# Patient Record
Sex: Male | Born: 1960 | Race: Black or African American | Hispanic: No | Smoking: Current every day smoker
Health system: Southern US, Community
[De-identification: ages and names within clinical notes are randomized; demographics above are authoritative.]

## PROBLEM LIST (undated history)

## (undated) DIAGNOSIS — I499 Cardiac arrhythmia, unspecified: Secondary | ICD-10-CM

## (undated) DIAGNOSIS — I513 Intracardiac thrombosis, not elsewhere classified: Secondary | ICD-10-CM

## (undated) DIAGNOSIS — I219 Acute myocardial infarction, unspecified: Secondary | ICD-10-CM

## (undated) DIAGNOSIS — F1010 Alcohol abuse, uncomplicated: Secondary | ICD-10-CM

## (undated) DIAGNOSIS — J449 Chronic obstructive pulmonary disease, unspecified: Secondary | ICD-10-CM

## (undated) DIAGNOSIS — Z5900 Homelessness unspecified: Secondary | ICD-10-CM

## (undated) DIAGNOSIS — I10 Essential (primary) hypertension: Secondary | ICD-10-CM

## (undated) DIAGNOSIS — J45909 Unspecified asthma, uncomplicated: Secondary | ICD-10-CM

## (undated) DIAGNOSIS — Z91199 Patient's noncompliance with other medical treatment and regimen due to unspecified reason: Secondary | ICD-10-CM

## (undated) DIAGNOSIS — N189 Chronic kidney disease, unspecified: Secondary | ICD-10-CM

## (undated) DIAGNOSIS — N183 Chronic kidney disease, stage 3 unspecified: Secondary | ICD-10-CM

## (undated) DIAGNOSIS — M1990 Unspecified osteoarthritis, unspecified site: Secondary | ICD-10-CM

## (undated) DIAGNOSIS — I4729 Other ventricular tachycardia: Secondary | ICD-10-CM

## (undated) DIAGNOSIS — F1410 Cocaine abuse, uncomplicated: Secondary | ICD-10-CM

## (undated) DIAGNOSIS — I272 Pulmonary hypertension, unspecified: Secondary | ICD-10-CM

## (undated) DIAGNOSIS — M792 Neuralgia and neuritis, unspecified: Secondary | ICD-10-CM

## (undated) DIAGNOSIS — F259 Schizoaffective disorder, unspecified: Secondary | ICD-10-CM

## (undated) DIAGNOSIS — I509 Heart failure, unspecified: Secondary | ICD-10-CM

## (undated) DIAGNOSIS — Z9119 Patient's noncompliance with other medical treatment and regimen: Secondary | ICD-10-CM

## (undated) DIAGNOSIS — I50810 Right heart failure, unspecified: Secondary | ICD-10-CM

## (undated) DIAGNOSIS — K219 Gastro-esophageal reflux disease without esophagitis: Secondary | ICD-10-CM

## (undated) DIAGNOSIS — I3139 Other pericardial effusion (noninflammatory): Secondary | ICD-10-CM

## (undated) DIAGNOSIS — I313 Pericardial effusion (noninflammatory): Secondary | ICD-10-CM

## (undated) DIAGNOSIS — M549 Dorsalgia, unspecified: Secondary | ICD-10-CM

## (undated) DIAGNOSIS — J40 Bronchitis, not specified as acute or chronic: Secondary | ICD-10-CM

## (undated) DIAGNOSIS — I472 Ventricular tachycardia: Secondary | ICD-10-CM

## (undated) DIAGNOSIS — F250 Schizoaffective disorder, bipolar type: Secondary | ICD-10-CM

## (undated) HISTORY — PX: OTHER SURGICAL HISTORY: SHX169

---

## 1999-07-02 ENCOUNTER — Emergency Department (HOSPITAL_COMMUNITY): Admission: EM | Admit: 1999-07-02 | Discharge: 1999-07-02 | Payer: Self-pay | Admitting: Emergency Medicine

## 2001-02-04 ENCOUNTER — Emergency Department (HOSPITAL_COMMUNITY): Admission: EM | Admit: 2001-02-04 | Discharge: 2001-02-04 | Payer: Self-pay

## 2007-09-30 ENCOUNTER — Emergency Department (HOSPITAL_COMMUNITY): Admission: EM | Admit: 2007-09-30 | Discharge: 2007-09-30 | Payer: Self-pay | Admitting: Emergency Medicine

## 2007-10-09 ENCOUNTER — Emergency Department (HOSPITAL_COMMUNITY): Admission: EM | Admit: 2007-10-09 | Discharge: 2007-10-09 | Payer: Self-pay | Admitting: Family Medicine

## 2008-01-16 ENCOUNTER — Emergency Department (HOSPITAL_COMMUNITY): Admission: EM | Admit: 2008-01-16 | Discharge: 2008-01-17 | Payer: Self-pay | Admitting: Emergency Medicine

## 2008-07-31 ENCOUNTER — Emergency Department (HOSPITAL_COMMUNITY): Admission: EM | Admit: 2008-07-31 | Discharge: 2008-07-31 | Payer: Self-pay | Admitting: Emergency Medicine

## 2009-10-01 ENCOUNTER — Emergency Department (HOSPITAL_COMMUNITY): Admission: EM | Admit: 2009-10-01 | Discharge: 2009-10-01 | Payer: Self-pay | Admitting: Family Medicine

## 2009-11-23 ENCOUNTER — Emergency Department (HOSPITAL_COMMUNITY): Admission: EM | Admit: 2009-11-23 | Discharge: 2009-11-23 | Payer: Self-pay | Admitting: Emergency Medicine

## 2010-07-21 LAB — PROTIME-INR: INR: 1 (ref 0.00–1.49)

## 2010-07-21 LAB — POCT I-STAT, CHEM 8
BUN: 14 mg/dL (ref 6–23)
Chloride: 104 mEq/L (ref 96–112)
Glucose, Bld: 90 mg/dL (ref 70–99)
HCT: 53 % — ABNORMAL HIGH (ref 39.0–52.0)
Hemoglobin: 18 g/dL — ABNORMAL HIGH (ref 13.0–17.0)
Potassium: 3.8 mEq/L (ref 3.5–5.1)
TCO2: 27 mmol/L (ref 0–100)

## 2010-07-21 LAB — COMPREHENSIVE METABOLIC PANEL
AST: 37 U/L (ref 0–37)
Calcium: 10 mg/dL (ref 8.4–10.5)
Chloride: 100 mEq/L (ref 96–112)
Creatinine, Ser: 1.3 mg/dL (ref 0.4–1.5)
GFR calc Af Amer: >60 mL/min (ref >60)
GFR calc non Af Amer: 59 mL/min — ABNORMAL LOW (ref >60)
Sodium: 139 mEq/L (ref 135–145)

## 2010-07-21 LAB — DIFFERENTIAL
Basophils Relative: 0 % (ref 0–1)
Eosinophils Absolute: 0.3 10*3/uL (ref 0.0–0.7)
Eosinophils Relative: 5 % (ref 0–5)
Lymphocytes Relative: 36 % (ref 12–46)
Lymphs Abs: 2.3 10*3/uL (ref 0.7–4.0)
Monocytes Relative: 7 % (ref 3–12)

## 2010-07-21 LAB — POCT CARDIAC MARKERS

## 2010-07-21 LAB — D-DIMER, QUANTITATIVE: D-Dimer, Quant: 0.33 ug/mL-FEU (ref 0.00–0.48)

## 2010-07-21 LAB — CBC
Hemoglobin: 16.8 g/dL (ref 13.0–17.0)
MCHC: 34.7 g/dL (ref 30.0–36.0)
MCV: 87.2 fL (ref 78.0–100.0)
RBC: 5.55 MIL/uL (ref 4.22–5.81)
RDW: 13.9 % (ref 11.5–15.5)

## 2011-01-06 LAB — DIFFERENTIAL
Basophils Absolute: 0
Eosinophils Absolute: 0.1
Lymphocytes Relative: 11 — ABNORMAL LOW
Lymphs Abs: 1.8
Monocytes Absolute: 1.6 — ABNORMAL HIGH
Monocytes Relative: 10
Neutrophils Relative %: 79 — ABNORMAL HIGH

## 2011-01-06 LAB — URINE CULTURE
Colony Count: NO GROWTH
Culture: NO GROWTH

## 2011-01-06 LAB — POCT URINALYSIS DIP (DEVICE)
Glucose, UA: NEGATIVE
Ketones, ur: NEGATIVE
Operator id: 270961
pH: 7

## 2011-01-06 LAB — POCT I-STAT, CHEM 8
HCT: 52
Potassium: 4.1
Sodium: 136

## 2011-01-06 LAB — CBC
HCT: 47.9
MCHC: 33.3
RDW: 13.5
WBC: 17 — ABNORMAL HIGH

## 2011-01-10 LAB — POCT I-STAT, CHEM 8
BUN: 8
Glucose, Bld: 82
HCT: 52

## 2012-01-31 ENCOUNTER — Encounter (HOSPITAL_COMMUNITY): Payer: Self-pay | Admitting: Adult Health

## 2012-01-31 ENCOUNTER — Emergency Department (HOSPITAL_COMMUNITY): Payer: Self-pay

## 2012-01-31 ENCOUNTER — Emergency Department (HOSPITAL_COMMUNITY)
Admission: EM | Admit: 2012-01-31 | Discharge: 2012-01-31 | Disposition: A | Discharge status: Home / Self Care | Payer: Self-pay | Attending: Emergency Medicine | Admitting: Emergency Medicine

## 2012-01-31 DIAGNOSIS — R319 Hematuria, unspecified: Secondary | ICD-10-CM | POA: Insufficient documentation

## 2012-01-31 DIAGNOSIS — F17200 Nicotine dependence, unspecified, uncomplicated: Secondary | ICD-10-CM | POA: Insufficient documentation

## 2012-01-31 DIAGNOSIS — R079 Chest pain, unspecified: Secondary | ICD-10-CM | POA: Insufficient documentation

## 2012-01-31 DIAGNOSIS — R109 Unspecified abdominal pain: Secondary | ICD-10-CM | POA: Insufficient documentation

## 2012-01-31 HISTORY — DX: Cardiac arrhythmia, unspecified: I49.9

## 2012-01-31 LAB — HEPATIC FUNCTION PANEL
ALT: 22 U/L (ref 0–53)
Albumin: 3.9 g/dL (ref 3.5–5.2)
Alkaline Phosphatase: 57 U/L (ref 39–117)
Total Protein: 6.9 g/dL (ref 6.0–8.3)

## 2012-01-31 LAB — URINALYSIS, ROUTINE W REFLEX MICROSCOPIC
Bilirubin Urine: NEGATIVE
Nitrite: NEGATIVE
Specific Gravity, Urine: 1.024 (ref 1.005–1.030)
pH: 6.5 (ref 5.0–8.0)

## 2012-01-31 LAB — BASIC METABOLIC PANEL
BUN: 15 mg/dL (ref 6–23)
GFR calc Af Amer: 70 mL/min — ABNORMAL LOW (ref >90)
GFR calc non Af Amer: 60 mL/min — ABNORMAL LOW (ref >90)
Glucose, Bld: 93 mg/dL (ref 70–99)
Sodium: 140 mEq/L (ref 135–145)

## 2012-01-31 LAB — CBC
HCT: 42.6 % (ref 39.0–52.0)
Hemoglobin: 15 g/dL (ref 13.0–17.0)
MCH: 30.8 pg (ref 26.0–34.0)
MCHC: 35.2 g/dL (ref 30.0–36.0)
MCV: 87.5 fL (ref 78.0–100.0)
Platelets: 191 10*3/uL (ref 150–400)
WBC: 5.9 10*3/uL (ref 4.0–10.5)

## 2012-01-31 LAB — LIPASE, BLOOD: Lipase: 31 U/L (ref 11–59)

## 2012-01-31 LAB — URINE MICROSCOPIC-ADD ON

## 2012-01-31 LAB — POCT I-STAT TROPONIN I: Troponin i, poc: 0.04 ng/mL (ref 0.00–0.08)

## 2012-01-31 MED ORDER — ASPIRIN 325 MG PO TABS
325.0000 mg | ORAL_TABLET | ORAL | Status: AC
  Administered 2012-01-31: 325 mg via ORAL
  Filled 2012-01-31: qty 1

## 2012-01-31 MED ORDER — POLYETHYLENE GLYCOL 3350 17 GM/SCOOP PO POWD: 17.0000 g | Freq: Every day | ORAL | Status: DC

## 2012-01-31 MED ORDER — GI COCKTAIL ~~LOC~~
30.0000 mL | Freq: Once | ORAL | Status: AC
  Administered 2012-01-31: 30 mL via ORAL
  Filled 2012-01-31: qty 30

## 2012-01-31 MED ORDER — NITROGLYCERIN 0.4 MG SL SUBL
0.4000 mg | SUBLINGUAL_TABLET | SUBLINGUAL | Status: DC | PRN
  Administered 2012-01-31: 0.4 mg via SUBLINGUAL
  Filled 2012-01-31: qty 75

## 2012-01-31 NOTE — ED Notes (Signed)
Reports chest pain associated with SOB, bilateral hand numbness described as tightness. Ongoing for a "long time" also c/o pain in left side. Chest pain is worse with exertion.

## 2012-01-31 NOTE — ED Provider Notes (Signed)
Medical screening examination/treatment/procedure(s) were conducted as a shared visit with non-physician practitioner(s) and myself.  I personally evaluated the patient during the encounter.  Pt with ongoing symptoms for some time, although abd pain is main reason for visit.  No signs of cardiac ischemia, no specific findings for abd pain, though patient noted to have large stool burden on CT scan.  Will refer to pcm locally, treat constipation.  Pt advised to stop using cocaine.  Olivia Mackie, MD 01/31/12 [phone]

## 2012-01-31 NOTE — ED Provider Notes (Signed)
History     CSN: [id number]  Arrival date & time 01/31/12  0234   First MD Initiated Contact with Patient 01/31/12 0239      Chief Complaint  Patient presents with  . Abdominal Pain  . Chest Pain   HPI  History provided by the patient. Patient is a 51 year old male with history of hypertension who presents with complaints of abdominal pain. Patient reports to me this is a pain reason for coming this evening to the emergency room. He also complains of chest pains that are episodic and chronic for more than one year. This evening patient states in addition to his similar episodic chronic chest pains he has pain across his upper abdomen and left lower abdomen area. Pain is worse in LLQ and can be very sharp.  These pains are more recent and also intermittent for the past 3 weeks. Patient does not associate any particular activity with pain. he has not used any treatments for pain. Pain is described as an ache and burning. Patient denies any associated nausea vomiting diarrhea or constipation. He denies any urinary changes. No dysuria, hematuria, urinary frequency or penile discharge.  Chest pain is described as brief intermittent pressures and sharp pains in the sternal and left chest area. Symptoms last only seconds. Patient states that when he feels these he sometimes gets up and moves around to help them go away. Patient reports that he was formally on diltiazem and other blood pressure medicines but has stopped taking these for the past year. Patient also admits to regular cocaine use and states that he smokes cocaine earlier today. He does not associate chest or abdominal pains with cocaine use and states that he has these even when he is not using drugs. Symptoms are occasionally accompanied with shortness of breath. He denies any heart palpitations, lightheadedness, near syncope, diaphoresis or nausea.    Past Medical History  Diagnosis Date  . Irregular heart beat     History  reviewed. No pertinent past surgical history.  History reviewed. No pertinent family history.  History  Substance Use Topics  . Smoking status: Current Every Day Smoker  . Smokeless tobacco: Not on file  . Alcohol Use: Yes      Review of Systems  Constitutional: Negative for fever, chills and diaphoresis.  Respiratory: Positive for shortness of breath. Negative for cough.   Cardiovascular: Positive for chest pain. Negative for palpitations and leg swelling.  Gastrointestinal: Positive for abdominal pain. Negative for nausea, vomiting, diarrhea, constipation and blood in stool.  Genitourinary: Negative for dysuria, frequency, hematuria, flank pain and discharge.  Musculoskeletal: Negative for back pain.  Skin: Negative for rash.  Neurological: Negative for light-headedness and headaches.    Allergies  Review of patient's allergies indicates no known allergies.  Home Medications  No current outpatient prescriptions on file.  BP 142/84  Pulse 85  Temp 97.8 F (36.6 C) (Oral)  Resp 16  SpO2 95%  Physical Exam  Nursing note and vitals reviewed. Constitutional: He is oriented to person, place, and time. He appears well-developed and well-nourished. No distress.  HENT:  Head: Normocephalic.  Cardiovascular: Normal rate and regular rhythm.   No murmur heard. Pulmonary/Chest: Effort normal and breath sounds normal. No respiratory distress. He has no wheezes. He has no rales. He exhibits no tenderness.  Abdominal: Soft. There is tenderness in the right upper quadrant, epigastric area, left upper quadrant and left lower quadrant. There is no rigidity, no rebound, no guarding, no CVA tenderness  and negative Murphy's sign.       Tenderness is mild slightly worse in LLQ.  No peritoneal signs.  Musculoskeletal: Normal range of motion. He exhibits no edema and no tenderness.  Neurological: He is alert and oriented to person, place, and time.  Skin: Skin is warm and dry. He is not  diaphoretic.  Psychiatric: He has a normal mood and affect. His behavior is normal.    ED Course  Procedures  Results for orders placed during the hospital encounter of 01/31/12  CBC      Component Value Range   WBC 5.9  4.0 - 10.5 K/uL   RBC 4.87  4.22 - 5.81 MIL/uL   Hemoglobin 15.0  13.0 - 17.0 g/dL   HCT 21.3  08.6 - 57.8 %   MCV 87.5  78.0 - 100.0 fL   MCH 30.8  26.0 - 34.0 pg   MCHC 35.2  30.0 - 36.0 g/dL   RDW 46.9  62.9 - 52.8 %   Platelets 191  150 - 400 K/uL  BASIC METABOLIC PANEL      Component Value Range   Sodium 140  135 - 145 mEq/L   Potassium 3.9  3.5 - 5.1 mEq/L   Chloride 100  96 - 112 mEq/L   CO2 28  19 - 32 mEq/L   Glucose, Bld 93  70 - 99 mg/dL   BUN 15  6 - 23 mg/dL   Creatinine, Ser 4.13  0.50 - 1.35 mg/dL   Calcium 9.2  8.4 - 24.4 mg/dL   GFR calc non Af Amer 60 (*) >90 mL/min   GFR calc Af Amer 70 (*) >90 mL/min  POCT I-STAT TROPONIN I      Component Value Range   Troponin i, poc 0.02  0.00 - 0.08 ng/mL   Comment 3           HEPATIC FUNCTION PANEL      Component Value Range   Total Protein 6.9  6.0 - 8.3 g/dL   Albumin 3.9  3.5 - 5.2 g/dL   AST 28  0 - 37 U/L   ALT 22  0 - 53 U/L   Alkaline Phosphatase 57  39 - 117 U/L   Total Bilirubin 0.6  0.3 - 1.2 mg/dL   Bilirubin, Direct 0.2  0.0 - 0.3 mg/dL   Indirect Bilirubin 0.4  0.3 - 0.9 mg/dL  LIPASE, BLOOD      Component Value Range   Lipase 31  11 - 59 U/L  URINALYSIS, ROUTINE W REFLEX MICROSCOPIC      Component Value Range   Color, Urine YELLOW  YELLOW   APPearance CLEAR  CLEAR   Specific Gravity, Urine 1.024  1.005 - 1.030   pH 6.5  5.0 - 8.0   Glucose, UA NEGATIVE  NEGATIVE mg/dL   Hgb urine dipstick MODERATE (*) NEGATIVE   Bilirubin Urine NEGATIVE  NEGATIVE   Ketones, ur NEGATIVE  NEGATIVE mg/dL   Protein, ur NEGATIVE  NEGATIVE mg/dL   Urobilinogen, UA 1.0  0.0 - 1.0 mg/dL   Nitrite NEGATIVE  NEGATIVE   Leukocytes, UA NEGATIVE  NEGATIVE  URINE MICROSCOPIC-ADD ON      Component  Value Range   RBC / HPF 7-10  <3 RBC/hpf    Second troponin negative at 0.04 ng/mL    Dg Chest [facility] 1 View  01/31/2012  *RADIOLOGY REPORT*  Clinical Data: Chest pain  PORTABLE CHEST - 1 VIEW  Comparison: 07/31/2008  Findings:  Degraded by rotation/left extremity positioning.  The tip of the apices are excluded from the exam.  Allowing for this, lungs are clear. No pleural effusion or pneumothorax. The cardiomediastinal contours are within normal limits. The visualized bones and soft tissues are without significant appreciable abnormality.  IMPRESSION: No radiographic evidence of acute cardiopulmonary process.   Original Report Authenticated By: Waneta Martins, M.D.      1. Abdominal pain   2. Hematuria   3. Chest pain       MDM   patient seen and evaluated. Patient resting comfortably in no acute distress. Symptoms have been intermittent and ongoing prolonged period of time. Patient offered medications for comfort and declined.  Pt continues to be resting well.  Labs have been unremarkable.  Pt does have slight hematuria.  On re- questioning pt does reports occasional left flank pains.  No prior hx of kidney stone.      Date: 01/31/2012  Rate: 88  Rhythm: normal sinus rhythm  QRS Axis: normal  Intervals: normal  ST/T Wave abnormalities: nonspecific T wave changes  Conduction Disutrbances:none  Narrative Interpretation:   Old EKG Reviewed: unchanged from 07/31/2008       Angus Seller, PA 01/31/12 [phone]

## 2012-05-08 ENCOUNTER — Encounter (HOSPITAL_COMMUNITY): Payer: Self-pay

## 2012-05-08 ENCOUNTER — Emergency Department (INDEPENDENT_AMBULATORY_CARE_PROVIDER_SITE_OTHER)
Admission: EM | Admit: 2012-05-08 | Discharge: 2012-05-08 | Disposition: A | Discharge status: Home / Self Care | Payer: Self-pay | Source: Home / Self Care | Attending: Family Medicine | Admitting: Family Medicine

## 2012-05-08 DIAGNOSIS — M479 Spondylosis, unspecified: Secondary | ICD-10-CM

## 2012-05-08 MED ORDER — KETOROLAC TROMETHAMINE 30 MG/ML IJ SOLN
INTRAMUSCULAR | Status: AC
  Filled 2012-05-08: qty 1

## 2012-05-08 MED ORDER — KETOROLAC TROMETHAMINE 30 MG/ML IJ SOLN
30.0000 mg | Freq: Once | INTRAMUSCULAR | Status: AC
  Administered 2012-05-08: 30 mg via INTRAMUSCULAR

## 2012-05-08 MED ORDER — MELOXICAM 7.5 MG PO TABS: 7.5000 mg | ORAL_TABLET | Freq: Two times a day (BID) | ORAL | Status: DC

## 2012-05-08 NOTE — ED Provider Notes (Signed)
History     CSN: [id number]  Arrival date & time 05/08/12  1314   First MD Initiated Contact with Patient 05/08/12 1416      Chief Complaint  Patient presents with  . Back Pain    (Consider location/radiation/quality/duration/timing/severity/associated sxs/prior treatment) Patient is a 52 y.o. male presenting with back pain. The history is provided by the patient.  Back Pain  This is a chronic problem. The current episode started more than 2 days ago. The problem has been gradually worsening. The pain is associated with no known injury. The pain is present in the lumbar spine. The pain is moderate. The symptoms are aggravated by bending and certain positions. Pertinent negatives include no dysuria and no pelvic pain.    Past Medical History  Diagnosis Date  . Irregular heart beat     History reviewed. No pertinent past surgical history.  History reviewed. No pertinent family history.  History  Substance Use Topics  . Smoking status: Current Every Day Smoker  . Smokeless tobacco: Not on file  . Alcohol Use: Yes      Review of Systems  Constitutional: Negative.   Gastrointestinal: Negative.   Genitourinary: Negative.  Negative for dysuria and pelvic pain.  Musculoskeletal: Positive for back pain. Negative for myalgias, joint swelling and gait problem.    Allergies  Review of patient's allergies indicates no known allergies.  Home Medications   Current Outpatient Rx  Name  Route  Sig  Dispense  Refill  . ALBUTEROL SULFATE HFA 108 (90 BASE) MCG/ACT IN AERS   Inhalation   Inhale 2 puffs into the lungs every 6 (six) hours as needed.         Marland Kitchen DICLOFENAC SODIUM 75 MG PO TBEC   Oral   Take 75 mg by mouth 2 (two) times daily.         Marland Kitchen DILTIAZEM HCL ER 180 MG PO CP24   Oral   Take 180 mg by mouth daily.         Marland Kitchen HYDROCHLOROTHIAZIDE 25 MG PO TABS   Oral   Take 25 mg by mouth daily.         Marland Kitchen LORATADINE 10 MG PO TBDP   Oral   Take 10 mg by mouth  daily.         . MELOXICAM 7.5 MG PO TABS   Oral   Take 1 tablet (7.5 mg total) by mouth 2 (two) times daily. For back pain   30 tablet   1   . POLYETHYLENE GLYCOL 3350 PO POWD   Oral   Take 17 g by mouth daily.   255 g   0   . RANITIDINE HCL 75 MG PO TABS   Oral   Take 75 mg by mouth 2 (two) times daily.           BP 120/67  Pulse 72  Temp 97.6 F (36.4 C) (Oral)  Resp 16  SpO2 100%  Physical Exam  Nursing note and vitals reviewed. Constitutional: He is oriented to person, place, and time. He appears well-developed and well-nourished.  Abdominal: Soft. Bowel sounds are normal.  Musculoskeletal: He exhibits tenderness.       Lumbar back: He exhibits decreased range of motion, tenderness, bony tenderness, pain and spasm.  Neurological: He is alert and oriented to person, place, and time.  Skin: Skin is warm and dry.    ED Course  Procedures (including critical care time)  Labs Reviewed - No data to  display No results found.   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

## 2012-05-08 NOTE — ED Notes (Signed)
Ongoing problems w back, worse recently

## 2014-11-06 ENCOUNTER — Emergency Department (HOSPITAL_COMMUNITY): Payer: Self-pay

## 2014-11-06 ENCOUNTER — Encounter (HOSPITAL_COMMUNITY): Payer: Self-pay | Admitting: Vascular Surgery

## 2014-11-06 ENCOUNTER — Emergency Department (HOSPITAL_COMMUNITY)
Admission: EM | Admit: 2014-11-06 | Discharge: 2014-11-07 | Disposition: A | Discharge status: Home / Self Care | Payer: Self-pay | Attending: Emergency Medicine | Admitting: Emergency Medicine

## 2014-11-06 DIAGNOSIS — M25569 Pain in unspecified knee: Secondary | ICD-10-CM

## 2014-11-06 DIAGNOSIS — M542 Cervicalgia: Secondary | ICD-10-CM | POA: Insufficient documentation

## 2014-11-06 DIAGNOSIS — M25552 Pain in left hip: Secondary | ICD-10-CM | POA: Insufficient documentation

## 2014-11-06 DIAGNOSIS — M25561 Pain in right knee: Secondary | ICD-10-CM | POA: Insufficient documentation

## 2014-11-06 DIAGNOSIS — M25559 Pain in unspecified hip: Secondary | ICD-10-CM

## 2014-11-06 DIAGNOSIS — R2243 Localized swelling, mass and lump, lower limb, bilateral: Secondary | ICD-10-CM | POA: Insufficient documentation

## 2014-11-06 DIAGNOSIS — M792 Neuralgia and neuritis, unspecified: Secondary | ICD-10-CM | POA: Insufficient documentation

## 2014-11-06 DIAGNOSIS — M25512 Pain in left shoulder: Secondary | ICD-10-CM | POA: Insufficient documentation

## 2014-11-06 DIAGNOSIS — I2510 Atherosclerotic heart disease of native coronary artery without angina pectoris: Secondary | ICD-10-CM | POA: Insufficient documentation

## 2014-11-06 DIAGNOSIS — M545 Low back pain: Secondary | ICD-10-CM | POA: Insufficient documentation

## 2014-11-06 DIAGNOSIS — G8929 Other chronic pain: Secondary | ICD-10-CM | POA: Insufficient documentation

## 2014-11-06 DIAGNOSIS — M25551 Pain in right hip: Secondary | ICD-10-CM | POA: Insufficient documentation

## 2014-11-06 DIAGNOSIS — M1990 Unspecified osteoarthritis, unspecified site: Secondary | ICD-10-CM | POA: Insufficient documentation

## 2014-11-06 DIAGNOSIS — M25562 Pain in left knee: Secondary | ICD-10-CM | POA: Insufficient documentation

## 2014-11-06 DIAGNOSIS — J441 Chronic obstructive pulmonary disease with (acute) exacerbation: Secondary | ICD-10-CM | POA: Insufficient documentation

## 2014-11-06 DIAGNOSIS — I10 Essential (primary) hypertension: Secondary | ICD-10-CM | POA: Insufficient documentation

## 2014-11-06 DIAGNOSIS — Z79899 Other long term (current) drug therapy: Secondary | ICD-10-CM | POA: Insufficient documentation

## 2014-11-06 DIAGNOSIS — Z720 Tobacco use: Secondary | ICD-10-CM | POA: Insufficient documentation

## 2014-11-06 DIAGNOSIS — Z8719 Personal history of other diseases of the digestive system: Secondary | ICD-10-CM | POA: Insufficient documentation

## 2014-11-06 HISTORY — DX: Unspecified osteoarthritis, unspecified site: M19.90

## 2014-11-06 HISTORY — DX: Chronic obstructive pulmonary disease, unspecified: J44.9

## 2014-11-06 HISTORY — DX: Essential (primary) hypertension: I10

## 2014-11-06 HISTORY — DX: Unspecified asthma, uncomplicated: J45.909

## 2014-11-06 HISTORY — DX: Dorsalgia, unspecified: M54.9

## 2014-11-06 HISTORY — DX: Bronchitis, not specified as acute or chronic: J40

## 2014-11-06 HISTORY — DX: Neuralgia and neuritis, unspecified: M79.2

## 2014-11-06 HISTORY — DX: Gastro-esophageal reflux disease without esophagitis: K21.9

## 2014-11-06 MED ORDER — ALBUTEROL SULFATE (2.5 MG/3ML) 0.083% IN NEBU
5.0000 mg | INHALATION_SOLUTION | Freq: Once | RESPIRATORY_TRACT | Status: AC
  Administered 2014-11-06: 5 mg via RESPIRATORY_TRACT

## 2014-11-06 MED ORDER — ALBUTEROL SULFATE (2.5 MG/3ML) 0.083% IN NEBU
INHALATION_SOLUTION | RESPIRATORY_TRACT | Status: AC
  Filled 2014-11-06: qty 3

## 2014-11-06 NOTE — ED Notes (Signed)
Pt reports to the ED for eval of lower back pain, bilateral knee pain, and bilateral toe tingling. Pt also reports left shoulder pain. He reports that when he turns his head to the left a sharp pain will shoot up his left neck and head. He reports he does not have cartilage in his knees. Slight swelling noted bilaterally. Pt has a hx of DDD. Denies any hx of DM. Pt reports the SOB occurs with walking and at rest. He reports he is on 2 inhalers but they are not helping. Pt denies any CP at this time. Pt A&Ox4, resp e/u, and skin warm and dry.

## 2014-11-07 MED ORDER — OXYCODONE-ACETAMINOPHEN 5-325 MG PO TABS
1.0000 | ORAL_TABLET | Freq: Once | ORAL | Status: AC
  Administered 2014-11-07: 1 via ORAL
  Filled 2014-11-07: qty 1

## 2014-11-07 MED ORDER — OXYCODONE-ACETAMINOPHEN 5-325 MG PO TABS: 1.0000 | ORAL_TABLET | ORAL | Status: DC | PRN

## 2014-11-07 NOTE — ED Provider Notes (Signed)
CSN: [id number]     Arrival date & time 11/06/14  2049 History   First MD Initiated Contact with Patient 11/06/14 2342     Chief Complaint  Patient presents with  . Back Pain  . Shortness of Breath     (Consider location/radiation/quality/duration/timing/severity/associated sxs/prior Treatment) Patient is a 54 y.o. male presenting with back pain and shortness of breath. The history is provided by the patient. No language interpreter was used.  Back Pain Location:  Lumbar spine Quality:  Aching Radiates to:  L foot and R foot Pain severity:  Moderate Pain is:  Same all the time Onset quality:  Gradual Associated symptoms: no abdominal pain, no fever, no numbness and no weakness   Associated symptoms comment:  Pain in lower back radiating into both feet. No new injury. He is also having pain and swelling in bilateral knees, "arthritis". He is also having pain in the left shoulder, starting at the left cervical area. No swelling or redness. He denies fever, nausea, vomiting, weakness or numbness. He is being treated by his PCP with neurontin but states this does not help. All pain complaints are chronic and nothing new tonight. He states he just needed something more for pain. Shortness of Breath Associated symptoms: no abdominal pain, no fever and no vomiting     Past Medical History  Diagnosis Date  . Irregular heart beat   . Acid reflux   . Hypertension   . COPD (chronic obstructive pulmonary disease)   . Arthritis   . Neuropathic pain   . Coronary artery disease   . Asthma   . Bronchitis   . Back pain    History reviewed. No pertinent past surgical history. No family history on file. History  Substance Use Topics  . Smoking status: Current Every Day Smoker  . Smokeless tobacco: Not on file  . Alcohol Use: Yes    Review of Systems  Constitutional: Negative for fever and chills.  Respiratory: Positive for shortness of breath.   Cardiovascular: Negative.    Gastrointestinal: Negative.  Negative for nausea, vomiting and abdominal pain.  Genitourinary: Negative.  Negative for enuresis.  Musculoskeletal: Positive for back pain, joint swelling and arthralgias.  Skin: Negative.  Negative for wound.  Neurological: Negative.  Negative for weakness and numbness.      Allergies  Review of patient's allergies indicates no known allergies.  Home Medications   Prior to Admission medications   Medication Sig Start Date End Date Taking? Authorizing Provider  benztropine (COGENTIN) 1 MG tablet Take 1 mg by mouth at bedtime.   Yes Historical Provider, MD  citalopram (CELEXA) 20 MG tablet Take 20 mg by mouth daily.   Yes Historical Provider, MD  colchicine 0.6 MG tablet Take 0.6 mg by mouth daily.   Yes Historical Provider, MD  gabapentin (NEURONTIN) 800 MG tablet Take 800 mg by mouth at bedtime.   Yes Historical Provider, MD  paliperidone (INVEGA) 6 MG 24 hr tablet Take 6 mg by mouth daily.   Yes Historical Provider, MD  traZODone (DESYREL) 100 MG tablet Take 100 mg by mouth 2 (two) times daily.   Yes Historical Provider, MD  meloxicam (MOBIC) 7.5 MG tablet Take 1 tablet (7.5 mg total) by mouth 2 (two) times daily. For back pain Patient not taking: Reported on 11/07/2014 05/08/12   Linna Hoff, MD  polyethylene glycol powder (GLYCOLAX/MIRALAX) powder Take 17 g by mouth daily. Patient not taking: Reported on 11/07/2014 01/31/12   Ivonne Andrew, PA-C  BP 151/71 mmHg  Pulse 72  Temp(Src) 98.5 F (36.9 C) (Oral)  Resp 16  SpO2 97% Physical Exam  Constitutional: He is oriented to person, place, and time. He appears well-developed and well-nourished.  HENT:  Head: Normocephalic.  Neck: Normal range of motion. Neck supple.  Cardiovascular: Normal rate and regular rhythm.   Pulmonary/Chest: Effort normal and breath sounds normal.  Abdominal: Soft. Bowel sounds are normal. There is no tenderness. There is no rebound and no guarding.  Musculoskeletal:  Normal range of motion.  No midline cervical tenderness. There is mild left paracervical into left superior shoulder tenderness without swelling or redness. Bilateral knee swelling without effusion. Joints stable. No redness. Lower back tender across lumbar area without swelling. Ambulatory without difficulty.   Neurological: He is alert and oriented to person, place, and time.  Skin: Skin is warm and dry. No rash noted.  Psychiatric: He has a normal mood and affect.    ED Course  Procedures (including critical care time) Labs Review Labs Reviewed - No data to display  Imaging Review Dg Chest 2 View  11/06/2014   CLINICAL DATA:  A shortness of breath and hypertension  EXAM: CHEST  2 VIEW  COMPARISON:  January 31, 2012  FINDINGS: The heart size and mediastinal contours are within normal limits. There is no focal infiltrate, pulmonary edema, or pleural effusion. The visualized skeletal structures are unremarkable.  IMPRESSION: No active cardiopulmonary disease.   Electronically Signed   By: Sherian Rein M.D.   On: 11/06/2014 21:35     EKG Interpretation None      MDM   Final diagnoses:  None    1. Arthralgias 2. History of arthritis  The patient is well appearing. No evidence to suggest septic joints or new process. He is encouraged to follow up with PCP to discuss pain management.   The patient reports, on re-evaluation, that he has had a cough. CXR without acute finding. No hypoxia. Lungs without abnormal breath sounds. Stable.     Elpidio Anis, PA-C 11/07/14 0036  Dione Booze, MD 11/07/14 [phone]

## 2014-11-07 NOTE — Discharge Instructions (Signed)

## 2014-11-12 ENCOUNTER — Ambulatory Visit: Payer: Self-pay | Admitting: Cardiology

## 2014-11-19 ENCOUNTER — Encounter: Payer: Self-pay | Admitting: Cardiology

## 2014-11-19 ENCOUNTER — Ambulatory Visit: Payer: Self-pay | Attending: Cardiology | Admitting: Cardiology

## 2014-11-19 VITALS — BP 138/83 | HR 83 | Temp 97.9°F | Resp 18 | Ht 71.0 in | Wt 208.4 lb

## 2014-11-19 DIAGNOSIS — R062 Wheezing: Secondary | ICD-10-CM | POA: Insufficient documentation

## 2014-11-19 DIAGNOSIS — I10 Essential (primary) hypertension: Secondary | ICD-10-CM | POA: Insufficient documentation

## 2014-11-19 DIAGNOSIS — R05 Cough: Secondary | ICD-10-CM | POA: Insufficient documentation

## 2014-11-19 DIAGNOSIS — F17200 Nicotine dependence, unspecified, uncomplicated: Secondary | ICD-10-CM | POA: Insufficient documentation

## 2014-11-19 DIAGNOSIS — I25119 Atherosclerotic heart disease of native coronary artery with unspecified angina pectoris: Secondary | ICD-10-CM | POA: Insufficient documentation

## 2014-11-19 DIAGNOSIS — Z720 Tobacco use: Secondary | ICD-10-CM | POA: Insufficient documentation

## 2014-11-19 NOTE — Patient Instructions (Signed)
Thank you for coming in to see Dr. Daleen Squibb today.  Please start taking Aspirin 81mg  daily. Stop smoking. Keep taking nitroglycerin as discussed.

## 2014-11-19 NOTE — Assessment & Plan Note (Signed)
With his multiple risk factors especially heavy smoking and symptoms consistent with angina he most likely has coronary artery disease. I've asked him to take aspirin 81 mg a day, quit smoking altogether, and how to take nitroglycerin if he has recurrent symptoms. He knows that if he takes 3 nitroglycerin with no relief to call 911. Secondary preventative risk factors need to be addressed. I'll arrange him to come back and be put in the internal medicine clinic.

## 2014-11-19 NOTE — Progress Notes (Signed)
Patient referred by ED for SOB. Patient reports pain today located in lower back and knees. Pain rated at a 10, described as "just pain and aching to the bone." Pain is constant.   Patient has not taken any medications today.   Patient smokes .5 packs/cigs daily.   Patient has been having chest pain, SOB, wheezing, and swelling in ankles, top of feet, knees and back. SOB is present at rest and when active. Patient reports wheezing is always present. Patient reports he is on an albuterol inhaler and qvar but does not know the dosage.

## 2014-11-19 NOTE — Progress Notes (Signed)
Allen Mayer is referred by the emergency room for the evaluation of the regular heartbeats. I reviewed his EKG and other records and I do not see any evidence of this. [facility] is [facility] that he had heart problems and irregular heartbeat. He denies any syncope. He does have occasional chest tightness with some tingling in his left arm. He is a very difficult historian.  He's been told to take 81 mg of aspirin per day and quit smoking. He is doing neither. He does have an occasional pinching sensation in his chest but no palpitations.  His EKG in the emergency room showed normal sinus rhythm with nonspecific ST segment changes and borderline LVH.  His exam today shows me no acute distress. He's got a chronic cough with productive clear sputum. He is wheezing audibly. Vital signs are recorded. Neck shows no JVD with no carotid bruits. Heart reveals a soft S1-S2 without obvious murmur or gallop. Lungs reveal inspiratory expiratory rhonchi. They clear somewhat with cough. Abdominal exam is soft with good bowel sounds. Extremities reveal good pulses and no edema.

## 2014-11-19 NOTE — Assessment & Plan Note (Signed)
Strongly advised to quit smoking. I've told him that he is at significant risk of having a myocardial infarction if he continues to smoke.

## 2014-11-19 NOTE — Assessment & Plan Note (Signed)
Patient will continue his lisinopril HCTZ. He needs to stop smoking. We'll schedule him for follow-up in the general medical clinic. We'll obtain a comprehensive metabolic profile and fasting lipids.

## 2015-02-01 ENCOUNTER — Emergency Department (HOSPITAL_COMMUNITY): Payer: Self-pay

## 2015-02-01 ENCOUNTER — Emergency Department (HOSPITAL_COMMUNITY)
Admission: EM | Admit: 2015-02-01 | Discharge: 2015-02-02 | Disposition: A | Discharge status: Home / Self Care | Payer: Self-pay | Attending: Emergency Medicine | Admitting: Emergency Medicine

## 2015-02-01 ENCOUNTER — Encounter (HOSPITAL_COMMUNITY): Payer: Self-pay | Admitting: Emergency Medicine

## 2015-02-01 DIAGNOSIS — Y998 Other external cause status: Secondary | ICD-10-CM | POA: Insufficient documentation

## 2015-02-01 DIAGNOSIS — S8991XA Unspecified injury of right lower leg, initial encounter: Secondary | ICD-10-CM | POA: Insufficient documentation

## 2015-02-01 DIAGNOSIS — Z79899 Other long term (current) drug therapy: Secondary | ICD-10-CM | POA: Insufficient documentation

## 2015-02-01 DIAGNOSIS — Z720 Tobacco use: Secondary | ICD-10-CM | POA: Insufficient documentation

## 2015-02-01 DIAGNOSIS — K219 Gastro-esophageal reflux disease without esophagitis: Secondary | ICD-10-CM | POA: Insufficient documentation

## 2015-02-01 DIAGNOSIS — K5900 Constipation, unspecified: Secondary | ICD-10-CM | POA: Insufficient documentation

## 2015-02-01 DIAGNOSIS — I2510 Atherosclerotic heart disease of native coronary artery without angina pectoris: Secondary | ICD-10-CM | POA: Insufficient documentation

## 2015-02-01 DIAGNOSIS — Y9389 Activity, other specified: Secondary | ICD-10-CM | POA: Insufficient documentation

## 2015-02-01 DIAGNOSIS — J449 Chronic obstructive pulmonary disease, unspecified: Secondary | ICD-10-CM | POA: Insufficient documentation

## 2015-02-01 DIAGNOSIS — I10 Essential (primary) hypertension: Secondary | ICD-10-CM | POA: Insufficient documentation

## 2015-02-01 DIAGNOSIS — R140 Abdominal distension (gaseous): Secondary | ICD-10-CM | POA: Insufficient documentation

## 2015-02-01 DIAGNOSIS — Y9289 Other specified places as the place of occurrence of the external cause: Secondary | ICD-10-CM | POA: Insufficient documentation

## 2015-02-01 DIAGNOSIS — S8992XA Unspecified injury of left lower leg, initial encounter: Secondary | ICD-10-CM | POA: Insufficient documentation

## 2015-02-01 DIAGNOSIS — W1839XA Other fall on same level, initial encounter: Secondary | ICD-10-CM | POA: Insufficient documentation

## 2015-02-01 DIAGNOSIS — M1990 Unspecified osteoarthritis, unspecified site: Secondary | ICD-10-CM | POA: Insufficient documentation

## 2015-02-01 LAB — CBC WITH DIFFERENTIAL/PLATELET
Basophils Absolute: 0 10*3/uL (ref 0.0–0.1)
Basophils Relative: 0 %
EOS ABS: 0.2 10*3/uL (ref 0.0–0.7)
EOS PCT: 4 %
HCT: 44 % (ref 39.0–52.0)
Hemoglobin: 14.7 g/dL (ref 13.0–17.0)
LYMPHS ABS: 2.3 10*3/uL (ref 0.7–4.0)
Lymphocytes Relative: 37 %
MCH: 30.1 pg (ref 26.0–34.0)
MCHC: 33.4 g/dL (ref 30.0–36.0)
MCV: 90 fL (ref 78.0–100.0)
MONO ABS: 0.6 10*3/uL (ref 0.1–1.0)
MONOS PCT: 10 %
Neutro Abs: 3.1 10*3/uL (ref 1.7–7.7)
Neutrophils Relative %: 49 %
PLATELETS: 216 10*3/uL (ref 150–400)
RBC: 4.89 MIL/uL (ref 4.22–5.81)
RDW: 13.8 % (ref 11.5–15.5)
WBC: 6.3 10*3/uL (ref 4.0–10.5)

## 2015-02-01 LAB — COMPREHENSIVE METABOLIC PANEL
ALT: 35 U/L (ref 17–63)
ANION GAP: 7 (ref 5–15)
AST: 34 U/L (ref 15–41)
Albumin: 4.1 g/dL (ref 3.5–5.0)
Alkaline Phosphatase: 58 U/L (ref 38–126)
BUN: 17 mg/dL (ref 6–20)
CHLORIDE: 102 mmol/L (ref 101–111)
CO2: 25 mmol/L (ref 22–32)
Calcium: 9.2 mg/dL (ref 8.9–10.3)
Creatinine, Ser: 1.1 mg/dL (ref 0.61–1.24)
Glucose, Bld: 143 mg/dL — ABNORMAL HIGH (ref 65–99)
POTASSIUM: 4 mmol/L (ref 3.5–5.1)
SODIUM: 134 mmol/L — AB (ref 135–145)
Total Bilirubin: 0.3 mg/dL (ref 0.3–1.2)
Total Protein: 7.3 g/dL (ref 6.5–8.1)

## 2015-02-01 LAB — LIPASE, BLOOD: LIPASE: 27 U/L (ref 11–51)

## 2015-02-01 MED ORDER — TRAMADOL HCL 50 MG PO TABS: 50.0000 mg | ORAL_TABLET | Freq: Four times a day (QID) | ORAL | Status: DC | PRN

## 2015-02-01 MED ORDER — POLYETHYLENE GLYCOL 3350 17 GM/SCOOP PO POWD: 1.0000 | Freq: Once | ORAL | Status: DC

## 2015-02-01 MED ORDER — FENTANYL CITRATE (PF) 100 MCG/2ML IJ SOLN
50.0000 ug | Freq: Once | INTRAMUSCULAR | Status: AC
Start: 2015-02-01 — End: 2015-02-01
  Administered 2015-02-01: 50 ug via INTRAVENOUS
  Filled 2015-02-01: qty 2

## 2015-02-01 MED ORDER — FLEET ENEMA 7-19 GM/118ML RE ENEM
1.0000 | ENEMA | Freq: Once | RECTAL | Status: AC
  Administered 2015-02-01: 1 via RECTAL
  Filled 2015-02-01: qty 1

## 2015-02-01 NOTE — ED Notes (Signed)
Bed: WA20 Expected date:  Expected time:  Means of arrival:  Comments: 29M abd pain nvd

## 2015-02-01 NOTE — ED Notes (Signed)
Pt with abdominal pain and c/o constipation. Had bowel movement at 2015 tonight, pt denies vomiting or fever. Pt states he had a fall today and injured R shoulder, c/o R shoulder pain.

## 2015-02-01 NOTE — ED Provider Notes (Signed)
CSN: [id number]     Arrival date & time 02/01/15  2122 History   First MD Initiated Contact with Patient 02/01/15 2123     Chief Complaint  Patient presents with  . Abdominal Pain  . Constipation     (Consider location/radiation/quality/duration/timing/severity/associated sxs/prior Treatment) The history is provided by the patient and a friend. No language interpreter was used.   Allen Mayer is a 54 year old male with a history of hypertension, acid reflux, COPD, CAD, asthma, and back pain who presents for gradual onset worsening abdominal pain and constipation since yesterday. He states it began after he ate chitlins, collard greens, and chicken. He states he has felt like going to the bathroom all day but has no had a significant bowel movement.  He tried to eat this morning but stated his abdomen burned. About 2 hours ago he stated that he had a tiny bowel movement. His friend who is at bedside states that his belly appears bigger than normal. He admits to drinking 3 beers and having 3 shots of vodka last night. He states he fell last night but denies any loss of consciousness or head injury. He states he hit his right shoulder and is also complaining of bilateral knee pain. He is currently living in a homeless shelter. He denies any fever, chills, chest pain, shortness of breath, nausea, vomiting, diarrhea, dysuria, hematuria.   Past Medical History  Diagnosis Date  . Irregular heart beat   . Acid reflux   . Hypertension   . COPD (chronic obstructive pulmonary disease) (HCC)   . Arthritis   . Neuropathic pain   . Coronary artery disease   . Asthma   . Bronchitis   . Back pain    History reviewed. No pertinent past surgical history. History reviewed. No pertinent family history. Social History  Substance Use Topics  . Smoking status: Current Every Day Smoker -- 0.50 packs/day  . Smokeless tobacco: None  . Alcohol Use: No    Review of Systems  Constitutional: Negative for  fever.  Gastrointestinal: Positive for abdominal pain, constipation and abdominal distention. Negative for nausea, vomiting, diarrhea and blood in stool.  Musculoskeletal: Positive for myalgias and arthralgias.  All other systems reviewed and are negative.     Allergies  Review of patient's allergies indicates no known allergies.  Home Medications   Prior to Admission medications   Medication Sig Start Date End Date Taking? Authorizing Provider  benztropine (COGENTIN) 1 MG tablet Take 1 mg by mouth at bedtime.   Yes Historical Provider, MD  citalopram (CELEXA) 20 MG tablet Take 20 mg by mouth daily.   Yes Historical Provider, MD  esomeprazole (NEXIUM) 40 MG capsule Take 40 mg by mouth daily at 12 noon.   Yes Historical Provider, MD  gabapentin (NEURONTIN) 800 MG tablet Take 800 mg by mouth at bedtime.   Yes Historical Provider, MD  lisinopril-hydrochlorothiazide (PRINZIDE,ZESTORETIC) 20-25 MG per tablet Take 1 tablet by mouth daily.   Yes Historical Provider, MD  nitroGLYCERIN (NITROSTAT) 0.4 MG SL tablet Place 0.4 mg under the tongue every 5 (five) minutes as needed for chest pain.   Yes Historical Provider, MD  paliperidone (INVEGA) 6 MG 24 hr tablet Take 6 mg by mouth daily.   Yes Historical Provider, MD  traZODone (DESYREL) 100 MG tablet Take 100 mg by mouth 2 (two) times daily.   Yes Historical Provider, MD  meloxicam (MOBIC) 7.5 MG tablet Take 1 tablet (7.5 mg total) by mouth 2 (two) times daily.  For back pain Patient not taking: Reported on 11/07/2014 05/08/12   Linna Hoff, MD  oxyCODONE-acetaminophen (PERCOCET/ROXICET) 5-325 MG per tablet Take 1-2 tablets by mouth every 4 (four) hours as needed for severe pain. Patient not taking: Reported on 02/01/2015 11/07/14   Elpidio Anis, PA-C  polyethylene glycol powder (MIRALAX) powder Take 255 g by mouth once. 02/01/15   Kenika Sahm Patel-Mills, PA-C  traMADol (ULTRAM) 50 MG tablet Take 1 tablet (50 mg total) by mouth every 6 (six) hours as  needed. 02/01/15   Issa Kosmicki Patel-Mills, PA-C   BP 118/78 mmHg  Pulse 66  Temp(Src) 97.9 F (36.6 C) (Oral)  Resp 20  SpO2 100% Physical Exam  Constitutional: He is oriented to person, place, and time. He appears well-developed and well-nourished.  HENT:  Head: Normocephalic and atraumatic.  Eyes: Conjunctivae are normal.  Neck: Normal range of motion. Neck supple.  Cardiovascular: Normal rate, regular rhythm and normal heart sounds.   Pulmonary/Chest: Effort normal and breath sounds normal.  Abdominal: Soft. Normal appearance. He exhibits no distension. There is tenderness in the left upper quadrant and left lower quadrant. There is no rebound and no guarding.  Tenderness to palpation of the left upper and lower quadrant. No guarding or rebound. No abdominal distention.  Musculoskeletal: Normal range of motion.  Able to move all extremities without difficulty. 2+ radial pulses bilaterally. Full ROM.  Bilateral knees are without erythema, edema, or ecchymosis.  Neurological: He is alert and oriented to person, place, and time.  Skin: Skin is warm and dry.  Nursing note and vitals reviewed.   ED Course  Procedures (including critical care time) Labs Review Labs Reviewed  COMPREHENSIVE METABOLIC PANEL - Abnormal; Notable for the following:    Sodium 134 (*)    Glucose, Bld 143 (*)    All other components within normal limits  CBC WITH DIFFERENTIAL/PLATELET  LIPASE, BLOOD    Imaging Review Dg Abd 1 View  02/01/2015  CLINICAL DATA:  Acute onset of left-sided and lower abdominal pain. Constipation. Initial encounter. EXAM: ABDOMEN - 1 VIEW COMPARISON:  CT of the abdomen and pelvis from 01/31/2012 FINDINGS: The visualized bowel gas pattern is unremarkable. Scattered air and stool filled loops of colon are seen; no abnormal dilatation of small bowel loops is seen to suggest small bowel obstruction. No free intra-abdominal air is identified, though evaluation for free air is limited on  a single supine view. Mild degenerative change is noted at the lower lumbar spine; the sacroiliac joints are unremarkable in appearance. IMPRESSION: Unremarkable bowel gas pattern; no free intra-abdominal air seen. Moderate to large amount of stool noted in the colon, raising concern for mild constipation. Electronically Signed   By: Roanna Raider M.D.   On: 02/01/2015 22:01   I have personally reviewed and evaluated these images and lab results as part of my medical decision-making.   EKG Interpretation None      MDM   Final diagnoses:  Constipation, unspecified constipation type  Patient presents for abdominal pain and constipation. He also states that he has bilateral knee pain and right shoulder pain after fall that occurred yesterday but without loss of consciousness or head injury. I believe his knee and shoulder pain are chronic as seen from previous notes. I do not believe he needs imaging at this time. He is ambulatory. I reviewed the [place] knee rules. He states in the past he has had x-rays done which showed arthritis.  Labs are unremarkable. Abdominal x-ray shows moderate to  large amount of stool in the colon which most likely indicates constipation. Normal bowel gas pattern. Patient was given an enema. He had 2 small bowel movements. Patient was prescribed MiraLAX. He states that when he was at [facility] 2 months ago for joint pain he was given Percocet and is requesting that today. I discussed that I would not be giving him up for chronic pain. He states that ibuprofen and tylenol no longer work for his pain. He was given 8 tramadol. I explained that we do not treat chronic pain and that he would need to follow up with a pcp. I gave the patient return precautions and he verbally agrees with the plan.      Catha Gosselin, PA-C 02/01/15 2347  Donnetta Hutching, MD 02/02/15 [phone]

## 2015-02-01 NOTE — Discharge Instructions (Signed)
Constipation, Adult Follow-up with her primary care physician. Take MiraLAX daily. Constipation is when a person has fewer than three bowel movements a week, has difficulty having a bowel movement, or has stools that are dry, hard, or larger than normal. As people grow older, constipation is more common. A low-fiber diet, not taking in enough fluids, and taking certain medicines may make constipation worse.  CAUSES   Certain medicines, such as antidepressants, pain medicine, iron supplements, antacids, and water pills.   Certain diseases, such as diabetes, irritable bowel syndrome (IBS), thyroid disease, or depression.   Not drinking enough water.   Not eating enough fiber-rich foods.   Stress or travel.   Lack of physical activity or exercise.   Ignoring the urge to have a bowel movement.   Using laxatives too much.  SIGNS AND SYMPTOMS   Having fewer than three bowel movements a week.   Straining to have a bowel movement.   Having stools that are hard, dry, or larger than normal.   Feeling full or bloated.   Pain in the lower abdomen.   Not feeling relief after having a bowel movement.  DIAGNOSIS  Your health care provider will take a medical history and perform a physical exam. Further testing may be done for severe constipation. Some tests may include:  A barium enema X-ray to examine your rectum, colon, and, sometimes, your small intestine.   A sigmoidoscopy to examine your lower colon.   A colonoscopy to examine your entire colon. TREATMENT  Treatment will depend on the severity of your constipation and what is causing it. Some dietary treatments include drinking more fluids and eating more fiber-rich foods. Lifestyle treatments may include regular exercise. If these diet and lifestyle recommendations do not help, your health care provider may recommend taking over-the-counter laxative medicines to help you have bowel movements. Prescription medicines  may be prescribed if over-the-counter medicines do not work.  HOME CARE INSTRUCTIONS   Eat foods that have a lot of fiber, such as fruits, vegetables, whole grains, and beans.  Limit foods high in fat and processed sugars, such as french fries, hamburgers, cookies, candies, and soda.   A fiber supplement may be added to your diet if you cannot get enough fiber from foods.   Drink enough fluids to keep your urine clear or pale yellow.   Exercise regularly or as directed by your health care provider.   Go to the restroom when you have the urge to go. Do not hold it.   Only take over-the-counter or prescription medicines as directed by your health care provider. Do not take other medicines for constipation without talking to your health care provider first.  SEEK IMMEDIATE MEDICAL CARE IF:   You have bright red blood in your stool.   Your constipation lasts for more than 4 days or gets worse.   You have abdominal or rectal pain.   You have thin, pencil-like stools.   You have unexplained weight loss. MAKE SURE YOU:   Understand these instructions.  Will watch your condition.  Will get help right away if you are not doing well or get worse.   This information is not intended to replace advice given to you by your health care provider. Make sure you discuss any questions you have with your health care provider.   Document Released: 12/25/2003 Document Revised: 04/18/2014 Document Reviewed: 01/07/2013 Elsevier Interactive Patient Education [employer]

## 2015-04-07 DIAGNOSIS — Z139 Encounter for screening, unspecified: Secondary | ICD-10-CM

## 2015-04-16 NOTE — Congregational Nurse Program (Signed)
Congregational Nurse Program Note  Date of Encounter: 04/07/2015  Past Medical History: No past medical history on file.  Encounter Details:     CNP Questionnaire - 04/07/15 1539    Patient Demographics   Is this a new or existing patient? New   Patient is considered a/an Not Applicable   Race American Indian/[place] Native   Patient Assistance   Location of Patient Assistance Not Applicable   Patient's financial/insurance status Orange Card/[employer] Connects   Uninsured Patient Yes   Interventions Counseled to make appt. with provider   Patient referred to apply for the following financial assistance Not Applicable   Food insecurities addressed Provided food supplies   Transportation assistance No   Assistance securing medications No   Educational health offerings Chronic disease   Encounter Details   Primary purpose of visit Chronic Illness/Condition Visit   Patient referred to Clinic   Was a mental health screening completed? (GAINS tool) No   Does patient have dental issues? No   Since previous encounter, have you referred patient for abnormal blood pressure that resulted in a new diagnosis or medication change? No   Since previous encounter, have you referred patient for abnormal blood glucose that resulted in a new diagnosis or medication change? No   For Abstraction Use Only   Does patient have insurance? No       Clinic visit for B/P check.  States is in chronic pain and wants a note for bedrest.  Request was denied.  Requesting pain medications.  States sees Chales Abrahams Placey at the [facility].  Encouraged him to make an appointment with her for pain management follow up.

## 2015-05-01 DIAGNOSIS — Z139 Encounter for screening, unspecified: Secondary | ICD-10-CM

## 2015-05-08 NOTE — Congregational Nurse Program (Signed)
Congregational Nurse Program Note  Date of Encounter: 05/01/2015  Past Medical History: No past medical history on file.  Encounter Details:     CNP Questionnaire - 04/30/15 1306    Patient Demographics   Is this a new or existing patient? New   Patient is considered a/an Not Applicable   Race American Indian/[place] Native   Patient Assistance   Location of Patient Assistance Not Applicable   Patient's financial/insurance status Orange Card/Care Connects   Uninsured Patient Yes   Interventions Counseled to make appt. with provider   Patient referred to apply for the following financial assistance Not Applicable   Food insecurities addressed Provided food supplies   Transportation assistance No   Assistance securing medications No   Educational health offerings Chronic disease;Hypertension   Encounter Details   Primary purpose of visit Chronic Illness/Condition Visit;Education/Health Concerns   Was an Emergency Department visit averted? Not Applicable   Does patient have a medical provider? Yes   Patient referred to Clinic   Was a mental health screening completed? (GAINS tool) No   Does patient have dental issues? No   Since previous encounter, have you referred patient for abnormal blood pressure that resulted in a new diagnosis or medication change? No   Since previous encounter, have you referred patient for abnormal blood glucose that resulted in a new diagnosis or medication change? No   For Abstraction Use Only   Does patient have insurance? No       Clinic visit for B/P check.  Is seeing Lavinia Sharps NP at the [facility].  Instructed client to return to clinic next week to re-check B/P.  Client is a smoker.  Discussed with him the need to at least begin cutting down.  Discussed impact of smoking on his B/P

## 2015-05-26 DIAGNOSIS — Z139 Encounter for screening, unspecified: Secondary | ICD-10-CM

## 2015-06-03 NOTE — Congregational Nurse Program (Signed)
Congregational Nurse Program Note  Date of Encounter: 05/26/2015  Past Medical History: Past Medical History  Diagnosis Date  . Irregular heart beat   . Acid reflux   . Hypertension   . COPD (chronic obstructive pulmonary disease) (HCC)   . Arthritis   . Neuropathic pain   . Coronary artery disease   . Asthma   . Bronchitis   . Back pain     Encounter Details:     CNP Questionnaire - 05/26/15 2020    Patient Demographics   Is this a new or existing patient? Existing   Patient is considered a/an Not Applicable   Race American Indian/[place] Native   Patient Assistance   Location of Patient Assistance Not Applicable   Patient's financial/insurance status [employer] Card/Care Connects   Uninsured Patient Yes   Interventions Counseled to make appt. with provider   Patient referred to apply for the following financial assistance Not Applicable   Food insecurities addressed Provided food supplies   Transportation assistance No   Assistance securing medications No   Educational health offerings Chronic disease;Hypertension   Encounter Details   Primary purpose of visit Chronic Illness/Condition Visit;Education/Health Concerns   Was an Emergency Department visit averted? Not Applicable   Does patient have a medical provider? Yes   Patient referred to Clinic   Was a mental health screening completed? (GAINS tool) No   Does patient have dental issues? No   Does patient have vision issues? No   Since previous encounter, have you referred patient for abnormal blood pressure that resulted in a new diagnosis or medication change? No   Since previous encounter, have you referred patient for abnormal blood glucose that resulted in a new diagnosis or medication change? No   For Abstraction Use Only   Does patient have insurance? No     B/P check

## 2015-06-16 ENCOUNTER — Encounter (HOSPITAL_COMMUNITY): Payer: Self-pay | Admitting: Emergency Medicine

## 2015-06-16 ENCOUNTER — Emergency Department (HOSPITAL_COMMUNITY): Payer: Medicaid Other

## 2015-06-16 ENCOUNTER — Emergency Department (HOSPITAL_COMMUNITY)
Admission: EM | Admit: 2015-06-16 | Discharge: 2015-06-16 | Disposition: A | Discharge status: Home / Self Care | Payer: Medicaid Other | Attending: Emergency Medicine | Admitting: Emergency Medicine

## 2015-06-16 DIAGNOSIS — M792 Neuralgia and neuritis, unspecified: Secondary | ICD-10-CM | POA: Insufficient documentation

## 2015-06-16 DIAGNOSIS — I2510 Atherosclerotic heart disease of native coronary artery without angina pectoris: Secondary | ICD-10-CM | POA: Insufficient documentation

## 2015-06-16 DIAGNOSIS — F17200 Nicotine dependence, unspecified, uncomplicated: Secondary | ICD-10-CM | POA: Insufficient documentation

## 2015-06-16 DIAGNOSIS — R0602 Shortness of breath: Secondary | ICD-10-CM | POA: Diagnosis present

## 2015-06-16 DIAGNOSIS — K219 Gastro-esophageal reflux disease without esophagitis: Secondary | ICD-10-CM | POA: Insufficient documentation

## 2015-06-16 DIAGNOSIS — Z79899 Other long term (current) drug therapy: Secondary | ICD-10-CM | POA: Insufficient documentation

## 2015-06-16 DIAGNOSIS — J441 Chronic obstructive pulmonary disease with (acute) exacerbation: Secondary | ICD-10-CM | POA: Insufficient documentation

## 2015-06-16 DIAGNOSIS — R0603 Acute respiratory distress: Secondary | ICD-10-CM

## 2015-06-16 DIAGNOSIS — I10 Essential (primary) hypertension: Secondary | ICD-10-CM | POA: Diagnosis not present

## 2015-06-16 LAB — BASIC METABOLIC PANEL
Anion gap: 15 (ref 5–15)
BUN: 9 mg/dL (ref 6–20)
CALCIUM: 9.7 mg/dL (ref 8.9–10.3)
CO2: 24 mmol/L (ref 22–32)
CREATININE: 1.11 mg/dL (ref 0.61–1.24)
Chloride: 103 mmol/L (ref 101–111)
Glucose, Bld: 82 mg/dL (ref 65–99)
Potassium: 3.8 mmol/L (ref 3.5–5.1)
SODIUM: 142 mmol/L (ref 135–145)

## 2015-06-16 LAB — I-STAT TROPONIN, ED: TROPONIN I, POC: 0.01 ng/mL (ref 0.00–0.08)

## 2015-06-16 LAB — CBC
HCT: 43.5 % (ref 39.0–52.0)
Hemoglobin: 14.6 g/dL (ref 13.0–17.0)
MCH: 30 pg (ref 26.0–34.0)
MCHC: 33.6 g/dL (ref 30.0–36.0)
MCV: 89.3 fL (ref 78.0–100.0)
PLATELETS: 234 10*3/uL (ref 150–400)
RBC: 4.87 MIL/uL (ref 4.22–5.81)
RDW: 14.1 % (ref 11.5–15.5)
WBC: 6.2 10*3/uL (ref 4.0–10.5)

## 2015-06-16 MED ORDER — PREDNISONE 20 MG PO TABS: 60.0000 mg | ORAL_TABLET | Freq: Every day | ORAL | Status: DC

## 2015-06-16 MED ORDER — IPRATROPIUM BROMIDE 0.02 % IN SOLN
1.0000 mg | Freq: Once | RESPIRATORY_TRACT | Status: AC
  Administered 2015-06-16: 1 mg via RESPIRATORY_TRACT
  Filled 2015-06-16: qty 5

## 2015-06-16 MED ORDER — PREDNISONE 20 MG PO TABS
60.0000 mg | ORAL_TABLET | Freq: Once | ORAL | Status: AC
  Administered 2015-06-16: 60 mg via ORAL
  Filled 2015-06-16: qty 3

## 2015-06-16 MED ORDER — ALBUTEROL SULFATE (2.5 MG/3ML) 0.083% IN NEBU
5.0000 mg | INHALATION_SOLUTION | Freq: Once | RESPIRATORY_TRACT | Status: AC
  Administered 2015-06-16: 5 mg via RESPIRATORY_TRACT

## 2015-06-16 MED ORDER — MAGNESIUM SULFATE 2 GM/50ML IV SOLN
2.0000 g | Freq: Once | INTRAVENOUS | Status: AC
Start: 2015-06-16 — End: 2015-06-16
  Administered 2015-06-16: 2 g via INTRAVENOUS
  Filled 2015-06-16: qty 50

## 2015-06-16 MED ORDER — ALBUTEROL SULFATE (2.5 MG/3ML) 0.083% IN NEBU
INHALATION_SOLUTION | RESPIRATORY_TRACT | Status: AC
  Filled 2015-06-16: qty 6

## 2015-06-16 MED ORDER — ALBUTEROL (5 MG/ML) CONTINUOUS INHALATION SOLN
10.0000 mg/h | INHALATION_SOLUTION | RESPIRATORY_TRACT | Status: DC
  Administered 2015-06-16: 10 mg/h via RESPIRATORY_TRACT
  Filled 2015-06-16: qty 20

## 2015-06-16 NOTE — ED Notes (Signed)
Pt. reports chronic SOB with productive cough ( COPD) for several years worse this week unrelieved by MDI , pt. added body aches and fatigue . Denies fever or chills.

## 2015-06-16 NOTE — ED Provider Notes (Signed)
CSN: [id number]     Arrival date & time 06/16/15  0007 History   First MD Initiated Contact with Patient 06/16/15 [phone]     Chief Complaint  Patient presents with  . Shortness of Breath     (Consider location/radiation/quality/duration/timing/severity/associated sxs/prior Treatment) HPI  Allen Mayer is a 55 y.o. male with PMH of COPD, here with worsening SOB.  He states this has been going on for several days and is consistent with his COPD.  He denies any worsening cough or fevers.  He has no sick contacts.  He has been using inhalers and nebs at home without significant relief.  He denies any chest pain, N/V/D.  He has no further complaints.  10 Systems reviewed and are negative for acute change except as noted in the HPI.    Past Medical History  Diagnosis Date  . Irregular heart beat   . Acid reflux   . Hypertension   . COPD (chronic obstructive pulmonary disease) (HCC)   . Arthritis   . Neuropathic pain   . Coronary artery disease   . Asthma   . Bronchitis   . Back pain    History reviewed. No pertinent past surgical history. No family history on file. Social History  Substance Use Topics  . Smoking status: Current Every Day Smoker -- 0.50 packs/day  . Smokeless tobacco: None  . Alcohol Use: No    Review of Systems    Allergies  Review of patient's allergies indicates no known allergies.  Home Medications   Prior to Admission medications   Medication Sig Start Date End Date Taking? Authorizing Provider  benztropine (COGENTIN) 1 MG tablet Take 1 mg by mouth at bedtime.   Yes Historical Provider, MD  citalopram (CELEXA) 20 MG tablet Take 20 mg by mouth daily.   Yes Historical Provider, MD  esomeprazole (NEXIUM) 40 MG capsule Take 40 mg by mouth daily at 12 noon.   Yes Historical Provider, MD  gabapentin (NEURONTIN) 800 MG tablet Take 800 mg by mouth at bedtime.   Yes Historical Provider, MD  lisinopril-hydrochlorothiazide (PRINZIDE,ZESTORETIC) 20-25 MG per  tablet Take 1 tablet by mouth daily.   Yes Historical Provider, MD  nitroGLYCERIN (NITROSTAT) 0.4 MG SL tablet Place 0.4 mg under the tongue every 5 (five) minutes as needed for chest pain.   Yes Historical Provider, MD  paliperidone (INVEGA) 6 MG 24 hr tablet Take 6 mg by mouth daily.   Yes Historical Provider, MD  traZODone (DESYREL) 100 MG tablet Take 100 mg by mouth at bedtime.    Yes Historical Provider, MD   BP 154/74 mmHg  Pulse 60  Temp(Src) 98 F (36.7 C) (Oral)  Resp 19  SpO2 100% Physical Exam  Constitutional: He is oriented to person, place, and time. Vital signs are normal. He appears well-developed and well-nourished.  Non-toxic appearance. He does not appear ill. No distress.  HENT:  Head: Normocephalic and atraumatic.  Nose: Nose normal.  Mouth/Throat: Oropharynx is clear and moist. No oropharyngeal exudate.  Eyes: Conjunctivae and EOM are normal. Pupils are equal, round, and reactive to light. No scleral icterus.  Neck: Normal range of motion. Neck supple. No tracheal deviation, no edema, no erythema and normal range of motion present. No thyroid mass and no thyromegaly present.  Cardiovascular: Normal rate, regular rhythm, S1 normal, S2 normal, normal heart sounds, intact distal pulses and normal pulses.  Exam reveals no gallop and no friction rub.   No murmur heard. Pulmonary/Chest: Effort normal. No respiratory  distress. He has wheezes. He has no rhonchi. He has no rales.  Abdominal: Soft. Normal appearance and bowel sounds are normal. He exhibits no distension, no ascites and no mass. There is no hepatosplenomegaly. There is no tenderness. There is no rebound, no guarding and no CVA tenderness.  Musculoskeletal: Normal range of motion. He exhibits no edema or tenderness.  Lymphadenopathy:    He has no cervical adenopathy.  Neurological: He is alert and oriented to person, place, and time. He has normal strength. No cranial nerve deficit or sensory deficit.  Skin: Skin  is warm, dry and intact. No petechiae and no rash noted. He is not diaphoretic. No erythema. No pallor.  Psychiatric: He has a normal mood and affect. His behavior is normal. Judgment normal.  Nursing note and vitals reviewed.   ED Course  Procedures (including critical care time) Labs Review Labs Reviewed  BASIC METABOLIC PANEL  CBC  I-STAT TROPOININ, ED    Imaging Review Dg Chest 2 View  06/16/2015  CLINICAL DATA:  Shortness of breath and body aches. Cough for 1 day. History of COPD. EXAM: CHEST  2 VIEW COMPARISON:  11/06/2014 FINDINGS: The heart size and mediastinal contours are within normal limits. Both lungs are clear. The visualized skeletal structures are unremarkable. IMPRESSION: No active cardiopulmonary disease. Electronically Signed   By: Burman Nieves M.D.   On: 06/16/2015 01:03   I have personally reviewed and evaluated these images and lab results as part of my medical decision-making.   EKG Interpretation   Date/Time:  Tuesday June 16 2015 00:17:43 EST Ventricular Rate:  72 PR Interval:  164 QRS Duration: 92 QT Interval:  434 QTC Calculation: 475 R Axis:   46 Text Interpretation:  Normal sinus rhythm with sinus arrhythmia Minimal  voltage criteria for LVH, may be normal variant Septal infarct , age  undetermined Abnormal ECG TWI improved Confirmed by Erroll Luna  [phone]) on 06/16/2015 4:55:06 AM      MDM   Final diagnoses:  SOB (shortness of breath)    Patient presents to the ED for COPD exacerbation.  He was given albuterol, ipratropium, magnesium and prednisone.  Upon repeat evaluation, he wheezing has resolved and patient feels better as well.  PCP fu advised.  DC home with 4 more days of steroids.  He appears well and in NAD.  VS remain within his normal limits and he is safe for DC.    CRITICAL CARE Performed by: Tomasita Crumble   Total critical care time: 35 minutes - respiratory distress  Critical care time was exclusive of separately  billable procedures and treating other patients.  Critical care was necessary to treat or prevent imminent or life-threatening deterioration.  Critical care was time spent personally by me on the following activities: development of treatment plan with patient and/or surrogate as well as nursing, discussions with consultants, evaluation of patient's response to treatment, examination of patient, obtaining history from patient or surrogate, ordering and performing treatments and interventions, ordering and review of laboratory studies, ordering and review of radiographic studies, pulse oximetry and re-evaluation of patient's condition.   Tomasita Crumble, MD 06/16/15 [phone]

## 2015-06-16 NOTE — Discharge Instructions (Signed)
Chronic Obstructive Pulmonary Disease Exacerbation Allen Mayer, take prednisone daily as prescribe to complete your treatment.  Also use albuterol inhaler every 6 hours for the next 2 days, then only as needed after that.  See a primary care doctor within 3 days for close follow up. If symptoms worsen, come back to the ED Immediately. Thank you. Chronic obstructive pulmonary disease (COPD) is a common lung problem. In COPD, the flow of air from the lungs is limited. COPD exacerbations are times that breathing gets worse and you need extra treatment. Without treatment they can be life threatening. If they happen often, your lungs can become more damaged. If your COPD gets worse, your doctor may treat you with:  Medicines.  Oxygen.  Different ways to clear your airway, such as using a mask. HOME CARE  Do not smoke.  Avoid tobacco smoke and other things that bother your lungs.  If given, take your antibiotic medicine as told. Finish the medicine even if you start to feel better.  Only take medicines as told by your doctor.  Drink enough fluids to keep your pee (urine) clear or pale yellow (unless your doctor has told you not to).  Use a cool mist machine (vaporizer).  If you use oxygen or a machine that turns liquid medicine into a mist (nebulizer), continue to use them as told.  Keep up with shots (vaccinations) as told by your doctor.  Exercise regularly.  Eat healthy foods.  Keep all doctor visits as told. GET HELP RIGHT AWAY IF:  You are very short of breath and it gets worse.  You have trouble talking.  You have bad chest pain.  You have blood in your spit (sputum).  You have a fever.  You keep throwing up (vomiting).  You feel weak, or you pass out (faint).  You feel confused.  You keep getting worse. MAKE SURE YOU:  Understand these instructions.  Will watch your condition.  Will get help right away if you are not doing well or get worse.   This  information is not intended to replace advice given to you by your health care provider. Make sure you discuss any questions you have with your health care provider.   Document Released: 03/17/2011 Document Revised: 04/18/2014 Document Reviewed: 11/30/2012 Elsevier Interactive Patient Education [employer]

## 2015-07-11 ENCOUNTER — Emergency Department (HOSPITAL_COMMUNITY)
Admission: EM | Admit: 2015-07-11 | Discharge: 2015-07-12 | Disposition: A | Discharge status: Home / Self Care | Payer: Medicaid Other | Attending: Emergency Medicine | Admitting: Emergency Medicine

## 2015-07-11 ENCOUNTER — Encounter (HOSPITAL_COMMUNITY): Payer: Self-pay | Admitting: *Deleted

## 2015-07-11 DIAGNOSIS — I10 Essential (primary) hypertension: Secondary | ICD-10-CM | POA: Diagnosis not present

## 2015-07-11 DIAGNOSIS — G6289 Other specified polyneuropathies: Secondary | ICD-10-CM | POA: Insufficient documentation

## 2015-07-11 DIAGNOSIS — M545 Low back pain, unspecified: Secondary | ICD-10-CM

## 2015-07-11 DIAGNOSIS — G8929 Other chronic pain: Secondary | ICD-10-CM | POA: Insufficient documentation

## 2015-07-11 DIAGNOSIS — Z7951 Long term (current) use of inhaled steroids: Secondary | ICD-10-CM | POA: Insufficient documentation

## 2015-07-11 DIAGNOSIS — J449 Chronic obstructive pulmonary disease, unspecified: Secondary | ICD-10-CM | POA: Diagnosis not present

## 2015-07-11 DIAGNOSIS — Z79899 Other long term (current) drug therapy: Secondary | ICD-10-CM | POA: Diagnosis not present

## 2015-07-11 DIAGNOSIS — K219 Gastro-esophageal reflux disease without esophagitis: Secondary | ICD-10-CM | POA: Diagnosis not present

## 2015-07-11 DIAGNOSIS — I2510 Atherosclerotic heart disease of native coronary artery without angina pectoris: Secondary | ICD-10-CM | POA: Diagnosis not present

## 2015-07-11 DIAGNOSIS — M549 Dorsalgia, unspecified: Secondary | ICD-10-CM | POA: Diagnosis present

## 2015-07-11 DIAGNOSIS — F17200 Nicotine dependence, unspecified, uncomplicated: Secondary | ICD-10-CM | POA: Insufficient documentation

## 2015-07-11 MED ORDER — OXYCODONE-ACETAMINOPHEN 5-325 MG PO TABS
1.0000 | ORAL_TABLET | Freq: Once | ORAL | Status: AC
  Administered 2015-07-12: 1 via ORAL
  Filled 2015-07-11: qty 1

## 2015-07-11 MED ORDER — GABAPENTIN 400 MG PO CAPS
800.0000 mg | ORAL_CAPSULE | Freq: Every day | ORAL | Status: DC
  Administered 2015-07-12: 800 mg via ORAL
  Filled 2015-07-11: qty 2

## 2015-07-11 MED ORDER — GABAPENTIN 800 MG PO TABS: 800.0000 mg | ORAL_TABLET | Freq: Every day | ORAL | Status: DC

## 2015-07-11 MED ORDER — FENTANYL 12 MCG/HR TD PT72
12.5000 ug | MEDICATED_PATCH | Freq: Once | TRANSDERMAL | Status: DC
  Administered 2015-07-12: 12.5 ug via TRANSDERMAL

## 2015-07-11 NOTE — ED Notes (Signed)
Pt falling asleep while attempting to finish triage

## 2015-07-11 NOTE — Discharge Instructions (Signed)

## 2015-07-11 NOTE — ED Provider Notes (Signed)
CSN: [id number]     Arrival date & time 07/11/15  2126 History  By signing my name below, I, Octavia Heir, attest that this documentation has been prepared under the direction and in the presence of [employer], PA-C. Electronically Signed: Octavia Heir, ED Scribe. 07/11/2015. 11:23 PM.    Chief Complaint  Patient presents with  . Back Pain    The history is provided by the patient. No language interpreter was used.   HPI Comments: Allen Mayer is a 55 y.o. male who has a PMHx of HTN, COPD, neuropathic pain, CAD, and chronic back pain presents to the Emergency Department complaining of constant, gradual worsening, chronic lower back pain onset years ago. He notes having increased back pain tonight with associated tingling in his feet and aching pain in his bilateral legs. He says ambulating increases his pain. Pt is on oxycodone and gabapentin for his pain medication. He notes the oxycodone has not been helping him alleviate his pain and he has been out of gabapentin for the past month. He says he believes since he has been out of gabapentin, his pain has been worse. Denies bowel or bladder incontinence, falls or trauma to the area. Pt has an appointment with his pain management doctor on Thursday.  Past Medical History  Diagnosis Date  . Irregular heart beat   . Acid reflux   . Hypertension   . COPD (chronic obstructive pulmonary disease) (HCC)   . Arthritis   . Neuropathic pain   . Coronary artery disease   . Asthma   . Bronchitis   . Back pain    History reviewed. No pertinent past surgical history. No family history on file. Social History  Substance Use Topics  . Smoking status: Current Every Day Smoker -- 0.50 packs/day  . Smokeless tobacco: None  . Alcohol Use: No    Review of Systems  Musculoskeletal: Positive for back pain and arthralgias.  All other systems reviewed and are negative.   Allergies  Review of patient's allergies indicates no known  allergies.  Home Medications   Prior to Admission medications   Medication Sig Start Date End Date Taking? Authorizing Provider  albuterol (PROVENTIL HFA;VENTOLIN HFA) 108 (90 Base) MCG/ACT inhaler Inhale 1-2 puffs into the lungs every 6 (six) hours as needed for wheezing or shortness of breath.   Yes Historical Provider, MD  beclomethasone (QVAR) 80 MCG/ACT inhaler Inhale 1 puff into the lungs 2 (two) times daily.   Yes Historical Provider, MD  benztropine (COGENTIN) 1 MG tablet Take 1 mg by mouth at bedtime.   Yes Historical Provider, MD  citalopram (CELEXA) 20 MG tablet Take 20 mg by mouth daily.   Yes Historical Provider, MD  esomeprazole (NEXIUM) 40 MG capsule Take 40 mg by mouth daily at 12 noon.   Yes Historical Provider, MD  lisinopril-hydrochlorothiazide (PRINZIDE,ZESTORETIC) 20-25 MG per tablet Take 1 tablet by mouth daily.   Yes Historical Provider, MD  paliperidone (INVEGA) 6 MG 24 hr tablet Take 6 mg by mouth daily.   Yes Historical Provider, MD  traZODone (DESYREL) 100 MG tablet Take 100 mg by mouth at bedtime.    Yes Historical Provider, MD  gabapentin (NEURONTIN) 800 MG tablet Take 1 tablet (800 mg total) by mouth at bedtime. 07/11/15   Antony Madura, PA-C  nitroGLYCERIN (NITROSTAT) 0.4 MG SL tablet Place 0.4 mg under the tongue every 5 (five) minutes as needed for chest pain.    Historical Provider, MD  predniSONE (DELTASONE) 20 MG  tablet Take 3 tablets (60 mg total) by mouth daily. Patient not taking: Reported on 07/11/2015 06/16/15   Tomasita Crumble, MD   Triage vitals: BP 175/90 mmHg  Pulse 79  Temp(Src) 98 F (36.7 C) (Oral)  Resp 18  SpO2 94%  Physical Exam  Constitutional: He is oriented to person, place, and time. He appears well-developed and well-nourished. No distress.  Nontoxic/nonseptic appearing  HENT:  Head: Normocephalic and atraumatic.  Eyes: Conjunctivae and EOM are normal. No scleral icterus.  Neck: Normal range of motion.  Cardiovascular: Normal rate, regular  rhythm and intact distal pulses.   DP and PT pulses 2+ in BLE  Pulmonary/Chest: Effort normal. No respiratory distress.  Respirations even and unlabored  Musculoskeletal: Normal range of motion. He exhibits tenderness.  TTP to lumbar midline and paraspinal muscles at level of L3/4. No bony deformities, step offs, or crepitus.  Neurological: He is alert and oriented to person, place, and time. He exhibits normal muscle tone. Coordination normal.  Sensation to light touch intact in all digits. Patient able to wiggle all toes. Patellar and achilles reflexes intact b/l  Skin: Skin is warm and dry. No rash noted. He is not diaphoretic. No erythema. No pallor.  Psychiatric: He has a normal mood and affect. His behavior is normal.  Nursing note and vitals reviewed.   ED Course  Procedures  DIAGNOSTIC STUDIES: Oxygen Saturation is 94% on RA, low by my interpretation.  COORDINATION OF CARE:  11:27 PM Discussed treatment plan with pt at bedside and pt agreed to plan.  Labs Review Labs Reviewed - No data to display  Imaging Review No results found.   I have personally reviewed and evaluated these images and lab results as part of my medical decision-making.   EKG Interpretation None      MDM   Final diagnoses:  Chronic low back pain  Other polyneuropathy (HCC)    55 year old male with a history of chronic low back pain and peripheral neuropathy, followed by pain management, presents for worsening acute on chronic low back pain. He reports being out of his gabapentin for 1 month as his insurance will not cover this medication. He has also been out of oxycodone for the past day. He is scheduled for a refill by his pain management doctor in 5 days. Patient is neurovascularly intact. No history of trauma or injury. No red flags or signs concerning for cauda equina. No indication for further emergent workup or imaging.  Patient given fentanyl patch in the emergency department. Will attempt  to refill gabapentin. Patient advised to follow-up with his primary care and pain management physicians. Return precautions given at discharge. Patient discharged as in satisfactory condition with no unaddressed concerns.  I personally performed the services described in this documentation, which was scribed in my presence. The recorded information has been reviewed and is accurate.    Filed Vitals:   07/11/15 2131 07/11/15 2246 07/12/15 0009 07/12/15 0010  BP:  175/90 189/95   Pulse:  79  73  Temp:  98 F (36.7 C)    TempSrc:  Oral    Resp:  18    SpO2: 95% 94%  95%     Antony Madura, PA-C 07/12/15 0038  Doug Sou, MD 07/12/15 0045

## 2015-07-11 NOTE — ED Notes (Signed)
Pt states that he has chronic back pain; pt states that his pain got worse tonight; pt denies injury to back to increase pain; pt c/o tingling to his feet; pt ambulatory from triage to room; pt eating and watching TV during triage

## 2015-07-11 NOTE — ED Notes (Signed)
Pt c/o back pain with bilat lower back pain, hx of DDD and gout.

## 2015-07-12 MED ORDER — LISINOPRIL-HYDROCHLOROTHIAZIDE 20-25 MG PO TABS: 1.0000 | ORAL_TABLET | Freq: Every day | ORAL | Status: DC

## 2015-07-12 MED ORDER — BECLOMETHASONE DIPROPIONATE 80 MCG/ACT IN AERS: 1.0000 | INHALATION_SPRAY | Freq: Two times a day (BID) | RESPIRATORY_TRACT | Status: DC | PRN

## 2015-07-12 MED ORDER — ALBUTEROL SULFATE HFA 108 (90 BASE) MCG/ACT IN AERS: 2.0000 | INHALATION_SPRAY | RESPIRATORY_TRACT | Status: DC | PRN

## 2015-07-12 NOTE — ED Notes (Signed)
Pt had concerns about prescriptions, chart opened to review

## 2015-08-14 ENCOUNTER — Encounter (HOSPITAL_COMMUNITY): Payer: Self-pay | Admitting: Adult Health

## 2015-08-14 ENCOUNTER — Emergency Department (HOSPITAL_COMMUNITY)
Admission: EM | Admit: 2015-08-14 | Discharge: 2015-08-14 | Disposition: A | Discharge status: Home / Self Care | Payer: Medicaid Other | Attending: Emergency Medicine | Admitting: Emergency Medicine

## 2015-08-14 DIAGNOSIS — M1990 Unspecified osteoarthritis, unspecified site: Secondary | ICD-10-CM | POA: Diagnosis not present

## 2015-08-14 DIAGNOSIS — R45851 Suicidal ideations: Secondary | ICD-10-CM | POA: Insufficient documentation

## 2015-08-14 DIAGNOSIS — F17200 Nicotine dependence, unspecified, uncomplicated: Secondary | ICD-10-CM | POA: Diagnosis not present

## 2015-08-14 DIAGNOSIS — F329 Major depressive disorder, single episode, unspecified: Secondary | ICD-10-CM | POA: Diagnosis not present

## 2015-08-14 DIAGNOSIS — I10 Essential (primary) hypertension: Secondary | ICD-10-CM | POA: Insufficient documentation

## 2015-08-14 DIAGNOSIS — I2510 Atherosclerotic heart disease of native coronary artery without angina pectoris: Secondary | ICD-10-CM | POA: Insufficient documentation

## 2015-08-14 DIAGNOSIS — K219 Gastro-esophageal reflux disease without esophagitis: Secondary | ICD-10-CM | POA: Insufficient documentation

## 2015-08-14 DIAGNOSIS — Z79899 Other long term (current) drug therapy: Secondary | ICD-10-CM | POA: Insufficient documentation

## 2015-08-14 DIAGNOSIS — J449 Chronic obstructive pulmonary disease, unspecified: Secondary | ICD-10-CM | POA: Diagnosis not present

## 2015-08-14 DIAGNOSIS — F1410 Cocaine abuse, uncomplicated: Secondary | ICD-10-CM | POA: Insufficient documentation

## 2015-08-14 DIAGNOSIS — F32A Depression, unspecified: Secondary | ICD-10-CM

## 2015-08-14 DIAGNOSIS — Z008 Encounter for other general examination: Secondary | ICD-10-CM | POA: Diagnosis present

## 2015-08-14 LAB — RAPID URINE DRUG SCREEN, HOSP PERFORMED
Amphetamines: NOT DETECTED
BENZODIAZEPINES: NOT DETECTED
Barbiturates: NOT DETECTED
Cocaine: POSITIVE — AB
OPIATES: NOT DETECTED
Tetrahydrocannabinol: NOT DETECTED

## 2015-08-14 LAB — COMPREHENSIVE METABOLIC PANEL
ALBUMIN: 4.1 g/dL (ref 3.5–5.0)
ALT: 58 U/L (ref 17–63)
AST: 48 U/L — AB (ref 15–41)
Alkaline Phosphatase: 53 U/L (ref 38–126)
Anion gap: 13 (ref 5–15)
BUN: 12 mg/dL (ref 6–20)
CHLORIDE: 100 mmol/L — AB (ref 101–111)
CO2: 27 mmol/L (ref 22–32)
CREATININE: 1.1 mg/dL (ref 0.61–1.24)
Calcium: 9.7 mg/dL (ref 8.9–10.3)
GFR calc Af Amer: >60 mL/min (ref >60)
GLUCOSE: 86 mg/dL (ref 65–99)
Potassium: 4.1 mmol/L (ref 3.5–5.1)
Sodium: 140 mmol/L (ref 135–145)
Total Bilirubin: 0.6 mg/dL (ref 0.3–1.2)
Total Protein: 7.2 g/dL (ref 6.5–8.1)

## 2015-08-14 LAB — CBC
HCT: 44.5 % (ref 39.0–52.0)
Hemoglobin: 15 g/dL (ref 13.0–17.0)
MCH: 30.4 pg (ref 26.0–34.0)
MCHC: 33.7 g/dL (ref 30.0–36.0)
MCV: 90.1 fL (ref 78.0–100.0)
PLATELETS: 226 10*3/uL (ref 150–400)
RBC: 4.94 MIL/uL (ref 4.22–5.81)
RDW: 14 % (ref 11.5–15.5)
WBC: 8.7 10*3/uL (ref 4.0–10.5)

## 2015-08-14 LAB — ETHANOL: ALCOHOL ETHYL (B): 38 mg/dL — AB (ref <5)

## 2015-08-14 LAB — SALICYLATE LEVEL

## 2015-08-14 LAB — ACETAMINOPHEN LEVEL

## 2015-08-14 NOTE — Discharge Instructions (Signed)
Suicide Resources  Who to Call  Call 911  National Suicide Prevention Hotline 1-800-SUICIDE or (800) (201)075-8631)  [facility] at (709)474-7464; 530-168-3782  More Resources  Suicide Awareness Voices of Education       613-638-5495        www.save.org  [employer] on Mental Illness(NAMI)       (800) 950-NAMI        www.nami.org  American Association of Suicidology       5807732082        Www.suicidology.org  [employer] Guide Outpatient Counseling/Substance Abuse Adult The United Ways 211 is a great source of information about community services available.  Access by dialing 2-1-1 from anywhere in [place], or by website -  PooledIncome.pl.   Other Local Resources (Updated 04/2015)  Crisis Hotlines   Services     Area Served  [employer]  Crisis Hotline, available 24 hours a day, 7 days a week: (225)679-6312 [facility], [place]   [facility]  Crisis Hotline, available 24 hours a day, 7 days a week: 908-776-5616 [facility], [place]  [facility]  Suicide Prevention Hotline, available 24 hours a day, 7 days a week: 504-531-1350 [facility], [place]  [employer], available 24 hours a day, 7 days a week: (831) 305-6812 [facility], [place]   [facility] Access to [employer], available 24 hours a day, 7 days a week: 540-525-6110 All   [facility]  Crisis Hotline, available 24 hours a day, 7 days a week: (506)517-7077 All   Other Local Resources (Updated 04/2015)  Outpatient Counseling/ Substance Abuse Programs  Services     Address and Phone Number  ADS (Alcohol and Drug Services)   Options include Individual counseling, group counseling, intensive outpatient program (several hours a day, several days a week)  Offers depression assessments  Provides methadone maintenance program [phone] [address],  [address] [place], [place] [address]   [facility]   Offers partial hospitalization/day treatment and DUI/DWI programs  [employer], private insurance [phone] [address] [place], [place] [address]  [facility]    Services include intensive outpatient program (several hours a day, several days a week), outpatient treatment, DUI/DWI services, family education  Also has some services specifically for [employer] transitional housing  [phone] [address] [place], [place] [address]     [facility]  [employer], private pay, and private insurance [phone] [address] [place], [place] [address]  [employer] of Care  Services include individual counseling, substance abuse intensive outpatient program (several hours a day, several days a week), day treatment  Delene Loll, Medicaid, private insurance [phone] [address] [place], [place] [address]  [facility]   Offers substance abuse intensive outpatient program (several hours a day, several days a week), partial hospitalization program [phone] [address] [place], [place] [address]  [phone] [address] [place], [place] [address]  [phone] [address] [place], [place] [address]  [phone] [place], [place] [address]  [facility]  Individual counseling only  Accepts private insurance only [phone] [address] [place], [place] [address]  [facility]  Methadone maintenance program [phone] [address] [place], [place] [address]  [facility]  Walk-In Clinic providing substance abuse and mental health counseling  Accepts Medicaid,  Medicare, private insurance  Offers sliding scale for uninsured [phone] [address] [place], [place]   [facility]  [place], [employer].  Offers individual counseling, and intensive in-home services [phone] [place], [place] [address]  [facility] individual counseling, family counseling, group therapy, domestic violence counseling, consumer credit counseling  Accepts Medicare, Medicaid, private insurance  Offers sliding scale for uninsured [phone] [address] [place], [place] [address]  [phone] [facility], [address] [place], [place] [address]  [facility]  Offers individual, family and group counseling  3 locations - [place], [place], and [place]  [phone]  [address] [place], [place] [address]  [address] [place], [place] [address]  [address] [place], [place] [address]  [facility]    Offers psychiatric assessment, 8-week Intensive Outpatient Program (several hours a day, several times a week, daytime or evenings), early recovery group, family Program, medication management  Private pay or private insurance only [phone], or  [phone] [address] [place], [place] [address]  [place] [employer] individual, couples and family counseling  Accepts Medicaid, private insurance, and sliding scale for uninsured [phone] [address] [place], [place] [address]  Len Blalock, MD  Individual counseling  Private insurance [phone] [address] [place], [place] [address]  [facility]   Offers assessment, substance abuse treatment, and behavioral health treatment [phone] [address] [place], [place] [address]  [facility]  Individual counseling  Accepts private insurance [phone] [address] [place], [place] [address]  [facility]  Individual counseling  Delene Loll, private insurance [phone] [address] [place], [place] [address]  [facility]    Offers intensive outpatient program (several hours a day, several times a week)  Private pay, private insurance [phone] [facility] [place], [place]  [facility]  Individual counseling  Medicare, private insurance [phone] [address] [place], [place] [address]  [facility]    Offers intensive outpatient program (several hours a day, several times a week) and partial hospitalization program [phone] [address] [place], [place] [address]  Emerson Monte, MD  Individual counseling [phone] [address] [place], [place] [address]  [facility]  Offers Christian counseling to individuals, couples, and families  Accepts Medicare and private insurance; offers sliding scale for uninsured [phone] [address] [place], [place] [address]  [facility]  [place] counseling [phone] [address] [place], [place] [address]  [facility] crisis counseling, individual counseling, group therapy, in-home therapy, domestic violence services, day treatment, DWI services, Administrator, arts (CST), Assertive Community Treatment Team (ACTT), substance abuse Intensive Outpatient Program (several hours a day, several times a week)  2 locations - [place] and [place] [phone] [address] [place], [place] [address]  [phone] [address] [place], [place] [address]  [facility]     Individual counseling and group therapy  Accepts private insurance, [place], [place] [phone] [address] [place], [place]  [facility]  Offers individual and family counseling  Offers LGBTQ services  Accepts private insurance and private pay [phone] [address] [place], [place] [address]  [facility]    Offers individual counseling, group  therapy, and outpatient detox  Accepts private insurance [phone] [address] [place], [place]  [facility]  Individual counseling  Accepts Medicare, private insurance [phone] [address] [place], [place] [address]  [employer]  Individual counseling  Accepts Medicare, private insurance [phone] [address] [place], [place] [address]  [facility]   Offers substance abuse Intensive Outpatient Program (several hours a day, several times a week) [phone], or [phone] [place], [place]

## 2015-08-14 NOTE — ED Provider Notes (Signed)
CSN: [id number]     Arrival date & time 08/14/15  0510 History   First MD Initiated Contact with Patient 08/14/15 0534     Chief Complaint  Patient presents with  . Medical Clearance     (Consider location/radiation/quality/duration/timing/severity/associated sxs/prior Treatment) HPI Comments: Patient is a 55 year old male with past medical history of schizoaffective disorder, hypertension, irregular heartbeat. He presents for evaluation of suicidal ideation and depression. He is on multiple psychiatric medications but is intermittently compliant. Patient reports that "everything is wrong is his life" and that "he is going through some shit".  He reports crack cocaine use as well as intermittent alcohol consumption. He also reports hearing voices.  The history is provided by the patient.    Past Medical History  Diagnosis Date  . Irregular heart beat   . Acid reflux   . Hypertension   . COPD (chronic obstructive pulmonary disease) (HCC)   . Arthritis   . Neuropathic pain   . Coronary artery disease   . Asthma   . Bronchitis   . Back pain    History reviewed. No pertinent past surgical history. History reviewed. No pertinent family history. Social History  Substance Use Topics  . Smoking status: Current Every Day Smoker -- 0.50 packs/day  . Smokeless tobacco: None  . Alcohol Use: No    Review of Systems  All other systems reviewed and are negative.     Allergies  Review of patient's allergies indicates no known allergies.  Home Medications   Prior to Admission medications   Medication Sig Start Date End Date Taking? Authorizing Provider  albuterol (PROVENTIL HFA;VENTOLIN HFA) 108 (90 Base) MCG/ACT inhaler Inhale 2 puffs into the lungs every 4 (four) hours as needed for wheezing or shortness of breath. 07/12/15   Rolland Porter, MD  beclomethasone (QVAR) 80 MCG/ACT inhaler Inhale 1 puff into the lungs 2 (two) times daily as needed. 07/12/15   Rolland Porter, MD  benztropine  (COGENTIN) 1 MG tablet Take 1 mg by mouth at bedtime.    Historical Provider, MD  citalopram (CELEXA) 20 MG tablet Take 20 mg by mouth daily.    Historical Provider, MD  esomeprazole (NEXIUM) 40 MG capsule Take 40 mg by mouth daily at 12 noon.    Historical Provider, MD  gabapentin (NEURONTIN) 800 MG tablet Take 1 tablet (800 mg total) by mouth at bedtime. 07/11/15   Antony Madura, PA-C  lisinopril-hydrochlorothiazide (PRINZIDE,ZESTORETIC) 20-25 MG tablet Take 1 tablet by mouth daily. 07/12/15   Rolland Porter, MD  nitroGLYCERIN (NITROSTAT) 0.4 MG SL tablet Place 0.4 mg under the tongue every 5 (five) minutes as needed for chest pain.    Historical Provider, MD  paliperidone (INVEGA) 6 MG 24 hr tablet Take 6 mg by mouth daily.    Historical Provider, MD  predniSONE (DELTASONE) 20 MG tablet Take 3 tablets (60 mg total) by mouth daily. Patient not taking: Reported on 07/11/2015 06/16/15   Tomasita Crumble, MD  traZODone (DESYREL) 100 MG tablet Take 100 mg by mouth at bedtime.     Historical Provider, MD   BP 158/94 mmHg  Pulse 95  Temp(Src) 98.3 F (36.8 C)  Resp 19  Ht  (1.803 m)  Wt 215 lb (97.523 kg)  BMI 30.00 kg/m2  SpO2 98% Physical Exam  Constitutional: He is oriented to person, place, and time. He appears well-developed and well-nourished. No distress.  HENT:  Head: Normocephalic and atraumatic.  Mouth/Throat: Oropharynx is clear and moist.  Eyes: EOM are  normal. Pupils are equal, round, and reactive to light.  Neck: Normal range of motion. Neck supple.  Cardiovascular: Normal rate, regular rhythm and normal heart sounds.   No murmur heard. Pulmonary/Chest: Effort normal and breath sounds normal. No respiratory distress. He has no wheezes. He has no rales.  Abdominal: Soft. Bowel sounds are normal. He exhibits no distension. There is no tenderness.  Musculoskeletal: Normal range of motion. He exhibits no edema.  Neurological: He is alert and oriented to person, place, and time. No cranial  nerve deficit. He exhibits normal muscle tone. Coordination normal.  Skin: Skin is warm and dry. He is not diaphoretic.  Nursing note and vitals reviewed.   ED Course  Procedures (including critical care time) Labs Review Labs Reviewed  COMPREHENSIVE METABOLIC PANEL  ETHANOL  SALICYLATE LEVEL  ACETAMINOPHEN LEVEL  CBC  URINE RAPID DRUG SCREEN, HOSP PERFORMED    Imaging Review No results found. I have personally reviewed and evaluated these images and lab results as part of my medical decision-making.   EKG Interpretation None      MDM   Final diagnoses:  None    Patient presents with complaints of depression and suicidal ideation. He also reports substance abuse issues. He will be evaluated by TTS to determine his final disposition.    Geoffery Lyons, MD 08/14/15 [phone]

## 2015-08-14 NOTE — ED Notes (Signed)
Pt given homeless resource info, subst abuse resource list.  Pt signed a  Energy manager, reports established care through [facility].

## 2015-08-14 NOTE — ED Notes (Signed)
Pt states, "I was going to come here for detox but I decided to come here for mental health instead. I have mental health and I go to [facility]. Everything is wrong. I take every drug. I don't take heroin. I came here becauseI last used last night and that means I have a problem. I have not acted on any of my thoughts but I have had thoughts. Everything is wrong"

## 2015-08-14 NOTE — ED Provider Notes (Signed)
Received care from Dr. Judd Lien at [facility]. Briefly, this is a 55yo male with PMH of schizoaffective disorder, htn, substance abuse, COPD who presents with concern of increased depression and passive suicidal ideation as well as substance abuse.  TTS consulted.  As we were awaiting TTS consult, patient states he would like to leave. Patient reports he no longer has suicidal ideation, and that if symptoms return he will return to the ED.  Patient able to contract for safety.  Feel he is stable for discharge and outpatient treatment and substance abuse treatment. Provided with resources. Pt signed no harm contract. Patient discharged in stable condition with understanding of reasons to return.   Alvira Monday, MD 08/15/15 [phone]

## 2015-08-14 NOTE — ED Notes (Addendum)
This RN explained to pt that partner at bedside was welcome to visit during visiting hours.  Pt asked about possible length of stay.  This RN explained process of TTS, then IP vs OP treatment.  Pt states, "I'm not going to hurt myself.  I'm good. I am ready to go."  Dr. Dalene Seltzer made aware.

## 2016-12-07 ENCOUNTER — Ambulatory Visit: Payer: Self-pay | Admitting: Podiatry

## 2017-04-11 DIAGNOSIS — I428 Other cardiomyopathies: Secondary | ICD-10-CM

## 2017-04-11 HISTORY — DX: Other cardiomyopathies: I42.8

## 2017-06-22 ENCOUNTER — Emergency Department (HOSPITAL_COMMUNITY)
Admission: EM | Admit: 2017-06-22 | Discharge: 2017-06-22 | Disposition: A | Discharge status: Home / Self Care | Payer: Medicaid Other | Attending: Emergency Medicine | Admitting: Emergency Medicine

## 2017-06-22 ENCOUNTER — Emergency Department (HOSPITAL_COMMUNITY): Payer: Medicaid Other

## 2017-06-22 ENCOUNTER — Other Ambulatory Visit: Payer: Self-pay

## 2017-06-22 ENCOUNTER — Encounter (HOSPITAL_COMMUNITY): Payer: Self-pay | Admitting: Emergency Medicine

## 2017-06-22 DIAGNOSIS — M549 Dorsalgia, unspecified: Secondary | ICD-10-CM | POA: Insufficient documentation

## 2017-06-22 DIAGNOSIS — G8929 Other chronic pain: Secondary | ICD-10-CM

## 2017-06-22 DIAGNOSIS — F17210 Nicotine dependence, cigarettes, uncomplicated: Secondary | ICD-10-CM | POA: Diagnosis not present

## 2017-06-22 DIAGNOSIS — J45909 Unspecified asthma, uncomplicated: Secondary | ICD-10-CM | POA: Insufficient documentation

## 2017-06-22 DIAGNOSIS — I2510 Atherosclerotic heart disease of native coronary artery without angina pectoris: Secondary | ICD-10-CM | POA: Insufficient documentation

## 2017-06-22 DIAGNOSIS — I10 Essential (primary) hypertension: Secondary | ICD-10-CM | POA: Diagnosis not present

## 2017-06-22 DIAGNOSIS — M25562 Pain in left knee: Secondary | ICD-10-CM | POA: Diagnosis not present

## 2017-06-22 DIAGNOSIS — R0602 Shortness of breath: Secondary | ICD-10-CM | POA: Insufficient documentation

## 2017-06-22 DIAGNOSIS — J449 Chronic obstructive pulmonary disease, unspecified: Secondary | ICD-10-CM | POA: Diagnosis not present

## 2017-06-22 DIAGNOSIS — R079 Chest pain, unspecified: Secondary | ICD-10-CM | POA: Diagnosis not present

## 2017-06-22 DIAGNOSIS — Z79899 Other long term (current) drug therapy: Secondary | ICD-10-CM | POA: Insufficient documentation

## 2017-06-22 DIAGNOSIS — M25561 Pain in right knee: Secondary | ICD-10-CM | POA: Diagnosis present

## 2017-06-22 LAB — I-STAT TROPONIN, ED
Troponin i, poc: 0 ng/mL (ref 0.00–0.08)
Troponin i, poc: 0 ng/mL (ref 0.00–0.08)

## 2017-06-22 LAB — BASIC METABOLIC PANEL
Anion gap: 10 (ref 5–15)
BUN: 18 mg/dL (ref 6–20)
CO2: 24 mmol/L (ref 22–32)
Calcium: 9.1 mg/dL (ref 8.9–10.3)
Chloride: 103 mmol/L (ref 101–111)
Creatinine, Ser: 1.18 mg/dL (ref 0.61–1.24)
GFR calc Af Amer: >60 mL/min (ref >60)
GFR calc non Af Amer: >60 mL/min (ref >60)
Glucose, Bld: 119 mg/dL — ABNORMAL HIGH (ref 65–99)
Potassium: 3.6 mmol/L (ref 3.5–5.1)
Sodium: 137 mmol/L (ref 135–145)

## 2017-06-22 LAB — CBC
HCT: 43.3 % (ref 39.0–52.0)
Hemoglobin: 14 g/dL (ref 13.0–17.0)
MCH: 29.4 pg (ref 26.0–34.0)
MCHC: 32.3 g/dL (ref 30.0–36.0)
MCV: 91 fL (ref 78.0–100.0)
PLATELETS: 226 10*3/uL (ref 150–400)
RBC: 4.76 MIL/uL (ref 4.22–5.81)
RDW: 14.2 % (ref 11.5–15.5)
WBC: 6.2 10*3/uL (ref 4.0–10.5)

## 2017-06-22 MED ORDER — PREDNISONE 20 MG PO TABS
60.0000 mg | ORAL_TABLET | Freq: Once | ORAL | Status: DC
  Filled 2017-06-22: qty 3

## 2017-06-22 MED ORDER — BENZTROPINE MESYLATE 1 MG PO TABS: 1.0000 mg | ORAL_TABLET | Freq: Every day | ORAL | 0 refills | Status: DC

## 2017-06-22 MED ORDER — BECLOMETHASONE DIPROPIONATE 80 MCG/ACT IN AERS: 1.0000 | INHALATION_SPRAY | Freq: Two times a day (BID) | RESPIRATORY_TRACT | 1 refills | Status: DC | PRN

## 2017-06-22 MED ORDER — CITALOPRAM HYDROBROMIDE 20 MG PO TABS: 20.0000 mg | ORAL_TABLET | Freq: Every day | ORAL | 0 refills | Status: DC

## 2017-06-22 MED ORDER — ACETAMINOPHEN 325 MG PO TABS
650.0000 mg | ORAL_TABLET | Freq: Once | ORAL | Status: DC
  Filled 2017-06-22: qty 2

## 2017-06-22 MED ORDER — LISINOPRIL-HYDROCHLOROTHIAZIDE 20-25 MG PO TABS: 1.0000 | ORAL_TABLET | Freq: Every day | ORAL | 0 refills | Status: DC

## 2017-06-22 MED ORDER — IPRATROPIUM-ALBUTEROL 0.5-2.5 (3) MG/3ML IN SOLN
3.0000 mL | Freq: Once | RESPIRATORY_TRACT | Status: DC
Start: 2017-06-22 — End: 2017-06-22
  Filled 2017-06-22: qty 3

## 2017-06-22 MED ORDER — ASPIRIN 81 MG PO CHEW
324.0000 mg | CHEWABLE_TABLET | Freq: Once | ORAL | Status: DC
  Filled 2017-06-22: qty 4

## 2017-06-22 MED ORDER — ESOMEPRAZOLE MAGNESIUM 40 MG PO CPDR: 40.0000 mg | DELAYED_RELEASE_CAPSULE | Freq: Every day | ORAL | 0 refills | Status: DC

## 2017-06-22 MED ORDER — PALIPERIDONE ER 6 MG PO TB24: 6.0000 mg | ORAL_TABLET | Freq: Every day | ORAL | 0 refills | Status: DC

## 2017-06-22 MED ORDER — ALBUTEROL SULFATE HFA 108 (90 BASE) MCG/ACT IN AERS: 2.0000 | INHALATION_SPRAY | RESPIRATORY_TRACT | 1 refills | Status: DC | PRN

## 2017-06-22 NOTE — ED Notes (Signed)
PA discussed options with patient and patient was unhappy. Patient refused to sign paperwork and stated he would "check back in and talk with a real doctor". Patient walked to lobby.

## 2017-06-22 NOTE — ED Notes (Signed)
Called pt name 3x to recheck vitals, no response from pt

## 2017-06-22 NOTE — ED Notes (Signed)
Patient had left the room and walked out of the ED. After room was clean patient returned and asked for his prescriptions. Patient was given discharge paper and started walking out the door but returned complaining about his prescriptions. Patient asked to see the PA and her supervisor. MD notified.

## 2017-06-22 NOTE — ED Provider Notes (Signed)
[facility] EMERGENCY DEPARTMENT Provider Note   CSN: [id number] Arrival date & time: 06/22/17  0111     History   Chief Complaint Chief Complaint  Patient presents with  . Chest Pain      HPI   Blood pressure (!) 155/98, pulse 64, temperature 97.9 F (36.6 C), temperature source Oral, resp. rate 16, height 6' (1.829 m), weight 104.3 kg (230 lb), SpO2 98 %.  Allen Mayer is a 57 y.o. male with past medical history significant for COPD, smoking, CAD, hypertension brought in by EMS for diffuse pain, he states that he has pain in his knees, his back, his chest.  On review of systems he notes shortness of breath with no cough, fever, increasing peripheral edema, orthopnea, PND.  He states his primary care doctor Dr. Arvil Chaco abruptly closed his practice and he has not had any medications since the fourth.   Past Medical History:  Diagnosis Date  . Acid reflux   . Arthritis   . Asthma   . Back pain   . Bronchitis   . COPD (chronic obstructive pulmonary disease) (HCC)   . Coronary artery disease   . Hypertension   . Irregular heart beat   . Neuropathic pain     Patient Active Problem List   Diagnosis Date Noted  . 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 Tobacco abuse 11/19/2014  . Essential hypertension, benign 11/19/2014  . Neuropathic pain     History reviewed. No pertinent surgical history.     Home Medications    Prior to Admission medications   Medication Sig Start Date End Date Taking? Authorizing Provider  gabapentin (NEURONTIN) 800 MG tablet Take 1 tablet (800 mg total) by mouth at bedtime. 07/11/15  Yes Antony Madura, PA-C  Oxycodone HCl 10 MG TABS Take 10 mg by mouth 3 (three) times daily. Pain  Last filled 05-15-17. #90 05/15/17  Yes [provider]  traZODone (DESYREL) 100 MG tablet Take 100 mg by mouth at bedtime.    Yes [provider]  albuterol (PROVENTIL  HFA;VENTOLIN HFA) 108 (90 Base) MCG/ACT inhaler Inhale 2 puffs into the lungs every 4 (four) hours as needed for wheezing or shortness of breath. 06/22/17   Angelyne Terwilliger, Joni Reining, PA-C  beclomethasone (QVAR) 80 MCG/ACT inhaler Inhale 1 puff into the lungs 2 (two) times daily as needed. 06/22/17   Oliveah Zwack, Joni Reining, PA-C  benztropine (COGENTIN) 1 MG tablet Take 1 tablet (1 mg total) by mouth at bedtime. 06/22/17   Haim Hansson, Joni Reining, PA-C  citalopram (CELEXA) 20 MG tablet Take 1 tablet (20 mg total) by mouth daily. 06/22/17   Dally Oshel, Joni Reining, PA-C  esomeprazole (NEXIUM) 40 MG capsule Take 1 capsule (40 mg total) by mouth daily at 12 noon. 06/22/17   Omarion Minnehan, Joni Reining, PA-C  lisinopril-hydrochlorothiazide (PRINZIDE,ZESTORETIC) 20-25 MG tablet Take 1 tablet by mouth daily. 06/22/17   Miana Politte, Joni Reining, PA-C  nitroGLYCERIN (NITROSTAT) 0.4 MG SL tablet Place 0.4 mg under the tongue every 5 (five) minutes as needed for chest pain.    [provider]  paliperidone (INVEGA) 6 MG 24 hr tablet Take 1 tablet (6 mg total) by mouth daily. 06/22/17   Metha Kolasa, Mardella Layman    Family History No family history on file.  Social History Social History   Tobacco Use  . Smoking status: Current Every Day Smoker    Packs/day: 0.50  Substance Use Topics  . Alcohol use: No  . Drug use: No  Allergies   Patient has no known allergies.   Review of Systems Review of Systems  A complete review of systems was obtained and all systems are negative except as noted in the HPI and PMH.   Physical Exam Updated Vital Signs BP (!) 155/98 (BP Location: Right Arm)   Pulse 64   Temp 97.9 F (36.6 C) (Oral)   Resp 16   Ht 6' (1.829 m)   Wt 104.3 kg (230 lb)   SpO2 98%   BMI 31.19 kg/m   Physical Exam  Constitutional: He is oriented to person, place, and time. He appears well-developed and well-nourished. No distress.  HENT:  Head: Normocephalic and atraumatic.  Mouth/Throat: Oropharynx is clear and  moist.  Eyes: Conjunctivae and EOM are normal. Pupils are equal, round, and reactive to light.  Neck: Normal range of motion.  Cardiovascular: Normal rate, regular rhythm and intact distal pulses.  Pulmonary/Chest: Effort normal. He has wheezes.  Abdominal: Soft. There is no tenderness.  Musculoskeletal: Normal range of motion.  Neurological: He is alert and oriented to person, place, and time.  Skin: He is not diaphoretic.  Psychiatric: He has a normal mood and affect.  Nursing note and vitals reviewed.    ED Treatments / Results  Labs (all labs ordered are listed, but only abnormal results are displayed) Labs Reviewed  BASIC METABOLIC PANEL - Abnormal; Notable for the following components:      Result Value   Glucose, Bld 119 (*)    All other components within normal limits  CBC  I-STAT TROPONIN, ED  I-STAT TROPONIN, ED    EKG  EKG Interpretation  Date/Time:  Thursday June 22 2017 01:12:48 EDT Ventricular Rate:  58 PR Interval:  180 QRS Duration: 96 QT Interval:  408 QTC Calculation: 400 R Axis:   5 Text Interpretation:  Sinus bradycardia T wave abnormality, consider inferolateral ischemia Abnormal ECG When compared with ECG of 06/16/2015, T wave inversion in the inferolateral leads is now present Confirmed by Dione Booze ([id number]) on 06/22/2017 7:33:13 AM       Radiology Dg Chest 2 View  Result Date: 06/22/2017 CLINICAL DATA:  57 year old male with chest pain. EXAM: CHEST - 2 VIEW COMPARISON:  Chest radiograph dated 06/16/2015 FINDINGS: The heart size and mediastinal contours are within normal limits. Both lungs are clear. The visualized skeletal structures are unremarkable. IMPRESSION: No active cardiopulmonary disease. Electronically Signed   By: Elgie Collard M.D.   On: 06/22/2017 02:18    Procedures Procedures (including critical care time)  Medications Ordered in ED Medications  acetaminophen (TYLENOL) tablet 650 mg (not administered)  predniSONE  (DELTASONE) tablet 60 mg (not administered)  ipratropium-albuterol (DUONEB) 0.5-2.5 (3) MG/3ML nebulizer solution 3 mL (not administered)  aspirin chewable tablet 324 mg (not administered)     Initial Impression / Assessment and Plan / ED Course  I have reviewed the triage vital signs and the nursing notes.  Pertinent labs & imaging results that were available during my care of the patient were reviewed by me and considered in my medical decision making (see chart for details).     Vitals:   06/22/17 0113 06/22/17 0114 06/22/17 0115 06/22/17 0829  BP:  (!) 165/97  (!) 155/98  Pulse:  62  64  Resp:  18  16  Temp:  97.9 F (36.6 C)    TempSrc:  Oral    SpO2: 98% 99%  98%  Weight:   104.3 kg (230 lb)   Height:  6' (1.829 m)     Medications  acetaminophen (TYLENOL) tablet 650 mg (not administered)  predniSONE (DELTASONE) tablet 60 mg (not administered)  ipratropium-albuterol (DUONEB) 0.5-2.5 (3) MG/3ML nebulizer solution 3 mL (not administered)  aspirin chewable tablet 324 mg (not administered)    Jeyren Lynds is 57 y.o. male presenting with diffuse pain, per EMS he stated he had chest pain after he was told that on emergency transport was not available.  On my exam patient states that he has pain in his knees, his back and then he adds in chest pain at the end.  His EKG shows new T wave inversions from last EKG 2 years ago however given his 2- troponins I doubt that his pain is cardiac in nature.  He states that he does feel short of breath on review of systems, patient is slightly wheezing on my exam.  This patient's primary care doctor has had their license revoked recently.  I have contacted case management to try to set up this patient with appropriate outpatient care.  I have attempted to refill most of his medications.  On discharge I was informed that this patient eloped from the ED.  Evaluation does not show pathology that would require ongoing emergent intervention or  inpatient treatment. Pt is hemodynamically stable and mentating appropriately. Discussed findings and plan with patient/guardian, who agrees with care plan. All questions answered. Return precautions discussed and outpatient follow up given.      Final Clinical Impressions(s) / ED Diagnoses   Final diagnoses:  Other chronic pain    ED Discharge Orders        Ordered    albuterol (PROVENTIL HFA;VENTOLIN HFA) 108 (90 Base) MCG/ACT inhaler  Every 4 hours PRN     06/22/17 0912    beclomethasone (QVAR) 80 MCG/ACT inhaler  2 times daily PRN     06/22/17 0912    esomeprazole (NEXIUM) 40 MG capsule  Daily     06/22/17 0912    lisinopril-hydrochlorothiazide (PRINZIDE,ZESTORETIC) 20-25 MG tablet  Daily     06/22/17 0912    paliperidone (INVEGA) 6 MG 24 hr tablet  Daily     06/22/17 0912    benztropine (COGENTIN) 1 MG tablet  Daily at bedtime     06/22/17 0912    citalopram (CELEXA) 20 MG tablet  Daily     06/22/17 0912       Arlesia Kiel, Mardella Layman 06/22/17 0919    Raeford Razor, MD 06/22/17 1515

## 2017-06-22 NOTE — Discharge Instructions (Signed)
Please follow with your primary care doctor in the next 2 days for a check-up. They must obtain records for further management.  ° °Do not hesitate to return to the Emergency Department for any new, worsening or concerning symptoms.  ° °

## 2017-06-22 NOTE — ED Triage Notes (Signed)
[facility] from [facility], called out for "pain all over" EMS informed pt that they did not have a truck available at this time so pt stated he was having CP so he could be transported sooner.

## 2017-06-22 NOTE — ED Notes (Signed)
Patient found sleeping in lobby, un-dicharged and advised of wait

## 2017-07-13 ENCOUNTER — Emergency Department (HOSPITAL_COMMUNITY)
Admission: EM | Admit: 2017-07-13 | Discharge: 2017-07-14 | Disposition: A | Discharge status: Home / Self Care | Payer: Medicaid Other | Attending: Emergency Medicine | Admitting: Emergency Medicine

## 2017-07-13 ENCOUNTER — Encounter (HOSPITAL_COMMUNITY): Payer: Self-pay | Admitting: Emergency Medicine

## 2017-07-13 DIAGNOSIS — I25119 Atherosclerotic heart disease of native coronary artery with unspecified angina pectoris: Secondary | ICD-10-CM | POA: Diagnosis not present

## 2017-07-13 DIAGNOSIS — M6282 Rhabdomyolysis: Secondary | ICD-10-CM | POA: Diagnosis not present

## 2017-07-13 DIAGNOSIS — R52 Pain, unspecified: Secondary | ICD-10-CM | POA: Diagnosis present

## 2017-07-13 DIAGNOSIS — J449 Chronic obstructive pulmonary disease, unspecified: Secondary | ICD-10-CM | POA: Insufficient documentation

## 2017-07-13 DIAGNOSIS — I10 Essential (primary) hypertension: Secondary | ICD-10-CM | POA: Diagnosis not present

## 2017-07-13 DIAGNOSIS — Z79899 Other long term (current) drug therapy: Secondary | ICD-10-CM | POA: Diagnosis not present

## 2017-07-13 DIAGNOSIS — F17210 Nicotine dependence, cigarettes, uncomplicated: Secondary | ICD-10-CM | POA: Insufficient documentation

## 2017-07-13 LAB — CBC WITH DIFFERENTIAL/PLATELET
BASOS PCT: 1 %
Basophils Absolute: 0.1 10*3/uL (ref 0.0–0.1)
EOS ABS: 0.2 10*3/uL (ref 0.0–0.7)
Eosinophils Relative: 3 %
HEMATOCRIT: 45 % (ref 39.0–52.0)
Hemoglobin: 14.5 g/dL (ref 13.0–17.0)
Lymphocytes Relative: 45 %
Lymphs Abs: 2.6 10*3/uL (ref 0.7–4.0)
MCH: 29.2 pg (ref 26.0–34.0)
MCHC: 32.2 g/dL (ref 30.0–36.0)
MCV: 90.5 fL (ref 78.0–100.0)
MONOS PCT: 8 %
Monocytes Absolute: 0.5 10*3/uL (ref 0.1–1.0)
NEUTROS ABS: 2.5 10*3/uL (ref 1.7–7.7)
Neutrophils Relative %: 43 %
Platelets: 233 10*3/uL (ref 150–400)
RBC: 4.97 MIL/uL (ref 4.22–5.81)
RDW: 14.5 % (ref 11.5–15.5)
WBC: 5.8 10*3/uL (ref 4.0–10.5)

## 2017-07-13 LAB — COMPREHENSIVE METABOLIC PANEL
ALK PHOS: 70 U/L (ref 38–126)
ALT: 34 U/L (ref 17–63)
ANION GAP: 8 (ref 5–15)
AST: 44 U/L — ABNORMAL HIGH (ref 15–41)
Albumin: 3.9 g/dL (ref 3.5–5.0)
BUN: 15 mg/dL (ref 6–20)
CALCIUM: 9 mg/dL (ref 8.9–10.3)
CHLORIDE: 104 mmol/L (ref 101–111)
CO2: 25 mmol/L (ref 22–32)
CREATININE: 1.25 mg/dL — AB (ref 0.61–1.24)
GFR calc Af Amer: >60 mL/min (ref >60)
Glucose, Bld: 119 mg/dL — ABNORMAL HIGH (ref 65–99)
Potassium: 3.9 mmol/L (ref 3.5–5.1)
SODIUM: 137 mmol/L (ref 135–145)
Total Bilirubin: 0.6 mg/dL (ref 0.3–1.2)
Total Protein: 6.6 g/dL (ref 6.5–8.1)

## 2017-07-13 NOTE — ED Provider Notes (Signed)
Patient placed in Quick Look pathway, seen and evaluated   Chief Complaint: "Cramping up in my legs and all over my body."   HPI:   Patient presents today for about a week of cramps.  They are worse in his feet and legs but happen in his arms also.  He reports compliance with his medications, denies trauma.    ROS: Muscle cramps, no fevers or chills.    Physical Exam:   Gen: No distress  Neuro: Awake and Alert  Skin: Warm    Focused Exam: Patient is able to move toes and legs.     Initiation of care has begun. The patient has been counseled on the process, plan, and necessity for staying for the completion/evaluation, and the remainder of the medical screening examination    Allen Mayer 07/13/17 2022    Derwood Kaplan, MD 07/15/17 [phone]

## 2017-07-13 NOTE — ED Triage Notes (Signed)
Pt reports gen body aches/cramping X3-4 days.

## 2017-07-14 LAB — URINALYSIS, ROUTINE W REFLEX MICROSCOPIC
BILIRUBIN URINE: NEGATIVE
Bacteria, UA: NONE SEEN
GLUCOSE, UA: NEGATIVE mg/dL
KETONES UR: NEGATIVE mg/dL
LEUKOCYTES UA: NEGATIVE
NITRITE: NEGATIVE
PH: 7 (ref 5.0–8.0)
Protein, ur: NEGATIVE mg/dL
SPECIFIC GRAVITY, URINE: 1.006 (ref 1.005–1.030)

## 2017-07-14 LAB — CK
Total CK: 1817 U/L — ABNORMAL HIGH (ref 49–397)
Total CK: 1244 U/L — ABNORMAL HIGH (ref 49–397)

## 2017-07-14 LAB — BASIC METABOLIC PANEL
Anion gap: 7 (ref 5–15)
BUN: 11 mg/dL (ref 6–20)
CHLORIDE: 108 mmol/L (ref 101–111)
CO2: 22 mmol/L (ref 22–32)
CREATININE: 1.07 mg/dL (ref 0.61–1.24)
Calcium: 8.6 mg/dL — ABNORMAL LOW (ref 8.9–10.3)
GFR calc non Af Amer: >60 mL/min (ref >60)
Glucose, Bld: 127 mg/dL — ABNORMAL HIGH (ref 65–99)
POTASSIUM: 4.3 mmol/L (ref 3.5–5.1)
Sodium: 137 mmol/L (ref 135–145)

## 2017-07-14 MED ORDER — SODIUM CHLORIDE 0.9 % IV BOLUS
1000.0000 mL | Freq: Once | INTRAVENOUS | Status: AC
  Administered 2017-07-14: 1000 mL via INTRAVENOUS

## 2017-07-14 NOTE — ED Notes (Signed)
Pt verbalized understanding discharge instructions and denies any further needs or questions at this time. VS stable, ambulatory and steady gait.   

## 2017-07-14 NOTE — Discharge Instructions (Signed)
Your work-up today showed evidence of a rhabdomyolysis which we suspect is due to your increased walking workout regimen for the last few weeks.  Please discontinue this regimen and follow-up with your primary doctor.  Please push extreme hydration for the next week.  If you have any new or worsened symptoms, please return to the nearest emergency department.

## 2017-07-14 NOTE — ED Notes (Signed)
Pt sitting on stretcher with monitoring equipment off. Pt asked to go out and smoke cigarette. RN persuaded to stay in room and will ask provider what next step will be. Per RN Wendy Pt to be discharged once labs result.

## 2017-07-14 NOTE — ED Provider Notes (Signed)
[facility] EMERGENCY DEPARTMENT Provider Note   CSN: [id number] Arrival date & time: 07/13/17  1940     History   Chief Complaint Chief Complaint  Patient presents with  . Body Aches    HPI Allen Mayer is a 57 y.o. male.  The history is provided by the patient, the spouse and medical records. No language interpreter was used.  Muscle Pain  This is a new problem. The current episode started more than 1 week ago. The problem occurs constantly. The problem has not changed since onset.Pertinent negatives include no chest pain, no abdominal pain, no headaches and no shortness of breath. The symptoms are aggravated by walking. Nothing relieves the symptoms. He has tried nothing for the symptoms. The treatment provided no relief.    Past Medical History:  Diagnosis Date  . Acid reflux   . Arthritis   . Asthma   . Back pain   . Bronchitis   . COPD (chronic obstructive pulmonary disease) (HCC)   . Coronary artery disease   . Hypertension   . Irregular heart beat   . Neuropathic pain     Patient Active Problem List   Diagnosis Date Noted  . 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 Tobacco abuse 11/19/2014  . Essential hypertension, benign 11/19/2014  . Neuropathic pain     History reviewed. No pertinent surgical history.      Home Medications    Prior to Admission medications   Medication Sig Start Date End Date Taking? Authorizing Provider  albuterol (PROVENTIL HFA;VENTOLIN HFA) 108 (90 Base) MCG/ACT inhaler Inhale 2 puffs into the lungs every 4 (four) hours as needed for wheezing or shortness of breath. 06/22/17   Pisciotta, Joni Reining, PA-C  beclomethasone (QVAR) 80 MCG/ACT inhaler Inhale 1 puff into the lungs 2 (two) times daily as needed. 06/22/17   Pisciotta, Joni Reining, PA-C  benztropine (COGENTIN) 1 MG tablet Take 1 tablet (1 mg total) by mouth at bedtime. 06/22/17   Pisciotta, Joni Reining, PA-C    citalopram (CELEXA) 20 MG tablet Take 1 tablet (20 mg total) by mouth daily. 06/22/17   Pisciotta, Joni Reining, PA-C  esomeprazole (NEXIUM) 40 MG capsule Take 1 capsule (40 mg total) by mouth daily at 12 noon. 06/22/17   Pisciotta, Joni Reining, PA-C  gabapentin (NEURONTIN) 800 MG tablet Take 1 tablet (800 mg total) by mouth at bedtime. 07/11/15   Antony Madura, PA-C  lisinopril-hydrochlorothiazide (PRINZIDE,ZESTORETIC) 20-25 MG tablet Take 1 tablet by mouth daily. 06/22/17   Pisciotta, Joni Reining, PA-C  nitroGLYCERIN (NITROSTAT) 0.4 MG SL tablet Place 0.4 mg under the tongue every 5 (five) minutes as needed for chest pain.    [provider]  Oxycodone HCl 10 MG TABS Take 10 mg by mouth 3 (three) times daily. Pain  Last filled 05-15-17. #90 05/15/17   [provider]  paliperidone (INVEGA) 6 MG 24 hr tablet Take 1 tablet (6 mg total) by mouth daily. 06/22/17   Pisciotta, Joni Reining, PA-C  traZODone (DESYREL) 100 MG tablet Take 100 mg by mouth at bedtime.     [provider]    Family History No family history on file.  Social History Social History   Tobacco Use  . Smoking status: Current Every Day Smoker    Packs/day: 0.50  . Smokeless tobacco: Never Used  Substance Use Topics  . Alcohol use: No  . Drug use: No    Types: Cocaine, Marijuana     Allergies   Patient has  no known allergies.   Review of Systems Review of Systems  Constitutional: Negative for chills, diaphoresis, fatigue and fever.  HENT: Negative for congestion.   Eyes: Negative for visual disturbance.  Respiratory: Negative for cough, chest tightness, shortness of breath, wheezing and stridor.   Cardiovascular: Negative for chest pain, palpitations and leg swelling.  Gastrointestinal: Negative for abdominal pain, constipation, diarrhea, nausea and vomiting.  Genitourinary: Negative for flank pain and frequency.  Musculoskeletal: Positive for myalgias. Negative for back pain, neck pain and neck stiffness.  Skin:  Negative for pallor.  Neurological: Negative for light-headedness and headaches.  Psychiatric/Behavioral: Negative for agitation and confusion.  All other systems reviewed and are negative.    Physical Exam Updated Vital Signs BP (!) 149/75   Pulse 72   Temp 98.2 F (36.8 C) (Oral)   Resp 14   Ht 6' (1.829 m)   Wt 102.1 kg (225 lb)   SpO2 99%   BMI 30.52 kg/m   Physical Exam  Constitutional: He appears well-developed and well-nourished. No distress.  HENT:  Head: Normocephalic and atraumatic.  Nose: Nose normal.  Mouth/Throat: Oropharynx is clear and moist.  Eyes: Pupils are equal, round, and reactive to light. Conjunctivae and EOM are normal.  Neck: Normal range of motion. Neck supple.  Cardiovascular: Normal rate and intact distal pulses.  No murmur heard. Pulmonary/Chest: Effort normal and breath sounds normal. No respiratory distress. He has no wheezes. He has no rales. He exhibits no tenderness.  Abdominal: Soft. He exhibits no distension. There is no tenderness.  Musculoskeletal: He exhibits tenderness. He exhibits no edema.  Neurological: No sensory deficit. He exhibits normal muscle tone.  Skin: Capillary refill takes less than 2 seconds. He is not diaphoretic. No erythema. No pallor.  Psychiatric: He has a normal mood and affect.  Nursing note and vitals reviewed.    ED Treatments / Results  Labs (all labs ordered are listed, but only abnormal results are displayed) Labs Reviewed  COMPREHENSIVE METABOLIC PANEL - Abnormal; Notable for the following components:      Result Value   Glucose, Bld 119 (*)    Creatinine, Ser 1.25 (*)    AST 44 (*)    All other components within normal limits  CK - Abnormal; Notable for the following components:   Total CK 1,817 (*)    All other components within normal limits  URINALYSIS, ROUTINE W REFLEX MICROSCOPIC - Abnormal; Notable for the following components:   Color, Urine STRAW (*)    Hgb urine dipstick SMALL (*)     Squamous Epithelial / LPF 0-5 (*)    All other components within normal limits  CK - Abnormal; Notable for the following components:   Total CK 1,244 (*)    All other components within normal limits  BASIC METABOLIC PANEL - Abnormal; Notable for the following components:   Glucose, Bld 127 (*)    Calcium 8.6 (*)    All other components within normal limits  URINE CULTURE  CBC WITH DIFFERENTIAL/PLATELET    EKG None  Radiology No results found.  Procedures Procedures (including critical care time)  Medications Ordered in ED Medications  sodium chloride 0.9 % bolus 1,000 mL (0 mLs Intravenous Stopped 07/14/17 0936)  sodium chloride 0.9 % bolus 1,000 mL (0 mLs Intravenous Stopped 07/14/17 0936)     Initial Impression / Assessment and Plan / ED Course  I have reviewed the triage vital signs and the nursing notes.  Pertinent labs & imaging results that  were available during my care of the patient were reviewed by me and considered in my medical decision making (see chart for details).     Allen Mayer is a 57 y.o. male with a past medical history significant for COPD, hypertension, and CAD who presents with diffuse body cramping and aching.  Patient reports that he has had diffuse myalgias and cramping of all muscles in his arms and legs for the last month.  He reports that he has had no recent medication changes.  He does report that over the last month he started walking more for exercise.  He reports that he has been walking several miles a day which is different for him over the last few weeks.  He reports that this coincides with when he has had his cramping.  He reports that last night his cramping was so severe that he could barely ambulate.  He reports that he has had no fevers or chills.  He denies any chest pain palpitations or shortness of breath.  He denies recent URI symptoms.  He denies nausea vomiting, conservation, or diarrhea.  He denies any urine changes.  He reports  that his abdominal cramps and aches are an 8 out of 10 severity.  He describes them as moderate to severe.  He denies any new medication changes.  On exam, patient had tenderness and soreness in his muscles of the legs and arms.  No chest tenderness or abdominal tenderness.  No back tenderness palpated.  Lungs clear.  Abdomen had normal bowel sounds.  No murmurs appreciated.  Patient appeared well.  Patient is screening laboratory tests during triage.  CBC reassuring and CMP showed slight elevation in creatinine up to 1.25 from prior.  Most concerning was elevated CK of 1800.  Suspect patient is in mild rhabdo with slight bump in creatinine in the setting of dehydration.  Suspect this is likely due to the patient's new exercise regimen of significant walking.  Patient will be rehydrated and labs will be repeated.  If patient's symptoms and numbers are improving, patient would likely be stable for discharge home.  His CK is worsening or creatinine is worsening, patient may need admission for rehydration for rhabdo.  On repeat lab testing, creatinine has normalized and CK is improving.  Patient is able to tolerate eating and drinking normally.  Suspect patient will be able to maintain hydration at home.  Patient encouraged to follow-up with his PCP in several days for creatinine recheck and to stay hydrated.  Patient will also avoid any strenuous walking exercise he has been doing.  Patient had no other questions or concerns and understood return precautions.  Patient discharged in good condition.   Final Clinical Impressions(s) / ED Diagnoses   Final diagnoses:  Non-traumatic rhabdomyolysis    ED Discharge Orders    None      Clinical Impression: 1. Non-traumatic rhabdomyolysis     Disposition: Discharge  Condition: Good  I have discussed the results, Dx and Tx plan with the pt(& family if present). He/she/they expressed understanding and agree(s) with the plan. Discharge  instructions discussed at great length. Strict return precautions discussed and pt &/or family have verbalized understanding of the instructions. No further questions at time of discharge.    Discharge Medication List as of 07/14/2017 12:19 PM      Follow Up: [facility] [address] [place] [address] [phone] Schedule an appointment as soon as possible for a visit  [facility] EMERGENCY DEPARTMENT [address] [id number] mc [place] [address] [phone]       Kobee Medlen, Canary Brim, MD 07/14/17 [phone]

## 2017-07-15 LAB — URINE CULTURE: CULTURE: NO GROWTH

## 2017-12-04 ENCOUNTER — Encounter (HOSPITAL_COMMUNITY): Payer: Self-pay | Admitting: *Deleted

## 2017-12-04 ENCOUNTER — Emergency Department (HOSPITAL_COMMUNITY): Payer: Medicaid Other

## 2017-12-04 ENCOUNTER — Other Ambulatory Visit: Payer: Self-pay

## 2017-12-04 ENCOUNTER — Emergency Department (HOSPITAL_COMMUNITY)
Admission: EM | Admit: 2017-12-04 | Discharge: 2017-12-04 | Disposition: A | Discharge status: Home / Self Care | Payer: Medicaid Other | Attending: Emergency Medicine | Admitting: Emergency Medicine

## 2017-12-04 DIAGNOSIS — I10 Essential (primary) hypertension: Secondary | ICD-10-CM | POA: Diagnosis not present

## 2017-12-04 DIAGNOSIS — Z79899 Other long term (current) drug therapy: Secondary | ICD-10-CM | POA: Insufficient documentation

## 2017-12-04 DIAGNOSIS — F17200 Nicotine dependence, unspecified, uncomplicated: Secondary | ICD-10-CM | POA: Diagnosis not present

## 2017-12-04 DIAGNOSIS — J441 Chronic obstructive pulmonary disease with (acute) exacerbation: Secondary | ICD-10-CM | POA: Diagnosis not present

## 2017-12-04 DIAGNOSIS — I2510 Atherosclerotic heart disease of native coronary artery without angina pectoris: Secondary | ICD-10-CM | POA: Diagnosis not present

## 2017-12-04 DIAGNOSIS — R0602 Shortness of breath: Secondary | ICD-10-CM | POA: Diagnosis present

## 2017-12-04 LAB — CBC WITH DIFFERENTIAL/PLATELET
Abs Immature Granulocytes: 0 10*3/uL (ref 0.0–0.1)
Basophils Absolute: 0 10*3/uL (ref 0.0–0.1)
Basophils Relative: 1 %
EOS ABS: 0.3 10*3/uL (ref 0.0–0.7)
Eosinophils Relative: 5 %
HEMATOCRIT: 47 % (ref 39.0–52.0)
Hemoglobin: 14.9 g/dL (ref 13.0–17.0)
IMMATURE GRANULOCYTES: 0 %
LYMPHS ABS: 2.2 10*3/uL (ref 0.7–4.0)
Lymphocytes Relative: 36 %
MCH: 29.9 pg (ref 26.0–34.0)
MCHC: 31.7 g/dL (ref 30.0–36.0)
MCV: 94.4 fL (ref 78.0–100.0)
MONO ABS: 0.5 10*3/uL (ref 0.1–1.0)
MONOS PCT: 9 %
NEUTROS PCT: 49 %
Neutro Abs: 3 10*3/uL (ref 1.7–7.7)
Platelets: 192 10*3/uL (ref 150–400)
RBC: 4.98 MIL/uL (ref 4.22–5.81)
RDW: 14.2 % (ref 11.5–15.5)
WBC: 6.1 10*3/uL (ref 4.0–10.5)

## 2017-12-04 LAB — URINALYSIS, ROUTINE W REFLEX MICROSCOPIC
BACTERIA UA: NONE SEEN
Bilirubin Urine: NEGATIVE
Glucose, UA: NEGATIVE mg/dL
Ketones, ur: NEGATIVE mg/dL
Nitrite: NEGATIVE
PH: 5 (ref 5.0–8.0)
Protein, ur: NEGATIVE mg/dL
SPECIFIC GRAVITY, URINE: 1.009 (ref 1.005–1.030)

## 2017-12-04 LAB — COMPREHENSIVE METABOLIC PANEL
ALBUMIN: 3.4 g/dL — AB (ref 3.5–5.0)
ALK PHOS: 71 U/L (ref 38–126)
ALT: 50 U/L — AB (ref 0–44)
AST: 40 U/L (ref 15–41)
Anion gap: 9 (ref 5–15)
BILIRUBIN TOTAL: 0.5 mg/dL (ref 0.3–1.2)
BUN: 15 mg/dL (ref 6–20)
CALCIUM: 9.2 mg/dL (ref 8.9–10.3)
CO2: 21 mmol/L — ABNORMAL LOW (ref 22–32)
CREATININE: 1.27 mg/dL — AB (ref 0.61–1.24)
Chloride: 109 mmol/L (ref 98–111)
GFR calc Af Amer: >60 mL/min (ref >60)
GFR calc non Af Amer: >60 mL/min (ref >60)
GLUCOSE: 111 mg/dL — AB (ref 70–99)
Potassium: 4.4 mmol/L (ref 3.5–5.1)
Sodium: 139 mmol/L (ref 135–145)
Total Protein: 6.1 g/dL — ABNORMAL LOW (ref 6.5–8.1)

## 2017-12-04 LAB — CK: Total CK: 599 U/L — ABNORMAL HIGH (ref 49–397)

## 2017-12-04 LAB — I-STAT TROPONIN, ED: Troponin i, poc: 0.05 ng/mL (ref 0.00–0.08)

## 2017-12-04 MED ORDER — SODIUM CHLORIDE 0.9 % IV BOLUS
1000.0000 mL | Freq: Once | INTRAVENOUS | Status: AC
  Administered 2017-12-04: 1000 mL via INTRAVENOUS

## 2017-12-04 MED ORDER — IPRATROPIUM BROMIDE 0.02 % IN SOLN
0.5000 mg | Freq: Once | RESPIRATORY_TRACT | Status: AC
Start: 2017-12-04 — End: 2017-12-04
  Administered 2017-12-04: 0.5 mg via RESPIRATORY_TRACT
  Filled 2017-12-04: qty 2.5

## 2017-12-04 MED ORDER — ALBUTEROL SULFATE (2.5 MG/3ML) 0.083% IN NEBU
5.0000 mg | INHALATION_SOLUTION | Freq: Once | RESPIRATORY_TRACT | Status: AC
  Administered 2017-12-04: 5 mg via RESPIRATORY_TRACT
  Filled 2017-12-04: qty 6

## 2017-12-04 MED ORDER — DEXAMETHASONE SODIUM PHOSPHATE 10 MG/ML IJ SOLN
10.0000 mg | Freq: Once | INTRAMUSCULAR | Status: AC
  Administered 2017-12-04: 10 mg via INTRAVENOUS
  Filled 2017-12-04: qty 1

## 2017-12-04 NOTE — ED Notes (Signed)
Upon grabbing food for patient and family member states I'm leaving.  Removed his own IV and applied a dressing.  Left ama at this time.  did not wait to sign ama form.

## 2017-12-04 NOTE — ED Triage Notes (Signed)
Pt has been having sob x2 weeks with productive cough. Reports increased white phlegm. Hx of COPD, has been using his inhaler with little improvement.

## 2017-12-04 NOTE — ED Notes (Signed)
Patient transported to X-ray 

## 2017-12-04 NOTE — ED Provider Notes (Signed)
[facility] EMERGENCY DEPARTMENT Provider Note   CSN: [id number] Arrival date & time: 12/04/17  0038     History   Chief Complaint Chief Complaint  Patient presents with  . Shortness of Breath  . Chest Pain    HPI Allen Mayer is a 57 y.o. male.   57 y/o male with hx of CAD, COPD, HTN, tobacco use presents to the ED for c/o SOB. Reports SOB over the past 2 weeks in triage; during my encounter states it has really been closer to 5 weeks. Has been experiencing a cough productive of white phlegm after forceful coughing. Cough will sometimes result in posttussive emesis. Has been using his home albuterol for symptoms without significant relief. Notes some extremity muscle aches with generalized abdominal discomfort and distension as well. Feels like he cannot fully expand his lungs as a result of distension. Had 2 normal BMs today. Continues to pass flatus. No hx of fevers, dysuria, melena, hematochezia or hx of abdominal surgeries.     Past Medical History:  Diagnosis Date  . Acid reflux   . Arthritis   . Asthma   . Back pain   . Bronchitis   . COPD (chronic obstructive pulmonary disease) (HCC)   . Coronary artery disease   . Hypertension   . Irregular heart beat   . Neuropathic pain     Patient Active Problem List   Diagnosis Date Noted  . 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 Tobacco abuse 11/19/2014  . Essential hypertension, benign 11/19/2014  . Neuropathic pain     History reviewed. No pertinent surgical history.      Home Medications    Prior to Admission medications   Medication Sig Start Date End Date Taking? Authorizing Provider  albuterol (PROVENTIL HFA;VENTOLIN HFA) 108 (90 Base) MCG/ACT inhaler Inhale 2 puffs into the lungs every 4 (four) hours as needed for wheezing or shortness of breath. 06/22/17  Yes Pisciotta, Joni Reining, PA-C  nitroGLYCERIN (NITROSTAT) 0.4 MG SL tablet Place  0.4 mg under the tongue every 5 (five) minutes as needed for chest pain.   Yes [provider]  beclomethasone (QVAR) 80 MCG/ACT inhaler Inhale 1 puff into the lungs 2 (two) times daily as needed. Patient not taking: Reported on 12/04/2017 06/22/17   Pisciotta, Joni Reining, PA-C  benztropine (COGENTIN) 1 MG tablet Take 1 tablet (1 mg total) by mouth at bedtime. Patient not taking: Reported on 12/04/2017 06/22/17   Pisciotta, Joni Reining, PA-C  citalopram (CELEXA) 20 MG tablet Take 1 tablet (20 mg total) by mouth daily. Patient not taking: Reported on 12/04/2017 06/22/17   Pisciotta, Joni Reining, PA-C  esomeprazole (NEXIUM) 40 MG capsule Take 1 capsule (40 mg total) by mouth daily at 12 noon. Patient not taking: Reported on 12/04/2017 06/22/17   Pisciotta, Joni Reining, PA-C  gabapentin (NEURONTIN) 800 MG tablet Take 1 tablet (800 mg total) by mouth at bedtime. Patient not taking: Reported on 12/04/2017 07/11/15   Antony Madura, PA-C  lisinopril-hydrochlorothiazide (PRINZIDE,ZESTORETIC) 20-25 MG tablet Take 1 tablet by mouth daily. Patient not taking: Reported on 12/04/2017 06/22/17   Pisciotta, Joni Reining, PA-C  paliperidone (INVEGA) 6 MG 24 hr tablet Take 1 tablet (6 mg total) by mouth daily. Patient not taking: Reported on 12/04/2017 06/22/17   Pisciotta, Joni Reining, PA-C    Family History No family history on file.  Social History Social History   Tobacco Use  . Smoking status: Current Every Day Smoker    Packs/day: 0.50  .  Smokeless tobacco: Never Used  Substance Use Topics  . Alcohol use: No  . Drug use: No    Types: Cocaine, Marijuana     Allergies   Patient has no known allergies.   Review of Systems Review of Systems Ten systems reviewed and are negative for acute change, except as noted in the HPI.    Physical Exam Updated Vital Signs BP (!) 144/83   Pulse 96   Temp 98.5 F (36.9 C) (Oral)   Resp (!) 26   Ht 6' (1.829 m)   Wt 104.3 kg   SpO2 94%   BMI 31.19 kg/m   Physical Exam    Constitutional: He is oriented to person, place, and time. He appears well-developed and well-nourished. No distress.  Nontoxic appearing and in no distress  HENT:  Head: Normocephalic and atraumatic.  Eyes: Conjunctivae and EOM are normal. No scleral icterus.  Neck: Normal range of motion.  Cardiovascular: Normal rate, regular rhythm and intact distal pulses.  Pulmonary/Chest: Effort normal. No stridor. No respiratory distress. He has wheezes (with forceful expiration; all lung fields). He has no rales.  No nasal flaring, grunting, retractions  Abdominal: He exhibits distension. He exhibits no mass. There is no guarding.  No active bowel sounds in all quadrants.  Soft, distended abdomen. No focal TTP. No peritoneal signs.  Musculoskeletal: Normal range of motion.  Neurological: He is alert and oriented to person, place, and time. He exhibits normal muscle tone. Coordination normal.  Skin: Skin is warm and dry. No rash noted. He is not diaphoretic. No erythema. No pallor.  Psychiatric: He has a normal mood and affect. His behavior is normal.  Nursing note and vitals reviewed.    ED Treatments / Results  Labs (all labs ordered are listed, but only abnormal results are displayed) Labs Reviewed  COMPREHENSIVE METABOLIC PANEL - Abnormal; Notable for the following components:      Result Value   CO2 21 (*)    Glucose, Bld 111 (*)    Creatinine, Ser 1.27 (*)    Total Protein 6.1 (*)    Albumin 3.4 (*)    ALT 50 (*)    All other components within normal limits  URINALYSIS, ROUTINE W REFLEX MICROSCOPIC - Abnormal; Notable for the following components:   Color, Urine STRAW (*)    Hgb urine dipstick SMALL (*)    Leukocytes, UA MODERATE (*)    All other components within normal limits  CK - Abnormal; Notable for the following components:   Total CK 599 (*)    All other components within normal limits  CBC WITH DIFFERENTIAL/PLATELET  I-STAT TROPONIN, ED    EKG EKG  Interpretation  Date/Time:  Monday December 04 2017 00:45:22 EDT Ventricular Rate:  87 PR Interval:    QRS Duration: 86 QT Interval:  392 QTC Calculation: 472 R Axis:   -49 Text Interpretation:  Sinus rhythm Ventricular premature complex Probable left atrial enlargement Left anterior fascicular block LVH with secondary repolarization abnormality Anterior Q waves, possibly due to LVH No significant change was found Confirmed by Glynn Octave [phone]) on 12/04/2017 12:54:08 AM   Radiology Dg Chest 2 View  Result Date: 12/04/2017 CLINICAL DATA:  Chest pain all over.  Shortness of breath EXAM: CHEST - 2 VIEW COMPARISON:  06/22/2017 FINDINGS: Peribronchial thickening. Heart is normal size. No confluent opacities or effusions. No acute bony abnormality. IMPRESSION: Bronchitic changes. Electronically Signed   By: Charlett Nose M.D.   On: 12/04/2017 01:19  Procedures Procedures (including critical care time)  Medications Ordered in ED Medications  sodium chloride 0.9 % bolus 1,000 mL (0 mLs Intravenous Stopped 12/04/17 0233)  dexamethasone (DECADRON) injection 10 mg (10 mg Intravenous Given 12/04/17 0133)  albuterol (PROVENTIL) (2.5 MG/3ML) 0.083% nebulizer solution 5 mg (5 mg Nebulization Given 12/04/17 0135)  ipratropium (ATROVENT) nebulizer solution 0.5 mg (0.5 mg Nebulization Given 12/04/17 0135)    3:57 AM Patient seen ambulating out of the department.  Attempted to stop the patient from leaving with my attending, Dr. Manus Gunning.  Patient states that he is leaving because he needs to sleep before court in the morning.  States that everything is "taking too long".  Again, encouraged the patient to remain in the department for delta troponin which would be drawn now; initial test obtained at 0108.  Patient declined to stay for pending work up.     Initial Impression / Assessment and Plan / ED Course  I have reviewed the triage vital signs and the nursing notes.  Pertinent labs & imaging  results that were available during my care of the patient were reviewed by me and considered in my medical decision making (see chart for details).     57 year old male with a history of COPD presents to the emergency department for shortness of breath with persistent cough productive of clear phlegm.  He is afebrile, hemodynamically stable.  Initially wheezing which improved following DuoNeb treatment.  Also given Decadron for presumed COPD exacerbation.  Patient was awaiting delta troponin when he was seen ambulating from the department.  Attempted to encourage the patient to stay for remainder of work-up.  Declines, stating that he needs to go home and sleep before court.  Informed patient that discharge would be AGAINST MEDICAL ADVICE.  He verbalized understanding.  Has a capacity to make this decision.  Was seen department in the department in stable condition without signs of respiratory distress.   Final Clinical Impressions(s) / ED Diagnoses   Final diagnoses:  COPD exacerbation [facility])    ED Discharge Orders    None       Antony Madura, PA-C 12/04/17 0524    Glynn Octave, MD 12/04/17 [phone]

## 2017-12-11 ENCOUNTER — Other Ambulatory Visit: Payer: Self-pay

## 2017-12-11 ENCOUNTER — Inpatient Hospital Stay (HOSPITAL_COMMUNITY): Payer: Medicaid Other

## 2017-12-11 ENCOUNTER — Inpatient Hospital Stay (HOSPITAL_COMMUNITY)
Admission: EM | Admit: 2017-12-11 | Discharge: 2017-12-13 | DRG: 287 | Discharge status: Against Medical Advice | Payer: Medicaid Other | Attending: Internal Medicine | Admitting: Internal Medicine

## 2017-12-11 ENCOUNTER — Emergency Department (HOSPITAL_COMMUNITY): Payer: Medicaid Other

## 2017-12-11 ENCOUNTER — Encounter (HOSPITAL_COMMUNITY): Payer: Self-pay

## 2017-12-11 DIAGNOSIS — Z720 Tobacco use: Secondary | ICD-10-CM | POA: Diagnosis present

## 2017-12-11 DIAGNOSIS — R0600 Dyspnea, unspecified: Secondary | ICD-10-CM

## 2017-12-11 DIAGNOSIS — F17210 Nicotine dependence, cigarettes, uncomplicated: Secondary | ICD-10-CM | POA: Diagnosis present

## 2017-12-11 DIAGNOSIS — T380X5A Adverse effect of glucocorticoids and synthetic analogues, initial encounter: Secondary | ICD-10-CM | POA: Diagnosis not present

## 2017-12-11 DIAGNOSIS — F259 Schizoaffective disorder, unspecified: Secondary | ICD-10-CM | POA: Diagnosis present

## 2017-12-11 DIAGNOSIS — R0602 Shortness of breath: Secondary | ICD-10-CM | POA: Diagnosis present

## 2017-12-11 DIAGNOSIS — K219 Gastro-esophageal reflux disease without esophagitis: Secondary | ICD-10-CM | POA: Diagnosis present

## 2017-12-11 DIAGNOSIS — R748 Abnormal levels of other serum enzymes: Secondary | ICD-10-CM

## 2017-12-11 DIAGNOSIS — I5041 Acute combined systolic (congestive) and diastolic (congestive) heart failure: Secondary | ICD-10-CM | POA: Diagnosis present

## 2017-12-11 DIAGNOSIS — Z7982 Long term (current) use of aspirin: Secondary | ICD-10-CM | POA: Diagnosis not present

## 2017-12-11 DIAGNOSIS — R188 Other ascites: Secondary | ICD-10-CM | POA: Diagnosis present

## 2017-12-11 DIAGNOSIS — J441 Chronic obstructive pulmonary disease with (acute) exacerbation: Secondary | ICD-10-CM | POA: Diagnosis present

## 2017-12-11 DIAGNOSIS — I10 Essential (primary) hypertension: Secondary | ICD-10-CM

## 2017-12-11 DIAGNOSIS — R778 Other specified abnormalities of plasma proteins: Secondary | ICD-10-CM | POA: Diagnosis present

## 2017-12-11 DIAGNOSIS — I5021 Acute systolic (congestive) heart failure: Secondary | ICD-10-CM | POA: Diagnosis not present

## 2017-12-11 DIAGNOSIS — I700 Atherosclerosis of aorta: Secondary | ICD-10-CM | POA: Diagnosis present

## 2017-12-11 DIAGNOSIS — I2510 Atherosclerotic heart disease of native coronary artery without angina pectoris: Secondary | ICD-10-CM

## 2017-12-11 DIAGNOSIS — N179 Acute kidney failure, unspecified: Secondary | ICD-10-CM | POA: Diagnosis present

## 2017-12-11 DIAGNOSIS — Z79899 Other long term (current) drug therapy: Secondary | ICD-10-CM

## 2017-12-11 DIAGNOSIS — D72829 Elevated white blood cell count, unspecified: Secondary | ICD-10-CM | POA: Diagnosis not present

## 2017-12-11 DIAGNOSIS — F1410 Cocaine abuse, uncomplicated: Secondary | ICD-10-CM | POA: Diagnosis present

## 2017-12-11 DIAGNOSIS — I272 Pulmonary hypertension, unspecified: Secondary | ICD-10-CM | POA: Diagnosis present

## 2017-12-11 DIAGNOSIS — Z8249 Family history of ischemic heart disease and other diseases of the circulatory system: Secondary | ICD-10-CM | POA: Diagnosis not present

## 2017-12-11 DIAGNOSIS — I420 Dilated cardiomyopathy: Secondary | ICD-10-CM

## 2017-12-11 DIAGNOSIS — I5022 Chronic systolic (congestive) heart failure: Secondary | ICD-10-CM | POA: Diagnosis not present

## 2017-12-11 DIAGNOSIS — I110 Hypertensive heart disease with heart failure: Secondary | ICD-10-CM | POA: Diagnosis present

## 2017-12-11 DIAGNOSIS — I509 Heart failure, unspecified: Secondary | ICD-10-CM

## 2017-12-11 DIAGNOSIS — R7989 Other specified abnormal findings of blood chemistry: Secondary | ICD-10-CM

## 2017-12-11 DIAGNOSIS — F1010 Alcohol abuse, uncomplicated: Secondary | ICD-10-CM | POA: Diagnosis not present

## 2017-12-11 DIAGNOSIS — J449 Chronic obstructive pulmonary disease, unspecified: Secondary | ICD-10-CM | POA: Diagnosis present

## 2017-12-11 DIAGNOSIS — J431 Panlobular emphysema: Secondary | ICD-10-CM | POA: Diagnosis not present

## 2017-12-11 HISTORY — DX: Schizoaffective disorder, bipolar type: F25.0

## 2017-12-11 HISTORY — DX: Schizoaffective disorder, unspecified: F25.9

## 2017-12-11 LAB — CBC WITH DIFFERENTIAL/PLATELET
BASOS PCT: 1 %
Basophils Absolute: 0 10*3/uL (ref 0.0–0.1)
EOS ABS: 0.2 10*3/uL (ref 0.0–0.7)
Eosinophils Relative: 3 %
HCT: 47.4 % (ref 39.0–52.0)
HEMOGLOBIN: 15.9 g/dL (ref 13.0–17.0)
Lymphocytes Relative: 27 %
Lymphs Abs: 1.8 10*3/uL (ref 0.7–4.0)
MCH: 30.6 pg (ref 26.0–34.0)
MCHC: 33.5 g/dL (ref 30.0–36.0)
MCV: 91.3 fL (ref 78.0–100.0)
Monocytes Absolute: 0.6 10*3/uL (ref 0.1–1.0)
Monocytes Relative: 10 %
NEUTROS PCT: 59 %
Neutro Abs: 4 10*3/uL (ref 1.7–7.7)
Platelets: 231 10*3/uL (ref 150–400)
RBC: 5.19 MIL/uL (ref 4.22–5.81)
RDW: 14.4 % (ref 11.5–15.5)
WBC: 6.6 10*3/uL (ref 4.0–10.5)

## 2017-12-11 LAB — ECHOCARDIOGRAM COMPLETE: Weight: 3679.04 oz

## 2017-12-11 LAB — RAPID URINE DRUG SCREEN, HOSP PERFORMED
Amphetamines: NOT DETECTED
Barbiturates: NOT DETECTED
Benzodiazepines: NOT DETECTED
Cocaine: POSITIVE — AB
Opiates: NOT DETECTED
Tetrahydrocannabinol: NOT DETECTED

## 2017-12-11 LAB — COMPREHENSIVE METABOLIC PANEL
ALT: 128 U/L — ABNORMAL HIGH (ref 0–44)
ANION GAP: 12 (ref 5–15)
AST: 80 U/L — ABNORMAL HIGH (ref 15–41)
Albumin: 3.8 g/dL (ref 3.5–5.0)
Alkaline Phosphatase: 56 U/L (ref 38–126)
BILIRUBIN TOTAL: 1 mg/dL (ref 0.3–1.2)
BUN: 16 mg/dL (ref 6–20)
CALCIUM: 8.9 mg/dL (ref 8.9–10.3)
CO2: 22 mmol/L (ref 22–32)
CREATININE: 1.45 mg/dL — AB (ref 0.61–1.24)
Chloride: 105 mmol/L (ref 98–111)
GFR calc Af Amer: 60 mL/min — ABNORMAL LOW (ref >60)
GFR, EST NON AFRICAN AMERICAN: 52 mL/min — AB (ref >60)
Glucose, Bld: 118 mg/dL — ABNORMAL HIGH (ref 70–99)
Potassium: 4.5 mmol/L (ref 3.5–5.1)
Sodium: 139 mmol/L (ref 135–145)
TOTAL PROTEIN: 6.6 g/dL (ref 6.5–8.1)

## 2017-12-11 LAB — URINALYSIS, ROUTINE W REFLEX MICROSCOPIC
BILIRUBIN URINE: NEGATIVE
Bacteria, UA: NONE SEEN
Glucose, UA: NEGATIVE mg/dL
KETONES UR: NEGATIVE mg/dL
NITRITE: NEGATIVE
PROTEIN: 30 mg/dL — AB
SPECIFIC GRAVITY, URINE: 1.011 (ref 1.005–1.030)
pH: 6 (ref 5.0–8.0)

## 2017-12-11 LAB — TROPONIN I
TROPONIN I: 0.06 ng/mL — AB (ref <0.03)
Troponin I: 0.04 ng/mL (ref <0.03)
Troponin I: 0.04 ng/mL (ref <0.03)

## 2017-12-11 LAB — LIPASE, BLOOD: LIPASE: 34 U/L (ref 11–51)

## 2017-12-11 LAB — BRAIN NATRIURETIC PEPTIDE: B NATRIURETIC PEPTIDE 5: 863.3 pg/mL — AB (ref 0.0–100.0)

## 2017-12-11 MED ORDER — HEPARIN SODIUM (PORCINE) 5000 UNIT/ML IJ SOLN
5000.0000 [IU] | Freq: Three times a day (TID) | INTRAMUSCULAR | Status: DC
  Administered 2017-12-11 – 2017-12-12 (×5): 5000 [IU] via SUBCUTANEOUS
  Filled 2017-12-11 (×5): qty 1

## 2017-12-11 MED ORDER — BUDESONIDE 0.25 MG/2ML IN SUSP
0.2500 mg | Freq: Two times a day (BID) | RESPIRATORY_TRACT | Status: DC
  Administered 2017-12-11 – 2017-12-13 (×4): 0.25 mg via RESPIRATORY_TRACT
  Filled 2017-12-11 (×5): qty 2

## 2017-12-11 MED ORDER — IOPAMIDOL (ISOVUE-370) INJECTION 76%
INTRAVENOUS | Status: AC
  Filled 2017-12-11: qty 100

## 2017-12-11 MED ORDER — FUROSEMIDE 10 MG/ML IJ SOLN
40.0000 mg | Freq: Once | INTRAMUSCULAR | Status: AC
  Administered 2017-12-11: 40 mg via INTRAVENOUS
  Filled 2017-12-11: qty 4

## 2017-12-11 MED ORDER — IPRATROPIUM-ALBUTEROL 0.5-2.5 (3) MG/3ML IN SOLN
3.0000 mL | Freq: Once | RESPIRATORY_TRACT | Status: AC
  Administered 2017-12-11: 3 mL via RESPIRATORY_TRACT
  Filled 2017-12-11: qty 3

## 2017-12-11 MED ORDER — ONDANSETRON HCL 4 MG/2ML IJ SOLN: 4.0000 mg | Freq: Four times a day (QID) | INTRAMUSCULAR | Status: DC | PRN

## 2017-12-11 MED ORDER — DICYCLOMINE HCL 10 MG PO CAPS
10.0000 mg | ORAL_CAPSULE | Freq: Once | ORAL | Status: AC
  Administered 2017-12-11: 10 mg via ORAL
  Filled 2017-12-11: qty 1

## 2017-12-11 MED ORDER — LEVALBUTEROL HCL 0.63 MG/3ML IN NEBU
0.6300 mg | INHALATION_SOLUTION | Freq: Four times a day (QID) | RESPIRATORY_TRACT | Status: DC
  Administered 2017-12-11: 0.63 mg via RESPIRATORY_TRACT
  Filled 2017-12-11: qty 3

## 2017-12-11 MED ORDER — LEVALBUTEROL HCL 0.63 MG/3ML IN NEBU: 0.6300 mg | INHALATION_SOLUTION | RESPIRATORY_TRACT | Status: DC | PRN

## 2017-12-11 MED ORDER — PANTOPRAZOLE SODIUM 40 MG PO TBEC
40.0000 mg | DELAYED_RELEASE_TABLET | Freq: Every day | ORAL | Status: DC
  Administered 2017-12-11 – 2017-12-13 (×3): 40 mg via ORAL
  Filled 2017-12-11 (×3): qty 1

## 2017-12-11 MED ORDER — ACETAMINOPHEN 325 MG PO TABS
650.0000 mg | ORAL_TABLET | ORAL | Status: DC | PRN
  Administered 2017-12-11: 650 mg via ORAL
  Filled 2017-12-11: qty 2

## 2017-12-11 MED ORDER — IOPAMIDOL (ISOVUE-370) INJECTION 76%
100.0000 mL | Freq: Once | INTRAVENOUS | Status: AC | PRN
  Administered 2017-12-11: 100 mL via INTRAVENOUS

## 2017-12-11 MED ORDER — BECLOMETHASONE DIPROPIONATE 80 MCG/ACT IN AERS: 1.0000 | INHALATION_SPRAY | Freq: Two times a day (BID) | RESPIRATORY_TRACT | Status: DC

## 2017-12-11 MED ORDER — HYDRALAZINE HCL 20 MG/ML IJ SOLN: 10.0000 mg | Freq: Four times a day (QID) | INTRAMUSCULAR | Status: DC | PRN

## 2017-12-11 MED ORDER — ASPIRIN EC 81 MG PO TBEC
81.0000 mg | DELAYED_RELEASE_TABLET | Freq: Every day | ORAL | Status: DC
  Administered 2017-12-11 – 2017-12-13 (×3): 81 mg via ORAL
  Filled 2017-12-11 (×3): qty 1

## 2017-12-11 MED ORDER — SODIUM CHLORIDE 0.9 % IV SOLN: 250.0000 mL | INTRAVENOUS | Status: DC | PRN

## 2017-12-11 MED ORDER — SODIUM CHLORIDE 0.9% FLUSH: 3.0000 mL | INTRAVENOUS | Status: DC | PRN

## 2017-12-11 MED ORDER — NITROGLYCERIN 0.4 MG SL SUBL
0.4000 mg | SUBLINGUAL_TABLET | SUBLINGUAL | Status: DC | PRN
  Administered 2017-12-11: 0.4 mg via SUBLINGUAL
  Filled 2017-12-11: qty 1

## 2017-12-11 MED ORDER — SODIUM CHLORIDE 0.9% FLUSH
3.0000 mL | Freq: Two times a day (BID) | INTRAVENOUS | Status: DC
  Administered 2017-12-11 – 2017-12-12 (×2): 3 mL via INTRAVENOUS

## 2017-12-11 MED ORDER — DOXYCYCLINE HYCLATE 100 MG PO TABS
100.0000 mg | ORAL_TABLET | Freq: Two times a day (BID) | ORAL | Status: DC
  Administered 2017-12-11 – 2017-12-13 (×4): 100 mg via ORAL
  Filled 2017-12-11 (×4): qty 1

## 2017-12-11 MED ORDER — IPRATROPIUM BROMIDE 0.02 % IN SOLN
0.5000 mg | Freq: Four times a day (QID) | RESPIRATORY_TRACT | Status: DC
  Administered 2017-12-11: 0.5 mg via RESPIRATORY_TRACT
  Filled 2017-12-11: qty 2.5

## 2017-12-11 MED ORDER — FUROSEMIDE 10 MG/ML IJ SOLN
40.0000 mg | Freq: Two times a day (BID) | INTRAMUSCULAR | Status: DC
  Administered 2017-12-11 – 2017-12-12 (×2): 40 mg via INTRAVENOUS
  Filled 2017-12-11 (×3): qty 4

## 2017-12-11 MED ORDER — PREDNISONE 20 MG PO TABS
40.0000 mg | ORAL_TABLET | Freq: Every day | ORAL | Status: DC
  Administered 2017-12-11 – 2017-12-13 (×3): 40 mg via ORAL
  Filled 2017-12-11 (×3): qty 2

## 2017-12-11 MED ORDER — PNEUMOCOCCAL VAC POLYVALENT 25 MCG/0.5ML IJ INJ
0.5000 mL | INJECTION | INTRAMUSCULAR | Status: DC
  Filled 2017-12-11: qty 0.5

## 2017-12-11 MED ORDER — GABAPENTIN 400 MG PO CAPS
400.0000 mg | ORAL_CAPSULE | Freq: Every day | ORAL | Status: DC
  Administered 2017-12-11 – 2017-12-12 (×2): 400 mg via ORAL
  Filled 2017-12-11 (×2): qty 1

## 2017-12-11 MED ORDER — ALBUTEROL SULFATE (2.5 MG/3ML) 0.083% IN NEBU
5.0000 mg | INHALATION_SOLUTION | Freq: Once | RESPIRATORY_TRACT | Status: AC
  Administered 2017-12-11: 5 mg via RESPIRATORY_TRACT
  Filled 2017-12-11: qty 6

## 2017-12-11 MED ORDER — LEVALBUTEROL HCL 0.63 MG/3ML IN NEBU
0.6300 mg | INHALATION_SOLUTION | Freq: Two times a day (BID) | RESPIRATORY_TRACT | Status: DC
  Administered 2017-12-12 – 2017-12-13 (×3): 0.63 mg via RESPIRATORY_TRACT
  Filled 2017-12-11 (×4): qty 3

## 2017-12-11 MED ORDER — SIMETHICONE 80 MG PO CHEW
160.0000 mg | CHEWABLE_TABLET | Freq: Once | ORAL | Status: AC | PRN
  Administered 2017-12-13: 160 mg via ORAL
  Filled 2017-12-11: qty 2

## 2017-12-11 MED ORDER — HYDRALAZINE HCL 10 MG PO TABS
10.0000 mg | ORAL_TABLET | Freq: Three times a day (TID) | ORAL | Status: DC
  Administered 2017-12-11 – 2017-12-13 (×7): 10 mg via ORAL
  Filled 2017-12-11 (×7): qty 1

## 2017-12-11 MED ORDER — ENSURE ENLIVE PO LIQD
237.0000 mL | Freq: Two times a day (BID) | ORAL | Status: DC
  Administered 2017-12-11 – 2017-12-12 (×3): 237 mL via ORAL

## 2017-12-11 MED ORDER — MORPHINE SULFATE (PF) 2 MG/ML IV SOLN
1.0000 mg | INTRAVENOUS | Status: DC | PRN
  Administered 2017-12-11 – 2017-12-12 (×3): 1 mg via INTRAVENOUS
  Filled 2017-12-11 (×3): qty 1

## 2017-12-11 MED ORDER — METHYLPREDNISOLONE SODIUM SUCC 125 MG IJ SOLR
125.0000 mg | Freq: Once | INTRAMUSCULAR | Status: AC
  Administered 2017-12-11: 125 mg via INTRAVENOUS
  Filled 2017-12-11: qty 2

## 2017-12-11 MED ORDER — IPRATROPIUM BROMIDE 0.02 % IN SOLN
0.5000 mg | Freq: Two times a day (BID) | RESPIRATORY_TRACT | Status: DC
  Administered 2017-12-12 – 2017-12-13 (×3): 0.5 mg via RESPIRATORY_TRACT
  Filled 2017-12-11 (×4): qty 2.5

## 2017-12-11 MED ORDER — NICOTINE 21 MG/24HR TD PT24
21.0000 mg | MEDICATED_PATCH | Freq: Every day | TRANSDERMAL | Status: DC
  Filled 2017-12-11 (×2): qty 1

## 2017-12-11 NOTE — ED Notes (Signed)
Report given to kamna,RN 

## 2017-12-11 NOTE — ED Notes (Signed)
Patient ambulated to restroom with no problems and no assist. Wife at bedside.

## 2017-12-11 NOTE — Consult Note (Signed)
Cardiology Consultation:   Patient ID: Allen Mayer; [id number]; 10-09-1960   Admit date: 12/11/2017 Date of Consult: 12/11/2017  Primary Care Provider: [facility], [facility] Primary Cardiologist: No primary care provider on file.  New Primary Electrophysiologist:     Patient Profile:   Allen Mayer is a 57 y.o. male with a hx of palpitations but no other cardiac history other than coronary calcium noted on previous chest imaging who is being seen today for the evaluation of SOB at the request of Dr. Isidoro Donning.  History of Present Illness:   Mr. Gunn presented with SOB.  He reports that he frequently has dyspnea and stays a little SOB.  He sounds like he is very sedentary.  He reports that he had an acute exacerbation of his dyspnea over the last one to two days.  He has had increasing cough productive of white thin to thick sputum.  He has had no new PND or orthopnea although he sleeps propped up because he has GERD.  He denies any chest pain, neck or arm pain.  He has had no fevers or chills although he "stays hot."  He has had abdominal distension but no weight gain or edema.   He came to the ED because of the SOB.  He has presented before because of this and has been treated for COPD.   In the ED he did have an elevated BNP level.  Trop was mildly elevated at 0.06.  Creat is increased.  Cocaine was positive.    CT of his chest did suggest pulmonary edema.  He had coronary calcium noted and evidence of aortic atherosclerosis although there was no clear evidence of pulmonary embolism.   CXR also suggested edema with small effusion.  He was treated with IV Lasix, steroids and bronchodilators.     Past Medical History:  Diagnosis Date  . Acid reflux   . Arthritis   . Asthma   . Back pain   . Bronchitis   . COPD (chronic obstructive pulmonary disease) (HCC)   . Hypertension   . Irregular heart beat   . Neuropathic pain     Past Surgical History:  Procedure  Laterality Date  . None         Allergies:   No Known Allergies   Prior to Admission medications   Medication Sig Start Date End Date Taking? Authorizing Provider  albuterol (PROVENTIL HFA;VENTOLIN HFA) 108 (90 Base) MCG/ACT inhaler Inhale 2 puffs into the lungs every 4 (four) hours as needed for wheezing or shortness of breath. 06/22/17  Yes Pisciotta, Mardella Layman  aspirin EC 81 MG tablet Take 81 mg by mouth daily.   Yes [provider]  gabapentin (NEURONTIN) 400 MG capsule Take 400 mg by mouth at bedtime.   Yes [provider]  lisinopril-hydrochlorothiazide (PRINZIDE,ZESTORETIC) 20-25 MG tablet Take 1 tablet by mouth daily. 06/22/17  Yes Pisciotta, Joni Reining, PA-C  nitroGLYCERIN (NITROSTAT) 0.4 MG SL tablet Place 0.4 mg under the tongue every 5 (five) minutes as needed for chest pain.   Yes [provider]  pantoprazole (PROTONIX) 20 MG tablet Take 20 mg by mouth daily.   Yes [provider]  beclomethasone (QVAR) 80 MCG/ACT inhaler Inhale 1 puff into the lungs 2 (two) times daily as needed. 06/22/17   Pisciotta, Joni Reining, PA-C  benztropine (COGENTIN) 1 MG tablet Take 1 tablet (1 mg total) by mouth at bedtime. 06/22/17   Pisciotta, Joni Reining, PA-C  citalopram (CELEXA) 20 MG tablet Take 1  tablet (20 mg total) by mouth daily. 06/22/17   Pisciotta, Joni Reining, PA-C  paliperidone (INVEGA) 6 MG 24 hr tablet Take 1 tablet (6 mg total) by mouth daily. 06/22/17   Pisciotta, Joni Reining, PA-C    Social History:   Social History   Socioeconomic History  . Marital status: Married    Spouse name: Not on file  . Number of children: Not on file  . Years of education: Not on file  . Highest education level: Not on file  Occupational History  . Not on file  Social Needs  . Financial resource strain: Not on file  . Food insecurity:    Worry: Not on file    Inability: Not on file  . Transportation needs:    Medical: Not on file    Non-medical: Not on file  Tobacco Use  .  Smoking status: Current Every Day Smoker    Packs/day: 0.50  . Smokeless tobacco: Never Used  Substance and Sexual Activity  . Alcohol use: Yes    Comment: occas  . Drug use: No    Types: Cocaine, Marijuana  . Sexual activity: Not on file  Lifestyle  . Physical activity:    Days per week: Not on file    Minutes per session: Not on file  . Stress: Not on file  Relationships  . Social connections:    Talks on phone: Not on file    Gets together: Not on file    Attends religious service: Not on file    Active member of club or organization: Not on file    Attends meetings of clubs or organizations: Not on file    Relationship status: Not on file  . Intimate partner violence:    Fear of current or ex partner: Not on file    Emotionally abused: Not on file    Physically abused: Not on file    Forced sexual activity: Not on file  Other Topics Concern  . Not on file  Social History Narrative   Lives with fiance.      Family History:    Family History  Problem Relation Age of Onset  . Heart attack Mother        Died age 20  . Heart attack Brother        40     ROS:  Please see the history of present illness.  Review of Systems  Eyes: Positive for blurred vision.    As stated in the HPI and negative for all other systems.  Physical Exam/Data:   Vitals:   12/11/17 1221 12/11/17 1229 12/11/17 1230 12/11/17 1411  BP: (!) 173/103  (!) 154/98 (!) 160/92  Pulse: 97 (!) 101  87  Resp: (!) 22 (!) 22 15 18   Temp:    98.7 F (37.1 C)  TempSrc:    Oral  SpO2: 96% 95% 97% 96%  Weight:        Intake/Output Summary (Last 24 hours) at 12/11/2017 1434 Last data filed at 12/11/2017 1354 Gross per 24 hour  Intake -  Output 1100 ml  Net -1100 ml   Filed Weights   12/11/17 0855  Weight: 104.3 kg   Body mass index is 31.19 kg/m.  GENERAL:  No distress HEENT:   Pupils equal round and reactive, fundi not visualized, oral mucosa unremarkable NECK:  No  jugular venous  distention, waveform within normal limits, carotid upstroke brisk and symmetric, no bruits, no thyromegaly LYMPHATICS:  No cervical, inguinal adenopathy LUNGS:  Basilar fine crackles, no wheezing.  BACK:  No CVA tenderness CHEST:   Unremarkable HEART:  PMI not displaced or sustained,S1 and S2 within normal limits, no S3, positive S4, no clicks, no rubs, no murmurs ABD:  Flat, positive bowel sounds normal in frequency in pitch, no bruits, no rebound, no guarding, no midline pulsatile mass, no hepatomegaly, no splenomegaly EXT:  2 plus pulses throughout, no  edema, no cyanosis no clubbing SKIN:  No rashes no nodules NEURO:   Cranial nerves II through XII grossly intact, motor grossly intact throughout PSYCH:    Cognitively intact, oriented to person place and time   EKG:  The EKG was personally reviewed and demonstrates:  NSR, rate 87, QT prolonged.  LVH by voltage.  Diffuse inferior and lateral T wave inversion consistent with repolarization. Could not exclude ischemia.   T wave inversion is new since 2017 but was evident on EKG earlier this year.   Telemetry:  NA  Relevant CV Studies: NA  Laboratory Data:  Chemistry Recent Labs  Lab 12/11/17 0914  NA 139  K 4.5  CL 105  CO2 22  GLUCOSE 118*  BUN 16  CREATININE 1.45*  CALCIUM 8.9  GFRNONAA 52*  GFRAA 60*  ANIONGAP 12    Recent Labs  Lab 12/11/17 0914  PROT 6.6  ALBUMIN 3.8  AST 80*  ALT 128*  ALKPHOS 56  BILITOT 1.0   Hematology Recent Labs  Lab 12/11/17 0914  WBC 6.6  RBC 5.19  HGB 15.9  HCT 47.4  MCV 91.3  MCH 30.6  MCHC 33.5  RDW 14.4  PLT 231   Cardiac Enzymes Recent Labs  Lab 12/11/17 0914  TROPONINI 0.06*   No results for input(s): TROPIPOC in the last 168 hours.  BNP Recent Labs  Lab 12/11/17 0914  BNP 863.3*    DDimer No results for input(s): DDIMER in the last 168 hours.  Radiology/Studies:  Dg Chest 2 View  Result Date: 12/11/2017 CLINICAL DATA:  57 year old male with shortness of  breath for the past month which is worse over the past 2 days accompanied by chest pain and productive cough EXAM: CHEST - 2 VIEW COMPARISON:  Prior chest x-ray 12/04/2017 FINDINGS: Borderline cardiomegaly. Increased pulmonary vascular congestion and diffuse interstitial prominence. Interval development of a small right-sided pleural effusion. Diffuse bronchial wall thickening and peribronchial cuffing. Nonspecific patchy airspace opacities in both lung bases have a linear appearance most suggestive of atelectasis. No acute osseous abnormality. IMPRESSION: 1. Interval development of a small right-sided pleural effusion and associated right basilar atelectasis. 2. Slightly increased pulmonary vascular congestion and diffuse interstitial prominence. Differential considerations include developing mild interstitial edema versus atypical or viral respiratory infection. 3. Stable borderline cardiomegaly. 4. Persistent advanced bronchitic changes consistent with the clinical history of COPD. Acute on chronic bronchitis is difficult to exclude radiographically. Electronically Signed   By: Malachy Moan M.D.   On: 12/11/2017 09:52   Ct Angio Chest Pe W And/or Wo Contrast  Result Date: 12/11/2017 CLINICAL DATA:  57 year old male with history of generalized abdominal pain. Mild fever. Shortness of breath for 1 month. Chest pain. EXAM: CT ANGIOGRAPHY CHEST CT ABDOMEN AND PELVIS WITH CONTRAST TECHNIQUE: Multidetector CT imaging of the chest was performed using the standard protocol during bolus administration of intravenous contrast. Multiplanar CT image reconstructions and MIPs were obtained to evaluate the vascular anatomy. Multidetector CT imaging of the abdomen and pelvis was performed using the standard protocol during bolus administration of intravenous contrast. CONTRAST:  ISOVUE-370 IOPAMIDOL (ISOVUE-370) INJECTION 76% COMPARISON:  CT the abdomen and pelvis 01/31/2012. No prior chest CT. FINDINGS: Comment:  Study is limited for assessment of pulmonary embolism due to excessive patient respiratory motion. CTA CHEST FINDINGS Cardiovascular: No central, lobar or proximal segmental sized filling defects to suggest pulmonary embolism. Larger segmental and subsegmental size filling defects cannot be entirely excluded secondary to excessive patient respiratory motion. Heart size is mildly enlarged. There is no significant pericardial fluid, thickening or pericardial calcification. There is aortic atherosclerosis, as well as atherosclerosis of the great vessels of the mediastinum and the coronary arteries, including calcified atherosclerotic plaque in the left anterior descending and left circumflex coronary arteries. Mediastinum/Nodes: No pathologically enlarged mediastinal or hilar lymph nodes. Esophagus is unremarkable in appearance. No axillary lymphadenopathy. Lungs/Pleura: Small bilateral pleural effusions lying dependently. Patchy areas of mild ground-glass attenuation and widespread mild interlobular septal thickening, suggesting a background of mild interstitial pulmonary edema. No confluent consolidative airspace disease. No suspicious appearing pulmonary nodules or masses are noted. Musculoskeletal: There are no aggressive appearing lytic or blastic lesions noted in the visualized portions of the skeleton. Review of the MIP images confirms the above findings. CT ABDOMEN and PELVIS FINDINGS Hepatobiliary: No suspicious cystic or solid hepatic lesions. No intra or extrahepatic biliary ductal dilatation. No calcified gallstones. Gallbladder wall appears edematous. Gallbladder does not appear overly distended. No pericholecystic fluid. Pancreas: No pancreatic mass. No pancreatic ductal dilatation. No pancreatic or peripancreatic fluid or inflammatory changes. Spleen: Unremarkable. Adrenals/Urinary Tract: Bilateral kidneys and bilateral adrenal glands are normal in appearance. No hydroureteronephrosis. Urinary bladder is  normal in appearance. Stomach/Bowel: Normal appearance of the stomach. No pathologic dilatation of small bowel or colon. Normal appendix. Vascular/Lymphatic: Aortic atherosclerosis, without evidence of aneurysm or dissection in the abdominal or pelvic vasculature. No lymphadenopathy noted in the abdomen or pelvis. Reproductive: Prostate gland and seminal vesicles are unremarkable in appearance. Other: Some fluid is noted tracking along the lower aspect of the right side of the retroperitoneum and along the right pelvic sidewall. There is also trace volume of ascites. No pneumoperitoneum. Musculoskeletal: There are no aggressive appearing lytic or blastic lesions noted in the visualized portions of the skeleton. Review of the MIP images confirms the above findings. IMPRESSION: 1. Limited study, which demonstrates no evidence of central, lobar or proximal segmental sized pulmonary embolism. Smaller distal segmental and subsegmental sized emboli cannot be entirely excluded secondary to respiratory motion. 2. Cardiomegaly with evidence of interstitial pulmonary edema and small bilateral pleural effusions suggesting congestive heart failure. 3. Small amount of ascites. Small volume of fluid tracking along the right retroperitoneum into the right side of the pelvis. Gallbladder wall edema. These findings are all favored to be related to underlying hepatic congestion in the setting of congestive heart failure. There are no calcified gallstones. Additionally, the gallbladder does not appear overly distended, and is not surrounded by overt inflammatory changes to suggest a cholecystitis at this time. 4. Aortic atherosclerosis, in addition to 2 vessel coronary artery disease. Please note that although the presence of coronary artery calcium documents the presence of coronary artery disease, the severity of this disease and any potential stenosis cannot be assessed on this non-gated CT examination. Assessment for potential  risk factor modification, dietary therapy or pharmacologic therapy may be warranted, if clinically indicated. Electronically Signed   By: Trudie Reed M.D.   On: 12/11/2017 12:00   Ct Abdomen Pelvis W Contrast  Result Date: 12/11/2017 CLINICAL DATA:  57 year old male with history  of generalized abdominal pain. Mild fever. Shortness of breath for 1 month. Chest pain. EXAM: CT ANGIOGRAPHY CHEST CT ABDOMEN AND PELVIS WITH CONTRAST TECHNIQUE: Multidetector CT imaging of the chest was performed using the standard protocol during bolus administration of intravenous contrast. Multiplanar CT image reconstructions and MIPs were obtained to evaluate the vascular anatomy. Multidetector CT imaging of the abdomen and pelvis was performed using the standard protocol during bolus administration of intravenous contrast. CONTRAST:  ISOVUE-370 IOPAMIDOL (ISOVUE-370) INJECTION 76% COMPARISON:  CT the abdomen and pelvis 01/31/2012. No prior chest CT. FINDINGS: Comment: Study is limited for assessment of pulmonary embolism due to excessive patient respiratory motion. CTA CHEST FINDINGS Cardiovascular: No central, lobar or proximal segmental sized filling defects to suggest pulmonary embolism. Larger segmental and subsegmental size filling defects cannot be entirely excluded secondary to excessive patient respiratory motion. Heart size is mildly enlarged. There is no significant pericardial fluid, thickening or pericardial calcification. There is aortic atherosclerosis, as well as atherosclerosis of the great vessels of the mediastinum and the coronary arteries, including calcified atherosclerotic plaque in the left anterior descending and left circumflex coronary arteries. Mediastinum/Nodes: No pathologically enlarged mediastinal or hilar lymph nodes. Esophagus is unremarkable in appearance. No axillary lymphadenopathy. Lungs/Pleura: Small bilateral pleural effusions lying dependently. Patchy areas of mild ground-glass  attenuation and widespread mild interlobular septal thickening, suggesting a background of mild interstitial pulmonary edema. No confluent consolidative airspace disease. No suspicious appearing pulmonary nodules or masses are noted. Musculoskeletal: There are no aggressive appearing lytic or blastic lesions noted in the visualized portions of the skeleton. Review of the MIP images confirms the above findings. CT ABDOMEN and PELVIS FINDINGS Hepatobiliary: No suspicious cystic or solid hepatic lesions. No intra or extrahepatic biliary ductal dilatation. No calcified gallstones. Gallbladder wall appears edematous. Gallbladder does not appear overly distended. No pericholecystic fluid. Pancreas: No pancreatic mass. No pancreatic ductal dilatation. No pancreatic or peripancreatic fluid or inflammatory changes. Spleen: Unremarkable. Adrenals/Urinary Tract: Bilateral kidneys and bilateral adrenal glands are normal in appearance. No hydroureteronephrosis. Urinary bladder is normal in appearance. Stomach/Bowel: Normal appearance of the stomach. No pathologic dilatation of small bowel or colon. Normal appendix. Vascular/Lymphatic: Aortic atherosclerosis, without evidence of aneurysm or dissection in the abdominal or pelvic vasculature. No lymphadenopathy noted in the abdomen or pelvis. Reproductive: Prostate gland and seminal vesicles are unremarkable in appearance. Other: Some fluid is noted tracking along the lower aspect of the right side of the retroperitoneum and along the right pelvic sidewall. There is also trace volume of ascites. No pneumoperitoneum. Musculoskeletal: There are no aggressive appearing lytic or blastic lesions noted in the visualized portions of the skeleton. Review of the MIP images confirms the above findings. IMPRESSION: 1. Limited study, which demonstrates no evidence of central, lobar or proximal segmental sized pulmonary embolism. Smaller distal segmental and subsegmental sized emboli cannot be  entirely excluded secondary to respiratory motion. 2. Cardiomegaly with evidence of interstitial pulmonary edema and small bilateral pleural effusions suggesting congestive heart failure. 3. Small amount of ascites. Small volume of fluid tracking along the right retroperitoneum into the right side of the pelvis. Gallbladder wall edema. These findings are all favored to be related to underlying hepatic congestion in the setting of congestive heart failure. There are no calcified gallstones. Additionally, the gallbladder does not appear overly distended, and is not surrounded by overt inflammatory changes to suggest a cholecystitis at this time. 4. Aortic atherosclerosis, in addition to 2 vessel coronary artery disease. Please note that although the presence  of coronary artery calcium documents the presence of coronary artery disease, the severity of this disease and any potential stenosis cannot be assessed on this non-gated CT examination. Assessment for potential risk factor modification, dietary therapy or pharmacologic therapy may be warranted, if clinically indicated. Electronically Signed   By: Trudie Reed M.D.   On: 12/11/2017 12:00    Assessment and Plan:   CORONARY CALCIUM:   I don't strongly suspect ACS.  Cycle enzymes.  Likely needs out patient stress testing given his coronary calcium but further decisions will be pending the enzyme trend.    ACUTE DYSPNEA:  I suspect that this is mixed with some element of pulmonary vascular congestion.  Likely diastolic dysfunction.  Echo pending.  Discussed low salt.  Needs BP control as below.  Agree with plans for diuresis.    AORTIC ATHEROSCLEROSIS:  Needs risk reduction.  Check lipids.  Stop smoking advised.     ELEVATED LIVER ENZYMES:  Per primary team   HTN:   He does not report this but his BP was quite elevated in the ED and is elevated to a lesser degree now.   Would resume out patient meds watching creat as it is mildly elevated.    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  I will defer to the primary team to clarify this and add to the problem list if verified.  SUBSTANCE ABUSE:  Needs education and to abstain.    For questions or updates, please contact [facility] Please consult [email or web address] for contact info under Cardiology/STEMI.   Signed, Rollene Rotunda, MD  12/11/2017 2:34 PM

## 2017-12-11 NOTE — ED Notes (Signed)
Lasix given as ordered. Dr Rosalia Hammers in and ok'ed him to have something to eat, sandwiches and graham crackers with soda provided

## 2017-12-11 NOTE — Progress Notes (Signed)
  Echocardiogram 2D Echocardiogram has been performed.  Allen Mayer F 12/11/2017, 3:41 PM

## 2017-12-11 NOTE — ED Triage Notes (Signed)
Pt reports shortness of breath beginning about 1 month ago worsening in the last 2 days. Pt endorses chest pain. Pt denies fevers at home. Pt endorses productive cough.

## 2017-12-11 NOTE — ED Notes (Signed)
Patient walking up and down hall with wife. Patient states he can breathe better when he is up walking.

## 2017-12-11 NOTE — ED Notes (Signed)
ED TO INPATIENT HANDOFF REPORT  Name/Age/Gender Allen Mayer 57 y.o. male  Code Status   Home/SNF/Other Home  Chief Complaint unable to breath/copd  Level of Care/Admitting Diagnosis ED Disposition    ED Disposition Condition [facility] Area: [place] [[id number]]  Level of Care: Telemetry [5]  Admit to tele based on following criteria: Acute CHF  Diagnosis: Acute CHF (congestive heart failure) [facility]) [[id number]]  Admitting Physician: RAI, Vernelle Emerald [[id number]]  Attending Physician: RAI, RIPUDEEP K [[id number]]  Estimated length of stay: past midnight tomorrow  Certification:: I certify this patient will need inpatient services for at least 2 midnights  PT Class (Do Not Modify): Inpatient [101]  PT Acc Code (Do Not Modify): Private [1]       Medical History Past Medical History:  Diagnosis Date  . Acid reflux   . Arthritis   . Asthma   . Back pain   . Bronchitis   . COPD (chronic obstructive pulmonary disease) ([place])   . Coronary artery disease   . Hypertension   . Irregular heart beat   . Neuropathic pain     Allergies No Known Allergies  IV Location/Drains/Wounds Patient Lines/Drains/Airways Status   Active Line/Drains/Airways    Name:   Placement date:   Placement time:   Site:   Days:   Peripheral IV 12/11/17 Left Antecubital   12/11/17    0930    Antecubital   less than 1          Labs/Imaging Results for orders placed or performed during the hospital encounter of 12/11/17 (from the past 48 hour(s))  Comprehensive metabolic panel     Status: Abnormal   Collection Time: 12/11/17  9:14 AM  Result Value Ref Range   Sodium 139 135 - 145 mmol/L   Potassium 4.5 3.5 - 5.1 mmol/L   Chloride 105 98 - 111 mmol/L   CO2 22 22 - 32 mmol/L   Glucose, Bld 118 (H) 70 - 99 mg/dL   BUN 16 6 - 20 mg/dL   Creatinine, Ser 1.45 (H) 0.61 - 1.24 mg/dL   Calcium 8.9 8.9 - 10.3 mg/dL   Total Protein 6.6 6.5 - 8.1 g/dL   Albumin 3.8 3.5 - 5.0  g/dL   AST 80 (H) 15 - 41 U/L   ALT 128 (H) 0 - 44 U/L   Alkaline Phosphatase 56 38 - 126 U/L   Total Bilirubin 1.0 0.3 - 1.2 mg/dL   GFR calc non Af Amer 52 (L) >60 mL/min   GFR calc Af Amer 60 (L) >60 mL/min    Comment: (NOTE) The eGFR has been calculated using the CKD EPI equation. This calculation has not been validated in all clinical situations. eGFR's persistently <60 mL/min signify possible Chronic Kidney Disease.    Anion gap 12 5 - 15    Comment: Performed at [facility], [place] [address], [place], [place] [address]  CBC with Differential     Status: None   Collection Time: 12/11/17  9:14 AM  Result Value Ref Range   WBC 6.6 4.0 - 10.5 K/uL   RBC 5.19 4.22 - 5.81 MIL/uL   Hemoglobin 15.9 13.0 - 17.0 g/dL   HCT 47.4 39.0 - 52.0 %   MCV 91.3 78.0 - 100.0 fL   MCH 30.6 26.0 - 34.0 pg   MCHC 33.5 30.0 - 36.0 g/dL   RDW 14.4 11.5 - 15.5 %   Platelets 231 150 - 400  K/uL   Neutrophils Relative % 59 %   Neutro Abs 4.0 1.7 - 7.7 K/uL   Lymphocytes Relative 27 %   Lymphs Abs 1.8 0.7 - 4.0 K/uL   Monocytes Relative 10 %   Monocytes Absolute 0.6 0.1 - 1.0 K/uL   Eosinophils Relative 3 %   Eosinophils Absolute 0.2 0.0 - 0.7 K/uL   Basophils Relative 1 %   Basophils Absolute 0.0 0.0 - 0.1 K/uL    Comment: Performed at [facility], [place] [address], [place], [place] [address]  Troponin I     Status: Abnormal   Collection Time: 12/11/17  9:14 AM  Result Value Ref Range   Troponin I 0.06 (HH) <0.03 ng/mL    Comment: CRITICAL RESULT CALLED TO, READ BACK BY AND VERIFIED WITH: ROSSER,M. RN AT 1028 12/11/17 MULLINS,T Performed at [facility], [place] [address] [place], [place] [address]   Brain natriuretic peptide     Status: Abnormal   Collection Time: 12/11/17  9:14 AM  Result Value Ref Range   B Natriuretic Peptide 863.3 (H) 0.0 - 100.0 pg/mL    Comment: Performed at [facility], [place] [address]  [place], [place] [address]  Lipase, blood     Status: None   Collection Time: 12/11/17  9:14 AM  Result Value Ref Range   Lipase 34 11 - 51 U/L    Comment: Performed at [facility], [place] [address] [place], [place] [address]  Urine rapid drug screen (hosp performed)     Status: Abnormal   Collection Time: 12/11/17 12:46 PM  Result Value Ref Range   Opiates NONE DETECTED NONE DETECTED   Cocaine POSITIVE (A) NONE DETECTED   Benzodiazepines NONE DETECTED NONE DETECTED   Amphetamines NONE DETECTED NONE DETECTED   Tetrahydrocannabinol NONE DETECTED NONE DETECTED   Barbiturates NONE DETECTED NONE DETECTED    Comment: (NOTE) DRUG SCREEN FOR MEDICAL PURPOSES ONLY.  IF CONFIRMATION IS NEEDED FOR ANY PURPOSE, NOTIFY LAB WITHIN 5 DAYS. LOWEST DETECTABLE LIMITS FOR URINE DRUG SCREEN Drug Class                     Cutoff (ng/mL) Amphetamine and metabolites    1000 Barbiturate and metabolites    200 Benzodiazepine                 680 Tricyclics and metabolites     300 Opiates and metabolites        300 Cocaine and metabolites        300 THC                            50 Performed at [facility], [place] [address], [place], [place] [address]   Urinalysis, Routine w reflex microscopic     Status: Abnormal   Collection Time: 12/11/17 12:47 PM  Result Value Ref Range   Color, Urine YELLOW YELLOW   APPearance CLEAR CLEAR   Specific Gravity, Urine 1.011 1.005 - 1.030   pH 6.0 5.0 - 8.0   Glucose, UA NEGATIVE NEGATIVE mg/dL   Hgb urine dipstick SMALL (A) NEGATIVE   Bilirubin Urine NEGATIVE NEGATIVE   Ketones, ur NEGATIVE NEGATIVE mg/dL   Protein, ur 30 (A) NEGATIVE mg/dL   Nitrite NEGATIVE NEGATIVE   Leukocytes, UA LARGE (A) NEGATIVE   RBC / HPF 0-5 0 - 5 RBC/hpf   WBC, UA 21-50 0 -  5 WBC/hpf   Bacteria, UA NONE SEEN NONE SEEN    Comment: Performed at [facility], [place] [address], [place], [place] [address]   Dg Chest 2 View  Result  Date: 12/11/2017 CLINICAL DATA:  57 year old male with shortness of breath for the past month which is worse over the past 2 days accompanied by chest pain and productive cough EXAM: CHEST - 2 VIEW COMPARISON:  Prior chest x-ray 12/04/2017 FINDINGS: Borderline cardiomegaly. Increased pulmonary vascular congestion and diffuse interstitial prominence. Interval development of a small right-sided pleural effusion. Diffuse bronchial wall thickening and peribronchial cuffing. Nonspecific patchy airspace opacities in both lung bases have a linear appearance most suggestive of atelectasis. No acute osseous abnormality. IMPRESSION: 1. Interval development of a small right-sided pleural effusion and associated right basilar atelectasis. 2. Slightly increased pulmonary vascular congestion and diffuse interstitial prominence. Differential considerations include developing mild interstitial edema versus atypical or viral respiratory infection. 3. Stable borderline cardiomegaly. 4. Persistent advanced bronchitic changes consistent with the clinical history of COPD. Acute on chronic bronchitis is difficult to exclude radiographically. Electronically Signed   By: Jacqulynn Cadet M.D.   On: 12/11/2017 09:52   Ct Angio Chest Pe W And/or Wo Contrast  Result Date: 12/11/2017 CLINICAL DATA:  57 year old male with history of generalized abdominal pain. Mild fever. Shortness of breath for 1 month. Chest pain. EXAM: CT ANGIOGRAPHY CHEST CT ABDOMEN AND PELVIS WITH CONTRAST TECHNIQUE: Multidetector CT imaging of the chest was performed using the standard protocol during bolus administration of intravenous contrast. Multiplanar CT image reconstructions and MIPs were obtained to evaluate the vascular anatomy. Multidetector CT imaging of the abdomen and pelvis was performed using the standard protocol during bolus administration of intravenous contrast. CONTRAST:  149m ISOVUE-370 IOPAMIDOL (ISOVUE-370) INJECTION 76% COMPARISON:  CT the  abdomen and pelvis 01/31/2012. No prior chest CT. FINDINGS: Comment: Study is limited for assessment of pulmonary embolism due to excessive patient respiratory motion. CTA CHEST FINDINGS Cardiovascular: No central, lobar or proximal segmental sized filling defects to suggest pulmonary embolism. Larger segmental and subsegmental size filling defects cannot be entirely excluded secondary to excessive patient respiratory motion. Heart size is mildly enlarged. There is no significant pericardial fluid, thickening or pericardial calcification. There is aortic atherosclerosis, as well as atherosclerosis of the great vessels of the mediastinum and the coronary arteries, including calcified atherosclerotic plaque in the left anterior descending and left circumflex coronary arteries. Mediastinum/Nodes: No pathologically enlarged mediastinal or hilar lymph nodes. Esophagus is unremarkable in appearance. No axillary lymphadenopathy. Lungs/Pleura: Small bilateral pleural effusions lying dependently. Patchy areas of mild ground-glass attenuation and widespread mild interlobular septal thickening, suggesting a background of mild interstitial pulmonary edema. No confluent consolidative airspace disease. No suspicious appearing pulmonary nodules or masses are noted. Musculoskeletal: There are no aggressive appearing lytic or blastic lesions noted in the visualized portions of the skeleton. Review of the MIP images confirms the above findings. CT ABDOMEN and PELVIS FINDINGS Hepatobiliary: No suspicious cystic or solid hepatic lesions. No intra or extrahepatic biliary ductal dilatation. No calcified gallstones. Gallbladder wall appears edematous. Gallbladder does not appear overly distended. No pericholecystic fluid. Pancreas: No pancreatic mass. No pancreatic ductal dilatation. No pancreatic or peripancreatic fluid or inflammatory changes. Spleen: Unremarkable. Adrenals/Urinary Tract: Bilateral kidneys and bilateral adrenal glands  are normal in appearance. No hydroureteronephrosis. Urinary bladder is normal in appearance. Stomach/Bowel: Normal appearance of the stomach. No pathologic dilatation of small bowel or colon. Normal appendix. Vascular/Lymphatic: Aortic atherosclerosis, without evidence of aneurysm or  dissection in the abdominal or pelvic vasculature. No lymphadenopathy noted in the abdomen or pelvis. Reproductive: Prostate gland and seminal vesicles are unremarkable in appearance. Other: Some fluid is noted tracking along the lower aspect of the right side of the retroperitoneum and along the right pelvic sidewall. There is also trace volume of ascites. No pneumoperitoneum. Musculoskeletal: There are no aggressive appearing lytic or blastic lesions noted in the visualized portions of the skeleton. Review of the MIP images confirms the above findings. IMPRESSION: 1. Limited study, which demonstrates no evidence of central, lobar or proximal segmental sized pulmonary embolism. Smaller distal segmental and subsegmental sized emboli cannot be entirely excluded secondary to respiratory motion. 2. Cardiomegaly with evidence of interstitial pulmonary edema and small bilateral pleural effusions suggesting congestive heart failure. 3. Small amount of ascites. Small volume of fluid tracking along the right retroperitoneum into the right side of the pelvis. Gallbladder wall edema. These findings are all favored to be related to underlying hepatic congestion in the setting of congestive heart failure. There are no calcified gallstones. Additionally, the gallbladder does not appear overly distended, and is not surrounded by overt inflammatory changes to suggest a cholecystitis at this time. 4. Aortic atherosclerosis, in addition to 2 vessel coronary artery disease. Please note that although the presence of coronary artery calcium documents the presence of coronary artery disease, the severity of this disease and any potential stenosis cannot be  assessed on this non-gated CT examination. Assessment for potential risk factor modification, dietary therapy or pharmacologic therapy may be warranted, if clinically indicated. Electronically Signed   By: Vinnie Langton M.D.   On: 12/11/2017 12:00   Ct Abdomen Pelvis W Contrast  Result Date: 12/11/2017 CLINICAL DATA:  57 year old male with history of generalized abdominal pain. Mild fever. Shortness of breath for 1 month. Chest pain. EXAM: CT ANGIOGRAPHY CHEST CT ABDOMEN AND PELVIS WITH CONTRAST TECHNIQUE: Multidetector CT imaging of the chest was performed using the standard protocol during bolus administration of intravenous contrast. Multiplanar CT image reconstructions and MIPs were obtained to evaluate the vascular anatomy. Multidetector CT imaging of the abdomen and pelvis was performed using the standard protocol during bolus administration of intravenous contrast. CONTRAST:  153m ISOVUE-370 IOPAMIDOL (ISOVUE-370) INJECTION 76% COMPARISON:  CT the abdomen and pelvis 01/31/2012. No prior chest CT. FINDINGS: Comment: Study is limited for assessment of pulmonary embolism due to excessive patient respiratory motion. CTA CHEST FINDINGS Cardiovascular: No central, lobar or proximal segmental sized filling defects to suggest pulmonary embolism. Larger segmental and subsegmental size filling defects cannot be entirely excluded secondary to excessive patient respiratory motion. Heart size is mildly enlarged. There is no significant pericardial fluid, thickening or pericardial calcification. There is aortic atherosclerosis, as well as atherosclerosis of the great vessels of the mediastinum and the coronary arteries, including calcified atherosclerotic plaque in the left anterior descending and left circumflex coronary arteries. Mediastinum/Nodes: No pathologically enlarged mediastinal or hilar lymph nodes. Esophagus is unremarkable in appearance. No axillary lymphadenopathy. Lungs/Pleura: Small bilateral pleural  effusions lying dependently. Patchy areas of mild ground-glass attenuation and widespread mild interlobular septal thickening, suggesting a background of mild interstitial pulmonary edema. No confluent consolidative airspace disease. No suspicious appearing pulmonary nodules or masses are noted. Musculoskeletal: There are no aggressive appearing lytic or blastic lesions noted in the visualized portions of the skeleton. Review of the MIP images confirms the above findings. CT ABDOMEN and PELVIS FINDINGS Hepatobiliary: No suspicious cystic or solid hepatic lesions. No intra or extrahepatic biliary ductal dilatation. No  calcified gallstones. Gallbladder wall appears edematous. Gallbladder does not appear overly distended. No pericholecystic fluid. Pancreas: No pancreatic mass. No pancreatic ductal dilatation. No pancreatic or peripancreatic fluid or inflammatory changes. Spleen: Unremarkable. Adrenals/Urinary Tract: Bilateral kidneys and bilateral adrenal glands are normal in appearance. No hydroureteronephrosis. Urinary bladder is normal in appearance. Stomach/Bowel: Normal appearance of the stomach. No pathologic dilatation of small bowel or colon. Normal appendix. Vascular/Lymphatic: Aortic atherosclerosis, without evidence of aneurysm or dissection in the abdominal or pelvic vasculature. No lymphadenopathy noted in the abdomen or pelvis. Reproductive: Prostate gland and seminal vesicles are unremarkable in appearance. Other: Some fluid is noted tracking along the lower aspect of the right side of the retroperitoneum and along the right pelvic sidewall. There is also trace volume of ascites. No pneumoperitoneum. Musculoskeletal: There are no aggressive appearing lytic or blastic lesions noted in the visualized portions of the skeleton. Review of the MIP images confirms the above findings. IMPRESSION: 1. Limited study, which demonstrates no evidence of central, lobar or proximal segmental sized pulmonary embolism.  Smaller distal segmental and subsegmental sized emboli cannot be entirely excluded secondary to respiratory motion. 2. Cardiomegaly with evidence of interstitial pulmonary edema and small bilateral pleural effusions suggesting congestive heart failure. 3. Small amount of ascites. Small volume of fluid tracking along the right retroperitoneum into the right side of the pelvis. Gallbladder wall edema. These findings are all favored to be related to underlying hepatic congestion in the setting of congestive heart failure. There are no calcified gallstones. Additionally, the gallbladder does not appear overly distended, and is not surrounded by overt inflammatory changes to suggest a cholecystitis at this time. 4. Aortic atherosclerosis, in addition to 2 vessel coronary artery disease. Please note that although the presence of coronary artery calcium documents the presence of coronary artery disease, the severity of this disease and any potential stenosis cannot be assessed on this non-gated CT examination. Assessment for potential risk factor modification, dietary therapy or pharmacologic therapy may be warranted, if clinically indicated. Electronically Signed   By: Vinnie Langton M.D.   On: 12/11/2017 12:00    Pending Labs [employer] (From admission, onward)    Start     Ordered   Signed and Held  HIV antibody (Routine Testing)  Once,   R     Signed and Held   Signed and Held  Basic metabolic panel  Daily,   R     Signed and Held   Signed and Held  CBC  (heparin)  Once,   R    Comments:  Baseline for heparin therapy IF NOT ALREADY DRAWN.  Notify MD if PLT < 100 K.    Signed and Held   Signed and Held  Creatinine, serum  (heparin)  Once,   R    Comments:  Baseline for heparin therapy IF NOT ALREADY DRAWN.    Signed and Held   Signed and Held  Troponin I  Now then every 6 hours,   R     Signed and Held          Vitals/Pain Today's Vitals   12/11/17 1045 12/11/17 1221 12/11/17 1229  12/11/17 1230  BP: (!) 166/119 (!) 173/103  (!) 154/98  Pulse: 88 97 (!) 101   Resp: 20 (!) 22 (!) 22 15  Temp:      TempSrc:      SpO2: 98% 96% 95% 97%  Weight:      PainSc:        Isolation  Precautions No active isolations  Medications Medications  ipratropium-albuterol (DUONEB) 0.5-2.5 (3) MG/3ML nebulizer solution 3 mL (3 mLs Nebulization Given 12/11/17 0938)  methylPREDNISolone sodium succinate (SOLU-MEDROL) 125 mg/2 mL injection 125 mg (125 mg Intravenous Given 12/11/17 0940)  albuterol (PROVENTIL) (2.5 MG/3ML) 0.083% nebulizer solution 5 mg (5 mg Nebulization Given 12/11/17 1025)  iopamidol (ISOVUE-370) 76 % injection 100 mL (100 mLs Intravenous Contrast Given 12/11/17 1121)  furosemide (LASIX) injection 40 mg (40 mg Intravenous Given 12/11/17 1306)    Mobility walks

## 2017-12-11 NOTE — ED Notes (Signed)
Date and time results received: 12/11/17 10:30 AM  (use smartphrase ".now" to insert current time)  Test: Troponin Critical Value: 0.06  Name of Provider Notified: Ray MD  Orders Received? Or Actions Taken?: Awaiting further orders

## 2017-12-11 NOTE — ED Provider Notes (Addendum)
[facility]-EMERGENCY DEPT Provider Note   CSN: [id number] Arrival date & time: 12/11/17  0851     History   Chief Complaint Chief Complaint  Patient presents with  . Shortness of Breath    HPI Allen Mayer is a 57 y.o. male.  HPI   Allen Mayer is a 57 y.o. male, with a history of asthma, COPD, bronchitis, CAD, and HTN, presenting to the ED with shortness of breath for the past month, worse over the last 2 days. Accompanied by productive cough with white sputum, nausea, and nasal congestion.  Intermittent chest tightness.  He has been using his prescribed inhalers without relief.  He also notes some abdominal distention for the past several weeks. Patient was seen in the ED August 26 for similar symptoms, but left AMA.  Patient also endorses abdominal discomfort and distention for at least the past week.  He states he has not had this before.  He feels as though his abdominal distention is making it more difficult for him to breathe.  He has had greater difficulty laying on his back.  He also endorses occasional chest tightness. Denies fever/chills, vomiting, diarrhea, constipation, peripheral edema/pain, diaphoresis, syncope, dizziness, or any other complaints.    Past Medical History:  Diagnosis Date  . Acid reflux   . Arthritis   . Asthma   . Back pain   . Bronchitis   . COPD (chronic obstructive pulmonary disease) (HCC)   . Coronary artery disease   . Hypertension   . Irregular heart beat   . Neuropathic pain     Patient Active Problem List   Diagnosis Date Noted  . Acute CHF (congestive heart failure) (HCC) 12/11/2017  . Elevated troponin 12/11/2017  . 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 Tobacco abuse 11/19/2014  . Essential hypertension, benign 11/19/2014  . Neuropathic pain     History reviewed. No pertinent surgical history.      Home Medications    Prior to Admission  medications   Medication Sig Start Date End Date Taking? Authorizing Provider  albuterol (PROVENTIL HFA;VENTOLIN HFA) 108 (90 Base) MCG/ACT inhaler Inhale 2 puffs into the lungs every 4 (four) hours as needed for wheezing or shortness of breath. 06/22/17  Yes Pisciotta, Mardella Layman  aspirin EC 81 MG tablet Take 81 mg by mouth daily.   Yes [provider]  gabapentin (NEURONTIN) 400 MG capsule Take 400 mg by mouth at bedtime.   Yes [provider]  lisinopril-hydrochlorothiazide (PRINZIDE,ZESTORETIC) 20-25 MG tablet Take 1 tablet by mouth daily. 06/22/17  Yes Pisciotta, Joni Reining, PA-C  nitroGLYCERIN (NITROSTAT) 0.4 MG SL tablet Place 0.4 mg under the tongue every 5 (five) minutes as needed for chest pain.   Yes [provider]  pantoprazole (PROTONIX) 20 MG tablet Take 20 mg by mouth daily.   Yes [provider]  beclomethasone (QVAR) 80 MCG/ACT inhaler Inhale 1 puff into the lungs 2 (two) times daily as needed. 06/22/17   Pisciotta, Joni Reining, PA-C  benztropine (COGENTIN) 1 MG tablet Take 1 tablet (1 mg total) by mouth at bedtime. 06/22/17   Pisciotta, Joni Reining, PA-C  citalopram (CELEXA) 20 MG tablet Take 1 tablet (20 mg total) by mouth daily. 06/22/17   Pisciotta, Joni Reining, PA-C  paliperidone (INVEGA) 6 MG 24 hr tablet Take 1 tablet (6 mg total) by mouth daily. 06/22/17   Pisciotta, Mardella Layman    Family History History reviewed. No pertinent family history.  Social History Social  History   Tobacco Use  . Smoking status: Current Every Day Smoker    Packs/day: 0.50  . Smokeless tobacco: Never Used  Substance Use Topics  . Alcohol use: Yes    Comment: occas  . Drug use: No    Types: Cocaine, Marijuana     Allergies   Patient has no known allergies.   Review of Systems Review of Systems  Constitutional: Negative for chills, diaphoresis and fever.  HENT: Positive for congestion.   Respiratory: Positive for cough, chest tightness and shortness of breath.     Cardiovascular: Negative for palpitations and leg swelling.  Gastrointestinal: Positive for abdominal distention and nausea. Negative for abdominal pain, blood in stool, diarrhea and vomiting.  Genitourinary: Negative for dysuria, frequency and hematuria.  Musculoskeletal: Negative for back pain.  Neurological: Negative for dizziness and light-headedness.  All other systems reviewed and are negative.    Physical Exam Updated Vital Signs BP (!) 155/103 (BP Location: Left Arm)   Pulse 88   Temp 97.7 F (36.5 C) (Oral)   Resp 20   Wt 104.3 kg   SpO2 97%   BMI 31.19 kg/m   Physical Exam  Constitutional: He appears well-developed and well-nourished. No distress.  HENT:  Head: Normocephalic and atraumatic.  Eyes: Conjunctivae are normal.  Neck: Neck supple.  Cardiovascular: Normal rate, regular rhythm, normal heart sounds and intact distal pulses.  Pulmonary/Chest: Tachypnea noted. He has wheezes (global, expiratory).  Some increased work of breathing, but patient can still speak in full sentences.  Abdominal: Soft. He exhibits distension. There is generalized tenderness. There is no guarding.  Sometimes the patient has diffuse abdominal tenderness, but sometimes he makes no comment in regards to tenderness.  Musculoskeletal: He exhibits no edema.  Lymphadenopathy:    He has no cervical adenopathy.  Neurological: He is alert.  Skin: Skin is warm and dry. He is not diaphoretic.  Psychiatric: He has a normal mood and affect. His behavior is normal.  Nursing note and vitals reviewed.    ED Treatments / Results  Labs (all labs ordered are listed, but only abnormal results are displayed) Labs Reviewed  COMPREHENSIVE METABOLIC PANEL - Abnormal; Notable for the following components:      Result Value   Glucose, Bld 118 (*)    Creatinine, Ser 1.45 (*)    AST 80 (*)    ALT 128 (*)    GFR calc non Af Amer 52 (*)    GFR calc Af Amer 60 (*)    All other components within normal  limits  TROPONIN I - Abnormal; Notable for the following components:   Troponin I 0.06 (*)    All other components within normal limits  BRAIN NATRIURETIC PEPTIDE - Abnormal; Notable for the following components:   B Natriuretic Peptide 863.3 (*)    All other components within normal limits  CBC WITH DIFFERENTIAL/PLATELET  LIPASE, BLOOD  RAPID URINE DRUG SCREEN, HOSP PERFORMED  URINALYSIS, ROUTINE W REFLEX MICROSCOPIC    EKG EKG Interpretation  Date/Time:  Monday December 11 2017 08:56:33 EDT Ventricular Rate:  87 PR Interval:    QRS Duration: 118 QT Interval:  410 QTC Calculation: 494 R Axis:   -26 Text Interpretation:  Normal sinus rhythm T wave inversion in inferior and lateral leads unchanged from first prior of 04 December 2017 Confirmed by Margarita Grizzle [phone]) on 12/11/2017 1:13:17 PM   Radiology Dg Chest 2 View  Result Date: 12/11/2017 CLINICAL DATA:  57 year old male with shortness of  breath for the past month which is worse over the past 2 days accompanied by chest pain and productive cough EXAM: CHEST - 2 VIEW COMPARISON:  Prior chest x-ray 12/04/2017 FINDINGS: Borderline cardiomegaly. Increased pulmonary vascular congestion and diffuse interstitial prominence. Interval development of a small right-sided pleural effusion. Diffuse bronchial wall thickening and peribronchial cuffing. Nonspecific patchy airspace opacities in both lung bases have a linear appearance most suggestive of atelectasis. No acute osseous abnormality. IMPRESSION: 1. Interval development of a small right-sided pleural effusion and associated right basilar atelectasis. 2. Slightly increased pulmonary vascular congestion and diffuse interstitial prominence. Differential considerations include developing mild interstitial edema versus atypical or viral respiratory infection. 3. Stable borderline cardiomegaly. 4. Persistent advanced bronchitic changes consistent with the clinical history of COPD. Acute on chronic  bronchitis is difficult to exclude radiographically. Electronically Signed   By: Malachy Moan M.D.   On: 12/11/2017 09:52   Ct Angio Chest Pe W And/or Wo Contrast  Result Date: 12/11/2017 CLINICAL DATA:  57 year old male with history of generalized abdominal pain. Mild fever. Shortness of breath for 1 month. Chest pain. EXAM: CT ANGIOGRAPHY CHEST CT ABDOMEN AND PELVIS WITH CONTRAST TECHNIQUE: Multidetector CT imaging of the chest was performed using the standard protocol during bolus administration of intravenous contrast. Multiplanar CT image reconstructions and MIPs were obtained to evaluate the vascular anatomy. Multidetector CT imaging of the abdomen and pelvis was performed using the standard protocol during bolus administration of intravenous contrast. CONTRAST:  ISOVUE-370 IOPAMIDOL (ISOVUE-370) INJECTION 76% COMPARISON:  CT the abdomen and pelvis 01/31/2012. No prior chest CT. FINDINGS: Comment: Study is limited for assessment of pulmonary embolism due to excessive patient respiratory motion. CTA CHEST FINDINGS Cardiovascular: No central, lobar or proximal segmental sized filling defects to suggest pulmonary embolism. Larger segmental and subsegmental size filling defects cannot be entirely excluded secondary to excessive patient respiratory motion. Heart size is mildly enlarged. There is no significant pericardial fluid, thickening or pericardial calcification. There is aortic atherosclerosis, as well as atherosclerosis of the great vessels of the mediastinum and the coronary arteries, including calcified atherosclerotic plaque in the left anterior descending and left circumflex coronary arteries. Mediastinum/Nodes: No pathologically enlarged mediastinal or hilar lymph nodes. Esophagus is unremarkable in appearance. No axillary lymphadenopathy. Lungs/Pleura: Small bilateral pleural effusions lying dependently. Patchy areas of mild ground-glass attenuation and widespread mild interlobular  septal thickening, suggesting a background of mild interstitial pulmonary edema. No confluent consolidative airspace disease. No suspicious appearing pulmonary nodules or masses are noted. Musculoskeletal: There are no aggressive appearing lytic or blastic lesions noted in the visualized portions of the skeleton. Review of the MIP images confirms the above findings. CT ABDOMEN and PELVIS FINDINGS Hepatobiliary: No suspicious cystic or solid hepatic lesions. No intra or extrahepatic biliary ductal dilatation. No calcified gallstones. Gallbladder wall appears edematous. Gallbladder does not appear overly distended. No pericholecystic fluid. Pancreas: No pancreatic mass. No pancreatic ductal dilatation. No pancreatic or peripancreatic fluid or inflammatory changes. Spleen: Unremarkable. Adrenals/Urinary Tract: Bilateral kidneys and bilateral adrenal glands are normal in appearance. No hydroureteronephrosis. Urinary bladder is normal in appearance. Stomach/Bowel: Normal appearance of the stomach. No pathologic dilatation of small bowel or colon. Normal appendix. Vascular/Lymphatic: Aortic atherosclerosis, without evidence of aneurysm or dissection in the abdominal or pelvic vasculature. No lymphadenopathy noted in the abdomen or pelvis. Reproductive: Prostate gland and seminal vesicles are unremarkable in appearance. Other: Some fluid is noted tracking along the lower aspect of the right side of the retroperitoneum and along the right  pelvic sidewall. There is also trace volume of ascites. No pneumoperitoneum. Musculoskeletal: There are no aggressive appearing lytic or blastic lesions noted in the visualized portions of the skeleton. Review of the MIP images confirms the above findings. IMPRESSION: 1. Limited study, which demonstrates no evidence of central, lobar or proximal segmental sized pulmonary embolism. Smaller distal segmental and subsegmental sized emboli cannot be entirely excluded secondary to respiratory  motion. 2. Cardiomegaly with evidence of interstitial pulmonary edema and small bilateral pleural effusions suggesting congestive heart failure. 3. Small amount of ascites. Small volume of fluid tracking along the right retroperitoneum into the right side of the pelvis. Gallbladder wall edema. These findings are all favored to be related to underlying hepatic congestion in the setting of congestive heart failure. There are no calcified gallstones. Additionally, the gallbladder does not appear overly distended, and is not surrounded by overt inflammatory changes to suggest a cholecystitis at this time. 4. Aortic atherosclerosis, in addition to 2 vessel coronary artery disease. Please note that although the presence of coronary artery calcium documents the presence of coronary artery disease, the severity of this disease and any potential stenosis cannot be assessed on this non-gated CT examination. Assessment for potential risk factor modification, dietary therapy or pharmacologic therapy may be warranted, if clinically indicated. Electronically Signed   By: Trudie Reed M.D.   On: 12/11/2017 12:00   Ct Abdomen Pelvis W Contrast  Result Date: 12/11/2017 CLINICAL DATA:  57 year old male with history of generalized abdominal pain. Mild fever. Shortness of breath for 1 month. Chest pain. EXAM: CT ANGIOGRAPHY CHEST CT ABDOMEN AND PELVIS WITH CONTRAST TECHNIQUE: Multidetector CT imaging of the chest was performed using the standard protocol during bolus administration of intravenous contrast. Multiplanar CT image reconstructions and MIPs were obtained to evaluate the vascular anatomy. Multidetector CT imaging of the abdomen and pelvis was performed using the standard protocol during bolus administration of intravenous contrast. CONTRAST:  ISOVUE-370 IOPAMIDOL (ISOVUE-370) INJECTION 76% COMPARISON:  CT the abdomen and pelvis 01/31/2012. No prior chest CT. FINDINGS: Comment: Study is limited for assessment of  pulmonary embolism due to excessive patient respiratory motion. CTA CHEST FINDINGS Cardiovascular: No central, lobar or proximal segmental sized filling defects to suggest pulmonary embolism. Larger segmental and subsegmental size filling defects cannot be entirely excluded secondary to excessive patient respiratory motion. Heart size is mildly enlarged. There is no significant pericardial fluid, thickening or pericardial calcification. There is aortic atherosclerosis, as well as atherosclerosis of the great vessels of the mediastinum and the coronary arteries, including calcified atherosclerotic plaque in the left anterior descending and left circumflex coronary arteries. Mediastinum/Nodes: No pathologically enlarged mediastinal or hilar lymph nodes. Esophagus is unremarkable in appearance. No axillary lymphadenopathy. Lungs/Pleura: Small bilateral pleural effusions lying dependently. Patchy areas of mild ground-glass attenuation and widespread mild interlobular septal thickening, suggesting a background of mild interstitial pulmonary edema. No confluent consolidative airspace disease. No suspicious appearing pulmonary nodules or masses are noted. Musculoskeletal: There are no aggressive appearing lytic or blastic lesions noted in the visualized portions of the skeleton. Review of the MIP images confirms the above findings. CT ABDOMEN and PELVIS FINDINGS Hepatobiliary: No suspicious cystic or solid hepatic lesions. No intra or extrahepatic biliary ductal dilatation. No calcified gallstones. Gallbladder wall appears edematous. Gallbladder does not appear overly distended. No pericholecystic fluid. Pancreas: No pancreatic mass. No pancreatic ductal dilatation. No pancreatic or peripancreatic fluid or inflammatory changes. Spleen: Unremarkable. Adrenals/Urinary Tract: Bilateral kidneys and bilateral adrenal glands are normal in appearance. No  hydroureteronephrosis. Urinary bladder is normal in appearance.  Stomach/Bowel: Normal appearance of the stomach. No pathologic dilatation of small bowel or colon. Normal appendix. Vascular/Lymphatic: Aortic atherosclerosis, without evidence of aneurysm or dissection in the abdominal or pelvic vasculature. No lymphadenopathy noted in the abdomen or pelvis. Reproductive: Prostate gland and seminal vesicles are unremarkable in appearance. Other: Some fluid is noted tracking along the lower aspect of the right side of the retroperitoneum and along the right pelvic sidewall. There is also trace volume of ascites. No pneumoperitoneum. Musculoskeletal: There are no aggressive appearing lytic or blastic lesions noted in the visualized portions of the skeleton. Review of the MIP images confirms the above findings. IMPRESSION: 1. Limited study, which demonstrates no evidence of central, lobar or proximal segmental sized pulmonary embolism. Smaller distal segmental and subsegmental sized emboli cannot be entirely excluded secondary to respiratory motion. 2. Cardiomegaly with evidence of interstitial pulmonary edema and small bilateral pleural effusions suggesting congestive heart failure. 3. Small amount of ascites. Small volume of fluid tracking along the right retroperitoneum into the right side of the pelvis. Gallbladder wall edema. These findings are all favored to be related to underlying hepatic congestion in the setting of congestive heart failure. There are no calcified gallstones. Additionally, the gallbladder does not appear overly distended, and is not surrounded by overt inflammatory changes to suggest a cholecystitis at this time. 4. Aortic atherosclerosis, in addition to 2 vessel coronary artery disease. Please note that although the presence of coronary artery calcium documents the presence of coronary artery disease, the severity of this disease and any potential stenosis cannot be assessed on this non-gated CT examination. Assessment for potential risk factor modification,  dietary therapy or pharmacologic therapy may be warranted, if clinically indicated. Electronically Signed   By: Trudie Reed M.D.   On: 12/11/2017 12:00    Procedures .Critical Care Performed by: Anselm Pancoast, PA-C Authorized by: Anselm Pancoast, PA-C   Critical care provider statement:    Critical care time (minutes):  35   Critical care time was exclusive of:  Separately billable procedures and treating other patients   Critical care was necessary to treat or prevent imminent or life-threatening deterioration of the following conditions:  Cardiac failure   Critical care was time spent personally by me on the following activities:  Development of treatment plan with patient or surrogate, discussions with consultants, evaluation of patient's response to treatment, examination of patient, obtaining history from patient or surrogate, review of old charts, re-evaluation of patient's condition, pulse oximetry, ordering and review of radiographic studies, ordering and review of laboratory studies and ordering and performing treatments and interventions   I assumed direction of critical care for this patient from another provider in my specialty: no     (including critical care time)  Medications Ordered in ED Medications  ipratropium-albuterol (DUONEB) 0.5-2.5 (3) MG/3ML nebulizer solution 3 mL (3 mLs Nebulization Given 12/11/17 0938)  methylPREDNISolone sodium succinate (SOLU-MEDROL) 125 mg/2 mL injection 125 mg (125 mg Intravenous Given 12/11/17 0940)  albuterol (PROVENTIL) (2.5 MG/3ML) 0.083% nebulizer solution 5 mg (5 mg Nebulization Given 12/11/17 1025)  iopamidol (ISOVUE-370) 76 % injection 100 mL (100 mLs Intravenous Contrast Given 12/11/17 1121)  furosemide (LASIX) injection 40 mg (40 mg Intravenous Given 12/11/17 1306)     Initial Impression / Assessment and Plan / ED Course  I have reviewed the triage vital signs and the nursing notes.  Pertinent labs & imaging results that were available  during my care of  the patient were reviewed by me and considered in my medical decision making (see chart for details).  Clinical Course as of Dec 12 1311  Mon Dec 11, 2017  1007 Patient states breathing has improved some.  Lung sounds have significantly improved with only minor, occasional wheezing noted.   [SJ]  1253 Spoke with Dr. Elease Hashimoto, cardiologist.  He advises for [place] to admit via the hospitalist here at [facility].  Cardiology will consult.  Initiate 40 mg IV Lasix.  Add on UDS.   [SJ]  1309 Spoke with Dr. Isidoro Donning, hospitalist. Agrees to admit the patient.    [SJ]    Clinical Course User Index [SJ] Garrett Bowring C, PA-C    Patient presents with shortness of breath.  Evidence of pulmonary edema on CT, BNP elevated at around 860, and troponin slightly elevated at 0.06.  No ST changes noted on EKG.  Suspect patient's abdominal distention, shortness of breath, and pulmonary edema are due to new onset CHF.  We will admit the patient for further evaluation and management.  Findings and plan of care discussed with Margarita Grizzle, MD. Dr. Rosalia Hammers personally evaluated and examined this patient.  Vitals:   12/11/17 1037 12/11/17 1045 12/11/17 1221 12/11/17 1229  BP:  (!) 166/119 (!) 173/103   Pulse: 83 88 97 (!) 101  Resp: 16 20 (!) 22 (!) 22  Temp:      TempSrc:      SpO2: 99% 98% 96% 95%  Weight:       Suspect patient's increase in pulse is due to him getting up and walking around the halls. States he feels better sometimes when he walks.  No respiratory distress noted during these times.  Final Clinical Impressions(s) / ED Diagnoses   Final diagnoses:  SOB (shortness of breath)  Acute congestive heart failure, unspecified heart failure type [facility])    ED Discharge Orders    None       Anselm Pancoast, PA-C 12/11/17 1315    Anselm Pancoast, PA-C 12/11/17 1316    Margarita Grizzle, MD 12/11/17 1549

## 2017-12-11 NOTE — Discharge Planning (Signed)
EDCM contacted to assist with follow-up appointment.  Pt has [employer] with an assigned PCP.  Pt  must contact DSS to have PCP changed in order to be seen by another MD.   Misha Antonini J. Lucretia Roers, RN, BSN, [place] [phone]

## 2017-12-11 NOTE — H&P (Signed)
History and Physical        Hospital Admission Note Date: 12/11/2017  Patient name: Allen Mayer Medical record number: [id number] Date of birth: October 14, 1960 Age: 57 y.o. Gender: male  PCP: Medicine, [facility]    Patient coming from: Home  I have reviewed all records in the [facility].    Chief Complaint:  Shortness of breath with chest heaviness for last 1 week  HPI: Patient is a 57 year old male with history of COPD, active smoker, hypertension, GERD presented to ED with shortness of breath for last 1 week, progressively worse over the last 2 days.  Patient states that he has been having intermittent shortness of breath due to COPD accompanied with congestion, wheezing, productive cough with whitish sputum.  He used his albuterol inhaler without significant relief.  However he also noticed that in the 1 week, he was having worsening abdominal distention, shortness of breath worse on lying down.  In the last 2 days he is also having midsternal chest tightness with shortness of breath.  Patient reports he takes some of his blood pressure medications otherwise has not been taking many of his medications.  ED work-up/course:  Temp 97.7, respiratory 20, pulse 88, BP 155/103 CMET showed sodium 139, potassium 4.5, BUN 16, creatinine 1.45 (on 12/04/2017, creatinine was 1.2) WBC 6.6 hemoglobin 15.9 BNP elevated 863.3, troponin elevated 0.06 EKG showed rate 87, normal sinus rhythm, T wave inversions in 2 3 aVF and lateral leads   Review of Systems: Positives marked in 'bold' Constitutional: Denies fever, chills, diaphoresis, poor appetite and fatigue.  HEENT: Denies photophobia, eye pain, redness, hearing loss, ear pain, congestion, sore throat, rhinorrhea, sneezing, mouth sores, trouble swallowing, neck pain, neck stiffness and tinnitus.   Respiratory:  Please see HPI  Cardiovascular: Please see HPI Gastrointestinal: Denies nausea, vomiting, abdominal pain, diarrhea, constipation, blood in stool and abdominal distention.  Genitourinary: Denies dysuria, urgency, frequency, hematuria, flank pain and difficulty urinating.  Musculoskeletal: Denies myalgias, back pain, joint swelling, arthralgias and gait problem.  Skin: Denies pallor, rash and wound.  Neurological: Denies dizziness, seizures, syncope, weakness, light-headedness, numbness and headaches.  Hematological: Denies adenopathy. Easy bruising, personal or family bleeding history  Psychiatric/Behavioral: Denies suicidal ideation, mood changes, confusion, nervousness, sleep disturbance and agitation  Past Medical History: Past Medical History:  Diagnosis Date  . Acid reflux   . Arthritis   . Asthma   . Back pain   . Bronchitis   . COPD (chronic obstructive pulmonary disease) (HCC)   . Coronary artery disease   . Hypertension   . Irregular heart beat   . Neuropathic pain     History reviewed. No pertinent surgical history.  Medications: Prior to Admission medications   Medication Sig Start Date End Date Taking? Authorizing Provider  albuterol (PROVENTIL HFA;VENTOLIN HFA) 108 (90 Base) MCG/ACT inhaler Inhale 2 puffs into the lungs every 4 (four) hours as needed for wheezing or shortness of breath. 06/22/17  Yes Pisciotta, Mardella Layman  aspirin EC 81 MG tablet Take 81 mg by mouth daily.   Yes [provider]  gabapentin (NEURONTIN) 400 MG capsule Take 400 mg by mouth at bedtime.  Yes [provider]  lisinopril-hydrochlorothiazide (PRINZIDE,ZESTORETIC) 20-25 MG tablet Take 1 tablet by mouth daily. 06/22/17  Yes Pisciotta, Joni Reining, PA-C  nitroGLYCERIN (NITROSTAT) 0.4 MG SL tablet Place 0.4 mg under the tongue every 5 (five) minutes as needed for chest pain.   Yes [provider]  pantoprazole (PROTONIX) 20 MG tablet Take 20 mg by mouth daily.   Yes [provider]  beclomethasone (QVAR) 80 MCG/ACT inhaler Inhale 1 puff into the lungs 2 (two) times daily as needed. 06/22/17   Pisciotta, Joni Reining, PA-C  benztropine (COGENTIN) 1 MG tablet Take 1 tablet (1 mg total) by mouth at bedtime. 06/22/17   Pisciotta, Joni Reining, PA-C  citalopram (CELEXA) 20 MG tablet Take 1 tablet (20 mg total) by mouth daily. 06/22/17   Pisciotta, Joni Reining, PA-C  paliperidone (INVEGA) 6 MG 24 hr tablet Take 1 tablet (6 mg total) by mouth daily. 06/22/17   Pisciotta, Joni Reining, PA-C    Allergies:  No Known Allergies  Social History:  reports that he has been smoking. He has been smoking about 0.50 packs per day. He has never used smokeless tobacco. He reports that he drinks alcohol. He reports that he does not use drugs.  Family History: Patient reports that both of his parents had heart disease, one brother has diabetes mellitus.  No history of malignancy in family.  Physical Exam: Blood pressure (!) 154/98, pulse (!) 101, temperature 97.7 F (36.5 C), temperature source Oral, resp. rate 15, weight 104.3 kg, SpO2 97 %. General: Alert, awake, oriented x3, in no acute distress, sitting up, speaking in full sentences. Eyes: pink conjunctiva,anicteric sclera, pupils equal and reactive to light and accomodation, HEENT: normocephalic, atraumatic, oropharynx clear Neck: supple, no masses or lymphadenopathy, no goiter, no bruits, no JVD CVS: Regular rate and rhythm, without murmurs, rubs or gallops. No lower extremity edema Resp : Decreased breath sound at the bases, no wheezing at the time of my examination GI : Soft, nontender, abdominal distention, positive bowel sounds, no masses. No hepatomegaly. No hernia.  Musculoskeletal: No clubbing or cyanosis, positive pedal pulses. No contracture. ROM intact  Neuro: Grossly intact, no focal neurological deficits, strength 5/5 upper and lower extremities bilaterally Psych: alert and oriented x 3, normal mood and affect Skin: no rashes or  lesions, warm and dry   LABS on Admission: I have personally reviewed all the labs and imagings below    Basic Metabolic Panel: Recent Labs  Lab 12/11/17 0914  NA 139  K 4.5  CL 105  CO2 22  GLUCOSE 118*  BUN 16  CREATININE 1.45*  CALCIUM 8.9   Liver Function Tests: Recent Labs  Lab 12/11/17 0914  AST 80*  ALT 128*  ALKPHOS 56  BILITOT 1.0  PROT 6.6  ALBUMIN 3.8   Recent Labs  Lab 12/11/17 0914  LIPASE 34   No results for input(s): AMMONIA in the last 168 hours. CBC: Recent Labs  Lab 12/11/17 0914  WBC 6.6  NEUTROABS 4.0  HGB 15.9  HCT 47.4  MCV 91.3  PLT 231   Cardiac Enzymes: Recent Labs  Lab 12/11/17 0914  TROPONINI 0.06*   BNP: Invalid input(s): POCBNP CBG: No results for input(s): GLUCAP in the last 168 hours.  Radiological Exams on Admission:  Dg Chest 2 View  Result Date: 12/11/2017 CLINICAL DATA:  57 year old male with shortness of breath for the past month which is worse over the past 2 days accompanied by chest pain and productive cough EXAM: CHEST - 2 VIEW COMPARISON:  Prior chest x-ray 12/04/2017 FINDINGS: Borderline cardiomegaly. Increased pulmonary vascular congestion and diffuse interstitial prominence. Interval development of a small right-sided pleural effusion. Diffuse bronchial wall thickening and peribronchial cuffing. Nonspecific patchy airspace opacities in both lung bases have a linear appearance most suggestive of atelectasis. No acute osseous abnormality. IMPRESSION: 1. Interval development of a small right-sided pleural effusion and associated right basilar atelectasis. 2. Slightly increased pulmonary vascular congestion and diffuse interstitial prominence. Differential considerations include developing mild interstitial edema versus atypical or viral respiratory infection. 3. Stable borderline cardiomegaly. 4. Persistent advanced bronchitic changes consistent with the clinical history of COPD. Acute on chronic bronchitis is  difficult to exclude radiographically. Electronically Signed   By: Malachy Moan M.D.   On: 12/11/2017 09:52   Ct Angio Chest Pe W And/or Wo Contrast  Result Date: 12/11/2017 CLINICAL DATA:  57 year old male with history of generalized abdominal pain. Mild fever. Shortness of breath for 1 month. Chest pain. EXAM: CT ANGIOGRAPHY CHEST CT ABDOMEN AND PELVIS WITH CONTRAST TECHNIQUE: Multidetector CT imaging of the chest was performed using the standard protocol during bolus administration of intravenous contrast. Multiplanar CT image reconstructions and MIPs were obtained to evaluate the vascular anatomy. Multidetector CT imaging of the abdomen and pelvis was performed using the standard protocol during bolus administration of intravenous contrast. CONTRAST:  ISOVUE-370 IOPAMIDOL (ISOVUE-370) INJECTION 76% COMPARISON:  CT the abdomen and pelvis 01/31/2012. No prior chest CT. FINDINGS: Comment: Study is limited for assessment of pulmonary embolism due to excessive patient respiratory motion. CTA CHEST FINDINGS Cardiovascular: No central, lobar or proximal segmental sized filling defects to suggest pulmonary embolism. Larger segmental and subsegmental size filling defects cannot be entirely excluded secondary to excessive patient respiratory motion. Heart size is mildly enlarged. There is no significant pericardial fluid, thickening or pericardial calcification. There is aortic atherosclerosis, as well as atherosclerosis of the great vessels of the mediastinum and the coronary arteries, including calcified atherosclerotic plaque in the left anterior descending and left circumflex coronary arteries. Mediastinum/Nodes: No pathologically enlarged mediastinal or hilar lymph nodes. Esophagus is unremarkable in appearance. No axillary lymphadenopathy. Lungs/Pleura: Small bilateral pleural effusions lying dependently. Patchy areas of mild ground-glass attenuation and widespread mild interlobular septal thickening,  suggesting a background of mild interstitial pulmonary edema. No confluent consolidative airspace disease. No suspicious appearing pulmonary nodules or masses are noted. Musculoskeletal: There are no aggressive appearing lytic or blastic lesions noted in the visualized portions of the skeleton. Review of the MIP images confirms the above findings. CT ABDOMEN and PELVIS FINDINGS Hepatobiliary: No suspicious cystic or solid hepatic lesions. No intra or extrahepatic biliary ductal dilatation. No calcified gallstones. Gallbladder wall appears edematous. Gallbladder does not appear overly distended. No pericholecystic fluid. Pancreas: No pancreatic mass. No pancreatic ductal dilatation. No pancreatic or peripancreatic fluid or inflammatory changes. Spleen: Unremarkable. Adrenals/Urinary Tract: Bilateral kidneys and bilateral adrenal glands are normal in appearance. No hydroureteronephrosis. Urinary bladder is normal in appearance. Stomach/Bowel: Normal appearance of the stomach. No pathologic dilatation of small bowel or colon. Normal appendix. Vascular/Lymphatic: Aortic atherosclerosis, without evidence of aneurysm or dissection in the abdominal or pelvic vasculature. No lymphadenopathy noted in the abdomen or pelvis. Reproductive: Prostate gland and seminal vesicles are unremarkable in appearance. Other: Some fluid is noted tracking along the lower aspect of the right side of the retroperitoneum and along the right pelvic sidewall. There is also trace volume of ascites. No pneumoperitoneum. Musculoskeletal: There are no aggressive appearing lytic or blastic lesions noted in the visualized portions of  the skeleton. Review of the MIP images confirms the above findings. IMPRESSION: 1. Limited study, which demonstrates no evidence of central, lobar or proximal segmental sized pulmonary embolism. Smaller distal segmental and subsegmental sized emboli cannot be entirely excluded secondary to respiratory motion. 2.  Cardiomegaly with evidence of interstitial pulmonary edema and small bilateral pleural effusions suggesting congestive heart failure. 3. Small amount of ascites. Small volume of fluid tracking along the right retroperitoneum into the right side of the pelvis. Gallbladder wall edema. These findings are all favored to be related to underlying hepatic congestion in the setting of congestive heart failure. There are no calcified gallstones. Additionally, the gallbladder does not appear overly distended, and is not surrounded by overt inflammatory changes to suggest a cholecystitis at this time. 4. Aortic atherosclerosis, in addition to 2 vessel coronary artery disease. Please note that although the presence of coronary artery calcium documents the presence of coronary artery disease, the severity of this disease and any potential stenosis cannot be assessed on this non-gated CT examination. Assessment for potential risk factor modification, dietary therapy or pharmacologic therapy may be warranted, if clinically indicated. Electronically Signed   By: Trudie Reed M.D.   On: 12/11/2017 12:00   Ct Abdomen Pelvis W Contrast  Result Date: 12/11/2017 CLINICAL DATA:  57 year old male with history of generalized abdominal pain. Mild fever. Shortness of breath for 1 month. Chest pain. EXAM: CT ANGIOGRAPHY CHEST CT ABDOMEN AND PELVIS WITH CONTRAST TECHNIQUE: Multidetector CT imaging of the chest was performed using the standard protocol during bolus administration of intravenous contrast. Multiplanar CT image reconstructions and MIPs were obtained to evaluate the vascular anatomy. Multidetector CT imaging of the abdomen and pelvis was performed using the standard protocol during bolus administration of intravenous contrast. CONTRAST:  ISOVUE-370 IOPAMIDOL (ISOVUE-370) INJECTION 76% COMPARISON:  CT the abdomen and pelvis 01/31/2012. No prior chest CT. FINDINGS: Comment: Study is limited for assessment of pulmonary  embolism due to excessive patient respiratory motion. CTA CHEST FINDINGS Cardiovascular: No central, lobar or proximal segmental sized filling defects to suggest pulmonary embolism. Larger segmental and subsegmental size filling defects cannot be entirely excluded secondary to excessive patient respiratory motion. Heart size is mildly enlarged. There is no significant pericardial fluid, thickening or pericardial calcification. There is aortic atherosclerosis, as well as atherosclerosis of the great vessels of the mediastinum and the coronary arteries, including calcified atherosclerotic plaque in the left anterior descending and left circumflex coronary arteries. Mediastinum/Nodes: No pathologically enlarged mediastinal or hilar lymph nodes. Esophagus is unremarkable in appearance. No axillary lymphadenopathy. Lungs/Pleura: Small bilateral pleural effusions lying dependently. Patchy areas of mild ground-glass attenuation and widespread mild interlobular septal thickening, suggesting a background of mild interstitial pulmonary edema. No confluent consolidative airspace disease. No suspicious appearing pulmonary nodules or masses are noted. Musculoskeletal: There are no aggressive appearing lytic or blastic lesions noted in the visualized portions of the skeleton. Review of the MIP images confirms the above findings. CT ABDOMEN and PELVIS FINDINGS Hepatobiliary: No suspicious cystic or solid hepatic lesions. No intra or extrahepatic biliary ductal dilatation. No calcified gallstones. Gallbladder wall appears edematous. Gallbladder does not appear overly distended. No pericholecystic fluid. Pancreas: No pancreatic mass. No pancreatic ductal dilatation. No pancreatic or peripancreatic fluid or inflammatory changes. Spleen: Unremarkable. Adrenals/Urinary Tract: Bilateral kidneys and bilateral adrenal glands are normal in appearance. No hydroureteronephrosis. Urinary bladder is normal in appearance. Stomach/Bowel: Normal  appearance of the stomach. No pathologic dilatation of small bowel or colon. Normal appendix. Vascular/Lymphatic: Aortic atherosclerosis,  without evidence of aneurysm or dissection in the abdominal or pelvic vasculature. No lymphadenopathy noted in the abdomen or pelvis. Reproductive: Prostate gland and seminal vesicles are unremarkable in appearance. Other: Some fluid is noted tracking along the lower aspect of the right side of the retroperitoneum and along the right pelvic sidewall. There is also trace volume of ascites. No pneumoperitoneum. Musculoskeletal: There are no aggressive appearing lytic or blastic lesions noted in the visualized portions of the skeleton. Review of the MIP images confirms the above findings. IMPRESSION: 1. Limited study, which demonstrates no evidence of central, lobar or proximal segmental sized pulmonary embolism. Smaller distal segmental and subsegmental sized emboli cannot be entirely excluded secondary to respiratory motion. 2. Cardiomegaly with evidence of interstitial pulmonary edema and small bilateral pleural effusions suggesting congestive heart failure. 3. Small amount of ascites. Small volume of fluid tracking along the right retroperitoneum into the right side of the pelvis. Gallbladder wall edema. These findings are all favored to be related to underlying hepatic congestion in the setting of congestive heart failure. There are no calcified gallstones. Additionally, the gallbladder does not appear overly distended, and is not surrounded by overt inflammatory changes to suggest a cholecystitis at this time. 4. Aortic atherosclerosis, in addition to 2 vessel coronary artery disease. Please note that although the presence of coronary artery calcium documents the presence of coronary artery disease, the severity of this disease and any potential stenosis cannot be assessed on this non-gated CT examination. Assessment for potential risk factor modification, dietary therapy or  pharmacologic therapy may be warranted, if clinically indicated. Electronically Signed   By: Trudie Reed M.D.   On: 12/11/2017 12:00      EKG: Independently reviewed.  EKG showed rate 87, normal sinus rhythm, T wave inversions in 2 3 aVF and lateral leads    Assessment/Plan Principal Problem:   Acute CHF (congestive heart failure) (HCC), EF unknown -Risk factors of uncontrolled hypertension, family history, noncompliance -BNP elevated, CT angiogram of the chest negative for PE however showed cardiomegaly with interstitial pulmonary edema, small bilateral pleural effusions suggesting CHF, small amount of ascites likely due to hepatic congestion in the setting of CHF, two-vessel CAD with aortic atherosclerosis -Obtain serial cardiac enzymes, strict I's and O's and daily weights -Obtain 2D echocardiogram, placed on IV Lasix -Hold beta-blocker secondary to acute decompensation, hold ACE inhibitor secondary to renal insufficiency -Cardiology has been consulted and will follow, may need further ischemic work-up  Active Problems:    COPD (chronic obstructive pulmonary disease) (HCC) with mild acute exacerbation -At the time of my examination not wheezing, however patient had received breathing treatments x2, start Medrol 125 mg IV x1 -Given tachycardia, placed on Xopenex, Atrovent scheduled, beclomethasone twice daily -Placed on prednisone 40 mg p.o. daily x5 days, doxycycline    Tobacco abuse -Counseled strongly on tobacco cessation, placed on nicotine patch    Essential hypertension, benign -Continue IV Lasix, placed on hydralazine    Elevated troponin with abn EKG -Continue management as #1, follow 2D echo, serial cardiac enzymes -Cardiology consulted  Mild acute kidney injury -Possibly due to cardiorenal syndrome, decreased renal perfusion due to acute CHF -Hold HCTZ, lisinopril -Placed on Lasix, will reassess in a.m. -CT abdomen pelvis does not show any obstruction or  hydronephrosis.   DVT prophylaxis: Heparin subcu  CODE STATUS: Full code  Consults called: Cardiology, Dr Elease Hashimoto has been consulted by EDP,  Family Communication: Admission, patients condition and plan of care including tests being ordered have been  discussed with the patient and wife who indicates understanding and agree with the plan and Code Status  Admission status: Inpatient, telemetry  Disposition plan: Further plan will depend as patient's clinical course evolves and further radiologic and laboratory data become available.    At the time of admission, it appears that the appropriate admission status for this patient is INPATIENT . This is judged to be reasonable and necessary in order to provide the required intensity of service to ensure the patient's safety given the presenting symptoms acute shortness of breath with underlying CHF, COPD, elevated troponin, physical exam findings, and initial radiographic and laboratory data in the context of their chronic comorbidities.  The medical decision making on this patient was of high complexity and the patient is at high risk for clinical deterioration, therefore this is a level 3 visit.   Time Spent on Admission:     Ripudeep Rai M.D. [facility] 12/11/2017, 1:36 PM Pager: [phone]  If 7PM-7AM, please contact night-coverage [email or web address] Password [email or web address]

## 2017-12-11 NOTE — Plan of Care (Signed)

## 2017-12-11 NOTE — Progress Notes (Signed)
Patient ambulating around unit at a fast pace with wife.  Patient c/o 'cramping all over' pointing to abdomen and chest while appearing slightly agitated.  Educated patient to stay at rest for the time being, placed patient on 02 via nasal cannula, patient received 1 tablet of nitro and scheduled medications.  Patient states he feels better at this time.  Dr. Isidoro Donning on floor, notified, new orders received.  Patient currently having echo, resting comfortably in bed.  Will continue to monitor.

## 2017-12-12 DIAGNOSIS — I5021 Acute systolic (congestive) heart failure: Secondary | ICD-10-CM

## 2017-12-12 DIAGNOSIS — R0602 Shortness of breath: Secondary | ICD-10-CM

## 2017-12-12 DIAGNOSIS — I2510 Atherosclerotic heart disease of native coronary artery without angina pectoris: Secondary | ICD-10-CM

## 2017-12-12 DIAGNOSIS — I420 Dilated cardiomyopathy: Secondary | ICD-10-CM

## 2017-12-12 LAB — BASIC METABOLIC PANEL
Anion gap: 12 (ref 5–15)
BUN: 20 mg/dL (ref 6–20)
CALCIUM: 9.3 mg/dL (ref 8.9–10.3)
CO2: 27 mmol/L (ref 22–32)
CREATININE: 1.31 mg/dL — AB (ref 0.61–1.24)
Chloride: 102 mmol/L (ref 98–111)
GFR, EST NON AFRICAN AMERICAN: 58 mL/min — AB (ref >60)
Glucose, Bld: 146 mg/dL — ABNORMAL HIGH (ref 70–99)
Potassium: 4.3 mmol/L (ref 3.5–5.1)
SODIUM: 141 mmol/L (ref 135–145)

## 2017-12-12 LAB — TROPONIN I: TROPONIN I: 0.04 ng/mL — AB (ref <0.03)

## 2017-12-12 LAB — HIV ANTIBODY (ROUTINE TESTING W REFLEX): HIV SCREEN 4TH GENERATION: NONREACTIVE

## 2017-12-12 MED ORDER — ISOSORBIDE MONONITRATE ER 30 MG PO TB24
30.0000 mg | ORAL_TABLET | Freq: Every day | ORAL | Status: DC
  Administered 2017-12-12 – 2017-12-13 (×2): 30 mg via ORAL
  Filled 2017-12-12 (×2): qty 1

## 2017-12-12 MED ORDER — SODIUM CHLORIDE 0.9 % IV SOLN: 250.0000 mL | INTRAVENOUS | Status: DC | PRN

## 2017-12-12 MED ORDER — SODIUM CHLORIDE 0.9 % IV SOLN
INTRAVENOUS | Status: DC
  Administered 2017-12-13: 06:00:00 via INTRAVENOUS

## 2017-12-12 MED ORDER — SODIUM CHLORIDE 0.9% FLUSH
3.0000 mL | Freq: Two times a day (BID) | INTRAVENOUS | Status: DC
  Administered 2017-12-12 – 2017-12-13 (×2): 3 mL via INTRAVENOUS

## 2017-12-12 MED ORDER — ASPIRIN 81 MG PO CHEW
81.0000 mg | CHEWABLE_TABLET | ORAL | Status: AC
  Administered 2017-12-13: 81 mg via ORAL
  Filled 2017-12-12: qty 1

## 2017-12-12 MED ORDER — SODIUM CHLORIDE 0.9% FLUSH: 3.0000 mL | INTRAVENOUS | Status: DC | PRN

## 2017-12-12 NOTE — Progress Notes (Signed)
Nutrition Brief Note  Patient identified on the Malnutrition Screening Tool (MST) Report  Wt Readings from Last 15 Encounters:  12/12/17 93.1 kg  12/04/17 104.3 kg  07/13/17 102.1 kg  06/22/17 104.3 kg  08/14/15 97.5 kg  11/19/14 94.5 kg   Patient with PMH significant for COPD, active smoker, HTN, GERD presented to ED with shortness of breath for last 1 week, progressively worse over the last 2 days. Found to have acute systolic CHF and mild COPD exacerbation. Pt denies having a loss in appetite PTA, consuming 3-5 meals daily. States he noticed swelling in his abdomen PTA that has since decreased with lasix. He does not think he has lost any dry weight because his clothes are fitting the same. He does not wish to have supplementation. No skin breakdown charted.   Body mass index is 27.84 kg/m. Patient meets criteria for overweight based on current BMI.   Current diet order is Heart Healthy, patient is consuming approximately 100% of meals at this time. Labs and medications reviewed.   No nutrition interventions warranted at this time. If nutrition issues arise, please consult RD.   Vanessa Kick RD, LDN Clinical Nutrition Pager # [phone]

## 2017-12-12 NOTE — Progress Notes (Signed)
PROGRESS NOTE    Allen Mayer  ZOX:[id number] DOB: 22-Dec-1960 DOA: 12/11/2017 PCP: Medicine, [facility]    Brief Narrative:  57 year old male with history of COPD, active smoker, hypertension, GERD presented to ED with shortness of breath for last 1 week, progressively worse over the last 2 days.  Patient states that he has been having intermittent shortness of breath due to COPD accompanied with congestion, wheezing, productive cough with whitish sputum.  He used his albuterol inhaler without significant relief.  However he also noticed that in the 1 week, he was having worsening abdominal distention, shortness of breath worse on lying down.  In the last 2 days he is also having midsternal chest tightness with shortness of breath.  Patient reports he takes some of his blood pressure medications otherwise has not been taking many of his medications.  ED work-up/course:  Temp 97.7, respiratory 20, pulse 88, BP 155/103 CMET showed sodium 139, potassium 4.5, BUN 16, creatinine 1.45 (on 12/04/2017, creatinine was 1.2) WBC 6.6 hemoglobin 15.9 BNP elevated 863.3, troponin elevated 0.06 EKG showed rate 87, normal sinus rhythm, T wave inversions in 2 3 aVF and lateral leads  Assessment & Plan:   Principal Problem:   Acute CHF (congestive heart failure) (HCC) Active Problems:   Tobacco abuse   Essential hypertension, benign   Elevated troponin   COPD (chronic obstructive pulmonary disease) (HCC)    Acute systolic CHF (congestive heart failure) (HCC), chronicity unknown -Risk factors of uncontrolled hypertension, family history, noncompliance -BNP elevated, CT angiogram of the chest negative for PE however showed cardiomegaly with interstitial pulmonary edema, small bilateral pleural effusions suggesting CHF, small amount of ascites likely due to hepatic congestion in the setting of CHF, two-vessel CAD with aortic atherosclerosis -Serial trop stable at 0.4. Patient denies  chest pains -2d echo with EF of 25-30% -not on beta-blocker secondary to acute decompensation, hold ACE inhibitor secondary to renal insufficiency -Cardiology following with plan for heart cath  COPD (chronic obstructive pulmonary disease) (HCC) with mild acute exacerbation -Given tachycardia, will continuer on Xopenex, Atrovent scheduled, beclomethasone twice daily -Pt has been started on  prednisone 40 mg p.o. daily x5 days, doxycycline    Tobacco abuse -Tobacco cessation done at time of admission -Patient has nicotine patch    Essential hypertension, benign -Continue IV Lasix, placed on hydralazine -STable at present    Elevated troponin with abn EKG -Overnight trop stable at around 0.04 -Cardiology following, plans for heart cath  Mild acute kidney injury -Possibly due to cardiorenal syndrome, decreased renal perfusion due to acute CHF -Held HCTZ, lisinopril -Patient continued on Lasix -Repeat bmet in AM  DVT prophylaxis: Heparin subQ Code Status: Full Family Communication: Pt in room, family at bedside Disposition Plan: Uncertain at this time  Consultants:   Cardiology  Procedures:     Antimicrobials: Anti-infectives (From admission, onward)   Start     Dose/Rate Route Frequency Ordered Stop   12/11/17 1600  doxycycline (VIBRA-TABS) tablet 100 mg     100 mg Oral Every 12 hours 12/11/17 1411         Subjective: Wants to go outside  Objective: Vitals:   12/11/17 2103 12/12/17 0500 12/12/17 0515 12/12/17 0755  BP: 139/84  (!) 144/91   Pulse: 91  73   Resp: 20  20   Temp: 97.8 F (36.6 C)     TempSrc: Oral     SpO2: 96%  97% 94%  Weight:  93.1 kg    Height:  Intake/Output Summary (Last 24 hours) at 12/12/2017 1050 Last data filed at 12/12/2017 0943 Gross per 24 hour  Intake 240 ml  Output 5225 ml  Net -4985 ml   Filed Weights   12/11/17 0855 12/12/17 0500  Weight: 104.3 kg 93.1 kg    Examination:  General exam: Appears calm and  comfortable  Respiratory system: Clear to auscultation. Respiratory effort normal. Cardiovascular system: S1 & S2 heard, RRR Gastrointestinal system: Abdomen is nondistended, soft and nontender. No organomegaly or masses felt. Normal bowel sounds heard. Central nervous system: Alert and oriented. No focal neurological deficits. Extremities: Symmetric 5 x 5 power. Skin: No rashes, lesions Psychiatry: Judgement and insight appear normal. Mood & affect appropriate.   Data Reviewed: I have personally reviewed following labs and imaging studies  CBC: Recent Labs  Lab 12/11/17 0914  WBC 6.6  NEUTROABS 4.0  HGB 15.9  HCT 47.4  MCV 91.3  PLT 231   Basic Metabolic Panel: Recent Labs  Lab 12/11/17 0914 12/12/17 0157  NA 139 141  K 4.5 4.3  CL 105 102  CO2 22 27  GLUCOSE 118* 146*  BUN 16 20  CREATININE 1.45* 1.31*  CALCIUM 8.9 9.3   GFR: Estimated Creatinine Clearance: 67.5 mL/min (A) (by C-G formula based on SCr of 1.31 mg/dL (H)). Liver Function Tests: Recent Labs  Lab 12/11/17 0914  AST 80*  ALT 128*  ALKPHOS 56  BILITOT 1.0  PROT 6.6  ALBUMIN 3.8   Recent Labs  Lab 12/11/17 0914  LIPASE 34   No results for input(s): AMMONIA in the last 168 hours. Coagulation Profile: No results for input(s): INR, PROTIME in the last 168 hours. Cardiac Enzymes: Recent Labs  Lab 12/11/17 0914 12/11/17 1450 12/11/17 2029 12/12/17 0157  TROPONINI 0.06* 0.04* 0.04* 0.04*   BNP (last 3 results) No results for input(s): PROBNP in the last 8760 hours. HbA1C: No results for input(s): HGBA1C in the last 72 hours. CBG: No results for input(s): GLUCAP in the last 168 hours. Lipid Profile: No results for input(s): CHOL, HDL, LDLCALC, TRIG, CHOLHDL, LDLDIRECT in the last 72 hours. Thyroid Function Tests: No results for input(s): TSH, T4TOTAL, FREET4, T3FREE, THYROIDAB in the last 72 hours. Anemia Panel: No results for input(s): VITAMINB12, FOLATE, FERRITIN, TIBC, IRON,  RETICCTPCT in the last 72 hours. Sepsis Labs: No results for input(s): PROCALCITON, LATICACIDVEN in the last 168 hours.  No results found for this or any previous visit (from the past 240 hour(s)).   Radiology Studies: Dg Chest 2 View  Result Date: 12/11/2017 CLINICAL DATA:  57 year old male with shortness of breath for the past month which is worse over the past 2 days accompanied by chest pain and productive cough EXAM: CHEST - 2 VIEW COMPARISON:  Prior chest x-ray 12/04/2017 FINDINGS: Borderline cardiomegaly. Increased pulmonary vascular congestion and diffuse interstitial prominence. Interval development of a small right-sided pleural effusion. Diffuse bronchial wall thickening and peribronchial cuffing. Nonspecific patchy airspace opacities in both lung bases have a linear appearance most suggestive of atelectasis. No acute osseous abnormality. IMPRESSION: 1. Interval development of a small right-sided pleural effusion and associated right basilar atelectasis. 2. Slightly increased pulmonary vascular congestion and diffuse interstitial prominence. Differential considerations include developing mild interstitial edema versus atypical or viral respiratory infection. 3. Stable borderline cardiomegaly. 4. Persistent advanced bronchitic changes consistent with the clinical history of COPD. Acute on chronic bronchitis is difficult to exclude radiographically. Electronically Signed   By: Malachy Moan M.D.   On: 12/11/2017 09:52  Ct Angio Chest Pe W And/or Wo Contrast  Result Date: 12/11/2017 CLINICAL DATA:  57 year old male with history of generalized abdominal pain. Mild fever. Shortness of breath for 1 month. Chest pain. EXAM: CT ANGIOGRAPHY CHEST CT ABDOMEN AND PELVIS WITH CONTRAST TECHNIQUE: Multidetector CT imaging of the chest was performed using the standard protocol during bolus administration of intravenous contrast. Multiplanar CT image reconstructions and MIPs were obtained to evaluate the  vascular anatomy. Multidetector CT imaging of the abdomen and pelvis was performed using the standard protocol during bolus administration of intravenous contrast. CONTRAST:  ISOVUE-370 IOPAMIDOL (ISOVUE-370) INJECTION 76% COMPARISON:  CT the abdomen and pelvis 01/31/2012. No prior chest CT. FINDINGS: Comment: Study is limited for assessment of pulmonary embolism due to excessive patient respiratory motion. CTA CHEST FINDINGS Cardiovascular: No central, lobar or proximal segmental sized filling defects to suggest pulmonary embolism. Larger segmental and subsegmental size filling defects cannot be entirely excluded secondary to excessive patient respiratory motion. Heart size is mildly enlarged. There is no significant pericardial fluid, thickening or pericardial calcification. There is aortic atherosclerosis, as well as atherosclerosis of the great vessels of the mediastinum and the coronary arteries, including calcified atherosclerotic plaque in the left anterior descending and left circumflex coronary arteries. Mediastinum/Nodes: No pathologically enlarged mediastinal or hilar lymph nodes. Esophagus is unremarkable in appearance. No axillary lymphadenopathy. Lungs/Pleura: Small bilateral pleural effusions lying dependently. Patchy areas of mild ground-glass attenuation and widespread mild interlobular septal thickening, suggesting a background of mild interstitial pulmonary edema. No confluent consolidative airspace disease. No suspicious appearing pulmonary nodules or masses are noted. Musculoskeletal: There are no aggressive appearing lytic or blastic lesions noted in the visualized portions of the skeleton. Review of the MIP images confirms the above findings. CT ABDOMEN and PELVIS FINDINGS Hepatobiliary: No suspicious cystic or solid hepatic lesions. No intra or extrahepatic biliary ductal dilatation. No calcified gallstones. Gallbladder wall appears edematous. Gallbladder does not appear overly  distended. No pericholecystic fluid. Pancreas: No pancreatic mass. No pancreatic ductal dilatation. No pancreatic or peripancreatic fluid or inflammatory changes. Spleen: Unremarkable. Adrenals/Urinary Tract: Bilateral kidneys and bilateral adrenal glands are normal in appearance. No hydroureteronephrosis. Urinary bladder is normal in appearance. Stomach/Bowel: Normal appearance of the stomach. No pathologic dilatation of small bowel or colon. Normal appendix. Vascular/Lymphatic: Aortic atherosclerosis, without evidence of aneurysm or dissection in the abdominal or pelvic vasculature. No lymphadenopathy noted in the abdomen or pelvis. Reproductive: Prostate gland and seminal vesicles are unremarkable in appearance. Other: Some fluid is noted tracking along the lower aspect of the right side of the retroperitoneum and along the right pelvic sidewall. There is also trace volume of ascites. No pneumoperitoneum. Musculoskeletal: There are no aggressive appearing lytic or blastic lesions noted in the visualized portions of the skeleton. Review of the MIP images confirms the above findings. IMPRESSION: 1. Limited study, which demonstrates no evidence of central, lobar or proximal segmental sized pulmonary embolism. Smaller distal segmental and subsegmental sized emboli cannot be entirely excluded secondary to respiratory motion. 2. Cardiomegaly with evidence of interstitial pulmonary edema and small bilateral pleural effusions suggesting congestive heart failure. 3. Small amount of ascites. Small volume of fluid tracking along the right retroperitoneum into the right side of the pelvis. Gallbladder wall edema. These findings are all favored to be related to underlying hepatic congestion in the setting of congestive heart failure. There are no calcified gallstones. Additionally, the gallbladder does not appear overly distended, and is not surrounded by overt inflammatory changes to suggest a cholecystitis at  this time. 4.  Aortic atherosclerosis, in addition to 2 vessel coronary artery disease. Please note that although the presence of coronary artery calcium documents the presence of coronary artery disease, the severity of this disease and any potential stenosis cannot be assessed on this non-gated CT examination. Assessment for potential risk factor modification, dietary therapy or pharmacologic therapy may be warranted, if clinically indicated. Electronically Signed   By: Trudie Reed M.D.   On: 12/11/2017 12:00   Ct Abdomen Pelvis W Contrast  Result Date: 12/11/2017 CLINICAL DATA:  57 year old male with history of generalized abdominal pain. Mild fever. Shortness of breath for 1 month. Chest pain. EXAM: CT ANGIOGRAPHY CHEST CT ABDOMEN AND PELVIS WITH CONTRAST TECHNIQUE: Multidetector CT imaging of the chest was performed using the standard protocol during bolus administration of intravenous contrast. Multiplanar CT image reconstructions and MIPs were obtained to evaluate the vascular anatomy. Multidetector CT imaging of the abdomen and pelvis was performed using the standard protocol during bolus administration of intravenous contrast. CONTRAST:  ISOVUE-370 IOPAMIDOL (ISOVUE-370) INJECTION 76% COMPARISON:  CT the abdomen and pelvis 01/31/2012. No prior chest CT. FINDINGS: Comment: Study is limited for assessment of pulmonary embolism due to excessive patient respiratory motion. CTA CHEST FINDINGS Cardiovascular: No central, lobar or proximal segmental sized filling defects to suggest pulmonary embolism. Larger segmental and subsegmental size filling defects cannot be entirely excluded secondary to excessive patient respiratory motion. Heart size is mildly enlarged. There is no significant pericardial fluid, thickening or pericardial calcification. There is aortic atherosclerosis, as well as atherosclerosis of the great vessels of the mediastinum and the coronary arteries, including calcified atherosclerotic plaque in  the left anterior descending and left circumflex coronary arteries. Mediastinum/Nodes: No pathologically enlarged mediastinal or hilar lymph nodes. Esophagus is unremarkable in appearance. No axillary lymphadenopathy. Lungs/Pleura: Small bilateral pleural effusions lying dependently. Patchy areas of mild ground-glass attenuation and widespread mild interlobular septal thickening, suggesting a background of mild interstitial pulmonary edema. No confluent consolidative airspace disease. No suspicious appearing pulmonary nodules or masses are noted. Musculoskeletal: There are no aggressive appearing lytic or blastic lesions noted in the visualized portions of the skeleton. Review of the MIP images confirms the above findings. CT ABDOMEN and PELVIS FINDINGS Hepatobiliary: No suspicious cystic or solid hepatic lesions. No intra or extrahepatic biliary ductal dilatation. No calcified gallstones. Gallbladder wall appears edematous. Gallbladder does not appear overly distended. No pericholecystic fluid. Pancreas: No pancreatic mass. No pancreatic ductal dilatation. No pancreatic or peripancreatic fluid or inflammatory changes. Spleen: Unremarkable. Adrenals/Urinary Tract: Bilateral kidneys and bilateral adrenal glands are normal in appearance. No hydroureteronephrosis. Urinary bladder is normal in appearance. Stomach/Bowel: Normal appearance of the stomach. No pathologic dilatation of small bowel or colon. Normal appendix. Vascular/Lymphatic: Aortic atherosclerosis, without evidence of aneurysm or dissection in the abdominal or pelvic vasculature. No lymphadenopathy noted in the abdomen or pelvis. Reproductive: Prostate gland and seminal vesicles are unremarkable in appearance. Other: Some fluid is noted tracking along the lower aspect of the right side of the retroperitoneum and along the right pelvic sidewall. There is also trace volume of ascites. No pneumoperitoneum. Musculoskeletal: There are no aggressive appearing  lytic or blastic lesions noted in the visualized portions of the skeleton. Review of the MIP images confirms the above findings. IMPRESSION: 1. Limited study, which demonstrates no evidence of central, lobar or proximal segmental sized pulmonary embolism. Smaller distal segmental and subsegmental sized emboli cannot be entirely excluded secondary to respiratory motion. 2. Cardiomegaly with evidence of interstitial pulmonary edema  and small bilateral pleural effusions suggesting congestive heart failure. 3. Small amount of ascites. Small volume of fluid tracking along the right retroperitoneum into the right side of the pelvis. Gallbladder wall edema. These findings are all favored to be related to underlying hepatic congestion in the setting of congestive heart failure. There are no calcified gallstones. Additionally, the gallbladder does not appear overly distended, and is not surrounded by overt inflammatory changes to suggest a cholecystitis at this time. 4. Aortic atherosclerosis, in addition to 2 vessel coronary artery disease. Please note that although the presence of coronary artery calcium documents the presence of coronary artery disease, the severity of this disease and any potential stenosis cannot be assessed on this non-gated CT examination. Assessment for potential risk factor modification, dietary therapy or pharmacologic therapy may be warranted, if clinically indicated. Electronically Signed   By: Trudie Reed M.D.   On: 12/11/2017 12:00    Scheduled Meds: . aspirin EC  81 mg Oral Daily  . budesonide  0.25 mg Nebulization BID  . doxycycline  100 mg Oral Q12H  . feeding supplement (ENSURE ENLIVE)  237 mL Oral BID BM  . gabapentin  400 mg Oral QHS  . heparin  5,000 Units Subcutaneous Q8H  . hydrALAZINE  10 mg Oral Q8H  . levalbuterol  0.63 mg Nebulization BID   And  . ipratropium  0.5 mg Nebulization BID  . nicotine  21 mg Transdermal Daily  . pantoprazole  40 mg Oral Daily  .  pneumococcal 23 valent vaccine  0.5 mL Intramuscular Tomorrow-1000  . predniSONE  40 mg Oral Q breakfast  . sodium chloride flush  3 mL Intravenous Q12H   Continuous Infusions: . sodium chloride       LOS: 1 day   Rickey Barbara, MD [facility] Pager On Amion  If 7PM-7AM, please contact night-coverage 12/12/2017, 10:50 AM

## 2017-12-12 NOTE — Progress Notes (Signed)
RN and NT unable to find pt on unit. Earlier in shift, pt asked MD if allowed to go off unit and was told verbally he is not allowed off unit. Pt found coming off elevator once staff started looking for him and wife. Informed pt that he is only allowed to walk in halls and is not allowed off unit. Pt verbalized understanding.

## 2017-12-12 NOTE — Care Management Note (Signed)
Case Management Note  Patient Details  Name: Allen Mayer MRN: [id number] Date of Birth: July 15, 1960  Subjective/Objective:  Pt admitted with acute CHF.                   Action/Plan: Plan to follow up at [facility], where he also gets his medications.    Expected Discharge Date:  12/12/17               Expected Discharge Plan:  Home/Self Care  In-House Referral:  NA  Discharge planning Services  CM Consult  Post Acute Care Choice:  NA Choice offered to:     DME Arranged:    DME Agency:     HH Arranged:    HH Agency:     Status of Service:  Completed, signed off  If discussed at [employer] of Stay Meetings, dates discussed:    Additional CommentsGeni Bers, RN 12/12/2017, 12:52 PM

## 2017-12-12 NOTE — Progress Notes (Signed)
Pt adamantly refusing 0600 lasix despite RN encouragement and education on importance of taking medicine. Pt irritable and refusing teaching. Pt still c/o cramping. PRN pain medication administered as ordered. Will continue to monitor.

## 2017-12-12 NOTE — Progress Notes (Signed)
Progress Note  Patient Name: Allen Mayer Date of Encounter: 12/12/2017  Primary Cardiologist: Rollene Rotunda, MD   Subjective   No chest pain, dyspnea improved  Inpatient Medications    Scheduled Meds: . aspirin EC  81 mg Oral Daily  . budesonide  0.25 mg Nebulization BID  . doxycycline  100 mg Oral Q12H  . feeding supplement (ENSURE ENLIVE)  237 mL Oral BID BM  . furosemide  40 mg Intravenous Q12H  . gabapentin  400 mg Oral QHS  . heparin  5,000 Units Subcutaneous Q8H  . hydrALAZINE  10 mg Oral Q8H  . levalbuterol  0.63 mg Nebulization BID   And  . ipratropium  0.5 mg Nebulization BID  . nicotine  21 mg Transdermal Daily  . pantoprazole  40 mg Oral Daily  . pneumococcal 23 valent vaccine  0.5 mL Intramuscular Tomorrow-1000  . predniSONE  40 mg Oral Q breakfast  . sodium chloride flush  3 mL Intravenous Q12H   Continuous Infusions: . sodium chloride     PRN Meds: sodium chloride, acetaminophen, hydrALAZINE, levalbuterol, morphine injection, nitroGLYCERIN, ondansetron (ZOFRAN) IV, simethicone, sodium chloride flush   Vital Signs    Vitals:   12/11/17 2103 12/12/17 0500 12/12/17 0515 12/12/17 0755  BP: 139/84  (!) 144/91   Pulse: 91  73   Resp: 20  20   Temp: 97.8 F (36.6 C)     TempSrc: Oral     SpO2: 96%  97% 94%  Weight:  93.1 kg    Height:        Intake/Output Summary (Last 24 hours) at 12/12/2017 0917 Last data filed at 12/12/2017 0739 Gross per 24 hour  Intake 240 ml  Output 4875 ml  Net -4635 ml   Filed Weights   12/11/17 0855 12/12/17 0500  Weight: 104.3 kg 93.1 kg    Telemetry    SR with PVCs some couplets - Personally Reviewed  ECG    No new - Personally Reviewed  Physical Exam   GEN: No acute distress.   Neck: No JVD Cardiac: RRR, no murmurs, rubs, or gallops.  Respiratory: Clear to auscultation bilaterally. GI: Soft, nontender, non-distended  MS: No edema; No deformity. Neuro:  Nonfocal  Psych: Normal affect   Labs      Chemistry Recent Labs  Lab 12/11/17 0914 12/12/17 0157  NA 139 141  K 4.5 4.3  CL 105 102  CO2 22 27  GLUCOSE 118* 146*  BUN 16 20  CREATININE 1.45* 1.31*  CALCIUM 8.9 9.3  PROT 6.6  --   ALBUMIN 3.8  --   AST 80*  --   ALT 128*  --   ALKPHOS 56  --   BILITOT 1.0  --   GFRNONAA 52* 58*  GFRAA 60* >60  ANIONGAP 12 12     Hematology Recent Labs  Lab 12/11/17 0914  WBC 6.6  RBC 5.19  HGB 15.9  HCT 47.4  MCV 91.3  MCH 30.6  MCHC 33.5  RDW 14.4  PLT 231    Cardiac Enzymes Recent Labs  Lab 12/11/17 0914 12/11/17 1450 12/11/17 2029 12/12/17 0157  TROPONINI 0.06* 0.04* 0.04* 0.04*   No results for input(s): TROPIPOC in the last 168 hours.   BNP Recent Labs  Lab 12/11/17 0914  BNP 863.3*     DDimer No results for input(s): DDIMER in the last 168 hours.   Radiology    Dg Chest 2 View  Result Date: 12/11/2017 CLINICAL DATA:  57 year old male with shortness of breath for the past month which is worse over the past 2 days accompanied by chest pain and productive cough EXAM: CHEST - 2 VIEW COMPARISON:  Prior chest x-ray 12/04/2017 FINDINGS: Borderline cardiomegaly. Increased pulmonary vascular congestion and diffuse interstitial prominence. Interval development of a small right-sided pleural effusion. Diffuse bronchial wall thickening and peribronchial cuffing. Nonspecific patchy airspace opacities in both lung bases have a linear appearance most suggestive of atelectasis. No acute osseous abnormality. IMPRESSION: 1. Interval development of a small right-sided pleural effusion and associated right basilar atelectasis. 2. Slightly increased pulmonary vascular congestion and diffuse interstitial prominence. Differential considerations include developing mild interstitial edema versus atypical or viral respiratory infection. 3. Stable borderline cardiomegaly. 4. Persistent advanced bronchitic changes consistent with the clinical history of COPD. Acute on chronic  bronchitis is difficult to exclude radiographically. Electronically Signed   By: Malachy Moan M.D.   On: 12/11/2017 09:52   Ct Angio Chest Pe W And/or Wo Contrast  Result Date: 12/11/2017 CLINICAL DATA:  57 year old male with history of generalized abdominal pain. Mild fever. Shortness of breath for 1 month. Chest pain. EXAM: CT ANGIOGRAPHY CHEST CT ABDOMEN AND PELVIS WITH CONTRAST TECHNIQUE: Multidetector CT imaging of the chest was performed using the standard protocol during bolus administration of intravenous contrast. Multiplanar CT image reconstructions and MIPs were obtained to evaluate the vascular anatomy. Multidetector CT imaging of the abdomen and pelvis was performed using the standard protocol during bolus administration of intravenous contrast. CONTRAST:  ISOVUE-370 IOPAMIDOL (ISOVUE-370) INJECTION 76% COMPARISON:  CT the abdomen and pelvis 01/31/2012. No prior chest CT. FINDINGS: Comment: Study is limited for assessment of pulmonary embolism due to excessive patient respiratory motion. CTA CHEST FINDINGS Cardiovascular: No central, lobar or proximal segmental sized filling defects to suggest pulmonary embolism. Larger segmental and subsegmental size filling defects cannot be entirely excluded secondary to excessive patient respiratory motion. Heart size is mildly enlarged. There is no significant pericardial fluid, thickening or pericardial calcification. There is aortic atherosclerosis, as well as atherosclerosis of the great vessels of the mediastinum and the coronary arteries, including calcified atherosclerotic plaque in the left anterior descending and left circumflex coronary arteries. Mediastinum/Nodes: No pathologically enlarged mediastinal or hilar lymph nodes. Esophagus is unremarkable in appearance. No axillary lymphadenopathy. Lungs/Pleura: Small bilateral pleural effusions lying dependently. Patchy areas of mild ground-glass attenuation and widespread mild interlobular  septal thickening, suggesting a background of mild interstitial pulmonary edema. No confluent consolidative airspace disease. No suspicious appearing pulmonary nodules or masses are noted. Musculoskeletal: There are no aggressive appearing lytic or blastic lesions noted in the visualized portions of the skeleton. Review of the MIP images confirms the above findings. CT ABDOMEN and PELVIS FINDINGS Hepatobiliary: No suspicious cystic or solid hepatic lesions. No intra or extrahepatic biliary ductal dilatation. No calcified gallstones. Gallbladder wall appears edematous. Gallbladder does not appear overly distended. No pericholecystic fluid. Pancreas: No pancreatic mass. No pancreatic ductal dilatation. No pancreatic or peripancreatic fluid or inflammatory changes. Spleen: Unremarkable. Adrenals/Urinary Tract: Bilateral kidneys and bilateral adrenal glands are normal in appearance. No hydroureteronephrosis. Urinary bladder is normal in appearance. Stomach/Bowel: Normal appearance of the stomach. No pathologic dilatation of small bowel or colon. Normal appendix. Vascular/Lymphatic: Aortic atherosclerosis, without evidence of aneurysm or dissection in the abdominal or pelvic vasculature. No lymphadenopathy noted in the abdomen or pelvis. Reproductive: Prostate gland and seminal vesicles are unremarkable in appearance. Other: Some fluid is noted tracking along the lower aspect of the right side of the  retroperitoneum and along the right pelvic sidewall. There is also trace volume of ascites. No pneumoperitoneum. Musculoskeletal: There are no aggressive appearing lytic or blastic lesions noted in the visualized portions of the skeleton. Review of the MIP images confirms the above findings. IMPRESSION: 1. Limited study, which demonstrates no evidence of central, lobar or proximal segmental sized pulmonary embolism. Smaller distal segmental and subsegmental sized emboli cannot be entirely excluded secondary to respiratory  motion. 2. Cardiomegaly with evidence of interstitial pulmonary edema and small bilateral pleural effusions suggesting congestive heart failure. 3. Small amount of ascites. Small volume of fluid tracking along the right retroperitoneum into the right side of the pelvis. Gallbladder wall edema. These findings are all favored to be related to underlying hepatic congestion in the setting of congestive heart failure. There are no calcified gallstones. Additionally, the gallbladder does not appear overly distended, and is not surrounded by overt inflammatory changes to suggest a cholecystitis at this time. 4. Aortic atherosclerosis, in addition to 2 vessel coronary artery disease. Please note that although the presence of coronary artery calcium documents the presence of coronary artery disease, the severity of this disease and any potential stenosis cannot be assessed on this non-gated CT examination. Assessment for potential risk factor modification, dietary therapy or pharmacologic therapy may be warranted, if clinically indicated. Electronically Signed   By: Trudie Reed M.D.   On: 12/11/2017 12:00   Ct Abdomen Pelvis W Contrast  Result Date: 12/11/2017 CLINICAL DATA:  57 year old male with history of generalized abdominal pain. Mild fever. Shortness of breath for 1 month. Chest pain. EXAM: CT ANGIOGRAPHY CHEST CT ABDOMEN AND PELVIS WITH CONTRAST TECHNIQUE: Multidetector CT imaging of the chest was performed using the standard protocol during bolus administration of intravenous contrast. Multiplanar CT image reconstructions and MIPs were obtained to evaluate the vascular anatomy. Multidetector CT imaging of the abdomen and pelvis was performed using the standard protocol during bolus administration of intravenous contrast. CONTRAST:  ISOVUE-370 IOPAMIDOL (ISOVUE-370) INJECTION 76% COMPARISON:  CT the abdomen and pelvis 01/31/2012. No prior chest CT. FINDINGS: Comment: Study is limited for assessment of  pulmonary embolism due to excessive patient respiratory motion. CTA CHEST FINDINGS Cardiovascular: No central, lobar or proximal segmental sized filling defects to suggest pulmonary embolism. Larger segmental and subsegmental size filling defects cannot be entirely excluded secondary to excessive patient respiratory motion. Heart size is mildly enlarged. There is no significant pericardial fluid, thickening or pericardial calcification. There is aortic atherosclerosis, as well as atherosclerosis of the great vessels of the mediastinum and the coronary arteries, including calcified atherosclerotic plaque in the left anterior descending and left circumflex coronary arteries. Mediastinum/Nodes: No pathologically enlarged mediastinal or hilar lymph nodes. Esophagus is unremarkable in appearance. No axillary lymphadenopathy. Lungs/Pleura: Small bilateral pleural effusions lying dependently. Patchy areas of mild ground-glass attenuation and widespread mild interlobular septal thickening, suggesting a background of mild interstitial pulmonary edema. No confluent consolidative airspace disease. No suspicious appearing pulmonary nodules or masses are noted. Musculoskeletal: There are no aggressive appearing lytic or blastic lesions noted in the visualized portions of the skeleton. Review of the MIP images confirms the above findings. CT ABDOMEN and PELVIS FINDINGS Hepatobiliary: No suspicious cystic or solid hepatic lesions. No intra or extrahepatic biliary ductal dilatation. No calcified gallstones. Gallbladder wall appears edematous. Gallbladder does not appear overly distended. No pericholecystic fluid. Pancreas: No pancreatic mass. No pancreatic ductal dilatation. No pancreatic or peripancreatic fluid or inflammatory changes. Spleen: Unremarkable. Adrenals/Urinary Tract: Bilateral kidneys and bilateral adrenal glands  are normal in appearance. No hydroureteronephrosis. Urinary bladder is normal in appearance.  Stomach/Bowel: Normal appearance of the stomach. No pathologic dilatation of small bowel or colon. Normal appendix. Vascular/Lymphatic: Aortic atherosclerosis, without evidence of aneurysm or dissection in the abdominal or pelvic vasculature. No lymphadenopathy noted in the abdomen or pelvis. Reproductive: Prostate gland and seminal vesicles are unremarkable in appearance. Other: Some fluid is noted tracking along the lower aspect of the right side of the retroperitoneum and along the right pelvic sidewall. There is also trace volume of ascites. No pneumoperitoneum. Musculoskeletal: There are no aggressive appearing lytic or blastic lesions noted in the visualized portions of the skeleton. Review of the MIP images confirms the above findings. IMPRESSION: 1. Limited study, which demonstrates no evidence of central, lobar or proximal segmental sized pulmonary embolism. Smaller distal segmental and subsegmental sized emboli cannot be entirely excluded secondary to respiratory motion. 2. Cardiomegaly with evidence of interstitial pulmonary edema and small bilateral pleural effusions suggesting congestive heart failure. 3. Small amount of ascites. Small volume of fluid tracking along the right retroperitoneum into the right side of the pelvis. Gallbladder wall edema. These findings are all favored to be related to underlying hepatic congestion in the setting of congestive heart failure. There are no calcified gallstones. Additionally, the gallbladder does not appear overly distended, and is not surrounded by overt inflammatory changes to suggest a cholecystitis at this time. 4. Aortic atherosclerosis, in addition to 2 vessel coronary artery disease. Please note that although the presence of coronary artery calcium documents the presence of coronary artery disease, the severity of this disease and any potential stenosis cannot be assessed on this non-gated CT examination. Assessment for potential risk factor modification,  dietary therapy or pharmacologic therapy may be warranted, if clinically indicated. Electronically Signed   By: Trudie Reed M.D.   On: 12/11/2017 12:00    Cardiac Studies   Echo 12/11/17  Study Conclusions  - Left ventricle: The cavity size was mildly dilated. Wall   thickness was normal. Systolic function was severely reduced. The   estimated ejection fraction was in the range of 25% to 30%.   Diffuse hypokinesis. Doppler parameters are consistent with a   reversible restrictive pattern, indicative of decreased left   ventricular diastolic compliance and/or increased left atrial   pressure (grade 3 diastolic dysfunction). - Right ventricle: Systolic function was mildly to moderately   reduced. - Right atrium: The atrium was mildly to moderately dilated.   Patient Profile     57 y.o. male with a hx of palpitations but no other cardiac history other than coronary calcium noted on previous chest imaging and admitted for SOB.  Has presented before with SOB and treated for SOB.  + cocaine, elevated BNP and troponin 0.06.   Assessment & Plan    Acute dyspnea Echo pending, discussed low salt and needs BP control.  Currently pt refusing diuresis.   BNP- 863  Lasix 40 IV BID EF 25-30%  G3 DD   Coronary calcium, troponin 0.06; 0.04; 0.04; 0.04 flat. CK is 599 no MB.   Flat troponins  --now with drop in EF may be LVH but will proceed with cath-  The schedule is full today tomorrow at 4 is the earliest.  In meantime continue hydralazine, with elevated Cr will hold ACE/ARB for now and with cocaine BB is difficult.  Currently at 10 mg every 8 hours defer to Dr. Mayford Knife.     HTN --today 144/91 --increase hydralazine   Substance  abuse.  Needs education, instructed on importance to stop.  Tobacco and cocaine.   Abnormal EKG with T wave inversions but have been present since March 2019  Though 2017 changes were not present.       For questions or updates, please contact [facility] Please consult [email or web address] for contact info under Cardiology/STEMI.      Signed, Nada Boozer, NP  12/12/2017, 9:17 AM

## 2017-12-13 ENCOUNTER — Encounter (HOSPITAL_COMMUNITY)
Admission: EM | Discharge status: Against Medical Advice | Payer: Self-pay | Source: Home / Self Care | Attending: Internal Medicine

## 2017-12-13 DIAGNOSIS — I5022 Chronic systolic (congestive) heart failure: Secondary | ICD-10-CM

## 2017-12-13 DIAGNOSIS — I5041 Acute combined systolic (congestive) and diastolic (congestive) heart failure: Secondary | ICD-10-CM

## 2017-12-13 DIAGNOSIS — F1010 Alcohol abuse, uncomplicated: Secondary | ICD-10-CM

## 2017-12-13 DIAGNOSIS — Z720 Tobacco use: Secondary | ICD-10-CM

## 2017-12-13 DIAGNOSIS — J431 Panlobular emphysema: Secondary | ICD-10-CM

## 2017-12-13 HISTORY — PX: RIGHT/LEFT HEART CATH AND CORONARY ANGIOGRAPHY: CATH118266

## 2017-12-13 LAB — CBC
HEMATOCRIT: 48.7 % (ref 39.0–52.0)
Hemoglobin: 16 g/dL (ref 13.0–17.0)
MCH: 30.1 pg (ref 26.0–34.0)
MCHC: 32.9 g/dL (ref 30.0–36.0)
MCV: 91.5 fL (ref 78.0–100.0)
Platelets: 315 10*3/uL (ref 150–400)
RBC: 5.32 MIL/uL (ref 4.22–5.81)
RDW: 14.5 % (ref 11.5–15.5)
WBC: 12.9 10*3/uL — AB (ref 4.0–10.5)

## 2017-12-13 LAB — POCT I-STAT 3, ART BLOOD GAS (G3+)
ACID-BASE DEFICIT: 1 mmol/L (ref 0.0–2.0)
Bicarbonate: 23.7 mmol/L (ref 20.0–28.0)
O2 SAT: 96 %
PO2 ART: 85 mmHg (ref 83.0–108.0)
TCO2: 25 mmol/L (ref 22–32)
pCO2 arterial: 38.6 mmHg (ref 32.0–48.0)
pH, Arterial: 7.396 (ref 7.350–7.450)

## 2017-12-13 LAB — POCT I-STAT 3, VENOUS BLOOD GAS (G3P V)
BICARBONATE: 26.6 mmol/L (ref 20.0–28.0)
O2 Saturation: 71 %
PH VEN: 7.366 (ref 7.250–7.430)
PO2 VEN: 39 mmHg (ref 32.0–45.0)
TCO2: 28 mmol/L (ref 22–32)
pCO2, Ven: 46.4 mmHg (ref 44.0–60.0)

## 2017-12-13 LAB — BASIC METABOLIC PANEL
Anion gap: 9 (ref 5–15)
BUN: 24 mg/dL — AB (ref 6–20)
CO2: 28 mmol/L (ref 22–32)
CREATININE: 1.12 mg/dL (ref 0.61–1.24)
Calcium: 9 mg/dL (ref 8.9–10.3)
Chloride: 104 mmol/L (ref 98–111)
GFR calc Af Amer: >60 mL/min (ref >60)
GFR calc non Af Amer: >60 mL/min (ref >60)
Glucose, Bld: 132 mg/dL — ABNORMAL HIGH (ref 70–99)
POTASSIUM: 4.1 mmol/L (ref 3.5–5.1)
SODIUM: 141 mmol/L (ref 135–145)

## 2017-12-13 LAB — PROTIME-INR
INR: 0.96
Prothrombin Time: 12.7 seconds (ref 11.4–15.2)

## 2017-12-13 SURGERY — RIGHT/LEFT HEART CATH AND CORONARY ANGIOGRAPHY
Anesthesia: LOCAL

## 2017-12-13 MED ORDER — ASPIRIN 81 MG PO CHEW: 81.0000 mg | CHEWABLE_TABLET | Freq: Every day | ORAL | Status: DC

## 2017-12-13 MED ORDER — LISINOPRIL-HYDROCHLOROTHIAZIDE 20-25 MG PO TABS: 1.0000 | ORAL_TABLET | Freq: Every day | ORAL | Status: DC

## 2017-12-13 MED ORDER — SODIUM CHLORIDE 0.9 % IV SOLN: INTRAVENOUS | Status: DC

## 2017-12-13 MED ORDER — ACETAMINOPHEN 325 MG PO TABS: 650.0000 mg | ORAL_TABLET | ORAL | Status: DC | PRN

## 2017-12-13 MED ORDER — CITALOPRAM HYDROBROMIDE 20 MG PO TABS: 20.0000 mg | ORAL_TABLET | Freq: Every day | ORAL | Status: DC

## 2017-12-13 MED ORDER — MIDAZOLAM HCL 2 MG/2ML IJ SOLN
INTRAMUSCULAR | Status: AC
  Filled 2017-12-13: qty 2

## 2017-12-13 MED ORDER — LISINOPRIL 20 MG PO TABS: 20.0000 mg | ORAL_TABLET | Freq: Every day | ORAL | Status: DC

## 2017-12-13 MED ORDER — ALBUTEROL SULFATE HFA 108 (90 BASE) MCG/ACT IN AERS: 2.0000 | INHALATION_SPRAY | RESPIRATORY_TRACT | Status: DC | PRN

## 2017-12-13 MED ORDER — FENTANYL CITRATE (PF) 100 MCG/2ML IJ SOLN
INTRAMUSCULAR | Status: AC
  Filled 2017-12-13: qty 2

## 2017-12-13 MED ORDER — FENTANYL CITRATE (PF) 100 MCG/2ML IJ SOLN
INTRAMUSCULAR | Status: DC | PRN
  Administered 2017-12-13: 25 ug via INTRAVENOUS

## 2017-12-13 MED ORDER — BENZTROPINE MESYLATE 1 MG PO TABS: 1.0000 mg | ORAL_TABLET | Freq: Every day | ORAL | Status: DC

## 2017-12-13 MED ORDER — LIDOCAINE HCL (PF) 1 % IJ SOLN
INTRAMUSCULAR | Status: AC
  Filled 2017-12-13: qty 30

## 2017-12-13 MED ORDER — HEPARIN (PORCINE) IN NACL 1000-0.9 UT/500ML-% IV SOLN
INTRAVENOUS | Status: AC
  Filled 2017-12-13: qty 1000

## 2017-12-13 MED ORDER — COLCHICINE 0.6 MG PO TABS: 0.6000 mg | ORAL_TABLET | Freq: Every day | ORAL | Status: DC

## 2017-12-13 MED ORDER — BENZTROPINE MESYLATE 1 MG PO TABS
1.0000 mg | ORAL_TABLET | Freq: Every day | ORAL | Status: DC
  Filled 2017-12-13: qty 1

## 2017-12-13 MED ORDER — LIDOCAINE HCL (PF) 1 % IJ SOLN
INTRAMUSCULAR | Status: DC | PRN
  Administered 2017-12-13: 30 mL

## 2017-12-13 MED ORDER — ONDANSETRON HCL 4 MG/2ML IJ SOLN: 4.0000 mg | Freq: Four times a day (QID) | INTRAMUSCULAR | Status: DC | PRN

## 2017-12-13 MED ORDER — MIDAZOLAM HCL 2 MG/2ML IJ SOLN
INTRAMUSCULAR | Status: DC | PRN
  Administered 2017-12-13: 1 mg via INTRAVENOUS

## 2017-12-13 MED ORDER — PALIPERIDONE ER 6 MG PO TB24
6.0000 mg | ORAL_TABLET | Freq: Every day | ORAL | Status: DC
  Administered 2017-12-13: 6 mg via ORAL
  Filled 2017-12-13: qty 1

## 2017-12-13 MED ORDER — FUROSEMIDE 10 MG/ML IJ SOLN
40.0000 mg | Freq: Two times a day (BID) | INTRAMUSCULAR | Status: DC
  Administered 2017-12-13: 40 mg via INTRAVENOUS
  Filled 2017-12-13: qty 4

## 2017-12-13 MED ORDER — MORPHINE SULFATE (PF) 2 MG/ML IV SOLN: 2.0000 mg | INTRAVENOUS | Status: DC | PRN

## 2017-12-13 MED ORDER — HEPARIN (PORCINE) IN NACL 1000-0.9 UT/500ML-% IV SOLN
INTRAVENOUS | Status: DC | PRN
  Administered 2017-12-13 (×2): 500 mL

## 2017-12-13 MED ORDER — SODIUM CHLORIDE 0.9 % IV SOLN: 250.0000 mL | INTRAVENOUS | Status: DC | PRN

## 2017-12-13 MED ORDER — PALIPERIDONE ER 6 MG PO TB24: 6.0000 mg | ORAL_TABLET | Freq: Every day | ORAL | Status: DC

## 2017-12-13 MED ORDER — SODIUM CHLORIDE 0.9% FLUSH: 3.0000 mL | Freq: Two times a day (BID) | INTRAVENOUS | Status: DC

## 2017-12-13 MED ORDER — CITALOPRAM HYDROBROMIDE 20 MG PO TABS
20.0000 mg | ORAL_TABLET | Freq: Every day | ORAL | Status: DC
  Administered 2017-12-13: 20 mg via ORAL
  Filled 2017-12-13: qty 1

## 2017-12-13 MED ORDER — SODIUM CHLORIDE 0.9% FLUSH: 3.0000 mL | INTRAVENOUS | Status: DC | PRN

## 2017-12-13 MED ORDER — HYDROCHLOROTHIAZIDE 25 MG PO TABS: 25.0000 mg | ORAL_TABLET | Freq: Every day | ORAL | Status: DC

## 2017-12-13 MED ORDER — IOHEXOL 350 MG/ML SOLN
INTRAVENOUS | Status: DC | PRN
  Administered 2017-12-13: 40 mL via INTRA_ARTERIAL

## 2017-12-13 SURGICAL SUPPLY — 11 items
CATH INFINITI 5FR MULTPACK ANG (CATHETERS) ×1 IMPLANT
CATH SWAN GANZ 7F STRAIGHT (CATHETERS) ×1 IMPLANT
DEVICE CLOSURE MYNXGRIP 5F (Vascular Products) ×1 IMPLANT
KIT HEART LEFT (KITS) ×2 IMPLANT
PACK CARDIAC CATHETERIZATION (CUSTOM PROCEDURE TRAY) ×2 IMPLANT
SHEATH PINNACLE 5F 10CM (SHEATH) ×1 IMPLANT
SHEATH PINNACLE 7F 10CM (SHEATH) ×1 IMPLANT
SYR MEDRAD MARK V 150ML (SYRINGE) ×2 IMPLANT
TRANSDUCER W/STOPCOCK (MISCELLANEOUS) ×2 IMPLANT
TUBING CIL FLEX 10 FLL-RA (TUBING) ×2 IMPLANT
WIRE EMERALD 3MM-J .035X150CM (WIRE) ×1 IMPLANT

## 2017-12-13 NOTE — Plan of Care (Signed)
\\\\\   Problem: Education: Goal: Individualized Educational Video(s) Outcome: Not Applicable   Problem: Clinical Measurements: Goal: Diagnostic test results will improve Outcome: Progressing Goal: Cardiovascular complication will be avoided Outcome: Progressing   Problem: Education: Goal: Ability to demonstrate management of disease process will improve Outcome: Progressing Goal: Ability to verbalize understanding of medication therapies will improve Outcome: Progressing   Problem: Cardiac: Goal: Ability to achieve and maintain adequate cardiopulmonary perfusion will improve Outcome: Progressing

## 2017-12-13 NOTE — Progress Notes (Signed)
PROGRESS NOTE  Allen Mayer YQM:[id number] DOB: [birth year] DOA: 12/11/2017 PCP: Medicine, [facility]  HPI/Recap of past 44 hours: 57 year old male with history of COPD, active smoker, hypertension, GERD presented to ED with shortness of breath for the last 1 week, progressively worse over the last 2 days. Patient also noticed worsening abdominal distention, shortness of breath worse on lying down. In the last 2 days he is also having midsternal chest tightness with shortness of breath. In the ED, trop mildly elevated, BNP elevated. CXR with increased pulmonary vascular congestion. Pt admitted for further management.  Today, pt denies any new complaints, denies any chest pain, worsening SOB, N/V, fever/chills. Plan for cath today.   Assessment/Plan: Principal Problem:   Acute CHF (congestive heart failure) (HCC) Active Problems:   Tobacco abuse   Essential hypertension, benign   Elevated troponin   COPD (chronic obstructive pulmonary disease) (HCC)   SOB (shortness of breath)   Coronary artery calcification seen on CAT scan   DCM (dilated cardiomyopathy) (HCC)  Acute combined systolic and diastolic HF, chronicity unknown BNP elevated CXR showed increased vascular congestion CT angiogram of the chest negative for PE however showed cardiomegaly with interstitial pulmonary edema, small bilateral pleural effusions suggesting CHF, small amount of ascites likely due to hepatic congestion in the setting of CHF, two-vessel CAD with aortic atherosclerosis Serial trop stable at 0.4, EKG with t-wave inversion Echo with EF of 25-30%, Diffuse hypokinesis, grade 3 diastolic dysfunction Cardiology on board: Plan for R and L heart cath at [facility]. Will re-evaluate dosage of lasix post cath Continue imdur Not on beta-blocker secondary to acute decompensation and cocaine use, hold ACE inhibitor secondary to renal insufficiency, will restart after cath Telemetry  COPD with mild acute  exacerbation Improving Afebrile with leukocytosis (likely due to prednisone) Continue nebs, prednisone Continue doxycycline  Acute kidney injury Improving Likely due to cardiorenal syndrome Held home HCTZ, lisinopril Daily BMET  Elevated troponin with abn EKG Chest pain free Trop with a flat trend Cardiology on board, plan for heart cath  Essential hypertension Stable Continue hydralazine, imdur for now  Tobacco/cocaine abuse Pt advised to quit UDS positive for cocaine Nicotine patch ordered      Code Status: Full  Family Communication: Fianc at bedside  Disposition Plan: To be determined post cath   Consultants:  Cardiology  Procedures:  None  Antimicrobials:  Doxycycline  DVT prophylaxis: Heparin   Objective: Vitals:   12/12/17 1929 12/12/17 2105 12/13/17 0434 12/13/17 0919  BP:  130/80 (!) 145/99   Pulse:  79 66   Resp:  16 18   Temp:  98.3 F (36.8 C) 98.6 F (37 C)   TempSrc:  Oral Oral   SpO2: 96% 90% 96% 98%  Weight:   93.7 kg   Height:        Intake/Output Summary (Last 24 hours) at 12/13/2017 1254 Last data filed at 12/13/2017 0853 Gross per 24 hour  Intake 469.61 ml  Output 1950 ml  Net -1480.39 ml   Filed Weights   12/11/17 0855 12/12/17 0500 12/13/17 0434  Weight: 104.3 kg 93.1 kg 93.7 kg    Exam:   General: NAD  Cardiovascular: S1, S2 present  Respiratory: CTA B  Abdomen: Soft, nontender, nondistended, bowel sounds present  Musculoskeletal: No pedal edema bilaterally  Skin: Normal  Psychiatry: Normal mood   Data Reviewed: CBC: Recent Labs  Lab 12/11/17 0914 12/13/17 0416  WBC 6.6 12.9*  NEUTROABS 4.0  --   HGB  15.9 16.0  HCT 47.4 48.7  MCV 91.3 91.5  PLT 231 315   Basic Metabolic Panel: Recent Labs  Lab 12/11/17 0914 12/12/17 0157 12/13/17 0416  NA 139 141 141  K 4.5 4.3 4.1  CL 105 102 104  CO2 22 27 28   GLUCOSE 118* 146* 132*  BUN 16 20 24*  CREATININE 1.45* 1.31* 1.12  CALCIUM  8.9 9.3 9.0   GFR: Estimated Creatinine Clearance: 85.4 mL/min (by C-G formula based on SCr of 1.12 mg/dL). Liver Function Tests: Recent Labs  Lab 12/11/17 0914  AST 80*  ALT 128*  ALKPHOS 56  BILITOT 1.0  PROT 6.6  ALBUMIN 3.8   Recent Labs  Lab 12/11/17 0914  LIPASE 34   No results for input(s): AMMONIA in the last 168 hours. Coagulation Profile: Recent Labs  Lab 12/13/17 0416  INR 0.96   Cardiac Enzymes: Recent Labs  Lab 12/11/17 0914 12/11/17 1450 12/11/17 2029 12/12/17 0157  TROPONINI 0.06* 0.04* 0.04* 0.04*   BNP (last 3 results) No results for input(s): PROBNP in the last 8760 hours. HbA1C: No results for input(s): HGBA1C in the last 72 hours. CBG: No results for input(s): GLUCAP in the last 168 hours. Lipid Profile: No results for input(s): CHOL, HDL, LDLCALC, TRIG, CHOLHDL, LDLDIRECT in the last 72 hours. Thyroid Function Tests: No results for input(s): TSH, T4TOTAL, FREET4, T3FREE, THYROIDAB in the last 72 hours. Anemia Panel: No results for input(s): VITAMINB12, FOLATE, FERRITIN, TIBC, IRON, RETICCTPCT in the last 72 hours. Urine analysis:    Component Value Date/Time   COLORURINE YELLOW 12/11/2017 1247   APPEARANCEUR CLEAR 12/11/2017 1247   LABSPEC 1.011 12/11/2017 1247   PHURINE 6.0 12/11/2017 1247   GLUCOSEU NEGATIVE 12/11/2017 1247   HGBUR SMALL (A) 12/11/2017 1247   BILIRUBINUR NEGATIVE 12/11/2017 1247   KETONESUR NEGATIVE 12/11/2017 1247   PROTEINUR 30 (A) 12/11/2017 1247   UROBILINOGEN 1.0 01/31/2012 0445   NITRITE NEGATIVE 12/11/2017 1247   LEUKOCYTESUR LARGE (A) 12/11/2017 1247   Sepsis Labs: @LABRCNTIP (procalcitonin:4,lacticidven:4)  )No results found for this or any previous visit (from the past 240 hour(s)).    Studies: No results found.  Scheduled Meds: . aspirin EC  81 mg Oral Daily  . benztropine  1 mg Oral QHS  . budesonide  0.25 mg Nebulization BID  . citalopram  20 mg Oral Daily  . doxycycline  100 mg Oral Q12H   . feeding supplement (ENSURE ENLIVE)  237 mL Oral BID BM  . gabapentin  400 mg Oral QHS  . heparin  5,000 Units Subcutaneous Q8H  . hydrALAZINE  10 mg Oral Q8H  . levalbuterol  0.63 mg Nebulization BID   And  . ipratropium  0.5 mg Nebulization BID  . isosorbide mononitrate  30 mg Oral Daily  . nicotine  21 mg Transdermal Daily  . paliperidone  6 mg Oral Daily  . pantoprazole  40 mg Oral Daily  . pneumococcal 23 valent vaccine  0.5 mL Intramuscular Tomorrow-1000  . predniSONE  40 mg Oral Q breakfast  . sodium chloride flush  3 mL Intravenous Q12H  . sodium chloride flush  3 mL Intravenous Q12H    Continuous Infusions: . sodium chloride    . sodium chloride    . sodium chloride 10 mL/hr at 12/13/17 0600     LOS: 2 days     Briant Cedar, MD [facility]   If 7PM-7AM, please contact night-coverage [email or web address] Password [email or web address] 12/13/2017, 12:54 PM

## 2017-12-13 NOTE — Plan of Care (Signed)
  Problem: Clinical Measurements: Goal: Diagnostic test results will improve Outcome: Progressing Goal: Cardiovascular complication will be avoided Outcome: Progressing   Problem: Education: Goal: Knowledge of General Education information will improve Description Including pain rating scale, medication(s)/side effects and non-pharmacologic comfort measures Outcome: Adequate for Discharge   Problem: Health Behavior/Discharge Planning: Goal: Ability to manage health-related needs will improve Outcome: Adequate for Discharge   Problem: Clinical Measurements: Goal: Ability to maintain clinical measurements within normal limits will improve Outcome: Adequate for Discharge Goal: Will remain free from infection Outcome: Adequate for Discharge Goal: Respiratory complications will improve Outcome: Adequate for Discharge   Problem: Activity: Goal: Risk for activity intolerance will decrease Outcome: Adequate for Discharge   Problem: Nutrition: Goal: Adequate nutrition will be maintained Outcome: Adequate for Discharge   Problem: Coping: Goal: Level of anxiety will decrease Outcome: Adequate for Discharge   Problem: Elimination: Goal: Will not experience complications related to bowel motility Outcome: Adequate for Discharge Goal: Will not experience complications related to urinary retention Outcome: Adequate for Discharge   Problem: Pain Managment: Goal: General experience of comfort will improve Outcome: Adequate for Discharge   Problem: Safety: Goal: Ability to remain free from injury will improve Outcome: Adequate for Discharge   Problem: Skin Integrity: Goal: Risk for impaired skin integrity will decrease Outcome: Adequate for Discharge

## 2017-12-13 NOTE — Interval H&P Note (Signed)
Cath Lab Visit (complete for each Cath Lab visit)  Clinical Evaluation Leading to the Procedure:   ACS: Yes.    Non-ACS:    Anginal Classification: CCS II  Anti-ischemic medical therapy: No Therapy  Non-Invasive Test Results: No non-invasive testing performed  Prior CABG: No previous CABG      History and Physical Interval Note:  12/13/2017 4:14 PM  Allen Mayer  has presented today for surgery, with the diagnosis of abnormal ekg  The various methods of treatment have been discussed with the patient and family. After consideration of risks, benefits and other options for treatment, the patient has consented to  Procedure(s): RIGHT/LEFT HEART CATH AND CORONARY ANGIOGRAPHY (N/A) as a surgical intervention .  The patient's history has been reviewed, patient examined, no change in status, stable for surgery.  I have reviewed the patient's chart and labs.  Questions were answered to the patient's satisfaction.     Nanetta Batty

## 2017-12-13 NOTE — Progress Notes (Signed)
Pharmacist Heart Failure Core Measure Documentation  Assessment: Julia Dec has an EF documented as 25-30% on 9/2 by ECHO.  Rationale: Heart failure patients with left ventricular systolic dysfunction (LVSD) and an EF < 40% should be prescribed an angiotensin converting enzyme inhibitor (ACEI) or angiotensin receptor blocker (ARB) at discharge unless a contraindication is documented in the medical record.  This patient is not currently on an ACEI or ARB for HF.  This note is being placed in the record in order to provide documentation that a contraindication to the use of these agents is present for this encounter.   ACE Inhibitor or Angiotensin Receptor Blocker is contraindicated (specify all that apply)  []   ACEI allergy AND ARB allergy []   Angioedema []   Moderate or severe aortic stenosis []   Hyperkalemia []   Hypotension []   Renal artery stenosis [x]   Worsening renal function, preexisting renal disease or dysfunction   Ivery Quale 12/13/2017 11:31 AM

## 2017-12-13 NOTE — Progress Notes (Signed)
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  Moderate pulmonary HTN with PASP and PA mean .  Needs continue diuresis to lower filling pressures.  Preferably with IV lasix.  OK to stop ASA.  Recommend keeping overnight to diurese.  Add Lasix 40mg  IV BID and check BMET in am. If creatinine is stable in am then add Entresto.   If patient is adamant about leaving tonight then would send home on Lasix 40mg  PO BID.

## 2017-12-13 NOTE — Progress Notes (Signed)
[facility]. Picked up by [facility]. Pt will return after procedure.

## 2017-12-13 NOTE — Progress Notes (Signed)
Pt continues to ask nursing staff if he can eat or drink. RN continues to repeatedly inform the pt that he is NPO for a procedure and is not allowed to eat. Will continue to monitor pt.

## 2017-12-13 NOTE — Progress Notes (Signed)
Pt repeatedly told by RN and NT that he is not allowed to go off the floor on the elevator. Pt and family member keep asking nursing staff if he can go off floor because he feels "claustrophobic". RN informed the pt that is is unit and hospital policy that the pt is not allowed off the unit. MD also informed the pt on 12/12/2016 due to his cardiac condition and cardiac monitoring that he is not allowed to go off the unit.

## 2017-12-13 NOTE — Care Management (Signed)
Hospitalization Return to top of Heart Failure RRG - ISC Goal Length of Stay: Ambulatory or 2 days  Note: Goal Length of Stay assumes optimal recovery, decision making, and care. Patients may be discharged to a lower level of care (either later than or sooner than the goal) when it is appropriate for their clinical status and care needs. Discharge Readiness Return to top of Heart Failure RRG - ISC  Discharge readiness is indicated by patient meeting Recovery Milestones, including ALL of the following: ? Hemodynamic stability ? MI excluded ? Cardiac rate and rhythm acceptable ? Tachypnea absent ? Immediate precipitating factors absent or controlled ? Pulmonary edema absent or acceptable for next level of care ? Peripheral or sacral edema absent, at baseline, or improved ? Volume status acceptable on oral medication ? Oxygenation at baseline or acceptable for next level of care ? Renal function at baseline or acceptable for next level of care ? Electrolyte levels normal or acceptable for next level of care ? Mental status at baseline ? Oral hydration, medications, and diet ? Ambulatory ? Discharge plans and education understood

## 2017-12-13 NOTE — Progress Notes (Signed)
Pt states that he will purchase a scales.  Will continue to go to [facility]. Pt's fiancee is in the room and agreed to help pt keep his word by taking his medications, purchasing a scale and PCP follow up.

## 2017-12-13 NOTE — H&P (View-Only) (Signed)
 Progress Note  Patient Name: Allen Mayer Date of Encounter: 12/13/2017  Primary Cardiologist: James Hochrein, MD   Subjective   No chest pain or SOB, feels much better,  Still in denial about condition.  Discussed importance of healthy living to he and his wife.    Inpatient Medications    Scheduled Meds: . aspirin EC  81 mg Oral Daily  . budesonide  0.25 mg Nebulization BID  . doxycycline  100 mg Oral Q12H  . feeding supplement (ENSURE ENLIVE)  237 mL Oral BID BM  . gabapentin  400 mg Oral QHS  . heparin  5,000 Units Subcutaneous Q8H  . hydrALAZINE  10 mg Oral Q8H  . levalbuterol  0.63 mg Nebulization BID   And  . ipratropium  0.5 mg Nebulization BID  . isosorbide mononitrate  30 mg Oral Daily  . nicotine  21 mg Transdermal Daily  . pantoprazole  40 mg Oral Daily  . pneumococcal 23 valent vaccine  0.5 mL Intramuscular Tomorrow-1000  . predniSONE  40 mg Oral Q breakfast  . sodium chloride flush  3 mL Intravenous Q12H  . sodium chloride flush  3 mL Intravenous Q12H   Continuous Infusions: . sodium chloride    . sodium chloride    . sodium chloride 10 mL/hr at 12/13/17 0600   PRN Meds: sodium chloride, sodium chloride, acetaminophen, hydrALAZINE, levalbuterol, morphine injection, nitroGLYCERIN, ondansetron (ZOFRAN) IV, sodium chloride flush, sodium chloride flush   Vital Signs    Vitals:   12/12/17 1443 12/12/17 1929 12/12/17 2105 12/13/17 0434  BP: 128/90  130/80 (!) 145/99  Pulse: 86  79 66  Resp: 20  16 18  Temp: 98.3 F (36.8 C)  98.3 F (36.8 C) 98.6 F (37 C)  TempSrc: Oral  Oral Oral  SpO2: 96% 96% 90% 96%  Weight:    93.7 kg  Height:        Intake/Output Summary (Last 24 hours) at 12/13/2017 0805 Last data filed at 12/13/2017 0600 Gross per 24 hour  Intake 946.61 ml  Output 2700 ml  Net -1753.39 ml   Filed Weights   12/11/17 0855 12/12/17 0500 12/13/17 0434  Weight: 104.3 kg 93.1 kg 93.7 kg    Telemetry    SR - Personally Reviewed  ECG     No new - Personally Reviewed  Physical Exam   GEN: No acute distress.   Neck: No JVD Cardiac: RRR, no murmurs, rubs, or gallops.  Respiratory: Clear to auscultation bilaterally. GI: Soft, nontender, non-distended  MS: No edema; No deformity. Neuro:  Nonfocal  Psych: Normal affect   Labs    Chemistry Recent Labs  Lab 12/11/17 0914 12/12/17 0157 12/13/17 0416  NA 139 141 141  K 4.5 4.3 4.1  CL 105 102 104  CO2 22 27 28  GLUCOSE 118* 146* 132*  BUN 16 20 24*  CREATININE 1.45* 1.31* 1.12  CALCIUM 8.9 9.3 9.0  PROT 6.6  --   --   ALBUMIN 3.8  --   --   AST 80*  --   --   ALT 128*  --   --   ALKPHOS 56  --   --   BILITOT 1.0  --   --   GFRNONAA 52* 58* >60  GFRAA 60* >60 >60  ANIONGAP 12 12 9     Hematology Recent Labs  Lab 12/11/17 0914 12/13/17 0416  WBC 6.6 12.9*  RBC 5.19 5.32  HGB 15.9 16.0  HCT 47.4 48.7    MCV 91.3 91.5  MCH 30.6 30.1  MCHC 33.5 32.9  RDW 14.4 14.5  PLT 231 315    Cardiac Enzymes Recent Labs  Lab 12/11/17 0914 12/11/17 1450 12/11/17 2029 12/12/17 0157  TROPONINI 0.06* 0.04* 0.04* 0.04*   No results for input(s): TROPIPOC in the last 168 hours.   BNP Recent Labs  Lab 12/11/17 0914  BNP 863.3*     DDimer No results for input(s): DDIMER in the last 168 hours.   Radiology    Dg Chest 2 View  Result Date: 12/11/2017 CLINICAL DATA:  56-year-old male with shortness of breath for the past month which is worse over the past 2 days accompanied by chest pain and productive cough EXAM: CHEST - 2 VIEW COMPARISON:  Prior chest x-ray 12/04/2017 FINDINGS: Borderline cardiomegaly. Increased pulmonary vascular congestion and diffuse interstitial prominence. Interval development of a small right-sided pleural effusion. Diffuse bronchial wall thickening and peribronchial cuffing. Nonspecific patchy airspace opacities in both lung bases have a linear appearance most suggestive of atelectasis. No acute osseous abnormality. IMPRESSION: 1.  Interval development of a small right-sided pleural effusion and associated right basilar atelectasis. 2. Slightly increased pulmonary vascular congestion and diffuse interstitial prominence. Differential considerations include developing mild interstitial edema versus atypical or viral respiratory infection. 3. Stable borderline cardiomegaly. 4. Persistent advanced bronchitic changes consistent with the clinical history of COPD. Acute on chronic bronchitis is difficult to exclude radiographically. Electronically Signed   By: Heath  McCullough M.D.   On: 12/11/2017 09:52   Ct Angio Chest Pe W And/or Wo Contrast  Result Date: 12/11/2017 CLINICAL DATA:  56-year-old male with history of generalized abdominal pain. Mild fever. Shortness of breath for 1 month. Chest pain. EXAM: CT ANGIOGRAPHY CHEST CT ABDOMEN AND PELVIS WITH CONTRAST TECHNIQUE: Multidetector CT imaging of the chest was performed using the standard protocol during bolus administration of intravenous contrast. Multiplanar CT image reconstructions and MIPs were obtained to evaluate the vascular anatomy. Multidetector CT imaging of the abdomen and pelvis was performed using the standard protocol during bolus administration of intravenous contrast. CONTRAST:  100mL ISOVUE-370 IOPAMIDOL (ISOVUE-370) INJECTION 76% COMPARISON:  CT the abdomen and pelvis 01/31/2012. No prior chest CT. FINDINGS: Comment: Study is limited for assessment of pulmonary embolism due to excessive patient respiratory motion. CTA CHEST FINDINGS Cardiovascular: No central, lobar or proximal segmental sized filling defects to suggest pulmonary embolism. Larger segmental and subsegmental size filling defects cannot be entirely excluded secondary to excessive patient respiratory motion. Heart size is mildly enlarged. There is no significant pericardial fluid, thickening or pericardial calcification. There is aortic atherosclerosis, as well as atherosclerosis of the great vessels of the  mediastinum and the coronary arteries, including calcified atherosclerotic plaque in the left anterior descending and left circumflex coronary arteries. Mediastinum/Nodes: No pathologically enlarged mediastinal or hilar lymph nodes. Esophagus is unremarkable in appearance. No axillary lymphadenopathy. Lungs/Pleura: Small bilateral pleural effusions lying dependently. Patchy areas of mild ground-glass attenuation and widespread mild interlobular septal thickening, suggesting a background of mild interstitial pulmonary edema. No confluent consolidative airspace disease. No suspicious appearing pulmonary nodules or masses are noted. Musculoskeletal: There are no aggressive appearing lytic or blastic lesions noted in the visualized portions of the skeleton. Review of the MIP images confirms the above findings. CT ABDOMEN and PELVIS FINDINGS Hepatobiliary: No suspicious cystic or solid hepatic lesions. No intra or extrahepatic biliary ductal dilatation. No calcified gallstones. Gallbladder wall appears edematous. Gallbladder does not appear overly distended. No pericholecystic fluid. Pancreas: No pancreatic mass. No   pancreatic ductal dilatation. No pancreatic or peripancreatic fluid or inflammatory changes. Spleen: Unremarkable. Adrenals/Urinary Tract: Bilateral kidneys and bilateral adrenal glands are normal in appearance. No hydroureteronephrosis. Urinary bladder is normal in appearance. Stomach/Bowel: Normal appearance of the stomach. No pathologic dilatation of small bowel or colon. Normal appendix. Vascular/Lymphatic: Aortic atherosclerosis, without evidence of aneurysm or dissection in the abdominal or pelvic vasculature. No lymphadenopathy noted in the abdomen or pelvis. Reproductive: Prostate gland and seminal vesicles are unremarkable in appearance. Other: Some fluid is noted tracking along the lower aspect of the right side of the retroperitoneum and along the right pelvic sidewall. There is also trace volume  of ascites. No pneumoperitoneum. Musculoskeletal: There are no aggressive appearing lytic or blastic lesions noted in the visualized portions of the skeleton. Review of the MIP images confirms the above findings. IMPRESSION: 1. Limited study, which demonstrates no evidence of central, lobar or proximal segmental sized pulmonary embolism. Smaller distal segmental and subsegmental sized emboli cannot be entirely excluded secondary to respiratory motion. 2. Cardiomegaly with evidence of interstitial pulmonary edema and small bilateral pleural effusions suggesting congestive heart failure. 3. Small amount of ascites. Small volume of fluid tracking along the right retroperitoneum into the right side of the pelvis. Gallbladder wall edema. These findings are all favored to be related to underlying hepatic congestion in the setting of congestive heart failure. There are no calcified gallstones. Additionally, the gallbladder does not appear overly distended, and is not surrounded by overt inflammatory changes to suggest a cholecystitis at this time. 4. Aortic atherosclerosis, in addition to 2 vessel coronary artery disease. Please note that although the presence of coronary artery calcium documents the presence of coronary artery disease, the severity of this disease and any potential stenosis cannot be assessed on this non-gated CT examination. Assessment for potential risk factor modification, dietary therapy or pharmacologic therapy may be warranted, if clinically indicated. Electronically Signed   By: Daniel  Entrikin M.D.   On: 12/11/2017 12:00   Ct Abdomen Pelvis W Contrast  Result Date: 12/11/2017 CLINICAL DATA:  56-year-old male with history of generalized abdominal pain. Mild fever. Shortness of breath for 1 month. Chest pain. EXAM: CT ANGIOGRAPHY CHEST CT ABDOMEN AND PELVIS WITH CONTRAST TECHNIQUE: Multidetector CT imaging of the chest was performed using the standard protocol during bolus administration of  intravenous contrast. Multiplanar CT image reconstructions and MIPs were obtained to evaluate the vascular anatomy. Multidetector CT imaging of the abdomen and pelvis was performed using the standard protocol during bolus administration of intravenous contrast. CONTRAST:  100mL ISOVUE-370 IOPAMIDOL (ISOVUE-370) INJECTION 76% COMPARISON:  CT the abdomen and pelvis 01/31/2012. No prior chest CT. FINDINGS: Comment: Study is limited for assessment of pulmonary embolism due to excessive patient respiratory motion. CTA CHEST FINDINGS Cardiovascular: No central, lobar or proximal segmental sized filling defects to suggest pulmonary embolism. Larger segmental and subsegmental size filling defects cannot be entirely excluded secondary to excessive patient respiratory motion. Heart size is mildly enlarged. There is no significant pericardial fluid, thickening or pericardial calcification. There is aortic atherosclerosis, as well as atherosclerosis of the great vessels of the mediastinum and the coronary arteries, including calcified atherosclerotic plaque in the left anterior descending and left circumflex coronary arteries. Mediastinum/Nodes: No pathologically enlarged mediastinal or hilar lymph nodes. Esophagus is unremarkable in appearance. No axillary lymphadenopathy. Lungs/Pleura: Small bilateral pleural effusions lying dependently. Patchy areas of mild ground-glass attenuation and widespread mild interlobular septal thickening, suggesting a background of mild interstitial pulmonary edema. No confluent consolidative airspace disease.   No suspicious appearing pulmonary nodules or masses are noted. Musculoskeletal: There are no aggressive appearing lytic or blastic lesions noted in the visualized portions of the skeleton. Review of the MIP images confirms the above findings. CT ABDOMEN and PELVIS FINDINGS Hepatobiliary: No suspicious cystic or solid hepatic lesions. No intra or extrahepatic biliary ductal dilatation. No  calcified gallstones. Gallbladder wall appears edematous. Gallbladder does not appear overly distended. No pericholecystic fluid. Pancreas: No pancreatic mass. No pancreatic ductal dilatation. No pancreatic or peripancreatic fluid or inflammatory changes. Spleen: Unremarkable. Adrenals/Urinary Tract: Bilateral kidneys and bilateral adrenal glands are normal in appearance. No hydroureteronephrosis. Urinary bladder is normal in appearance. Stomach/Bowel: Normal appearance of the stomach. No pathologic dilatation of small bowel or colon. Normal appendix. Vascular/Lymphatic: Aortic atherosclerosis, without evidence of aneurysm or dissection in the abdominal or pelvic vasculature. No lymphadenopathy noted in the abdomen or pelvis. Reproductive: Prostate gland and seminal vesicles are unremarkable in appearance. Other: Some fluid is noted tracking along the lower aspect of the right side of the retroperitoneum and along the right pelvic sidewall. There is also trace volume of ascites. No pneumoperitoneum. Musculoskeletal: There are no aggressive appearing lytic or blastic lesions noted in the visualized portions of the skeleton. Review of the MIP images confirms the above findings. IMPRESSION: 1. Limited study, which demonstrates no evidence of central, lobar or proximal segmental sized pulmonary embolism. Smaller distal segmental and subsegmental sized emboli cannot be entirely excluded secondary to respiratory motion. 2. Cardiomegaly with evidence of interstitial pulmonary edema and small bilateral pleural effusions suggesting congestive heart failure. 3. Small amount of ascites. Small volume of fluid tracking along the right retroperitoneum into the right side of the pelvis. Gallbladder wall edema. These findings are all favored to be related to underlying hepatic congestion in the setting of congestive heart failure. There are no calcified gallstones. Additionally, the gallbladder does not appear overly distended, and  is not surrounded by overt inflammatory changes to suggest a cholecystitis at this time. 4. Aortic atherosclerosis, in addition to 2 vessel coronary artery disease. Please note that although the presence of coronary artery calcium documents the presence of coronary artery disease, the severity of this disease and any potential stenosis cannot be assessed on this non-gated CT examination. Assessment for potential risk factor modification, dietary therapy or pharmacologic therapy may be warranted, if clinically indicated. Electronically Signed   By: Daniel  Entrikin M.D.   On: 12/11/2017 12:00    Cardiac Studies   12/11/17 Echo Study Conclusions  - Left ventricle: The cavity size was mildly dilated. Wall   thickness was normal. Systolic function was severely reduced. The   estimated ejection fraction was in the range of 25% to 30%.   Diffuse hypokinesis. Doppler parameters are consistent with a   reversible restrictive pattern, indicative of decreased left   ventricular diastolic compliance and/or increased left atrial   pressure (grade 3 diastolic dysfunction). - Right ventricle: Systolic function was mildly to moderately   reduced. - Right atrium: The atrium was mildly to moderately dilated.   Patient Profile     56 y.o. male with a hx of palpitations but no other cardiac history other than coronary calcium noted on previous chest imagingand admitted for SOB.  Has presented before with SOB and treated for SOB.  + cocaine, elevated BNP and troponin 0.06.  Assessment & Plan    Acute dyspnea with acute combined systolic and diastolic HF.   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  Cr improved to 1.12 for cath.       For questions or updates, please contact [facility] Please consult [email or web address] for contact info under Cardiology/STEMI.      Signed, Laura Ingold, NP  12/13/2017, 8:05 AM    

## 2017-12-13 NOTE — Plan of Care (Signed)
Patient pulled out his IV and took all his leads off and left shortly after his bedrest was up with no chance to give any instructions. Cardiology and [facility] notified of patient abrupt leaving of the unit, unable to get AMA papers signed.

## 2017-12-13 NOTE — Progress Notes (Signed)
Report received at bedside, looked at right groin and bedrest was completed at 19:15, went back into room to check patient and found all EKG leads off and IVF's running in the bed with IV catheter intact, unable to give any instructions or have pt sign AMA papers, notified charge RN and Central monitoring, will notify MD.

## 2017-12-13 NOTE — Progress Notes (Signed)
Site area: rt groin fv sheath pulled Site Prior to Removal:  Level 0 Pressure Applied For:  10 minutes Manual:   yes Patient Status During Pull:  stable Post Pull Site:  Level  0 Post Pull Instructions Given:  yes Post Pull Pulses Present: rt dp palpable Dressing Applied:  Gauze and teaderm Bedrest begins @ 1710 Comments:

## 2017-12-13 NOTE — Progress Notes (Signed)
Progress Note  Patient Name: Allen Mayer Date of Encounter: 12/13/2017  Primary Cardiologist: Rollene Rotunda, MD   Subjective   No chest pain or SOB, feels much better,  Still in denial about condition.  Discussed importance of healthy living to he and his wife.    Inpatient Medications    Scheduled Meds: . aspirin EC  81 mg Oral Daily  . budesonide  0.25 mg Nebulization BID  . doxycycline  100 mg Oral Q12H  . feeding supplement (ENSURE ENLIVE)  237 mL Oral BID BM  . gabapentin  400 mg Oral QHS  . heparin  5,000 Units Subcutaneous Q8H  . hydrALAZINE  10 mg Oral Q8H  . levalbuterol  0.63 mg Nebulization BID   And  . ipratropium  0.5 mg Nebulization BID  . isosorbide mononitrate  30 mg Oral Daily  . nicotine  21 mg Transdermal Daily  . pantoprazole  40 mg Oral Daily  . pneumococcal 23 valent vaccine  0.5 mL Intramuscular Tomorrow-1000  . predniSONE  40 mg Oral Q breakfast  . sodium chloride flush  3 mL Intravenous Q12H  . sodium chloride flush  3 mL Intravenous Q12H   Continuous Infusions: . sodium chloride    . sodium chloride    . sodium chloride 10 mL/hr at 12/13/17 0600   PRN Meds: sodium chloride, sodium chloride, acetaminophen, hydrALAZINE, levalbuterol, morphine injection, nitroGLYCERIN, ondansetron (ZOFRAN) IV, sodium chloride flush, sodium chloride flush   Vital Signs    Vitals:   12/12/17 1443 12/12/17 1929 12/12/17 2105 12/13/17 0434  BP: 128/90  130/80 (!) 145/99  Pulse: 86  79 66  Resp: 20  16 18   Temp: 98.3 F (36.8 C)  98.3 F (36.8 C) 98.6 F (37 C)  TempSrc: Oral  Oral Oral  SpO2: 96% 96% 90% 96%  Weight:    93.7 kg  Height:        Intake/Output Summary (Last 24 hours) at 12/13/2017 0805 Last data filed at 12/13/2017 0600 Gross per 24 hour  Intake 946.61 ml  Output 2700 ml  Net -1753.39 ml   Filed Weights   12/11/17 0855 12/12/17 0500 12/13/17 0434  Weight: 104.3 kg 93.1 kg 93.7 kg    Telemetry    SR - Personally Reviewed  ECG     No new - Personally Reviewed  Physical Exam   GEN: No acute distress.   Neck: No JVD Cardiac: RRR, no murmurs, rubs, or gallops.  Respiratory: Clear to auscultation bilaterally. GI: Soft, nontender, non-distended  MS: No edema; No deformity. Neuro:  Nonfocal  Psych: Normal affect   Labs    Chemistry Recent Labs  Lab 12/11/17 0914 12/12/17 0157 12/13/17 0416  NA 139 141 141  K 4.5 4.3 4.1  CL 105 102 104  CO2 22 27 28   GLUCOSE 118* 146* 132*  BUN 16 20 24*  CREATININE 1.45* 1.31* 1.12  CALCIUM 8.9 9.3 9.0  PROT 6.6  --   --   ALBUMIN 3.8  --   --   AST 80*  --   --   ALT 128*  --   --   ALKPHOS 56  --   --   BILITOT 1.0  --   --   GFRNONAA 52* 58* >60  GFRAA 60* >60 >60  ANIONGAP 12 12 9      Hematology Recent Labs  Lab 12/11/17 0914 12/13/17 0416  WBC 6.6 12.9*  RBC 5.19 5.32  HGB 15.9 16.0  HCT 47.4 48.7  MCV 91.3 91.5  MCH 30.6 30.1  MCHC 33.5 32.9  RDW 14.4 14.5  PLT 231 315    Cardiac Enzymes Recent Labs  Lab 12/11/17 0914 12/11/17 1450 12/11/17 2029 12/12/17 0157  TROPONINI 0.06* 0.04* 0.04* 0.04*   No results for input(s): TROPIPOC in the last 168 hours.   BNP Recent Labs  Lab 12/11/17 0914  BNP 863.3*     DDimer No results for input(s): DDIMER in the last 168 hours.   Radiology    Dg Chest 2 View  Result Date: 12/11/2017 CLINICAL DATA:  57 year old male with shortness of breath for the past month which is worse over the past 2 days accompanied by chest pain and productive cough EXAM: CHEST - 2 VIEW COMPARISON:  Prior chest x-ray 12/04/2017 FINDINGS: Borderline cardiomegaly. Increased pulmonary vascular congestion and diffuse interstitial prominence. Interval development of a small right-sided pleural effusion. Diffuse bronchial wall thickening and peribronchial cuffing. Nonspecific patchy airspace opacities in both lung bases have a linear appearance most suggestive of atelectasis. No acute osseous abnormality. IMPRESSION: 1.  Interval development of a small right-sided pleural effusion and associated right basilar atelectasis. 2. Slightly increased pulmonary vascular congestion and diffuse interstitial prominence. Differential considerations include developing mild interstitial edema versus atypical or viral respiratory infection. 3. Stable borderline cardiomegaly. 4. Persistent advanced bronchitic changes consistent with the clinical history of COPD. Acute on chronic bronchitis is difficult to exclude radiographically. Electronically Signed   By: Malachy Moan M.D.   On: 12/11/2017 09:52   Ct Angio Chest Pe W And/or Wo Contrast  Result Date: 12/11/2017 CLINICAL DATA:  57 year old male with history of generalized abdominal pain. Mild fever. Shortness of breath for 1 month. Chest pain. EXAM: CT ANGIOGRAPHY CHEST CT ABDOMEN AND PELVIS WITH CONTRAST TECHNIQUE: Multidetector CT imaging of the chest was performed using the standard protocol during bolus administration of intravenous contrast. Multiplanar CT image reconstructions and MIPs were obtained to evaluate the vascular anatomy. Multidetector CT imaging of the abdomen and pelvis was performed using the standard protocol during bolus administration of intravenous contrast. CONTRAST:  ISOVUE-370 IOPAMIDOL (ISOVUE-370) INJECTION 76% COMPARISON:  CT the abdomen and pelvis 01/31/2012. No prior chest CT. FINDINGS: Comment: Study is limited for assessment of pulmonary embolism due to excessive patient respiratory motion. CTA CHEST FINDINGS Cardiovascular: No central, lobar or proximal segmental sized filling defects to suggest pulmonary embolism. Larger segmental and subsegmental size filling defects cannot be entirely excluded secondary to excessive patient respiratory motion. Heart size is mildly enlarged. There is no significant pericardial fluid, thickening or pericardial calcification. There is aortic atherosclerosis, as well as atherosclerosis of the great vessels of the  mediastinum and the coronary arteries, including calcified atherosclerotic plaque in the left anterior descending and left circumflex coronary arteries. Mediastinum/Nodes: No pathologically enlarged mediastinal or hilar lymph nodes. Esophagus is unremarkable in appearance. No axillary lymphadenopathy. Lungs/Pleura: Small bilateral pleural effusions lying dependently. Patchy areas of mild ground-glass attenuation and widespread mild interlobular septal thickening, suggesting a background of mild interstitial pulmonary edema. No confluent consolidative airspace disease. No suspicious appearing pulmonary nodules or masses are noted. Musculoskeletal: There are no aggressive appearing lytic or blastic lesions noted in the visualized portions of the skeleton. Review of the MIP images confirms the above findings. CT ABDOMEN and PELVIS FINDINGS Hepatobiliary: No suspicious cystic or solid hepatic lesions. No intra or extrahepatic biliary ductal dilatation. No calcified gallstones. Gallbladder wall appears edematous. Gallbladder does not appear overly distended. No pericholecystic fluid. Pancreas: No pancreatic mass. No  pancreatic ductal dilatation. No pancreatic or peripancreatic fluid or inflammatory changes. Spleen: Unremarkable. Adrenals/Urinary Tract: Bilateral kidneys and bilateral adrenal glands are normal in appearance. No hydroureteronephrosis. Urinary bladder is normal in appearance. Stomach/Bowel: Normal appearance of the stomach. No pathologic dilatation of small bowel or colon. Normal appendix. Vascular/Lymphatic: Aortic atherosclerosis, without evidence of aneurysm or dissection in the abdominal or pelvic vasculature. No lymphadenopathy noted in the abdomen or pelvis. Reproductive: Prostate gland and seminal vesicles are unremarkable in appearance. Other: Some fluid is noted tracking along the lower aspect of the right side of the retroperitoneum and along the right pelvic sidewall. There is also trace volume  of ascites. No pneumoperitoneum. Musculoskeletal: There are no aggressive appearing lytic or blastic lesions noted in the visualized portions of the skeleton. Review of the MIP images confirms the above findings. IMPRESSION: 1. Limited study, which demonstrates no evidence of central, lobar or proximal segmental sized pulmonary embolism. Smaller distal segmental and subsegmental sized emboli cannot be entirely excluded secondary to respiratory motion. 2. Cardiomegaly with evidence of interstitial pulmonary edema and small bilateral pleural effusions suggesting congestive heart failure. 3. Small amount of ascites. Small volume of fluid tracking along the right retroperitoneum into the right side of the pelvis. Gallbladder wall edema. These findings are all favored to be related to underlying hepatic congestion in the setting of congestive heart failure. There are no calcified gallstones. Additionally, the gallbladder does not appear overly distended, and is not surrounded by overt inflammatory changes to suggest a cholecystitis at this time. 4. Aortic atherosclerosis, in addition to 2 vessel coronary artery disease. Please note that although the presence of coronary artery calcium documents the presence of coronary artery disease, the severity of this disease and any potential stenosis cannot be assessed on this non-gated CT examination. Assessment for potential risk factor modification, dietary therapy or pharmacologic therapy may be warranted, if clinically indicated. Electronically Signed   By: Trudie Reed M.D.   On: 12/11/2017 12:00   Ct Abdomen Pelvis W Contrast  Result Date: 12/11/2017 CLINICAL DATA:  57 year old male with history of generalized abdominal pain. Mild fever. Shortness of breath for 1 month. Chest pain. EXAM: CT ANGIOGRAPHY CHEST CT ABDOMEN AND PELVIS WITH CONTRAST TECHNIQUE: Multidetector CT imaging of the chest was performed using the standard protocol during bolus administration of  intravenous contrast. Multiplanar CT image reconstructions and MIPs were obtained to evaluate the vascular anatomy. Multidetector CT imaging of the abdomen and pelvis was performed using the standard protocol during bolus administration of intravenous contrast. CONTRAST:  ISOVUE-370 IOPAMIDOL (ISOVUE-370) INJECTION 76% COMPARISON:  CT the abdomen and pelvis 01/31/2012. No prior chest CT. FINDINGS: Comment: Study is limited for assessment of pulmonary embolism due to excessive patient respiratory motion. CTA CHEST FINDINGS Cardiovascular: No central, lobar or proximal segmental sized filling defects to suggest pulmonary embolism. Larger segmental and subsegmental size filling defects cannot be entirely excluded secondary to excessive patient respiratory motion. Heart size is mildly enlarged. There is no significant pericardial fluid, thickening or pericardial calcification. There is aortic atherosclerosis, as well as atherosclerosis of the great vessels of the mediastinum and the coronary arteries, including calcified atherosclerotic plaque in the left anterior descending and left circumflex coronary arteries. Mediastinum/Nodes: No pathologically enlarged mediastinal or hilar lymph nodes. Esophagus is unremarkable in appearance. No axillary lymphadenopathy. Lungs/Pleura: Small bilateral pleural effusions lying dependently. Patchy areas of mild ground-glass attenuation and widespread mild interlobular septal thickening, suggesting a background of mild interstitial pulmonary edema. No confluent consolidative airspace disease.  No suspicious appearing pulmonary nodules or masses are noted. Musculoskeletal: There are no aggressive appearing lytic or blastic lesions noted in the visualized portions of the skeleton. Review of the MIP images confirms the above findings. CT ABDOMEN and PELVIS FINDINGS Hepatobiliary: No suspicious cystic or solid hepatic lesions. No intra or extrahepatic biliary ductal dilatation. No  calcified gallstones. Gallbladder wall appears edematous. Gallbladder does not appear overly distended. No pericholecystic fluid. Pancreas: No pancreatic mass. No pancreatic ductal dilatation. No pancreatic or peripancreatic fluid or inflammatory changes. Spleen: Unremarkable. Adrenals/Urinary Tract: Bilateral kidneys and bilateral adrenal glands are normal in appearance. No hydroureteronephrosis. Urinary bladder is normal in appearance. Stomach/Bowel: Normal appearance of the stomach. No pathologic dilatation of small bowel or colon. Normal appendix. Vascular/Lymphatic: Aortic atherosclerosis, without evidence of aneurysm or dissection in the abdominal or pelvic vasculature. No lymphadenopathy noted in the abdomen or pelvis. Reproductive: Prostate gland and seminal vesicles are unremarkable in appearance. Other: Some fluid is noted tracking along the lower aspect of the right side of the retroperitoneum and along the right pelvic sidewall. There is also trace volume of ascites. No pneumoperitoneum. Musculoskeletal: There are no aggressive appearing lytic or blastic lesions noted in the visualized portions of the skeleton. Review of the MIP images confirms the above findings. IMPRESSION: 1. Limited study, which demonstrates no evidence of central, lobar or proximal segmental sized pulmonary embolism. Smaller distal segmental and subsegmental sized emboli cannot be entirely excluded secondary to respiratory motion. 2. Cardiomegaly with evidence of interstitial pulmonary edema and small bilateral pleural effusions suggesting congestive heart failure. 3. Small amount of ascites. Small volume of fluid tracking along the right retroperitoneum into the right side of the pelvis. Gallbladder wall edema. These findings are all favored to be related to underlying hepatic congestion in the setting of congestive heart failure. There are no calcified gallstones. Additionally, the gallbladder does not appear overly distended, and  is not surrounded by overt inflammatory changes to suggest a cholecystitis at this time. 4. Aortic atherosclerosis, in addition to 2 vessel coronary artery disease. Please note that although the presence of coronary artery calcium documents the presence of coronary artery disease, the severity of this disease and any potential stenosis cannot be assessed on this non-gated CT examination. Assessment for potential risk factor modification, dietary therapy or pharmacologic therapy may be warranted, if clinically indicated. Electronically Signed   By: Trudie Reed M.D.   On: 12/11/2017 12:00    Cardiac Studies   12/11/17 Echo Study Conclusions  - Left ventricle: The cavity size was mildly dilated. Wall   thickness was normal. Systolic function was severely reduced. The   estimated ejection fraction was in the range of 25% to 30%.   Diffuse hypokinesis. Doppler parameters are consistent with a   reversible restrictive pattern, indicative of decreased left   ventricular diastolic compliance and/or increased left atrial   pressure (grade 3 diastolic dysfunction). - Right ventricle: Systolic function was mildly to moderately   reduced. - Right atrium: The atrium was mildly to moderately dilated.   Patient Profile     57 y.o. male with a hx of palpitations but no other cardiac history other than coronary calcium noted on previous chest imagingand admitted for SOB.  Has presented before with SOB and treated for SOB.  + cocaine, elevated BNP and troponin 0.06.  Assessment & Plan    Acute dyspnea with acute combined systolic and diastolic HF.   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  Cr improved to 1.12 for cath.       For questions or updates, please contact [facility] Please consult [email or web address] for contact info under Cardiology/STEMI.      Signed, Nada Boozer, NP  12/13/2017, 8:05 AM

## 2017-12-14 ENCOUNTER — Encounter (HOSPITAL_COMMUNITY): Payer: Self-pay | Admitting: Cardiovascular Disease

## 2017-12-18 NOTE — Discharge Summary (Signed)
Discharge Summary  Shyheem Whitham XBM:[id number] DOB: April 06, 1961  PCP: [facility]  Admit date: 12/11/2017 Discharge date: 12/13/2017  Time spent: Left AMA  Recommendations for Outpatient Follow-up:  1. Left AMA  Discharge Diagnoses:  Active Hospital Problems   Diagnosis Date Noted  . Acute CHF (congestive heart failure) (HCC) 12/11/2017  . SOB (shortness of breath)   . Coronary artery calcification seen on CAT scan   . DCM (dilated cardiomyopathy) (HCC)   . Elevated troponin 12/11/2017  . COPD (chronic obstructive pulmonary disease) (HCC) 12/11/2017  . Essential hypertension, benign 11/19/2014  . Tobacco abuse 11/19/2014    Resolved Hospital Problems  No resolved problems to display.    Discharge Condition: Left AMA   Diet recommendation: Left AMA   Vitals:   12/13/17 1705 12/13/17 1710  BP: 136/83 (!) 144/90  Pulse: 91 80  Resp: 18 17  Temp:    SpO2: 95% 95%    History of present illness:  57 year old male with history of COPD, active smoker, hypertension, GERD presented to ED with shortness of breath for the last 1 week, progressively worse over the last 2 days. Patient also noticed worsening abdominal distention, shortness of breath worse on lying down. In the last 2 days he is also having midsternal chest tightness with shortness of breath. In the ED, trop mildly elevated, BNP elevated. CXR with increased pulmonary vascular congestion. Pt admitted for further management. Pt was noted to have an ECHO with EF of 25-30%, diffuse hypokinesis, Grade 3DD and was transferred to [facility] for cath. After cath, pt eloped on 12/13/17, didn't sign AMA form.  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 BNP elevated CXR showed increased vascular congestion CT angiogram of the chest negative for PE however showed cardiomegaly with interstitial pulmonary edema, small bilateral pleural effusions suggesting CHF, small amount of ascites likely due to hepatic congestion in the setting of CHF, two-vessel CAD with aortic atherosclerosis Serial trop stable at 0.4, EKG with t-wave inversion Echo with EF of 25-30%, Diffuse hypokinesis, grade 3 diastolic dysfunction Cardiology on board: Plan for R and L heart cath at [facility] Cath done on 12/13/17 showed clean coronaries with non-ischemic cardiomyopathy Eloped after cath without signing any AMA form or any further recommendation  COPD with mild acute exacerbation Improving Afebrile with leukocytosis (likely due to prednisone)  Acute kidney injury Improving Likely due to cardiorenal syndrome  Elevated troponin with abn EKG Chest pain free Trop with a flat trend Cardiology on board  Essential hypertension Stable  Tobacco/cocaine abuse Pt advised to quit UDS positive for cocaine     Procedures:  Cardiac cath  Consultations:  Cardiology  Discharge Exam: BP (!) 144/90   Pulse 80   Temp 98.6 F (37 C) (Oral)   Resp 17   Ht 6' (1.829 m)   Wt 93.7 kg   SpO2 95%   BMI 28.01 kg/m   General: Signed AMA Cardiovascular:  Respiratory:    Discharge Instructions You were cared for by a hospitalist during your hospital stay. If you have any questions about your discharge medications or the care you received while you were in the hospital after you are  discharged, you can call the unit and asked to speak with the hospitalist on call if the hospitalist that took care of you is not available. Once you are discharged, your primary care physician will handle any further medical issues. Please note that NO REFILLS for any discharge  medications will be authorized once you are discharged, as it is imperative that you return to your primary care physician (or establish a relationship with a primary care physician if you do not have one) for your aftercare needs so that they can reassess your need for medications and monitor your lab values.   Allergies as of 12/13/2017   No Known Allergies     Medication List    ASK your doctor about these medications   albuterol 108 (90 Base) MCG/ACT inhaler Commonly known as:  PROVENTIL HFA;VENTOLIN HFA Inhale 2 puffs into the lungs every 4 (four) hours as needed for wheezing or shortness of breath.   aspirin EC 81 MG tablet Take 81 mg by mouth daily.   beclomethasone 80 MCG/ACT inhaler Commonly known as:  QVAR Inhale 1 puff into the lungs 2 (two) times daily as needed.   benztropine 1 MG tablet Commonly known as:  COGENTIN Take 1 tablet (1 mg total) by mouth at bedtime.   citalopram 20 MG tablet Commonly known as:  CELEXA Take 1 tablet (20 mg total) by mouth daily.   colchicine 0.6 MG tablet Take 0.6 mg by mouth as needed.   gabapentin 400 MG capsule Commonly known as:  NEURONTIN Take 400 mg by mouth at bedtime. Ask about: Which instructions should I use?   lisinopril-hydrochlorothiazide 20-25 MG tablet Commonly known as:  PRINZIDE,ZESTORETIC Take 1 tablet by mouth daily.   nitroGLYCERIN 0.4 MG SL tablet Commonly known as:  NITROSTAT Place 0.4 mg under the tongue every 5 (five) minutes as needed for chest pain.   paliperidone 6 MG 24 hr tablet Commonly known as:  INVEGA Take 1 tablet (6 mg total) by mouth daily.   pantoprazole 20 MG tablet Commonly known as:  PROTONIX Take 20 mg by mouth daily.      No Known Allergies Follow-up Information    [facility]. Call.   Contact information: [address] [place] [address] [phone]            The results of significant diagnostics from this hospitalization (including  imaging, microbiology, ancillary and laboratory) are listed below for reference.    Significant Diagnostic Studies: Dg Chest 2 View  Result Date: 12/11/2017 CLINICAL DATA:  57 year old male with shortness of breath for the past month which is worse over the past 2 days accompanied by chest pain and productive cough EXAM: CHEST - 2 VIEW COMPARISON:  Prior chest x-ray 12/04/2017 FINDINGS: Borderline cardiomegaly. Increased pulmonary vascular congestion and diffuse interstitial prominence. Interval development of a small right-sided pleural effusion. Diffuse bronchial wall thickening and peribronchial cuffing. Nonspecific patchy airspace opacities in both lung bases have a linear appearance most suggestive of atelectasis. No acute osseous abnormality. IMPRESSION: 1. Interval development of a small right-sided pleural effusion and associated right basilar atelectasis. 2. Slightly increased pulmonary vascular congestion and diffuse interstitial prominence. Differential considerations include developing mild interstitial edema versus atypical or viral respiratory infection. 3. Stable borderline cardiomegaly. 4. Persistent advanced bronchitic changes consistent with the clinical history of COPD. Acute on chronic bronchitis is difficult to exclude radiographically. Electronically Signed   By: Malachy Moan M.D.   On: 12/11/2017 09:52   Dg  Chest 2 View  Result Date: 12/04/2017 CLINICAL DATA:  Chest pain all over.  Shortness of breath EXAM: CHEST - 2 VIEW COMPARISON:  06/22/2017 FINDINGS: Peribronchial thickening. Heart is normal size. No confluent opacities or effusions. No acute bony abnormality. IMPRESSION: Bronchitic changes. Electronically Signed   By: Charlett Nose M.D.   On: 12/04/2017 01:19   Ct Angio Chest Pe W And/or Wo Contrast  Result Date: 12/11/2017 CLINICAL DATA:  57 year old male with history of generalized abdominal pain. Mild fever. Shortness of breath for 1 month. Chest pain. EXAM: CT  ANGIOGRAPHY CHEST CT ABDOMEN AND PELVIS WITH CONTRAST TECHNIQUE: Multidetector CT imaging of the chest was performed using the standard protocol during bolus administration of intravenous contrast. Multiplanar CT image reconstructions and MIPs were obtained to evaluate the vascular anatomy. Multidetector CT imaging of the abdomen and pelvis was performed using the standard protocol during bolus administration of intravenous contrast. CONTRAST:  ISOVUE-370 IOPAMIDOL (ISOVUE-370) INJECTION 76% COMPARISON:  CT the abdomen and pelvis 01/31/2012. No prior chest CT. FINDINGS: Comment: Study is limited for assessment of pulmonary embolism due to excessive patient respiratory motion. CTA CHEST FINDINGS Cardiovascular: No central, lobar or proximal segmental sized filling defects to suggest pulmonary embolism. Larger segmental and subsegmental size filling defects cannot be entirely excluded secondary to excessive patient respiratory motion. Heart size is mildly enlarged. There is no significant pericardial fluid, thickening or pericardial calcification. There is aortic atherosclerosis, as well as atherosclerosis of the great vessels of the mediastinum and the coronary arteries, including calcified atherosclerotic plaque in the left anterior descending and left circumflex coronary arteries. Mediastinum/Nodes: No pathologically enlarged mediastinal or hilar lymph nodes. Esophagus is unremarkable in appearance. No axillary lymphadenopathy. Lungs/Pleura: Small bilateral pleural effusions lying dependently. Patchy areas of mild ground-glass attenuation and widespread mild interlobular septal thickening, suggesting a background of mild interstitial pulmonary edema. No confluent consolidative airspace disease. No suspicious appearing pulmonary nodules or masses are noted. Musculoskeletal: There are no aggressive appearing lytic or blastic lesions noted in the visualized portions of the skeleton. Review of the MIP images  confirms the above findings. CT ABDOMEN and PELVIS FINDINGS Hepatobiliary: No suspicious cystic or solid hepatic lesions. No intra or extrahepatic biliary ductal dilatation. No calcified gallstones. Gallbladder wall appears edematous. Gallbladder does not appear overly distended. No pericholecystic fluid. Pancreas: No pancreatic mass. No pancreatic ductal dilatation. No pancreatic or peripancreatic fluid or inflammatory changes. Spleen: Unremarkable. Adrenals/Urinary Tract: Bilateral kidneys and bilateral adrenal glands are normal in appearance. No hydroureteronephrosis. Urinary bladder is normal in appearance. Stomach/Bowel: Normal appearance of the stomach. No pathologic dilatation of small bowel or colon. Normal appendix. Vascular/Lymphatic: Aortic atherosclerosis, without evidence of aneurysm or dissection in the abdominal or pelvic vasculature. No lymphadenopathy noted in the abdomen or pelvis. Reproductive: Prostate gland and seminal vesicles are unremarkable in appearance. Other: Some fluid is noted tracking along the lower aspect of the right side of the retroperitoneum and along the right pelvic sidewall. There is also trace volume of ascites. No pneumoperitoneum. Musculoskeletal: There are no aggressive appearing lytic or blastic lesions noted in the visualized portions of the skeleton. Review of the MIP images confirms the above findings. IMPRESSION: 1. Limited study, which demonstrates no evidence of central, lobar or proximal segmental sized pulmonary embolism. Smaller distal segmental and subsegmental sized emboli cannot be entirely excluded secondary to respiratory motion. 2. Cardiomegaly with evidence of interstitial pulmonary edema and small bilateral pleural effusions suggesting congestive heart failure. 3. Small amount of ascites. Small volume of  fluid tracking along the right retroperitoneum into the right side of the pelvis. Gallbladder wall edema. These findings are all favored to be related to  underlying hepatic congestion in the setting of congestive heart failure. There are no calcified gallstones. Additionally, the gallbladder does not appear overly distended, and is not surrounded by overt inflammatory changes to suggest a cholecystitis at this time. 4. Aortic atherosclerosis, in addition to 2 vessel coronary artery disease. Please note that although the presence of coronary artery calcium documents the presence of coronary artery disease, the severity of this disease and any potential stenosis cannot be assessed on this non-gated CT examination. Assessment for potential risk factor modification, dietary therapy or pharmacologic therapy may be warranted, if clinically indicated. Electronically Signed   By: Trudie Reed M.D.   On: 12/11/2017 12:00   Ct Abdomen Pelvis W Contrast  Result Date: 12/11/2017 CLINICAL DATA:  57 year old male with history of generalized abdominal pain. Mild fever. Shortness of breath for 1 month. Chest pain. EXAM: CT ANGIOGRAPHY CHEST CT ABDOMEN AND PELVIS WITH CONTRAST TECHNIQUE: Multidetector CT imaging of the chest was performed using the standard protocol during bolus administration of intravenous contrast. Multiplanar CT image reconstructions and MIPs were obtained to evaluate the vascular anatomy. Multidetector CT imaging of the abdomen and pelvis was performed using the standard protocol during bolus administration of intravenous contrast. CONTRAST:  ISOVUE-370 IOPAMIDOL (ISOVUE-370) INJECTION 76% COMPARISON:  CT the abdomen and pelvis 01/31/2012. No prior chest CT. FINDINGS: Comment: Study is limited for assessment of pulmonary embolism due to excessive patient respiratory motion. CTA CHEST FINDINGS Cardiovascular: No central, lobar or proximal segmental sized filling defects to suggest pulmonary embolism. Larger segmental and subsegmental size filling defects cannot be entirely excluded secondary to excessive patient respiratory motion. Heart size is  mildly enlarged. There is no significant pericardial fluid, thickening or pericardial calcification. There is aortic atherosclerosis, as well as atherosclerosis of the great vessels of the mediastinum and the coronary arteries, including calcified atherosclerotic plaque in the left anterior descending and left circumflex coronary arteries. Mediastinum/Nodes: No pathologically enlarged mediastinal or hilar lymph nodes. Esophagus is unremarkable in appearance. No axillary lymphadenopathy. Lungs/Pleura: Small bilateral pleural effusions lying dependently. Patchy areas of mild ground-glass attenuation and widespread mild interlobular septal thickening, suggesting a background of mild interstitial pulmonary edema. No confluent consolidative airspace disease. No suspicious appearing pulmonary nodules or masses are noted. Musculoskeletal: There are no aggressive appearing lytic or blastic lesions noted in the visualized portions of the skeleton. Review of the MIP images confirms the above findings. CT ABDOMEN and PELVIS FINDINGS Hepatobiliary: No suspicious cystic or solid hepatic lesions. No intra or extrahepatic biliary ductal dilatation. No calcified gallstones. Gallbladder wall appears edematous. Gallbladder does not appear overly distended. No pericholecystic fluid. Pancreas: No pancreatic mass. No pancreatic ductal dilatation. No pancreatic or peripancreatic fluid or inflammatory changes. Spleen: Unremarkable. Adrenals/Urinary Tract: Bilateral kidneys and bilateral adrenal glands are normal in appearance. No hydroureteronephrosis. Urinary bladder is normal in appearance. Stomach/Bowel: Normal appearance of the stomach. No pathologic dilatation of small bowel or colon. Normal appendix. Vascular/Lymphatic: Aortic atherosclerosis, without evidence of aneurysm or dissection in the abdominal or pelvic vasculature. No lymphadenopathy noted in the abdomen or pelvis. Reproductive: Prostate gland and seminal vesicles are  unremarkable in appearance. Other: Some fluid is noted tracking along the lower aspect of the right side of the retroperitoneum and along the right pelvic sidewall. There is also trace volume of ascites. No pneumoperitoneum. Musculoskeletal: There are no aggressive appearing  lytic or blastic lesions noted in the visualized portions of the skeleton. Review of the MIP images confirms the above findings. IMPRESSION: 1. Limited study, which demonstrates no evidence of central, lobar or proximal segmental sized pulmonary embolism. Smaller distal segmental and subsegmental sized emboli cannot be entirely excluded secondary to respiratory motion. 2. Cardiomegaly with evidence of interstitial pulmonary edema and small bilateral pleural effusions suggesting congestive heart failure. 3. Small amount of ascites. Small volume of fluid tracking along the right retroperitoneum into the right side of the pelvis. Gallbladder wall edema. These findings are all favored to be related to underlying hepatic congestion in the setting of congestive heart failure. There are no calcified gallstones. Additionally, the gallbladder does not appear overly distended, and is not surrounded by overt inflammatory changes to suggest a cholecystitis at this time. 4. Aortic atherosclerosis, in addition to 2 vessel coronary artery disease. Please note that although the presence of coronary artery calcium documents the presence of coronary artery disease, the severity of this disease and any potential stenosis cannot be assessed on this non-gated CT examination. Assessment for potential risk factor modification, dietary therapy or pharmacologic therapy may be warranted, if clinically indicated. Electronically Signed   By: Trudie Reed M.D.   On: 12/11/2017 12:00    Microbiology: No results found for this or any previous visit (from the past 240 hour(s)).   Labs: Basic Metabolic Panel: Recent Labs  Lab 12/12/17 0157 12/13/17 0416  NA 141  141  K 4.3 4.1  CL 102 104  CO2 27 28  GLUCOSE 146* 132*  BUN 20 24*  CREATININE 1.31* 1.12  CALCIUM 9.3 9.0   Liver Function Tests: No results for input(s): AST, ALT, ALKPHOS, BILITOT, PROT, ALBUMIN in the last 168 hours. No results for input(s): LIPASE, AMYLASE in the last 168 hours. No results for input(s): AMMONIA in the last 168 hours. CBC: Recent Labs  Lab 12/13/17 0416  WBC 12.9*  HGB 16.0  HCT 48.7  MCV 91.5  PLT 315   Cardiac Enzymes: Recent Labs  Lab 12/11/17 1450 12/11/17 2029 12/12/17 0157  TROPONINI 0.04* 0.04* 0.04*   BNP: BNP (last 3 results) Recent Labs    12/11/17 0914  BNP 863.3*    ProBNP (last 3 results) No results for input(s): PROBNP in the last 8760 hours.  CBG: No results for input(s): GLUCAP in the last 168 hours.     Signed:  Briant Cedar, MD [facility] 12/18/2017, 9:30 AM

## 2018-01-14 ENCOUNTER — Emergency Department (HOSPITAL_COMMUNITY)
Admission: EM | Admit: 2018-01-14 | Discharge: 2018-01-14 | Disposition: A | Discharge status: Home / Self Care | Payer: Medicaid Other | Attending: Emergency Medicine | Admitting: Emergency Medicine

## 2018-01-14 ENCOUNTER — Emergency Department (HOSPITAL_COMMUNITY): Payer: Medicaid Other

## 2018-01-14 DIAGNOSIS — J449 Chronic obstructive pulmonary disease, unspecified: Secondary | ICD-10-CM | POA: Insufficient documentation

## 2018-01-14 DIAGNOSIS — Z79899 Other long term (current) drug therapy: Secondary | ICD-10-CM | POA: Diagnosis not present

## 2018-01-14 DIAGNOSIS — R0789 Other chest pain: Secondary | ICD-10-CM

## 2018-01-14 DIAGNOSIS — I509 Heart failure, unspecified: Secondary | ICD-10-CM | POA: Diagnosis not present

## 2018-01-14 DIAGNOSIS — R0602 Shortness of breath: Secondary | ICD-10-CM | POA: Insufficient documentation

## 2018-01-14 DIAGNOSIS — I110 Hypertensive heart disease with heart failure: Secondary | ICD-10-CM | POA: Insufficient documentation

## 2018-01-14 DIAGNOSIS — F17210 Nicotine dependence, cigarettes, uncomplicated: Secondary | ICD-10-CM | POA: Insufficient documentation

## 2018-01-14 DIAGNOSIS — Z7982 Long term (current) use of aspirin: Secondary | ICD-10-CM | POA: Diagnosis not present

## 2018-01-14 DIAGNOSIS — R079 Chest pain, unspecified: Secondary | ICD-10-CM | POA: Diagnosis present

## 2018-01-14 LAB — COMPREHENSIVE METABOLIC PANEL
ALK PHOS: 63 U/L (ref 38–126)
ALT: 31 U/L (ref 0–44)
AST: 31 U/L (ref 15–41)
Albumin: 3.8 g/dL (ref 3.5–5.0)
Anion gap: 9 (ref 5–15)
BUN: 23 mg/dL — ABNORMAL HIGH (ref 6–20)
CALCIUM: 8.8 mg/dL — AB (ref 8.9–10.3)
CHLORIDE: 103 mmol/L (ref 98–111)
CO2: 22 mmol/L (ref 22–32)
CREATININE: 1.31 mg/dL — AB (ref 0.61–1.24)
GFR, EST NON AFRICAN AMERICAN: 58 mL/min — AB (ref >60)
Glucose, Bld: 127 mg/dL — ABNORMAL HIGH (ref 70–99)
Potassium: 4.2 mmol/L (ref 3.5–5.1)
Sodium: 134 mmol/L — ABNORMAL LOW (ref 135–145)
Total Bilirubin: 0.4 mg/dL (ref 0.3–1.2)
Total Protein: 6.4 g/dL — ABNORMAL LOW (ref 6.5–8.1)

## 2018-01-14 LAB — CBC WITH DIFFERENTIAL/PLATELET
Abs Immature Granulocytes: 0 10*3/uL (ref 0.0–0.1)
BASOS PCT: 1 %
Basophils Absolute: 0 10*3/uL (ref 0.0–0.1)
EOS ABS: 0.2 10*3/uL (ref 0.0–0.7)
Eosinophils Relative: 4 %
HCT: 48.8 % (ref 39.0–52.0)
Hemoglobin: 15.9 g/dL (ref 13.0–17.0)
Immature Granulocytes: 0 %
Lymphocytes Relative: 36 %
Lymphs Abs: 2.2 10*3/uL (ref 0.7–4.0)
MCH: 29.4 pg (ref 26.0–34.0)
MCHC: 32.6 g/dL (ref 30.0–36.0)
MCV: 90.4 fL (ref 78.0–100.0)
MONO ABS: 0.5 10*3/uL (ref 0.1–1.0)
MONOS PCT: 8 %
Neutro Abs: 3.1 10*3/uL (ref 1.7–7.7)
Neutrophils Relative %: 51 %
PLATELETS: 195 10*3/uL (ref 150–400)
RBC: 5.4 MIL/uL (ref 4.22–5.81)
RDW: 13.2 % (ref 11.5–15.5)
WBC: 6 10*3/uL (ref 4.0–10.5)

## 2018-01-14 LAB — PROTIME-INR
INR: 0.92
Prothrombin Time: 12.3 seconds (ref 11.4–15.2)

## 2018-01-14 LAB — MAGNESIUM: MAGNESIUM: 2.3 mg/dL (ref 1.7–2.4)

## 2018-01-14 LAB — BRAIN NATRIURETIC PEPTIDE: B Natriuretic Peptide: 89.6 pg/mL (ref 0.0–100.0)

## 2018-01-14 LAB — TROPONIN I

## 2018-01-14 MED ORDER — ASPIRIN 81 MG PO CHEW
324.0000 mg | CHEWABLE_TABLET | Freq: Once | ORAL | Status: DC
  Filled 2018-01-14: qty 4

## 2018-01-14 MED ORDER — PANTOPRAZOLE SODIUM 20 MG PO TBEC: 20.0000 mg | DELAYED_RELEASE_TABLET | Freq: Every day | ORAL | 0 refills | Status: DC

## 2018-01-14 MED ORDER — OXYCODONE-ACETAMINOPHEN 5-325 MG PO TABS: 1.0000 | ORAL_TABLET | ORAL | 0 refills | Status: DC | PRN

## 2018-01-14 MED ORDER — LISINOPRIL-HYDROCHLOROTHIAZIDE 20-25 MG PO TABS: 1.0000 | ORAL_TABLET | Freq: Every day | ORAL | 0 refills | Status: DC

## 2018-01-14 MED ORDER — PREDNISONE 20 MG PO TABS
60.0000 mg | ORAL_TABLET | Freq: Once | ORAL | Status: AC
  Administered 2018-01-14: 60 mg via ORAL
  Filled 2018-01-14: qty 3

## 2018-01-14 MED ORDER — ALBUTEROL SULFATE HFA 108 (90 BASE) MCG/ACT IN AERS
2.0000 | INHALATION_SPRAY | Freq: Once | RESPIRATORY_TRACT | Status: AC
  Administered 2018-01-14: 2 via RESPIRATORY_TRACT
  Filled 2018-01-14: qty 6.7

## 2018-01-14 MED ORDER — PREDNISONE 20 MG PO TABS: 40.0000 mg | ORAL_TABLET | Freq: Every day | ORAL | 0 refills | Status: DC

## 2018-01-14 MED ORDER — GABAPENTIN 400 MG PO CAPS: 400.0000 mg | ORAL_CAPSULE | Freq: Every day | ORAL | 0 refills | Status: DC

## 2018-01-14 MED ORDER — ALBUTEROL SULFATE (2.5 MG/3ML) 0.083% IN NEBU
5.0000 mg | INHALATION_SOLUTION | Freq: Once | RESPIRATORY_TRACT | Status: AC
  Administered 2018-01-14: 5 mg via RESPIRATORY_TRACT
  Filled 2018-01-14: qty 6

## 2018-01-14 NOTE — Discharge Instructions (Signed)
In addition to the provided albuterol, please take the prescribed steroids for the next 4 days per Be sure to follow-up with a primary care physician, and at the heart failure clinic.  Return here for concerning changes in your condition.

## 2018-01-14 NOTE — ED Triage Notes (Signed)
Pt arrived via [facility] from home c/o chest pain. Pt recently left [facility] AMA for same. Has cardiac hx but unable to provide details other than recalling a low ED (25-30%, per EMS) Pt is alert and oriented. Pt rated pain 10/10 w/ EMS. 324 ASA and 1 SL nitro given PTA. Pain slightly relieved but nitro dropped BP to 102/75. Pt Stated he coughed and "passed out" yesterday, striking his head on the concrete floor. Small hematoma noted to back of head. Pt denies blood thinners.

## 2018-01-14 NOTE — ED Provider Notes (Signed)
[facility] EMERGENCY DEPARTMENT Provider Note   CSN: [id number] Arrival date & time: 01/14/18  1610     History   Chief Complaint Chief Complaint  Patient presents with  . Chest Pain    HPI Allen Mayer is a 57 y.o. male.  HPI  Patient presents with concern of chest pain, dyspnea, cough. Patient has seemingly been ill for at least 1 month, including prior to hospitalization 1 month ago. It is unclear when this condition became worse, but it seems as though over the past few days the patient is more dyspneic, with more anterior chest tightness and he had been. Patient is accompanied by his wife who assist with the HPI. They are not great historians, but it seems that the patient was diagnosed with cardiomyopathy, had coronary angiography during his last hospitalization, and they acknowledge that the patient left AMA prior to completing his therapy. Patient smokes daily, drinks alcohol. No fever, no obvious weight gain or weight loss, no confusion. Patient may have had episodes of syncope during this past month.   Past Medical History:  Diagnosis Date  . Acid reflux   . Arthritis   . Asthma   . Back pain   . Bronchitis   . COPD (chronic obstructive pulmonary disease) (HCC)   . Hypertension   . Irregular heart beat   . Neuropathic pain   . Schizo affective schizophrenia [facility])     Patient Active Problem List   Diagnosis Date Noted  . SOB (shortness of breath)   . Coronary artery calcification seen on CAT scan   . DCM (dilated cardiomyopathy) (HCC)   . Acute CHF (congestive heart failure) (HCC) 12/11/2017  . Elevated troponin 12/11/2017  . COPD (chronic obstructive pulmonary disease) (HCC) 12/11/2017  . 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 Tobacco abuse 11/19/2014  . Essential hypertension, benign 11/19/2014  . Neuropathic pain     Past Surgical History:  Procedure Laterality Date  .  None    . RIGHT/LEFT HEART CATH AND CORONARY ANGIOGRAPHY N/A 12/13/2017   Procedure: RIGHT/LEFT HEART CATH AND CORONARY ANGIOGRAPHY;  Surgeon: Runell Gess, MD;  Location: [facility] INVASIVE CV LAB;  Service: Cardiovascular;  Laterality: N/A;        Home Medications    Prior to Admission medications   Medication Sig Start Date End Date Taking? Authorizing Provider  albuterol (PROVENTIL HFA;VENTOLIN HFA) 108 (90 Base) MCG/ACT inhaler Inhale 2 puffs into the lungs every 4 (four) hours as needed for wheezing or shortness of breath. 06/22/17   Pisciotta, Mardella Layman  aspirin EC 81 MG tablet Take 81 mg by mouth daily.    [provider]  beclomethasone (QVAR) 80 MCG/ACT inhaler Inhale 1 puff into the lungs 2 (two) times daily as needed. 06/22/17   Pisciotta, Joni Reining, PA-C  benztropine (COGENTIN) 1 MG tablet Take 1 tablet (1 mg total) by mouth at bedtime. 06/22/17   Pisciotta, Joni Reining, PA-C  citalopram (CELEXA) 20 MG tablet Take 1 tablet (20 mg total) by mouth daily. 06/22/17   Pisciotta, Joni Reining, PA-C  colchicine 0.6 MG tablet Take 0.6 mg by mouth as needed.    [provider]  gabapentin (NEURONTIN) 400 MG capsule Take 400 mg by mouth at bedtime.    [provider]  lisinopril-hydrochlorothiazide (PRINZIDE,ZESTORETIC) 20-25 MG tablet Take 1 tablet by mouth daily. 06/22/17   Pisciotta, Joni Reining, PA-C  nitroGLYCERIN (NITROSTAT) 0.4 MG SL tablet Place 0.4 mg under the tongue every 5 (  five) minutes as needed for chest pain.    [provider]  paliperidone (INVEGA) 6 MG 24 hr tablet Take 1 tablet (6 mg total) by mouth daily. 06/22/17   Pisciotta, Joni Reining, PA-C  pantoprazole (PROTONIX) 20 MG tablet Take 20 mg by mouth daily.    [provider]    Family History Family History  Problem Relation Age of Onset  . Heart attack Mother        Died age 13  . Heart attack Brother        104    Social History Social History   Tobacco Use  . Smoking status: Current  Every Day Smoker    Packs/day: 0.50  . Smokeless tobacco: Never Used  Substance Use Topics  . Alcohol use: Yes    Comment: occas  . Drug use: No    Types: Cocaine, Marijuana     Allergies   Patient has no known allergies.   Review of Systems Review of Systems  Constitutional:       Per HPI, otherwise negative  HENT:       Per HPI, otherwise negative  Respiratory:       Per HPI, otherwise negative  Cardiovascular:       Per HPI, otherwise negative  Gastrointestinal: Negative for vomiting.  Endocrine:       Negative aside from HPI  Genitourinary:       Neg aside from HPI   Musculoskeletal:       Per HPI, otherwise negative  Skin: Negative.   Neurological: Positive for weakness. Negative for syncope.     Physical Exam Updated Vital Signs BP 124/70 (BP Location: Right Arm) Comment: Simultaneous filing. User may not have seen previous data.  Pulse 84 Comment: Simultaneous filing. User may not have seen previous data.  Temp 98.5 F (36.9 C) (Oral)   Resp 18 Comment: Simultaneous filing. User may not have seen previous data.  SpO2 98% Comment: Simultaneous filing. User may not have seen previous data.  Physical Exam  Constitutional: He is oriented to person, place, and time. He appears well-developed. No distress.  HENT:  Head: Normocephalic and atraumatic.  Eyes: Conjunctivae and EOM are normal.  Cardiovascular: Normal rate and regular rhythm.  Pulmonary/Chest: He has wheezes.  Abdominal: He exhibits no distension.  Musculoskeletal: He exhibits no edema.  Neurological: He is alert and oriented to person, place, and time.  Skin: Skin is warm and dry.  Psychiatric: He has a normal mood and affect.  Nursing note and vitals reviewed.    ED Treatments / Results  Labs (all labs ordered are listed, but only abnormal results are displayed) Labs Reviewed  COMPREHENSIVE METABOLIC PANEL - Abnormal; Notable for the following components:      Result Value   Sodium 134  (*)    Glucose, Bld 127 (*)    BUN 23 (*)    Creatinine, Ser 1.31 (*)    Calcium 8.8 (*)    Total Protein 6.4 (*)    GFR calc non Af Amer 58 (*)    All other components within normal limits  MAGNESIUM  TROPONIN I  BRAIN NATRIURETIC PEPTIDE  CBC WITH DIFFERENTIAL/PLATELET  PROTIME-INR  CBG MONITORING, ED    EKG EKG Interpretation  Date/Time:  Sunday January 14 2018 16:13:10 EDT Ventricular Rate:  75 PR Interval:    QRS Duration: 85 QT Interval:  416 QTC Calculation: 465 R Axis:   -14 Text Interpretation:  Sinus rhythm Left ventricular hypertrophy Anteroseptal  infarct, old T wave abnormality No significant change since last tracing Abnormal ekg Confirmed by Gerhard Munch [phone]) on 01/14/2018 4:22:16 PM   Radiology Dg Chest Portable 1 View  Result Date: 01/14/2018 CLINICAL DATA:  Left chest pain, shortness of breath EXAM: PORTABLE CHEST 1 VIEW COMPARISON:  CTA chest dated 12/11/2017 FINDINGS: Mild right basilar atelectasis. No focal consolidation. No pleural effusion or pneumothorax. The heart is normal in size. IMPRESSION: No evidence of acute cardiopulmonary disease. Electronically Signed   By: Charline Bills M.D.   On: 01/14/2018 16:55    Procedures Procedures (including critical care time)  Medications Ordered in ED Medications  aspirin chewable tablet 324 mg (has no administration in time range)  albuterol (PROVENTIL) (2.5 MG/3ML) 0.083% nebulizer solution 5 mg (has no administration in time range)     Initial Impression / Assessment and Plan / ED Course  I have reviewed the triage vital signs and the nursing notes.  Pertinent labs & imaging results that were available during my care of the patient were reviewed by me and considered in my medical decision making (see chart for details).    Chart review after the initial evaluation notable for catheterization and echocardiogram 1 month ago, results below:  Study Conclusions (ECHO) 12/11/17   - Left  ventricle: The cavity size was mildly dilated. Wall   thickness was normal. Systolic function was severely reduced. The   estimated ejection fraction was in the range of 25% to 30%.   Diffuse hypokinesis. Doppler parameters are consistent with a   reversible restrictive pattern, indicative of decreased left   ventricular diastolic compliance and/or increased left atrial   pressure (grade 3 diastolic dysfunction). - Right ventricle: Systolic function was mildly to moderately   reduced. - Right atrium: The atrium was mildly to moderately dilated.   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  Probably a combination of poorly controlled hypertension and ethanol abuse.  He probably would require additional diuresis and optimal medical therapy.  He may ultimately require an ICD for primary prevention.  A right femoral angiogram was performed and a MYNX closure device was successfully deployed achieving hemostasis.  The patient left the lab in stable condition.   Nanetta Batty. MD, [facility] 12/13/2017 4:55 PM  7:33 PM Patient awake and alert, no hypoxia, no distress, findings reassuring, no evidence for decompensated heart failure, nor ACS. Some suspicion for COPD exacerbation given the patient's ongoing smoking, and his history. Patient received bronchodilator, steroids, was discharged with a refill of most home medications, per request, with outpatient psychiatry and outpatient follow-up with heart failure clinic.   Final Clinical Impressions(s) / ED Diagnoses   Shortness of breath   Gerhard Munch, MD 01/14/18 1934

## 2019-08-26 ENCOUNTER — Encounter: Payer: Self-pay | Admitting: *Deleted

## 2019-08-26 ENCOUNTER — Telehealth: Payer: Self-pay | Admitting: *Deleted

## 2019-08-26 NOTE — Telephone Encounter (Signed)
Pt has appointment May 27th. Requested medication be called into [facility].

## 2019-08-26 NOTE — Congregational Nurse Program (Signed)
  Dept: [phone]   Congregational Nurse Program Note  Date of Encounter: 08/26/2019  Past Medical History: Past Medical History:  Diagnosis Date  . Acid reflux   . Arthritis   . Asthma   . Back pain   . Bronchitis   . COPD (chronic obstructive pulmonary disease) (HCC)   . Hypertension   . Irregular heart beat   . Neuropathic pain   . Schizo affective schizophrenia [facility])     Encounter Details: CNP Questionnaire - 08/26/19 1446      Questionnaire   Patient Status  Not Applicable    Race  Black or African American    Location Patient Served At  [employer]    Uninsured  Not Applicable    Food  Yes, have food insecurities    Housing/Utilities  No permanent housing    Transportation  Provided transportation assistance (bus pass, taxi voucher, etc.)   provided bus pass for pharmacy pick up   Interpersonal Safety  No, do not feel physically and emotionally safe where you currently live    Medication  Yes, have medication insecurities   Contacted provider for call in medications to pharmacy   Medical Provider  Yes    Referrals  Primary Care Provider/Clinic    ED Visit Averted  Not Applicable    Life-Saving Intervention Made  Not Applicable      Client requested blood pressure check 141/87. He reports he has no medication at this time. Contacted [facility]. Client has an appointment May 27th. Requested provider call [facility] with prescriptions as pt has COPD with chronic cough. Provided bus passes for transportation. Dymond Spreen W. RN [phone]

## 2019-09-02 ENCOUNTER — Encounter (HOSPITAL_COMMUNITY): Payer: Self-pay | Admitting: Emergency Medicine

## 2019-09-02 ENCOUNTER — Other Ambulatory Visit: Payer: Self-pay

## 2019-09-02 ENCOUNTER — Emergency Department (HOSPITAL_COMMUNITY)
Admission: EM | Admit: 2019-09-02 | Discharge: 2019-09-02 | Disposition: A | Discharge status: Home / Self Care | Payer: Medicaid Other | Attending: Emergency Medicine | Admitting: Emergency Medicine

## 2019-09-02 ENCOUNTER — Emergency Department (HOSPITAL_COMMUNITY): Payer: Medicaid Other

## 2019-09-02 DIAGNOSIS — I2510 Atherosclerotic heart disease of native coronary artery without angina pectoris: Secondary | ICD-10-CM | POA: Diagnosis not present

## 2019-09-02 DIAGNOSIS — Z79899 Other long term (current) drug therapy: Secondary | ICD-10-CM | POA: Diagnosis not present

## 2019-09-02 DIAGNOSIS — F17210 Nicotine dependence, cigarettes, uncomplicated: Secondary | ICD-10-CM | POA: Insufficient documentation

## 2019-09-02 DIAGNOSIS — R0602 Shortness of breath: Secondary | ICD-10-CM | POA: Diagnosis present

## 2019-09-02 DIAGNOSIS — J441 Chronic obstructive pulmonary disease with (acute) exacerbation: Secondary | ICD-10-CM | POA: Insufficient documentation

## 2019-09-02 DIAGNOSIS — I10 Essential (primary) hypertension: Secondary | ICD-10-CM | POA: Insufficient documentation

## 2019-09-02 LAB — BASIC METABOLIC PANEL
Anion gap: 13 (ref 5–15)
BUN: 16 mg/dL (ref 6–20)
CO2: 23 mmol/L (ref 22–32)
Calcium: 9.7 mg/dL (ref 8.9–10.3)
Chloride: 103 mmol/L (ref 98–111)
Creatinine, Ser: 1.31 mg/dL — ABNORMAL HIGH (ref 0.61–1.24)
GFR calc Af Amer: >60 mL/min (ref >60)
GFR calc non Af Amer: 59 mL/min — ABNORMAL LOW (ref >60)
Glucose, Bld: 111 mg/dL — ABNORMAL HIGH (ref 70–99)
Potassium: 4.6 mmol/L (ref 3.5–5.1)
Sodium: 139 mmol/L (ref 135–145)

## 2019-09-02 LAB — CBC
HCT: 45.5 % (ref 39.0–52.0)
Hemoglobin: 14.1 g/dL (ref 13.0–17.0)
MCH: 29 pg (ref 26.0–34.0)
MCHC: 31 g/dL (ref 30.0–36.0)
MCV: 93.6 fL (ref 80.0–100.0)
Platelets: 212 10*3/uL (ref 150–400)
RBC: 4.86 MIL/uL (ref 4.22–5.81)
RDW: 14.4 % (ref 11.5–15.5)
WBC: 6.3 10*3/uL (ref 4.0–10.5)
nRBC: 0 % (ref 0.0–0.2)

## 2019-09-02 LAB — HEPATIC FUNCTION PANEL
ALT: 67 U/L — ABNORMAL HIGH (ref 0–44)
AST: 44 U/L — ABNORMAL HIGH (ref 15–41)
Albumin: 3.4 g/dL — ABNORMAL LOW (ref 3.5–5.0)
Alkaline Phosphatase: 49 U/L (ref 38–126)
Bilirubin, Direct: 0.1 mg/dL (ref 0.0–0.2)
Indirect Bilirubin: 0.7 mg/dL (ref 0.3–0.9)
Total Bilirubin: 0.8 mg/dL (ref 0.3–1.2)
Total Protein: 5.9 g/dL — ABNORMAL LOW (ref 6.5–8.1)

## 2019-09-02 LAB — LIPASE, BLOOD: Lipase: 31 U/L (ref 11–51)

## 2019-09-02 LAB — TROPONIN I (HIGH SENSITIVITY)
Troponin I (High Sensitivity): 32 ng/L — ABNORMAL HIGH (ref <18)
Troponin I (High Sensitivity): 27 ng/L — ABNORMAL HIGH (ref <18)

## 2019-09-02 MED ORDER — ALBUTEROL SULFATE HFA 108 (90 BASE) MCG/ACT IN AERS
8.0000 | INHALATION_SPRAY | Freq: Once | RESPIRATORY_TRACT | Status: AC
  Administered 2019-09-02: 8 via RESPIRATORY_TRACT
  Filled 2019-09-02: qty 6.7

## 2019-09-02 MED ORDER — PREDNISONE 20 MG PO TABS
ORAL_TABLET | ORAL | 0 refills | Status: DC
Start: 2019-09-02 — End: 2019-11-02

## 2019-09-02 MED ORDER — SODIUM CHLORIDE 0.9% FLUSH
3.0000 mL | Freq: Once | INTRAVENOUS | Status: AC
  Administered 2019-09-02: 3 mL via INTRAVENOUS

## 2019-09-02 MED ORDER — ACETAMINOPHEN 500 MG PO TABS
1000.0000 mg | ORAL_TABLET | Freq: Once | ORAL | Status: AC
  Administered 2019-09-02: 1000 mg via ORAL
  Filled 2019-09-02: qty 2

## 2019-09-02 MED ORDER — OXYCODONE HCL 5 MG PO TABS
10.0000 mg | ORAL_TABLET | Freq: Once | ORAL | Status: AC
  Administered 2019-09-02: 10 mg via ORAL
  Filled 2019-09-02: qty 2

## 2019-09-02 MED ORDER — AMOXICILLIN-POT CLAVULANATE 875-125 MG PO TABS
1.0000 | ORAL_TABLET | Freq: Two times a day (BID) | ORAL | 0 refills | Status: DC
Start: 2019-09-02 — End: 2019-11-02

## 2019-09-02 NOTE — ED Notes (Signed)
Patient verbalizes understanding of discharge instructions. Opportunity for questioning and answers were provided. Pt discharged from ED. 

## 2019-09-02 NOTE — ED Provider Notes (Signed)
[facility] EMERGENCY DEPARTMENT Provider Note   CSN: [id number] Arrival date & time: 09/02/19  0430     History Chief Complaint  Patient presents with   Shortness of Breath   Chest Pain    Calder Oblinger is a 59 y.o. male.  59 yo M with a cc of sob.  Going on for the past week or so. Increased cough sputum and change in sputum.  Felt like he was having sharp pain to his mid chest and just underneath the epigastrium.  He is given breathing treatments with EMS and since then feels much better.  He also has swelling to his right elbow has been going on for weeks now.  Also complaining of degenerative joint disease that he has run out of his oxycodone's for.  He denies overt fevers but has had some chills and sweats.  States has had some epigastric abdominal pain off and on for the past few weeks to a month.  The history is provided by the patient.  Shortness of Breath Severity:  Moderate Onset quality:  Gradual Duration:  1 week Timing:  Constant Progression:  Worsening Chronicity:  New Relieved by:  Nothing Worsened by:  Nothing Ineffective treatments:  None tried Associated symptoms: chest pain   Associated symptoms: no abdominal pain, no fever, no headaches, no rash and no vomiting   Chest Pain Associated symptoms: shortness of breath   Associated symptoms: no abdominal pain, no fever, no headache, no palpitations and no vomiting        Past Medical History:  Diagnosis Date   Acid reflux    Arthritis    Asthma    Back pain    Bronchitis    COPD (chronic obstructive pulmonary disease) (HCC)    Hypertension    Irregular heart beat    Neuropathic pain    Schizo affective schizophrenia [facility])     Patient Active Problem List   Diagnosis Date Noted   SOB (shortness of breath)    Coronary artery calcification seen on CAT scan    DCM (dilated cardiomyopathy) (HCC)    Acute CHF (congestive heart failure) (HCC) 12/11/2017    Elevated troponin 12/11/2017   COPD (chronic obstructive pulmonary disease) (HCC) 12/11/2017   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) 11/19/2014   Tobacco abuse 11/19/2014   Essential hypertension, benign 11/19/2014   Neuropathic pain     Past Surgical History:  Procedure Laterality Date   None     RIGHT/LEFT HEART CATH AND CORONARY ANGIOGRAPHY N/A 12/13/2017   Procedure: RIGHT/LEFT HEART CATH AND CORONARY ANGIOGRAPHY;  Surgeon: Runell Gess, MD;  Location: [facility] INVASIVE CV LAB;  Service: Cardiovascular;  Laterality: N/A;       Family History  Problem Relation Age of Onset   Heart attack Mother        Died age 38   Heart attack Brother        16    Social History   Tobacco Use   Smoking status: Current Every Day Smoker    Packs/day: 0.50   Smokeless tobacco: Never Used  Substance Use Topics   Alcohol use: Yes    Comment: occas   Drug use: No    Types: Cocaine, Marijuana    Home Medications Prior to Admission medications   Medication Sig Start Date End Date Taking? Authorizing Provider  albuterol (PROVENTIL HFA;VENTOLIN HFA) 108 (90 Base) MCG/ACT inhaler Inhale 2 puffs into the lungs every 4 (four) hours as  needed for wheezing or shortness of breath. 06/22/17  Yes Pisciotta, Joni Reining, PA-C  fluticasone (FLONASE) 50 MCG/ACT nasal spray Place 1 spray into both nostrils daily as needed for allergies or rhinitis.   Yes [provider]  gabapentin (NEURONTIN) 400 MG capsule Take 1 capsule (400 mg total) by mouth at bedtime. Patient taking differently: Take 400 mg by mouth daily as needed (nerve pain).  01/14/18  Yes Gerhard Munch, MD  lisinopril-hydrochlorothiazide (PRINZIDE,ZESTORETIC) 20-25 MG tablet Take 1 tablet by mouth daily. 01/14/18  Yes Gerhard Munch, MD  nitroGLYCERIN (NITROSTAT) 0.4 MG SL tablet Place 0.4 mg under the tongue every 5 (five) minutes as needed for chest pain.   Yes [provider]   oxyCODONE-acetaminophen (PERCOCET) 10-325 MG tablet Take 1 tablet by mouth every 6 (six) hours as needed for pain.   Yes [provider]  amoxicillin-clavulanate (AUGMENTIN) 875-125 MG tablet Take 1 tablet by mouth every 12 (twelve) hours. 09/02/19   Melene Plan, DO  beclomethasone (QVAR) 80 MCG/ACT inhaler Inhale 1 puff into the lungs 2 (two) times daily as needed. Patient not taking: Reported on 09/02/2019 06/22/17   Pisciotta, Joni Reining, PA-C  benztropine (COGENTIN) 1 MG tablet Take 1 tablet (1 mg total) by mouth at bedtime. Patient not taking: Reported on 09/02/2019 06/22/17   Pisciotta, Joni Reining, PA-C  citalopram (CELEXA) 20 MG tablet Take 1 tablet (20 mg total) by mouth daily. Patient not taking: Reported on 09/02/2019 06/22/17   Pisciotta, Joni Reining, PA-C  colchicine 0.6 MG tablet Take 0.6 mg by mouth as needed.    [provider]  oxyCODONE-acetaminophen (PERCOCET/ROXICET) 5-325 MG tablet Take 1 tablet by mouth every 4 (four) hours as needed for severe pain. Patient not taking: Reported on 09/02/2019 01/14/18   Gerhard Munch, MD  paliperidone (INVEGA) 6 MG 24 hr tablet Take 1 tablet (6 mg total) by mouth daily. Patient not taking: Reported on 09/02/2019 06/22/17   Pisciotta, Joni Reining, PA-C  pantoprazole (PROTONIX) 20 MG tablet Take 1 tablet (20 mg total) by mouth daily. Patient not taking: Reported on 09/02/2019 01/14/18   Gerhard Munch, MD  predniSONE (DELTASONE) 20 MG tablet 2 tabs po daily x 4 days 09/02/19   Melene Plan, DO    Allergies    Patient has no known allergies.  Review of Systems   Review of Systems  Constitutional: Negative for chills and fever.  HENT: Negative for congestion and facial swelling.   Eyes: Negative for discharge and visual disturbance.  Respiratory: Positive for shortness of breath.   Cardiovascular: Positive for chest pain. Negative for palpitations.  Gastrointestinal: Negative for abdominal pain, diarrhea and vomiting.  Musculoskeletal: Negative  for arthralgias and myalgias.  Skin: Negative for color change and rash.  Neurological: Negative for tremors, syncope and headaches.  Psychiatric/Behavioral: Negative for confusion and dysphoric mood.    Physical Exam Updated Vital Signs BP (!) 143/93    Pulse 76    Temp 98.2 F (36.8 C) (Oral)    Resp 19    Wt 93.7 kg    SpO2 93%    BMI 28.02 kg/m   Physical Exam Vitals and nursing note reviewed.  Constitutional:      Appearance: He is well-developed.  HENT:     Head: Normocephalic and atraumatic.  Eyes:     Pupils: Pupils are equal, round, and reactive to light.  Neck:     Vascular: No JVD.  Cardiovascular:     Rate and Rhythm: Normal rate and regular rhythm.  Heart sounds: No murmur. No friction rub. No gallop.   Pulmonary:     Effort: No respiratory distress.     Breath sounds: Wheezing present.  Abdominal:     General: There is no distension.     Tenderness: There is no guarding or rebound.  Musculoskeletal:        General: Normal range of motion.     Cervical back: Normal range of motion and neck supple.     Comments: Full range of motion of the right elbow.  There are some mild soft tissue swelling just above the elbow.  No obvious swelling at the olecranon.  No intra-articular edema.  Pulse motor and sensation intact distally.  Skin:    Coloration: Skin is not pale.     Findings: No rash.  Neurological:     Mental Status: He is alert and oriented to person, place, and time.  Psychiatric:        Behavior: Behavior normal.     ED Results / Procedures / Treatments   Labs (all labs ordered are listed, but only abnormal results are displayed) Labs Reviewed  BASIC METABOLIC PANEL - Abnormal; Notable for the following components:      Result Value   Glucose, Bld 111 (*)    Creatinine, Ser 1.31 (*)    GFR calc non Af Amer 59 (*)    All other components within normal limits  HEPATIC FUNCTION PANEL - Abnormal; Notable for the following components:   Total  Protein 5.9 (*)    Albumin 3.4 (*)    AST 44 (*)    ALT 67 (*)    All other components within normal limits  TROPONIN I (HIGH SENSITIVITY) - Abnormal; Notable for the following components:   Troponin I (High Sensitivity) 32 (*)    All other components within normal limits  TROPONIN I (HIGH SENSITIVITY) - Abnormal; Notable for the following components:   Troponin I (High Sensitivity) 27 (*)    All other components within normal limits  CBC  LIPASE, BLOOD    EKG EKG Interpretation  Date/Time:  Monday Sep 02 2019 04:27:52 EDT Ventricular Rate:  88 PR Interval:  180 QRS Duration: 92 QT Interval:  396 QTC Calculation: 479 R Axis:   -44 Text Interpretation: Normal sinus rhythm Left axis deviation Minimal voltage criteria for LVH, may be normal variant ( Cornell product ) Septal infarct , age undetermined Abnormal ECG t waves in inferior and lateral leads now resolved Otherwise no significant change Confirmed by Melene Plan [phone]) on 09/02/2019 7:34:44 AM   Radiology DG Chest 2 View  Result Date: 09/02/2019 CLINICAL DATA:  Shortness of breath and chest pressure EXAM: CHEST - 2 VIEW COMPARISON:  Radiograph 01/14/2018, CT 12/11/2017 FINDINGS: Diffusely increased fine reticular opacities throughout the lungs with more patchy consolidative opacity towards the lung bases and hila. There is fissural and septal thickening as well as cephalized pulmonary vascularity. Blunting of the costophrenic sulci, may reflect trace effusions. No visible pneumothorax. Cardiomegaly is more pronounced than on comparison exam. No acute osseous or soft tissue abnormality. Degenerative changes are present in the imaged spine and shoulders. IMPRESSION: Features most compatible with CHF with cardiomegaly, interstitial edema and likely trace effusions. More patchy opacity in the bases likely reflecting developing alveolar edema or atelectasis in the absence of infectious symptoms. Electronically Signed   By: Kreg Shropshire  M.D.   On: 09/02/2019 05:01    Procedures Procedures (including critical care time)  Medications Ordered in  ED Medications  sodium chloride flush (NS) 0.9 % injection 3 mL (3 mLs Intravenous Given 09/02/19 0803)  oxyCODONE (Oxy IR/ROXICODONE) immediate release tablet 10 mg (10 mg Oral Given 09/02/19 0820)  acetaminophen (TYLENOL) tablet 1,000 mg (1,000 mg Oral Given 09/02/19 0820)  albuterol (VENTOLIN HFA) 108 (90 Base) MCG/ACT inhaler 8 puff (8 puffs Inhalation Given 09/02/19 2637)    ED Course  I have reviewed the triage vital signs and the nursing notes.  Pertinent labs & imaging results that were available during my care of the patient were reviewed by me and considered in my medical decision making (see chart for details).    MDM Rules/Calculators/A&P                      59 yo M with a cc of sob.  The patient has a history of alcohol and cocaine abuse and has nonischemic heart failure with last EF measured between 25 and 30%.  By history most likely the patient has a COPD exacerbation.  Had symptoms improved significantly with breathing treatments and steroids.  Chest x-ray viewed by me with possible edema as well as possible infiltrates.  As the patient is describing some subjective fevers and chills and increased cough change in sputum will likely start on antibiotics.  Is requesting pain medicine for his degenerative joint disease we will give him 1 dose here.  He has had chronic swelling to the right elbow which he has an outpatient MRI ordered.  I do not see a reason for imaging here in the ED.    Second troponin is stable.  LFTs and lipase without a significant elevation.  Patient continues to be well.  We will discharge him home.  Have him follow-up with his family doctor in the office.  10:05 AM:  I have discussed the diagnosis/risks/treatment options with the patient and family and believe the pt to be eligible for discharge home to follow-up with PCP. We also discussed  returning to the ED immediately if new or worsening sx occur. We discussed the sx which are most concerning (e.g., sudden worsening sob, fever, inability to tolerate by mouth) that necessitate immediate return. Medications administered to the patient during their visit and any new prescriptions provided to the patient are listed below.  Medications given during this visit Medications  sodium chloride flush (NS) 0.9 % injection 3 mL (3 mLs Intravenous Given 09/02/19 0803)  oxyCODONE (Oxy IR/ROXICODONE) immediate release tablet 10 mg (10 mg Oral Given 09/02/19 0820)  acetaminophen (TYLENOL) tablet 1,000 mg (1,000 mg Oral Given 09/02/19 0820)  albuterol (VENTOLIN HFA) 108 (90 Base) MCG/ACT inhaler 8 puff (8 puffs Inhalation Given 09/02/19 8588)     The patient appears reasonably screen and/or stabilized for discharge and I doubt any other medical condition or other [facility] requiring further screening, evaluation, or treatment in the ED at this time prior to discharge.   Final Clinical Impression(s) / ED Diagnoses Final diagnoses:  COPD exacerbation ([place])    Rx / DC Orders ED Discharge Orders         Ordered    predniSONE (DELTASONE) 20 MG tablet     09/02/19 1004    amoxicillin-clavulanate (AUGMENTIN) 875-125 MG tablet  Every 12 hours     09/02/19 1004           Deno Etienne, DO 09/02/19 1005

## 2019-09-02 NOTE — ED Notes (Signed)
Took out saline lock patient is getting dress with family at bedside

## 2019-09-02 NOTE — Discharge Instructions (Signed)
Return for worsening shortness of breath.  Or if you need to use your rescue inhaler more often than every 4 hours.  Please follow-up with your family doctor in the office.

## 2019-09-02 NOTE — ED Notes (Signed)
Pt given food and beverage per Dr. Adela Lank

## 2019-09-02 NOTE — ED Triage Notes (Signed)
Pt in via [facility] with sob and chest pressure, began last night. States he took 1 NTG w/some relief. EMS reports wheezes present, pt given a duoneb and 125mg  Solumedrol en route. Pt also c/o cough and congestion, no fevers. sats 100% on RA

## 2019-09-02 NOTE — ED Notes (Signed)
No recent sick contacts. Covid vaccine 2 mo's ago

## 2019-09-13 ENCOUNTER — Emergency Department (HOSPITAL_COMMUNITY): Payer: Medicaid Other

## 2019-09-13 ENCOUNTER — Encounter (HOSPITAL_COMMUNITY): Payer: Self-pay | Admitting: Emergency Medicine

## 2019-09-13 ENCOUNTER — Emergency Department (HOSPITAL_COMMUNITY)
Admission: EM | Admit: 2019-09-13 | Discharge: 2019-09-14 | Discharge status: Against Medical Advice | Payer: Medicaid Other | Attending: Emergency Medicine | Admitting: Emergency Medicine

## 2019-09-13 ENCOUNTER — Other Ambulatory Visit: Payer: Self-pay

## 2019-09-13 DIAGNOSIS — Z5321 Procedure and treatment not carried out due to patient leaving prior to being seen by health care provider: Secondary | ICD-10-CM | POA: Diagnosis not present

## 2019-09-13 DIAGNOSIS — R109 Unspecified abdominal pain: Secondary | ICD-10-CM | POA: Diagnosis not present

## 2019-09-13 MED ORDER — SODIUM CHLORIDE 0.9% FLUSH: 3.0000 mL | Freq: Once | INTRAVENOUS | Status: DC

## 2019-09-13 NOTE — ED Triage Notes (Signed)
Pt c/o increase abd pain and sob for the past 3 days.

## 2019-09-14 LAB — URINALYSIS, ROUTINE W REFLEX MICROSCOPIC
Bilirubin Urine: NEGATIVE
Glucose, UA: NEGATIVE mg/dL
Hgb urine dipstick: NEGATIVE
Ketones, ur: NEGATIVE mg/dL
Nitrite: NEGATIVE
Protein, ur: NEGATIVE mg/dL
Specific Gravity, Urine: 1.013 (ref 1.005–1.030)
pH: 5 (ref 5.0–8.0)

## 2019-09-14 LAB — CBC
HCT: 44.7 % (ref 39.0–52.0)
Hemoglobin: 13.9 g/dL (ref 13.0–17.0)
MCH: 29.1 pg (ref 26.0–34.0)
MCHC: 31.1 g/dL (ref 30.0–36.0)
MCV: 93.5 fL (ref 80.0–100.0)
Platelets: 226 10*3/uL (ref 150–400)
RBC: 4.78 MIL/uL (ref 4.22–5.81)
RDW: 14.2 % (ref 11.5–15.5)
WBC: 6.2 10*3/uL (ref 4.0–10.5)
nRBC: 0 % (ref 0.0–0.2)

## 2019-09-14 LAB — BASIC METABOLIC PANEL
Anion gap: 10 (ref 5–15)
BUN: 20 mg/dL (ref 6–20)
CO2: 26 mmol/L (ref 22–32)
Calcium: 8.5 mg/dL — ABNORMAL LOW (ref 8.9–10.3)
Chloride: 103 mmol/L (ref 98–111)
Creatinine, Ser: 1.43 mg/dL — ABNORMAL HIGH (ref 0.61–1.24)
GFR calc Af Amer: >60 mL/min (ref >60)
GFR calc non Af Amer: 53 mL/min — ABNORMAL LOW (ref >60)
Glucose, Bld: 111 mg/dL — ABNORMAL HIGH (ref 70–99)
Potassium: 5 mmol/L (ref 3.5–5.1)
Sodium: 139 mmol/L (ref 135–145)

## 2019-09-14 LAB — TROPONIN I (HIGH SENSITIVITY)
Troponin I (High Sensitivity): 61 ng/L — ABNORMAL HIGH (ref <18)
Troponin I (High Sensitivity): 72 ng/L — ABNORMAL HIGH (ref <18)

## 2019-09-14 LAB — BRAIN NATRIURETIC PEPTIDE: B Natriuretic Peptide: 1585.9 pg/mL — ABNORMAL HIGH (ref 0.0–100.0)

## 2019-09-14 LAB — LIPASE, BLOOD: Lipase: 30 U/L (ref 11–51)

## 2019-09-14 NOTE — ED Notes (Signed)
Pt name has been called multiple times no response

## 2019-10-27 ENCOUNTER — Emergency Department (HOSPITAL_COMMUNITY): Payer: Medicaid Other

## 2019-10-27 ENCOUNTER — Other Ambulatory Visit: Payer: Self-pay

## 2019-10-27 ENCOUNTER — Encounter (HOSPITAL_COMMUNITY): Payer: Self-pay | Admitting: Emergency Medicine

## 2019-10-27 ENCOUNTER — Encounter (HOSPITAL_COMMUNITY): Payer: Self-pay | Admitting: *Deleted

## 2019-10-27 ENCOUNTER — Inpatient Hospital Stay (HOSPITAL_COMMUNITY)
Admission: EM | Admit: 2019-10-27 | Discharge: 2019-11-02 | DRG: 291 | Disposition: A | Discharge status: Home / Self Care | Payer: Medicaid Other | Attending: Internal Medicine | Admitting: Internal Medicine

## 2019-10-27 ENCOUNTER — Ambulatory Visit (INDEPENDENT_AMBULATORY_CARE_PROVIDER_SITE_OTHER)
Admission: EM | Admit: 2019-10-27 | Discharge: 2019-10-27 | Disposition: A | Discharge status: Nursing Home (Includes ICF) | Payer: Medicaid Other | Source: Home / Self Care | Attending: Family Medicine | Admitting: Family Medicine

## 2019-10-27 DIAGNOSIS — J9601 Acute respiratory failure with hypoxia: Secondary | ICD-10-CM | POA: Diagnosis not present

## 2019-10-27 DIAGNOSIS — Z7951 Long term (current) use of inhaled steroids: Secondary | ICD-10-CM

## 2019-10-27 DIAGNOSIS — F1010 Alcohol abuse, uncomplicated: Secondary | ICD-10-CM | POA: Diagnosis present

## 2019-10-27 DIAGNOSIS — I10 Essential (primary) hypertension: Secondary | ICD-10-CM | POA: Diagnosis not present

## 2019-10-27 DIAGNOSIS — R0602 Shortness of breath: Secondary | ICD-10-CM | POA: Diagnosis present

## 2019-10-27 DIAGNOSIS — Z720 Tobacco use: Secondary | ICD-10-CM | POA: Diagnosis not present

## 2019-10-27 DIAGNOSIS — E8770 Fluid overload, unspecified: Secondary | ICD-10-CM | POA: Diagnosis not present

## 2019-10-27 DIAGNOSIS — Z20822 Contact with and (suspected) exposure to covid-19: Secondary | ICD-10-CM | POA: Diagnosis present

## 2019-10-27 DIAGNOSIS — J449 Chronic obstructive pulmonary disease, unspecified: Secondary | ICD-10-CM | POA: Diagnosis present

## 2019-10-27 DIAGNOSIS — G894 Chronic pain syndrome: Secondary | ICD-10-CM | POA: Diagnosis present

## 2019-10-27 DIAGNOSIS — I313 Pericardial effusion (noninflammatory): Secondary | ICD-10-CM | POA: Diagnosis present

## 2019-10-27 DIAGNOSIS — I420 Dilated cardiomyopathy: Secondary | ICD-10-CM | POA: Diagnosis present

## 2019-10-27 DIAGNOSIS — I110 Hypertensive heart disease with heart failure: Secondary | ICD-10-CM | POA: Diagnosis present

## 2019-10-27 DIAGNOSIS — I509 Heart failure, unspecified: Secondary | ICD-10-CM

## 2019-10-27 DIAGNOSIS — F259 Schizoaffective disorder, unspecified: Secondary | ICD-10-CM | POA: Diagnosis present

## 2019-10-27 DIAGNOSIS — K219 Gastro-esophageal reflux disease without esophagitis: Secondary | ICD-10-CM | POA: Diagnosis present

## 2019-10-27 DIAGNOSIS — F1410 Cocaine abuse, uncomplicated: Secondary | ICD-10-CM | POA: Diagnosis present

## 2019-10-27 DIAGNOSIS — I429 Cardiomyopathy, unspecified: Secondary | ICD-10-CM | POA: Diagnosis not present

## 2019-10-27 DIAGNOSIS — I513 Intracardiac thrombosis, not elsewhere classified: Secondary | ICD-10-CM

## 2019-10-27 DIAGNOSIS — R778 Other specified abnormalities of plasma proteins: Secondary | ICD-10-CM | POA: Diagnosis not present

## 2019-10-27 DIAGNOSIS — I495 Sick sinus syndrome: Secondary | ICD-10-CM | POA: Diagnosis present

## 2019-10-27 DIAGNOSIS — I5021 Acute systolic (congestive) heart failure: Secondary | ICD-10-CM | POA: Diagnosis not present

## 2019-10-27 DIAGNOSIS — M1990 Unspecified osteoarthritis, unspecified site: Secondary | ICD-10-CM | POA: Diagnosis present

## 2019-10-27 DIAGNOSIS — R1011 Right upper quadrant pain: Secondary | ICD-10-CM

## 2019-10-27 DIAGNOSIS — I081 Rheumatic disorders of both mitral and tricuspid valves: Secondary | ICD-10-CM | POA: Diagnosis present

## 2019-10-27 DIAGNOSIS — I5023 Acute on chronic systolic (congestive) heart failure: Secondary | ICD-10-CM | POA: Diagnosis not present

## 2019-10-27 DIAGNOSIS — I5043 Acute on chronic combined systolic (congestive) and diastolic (congestive) heart failure: Secondary | ICD-10-CM | POA: Diagnosis present

## 2019-10-27 DIAGNOSIS — Z8249 Family history of ischemic heart disease and other diseases of the circulatory system: Secondary | ICD-10-CM

## 2019-10-27 DIAGNOSIS — I472 Ventricular tachycardia: Secondary | ICD-10-CM | POA: Diagnosis not present

## 2019-10-27 DIAGNOSIS — F17210 Nicotine dependence, cigarettes, uncomplicated: Secondary | ICD-10-CM | POA: Diagnosis present

## 2019-10-27 DIAGNOSIS — Z79899 Other long term (current) drug therapy: Secondary | ICD-10-CM | POA: Diagnosis not present

## 2019-10-27 DIAGNOSIS — J431 Panlobular emphysema: Secondary | ICD-10-CM | POA: Diagnosis not present

## 2019-10-27 DIAGNOSIS — I252 Old myocardial infarction: Secondary | ICD-10-CM

## 2019-10-27 DIAGNOSIS — M792 Neuralgia and neuritis, unspecified: Secondary | ICD-10-CM | POA: Diagnosis present

## 2019-10-27 DIAGNOSIS — Z9119 Patient's noncompliance with other medical treatment and regimen: Secondary | ICD-10-CM

## 2019-10-27 DIAGNOSIS — I5042 Chronic combined systolic (congestive) and diastolic (congestive) heart failure: Secondary | ICD-10-CM

## 2019-10-27 HISTORY — DX: Intracardiac thrombosis, not elsewhere classified: I51.3

## 2019-10-27 HISTORY — DX: Right heart failure, unspecified: I50.810

## 2019-10-27 HISTORY — DX: Acute myocardial infarction, unspecified: I21.9

## 2019-10-27 LAB — CBC
HCT: 48.2 % (ref 39.0–52.0)
Hemoglobin: 15 g/dL (ref 13.0–17.0)
MCH: 27.9 pg (ref 26.0–34.0)
MCHC: 31.1 g/dL (ref 30.0–36.0)
MCV: 89.6 fL (ref 80.0–100.0)
Platelets: 201 10*3/uL (ref 150–400)
RBC: 5.38 MIL/uL (ref 4.22–5.81)
RDW: 14.8 % (ref 11.5–15.5)
WBC: 5.2 10*3/uL (ref 4.0–10.5)
nRBC: 0 % (ref 0.0–0.2)

## 2019-10-27 LAB — BASIC METABOLIC PANEL
Anion gap: 10 (ref 5–15)
BUN: 18 mg/dL (ref 6–20)
CO2: 22 mmol/L (ref 22–32)
Calcium: 9.2 mg/dL (ref 8.9–10.3)
Chloride: 108 mmol/L (ref 98–111)
Creatinine, Ser: 1.34 mg/dL — ABNORMAL HIGH (ref 0.61–1.24)
GFR calc Af Amer: >60 mL/min (ref >60)
GFR calc non Af Amer: 57 mL/min — ABNORMAL LOW (ref >60)
Glucose, Bld: 109 mg/dL — ABNORMAL HIGH (ref 70–99)
Potassium: 4.7 mmol/L (ref 3.5–5.1)
Sodium: 140 mmol/L (ref 135–145)

## 2019-10-27 LAB — TROPONIN I (HIGH SENSITIVITY)
Troponin I (High Sensitivity): 60 ng/L — ABNORMAL HIGH (ref <18)
Troponin I (High Sensitivity): 60 ng/L — ABNORMAL HIGH (ref <18)

## 2019-10-27 LAB — BRAIN NATRIURETIC PEPTIDE: B Natriuretic Peptide: 1914.6 pg/mL — ABNORMAL HIGH (ref 0.0–100.0)

## 2019-10-27 MED ORDER — ASPIRIN 81 MG PO CHEW
324.0000 mg | CHEWABLE_TABLET | Freq: Once | ORAL | Status: AC
  Administered 2019-10-27: 324 mg via ORAL
  Filled 2019-10-27: qty 4

## 2019-10-27 MED ORDER — SODIUM CHLORIDE 0.9% FLUSH: 3.0000 mL | Freq: Once | INTRAVENOUS | Status: DC

## 2019-10-27 MED ORDER — FUROSEMIDE 10 MG/ML IJ SOLN
40.0000 mg | Freq: Once | INTRAMUSCULAR | Status: AC
  Administered 2019-10-27: 40 mg via INTRAVENOUS
  Filled 2019-10-27: qty 4

## 2019-10-27 MED ORDER — FUROSEMIDE 10 MG/ML IJ SOLN
40.0000 mg | Freq: Every day | INTRAMUSCULAR | Status: DC
  Filled 2019-10-27: qty 4

## 2019-10-27 MED ORDER — IPRATROPIUM-ALBUTEROL 0.5-2.5 (3) MG/3ML IN SOLN
3.0000 mL | Freq: Once | RESPIRATORY_TRACT | Status: AC
  Administered 2019-10-27: 3 mL via RESPIRATORY_TRACT
  Filled 2019-10-27: qty 3
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
## 2019-10-27 NOTE — ED Provider Notes (Signed)
[facility] EMERGENCY DEPARTMENT Provider Note   CSN: [id number] Arrival date & time: 10/27/19  1411     History Chief Complaint  Patient presents with  . Chest Pain    Allen Mayer is a 59 y.o. male.  HPI Reports he has been having shortness of breath for several days.  He reports it feels like he cannot get a good deep breath in.  He especially notes that he gets short of breath with any kind of exertion.  He also has had swelling in his ankles and feet.  He reports it looks somewhat better now that he has been lying down with his feet up.  He reports he has been getting occasional chest pains that are brief over the past several weeks.  No chest pain at this time.  Patient reports that he had a cardiac catheterization about 2 years ago.  He reports he left the hospital and did not really get any treatment after that.  He does continue to smoke regularly.    Past Medical History:  Diagnosis Date  . Acid reflux   . Arthritis   . Asthma   . Back pain   . Bronchitis   . COPD (chronic obstructive pulmonary disease) (HCC)   . Hypertension   . Irregular heart beat   . Myocardial infarction (HCC)   . Neuropathic pain   . Schizo affective schizophrenia [facility])     Patient Active Problem List   Diagnosis Date Noted  . SOB (shortness of breath)   . Coronary artery calcification seen on CAT scan   . DCM (dilated cardiomyopathy) (HCC)   . Acute CHF (congestive heart failure) (HCC) 12/11/2017  . Elevated troponin 12/11/2017  . COPD (chronic obstructive pulmonary disease) (HCC) 12/11/2017  . 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 Tobacco abuse 11/19/2014  . Essential hypertension, benign 11/19/2014  . Neuropathic pain     Past Surgical History:  Procedure Laterality Date  . None    . RIGHT/LEFT HEART CATH AND CORONARY ANGIOGRAPHY N/A 12/13/2017   Procedure: RIGHT/LEFT HEART CATH AND CORONARY ANGIOGRAPHY;   Surgeon: Runell Gess, MD;  Location: [facility];  Service: Cardiovascular;  Laterality: N/A;       Family History  Problem Relation Age of Onset  . Heart attack Mother        Died age 53  . Heart attack Brother        85    Social History   Tobacco Use  . Smoking status: Current Every Day Smoker    Packs/day: 0.50  . Smokeless tobacco: Never Used  Vaping Use  . Vaping Use: Never used  Substance Use Topics  . Alcohol use: Yes    Comment: occas  . Drug use: Not Currently    Types: Cocaine, Marijuana    Home Medications Prior to Admission medications   Medication Sig Start Date End Date Taking? Authorizing Provider  albuterol (PROVENTIL HFA;VENTOLIN HFA) 108 (90 Base) MCG/ACT inhaler Inhale 2 puffs into the lungs every 4 (four) hours as needed for wheezing or shortness of breath. 06/22/17  Yes Pisciotta, Joni Reining, PA-C  amoxicillin-clavulanate (AUGMENTIN) 875-125 MG tablet Take 1 tablet by mouth every 12 (twelve) hours. 09/02/19   Melene Plan, DO  beclomethasone (QVAR) 80 MCG/ACT inhaler Inhale 1 puff into the lungs 2 (two) times daily as needed. 06/22/17   Pisciotta, Joni Reining, PA-C  benztropine (COGENTIN) 1 MG tablet Take 1 tablet (1 mg total)  by mouth at bedtime. Patient not taking: Reported on 09/02/2019 06/22/17   Pisciotta, Joni Reining, PA-C  citalopram (CELEXA) 20 MG tablet Take 1 tablet (20 mg total) by mouth daily. 06/22/17   Pisciotta, Joni Reining, PA-C  colchicine 0.6 MG tablet Take 0.6 mg by mouth as needed (Gout flareup).     [provider]  fluticasone (FLONASE) 50 MCG/ACT nasal spray Place 1 spray into both nostrils daily as needed for allergies or rhinitis.    [provider]  gabapentin (NEURONTIN) 400 MG capsule Take 1 capsule (400 mg total) by mouth at bedtime. Patient taking differently: Take 400 mg by mouth daily as needed (nerve pain).  01/14/18   Gerhard Munch, MD  lisinopril-hydrochlorothiazide (PRINZIDE,ZESTORETIC) 20-25 MG tablet Take 1  tablet by mouth daily. 01/14/18   Gerhard Munch, MD  nitroGLYCERIN (NITROSTAT) 0.4 MG SL tablet Place 0.4 mg under the tongue every 5 (five) minutes as needed for chest pain.    [provider]  oxyCODONE-acetaminophen (PERCOCET) 10-325 MG tablet Take 1 tablet by mouth every 6 (six) hours as needed for pain.    [provider]  oxyCODONE-acetaminophen (PERCOCET/ROXICET) 5-325 MG tablet Take 1 tablet by mouth every 4 (four) hours as needed for severe pain. Patient not taking: Reported on 09/02/2019 01/14/18   Gerhard Munch, MD  paliperidone (INVEGA) 6 MG 24 hr tablet Take 1 tablet (6 mg total) by mouth daily. 06/22/17   Pisciotta, Joni Reining, PA-C  pantoprazole (PROTONIX) 20 MG tablet Take 1 tablet (20 mg total) by mouth daily. Patient not taking: Reported on 09/02/2019 01/14/18   Gerhard Munch, MD  predniSONE (DELTASONE) 20 MG tablet 2 tabs po daily x 4 days 09/02/19   Melene Plan, DO    Allergies    Patient has no known allergies.  Review of Systems   Review of Systems Systems reviewed and negative except as per HPI Physical Exam Updated Vital Signs BP (!) 122/102 (BP Location: Left Arm)   Pulse (!) 109   Temp 98.1 F (36.7 C) (Oral)   Resp 18   Ht 6' (1.829 m)   Wt 95.3 kg   SpO2 94%   BMI 28.48 kg/m   Physical Exam Constitutional:      Comments: Tachypnea and mild increased work of breathing at rest.  Speaking in full sentences.  Mental status clear.  HENT:     Head: Normocephalic and atraumatic.  Eyes:     Extraocular Movements: Extraocular movements intact.  Cardiovascular:     Comments: Heart regular.  S3 gallop. Pulmonary:     Comments: Tachypnea.  Occasional wheeze crackles at bases. Abdominal:     General: There is no distension.     Palpations: Abdomen is soft.     Tenderness: There is no abdominal tenderness. There is no guarding.  Musculoskeletal:     Comments: Trace pitting edema lower legs and ankles  Skin:    General: Skin is warm and dry.   Neurological:     General: No focal deficit present.     Mental Status: He is oriented to person, place, and time.     Coordination: Coordination normal.  Psychiatric:        Mood and Affect: Mood normal.     ED Results / Procedures / Treatments   Labs (all labs ordered are listed, but only abnormal results are displayed) Labs Reviewed  BASIC METABOLIC PANEL - Abnormal; Notable for the following components:      Result Value   Glucose, Bld 109 (*)  Creatinine, Ser 1.34 (*)    GFR calc non Af Amer 57 (*)    All other components within normal limits  TROPONIN I (HIGH SENSITIVITY) - Abnormal; Notable for the following components:   Troponin I (High Sensitivity) 60 (*)    All other components within normal limits  CBC  BRAIN NATRIURETIC PEPTIDE  TROPONIN I (HIGH SENSITIVITY)    EKG EKG will not import from MUSE. Sinus rhythm.  LVH.  Lateral T wave inversions similar to previous.  No acute MI no significant change from previous tracing  Radiology DG Chest 2 View  Result Date: 10/27/2019 CLINICAL DATA:  Patient complains of SOB with chest pain and sometime he feels lightheaded, patient also states he feels pain in the left side of his neck. Current smoker. EXAM: CHEST - 2 VIEW COMPARISON:  Chest radiograph 09/13/2019 FINDINGS: Stable cardiomediastinal contours with enlarged heart size. Central venous congestion. There are mild diffuse bilateral interstitial opacities similar to prior. No new focal consolidation. No pneumothorax or pleural effusion. No acute finding in the visualized skeleton. IMPRESSION: Cardiomegaly with central vascular congestion and mild bilateral interstitial opacities, likely trace edema, similar to prior. Electronically Signed   By: Emmaline Kluver M.D.   On: 10/27/2019 17:32    Procedures Procedures (including critical care time) CRITICAL CARE Performed by: Arby Barrette   Total critical care time: 30  minutes  Critical care time was exclusive of  separately billable procedures and treating other patients.  Critical care was necessary to treat or prevent imminent or life-threatening deterioration.  Critical care was time spent personally by me on the following activities: development of treatment plan with patient and/or surrogate as well as nursing, discussions with consultants, evaluation of patient's response to treatment, examination of patient, obtaining history from patient or surrogate, ordering and performing treatments and interventions, ordering and review of laboratory studies, ordering and review of radiographic studies, pulse oximetry and re-evaluation of patient's condition. Medications Ordered in ED Medications  sodium chloride flush (NS) 0.9 % injection 3 mL (has no administration in time range)  aspirin chewable tablet 324 mg (has no administration in time range)  furosemide (LASIX) injection 40 mg (has no administration in time range)  ipratropium-albuterol (DUONEB) 0.5-2.5 (3) MG/3ML nebulizer solution 3 mL (3 mLs Nebulization Given 10/27/19 2123)    ED Course  I have reviewed the triage vital signs and the nursing notes.  Pertinent labs & imaging results that were available during my care of the patient were reviewed by me and considered in my medical decision making (see chart for details).    MDM Rules/Calculators/A&P                          Patient presents with increasing shortness of breath and intermittent nonsustained chest pain.  EKG does not show acute ischemic pattern.  Chest x-ray does have vascular congestion.  Prior echo from 12/2017 had EF of 25 to 30%.  At this time, patient presents with increasing shortness of breath and central vascular congestion on chest x-ray.  Mild troponin elevation.  Suspect demand ischemia.  Patient had catheterization done during prior hospitalization 9\2019 without significant coronary artery disease.  Plan for admission for acute CHF exacerbation with known  cardiomyopathy. Final Clinical Impression(s) / ED Diagnoses Final diagnoses:  Acute on chronic congestive heart failure, unspecified heart failure type (HCC)  Cardiomyopathy, unspecified type (HCC)  Troponin I above reference range    Rx / DC Orders ED  Discharge Orders    None       Arby Barrette, MD 10/27/19 2246

## 2019-10-27 NOTE — ED Provider Notes (Signed)
Patient presented to the urgent care center with shortness of breath and some chest pain.  He states that he has been having increased swelling in his ankles, increased abdominal girth, and shortness of breath for several days, up to 2 weeks.  He is out of his medications.  He also has been having fleeting substernal chest pain for the last couple of days.  Chart review indicates that he was hospitalized with the symptoms, and found to have congestive heart failure  I reviewed his EKG which was unchanged.  Does have tachycardia, and a respiratory rate of 30 at rest  Patient does have 2+ pedal edema at the ankle.  He also has rales in both bases.  I explained to the patient that he needs a higher level of care.  He is transferred to the emergency department   Eustace Moore, MD 10/27/19 1724

## 2019-10-27 NOTE — ED Notes (Signed)
Patient is being discharged from the Urgent Care and sent to the Emergency Department via wheelchair with RN . Per Dr. Delton See, patient is in need of higher level of care due to dyspnea, intermittent chest pain, BLE edema. Patient is aware and verbalizes understanding of plan of care.  Vitals:   10/27/19 1347 10/27/19 1357  BP: (!) 146/94   Pulse: (!) 102   Resp: (!) 22 (!) 26  Temp: 97.7 F (36.5 C)   SpO2: 99%
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
## 2019-10-27 NOTE — ED Notes (Signed)
Called pt to recheck vitals. No response.  

## 2019-10-27 NOTE — ED Notes (Signed)
PT siting outside on bench

## 2019-10-27 NOTE — ED Notes (Signed)
Transport called pt in the waiting room to go back for x-ray. No response.

## 2019-10-27 NOTE — ED Notes (Signed)
Pt requesting something to eat and drink for himself and his wife.  Pt's wife sleeping at bedside.

## 2019-10-27 NOTE — ED Notes (Signed)
Pt given turkey sandwich and water per RN  

## 2019-10-27 NOTE — ED Notes (Signed)
Pt placed on 02 via [place] at 3LPM 

## 2019-10-27 NOTE — ED Notes (Signed)
EKG shown to Dr Nelson. 

## 2019-10-28 ENCOUNTER — Encounter (HOSPITAL_COMMUNITY): Payer: Self-pay | Admitting: Family Medicine

## 2019-10-28 ENCOUNTER — Inpatient Hospital Stay (HOSPITAL_COMMUNITY): Payer: Medicaid Other

## 2019-10-28 DIAGNOSIS — I10 Essential (primary) hypertension: Secondary | ICD-10-CM

## 2019-10-28 DIAGNOSIS — J9601 Acute respiratory failure with hypoxia: Secondary | ICD-10-CM

## 2019-10-28 DIAGNOSIS — I5021 Acute systolic (congestive) heart failure: Secondary | ICD-10-CM

## 2019-10-28 DIAGNOSIS — Z720 Tobacco use: Secondary | ICD-10-CM

## 2019-10-28 DIAGNOSIS — I5043 Acute on chronic combined systolic (congestive) and diastolic (congestive) heart failure: Secondary | ICD-10-CM

## 2019-10-28 DIAGNOSIS — I513 Intracardiac thrombosis, not elsewhere classified: Secondary | ICD-10-CM

## 2019-10-28 DIAGNOSIS — R778 Other specified abnormalities of plasma proteins: Secondary | ICD-10-CM

## 2019-10-28 DIAGNOSIS — I5042 Chronic combined systolic (congestive) and diastolic (congestive) heart failure: Secondary | ICD-10-CM

## 2019-10-28 LAB — ECHOCARDIOGRAM COMPLETE
Area-P 1/2: 6.71 cm2
Calc EF: 17.9 %
Height: 72 in
S' Lateral: 6.4 cm
Single Plane A2C EF: 16.5 %
Single Plane A4C EF: 22.4 %
Weight: 3360 oz

## 2019-10-28 LAB — HEMOGLOBIN A1C
Hgb A1c MFr Bld: 6.2 % — ABNORMAL HIGH (ref 4.8–5.6)
Mean Plasma Glucose: 131.24 mg/dL

## 2019-10-28 LAB — RAPID URINE DRUG SCREEN, HOSP PERFORMED
Amphetamines: NOT DETECTED
Barbiturates: NOT DETECTED
Benzodiazepines: NOT DETECTED
Cocaine: POSITIVE — AB
Opiates: NOT DETECTED
Tetrahydrocannabinol: NOT DETECTED

## 2019-10-28 LAB — TROPONIN I (HIGH SENSITIVITY)
Troponin I (High Sensitivity): 55 ng/L — ABNORMAL HIGH (ref <18)
Troponin I (High Sensitivity): 43 ng/L — ABNORMAL HIGH (ref <18)

## 2019-10-28 LAB — LIPID PANEL
Cholesterol: 126 mg/dL (ref 0–200)
HDL: 32 mg/dL — ABNORMAL LOW (ref >40)
LDL Cholesterol: 83 mg/dL (ref 0–99)
Total CHOL/HDL Ratio: 3.9 RATIO
Triglycerides: 57 mg/dL (ref <150)
VLDL: 11 mg/dL (ref 0–40)

## 2019-10-28 LAB — CBC
HCT: 47.9 % (ref 39.0–52.0)
Hemoglobin: 14.8 g/dL (ref 13.0–17.0)
MCH: 28.6 pg (ref 26.0–34.0)
MCHC: 30.9 g/dL (ref 30.0–36.0)
MCV: 92.5 fL (ref 80.0–100.0)
Platelets: 182 10*3/uL (ref 150–400)
RBC: 5.18 MIL/uL (ref 4.22–5.81)
RDW: 14.9 % (ref 11.5–15.5)
WBC: 5.1 10*3/uL (ref 4.0–10.5)
nRBC: 0 % (ref 0.0–0.2)

## 2019-10-28 LAB — MAGNESIUM: Magnesium: 2 mg/dL (ref 1.7–2.4)

## 2019-10-28 LAB — HIV ANTIBODY (ROUTINE TESTING W REFLEX): HIV Screen 4th Generation wRfx: NONREACTIVE

## 2019-10-28 LAB — SARS CORONAVIRUS 2 BY RT PCR (HOSPITAL ORDER, PERFORMED IN ~~LOC~~ HOSPITAL LAB): SARS Coronavirus 2: NEGATIVE

## 2019-10-28 MED ORDER — WARFARIN - PHARMACIST DOSING INPATIENT: Freq: Every day | Status: DC

## 2019-10-28 MED ORDER — LOSARTAN POTASSIUM 50 MG PO TABS
50.0000 mg | ORAL_TABLET | Freq: Every day | ORAL | Status: DC
  Administered 2019-10-28: 50 mg via ORAL
  Filled 2019-10-28 (×2): qty 1

## 2019-10-28 MED ORDER — FUROSEMIDE 10 MG/ML IJ SOLN
40.0000 mg | Freq: Every day | INTRAMUSCULAR | Status: AC
  Administered 2019-10-28 – 2019-10-29 (×2): 40 mg via INTRAVENOUS
  Filled 2019-10-28 (×2): qty 4

## 2019-10-28 MED ORDER — IPRATROPIUM-ALBUTEROL 0.5-2.5 (3) MG/3ML IN SOLN
3.0000 mL | Freq: Four times a day (QID) | RESPIRATORY_TRACT | Status: DC
  Administered 2019-10-28 (×2): 3 mL via RESPIRATORY_TRACT
  Filled 2019-10-28 (×2): qty 3

## 2019-10-28 MED ORDER — LISINOPRIL 20 MG PO TABS
20.0000 mg | ORAL_TABLET | Freq: Every day | ORAL | Status: DC
  Filled 2019-10-28: qty 1

## 2019-10-28 MED ORDER — ALBUTEROL SULFATE HFA 108 (90 BASE) MCG/ACT IN AERS
2.0000 | INHALATION_SPRAY | RESPIRATORY_TRACT | Status: DC | PRN
  Filled 2019-10-28: qty 6.7

## 2019-10-28 MED ORDER — FLUTICASONE PROPIONATE 50 MCG/ACT NA SUSP: 1.0000 | Freq: Every day | NASAL | Status: DC | PRN

## 2019-10-28 MED ORDER — BUDESONIDE 0.25 MG/2ML IN SUSP
0.2500 mg | Freq: Two times a day (BID) | RESPIRATORY_TRACT | Status: DC
  Administered 2019-10-28 – 2019-11-02 (×10): 0.25 mg via RESPIRATORY_TRACT
  Filled 2019-10-28 (×11): qty 2

## 2019-10-28 MED ORDER — NITROGLYCERIN 0.4 MG SL SUBL
0.4000 mg | SUBLINGUAL_TABLET | SUBLINGUAL | Status: DC | PRN
  Administered 2019-10-29 – 2019-10-30 (×2): 0.4 mg via SUBLINGUAL
  Filled 2019-10-28 (×2): qty 1

## 2019-10-28 MED ORDER — LISINOPRIL-HYDROCHLOROTHIAZIDE 20-25 MG PO TABS: 1.0000 | ORAL_TABLET | Freq: Every day | ORAL | Status: DC

## 2019-10-28 MED ORDER — BECLOMETHASONE DIPROPIONATE 80 MCG/ACT IN AERS: 1.0000 | INHALATION_SPRAY | Freq: Two times a day (BID) | RESPIRATORY_TRACT | Status: DC

## 2019-10-28 MED ORDER — PANTOPRAZOLE SODIUM 20 MG PO TBEC
20.0000 mg | DELAYED_RELEASE_TABLET | Freq: Every day | ORAL | Status: DC
  Administered 2019-10-29 – 2019-11-02 (×5): 20 mg via ORAL
  Filled 2019-10-28 (×6): qty 1

## 2019-10-28 MED ORDER — HYDROCHLOROTHIAZIDE 25 MG PO TABS
25.0000 mg | ORAL_TABLET | Freq: Every day | ORAL | Status: DC
  Filled 2019-10-28: qty 1

## 2019-10-28 MED ORDER — ALBUTEROL SULFATE (2.5 MG/3ML) 0.083% IN NEBU: 2.5000 mg | INHALATION_SOLUTION | RESPIRATORY_TRACT | Status: DC | PRN

## 2019-10-28 MED ORDER — HEPARIN (PORCINE) 25000 UT/250ML-% IV SOLN
1500.0000 [IU]/h | INTRAVENOUS | Status: DC
  Administered 2019-10-28 – 2019-11-01 (×8): 1500 [IU]/h via INTRAVENOUS
  Filled 2019-10-28 (×7): qty 250

## 2019-10-28 MED ORDER — GABAPENTIN 400 MG PO CAPS
400.0000 mg | ORAL_CAPSULE | Freq: Every day | ORAL | Status: DC | PRN
  Filled 2019-10-28 (×3): qty 1

## 2019-10-28 MED ORDER — LISINOPRIL 20 MG PO TABS
20.0000 mg | ORAL_TABLET | Freq: Every day | ORAL | Status: DC
  Administered 2019-10-28: 20 mg via ORAL
  Filled 2019-10-28: qty 1

## 2019-10-28 MED ORDER — ENOXAPARIN SODIUM 40 MG/0.4ML ~~LOC~~ SOLN
40.0000 mg | SUBCUTANEOUS | Status: DC
  Filled 2019-10-28: qty 0.4

## 2019-10-28 MED ORDER — IPRATROPIUM-ALBUTEROL 0.5-2.5 (3) MG/3ML IN SOLN
3.0000 mL | Freq: Four times a day (QID) | RESPIRATORY_TRACT | Status: DC | PRN
  Administered 2019-10-29: 3 mL via RESPIRATORY_TRACT
  Filled 2019-10-28: qty 3

## 2019-10-28 MED ORDER — BACLOFEN 10 MG PO TABS
10.0000 mg | ORAL_TABLET | Freq: Three times a day (TID) | ORAL | Status: DC | PRN
  Administered 2019-10-29 – 2019-11-01 (×4): 10 mg via ORAL
  Filled 2019-10-28 (×6): qty 1

## 2019-10-28 MED ORDER — WARFARIN SODIUM 7.5 MG PO TABS
7.5000 mg | ORAL_TABLET | Freq: Once | ORAL | Status: AC
  Administered 2019-10-29: 7.5 mg via ORAL
  Filled 2019-10-28: qty 1

## 2019-10-28 MED ORDER — HEPARIN BOLUS VIA INFUSION
4800.0000 [IU] | Freq: Once | INTRAVENOUS | Status: AC
  Administered 2019-10-28: 4800 [IU] via INTRAVENOUS
  Filled 2019-10-28: qty 4800

## 2019-10-28 MED ORDER — ASPIRIN EC 81 MG PO TBEC
81.0000 mg | DELAYED_RELEASE_TABLET | Freq: Every day | ORAL | Status: DC
  Administered 2019-10-29 – 2019-11-01 (×4): 81 mg via ORAL
  Filled 2019-10-28 (×5): qty 1

## 2019-10-28 NOTE — Consult Note (Signed)
Cardiology Consultation:   Patient ID: Allen Mayer MRN: [id number]; DOB: February 07, 1961  Admit date: 10/27/2019 Date of Consult: 10/28/2019  Primary Care Provider: Medicine, [facility] Cardiologist: Minus Breeding, MD  [facility] Electrophysiologist:  None    Patient Profile:   Allen Mayer is a 59 y.o. male with a hx of Dilated CM non ischemic in 2019 and no follow up since- pt left AMA at that time, COPD, HTN, tobacco use. Cocaine, ETOH  who is being seen today for the evaluation of chest pain with acute systolic HF at the request of Dr. Carles Collet.  History of Present Illness:   Mr. Lankford with above hx and DCM diagnosed in 2019 and cardiac cath at that time with clean coronary arteries, the NICM thought to be due to poorly controlled HTN, and ETOH use.    EF at that time ws 25-30%.  G3DD. And EV function was mildly to moderately reduced.  RA was mildly dilated.   Pt had no follow up.  Now presents 10/27/19 from [facility] with substernal chest pain though he tells me it was only SOB  and swelling and bilateral lower ext edema.  He was out of medications.      Off lisinopril hctz for 2 weeks   EKG:  The EKG was personally reviewed and demonstrates:  SB at 53 with T wave inversion V5-6 but similar to EKGs in 2019, LAFB  Other EKGs with SR at 96  Telemetry:  Telemetry was personally reviewed and demonstrates:  SR  Na 140, K+ 4.7, CL 108, Cr 1.34 Mg+ 2.4,  BNP 1914 Troponin hs 32, 27, 72, 61, 60, 60 55, 43 flat  Tchol 126, HDL 32, LDL 83  Hgb 14.8, HCT 47.9 WBC 5.1 plts 182   hbA1c 6.2  +cocaine   2V CXR FINDINGS: Stable cardiomediastinal contours with enlarged heart size. Central venous congestion. There are mild diffuse bilateral interstitial opacities similar to prior. No new focal consolidation. No pneumothorax or pleural effusion. No acute finding in the visualized skeleton. IMPRESSION: Cardiomegaly with central vascular congestion and mild  bilateral interstitial opacities, likely trace edema, similar to prior.  Echo today with Chronic LV thrombus at the apex.  EF <20%, global hypokinesis, LV severely dilated.  G3 DD, elevated LVEDP.  RV systolic function moderately reduced, and RV size moderately enlarged.  moderately elevated PA systolic pressure.  LA mld to mod dilated and RA moderately dilated.  Small circumferential pericardial effusion.  No tamponade.  Mild to moderate MR  Moderate TR    Past Medical History:  Diagnosis Date  . Acid reflux   . Arthritis   . Asthma   . Back pain   . Bronchitis   . COPD (chronic obstructive pulmonary disease) ([place])   . Hypertension   . Irregular heart beat   . Myocardial infarction ([place])   . Neuropathic pain   . Schizo affective schizophrenia [facility])     Past Surgical History:  Procedure Laterality Date  . None    . RIGHT/LEFT HEART CATH AND CORONARY ANGIOGRAPHY N/A 12/13/2017   Procedure: RIGHT/LEFT HEART CATH AND CORONARY ANGIOGRAPHY;  Surgeon: Lorretta Harp, MD;  Location: [place] [facility];  Service: Cardiovascular;  Laterality: N/A;     Home Medications:  Prior to Admission medications   Medication Sig Start Date End Date Taking? Authorizing Provider  albuterol (PROVENTIL HFA;VENTOLIN HFA) 108 (90 Base) MCG/ACT inhaler Inhale 2 puffs into the lungs every 4 (four) hours as needed for  wheezing or shortness of breath. 06/22/17  Yes Pisciotta, Elmyra Ricks, PA-C  baclofen (LIORESAL) 10 MG tablet Take 10 mg by mouth 3 (three) times daily as needed for muscle spasms. LF 10/09/19 30DS 10/09/19  Yes [provider]  beclomethasone (QVAR) 80 MCG/ACT inhaler Inhale 1 puff into the lungs 2 (two) times daily as needed. 06/22/17  Yes Pisciotta, Elmyra Ricks, PA-C  benztropine (COGENTIN) 1 MG tablet Take 1 tablet (1 mg total) by mouth at bedtime. 06/22/17  Yes Pisciotta, Elmyra Ricks, PA-C  citalopram (CELEXA) 20 MG tablet Take 1 tablet (20 mg total) by mouth daily. 06/22/17  Yes Pisciotta, Elmyra Ricks,  PA-C  colchicine 0.6 MG tablet Take 0.6 mg by mouth as needed (Gout flareup).    Yes [provider]  esomeprazole (NEXIUM) 20 MG packet Take 20 mg by mouth daily before breakfast.   Yes [provider]  FLOVENT HFA 110 MCG/ACT inhaler Inhale 2 puffs into the lungs 2 (two) times daily.  10/09/19  Yes [provider]  fluticasone (FLONASE) 50 MCG/ACT nasal spray Place 1 spray into both nostrils daily as needed for allergies or rhinitis.   Yes [provider]  gabapentin (NEURONTIN) 400 MG capsule Take 1 capsule (400 mg total) by mouth at bedtime. Patient taking differently: Take 400 mg by mouth daily as needed (nerve pain).  01/14/18  Yes Carmin Muskrat, MD  lisinopril-hydrochlorothiazide (PRINZIDE,ZESTORETIC) 20-25 MG tablet Take 1 tablet by mouth daily. 01/14/18  Yes Carmin Muskrat, MD  [facility] 4 MG/0.1ML LIQD nasal spray kit Place 1 spray into the nose as needed for opioid reversal.  09/19/19  Yes [provider]  nitroGLYCERIN (NITROSTAT) 0.4 MG SL tablet Place 0.4 mg under the tongue every 5 (five) minutes as needed for chest pain.   Yes [provider]  oxyCODONE-acetaminophen (PERCOCET) 10-325 MG tablet Take 1 tablet by mouth every 6 (six) hours as needed for pain.   Yes [provider]  paliperidone (INVEGA) 6 MG 24 hr tablet Take 1 tablet (6 mg total) by mouth daily. 06/22/17  Yes Pisciotta, Elmyra Ricks, PA-C  predniSONE (DELTASONE) 20 MG tablet 2 tabs po daily x 4 days Patient taking differently: Take 40 mg by mouth daily. For 4 days 09/02/19  Yes Deno Etienne, DO  Vitamin D, Ergocalciferol, (DRISDOL) 1.25 MG (50000 UNIT) CAPS capsule Take 50,000 Units by mouth once a week.  10/14/19  Yes [provider]  amoxicillin-clavulanate (AUGMENTIN) 875-125 MG tablet Take 1 tablet by mouth every 12 (twelve) hours. 09/02/19   Deno Etienne, DO  oxyCODONE-acetaminophen (PERCOCET/ROXICET) 5-325 MG tablet Take 1 tablet by mouth every 4 (four) hours  as needed for severe pain. Patient not taking: Reported on 10/28/2019 01/14/18   Carmin Muskrat, MD  pantoprazole (PROTONIX) 20 MG tablet Take 1 tablet (20 mg total) by mouth daily. Patient not taking: Reported on 09/02/2019 01/14/18   Carmin Muskrat, MD    Inpatient Medications: Scheduled Meds: . aspirin EC  81 mg Oral Daily  . budesonide  0.25 mg Inhalation BID  . enoxaparin (LOVENOX) injection  40 mg Subcutaneous Q24H  . furosemide  40 mg Intravenous Daily  . ipratropium-albuterol  3 mL Nebulization Q6H  . lisinopril  20 mg Oral Daily  . pantoprazole  20 mg Oral Daily  . sodium chloride flush  3 mL Intravenous Once   Continuous Infusions:  PRN Meds: albuterol, baclofen, fluticasone, gabapentin, nitroGLYCERIN  Allergies:   No Known Allergies  Social History:   Social History   Socioeconomic History  . Marital  status: Married    Spouse name: Not on file  . Number of children: Not on file  . Years of education: Not on file  . Highest education level: Not on file  Occupational History  . Not on file  Tobacco Use  . Smoking status: Current Every Day Smoker    Packs/day: 0.50  . Smokeless tobacco: Never Used  Vaping Use  . Vaping Use: Never used  Substance and Sexual Activity  . Alcohol use: Yes    Comment: occas  . Drug use: Not Currently    Types: Cocaine, Marijuana  . Sexual activity: Not on file  Other Topics Concern  . Not on file  Social History Narrative   Lives with fiance.     Social Determinants of Health   Financial Resource Strain:   . Difficulty of Paying Living Expenses:   Food Insecurity:   . Worried About Charity fundraiser in the Last Year:   . Arboriculturist in the Last Year:   Transportation Needs:   . Film/video editor (Medical):   Marland Kitchen Lack of Transportation (Non-Medical):   Physical Activity:   . Days of Exercise per Week:   . Minutes of Exercise per Session:   Stress:   . Feeling of Stress :   Social Connections:   .  Frequency of Communication with Friends and Family:   . Frequency of Social Gatherings with Friends and Family:   . Attends Religious Services:   . Active Member of Clubs or Organizations:   . Attends Archivist Meetings:   Marland Kitchen Marital Status:   Intimate Partner Violence:   . Fear of Current or Ex-Partner:   . Emotionally Abused:   Marland Kitchen Physically Abused:   . Sexually Abused:     Family History:    Family History  Problem Relation Age of Onset  . Heart attack Mother        Died age 58  . Heart attack Brother        40     ROS:  Please see the history of present illness.  General:no colds or fevers, + weight gain Skin:no rashes or ulcers HEENT:no blurred vision, no congestion CV:see HPI PUL:see HPI GI:no diarrhea constipation some bright blood with stool at times, no indigestion GU:no hematuria, no dysuria MS:no joint pain, + chronic back pain on pain meds.,no claudication Neuro:no syncope, no lightheadedness Endo:no diabetes, no thyroid disease Psych schizo affective schizophrenia   All other ROS reviewed and negative.     Physical Exam/Data:   Vitals:   10/28/19 1000 10/28/19 1030 10/28/19 1230 10/28/19 1410  BP: (!) 141/121 112/84 107/82 122/84  Pulse: 99 95 (!) 108   Resp:   (!) 25   Temp:      TempSrc:      SpO2: 98% 98% 95%   Weight:      Height:        Intake/Output Summary (Last 24 hours) at 10/28/2019 1418 Last data filed at 10/28/2019 0602 Gross per 24 hour  Intake --  Output 1100 ml  Net -1100 ml   Last 3 Weights 10/27/2019 10/27/2019 09/13/2019  Weight (lbs) 210 lb 210 lb 206 lb 9.1 oz  Weight (kg) 95.255 kg 95.255 kg 93.7 kg     Body mass index is 28.48 kg/m.  General:  Well nourished, well developed, in no acute distress HEENT: normal Lymph: no adenopathy Neck: mild JVD Endocrine:  No thryomegaly Vascular: No carotid bruits; pedal pulses  1+ bilaterally   Cardiac:  normal S1, S2; RRR; no murmur gallup rub or click Lungs:  clear to  diminished to auscultation bilaterally, no wheezing, rhonchi or rales  Abd: soft, nontender, no hepatomegaly  Ext: mild edema top of feet.  Musculoskeletal:  No deformities, BUE and BLE strength normal and equal Skin: warm and dry  Neuro:  Alert and oriented X 3 MAE follows commands, no focal abnormalities noted Psych:  Normal affect     Relevant CV Studies: Echo 10/28/19 IMPRESSIONS    1. There appears to be likely chronic LV thrombus at the apex. Patient  without IV access, so contrast not given. . Left ventricular ejection  fraction, by estimation, is <20%. The left ventricle has severely  decreased function. The left ventricle  demonstrates global hypokinesis. The left ventricular internal cavity size  was severely dilated. Left ventricular diastolic parameters are consistent  with Grade III diastolic dysfunction (restrictive). Elevated left  ventricular end-diastolic pressure.  2. Right ventricular systolic function is moderately reduced. The right  ventricular size is moderately enlarged. There is moderately elevated  pulmonary artery systolic pressure.  3. Left atrial size was mild to moderately dilated.  4. Right atrial size was moderately dilated.  5. Small circumferential pericardial effusion, bordering on moderate size  posterior to RV. No echo evidence of tamponade.. The pericardial effusion  is circumferential. There is no evidence of cardiac tamponade.  6. The mitral valve is normal in structure. Mild to moderate mitral valve  regurgitation. No evidence of mitral stenosis.  7. Tricuspid valve regurgitation is moderate.  8. The aortic valve is tricuspid. Aortic valve regurgitation is not  visualized.  9. The inferior vena cava is dilated in size with <50% respiratory  variability, suggesting right atrial pressure of 15 mmHg.   Conclusion(s)/Recommendation(s): Severely reduced LVEF, grade 3 diastolic  dysfunction with nearly nonexistent A wave and elevated  LVEDP. Small to  moderate circumferential pericardial effusion without echo tamponade.  Chronic appearing apical LV thrombus.   FINDINGS  Left Ventricle: There appears to be likely chronic LV thrombus at the  apex. Patient without IV access, so contrast not given. Left ventricular  ejection fraction, by estimation, is <20%. The left ventricle has severely  decreased function. The left  ventricle demonstrates global hypokinesis. The left ventricular internal  cavity size was severely dilated. There is borderline left ventricular  hypertrophy. Left ventricular diastolic parameters are consistent with  Grade III diastolic dysfunction  (restrictive). Elevated left ventricular end-diastolic pressure.   Right Ventricle: The right ventricular size is moderately enlarged. Right  vetricular wall thickness was not assessed. Right ventricular systolic  function is moderately reduced. There is moderately elevated pulmonary  artery systolic pressure. The  tricuspid regurgitant velocity is 3.02 m/s, and with an assumed right  atrial pressure of 15 mmHg, the estimated right ventricular systolic  pressure is 42.3 mmHg.   Left Atrium: Left atrial size was mild to moderately dilated.   Right Atrium: Right atrial size was moderately dilated.   Pericardium: Small circumferential pericardial effusion, bordering on  moderate size posterior to RV. No echo evidence of tamponade. A small  pericardial effusion is present. The pericardial effusion is  circumferential. There is no evidence of cardiac  tamponade.   Mitral Valve: The mitral valve is normal in structure. Mild to moderate  mitral valve regurgitation. No evidence of mitral valve stenosis.   Tricuspid Valve: The tricuspid valve is normal in structure. Tricuspid  valve regurgitation is moderate . No evidence  of tricuspid stenosis.   Aortic Valve: The aortic valve is tricuspid. Aortic valve regurgitation is  not visualized. There is mild  calcification of the aortic valve.   Pulmonic Valve: The pulmonic valve was grossly normal. Pulmonic valve  regurgitation is trivial. No evidence of pulmonic stenosis.   Aorta: The aortic root and ascending aorta are structurally normal, with  no evidence of dilitation.   Venous: The inferior vena cava is dilated in size with less than 50%  respiratory variability, suggesting right atrial pressure of 15 mmHg.   IAS/Shunts: The atrial septum is grossly normal.     LEFT VENTRICLE  PLAX 2D  LVIDd:     6.80 cm   Diastology  LVIDs:     6.40 cm   LV e' lateral:  6.18 cm/s  LV PW:     1.10 cm   LV E/e' lateral: 17.5  LV IVS:    1.10 cm   LV e' medial:  2.75 cm/s  LVOT diam:   2.00 cm   LV E/e' medial: 39.3  LV SV:     33  LV SV Index:  15  LVOT Area:   3.14 cm    LV Volumes (MOD)  LV vol d, MOD A2C: 231.0 ml  LV vol d, MOD A4C: 174.0 ml  LV vol s, MOD A2C: 193.0 ml  LV vol s, MOD A4C: 135.0 ml  LV SV MOD A2C:   38.0 ml  LV SV MOD A4C:   174.0 ml  LV SV MOD BP:   39.6 ml   RIGHT VENTRICLE        Laboratory Data:  High Sensitivity Troponin:   Recent Labs  Lab 10/27/19 1501 10/27/19 2243 10/28/19 0712 10/28/19 1000  TROPONINIHS 60* 60* 55* 43*     Chemistry Recent Labs  Lab 10/27/19 1501  NA 140  K 4.7  CL 108  CO2 22  GLUCOSE 109*  BUN 18  CREATININE 1.34*  CALCIUM 9.2  GFRNONAA 57*  GFRAA >60  ANIONGAP 10    No results for input(s): PROT, ALBUMIN, AST, ALT, ALKPHOS, BILITOT in the last 168 hours. Hematology Recent Labs  Lab 10/27/19 1501 10/28/19 0712  WBC 5.2 5.1  RBC 5.38 5.18  HGB 15.0 14.8  HCT 48.2 47.9  MCV 89.6 92.5  MCH 27.9 28.6  MCHC 31.1 30.9  RDW 14.8 14.9  PLT 201 182   BNP Recent Labs  Lab 10/27/19 2244  BNP 1,914.6*    DDimer No results for input(s): DDIMER in the last 168 hours.   Radiology/Studies:  DG Chest 2 View  Result Date: 10/27/2019 CLINICAL DATA:   Patient complains of SOB with chest pain and sometime he feels lightheaded, patient also states he feels pain in the left side of his neck. Current smoker. EXAM: CHEST - 2 VIEW COMPARISON:  Chest radiograph 09/13/2019 FINDINGS: Stable cardiomediastinal contours with enlarged heart size. Central venous congestion. There are mild diffuse bilateral interstitial opacities similar to prior. No new focal consolidation. No pneumothorax or pleural effusion. No acute finding in the visualized skeleton. IMPRESSION: Cardiomegaly with central vascular congestion and mild bilateral interstitial opacities, likely trace edema, similar to prior. Electronically Signed   By: Audie Pinto M.D.   On: 10/27/2019 17:32   ECHOCARDIOGRAM COMPLETE  Result Date: 10/28/2019    ECHOCARDIOGRAM REPORT   Patient Name:   Allen Mayer Date of Exam: 10/28/2019 Medical Rec #:  [id number]        Height:  72.0 in Accession #:    [id number]       Weight:       210.0 lb Date of Birth:  1961-04-02       BSA:          2.175 m Patient Age:    89 years         BP:           124/75 mmHg Patient Gender: M                HR:           97 bpm. Exam Location:  Inpatient Procedure: 2D Echo, Cardiac Doppler and Color Doppler REPORT CONTAINS CRITICAL RESULT Indications:    T70.17 Acute systolic (congestive) heart failure  History:        Patient has prior history of Echocardiogram examinations, most                 recent 12/11/2017. Cardiomyopathy and CHF, Abnormal ECG, COPD,                 Signs/Symptoms:Dyspnea, Shortness of Breath and Chest Pain; Risk                 Factors:Current Smoker and Hypertension. Elevated troponin.                 Hypoxia. Edema. Dilated cardiomyopathy.  Sonographer:    Roseanna Rainbow RDCS Referring Phys: [id number] [place] [facility]  Sonographer Comments: No IV access, so Definity not done. IMPRESSIONS  1. There appears to be likely chronic LV thrombus at the apex. Patient without IV access, so contrast not given. . Left  ventricular ejection fraction, by estimation, is <20%. The left ventricle has severely decreased function. The left ventricle demonstrates global hypokinesis. The left ventricular internal cavity size was severely dilated. Left ventricular diastolic parameters are consistent with Grade III diastolic dysfunction (restrictive). Elevated left ventricular end-diastolic pressure.  2. Right ventricular systolic function is moderately reduced. The right ventricular size is moderately enlarged. There is moderately elevated pulmonary artery systolic pressure.  3. Left atrial size was mild to moderately dilated.  4. Right atrial size was moderately dilated.  5. Small circumferential pericardial effusion, bordering on moderate size posterior to RV. No echo evidence of tamponade.. The pericardial effusion is circumferential. There is no evidence of cardiac tamponade.  6. The mitral valve is normal in structure. Mild to moderate mitral valve regurgitation. No evidence of mitral stenosis.  7. Tricuspid valve regurgitation is moderate.  8. The aortic valve is tricuspid. Aortic valve regurgitation is not visualized.  9. The inferior vena cava is dilated in size with <50% respiratory variability, suggesting right atrial pressure of 15 mmHg. Conclusion(s)/Recommendation(s): Severely reduced LVEF, grade 3 diastolic dysfunction with nearly nonexistent A wave and elevated LVEDP. Small to moderate circumferential pericardial effusion without echo tamponade. Chronic appearing apical LV thrombus. FINDINGS  Left Ventricle: There appears to be likely chronic LV thrombus at the apex. Patient without IV access, so contrast not given. Left ventricular ejection fraction, by estimation, is <20%. The left ventricle has severely decreased function. The left ventricle demonstrates global hypokinesis. The left ventricular internal cavity size was severely dilated. There is borderline left ventricular hypertrophy. Left ventricular diastolic parameters  are consistent with Grade III diastolic dysfunction (restrictive). Elevated left ventricular end-diastolic pressure. Right Ventricle: The right ventricular size is moderately enlarged. Right vetricular wall thickness was not assessed. Right ventricular systolic function is moderately reduced. There is moderately elevated  pulmonary artery systolic pressure. The tricuspid regurgitant velocity is 3.02 m/s, and with an assumed right atrial pressure of 15 mmHg, the estimated right ventricular systolic pressure is 18.8 mmHg. Left Atrium: Left atrial size was mild to moderately dilated. Right Atrium: Right atrial size was moderately dilated. Pericardium: Small circumferential pericardial effusion, bordering on moderate size posterior to RV. No echo evidence of tamponade. A small pericardial effusion is present. The pericardial effusion is circumferential. There is no evidence of cardiac tamponade. Mitral Valve: The mitral valve is normal in structure. Mild to moderate mitral valve regurgitation. No evidence of mitral valve stenosis. Tricuspid Valve: The tricuspid valve is normal in structure. Tricuspid valve regurgitation is moderate . No evidence of tricuspid stenosis. Aortic Valve: The aortic valve is tricuspid. Aortic valve regurgitation is not visualized. There is mild calcification of the aortic valve. Pulmonic Valve: The pulmonic valve was grossly normal. Pulmonic valve regurgitation is trivial. No evidence of pulmonic stenosis. Aorta: The aortic root and ascending aorta are structurally normal, with no evidence of dilitation. Venous: The inferior vena cava is dilated in size with less than 50% respiratory variability, suggesting right atrial pressure of 15 mmHg. IAS/Shunts: The atrial septum is grossly normal.  LEFT VENTRICLE PLAX 2D LVIDd:         6.80 cm      Diastology LVIDs:         6.40 cm      LV e' lateral:   6.18 cm/s LV PW:         1.10 cm      LV E/e' lateral: 17.5 LV IVS:        1.10 cm      LV e' medial:     2.75 cm/s LVOT diam:     2.00 cm      LV E/e' medial:  39.3 LV SV:         33 LV SV Index:   15 LVOT Area:     3.14 cm  LV Volumes (MOD) LV vol d, MOD A2C: 231.0 ml LV vol d, MOD A4C: 174.0 ml LV vol s, MOD A2C: 193.0 ml LV vol s, MOD A4C: 135.0 ml LV SV MOD A2C:     38.0 ml LV SV MOD A4C:     174.0 ml LV SV MOD BP:      39.6 ml RIGHT VENTRICLE            IVC RV S prime:     6.53 cm/s  IVC diam: 2.10 cm TAPSE (M-mode): 1.3 cm LEFT ATRIUM           Index       RIGHT ATRIUM           Index LA diam:      4.60 cm 2.11 cm/m  RA Area:     26.60 cm LA Vol (A2C): 57.1 ml 26.25 ml/m RA Volume:   106.00 ml 48.73 ml/m LA Vol (A4C): 61.3 ml 28.18 ml/m  AORTIC VALVE LVOT Vmax:   78.30 cm/s LVOT Vmean:  55.400 cm/s LVOT VTI:    0.105 m  AORTA Ao Root diam: 3.10 cm Ao Asc diam:  3.00 cm MITRAL VALVE                TRICUSPID VALVE MV Area (PHT): 6.71 cm     TR Peak grad:   36.5 mmHg MV Decel Time: 113 msec     TR Vmax:        302.00 cm/s MV  E velocity: 108.00 cm/s                             SHUNTS                             Systemic VTI:  0.10 m                             Systemic Diam: 2.00 cm Buford Dresser MD Electronically signed by Buford Dresser MD Signature Date/Time: 10/28/2019/11:01:37 AM    Final         No chest pain  New York Heart Association (NYHA) Functional Class NYHA Class III  Assessment and Plan:   1. Acute on chronic combined systolic and diastolic HF now  Lasix 40 daily and neg 1100 but no intake noted  Pt denies chest pain now, if present most likely due to CHF.  Normal coronary arteries in 2019.  ( home meds lisinopril hctz) no BB and with HR to 40s at times consider OSA - will stop lisinopril and use losartan and plan for entresto with ACE washout.  Pt has medicaid.  discussed salt decrease, and stopping ETOH, Tobacco and any meds not prescribed due to toxicity to hear.  2. dilated NICM with what appears to be chronic LV thrombus and EF <20%.   3. RV failure as well   4. LV thrombus, will need IV heparin and coumadin crossover. May only need anticoagulation for 3 months but will add now.  5. MR and TR due to D-NICM - may improve if Ef improves.  6. ETOH and Cocaine use  7. schizo affective schizophrenia disorder  8. COPD per IM       For questions or updates, please contact [facility] Please consult [email or web address] for contact info under    Signed, Cecilie Kicks, NP  10/28/2019 2:18 PM

## 2019-10-28 NOTE — Progress Notes (Signed)
ANTICOAGULATION CONSULT NOTE - Initial Consult  Pharmacy Consult for Heparin/Warfarin Indication: LV thrombus  No Known Allergies  Patient Measurements: Height: 6' (182.9 cm) Weight: 95.3 kg (210 lb) IBW/kg (Calculated) : 77.6 Heparin Dosing Weight: TBW  Vital Signs: Temp: 99 F (37.2 C) (07/19 0629) Temp Source: Oral (07/19 0629) BP: 106/93 (07/19 1530) Pulse Rate: 99 (07/19 1530)  Labs: Recent Labs    10/27/19 1501 10/27/19 1501 10/27/19 2243 10/28/19 0712 10/28/19 1000  HGB 15.0  --   --  14.8  --   HCT 48.2  --   --  47.9  --   PLT 201  --   --  182  --   CREATININE 1.34*  --   --   --   --   TROPONINIHS 60*   < > 60* 55* 43*   < > = values in this interval not displayed.    Estimated Creatinine Clearance: 70.2 mL/min (A) (by C-G formula based on SCr of 1.34 mg/dL (H)).   Medical History: Past Medical History:  Diagnosis Date   Acid reflux    Arthritis    Asthma    Back pain    Bronchitis    COPD (chronic obstructive pulmonary disease) (HCC)    Hypertension    Irregular heart beat    LV (left ventricular) mural thrombus    Myocardial infarction [facility])    Neuropathic pain    NICM (nonischemic cardiomyopathy) (HCC) 2019   RVF (right ventricular failure) (HCC)    Schizo affective schizophrenia (HCC)    Assessment: 58 YOM presenting with CP, LV thrombus found and now to start warfarin with heparin bridge.  Not on anticoagulation PTA, CBC wnl.  Goal of Therapy:  INR 2-3 Heparin level 0.3-0.7 units/ml Monitor platelets by anticoagulation protocol: Yes   Plan:  Heparin 4800 units IV x 1, and gtt at 1500 units/hr Warfarin 7.5mg  PO x 1 today Daily INR/CBC, 6 hour heparin level, s/s bleeding  Daylene Posey, PharmD Clinical Pharmacist ED Pharmacist Phone # [phone] 10/28/2019 4:08 PM

## 2019-10-28 NOTE — ED Notes (Signed)
Pt expressed that he was now ready for medications since he refused them this morning. Provider notified.
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
## 2019-10-28 NOTE — Progress Notes (Signed)
  Echocardiogram 2D Echocardiogram has been performed.  Janalyn Harder 10/28/2019, 9:04 AM

## 2019-10-28 NOTE — ED Notes (Signed)
Tele  Breakfast Ordered 

## 2019-10-28 NOTE — Evaluation (Signed)
Physical Therapy Evaluation Patient Details Name: Vivian Okelley MRN: [id number] DOB: 11/12/1960 Today's Date: 10/28/2019   History of Present Illness  59 yo male with onset of CHF and SOB was admitted to hosp, noted cardiomegaly, light headed feelings, chest pain, and pulm vascular congestion.  PMHx:  smoker, acid reflux, asthma, OA, COPD, bronchitis, LVMT, MI, neuropathic pain, NICM, RVF, schizoaffective schizophrenia  Clinical Impression  Pt was seen for mobility to see how safely he is walking.  While pt is walking with no LOB he is dropping O2 sats, down to 85% on room air after walking.  He will be recommended to rehab at home vs outpatient to improve endurance, and will work on reducing need to consider supplemental O2.  Acutely work on longer distances and steps if appropriate.    Follow Up Recommendations Home health PT;Outpatient PT;Other (comment)    Equipment Recommendations  None recommended by PT    Recommendations for Other Services       Precautions / Restrictions Precautions Precautions: Fall;Other (comment) Precaution Comments: monitor O2 sats with gait Restrictions Weight Bearing Restrictions: No Other Position/Activity Restrictions: sats drop with room air      Mobility  Bed Mobility Overal bed mobility: Modified Independent                Transfers Overall transfer level: Needs assistance Equipment used: 1 person hand held assist Transfers: Sit to/from Stand;Stand Pivot Transfers Sit to Stand: Min guard            Ambulation/Gait Ambulation/Gait assistance: Min guard Gait Distance (Feet): 200 Feet Assistive device: 1 person hand held assist Gait Pattern/deviations: Wide base of support;Decreased stride length Gait velocity: reduced Gait velocity interpretation: <1.31 ft/sec, indicative of household ambulator    Information systems manager [place] (Stroke Patients Only)       Balance Overall balance  assessment: Needs assistance Sitting-balance support: Feet supported Sitting balance-Leahy Scale: Good     Standing balance support: No upper extremity supported Standing balance-Leahy Scale: Fair                               Pertinent Vitals/Pain Pain Assessment: No/denies pain    Home Living Family/patient expects to be discharged to:: Private residence Living Arrangements: Alone Available Help at Discharge: Family;Available PRN/intermittently Type of Home: House Home Access: Level entry     Home Layout: One level Home Equipment: None      Prior Function Level of Independence: Independent               Hand Dominance   Dominant Hand: Right    Extremity/Trunk Assessment        Lower Extremity Assessment Lower Extremity Assessment: Overall WFL for tasks assessed    Cervical / Trunk Assessment Cervical / Trunk Assessment: Normal  Communication   Communication: No difficulties  Cognition Arousal/Alertness: Awake/alert Behavior During Therapy: WFL for tasks assessed/performed Overall Cognitive Status: Within Functional Limits for tasks assessed                                        General Comments General comments (skin integrity, edema, etc.): has good tolerance for gait with no SOB but with effort was noted to drop O2 sats to 85% on room air  Exercises     Assessment/Plan    PT Assessment Patient needs continued PT services  PT Problem List Decreased mobility;Cardiopulmonary status limiting activity       PT Treatment Interventions DME instruction;Gait training;Stair training;Functional mobility training;Therapeutic activities;Therapeutic exercise;Balance training;Neuromuscular re-education;Patient/family education    PT Goals (Current goals can be found in the Care Plan section)  Acute Rehab PT Goals Patient Stated Goal: to feel stronger PT Goal Formulation: With patient Time For Goal Achievement:  12/16/19 Potential to Achieve Goals: Good    Frequency Min 3X/week   Barriers to discharge Decreased caregiver support home alone    Co-evaluation               AM-PAC PT "6 Clicks" Mobility  Outcome Measure Help needed turning from your back to your side while in a flat bed without using bedrails?: None Help needed moving from lying on your back to sitting on the side of a flat bed without using bedrails?: None Help needed moving to and from a bed to a chair (including a wheelchair)?: A Little Help needed standing up from a chair using your arms (e.g., wheelchair or bedside chair)?: A Little Help needed to walk in hospital room?: A Little Help needed climbing 3-5 steps with a railing? : A Lot 6 Click Score: 19    End of Session Equipment Utilized During Treatment: Gait belt Activity Tolerance: Patient tolerated treatment well;Treatment limited secondary to medical complications (Comment) Patient left: in bed;with call bell/phone within reach Nurse Communication: Mobility status PT Visit Diagnosis: Dizziness and giddiness (R42);Difficulty in walking, not elsewhere classified (R26.2)    Time: 1544-1600 PT Time Calculation (min) (ACUTE ONLY): 16 min   Charges:   PT Evaluation $PT Eval Moderate Complexity: 1 Mod         Ivar Drape 10/28/2019, 8:59 PM  Samul Dada, PT MS Acute Rehab Dept. Number: [facility] [id number] and [facility] [phone]

## 2019-10-28 NOTE — Progress Notes (Signed)
Received call from CCMD regarding bradycardia into 30s. Assessed the patient to find him asymptomatic and denying distress. Per patient, this is due to sleep apnea. No history of sleep apnea is noted, however per patient he has been diagnosed previously. He does not wear CPAP. Verbal order received to being 2L O2 [place] if SpO2 drops into 80s due to apnea. Will continue to monitor.

## 2019-10-28 NOTE — ED Notes (Signed)
Pt refusing all morning medications. States he does not want to take anything until his "old lady" is here.

## 2019-10-28 NOTE — ED Notes (Signed)
Pt had a 9 run of vtac. Cardiology notified. Pt not having any chestpain at this time.

## 2019-10-28 NOTE — ED Notes (Signed)
Pt HR dropping into the 40's sinus brady with some PVC's. MD paged. ekg captured.

## 2019-10-28 NOTE — ED Notes (Signed)
Pt moved into hospital bed 

## 2019-10-28 NOTE — ED Notes (Signed)
Lunch Trays Ordered @ 1101. 

## 2019-10-29 DIAGNOSIS — I429 Cardiomyopathy, unspecified: Secondary | ICD-10-CM

## 2019-10-29 LAB — TROPONIN I (HIGH SENSITIVITY): Troponin I (High Sensitivity): 63 ng/L — ABNORMAL HIGH (ref <18)

## 2019-10-29 LAB — COMPREHENSIVE METABOLIC PANEL
ALT: 54 U/L — ABNORMAL HIGH (ref 0–44)
AST: 38 U/L (ref 15–41)
Albumin: 2.7 g/dL — ABNORMAL LOW (ref 3.5–5.0)
Alkaline Phosphatase: 48 U/L (ref 38–126)
Anion gap: 9 (ref 5–15)
BUN: 19 mg/dL (ref 6–20)
CO2: 28 mmol/L (ref 22–32)
Calcium: 8.7 mg/dL — ABNORMAL LOW (ref 8.9–10.3)
Chloride: 104 mmol/L (ref 98–111)
Creatinine, Ser: 1.33 mg/dL — ABNORMAL HIGH (ref 0.61–1.24)
GFR calc Af Amer: >60 mL/min (ref >60)
GFR calc non Af Amer: 58 mL/min — ABNORMAL LOW (ref >60)
Glucose, Bld: 99 mg/dL (ref 70–99)
Potassium: 4.1 mmol/L (ref 3.5–5.1)
Sodium: 141 mmol/L (ref 135–145)
Total Bilirubin: 1.8 mg/dL — ABNORMAL HIGH (ref 0.3–1.2)
Total Protein: 5.3 g/dL — ABNORMAL LOW (ref 6.5–8.1)

## 2019-10-29 LAB — MAGNESIUM: Magnesium: 2 mg/dL (ref 1.7–2.4)

## 2019-10-29 LAB — CBC
HCT: 45.3 % (ref 39.0–52.0)
Hemoglobin: 14.5 g/dL (ref 13.0–17.0)
MCH: 29.1 pg (ref 26.0–34.0)
MCHC: 32 g/dL (ref 30.0–36.0)
MCV: 91 fL (ref 80.0–100.0)
Platelets: 180 10*3/uL (ref 150–400)
RBC: 4.98 MIL/uL (ref 4.22–5.81)
RDW: 14.6 % (ref 11.5–15.5)
WBC: 5.4 10*3/uL (ref 4.0–10.5)
nRBC: 0 % (ref 0.0–0.2)

## 2019-10-29 LAB — PHOSPHORUS: Phosphorus: 4.2 mg/dL (ref 2.5–4.6)

## 2019-10-29 LAB — PROTIME-INR
INR: 1.1 (ref 0.8–1.2)
Prothrombin Time: 13.9 seconds (ref 11.4–15.2)

## 2019-10-29 LAB — HEPARIN LEVEL (UNFRACTIONATED)
Heparin Unfractionated: 0.5 IU/mL (ref 0.30–0.70)
Heparin Unfractionated: 0.51 IU/mL (ref 0.30–0.70)

## 2019-10-29 MED ORDER — ALUM & MAG HYDROXIDE-SIMETH 200-200-20 MG/5ML PO SUSP: 30.0000 mL | Freq: Once | ORAL | Status: DC

## 2019-10-29 MED ORDER — LOSARTAN POTASSIUM 25 MG PO TABS
25.0000 mg | ORAL_TABLET | Freq: Every day | ORAL | Status: DC
  Administered 2019-10-29 – 2019-11-02 (×5): 25 mg via ORAL
  Filled 2019-10-29 (×5): qty 1

## 2019-10-29 MED ORDER — OXYCODONE HCL 5 MG PO TABS
5.0000 mg | ORAL_TABLET | Freq: Three times a day (TID) | ORAL | Status: DC | PRN
  Administered 2019-10-29 – 2019-11-02 (×9): 5 mg via ORAL
  Filled 2019-10-29 (×11): qty 1

## 2019-10-29 NOTE — Progress Notes (Signed)
Progress Note  Patient Name: Allen Mayer Date of Encounter: 10/29/2019  [facility] Cardiologist: Rollene Rotunda, MD  Subjective   Feeling well. Denies chest pain.  Breathing stable.   Inpatient Medications    Scheduled Meds: . aspirin EC  81 mg Oral Daily  . budesonide  0.25 mg Inhalation BID  . losartan  50 mg Oral Daily  . pantoprazole  20 mg Oral Daily  . sodium chloride flush  3 mL Intravenous Once  . warfarin  7.5 mg Oral ONCE-1600  . Warfarin - Pharmacist Dosing Inpatient   Does not apply q1600   Continuous Infusions: . heparin 1,500 Units/hr (10/29/19 0718)   PRN Meds: albuterol, baclofen, fluticasone, gabapentin, ipratropium-albuterol, nitroGLYCERIN   Vital Signs    Vitals:   10/29/19 0424 10/29/19 0442 10/29/19 0809 10/29/19 0937  BP: 123/86  (!) 123/105   Pulse: (!) 57  78   Resp: 19  20   Temp: 98 F (36.7 C)  97.9 F (36.6 C)   TempSrc: Oral  Oral   SpO2: 99% 97% 90% 96%  Weight:      Height:        Intake/Output Summary (Last 24 hours) at 10/29/2019 1129 Last data filed at 10/29/2019 0930 Gross per 24 hour  Intake 857.39 ml  Output 1831 ml  Net -973.61 ml   Last 3 Weights 10/29/2019 10/28/2019 10/27/2019  Weight (lbs) 201 lb 1.6 oz 206 lb 2.1 oz 210 lb  Weight (kg) 91.218 kg 93.5 kg 95.255 kg      Telemetry    Sinus rhythm.  NSVT 12 and 16 beats - Personally Reviewed  ECG    10/28/19: Sinus rhythm.  Rate 74 bpm.  LVH.  LAD.  Absent R wave progression.   - Personally Reviewed  Physical Exam   VS:  BP (!) 123/105 (BP Location: Left Arm)   Pulse 78   Temp 97.9 F (36.6 C) (Oral)   Resp 20   Ht 6' (1.829 m)   Wt 91.2 kg   SpO2 96%   BMI 27.27 kg/m  , BMI Body mass index is 27.27 kg/m. GENERAL:  Well appearing.  No acute distress HEENT: Pupils equal round and reactive, fundi not visualized, oral mucosa unremarkable NECK:  No jugular venous distention, waveform within normal limits, carotid upstroke brisk and symmetric, no  bruits LUNGS:  Clear to auscultation bilaterally HEART:  RRR.  PMI not displaced or sustained,S1 and S2 within normal limits, no S3, no S4, no clicks, no rubs, no murmurs ABD:  Flat, positive bowel sounds normal in frequency in pitch, no bruits, no rebound, no guarding, no midline pulsatile mass, no hepatomegaly, no splenomegaly EXT:  2 plus pulses throughout, no edema, no cyanosis no clubbing SKIN:  No rashes no nodules NEURO:  Cranial nerves II through XII grossly intact, motor grossly intact throughout PSYCH:  Cognitively intact, oriented to person place and time   Labs    High Sensitivity Troponin:   Recent Labs  Lab 10/27/19 1501 10/27/19 2243 10/28/19 0712 10/28/19 1000 10/29/19 0635  TROPONINIHS 60* 60* 55* 43* 63*      Chemistry Recent Labs  Lab 10/27/19 1501 10/29/19 0635  NA 140 141  K 4.7 4.1  CL 108 104  CO2 22 28  GLUCOSE 109* 99  BUN 18 19  CREATININE 1.34* 1.33*  CALCIUM 9.2 8.7*  PROT  --  5.3*  ALBUMIN  --  2.7*  AST  --  38  ALT  --  54*  ALKPHOS  --  48  BILITOT  --  1.8*  GFRNONAA 57* 58*  GFRAA >60 >60  ANIONGAP 10 9     Hematology Recent Labs  Lab 10/27/19 1501 10/28/19 0712 10/29/19 0635  WBC 5.2 5.1 5.4  RBC 5.38 5.18 4.98  HGB 15.0 14.8 14.5  HCT 48.2 47.9 45.3  MCV 89.6 92.5 91.0  MCH 27.9 28.6 29.1  MCHC 31.1 30.9 32.0  RDW 14.8 14.9 14.6  PLT 201 182 180    BNP Recent Labs  Lab 10/27/19 2244  BNP 1,914.6*     DDimer No results for input(s): DDIMER in the last 168 hours.   Radiology    DG Chest 2 View  Result Date: 10/27/2019 CLINICAL DATA:  Patient complains of SOB with chest pain and sometime he feels lightheaded, patient also states he feels pain in the left side of his neck. Current smoker. EXAM: CHEST - 2 VIEW COMPARISON:  Chest radiograph 09/13/2019 FINDINGS: Stable cardiomediastinal contours with enlarged heart size. Central venous congestion. There are mild diffuse bilateral interstitial opacities similar  to prior. No new focal consolidation. No pneumothorax or pleural effusion. No acute finding in the visualized skeleton. IMPRESSION: Cardiomegaly with central vascular congestion and mild bilateral interstitial opacities, likely trace edema, similar to prior. Electronically Signed   By: Emmaline Kluver M.D.   On: 10/27/2019 17:32   ECHOCARDIOGRAM COMPLETE  Result Date: 10/28/2019    ECHOCARDIOGRAM REPORT   Patient Name:   Allen Mayer Date of Exam: 10/28/2019 Medical Rec #:  [id number]        Height:       72.0 in Accession #:    [id number]       Weight:       210.0 lb Date of Birth:  1960-05-06       BSA:          2.175 m Patient Age:    58 years         BP:           124/75 mmHg Patient Gender: M                HR:           97 bpm. Exam Location:  Inpatient Procedure: 2D Echo, Cardiac Doppler and Color Doppler REPORT CONTAINS CRITICAL RESULT Indications:    I50.21 Acute systolic (congestive) heart failure  History:        Patient has prior history of Echocardiogram examinations, most                 recent 12/11/2017. Cardiomyopathy and CHF, Abnormal ECG, COPD,                 Signs/Symptoms:Dyspnea, Shortness of Breath and Chest Pain; Risk                 Factors:Current Smoker and Hypertension. Elevated troponin.                 Hypoxia. Edema. Dilated cardiomyopathy.  Sonographer:    Sheralyn Boatman RDCS Referring Phys: [id number] CHELSEA N FAIR  Sonographer Comments: No IV access, so Definity not done. IMPRESSIONS  1. There appears to be likely chronic LV thrombus at the apex. Patient without IV access, so contrast not given. . Left ventricular ejection fraction, by estimation, is <20%. The left ventricle has severely decreased function. The left ventricle demonstrates global hypokinesis. The left ventricular internal cavity size was severely dilated. Left ventricular diastolic parameters are  consistent with Grade III diastolic dysfunction (restrictive). Elevated left ventricular end-diastolic pressure.  2.  Right ventricular systolic function is moderately reduced. The right ventricular size is moderately enlarged. There is moderately elevated pulmonary artery systolic pressure.  3. Left atrial size was mild to moderately dilated.  4. Right atrial size was moderately dilated.  5. Small circumferential pericardial effusion, bordering on moderate size posterior to RV. No echo evidence of tamponade.. The pericardial effusion is circumferential. There is no evidence of cardiac tamponade.  6. The mitral valve is normal in structure. Mild to moderate mitral valve regurgitation. No evidence of mitral stenosis.  7. Tricuspid valve regurgitation is moderate.  8. The aortic valve is tricuspid. Aortic valve regurgitation is not visualized.  9. The inferior vena cava is dilated in size with <50% respiratory variability, suggesting right atrial pressure of 15 mmHg. Conclusion(s)/Recommendation(s): Severely reduced LVEF, grade 3 diastolic dysfunction with nearly nonexistent A wave and elevated LVEDP. Small to moderate circumferential pericardial effusion without echo tamponade. Chronic appearing apical LV thrombus. FINDINGS  Left Ventricle: There appears to be likely chronic LV thrombus at the apex. Patient without IV access, so contrast not given. Left ventricular ejection fraction, by estimation, is <20%. The left ventricle has severely decreased function. The left ventricle demonstrates global hypokinesis. The left ventricular internal cavity size was severely dilated. There is borderline left ventricular hypertrophy. Left ventricular diastolic parameters are consistent with Grade III diastolic dysfunction (restrictive). Elevated left ventricular end-diastolic pressure. Right Ventricle: The right ventricular size is moderately enlarged. Right vetricular wall thickness was not assessed. Right ventricular systolic function is moderately reduced. There is moderately elevated pulmonary artery systolic pressure. The tricuspid  regurgitant velocity is 3.02 m/s, and with an assumed right atrial pressure of 15 mmHg, the estimated right ventricular systolic pressure is 51.5 mmHg. Left Atrium: Left atrial size was mild to moderately dilated. Right Atrium: Right atrial size was moderately dilated. Pericardium: Small circumferential pericardial effusion, bordering on moderate size posterior to RV. No echo evidence of tamponade. A small pericardial effusion is present. The pericardial effusion is circumferential. There is no evidence of cardiac tamponade. Mitral Valve: The mitral valve is normal in structure. Mild to moderate mitral valve regurgitation. No evidence of mitral valve stenosis. Tricuspid Valve: The tricuspid valve is normal in structure. Tricuspid valve regurgitation is moderate . No evidence of tricuspid stenosis. Aortic Valve: The aortic valve is tricuspid. Aortic valve regurgitation is not visualized. There is mild calcification of the aortic valve. Pulmonic Valve: The pulmonic valve was grossly normal. Pulmonic valve regurgitation is trivial. No evidence of pulmonic stenosis. Aorta: The aortic root and ascending aorta are structurally normal, with no evidence of dilitation. Venous: The inferior vena cava is dilated in size with less than 50% respiratory variability, suggesting right atrial pressure of 15 mmHg. IAS/Shunts: The atrial septum is grossly normal.  LEFT VENTRICLE PLAX 2D LVIDd:         6.80 cm      Diastology LVIDs:         6.40 cm      LV e' lateral:   6.18 cm/s LV PW:         1.10 cm      LV E/e' lateral: 17.5 LV IVS:        1.10 cm      LV e' medial:    2.75 cm/s LVOT diam:     2.00 cm      LV E/e' medial:  39.3 LV SV:  33 LV SV Index:   15 LVOT Area:     3.14 cm  LV Volumes (MOD) LV vol d, MOD A2C: 231.0 ml LV vol d, MOD A4C: 174.0 ml LV vol s, MOD A2C: 193.0 ml LV vol s, MOD A4C: 135.0 ml LV SV MOD A2C:     38.0 ml LV SV MOD A4C:     174.0 ml LV SV MOD BP:      39.6 ml RIGHT VENTRICLE            IVC RV S  prime:     6.53 cm/s  IVC diam: 2.10 cm TAPSE (M-mode): 1.3 cm LEFT ATRIUM           Index       RIGHT ATRIUM           Index LA diam:      4.60 cm 2.11 cm/m  RA Area:     26.60 cm LA Vol (A2C): 57.1 ml 26.25 ml/m RA Volume:   106.00 ml 48.73 ml/m LA Vol (A4C): 61.3 ml 28.18 ml/m  AORTIC VALVE LVOT Vmax:   78.30 cm/s LVOT Vmean:  55.400 cm/s LVOT VTI:    0.105 m  AORTA Ao Root diam: 3.10 cm Ao Asc diam:  3.00 cm MITRAL VALVE                TRICUSPID VALVE MV Area (PHT): 6.71 cm     TR Peak grad:   36.5 mmHg MV Decel Time: 113 msec     TR Vmax:        302.00 cm/s MV E velocity: 108.00 cm/s                             SHUNTS                             Systemic VTI:  0.10 m                             Systemic Diam: 2.00 cm Jodelle Red MD Electronically signed by Jodelle Red MD Signature Date/Time: 10/28/2019/11:01:37 AM    Final     Cardiac Studies   Echo 10/28/19:   1. There appears to be likely chronic LV thrombus at the apex. Patient  without IV access, so contrast not given. . Left ventricular ejection  fraction, by estimation, is <20%. The left ventricle has severely  decreased function. The left ventricle  demonstrates global hypokinesis. The left ventricular internal cavity size  was severely dilated. Left ventricular diastolic parameters are consistent  with Grade III diastolic dysfunction (restrictive). Elevated left  ventricular end-diastolic pressure.  2. Right ventricular systolic function is moderately reduced. The right  ventricular size is moderately enlarged. There is moderately elevated  pulmonary artery systolic pressure.  3. Left atrial size was mild to moderately dilated.  4. Right atrial size was moderately dilated.  5. Small circumferential pericardial effusion, bordering on moderate size  posterior to RV. No echo evidence of tamponade.. The pericardial effusion  is circumferential. There is no evidence of cardiac tamponade.  6. The mitral  valve is normal in structure. Mild to moderate mitral valve  regurgitation. No evidence of mitral stenosis.  7. Tricuspid valve regurgitation is moderate.  8. The aortic valve is tricuspid. Aortic valve regurgitation is not  visualized.  9. The inferior  vena cava is dilated in size with <50% respiratory  variability, suggesting right atrial pressure of 15 mmHg.   Patient Profile     Mr. Gouker is a 69M with chronic systolic and diastolic heart failure, hypertension, COPD and polysubstance abuse (cocaine, EtOH, tobacco) admitted with acute on chronic systolic and diastolic heart failure and LV thrombus.  Assessment & Plan    # Acute on chronic systolic and diastolic heart failure:  # Hypertension:  Prior cath in 2019 showed no CAD.  Likely 2/2 polysubstance abuse.  Volume status improved after receiving IV lasix.  He received 1 dose of losartan and his blood pressure is a little low today.  We will reduce it to 25 mg.  No beta-blocker due to his ongoing cocaine abuse and positive U tox on admission.  Warfarin starts today for LV thrombus.  Continue heparin with an INR goal of 2-3.  He understands that this will require close monitoring as an outpatient.  Given his nonadherence in the past and polysubstance abuse history, would only give a 1 month supply of warfarin at discharge to be refilled if he follows up regularly in the Coumadin clinic.  # Polysubstance abuse: Cessation advised.  Patient needs counseling services.      For questions or updates, please contact [facility] Please consult [email or web address] for contact info under        Signed, Chilton Si, MD  10/29/2019, 11:29 AM

## 2019-10-29 NOTE — Progress Notes (Signed)
CRITICAL VALUE ALERT  Critical Value: troponin=63  Date & Time Notied:  10/29/19 at 827  Provider Notified: dr. tat  Orders Received/Actions taken: pending

## 2019-10-29 NOTE — Evaluation (Addendum)
Occupational Therapy Evaluation Patient Details Name: Allen Mayer MRN: [id number] DOB: Jun 01, 1960 Today's Date: 10/29/2019    History of Present Illness 59 yo male with onset of CHF and SOB was admitted to hosp, noted cardiomegaly, light headed feelings, chest pain, and pulm vascular congestion.  PMHx:  smoker, acid reflux, asthma, OA, COPD, bronchitis, LVMT, MI, neuropathic pain, NICM, RVF, schizoaffective schizophrenia   Clinical Impression   PTA, patient was living alone and was independent with BADLs/IADLs including bathing/dressing/toieting and cooking/cleaning. Patient currently presents near baseline level of function requiring grossly Mod I to supervision A (for line management only) for functional mobility, functional tranfers, and self-care tasks including grooming at sink level, toileting/hygiene/clothing management, and LB dressing to don footwear seated EOB. Patient also limited by decreased safety awareness and impulsivity (likely baseline). Patient would benefit from continued acute OT services to increase safety and independence with self-care tasks in prep for return to prior level of living.     Follow Up Recommendations  No OT follow up;Supervision - Intermittent    Equipment Recommendations       Recommendations for Other Services       Precautions / Restrictions Precautions Precautions: Fall;Other (comment) Precaution Comments: monitor O2 sats with gait Restrictions Weight Bearing Restrictions: No Other Position/Activity Restrictions: SpO2 >95% at rest and with activity.       Mobility Bed Mobility Overal bed mobility: Modified Independent                Transfers Overall transfer level: Needs assistance   Transfers: Sit to/from Stand;Stand Pivot Transfers Sit to Stand: Supervision Stand pivot transfers: Supervision       General transfer comment: Sup A for line management only    Balance Overall balance assessment: Needs  assistance Sitting-balance support: Feet supported Sitting balance-Leahy Scale: Good     Standing balance support: No upper extremity supported Standing balance-Leahy Scale: Fair Standing balance comment: Single UE support on sink suface during grooming in standing.                            ADL either performed or assessed with clinical judgement   ADL Overall ADL's : Needs assistance/impaired     Grooming: Independent;Wash/dry hands;Wash/dry face;Oral care;Standing Grooming Details (indicate cue type and reason): Completed 3/3 grooming tasks in standing without rest break or use of DME Upper Body Bathing: Supervision/ safety   Lower Body Bathing: Supervison/ safety       Lower Body Dressing: Modified independent;Sitting/lateral leans;Sit to/from stand Lower Body Dressing Details (indicate cue type and reason): Able to don footwear seated EOB.  Toilet Transfer: Radiographer, therapeutic Details (indicate cue type and reason): Assist for line management only  Toileting- Clothing Manipulation and Hygiene: Supervision/safety Toileting - Clothing Manipulation Details (indicate cue type and reason): Standing at sink level with assist for line management only.      Functional mobility during ADLs: Supervision/safety General ADL Comments: Short distance to commode in bathroom without external assist or use of DME.  Sup A for line management.      Vision Baseline Vision/History: No visual deficits Patient Visual Report: No change from baseline Vision Assessment?: No apparent visual deficits     Perception     Praxis      Pertinent Vitals/Pain Pain Assessment: No/denies pain     Hand Dominance Right   Extremity/Trunk Assessment Upper Extremity Assessment Upper Extremity Assessment: Overall WFL for tasks assessed   Lower Extremity Assessment Lower  Extremity Assessment: Overall WFL for tasks assessed   Cervical / Trunk Assessment Cervical / Trunk  Assessment: Normal   Communication Communication Communication: No difficulties   Cognition Arousal/Alertness: Awake/alert Behavior During Therapy: WFL for tasks assessed/performed Overall Cognitive Status: Impaired/Different from baseline Area of Impairment: Safety/judgement                         Safety/Judgement: Decreased awareness of safety     General Comments: Impulsive    General Comments       Exercises Exercises: Other exercises (LE strength was grossly [facility])   Shoulder Instructions      Home Living Family/patient expects to be discharged to:: Private residence Living Arrangements: Alone Available Help at Discharge: Family;Available PRN/intermittently Type of Home: House Home Access: Level entry     Home Layout: One level     Bathroom Shower/Tub: Producer, television/film/video: Standard     Home Equipment: Environmental consultant - 4 wheels;Walker - standard (Intermittent use of 4WW vs. SPC)          Prior Functioning/Environment Level of Independence: Independent        Comments: With BADLs/IADLs. Patient currently on disability.         OT Problem List: Decreased safety awareness      OT Treatment/Interventions:      OT Goals(Current goals can be found in the care plan section) ADL Goals Additional ADL Goal #1: Patient will recall and incorporate 3 energy conservation strategies during morning BADLs. Additional ADL Goal #2: Patient will demonstrate increased safety awareness in prep for completion of BADLs to decrease risk of falls.  OT Frequency:     Barriers to D/C:            Co-evaluation              AM-PAC OT "6 Clicks" Daily Activity     Outcome Measure Help from another person eating meals?: None Help from another person taking care of personal grooming?: A Little Help from another person toileting, which includes using toliet, bedpan, or urinal?: A Little Help from another person bathing (including washing, rinsing,  drying)?: A Little Help from another person to put on and taking off regular upper body clothing?: None Help from another person to put on and taking off regular lower body clothing?: A Little 6 Click Score: 20   End of Session Equipment Utilized During Treatment: Gait belt  Activity Tolerance: Patient tolerated treatment well Patient left: in chair;with call bell/phone within reach  OT Visit Diagnosis: Unsteadiness on feet (R26.81)                Time: 3419-3790 OT Time Calculation (min): 23 min Charges:  OT General Charges $OT Visit: 1 Visit OT Evaluation $OT Eval Low Complexity: 1 Low OT Treatments $Self Care/Home Management : 8-22 mins  Mazal Ebey H. OTR/L Supplemental OT, Department of rehab services [phone]  Carrel Leather R H. 10/29/2019, 10:26 AM

## 2019-10-29 NOTE — Progress Notes (Signed)
Patient currently refusing supplemental O2. Educated patient on need for oxygenation due to ongoing sleep apnea without access to cpap. Patient stated he will replace the nasal cannula when he feels he needs it.

## 2019-10-29 NOTE — Significant Event (Signed)
HOSPITAL MEDICINE OVERNIGHT EVENT NOTE  OVERNIGHT EVENT (7/19 9PM)  Notified by nursing patient had a short course of hypoxia in the 80's associated with short bout of bradycardia in the 30's.  Patient was sleeping and findings immediately resolved when patient was aroused.  Patient reports having been diagnosed with OSA in the past but he has never used CPAP.  Will place on slow rate of supplemental O2 with sleep.  OVERNIGHT EVENT (7/20 12:05AM)  Notified by nursing patient is complaining of epigastric abdominal discomfort, had a 14 beat run of Vtach.  ECG obtained revealing sinus rhythm without ST segment change.  Will try trial of Nitroglycerin SL, followed by Providing patient with Maalox, obtaining troponin, BMP, magnesium.    Allen Mayer

## 2019-10-29 NOTE — Progress Notes (Signed)
ANTICOAGULATION CONSULT NOTE   Pharmacy Consult for Heparin/Warfarin Indication: LV thrombus  No Known Allergies  Patient Measurements: Height: 6' (182.9 cm) Weight: 93.5 kg (206 lb 2.1 oz) IBW/kg (Calculated) : 77.6 Heparin Dosing Weight: TBW  Vital Signs: Temp: 97.6 F (36.4 C) (07/20 0019) Temp Source: Oral (07/20 0019) BP: 126/92 (07/20 0019) Pulse Rate: 90 (07/20 0019)  Labs: Recent Labs    10/27/19 1501 10/27/19 1501 10/27/19 2243 10/28/19 0712 10/28/19 1000 10/28/19 2334  HGB 15.0  --   --  14.8  --   --   HCT 48.2  --   --  47.9  --   --   PLT 201  --   --  182  --   --   HEPARINUNFRC  --   --   --   --   --  0.51  CREATININE 1.34*  --   --   --   --   --   TROPONINIHS 60*   < > 60* 55* 43*  --    < > = values in this interval not displayed.    Estimated Creatinine Clearance: 69.7 mL/min (A) (by C-G formula based on SCr of 1.34 mg/dL (H)).   Medical History: Past Medical History:  Diagnosis Date  . Acid reflux   . Arthritis   . Asthma   . Back pain   . Bronchitis   . COPD (chronic obstructive pulmonary disease) (HCC)   . Hypertension   . Irregular heart beat   . LV (left ventricular) mural thrombus   . Myocardial infarction (HCC)   . Neuropathic pain   . NICM (nonischemic cardiomyopathy) (HCC) 2019  . RVF (right ventricular failure) (HCC)   . Schizo affective schizophrenia (HCC)    Assessment: 58 YOM presenting with CP, LV thrombus found and now to start warfarin with heparin bridge.  Not on anticoagulation PTA, CBC wnl.  7/20 AM update:  Initial heparin level therapeutic  Goal of Therapy:  INR 2-3 Heparin level 0.3-0.7 units/ml Monitor platelets by anticoagulation protocol: Yes   Plan:  Cont heparin at 1500 units/hr Confirmatory heparin level with AM labs  Abran Duke, PharmD, BCPS Clinical Pharmacist Phone: [phone]

## 2019-10-29 NOTE — Progress Notes (Signed)
Pt refused taking bp med-cozzar, stated that MD told him not to take any bp yesterday. Education given to pt about the med. MD notified.   Lawson Radar, RN

## 2019-10-29 NOTE — Plan of Care (Signed)
Plan of care initiated.

## 2019-10-29 NOTE — Progress Notes (Signed)
PT Cancellation Note  Patient Details Name: Darrelle Wiberg MRN: [id number] DOB: 06-13-1960   Cancelled Treatment:    Reason Eval/Treat Not Completed: Other (comment).  Refused PT twice today, stating he already worked with therapy, and then afterward declined for same reason.  Pt is walking on the hall with his sig. other with no LOB, and will continue to encourage him to work with PT on strengthening for LE's.     Ivar Drape 10/29/2019, 1:39 PM   Samul Dada, PT MS Acute Rehab Dept. Number: [facility] [id number] and [facility] [phone]

## 2019-10-29 NOTE — Progress Notes (Signed)
ANTICOAGULATION CONSULT NOTE   Pharmacy Consult for Heparin/Warfarin Indication: LV thrombus  No Known Allergies  Patient Measurements: Height: 6' (182.9 cm) Weight: 91.2 kg (201 lb 1.6 oz) IBW/kg (Calculated) : 77.6 Heparin Dosing Weight: TBW  Vital Signs: Temp: 97.9 F (36.6 C) (07/20 0809) Temp Source: Oral (07/20 0809) BP: 123/105 (07/20 0809) Pulse Rate: 78 (07/20 0809)  Labs: Recent Labs    10/27/19 1501 10/27/19 2243 10/28/19 0712 10/28/19 1000 10/28/19 2334 10/29/19 0635  HGB 15.0  --  14.8  --   --  14.5  HCT 48.2  --  47.9  --   --  45.3  PLT 201  --  182  --   --  180  LABPROT  --   --   --   --   --  13.9  INR  --   --   --   --   --  1.1  HEPARINUNFRC  --   --   --   --  0.51 0.50  CREATININE 1.34*  --   --   --   --  1.33*  TROPONINIHS 60*   < > 55* 43*  --  63*   < > = values in this interval not displayed.    Estimated Creatinine Clearance: 64.8 mL/min (A) (by C-G formula based on SCr of 1.33 mg/dL (H)).   Medical History: Past Medical History:  Diagnosis Date  . Acid reflux   . Arthritis   . Asthma   . Back pain   . Bronchitis   . COPD (chronic obstructive pulmonary disease) (HCC)   . Hypertension   . Irregular heart beat   . LV (left ventricular) mural thrombus   . Myocardial infarction (HCC)   . Neuropathic pain   . NICM (nonischemic cardiomyopathy) (HCC) 2019  . RVF (right ventricular failure) (HCC)   . Schizo affective schizophrenia [facility])    Assessment: 59 yo male presented with chest pain and an LV thrombus found. Patient is to start warfarin with a heparin drip bridge. Patient was not on anticoagulation PTA   Heparin IV started on 7/19 at 1500 units/hr with a 6-hr heparin level therapeutic at 0.51. Morning heparin level on 7/20 resulted therapeutic at 0.50. Will continue heparin drip at the current rate and give the first dose of warfarin at 7.5 mg PO tonight. CBC WNL and no bleeding is noted or documented.   Goal of Therapy:   INR 2-3 Heparin level 0.3-0.7 units/ml Monitor platelets by anticoagulation protocol: Yes   Plan:  Continue heparin IV at 1500 units/hr Give 7.5 mg warfarin PO tonight Daily heparin level and CBC Monitor for signs and symptoms of bleeding  Sanda Klein, PharmD, RPh  PGY-1 Pharmacy Resident 10/29/2019 10:32 AM  Please check [email or web address] for unit-specific pharmacy phone numbers.
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
## 2019-10-30 DIAGNOSIS — I513 Intracardiac thrombosis, not elsewhere classified: Secondary | ICD-10-CM

## 2019-10-30 DIAGNOSIS — M792 Neuralgia and neuritis, unspecified: Secondary | ICD-10-CM

## 2019-10-30 DIAGNOSIS — I5023 Acute on chronic systolic (congestive) heart failure: Secondary | ICD-10-CM

## 2019-10-30 DIAGNOSIS — J431 Panlobular emphysema: Secondary | ICD-10-CM

## 2019-10-30 LAB — PROTIME-INR
INR: 1.1 (ref 0.8–1.2)
Prothrombin Time: 13.8 seconds (ref 11.4–15.2)

## 2019-10-30 LAB — COMPREHENSIVE METABOLIC PANEL
ALT: 69 U/L — ABNORMAL HIGH (ref 0–44)
AST: 47 U/L — ABNORMAL HIGH (ref 15–41)
Albumin: 2.8 g/dL — ABNORMAL LOW (ref 3.5–5.0)
Alkaline Phosphatase: 55 U/L (ref 38–126)
Anion gap: 9 (ref 5–15)
BUN: 13 mg/dL (ref 6–20)
CO2: 28 mmol/L (ref 22–32)
Calcium: 8.6 mg/dL — ABNORMAL LOW (ref 8.9–10.3)
Chloride: 103 mmol/L (ref 98–111)
Creatinine, Ser: 1.22 mg/dL (ref 0.61–1.24)
GFR calc Af Amer: >60 mL/min (ref >60)
GFR calc non Af Amer: >60 mL/min (ref >60)
Glucose, Bld: 116 mg/dL — ABNORMAL HIGH (ref 70–99)
Potassium: 3.9 mmol/L (ref 3.5–5.1)
Sodium: 140 mmol/L (ref 135–145)
Total Bilirubin: 1.1 mg/dL (ref 0.3–1.2)
Total Protein: 5 g/dL — ABNORMAL LOW (ref 6.5–8.1)

## 2019-10-30 LAB — RAPID URINE DRUG SCREEN, HOSP PERFORMED
Amphetamines: NOT DETECTED
Barbiturates: NOT DETECTED
Benzodiazepines: NOT DETECTED
Cocaine: POSITIVE — AB
Opiates: NOT DETECTED
Tetrahydrocannabinol: NOT DETECTED

## 2019-10-30 LAB — CBC
HCT: 42.6 % (ref 39.0–52.0)
Hemoglobin: 13.5 g/dL (ref 13.0–17.0)
MCH: 28.5 pg (ref 26.0–34.0)
MCHC: 31.7 g/dL (ref 30.0–36.0)
MCV: 90.1 fL (ref 80.0–100.0)
Platelets: 153 10*3/uL (ref 150–400)
RBC: 4.73 MIL/uL (ref 4.22–5.81)
RDW: 14.6 % (ref 11.5–15.5)
WBC: 4.2 10*3/uL (ref 4.0–10.5)
nRBC: 0 % (ref 0.0–0.2)

## 2019-10-30 LAB — MAGNESIUM: Magnesium: 1.9 mg/dL (ref 1.7–2.4)

## 2019-10-30 LAB — HEPARIN LEVEL (UNFRACTIONATED): Heparin Unfractionated: 0.65 IU/mL (ref 0.30–0.70)

## 2019-10-30 MED ORDER — POLYETHYLENE GLYCOL 3350 17 G PO PACK: 17.0000 g | PACK | Freq: Every day | ORAL | Status: DC | PRN

## 2019-10-30 MED ORDER — NICOTINE 14 MG/24HR TD PT24
14.0000 mg | MEDICATED_PATCH | Freq: Every day | TRANSDERMAL | Status: DC
  Administered 2019-10-30 – 2019-11-02 (×4): 14 mg via TRANSDERMAL
  Filled 2019-10-30 (×4): qty 1

## 2019-10-30 MED ORDER — WARFARIN SODIUM 7.5 MG PO TABS
7.5000 mg | ORAL_TABLET | Freq: Once | ORAL | Status: AC
  Administered 2019-10-30: 7.5 mg via ORAL
  Filled 2019-10-30: qty 1

## 2019-10-30 MED ORDER — SENNOSIDES-DOCUSATE SODIUM 8.6-50 MG PO TABS: 2.0000 | ORAL_TABLET | Freq: Every evening | ORAL | Status: DC | PRN

## 2019-10-30 NOTE — Progress Notes (Signed)
ANTICOAGULATION CONSULT NOTE   Pharmacy Consult for Heparin/Warfarin Indication: LV thrombus  No Known Allergies  Patient Measurements: Height: 6' (182.9 cm) Weight: 89.2 kg (196 lb 11.2 oz) IBW/kg (Calculated) : 77.6 Heparin Dosing Weight: TBW  Vital Signs: Temp: 98.3 F (36.8 C) (07/21 0306) Temp Source: Oral (07/21 0018) BP: 100/82 (07/21 0306) Pulse Rate: 90 (07/21 0309)  Labs: Recent Labs    10/27/19 1501 10/27/19 2243 10/28/19 0712 10/28/19 0712 10/28/19 1000 10/28/19 2334 10/29/19 0635 10/30/19 0730  HGB 15.0  --  14.8   < >  --   --  14.5 13.5  HCT 48.2  --  47.9  --   --   --  45.3 42.6  PLT 201  --  182  --   --   --  180 153  LABPROT  --   --   --   --   --   --  13.9 13.8  INR  --   --   --   --   --   --  1.1 1.1  HEPARINUNFRC  --   --   --   --   --  0.51 0.50 0.65  CREATININE 1.34*  --   --   --   --   --  1.33*  --   TROPONINIHS 60*   < > 55*  --  43*  --  63*  --    < > = values in this interval not displayed.    Estimated Creatinine Clearance: 64.8 mL/min (A) (by C-G formula based on SCr of 1.33 mg/dL (H)).   Medical History: Past Medical History:  Diagnosis Date  . Acid reflux   . Arthritis   . Asthma   . Back pain   . Bronchitis   . COPD (chronic obstructive pulmonary disease) (HCC)   . Hypertension   . Irregular heart beat   . LV (left ventricular) mural thrombus   . Myocardial infarction (HCC)   . Neuropathic pain   . NICM (nonischemic cardiomyopathy) (HCC) 2019  . RVF (right ventricular failure) (HCC)   . Schizo affective schizophrenia [facility])    Assessment: 59 yo male presented with chest pain and an LV thrombus found. Patient is to start warfarin with a heparin drip bridge. Patient was not on anticoagulation PTA  Heparin IV running at 1500 units/hr with the heparin level therapeutic at 0.65 this morning. Will continue heparin drip at the current rate and give another dose of warfarin 7.5 mg PO tonight. CBC WNL and no bleeding is  noted or documented.   Goal of Therapy:  INR 2-3 Heparin level 0.3-0.7 units/ml Monitor platelets by anticoagulation protocol: Yes   Plan:  Continue heparin IV at 1500 units/hr Give 7.5 mg warfarin PO tonight Daily heparin level and CBC Monitor for signs and symptoms of bleeding  Sanda Klein, PharmD, RPh  PGY-1 Pharmacy Resident 10/30/2019 8:13 AM  Please check [email or web address] for unit-specific pharmacy phone numbers.

## 2019-10-30 NOTE — Progress Notes (Signed)
Physical Therapy Treatment Patient Details Name: Allen Mayer MRN: [id number] DOB: Dec 15, 1960 Today's Date: 10/30/2019    History of Present Illness 59 yo male with onset of CHF and SOB was admitted to hosp, noted cardiomegaly, light headed feelings, chest pain, and pulm vascular congestion.  PMHx:  smoker, acid reflux, asthma, OA, COPD, bronchitis, LVMT, MI, neuropathic pain, NICM, RVF, schizoaffective schizophrenia    PT Comments    Pt initially requests PT to follow back as he was resting. Later pt met PT in hallway for ambulation. Pt is now able to ambulate in hallway with min guard for safety with mild gait instability. Once back in room, pt agreeable to performing sit<>stand for LE strengthening. With 5 times sit<>stand SaO2 on RA dropped to 85%O2. Rebounded quickly and pt able to complete 1 more bout of 5 times sit<>stand while maintaining SaO2 >92%O2. Pt would benefit from additional PT in whatever venue he discharges to. PT will continue to follow acutely.    Follow Up Recommendations  Home health PT;Outpatient PT;Other (comment)     Equipment Recommendations  None recommended by PT       Precautions / Restrictions Precautions Precautions: Fall Precaution Comments: monitor O2 sats with gait Restrictions Weight Bearing Restrictions: No    Mobility  Bed Mobility Overal bed mobility: Modified Independent                 Ambulation/Gait Ambulation/Gait assistance: Min guard Gait Distance (Feet): 450 Feet Assistive device: None Gait Pattern/deviations: Wide base of support Gait velocity: reduced Gait velocity interpretation: >2.62 ft/sec, indicative of community ambulatory General Gait Details: pt ambulates with slowed, shuffling gait with wide base of support for stability, no LoB with ambulatio        Balance Overall balance assessment: Needs assistance Sitting-balance support: Feet supported Sitting balance-Leahy Scale: Good     Standing balance  support: No upper extremity supported Standing balance-Leahy Scale: Fair                              Cognition Arousal/Alertness: Awake/alert Behavior During Therapy: WFL for tasks assessed/performed Overall Cognitive Status: Impaired/Different from baseline Area of Impairment: Safety/judgement                         Safety/Judgement: Decreased awareness of safety     General Comments: Impulsive       Exercises Other Exercises Other Exercises: 2 bouts of 5x sit<>stand     General Comments General comments (skin integrity, edema, etc.): with sit<>stand SaO2 dropped to 85%O2 on RA however poor pleth form and quickly rebounded ot 92%O2      Pertinent Vitals/Pain Pain Assessment: No/denies pain           PT Goals (current goals can now be found in the care plan section) Acute Rehab PT Goals Patient Stated Goal: To return home PT Goal Formulation: With patient Time For Goal Achievement: 12/16/19 Potential to Achieve Goals: Good Progress towards PT goals: Progressing toward goals    Frequency    Min 3X/week      PT Plan Current plan remains appropriate       AM-PAC PT "6 Clicks" Mobility   Outcome Measure  Help needed turning from your back to your side while in a flat bed without using bedrails?: None Help needed moving from lying on your back to sitting on the side of a flat bed without using bedrails?: None  Help needed moving to and from a bed to a chair (including a wheelchair)?: A Little Help needed standing up from a chair using your arms (e.g., wheelchair or bedside chair)?: A Little Help needed to walk in hospital room?: A Little Help needed climbing 3-5 steps with a railing? : A Lot 6 Click Score: 19    End of Session Equipment Utilized During Treatment: Gait belt Activity Tolerance: Patient tolerated treatment well Patient left: in bed;with call bell/phone within reach;with family/visitor present Nurse Communication:  Mobility status PT Visit Diagnosis: Difficulty in walking, not elsewhere classified (R26.2)     Time: 0172-4195 PT Time Calculation (min) (ACUTE ONLY): 15 min  Charges:  $Therapeutic Exercise: 8-22 mins                     Vaishali Baise B. Migdalia Dk PT, DPT Acute Rehabilitation Services Pager [phone] Office [phone]    [place] 10/30/2019, 4:50 PM

## 2019-10-30 NOTE — Progress Notes (Signed)
Progress Note  Patient Name: Allen Mayer Date of Encounter: 10/30/2019  Primary Cardiologist: Rollene Rotunda, MD   Subjective   Patient reported a brief episode of SOB this morning that lasted for seconds before resolving spontaneously. No associated chest pain or palpitations. Otherwise no complaints. He reports last cocaine use was at least 1 week ago. Discussed the importance of avoiding cocaine going forward. We discussed need for close outpatient monitoring of his INR levels for managing his coumadin and the importance of medication and follow-up compliance.   Inpatient Medications    Scheduled Meds: . aspirin EC  81 mg Oral Daily  . budesonide  0.25 mg Inhalation BID  . losartan  25 mg Oral Daily  . pantoprazole  20 mg Oral Daily  . sodium chloride flush  3 mL Intravenous Once  . warfarin  7.5 mg Oral ONCE-1600  . Warfarin - Pharmacist Dosing Inpatient   Does not apply q1600   Continuous Infusions: . heparin 1,500 Units/hr (10/29/19 2314)   PRN Meds: albuterol, baclofen, fluticasone, gabapentin, ipratropium-albuterol, nitroGLYCERIN, oxyCODONE, polyethylene glycol, senna-docusate   Vital Signs    Vitals:   10/30/19 0306 10/30/19 0309 10/30/19 0818 10/30/19 0846  BP: 100/82  118/79   Pulse: (!) 101 90 76   Resp: 19  20   Temp: 98.3 F (36.8 C)  97.8 F (36.6 C)   TempSrc:   Oral   SpO2: 93%  100% 98%  Weight: 89.2 kg     Height:        Intake/Output Summary (Last 24 hours) at 10/30/2019 1123 Last data filed at 10/30/2019 0900 Gross per 24 hour  Intake 1080 ml  Output 850 ml  Net 230 ml   Filed Weights   10/28/19 1853 10/29/19 0019 10/30/19 0306  Weight: 93.5 kg 91.2 kg 89.2 kg    Telemetry    Sinus rhythm with occasional bradycardia to the 50s and tachycardia to the 110s, one 8 beat run of NSVT noted, occasional isolated PVCs - Personally Reviewed  ECG    No new tracings - Personally Reviewed  Physical Exam   GEN: sitting in the bedside  chair in no acute distress. Fell asleep at one point in the conversation Neck: No JVD, no carotid bruits Cardiac: RRR, no murmurs, rubs, or gallops.  Respiratory: Clear to auscultation bilaterally, no wheezes/ rales/ rhonchi GI: NABS, Soft, nontender, non-distended  MS: No edema; No deformity. Neuro:  Nonfocal, moving all extremities spontaneously Psych: Normal affect   Labs    Chemistry Recent Labs  Lab 10/27/19 1501 10/29/19 0635 10/30/19 0730  NA 140 141 140  K 4.7 4.1 3.9  CL 108 104 103  CO2 22 28 28   GLUCOSE 109* 99 116*  BUN 18 19 13   CREATININE 1.34* 1.33* 1.22  CALCIUM 9.2 8.7* 8.6*  PROT  --  5.3* 5.0*  ALBUMIN  --  2.7* 2.8*  AST  --  38 47*  ALT  --  54* 69*  ALKPHOS  --  48 55  BILITOT  --  1.8* 1.1  GFRNONAA 57* 58* >60  GFRAA >60 >60 >60  ANIONGAP 10 9 9      Hematology Recent Labs  Lab 10/28/19 0712 10/29/19 0635 10/30/19 0730  WBC 5.1 5.4 4.2  RBC 5.18 4.98 4.73  HGB 14.8 14.5 13.5  HCT 47.9 45.3 42.6  MCV 92.5 91.0 90.1  MCH 28.6 29.1 28.5  MCHC 30.9 32.0 31.7  RDW 14.9 14.6 14.6  PLT 182 180 153  Cardiac EnzymesNo results for input(s): TROPONINI in the last 168 hours. No results for input(s): TROPIPOC in the last 168 hours.   BNP Recent Labs  Lab 10/27/19 2244  BNP 1,914.6*     DDimer No results for input(s): DDIMER in the last 168 hours.   Radiology    No results found.  Cardiac Studies   Echocardiogram 10/28/19: 1. There appears to be likely chronic LV thrombus at the apex. Patient  without IV access, so contrast not given. . Left ventricular ejection  fraction, by estimation, is <20%. The left ventricle has severely  decreased function. The left ventricle  demonstrates global hypokinesis. The left ventricular internal cavity size  was severely dilated. Left ventricular diastolic parameters are consistent  with Grade III diastolic dysfunction (restrictive). Elevated left  ventricular end-diastolic pressure.  2. Right  ventricular systolic function is moderately reduced. The right  ventricular size is moderately enlarged. There is moderately elevated  pulmonary artery systolic pressure.  3. Left atrial size was mild to moderately dilated.  4. Right atrial size was moderately dilated.  5. Small circumferential pericardial effusion, bordering on moderate size  posterior to RV. No echo evidence of tamponade.. The pericardial effusion  is circumferential. There is no evidence of cardiac tamponade.  6. The mitral valve is normal in structure. Mild to moderate mitral valve  regurgitation. No evidence of mitral stenosis.  7. Tricuspid valve regurgitation is moderate.  8. The aortic valve is tricuspid. Aortic valve regurgitation is not  visualized.  9. The inferior vena cava is dilated in size with <50% respiratory  variability, suggesting right atrial pressure of 15 mmHg.   Patient Profile     59 y.o. male with chronic systolic and diastolic heart failure, hypertension, COPD and polysubstance abuse (cocaine, EtOH, tobacco) admitted with acute on chronic systolic and diastolic heart failure and LV thrombus.  Assessment & Plan    1. Acute on chronic combined CHF: likely non-ischemic as LHC in 2019 was without CAD. EF <20% this admission declined from 25-30% in 2019; felt to be 2/2 polysubstance abuse - Utox positive for cocaine 10/28/19. He was diuresed with IV lasix with improvement in symptoms. Unable to add BBlocker given cocaine abuse. Issues with non-compliance. He was started on losartan 25mg  daily and BP has remained stable.  - Continue losartan - Continue to encourage abstinence from cocaine and ETOH - Encourage a low sodium diet and monitoring of daily weights  2. LV thrombus: chronic LV thrombus noted on echo this admission. Started on coumadin yesterday - INR remains 1.1. He remains on heparin until INR is therapeutic. C/f non-compliance going forward. Will need to follow-up with the coumadin  clinic regularly going forward.  - Continue coumadin with heparin bridge per pharmacy - Will need to arrange coumadin clinic follow-up prior to discharge - will tentatively plan for first visit 11/04/19  3. HTN: BP stable with addition of losartan - Managed in the context of #1  4. Labile HR: monitor shows HR is quite labile today, up to 110s then quickly down to 50s-60s without change in activity - all appears sinus. Not much to offer in the line of management - addition of AV nodal blocking agents would only worsen bradycardia - Continue to monitor for now.   5. Polysubstance abuse: ETOH, cocaine, and tobacco abuse. Encouraged abstinence. His is somewhat somnolent today and HR seems more labile. Question whether he has had access to cocaine since admission. - Will repeat Utox today - any cocaine use  prior to this hospitalization should be cleared from his urine at this time. - Continue to encourage cessation   For questions or updates, please contact [facility] Please consult [email or web address] for contact info under Cardiology/STEMI.      Signed, Beatriz Stallion, PA-C  10/30/2019, 11:23 AM   [phone]

## 2019-10-30 NOTE — Progress Notes (Signed)
Heart Failure Stewardship Pharmacist Progress Note  PCP: Medicine, [facility] PCP-Cardiologist: Rollene Rotunda, MD    HPI:  59 y.o.malewith PMH ofCOPD, HTN, GERD andsystolic and diastolicCHF,polysubstance abuse including tobacco, alcohol, cocaine, chronic pain syndrome presenting with 1 week history of shortness of breath and intermittent chest discomfort. His ECHO on 7/19 showed LVEF <20% and chronic LV thrombus at the apex. He has been receiving IV lasix (stopped 7/20) and reports improvement in symptoms (net -2L since admission).  Current HF Medications: Losartan 25 mg daily  Prior to admission HF Medications: Lisinopril 20 mg daily  Pertinent Lab Values:  Serum creatinine 1.22, BUN 13, Potassium 3.9, Sodium 140, BNP 1914.6, Magnesium 1.9  Vital Signs:  Weight: 196 lbs (admission weight: 210 lbs)  Blood pressure: 110s/70s   Heart rate: 50-100   Medication Assistance / Insurance Benefits Check: Does the patient have prescription insurance?   Pending case management check Type of insurance plan: unknown  Does the patient qualify for medication assistance through manufacturers or grants?   pending  Eligible grants and/or patient assistance programs: pending  Medication assistance applications in progress: none  Medication assistance applications approved: none  Approved medication assistance renewals will be completed by: Dr. Jenene Slicker office  Outpatient Pharmacy:  Prior to admission outpatient [facility] Is the patient willing to use [facility] TOC pharmacy at discharge?   pending Is the patient willing to transition their outpatient pharmacy to utilize a [facility] outpatient pharmacy?   pending    Assessment: 1. Acute on chronic systolic CHF (EF <21%), due to HTN, polysubstance abuse. NYHA class II symptoms. - Noted no beta blocker due to bradycardia and cocaine use. If HR stabilizes, can use carvedilol even with cocaine use (unopposed  alpha) - Continue losartan 25 mg daily. Consider transitioning to [facility] if SBP stable 110s or higher. Can enroll patient in patient assistance if needed to help with affordability - Consider starting spironolactone prior to discharge   Plan: 1) Medication changes recommended at this time: - consider switching to [facility]  2) Patient assistance application(s): - None  3)  Education  - To be completed prior to discharge  Danae Orleans, PharmD, BCPS Heart Failure Stewardship Pharmacist Phone [phone] 10/30/2019       3:23 PM

## 2019-10-30 NOTE — Plan of Care (Addendum)
Patient requesting NTG SL for left lower Chest/Rib pain 7/10, going down to stomach area. Offered Maalox but patient insisted on NTG. 2130 Denies any pain at present. Ambulating in hallway.

## 2019-10-30 NOTE — Progress Notes (Signed)
PROGRESS NOTE    Allen Mayer  RCV:[id number] DOB: 26-Sep-1960 DOA: 10/27/2019 PCP: Medicine, [facility]   Brief Narrative:  59 y.o.malewith PMH ofCOPD, HTN, GERD andsystolic and diastolicCHF,polysubstance abuse including tobacco, alcohol, cocaine, chronic pain syndrome presenting with 1 week history of shortness of breath and intermittent chest discomfort. He was diagnosed with CHF exacerbation with ejection fraction less than 20%, grade 3 diastolic dysfunction.  He was also noted to have LV thrombus therefore started on IV heparin to Coumadin bridge.   Assessment & Plan:   Principal Problem:   Acute exacerbation of CHF (congestive heart failure) (HCC) Active Problems:   Neuropathic pain   Tobacco abuse   Essential hypertension, benign   Elevated troponin   COPD (chronic obstructive pulmonary disease) (HCC)   SOB (shortness of breath)   Acute respiratory failure with hypoxia (HCC)   Acute on chronic combined systolic and diastolic CHF (congestive heart failure) (HCC)   Acute respiratory failure with hypoxia Acute on chronic combined systolic and diastolic CHF -Improved with diuretics -12/11/2017 echo EF 25-30%, grade 3 DD, mild to moderate decreased RV -10/28/19 Echo EF <20%, G3DD, mod RV dysfunction, +LV thrombus -lower dose losartan due to soft BP -Not a candidate for beta-blocker therapy  LV thrombus -Heparin to Coumadin bridge  Sinus tachycardia bradyarrhythmia. -Management per cardiology team.  COPD -As needed bronchodilators  Essential hypertension -Losartan  Polysubstance abuse -Including tobacco, cocaine, alcohol -Alcohol withdrawal protocol.  Cardiology repeating UDS  Chronic pain syndrome -Continue gabapentin.  As needed Norco.     DVT prophylaxis: Heparin to Coumadin bridge Code Status: Full code Family Communication: None  Status is: Inpatient  Remains inpatient appropriate because:Hemodynamically  unstable   Dispo: The patient is from: Home              Anticipated d/c is to: Home              Anticipated d/c date is: 1 day              Patient currently is not medically stable to d/c.  Currently awaiting heparin to Coumadin bridge       Body mass index is 26.68 kg/m.      Subjective: Feels okay no complaints, while is in the room his heart rate was ranging between 75-105, he was in normal sinus rhythm  Review of Systems Otherwise negative except as per HPI, including: General: Denies fever, chills, night sweats or unintended weight loss. Resp: Denies cough, wheezing, shortness of breath. Cardiac: Denies chest pain, palpitations, orthopnea, paroxysmal nocturnal dyspnea. GI: Denies abdominal pain, nausea, vomiting, diarrhea or constipation GU: Denies dysuria, frequency, hesitancy or incontinence MS: Denies muscle aches, joint pain or swelling Neuro: Denies headache, neurologic deficits (focal weakness, numbness, tingling), abnormal gait Psych: Denies anxiety, depression, SI/HI/AVH Skin: Denies new rashes or lesions ID: Denies sick contacts, exotic exposures, travel  Examination:  General exam: Appears calm and comfortable  Respiratory system: Clear to auscultation. Respiratory effort normal. Cardiovascular system: S1 & S2 heard, RRR. No JVD, murmurs, rubs, gallops or clicks. No pedal edema. Gastrointestinal system: Abdomen is nondistended, soft and nontender. No organomegaly or masses felt. Normal bowel sounds heard. Central nervous system: Alert and oriented. No focal neurological deficits. Extremities: Symmetric 5 x 5 power. Skin: No rashes, lesions or ulcers Psychiatry: Judgement and insight appear normal. Mood & affect appropriate.     Objective: Vitals:   10/30/19 0818 10/30/19 0846 10/30/19 1300 10/30/19 1409  BP: 118/79   117/78  Pulse: 76  (!) 51 72  Resp: 20   20  Temp: 97.8 F (36.6 C)   98.8 F (37.1 C)  TempSrc: Oral   Oral  SpO2: 100% 98%  100% 100%  Weight:      Height:        Intake/Output Summary (Last 24 hours) at 10/30/2019 1424 Last data filed at 10/30/2019 1300 Gross per 24 hour  Intake 1080 ml  Output 950 ml  Net 130 ml   Filed Weights   10/28/19 1853 10/29/19 0019 10/30/19 0306  Weight: 93.5 kg 91.2 kg 89.2 kg     Data Reviewed:   CBC: Recent Labs  Lab 10/27/19 1501 10/28/19 0712 10/29/19 0635 10/30/19 0730  WBC 5.2 5.1 5.4 4.2  HGB 15.0 14.8 14.5 13.5  HCT 48.2 47.9 45.3 42.6  MCV 89.6 92.5 91.0 90.1  PLT 201 182 180 153   Basic Metabolic Panel: Recent Labs  Lab 10/27/19 1501 10/28/19 0712 10/29/19 0635 10/30/19 0730  NA 140  --  141 140  K 4.7  --  4.1 3.9  CL 108  --  104 103  CO2 22  --  28 28  GLUCOSE 109*  --  99 116*  BUN 18  --  19 13  CREATININE 1.34*  --  1.33* 1.22  CALCIUM 9.2  --  8.7* 8.6*  MG  --  2.0 2.0 1.9  PHOS  --   --  4.2  --    GFR: Estimated Creatinine Clearance: 70.7 mL/min (by C-G formula based on SCr of 1.22 mg/dL). Liver Function Tests: Recent Labs  Lab 10/29/19 0635 10/30/19 0730  AST 38 47*  ALT 54* 69*  ALKPHOS 48 55  BILITOT 1.8* 1.1  PROT 5.3* 5.0*  ALBUMIN 2.7* 2.8*   No results for input(s): LIPASE, AMYLASE in the last 168 hours. No results for input(s): AMMONIA in the last 168 hours. Coagulation Profile: Recent Labs  Lab 10/29/19 0635 10/30/19 0730  INR 1.1 1.1   Cardiac Enzymes: No results for input(s): CKTOTAL, CKMB, CKMBINDEX, TROPONINI in the last 168 hours. BNP (last 3 results) No results for input(s): PROBNP in the last 8760 hours. HbA1C: Recent Labs    10/28/19 0712  HGBA1C 6.2*   CBG: No results for input(s): GLUCAP in the last 168 hours. Lipid Profile: Recent Labs    10/28/19 0712  CHOL 126  HDL 32*  LDLCALC 83  TRIG 57  CHOLHDL 3.9   Thyroid Function Tests: No results for input(s): TSH, T4TOTAL, FREET4, T3FREE, THYROIDAB in the last 72 hours. Anemia Panel: No results for input(s): VITAMINB12, FOLATE,  FERRITIN, TIBC, IRON, RETICCTPCT in the last 72 hours. Sepsis Labs: No results for input(s): PROCALCITON, LATICACIDVEN in the last 168 hours.  Recent Results (from the past 240 hour(s))  SARS Coronavirus 2 by RT PCR (hospital order, performed in [facility] hospital lab) Nasopharyngeal Nasopharyngeal Swab     Status: None   Collection Time: 10/27/19 10:49 PM   Specimen: Nasopharyngeal Swab  Result Value Ref Range Status   SARS Coronavirus 2 NEGATIVE NEGATIVE Final    Comment: (NOTE) SARS-CoV-2 target nucleic acids are NOT DETECTED.  The SARS-CoV-2 RNA is generally detectable in upper and lower respiratory specimens during the acute phase of infection. The lowest concentration of SARS-CoV-2 viral copies this assay can detect is 250 copies / mL. A negative result does not preclude SARS-CoV-2 infection and should not be used as the sole basis for treatment or other patient management  decisions.  A negative result may occur with improper specimen collection / handling, submission of specimen other than nasopharyngeal swab, presence of viral mutation(s) within the areas targeted by this assay, and inadequate number of viral copies (<250 copies / mL). A negative result must be combined with clinical observations, patient history, and epidemiological information.  Fact Sheet for Patients:   BoilerBrush.com.cy  Fact Sheet for Healthcare Providers: https://pope.com/  This test is not yet approved or  cleared by the [place] FDA and has been authorized for detection and/or diagnosis of SARS-CoV-2 by FDA under an Emergency Use Authorization (EUA).  This EUA will remain in effect (meaning this test can be used) for the duration of the COVID-19 declaration under Section 564(b)(1) of the Act, 21 U.S.C. section 360bbb-3(b)(1), unless the authorization is terminated or revoked sooner.  Performed at [facility], [address],  [place], [place] [address]          Radiology Studies: No results found.      Scheduled Meds: . aspirin EC  81 mg Oral Daily  . budesonide  0.25 mg Inhalation BID  . losartan  25 mg Oral Daily  . pantoprazole  20 mg Oral Daily  . sodium chloride flush  3 mL Intravenous Once  . warfarin  7.5 mg Oral ONCE-1600  . Warfarin - Pharmacist Dosing Inpatient   Does not apply q1600   Continuous Infusions: . heparin 1,500 Units/hr (10/29/19 2314)     LOS: 3 days   Time spent= 35 mins    Shacara Cozine Joline Maxcy, MD [facility]  If 7PM-7AM, please contact night-coverage  10/30/2019, 2:24 PM

## 2019-10-30 NOTE — TOC Progression Note (Signed)
Transition of Care [facility]) - Progression Note    Patient Details  Name: Sankalp Ferrell MRN: [id number] Date of Birth: 11-17-1960  [facility] CM/SW Contact  Leone Haven, RN Phone Number: 10/30/2019, 9:34 AM  Clinical Narrative:    NCM received information from [facility] RN that [facility] would like for NCM to call her.  NCM contacted [name] with [facility]  [phone], she states she is trying to help patient get into the [facility] and the [facility] needs copy of his MAR to be faxed to [phone].  NCM will fax MAR to them, patient is on coumadin also will need pt/inr check as well.        Expected Discharge Plan and Services                                                 Social Determinants of Health (SDOH) Interventions    Readmission Risk Interventions No flowsheet data found.

## 2019-10-31 ENCOUNTER — Inpatient Hospital Stay (HOSPITAL_COMMUNITY): Payer: Medicaid Other

## 2019-10-31 DIAGNOSIS — R1011 Right upper quadrant pain: Secondary | ICD-10-CM

## 2019-10-31 LAB — COMPREHENSIVE METABOLIC PANEL
ALT: 64 U/L — ABNORMAL HIGH (ref 0–44)
AST: 39 U/L (ref 15–41)
Albumin: 3.1 g/dL — ABNORMAL LOW (ref 3.5–5.0)
Alkaline Phosphatase: 57 U/L (ref 38–126)
Anion gap: 8 (ref 5–15)
BUN: 14 mg/dL (ref 6–20)
CO2: 28 mmol/L (ref 22–32)
Calcium: 8.9 mg/dL (ref 8.9–10.3)
Chloride: 102 mmol/L (ref 98–111)
Creatinine, Ser: 1.35 mg/dL — ABNORMAL HIGH (ref 0.61–1.24)
GFR calc Af Amer: >60 mL/min (ref >60)
GFR calc non Af Amer: 57 mL/min — ABNORMAL LOW (ref >60)
Glucose, Bld: 106 mg/dL — ABNORMAL HIGH (ref 70–99)
Potassium: 4.4 mmol/L (ref 3.5–5.1)
Sodium: 138 mmol/L (ref 135–145)
Total Bilirubin: 1.3 mg/dL — ABNORMAL HIGH (ref 0.3–1.2)
Total Protein: 5.7 g/dL — ABNORMAL LOW (ref 6.5–8.1)

## 2019-10-31 LAB — CBC
HCT: 45.8 % (ref 39.0–52.0)
Hemoglobin: 14.5 g/dL (ref 13.0–17.0)
MCH: 28.7 pg (ref 26.0–34.0)
MCHC: 31.7 g/dL (ref 30.0–36.0)
MCV: 90.5 fL (ref 80.0–100.0)
Platelets: 172 10*3/uL (ref 150–400)
RBC: 5.06 MIL/uL (ref 4.22–5.81)
RDW: 14.9 % (ref 11.5–15.5)
WBC: 4 10*3/uL (ref 4.0–10.5)
nRBC: 0 % (ref 0.0–0.2)

## 2019-10-31 LAB — PROTIME-INR
INR: 1.2 (ref 0.8–1.2)
Prothrombin Time: 14.7 seconds (ref 11.4–15.2)

## 2019-10-31 LAB — HEPARIN LEVEL (UNFRACTIONATED): Heparin Unfractionated: 0.52 IU/mL (ref 0.30–0.70)

## 2019-10-31 LAB — MAGNESIUM: Magnesium: 2.1 mg/dL (ref 1.7–2.4)

## 2019-10-31 MED ORDER — WARFARIN SODIUM 7.5 MG PO TABS
7.5000 mg | ORAL_TABLET | Freq: Once | ORAL | Status: AC
  Administered 2019-10-31: 7.5 mg via ORAL
  Filled 2019-10-31: qty 1

## 2019-10-31 MED ORDER — FUROSEMIDE 40 MG PO TABS
40.0000 mg | ORAL_TABLET | Freq: Every day | ORAL | Status: DC
  Administered 2019-10-31 – 2019-11-02 (×3): 40 mg via ORAL
  Filled 2019-10-31 (×3): qty 1

## 2019-10-31 NOTE — Progress Notes (Signed)
ANTICOAGULATION CONSULT NOTE   Pharmacy Consult for Heparin/Warfarin Indication: LV thrombus  No Known Allergies  Patient Measurements: Height: 6' (182.9 cm) Weight: 89.9 kg (198 lb 1.6 oz) (scale b) IBW/kg (Calculated) : 77.6 Heparin Dosing Weight: TBW  Vital Signs: Temp: 97.6 F (36.4 C) (07/22 0640) Temp Source: Oral (07/22 0640) BP: 102/81 (07/22 0127) Pulse Rate: 86 (07/22 0640)  Labs: Recent Labs    10/28/19 1000 10/28/19 2334 10/29/19 0635 10/29/19 0635 10/30/19 0730 10/31/19 0501  HGB  --   --  14.5   < > 13.5 14.5  HCT  --   --  45.3  --  42.6 45.8  PLT  --   --  180  --  153 172  LABPROT  --   --  13.9  --  13.8 14.7  INR  --   --  1.1  --  1.1 1.2  HEPARINUNFRC  --    < > 0.50  --  0.65 0.52  CREATININE  --   --  1.33*  --  1.22 1.35*  TROPONINIHS 43*  --  63*  --   --   --    < > = values in this interval not displayed.    Estimated Creatinine Clearance: 63.9 mL/min (A) (by C-G formula based on SCr of 1.35 mg/dL (H)).   Medical History: Past Medical History:  Diagnosis Date  . Acid reflux   . Arthritis   . Asthma   . Back pain   . Bronchitis   . COPD (chronic obstructive pulmonary disease) (HCC)   . Hypertension   . Irregular heart beat   . LV (left ventricular) mural thrombus   . Myocardial infarction (HCC)   . Neuropathic pain   . NICM (nonischemic cardiomyopathy) (HCC) 2019  . RVF (right ventricular failure) (HCC)   . Schizo affective schizophrenia [facility])    Assessment: 59 yo male presented with chest pain and an LV thrombus found. Patient is to start warfarin with a heparin drip bridge. Patient was not on anticoagulation PTA   Heparin level 0.52 is therapeutic on heparin 1500 units/hr. INR 1.2 is subtherapeutic but now trending up after starting warfarin on 7/20. CBC stable. No reported bleeding.   Goal of Therapy:  INR 2-3 Heparin level 0.3-0.7 units/ml Monitor platelets by anticoagulation protocol: Yes   Plan:  Continue heparin  at 1500 units/hr Give 7.5 mg warfarin PO tonight Monitor heparin level, INR, CBC, and S/S of bleeding daily   Gerrit Halls, PharmD Clinical Pharmacist  10/31/2019 7:12 AM  Please check [email or web address] for unit-specific pharmacy phone numbers.

## 2019-10-31 NOTE — Progress Notes (Signed)
PROGRESS NOTE    Allen Mayer  WNI:[id number] DOB: [birth year] DOA: 10/27/2019 PCP: Medicine, [facility]   Brief Narrative:  59 y.o.malewith PMH ofCOPD, HTN, GERD andsystolic and diastolicCHF,polysubstance abuse including tobacco, alcohol, cocaine, chronic pain syndrome presenting with 1 week history of shortness of breath and intermittent chest discomfort. He was diagnosed with CHF exacerbation with ejection fraction less than 20%, grade 3 diastolic dysfunction.  He was also noted to have LV thrombus therefore started on IV heparin to Coumadin bridge.   Assessment & Plan:   Principal Problem:   Acute exacerbation of CHF (congestive heart failure) (HCC) Active Problems:   Neuropathic pain   Tobacco abuse   Essential hypertension, benign   Elevated troponin   COPD (chronic obstructive pulmonary disease) (HCC)   SOB (shortness of breath)   Acute respiratory failure with hypoxia (HCC)   Acute on chronic combined systolic and diastolic CHF (congestive heart failure) (HCC)   Acute respiratory failure with hypoxia Acute on chronic combined systolic and diastolic CHF -Improved with diuretics -12/11/2017 echo EF 25-30%, grade 3 DD, mild to moderate decreased RV -10/28/19 Echo EF <20%, G3DD, mod RV dysfunction, +LV thrombus -lower dose losartan due to soft BP -Not a candidate for beta-blocker therapy  LV thrombus -Heparin to Coumadin bridge, INR 1.2  Sinus tachycardia bradyarrhythmia. -Management per cardiology team.  COPD -As needed bronchodilators  Essential hypertension -Losartan  Polysubstance abuse -Including tobacco, cocaine, alcohol -Alcohol withdrawal protocol.  Cardiology repeating UDS  Chronic pain syndrome -Continue gabapentin.  As needed Norco.     DVT prophylaxis: Heparin to Coumadin bridge Code Status: Full code Family Communication: None  Status is: Inpatient  Remains inpatient appropriate because:Hemodynamically  unstable   Dispo: The patient is from: Home              Anticipated d/c is to: Home              Anticipated d/c date is: 1 day              Patient currently is not medically stable to d/c.  Currently awaiting heparin to Coumadin bridge for INR to become therapeutic       Body mass index is 26.87 kg/m.      Subjective: Feels okay no complaints laying in the bed  Review of Systems Otherwise negative except as per HPI, including: General: Denies fever, chills, night sweats or unintended weight loss. Resp: Denies cough, wheezing, shortness of breath. Cardiac: Denies chest pain, palpitations, orthopnea, paroxysmal nocturnal dyspnea. GI: Denies abdominal pain, nausea, vomiting, diarrhea or constipation GU: Denies dysuria, frequency, hesitancy or incontinence MS: Denies muscle aches, joint pain or swelling Neuro: Denies headache, neurologic deficits (focal weakness, numbness, tingling), abnormal gait Psych: Denies anxiety, depression, SI/HI/AVH Skin: Denies new rashes or lesions ID: Denies sick contacts, exotic exposures, travel  Examination: Constitutional: Not in acute distress Respiratory: Clear to auscultation bilaterally Cardiovascular: Normal sinus rhythm, no rubs Abdomen: Nontender nondistended good bowel sounds Musculoskeletal: No edema noted Skin: No rashes seen Neurologic: CN 2-12 grossly intact.  And nonfocal Psychiatric: Normal judgment and insight. Alert and oriented x 3. Normal mood.   Objective: Vitals:   10/31/19 0127 10/31/19 0640 10/31/19 0740 10/31/19 0811  BP: 102/81  113/80   Pulse: 63 86 85   Resp: 20 20 20    Temp: 98.4 F (36.9 C) 97.6 F (36.4 C) 97.6 F (36.4 C)   TempSrc: Oral Oral Oral   SpO2:  93% 99% 100%  Weight: 89.9  kg     Height:        Intake/Output Summary (Last 24 hours) at 10/31/2019 1050 Last data filed at 10/31/2019 0640 Gross per 24 hour  Intake 1269.55 ml  Output 775 ml  Net 494.55 ml   Filed Weights   10/29/19  0019 10/30/19 0306 10/31/19 0127  Weight: 91.2 kg 89.2 kg 89.9 kg     Data Reviewed:   CBC: Recent Labs  Lab 10/27/19 1501 10/28/19 0712 10/29/19 0635 10/30/19 0730 10/31/19 0501  WBC 5.2 5.1 5.4 4.2 4.0  HGB 15.0 14.8 14.5 13.5 14.5  HCT 48.2 47.9 45.3 42.6 45.8  MCV 89.6 92.5 91.0 90.1 90.5  PLT 201 182 180 153 172   Basic Metabolic Panel: Recent Labs  Lab 10/27/19 1501 10/28/19 0712 10/29/19 0635 10/30/19 0730 10/31/19 0501  NA 140  --  141 140 138  K 4.7  --  4.1 3.9 4.4  CL 108  --  104 103 102  CO2 22  --  28 28 28   GLUCOSE 109*  --  99 116* 106*  BUN 18  --  19 13 14   CREATININE 1.34*  --  1.33* 1.22 1.35*  CALCIUM 9.2  --  8.7* 8.6* 8.9  MG  --  2.0 2.0 1.9 2.1  PHOS  --   --  4.2  --   --    GFR: Estimated Creatinine Clearance: 63.9 mL/min (A) (by C-G formula based on SCr of 1.35 mg/dL (H)). Liver Function Tests: Recent Labs  Lab 10/29/19 0635 10/30/19 0730 10/31/19 0501  AST 38 47* 39  ALT 54* 69* 64*  ALKPHOS 48 55 57  BILITOT 1.8* 1.1 1.3*  PROT 5.3* 5.0* 5.7*  ALBUMIN 2.7* 2.8* 3.1*   No results for input(s): LIPASE, AMYLASE in the last 168 hours. No results for input(s): AMMONIA in the last 168 hours. Coagulation Profile: Recent Labs  Lab 10/29/19 0635 10/30/19 0730 10/31/19 0501  INR 1.1 1.1 1.2   Cardiac Enzymes: No results for input(s): CKTOTAL, CKMB, CKMBINDEX, TROPONINI in the last 168 hours. BNP (last 3 results) No results for input(s): PROBNP in the last 8760 hours. HbA1C: No results for input(s): HGBA1C in the last 72 hours. CBG: No results for input(s): GLUCAP in the last 168 hours. Lipid Profile: No results for input(s): CHOL, HDL, LDLCALC, TRIG, CHOLHDL, LDLDIRECT in the last 72 hours. Thyroid Function Tests: No results for input(s): TSH, T4TOTAL, FREET4, T3FREE, THYROIDAB in the last 72 hours. Anemia Panel: No results for input(s): VITAMINB12, FOLATE, FERRITIN, TIBC, IRON, RETICCTPCT in the last 72 hours. Sepsis  Labs: No results for input(s): PROCALCITON, LATICACIDVEN in the last 168 hours.  Recent Results (from the past 240 hour(s))  SARS Coronavirus 2 by RT PCR (hospital order, performed in [facility] hospital lab) Nasopharyngeal Nasopharyngeal Swab     Status: None   Collection Time: 10/27/19 10:49 PM   Specimen: Nasopharyngeal Swab  Result Value Ref Range Status   SARS Coronavirus 2 NEGATIVE NEGATIVE Final    Comment: (NOTE) SARS-CoV-2 target nucleic acids are NOT DETECTED.  The SARS-CoV-2 RNA is generally detectable in upper and lower respiratory specimens during the acute phase of infection. The lowest concentration of SARS-CoV-2 viral copies this assay can detect is 250 copies / mL. A negative result does not preclude SARS-CoV-2 infection and should not be used as the sole basis for treatment or other patient management decisions.  A negative result may occur with improper specimen collection / handling, submission of  specimen other than nasopharyngeal swab, presence of viral mutation(s) within the areas targeted by this assay, and inadequate number of viral copies (<250 copies / mL). A negative result must be combined with clinical observations, patient history, and epidemiological information.  Fact Sheet for Patients:   BoilerBrush.com.cy  Fact Sheet for Healthcare Providers: https://pope.com/  This test is not yet approved or  cleared by the [place] FDA and has been authorized for detection and/or diagnosis of SARS-CoV-2 by FDA under an Emergency Use Authorization (EUA).  This EUA will remain in effect (meaning this test can be used) for the duration of the COVID-19 declaration under Section 564(b)(1) of the Act, 21 U.S.C. section 360bbb-3(b)(1), unless the authorization is terminated or revoked sooner.  Performed at [facility], [address] [place], [place] [address]          Radiology Studies: No  results found.      Scheduled Meds: . aspirin EC  81 mg Oral Daily  . budesonide  0.25 mg Inhalation BID  . losartan  25 mg Oral Daily  . nicotine  14 mg Transdermal Daily  . pantoprazole  20 mg Oral Daily  . sodium chloride flush  3 mL Intravenous Once  . warfarin  7.5 mg Oral ONCE-1600  . Warfarin - Pharmacist Dosing Inpatient   Does not apply q1600   Continuous Infusions: . heparin 1,500 Units/hr (10/30/19 1800)     LOS: 4 days   Time spent= 35 mins    Emeric Novinger Joline Maxcy, MD [facility]  If 7PM-7AM, please contact night-coverage  10/31/2019, 10:50 AM

## 2019-10-31 NOTE — Progress Notes (Signed)
Progress Note  Patient Name: Allen Mayer Date of Encounter: 10/31/2019  [facility] Cardiologist: Rollene Rotunda, MD   Subjective   Intermittent dyspnea and cough. He also complained of abdominal distension prior to admission. No chest pain.  Inpatient Medications    Scheduled Meds:  aspirin EC  81 mg Oral Daily   budesonide  0.25 mg Inhalation BID   losartan  25 mg Oral Daily   nicotine  14 mg Transdermal Daily   pantoprazole  20 mg Oral Daily   sodium chloride flush  3 mL Intravenous Once   warfarin  7.5 mg Oral ONCE-1600   Warfarin - Pharmacist Dosing Inpatient   Does not apply q1600   Continuous Infusions:  heparin 1,500 Units/hr (10/30/19 1800)   PRN Meds: albuterol, baclofen, fluticasone, gabapentin, ipratropium-albuterol, nitroGLYCERIN, oxyCODONE, polyethylene glycol, senna-docusate   Vital Signs    Vitals:   10/31/19 0127 10/31/19 0640 10/31/19 0740 10/31/19 0811  BP: 102/81  113/80   Pulse: 63 86 85   Resp: 20 20 20    Temp: 98.4 F (36.9 C) 97.6 F (36.4 C) 97.6 F (36.4 C)   TempSrc: Oral Oral Oral   SpO2:  93% 99% 100%  Weight: 89.9 kg     Height:        Intake/Output Summary (Last 24 hours) at 10/31/2019 0958 Last data filed at 10/31/2019 0640 Gross per 24 hour  Intake 1269.55 ml  Output 775 ml  Net 494.55 ml   Last 3 Weights 10/31/2019 10/30/2019 10/29/2019  Weight (lbs) 198 lb 1.6 oz 196 lb 11.2 oz 201 lb 1.6 oz  Weight (kg) 89.858 kg 89.223 kg 91.218 kg      Telemetry    NSR without significant ventricular ectopy  - Personally Reviewed  ECG    NSR without significant ST-T wave changes, poor R wave progression in the anterior leads, LVH - Personally Reviewed  Physical Exam   GEN: No acute distress.   Neck: No JVD Cardiac: RRR, no murmurs, rubs, or gallops.  Respiratory: Clear to auscultation bilaterally. GI: Soft, nontender, non-distended  MS: No edema; No deformity. Neuro:  Nonfocal  Psych: Normal affect   Labs     High Sensitivity Troponin:   Recent Labs  Lab 10/27/19 1501 10/27/19 2243 10/28/19 0712 10/28/19 1000 10/29/19 0635  TROPONINIHS 60* 60* 55* 43* 63*      Chemistry Recent Labs  Lab 10/29/19 0635 10/30/19 0730 10/31/19 0501  NA 141 140 138  K 4.1 3.9 4.4  CL 104 103 102  CO2 28 28 28   GLUCOSE 99 116* 106*  BUN 19 13 14   CREATININE 1.33* 1.22 1.35*  CALCIUM 8.7* 8.6* 8.9  PROT 5.3* 5.0* 5.7*  ALBUMIN 2.7* 2.8* 3.1*  AST 38 47* 39  ALT 54* 69* 64*  ALKPHOS 48 55 57  BILITOT 1.8* 1.1 1.3*  GFRNONAA 58* >60 57*  GFRAA >60 >60 >60  ANIONGAP 9 9 8      Hematology Recent Labs  Lab 10/29/19 0635 10/30/19 0730 10/31/19 0501  WBC 5.4 4.2 4.0  RBC 4.98 4.73 5.06  HGB 14.5 13.5 14.5  HCT 45.3 42.6 45.8  MCV 91.0 90.1 90.5  MCH 29.1 28.5 28.7  MCHC 32.0 31.7 31.7  RDW 14.6 14.6 14.9  PLT 180 153 172    BNP Recent Labs  Lab 10/27/19 2244  BNP 1,914.6*     DDimer No results for input(s): DDIMER in the last 168 hours.   Radiology    No results found.  Cardiac Studies   Echo 10/28/2019 1. There appears to be likely chronic LV thrombus at the apex. Patient  without IV access, so contrast not given. . Left ventricular ejection  fraction, by estimation, is <20%. The left ventricle has severely  decreased function. The left ventricle  demonstrates global hypokinesis. The left ventricular internal cavity size  was severely dilated. Left ventricular diastolic parameters are consistent  with Grade III diastolic dysfunction (restrictive). Elevated left  ventricular end-diastolic pressure.  2. Right ventricular systolic function is moderately reduced. The right  ventricular size is moderately enlarged. There is moderately elevated  pulmonary artery systolic pressure.  3. Left atrial size was mild to moderately dilated.  4. Right atrial size was moderately dilated.  5. Small circumferential pericardial effusion, bordering on moderate size  posterior to  RV. No echo evidence of tamponade.. The pericardial effusion  is circumferential. There is no evidence of cardiac tamponade.  6. The mitral valve is normal in structure. Mild to moderate mitral valve  regurgitation. No evidence of mitral stenosis.  7. Tricuspid valve regurgitation is moderate.  8. The aortic valve is tricuspid. Aortic valve regurgitation is not  visualized.  9. The inferior vena cava is dilated in size with <50% respiratory  variability, suggesting right atrial pressure of 15 mmHg.   Patient Profile     59 y.o. male with PMH of chronic systolic and diastolic CHF, HTN, COPD and polysubstance abuse presented with acute on chronic systolic and diastolic CHF and LV thrombus.   Assessment & Plan    1. Acute on chronic combined systolic and diastolic heart failure  - Echo 10/28/2019 EF < 20%, grade 3 DD, LV thrombus.   - off of home lisinopril-HCTZ. Continue losartan. Consider either low dose lasix vs spironolactone, however would only consider short Rx to make he is able to be compliant with follow up before prescribing more. His BP is unlikely to tolerate Entresto  2. LV thrombus: coumadin started on 7/20. Heparin bridge  - need close outpatient coumadin follow up. Concern of compliance.   3. HTN: BP borderline on low dose losartan.   4. Labile HR: avoid AV nodal blocking agent as his HR would spontaneously dip down to the 30s.   5. Polysubstance abuse: EtOH, tobacco and cocaine. UDT positive for cocaine again 4 days after admission      For questions or updates, please contact [facility] Please consult [email or web address] for contact info under        Signed, Azalee Course, PA  10/31/2019, 9:58 AM

## 2019-10-31 NOTE — Progress Notes (Signed)
Progress Note  Patient Name: Allen Mayer Date of Encounter: 10/31/2019  [facility] Cardiologist: Rollene Rotunda, MD  Subjective   His only complaints are neck and abdominal pain.  Abdominal pain not improved with BM.  Inpatient Medications    Scheduled Meds: . aspirin EC  81 mg Oral Daily  . budesonide  0.25 mg Inhalation BID  . losartan  25 mg Oral Daily  . nicotine  14 mg Transdermal Daily  . pantoprazole  20 mg Oral Daily  . sodium chloride flush  3 mL Intravenous Once  . Warfarin - Pharmacist Dosing Inpatient   Does not apply q1600   Continuous Infusions: . heparin 1,500 Units/hr (10/30/19 1800)   PRN Meds: albuterol, baclofen, fluticasone, gabapentin, ipratropium-albuterol, nitroGLYCERIN, oxyCODONE, polyethylene glycol, senna-docusate   Vital Signs    Vitals:   10/30/19 1409 10/30/19 1959 10/30/19 2010 10/31/19 0127  BP: 117/78 119/87  102/81  Pulse: 72 78  63  Resp: 20 20  20   Temp: 98.8 F (37.1 C) 97.7 F (36.5 C)  98.4 F (36.9 C)  TempSrc: Oral Oral  Oral  SpO2: 100% 97% 98%   Weight:    89.9 kg  Height:        Intake/Output Summary (Last 24 hours) at 10/31/2019 0429 Last data filed at 10/31/2019 0127 Gross per 24 hour  Intake 1119.5 ml  Output 600 ml  Net 519.5 ml   Last 3 Weights 10/31/2019 10/30/2019 10/29/2019  Weight (lbs) 198 lb 1.6 oz 196 lb 11.2 oz 201 lb 1.6 oz  Weight (kg) 89.858 kg 89.223 kg 91.218 kg      Telemetry    Sinus rhythm.  NSVT 12 and 16 beats - Personally Reviewed  ECG    10/28/19: Sinus rhythm.  Rate 74 bpm.  LVH.  LAD.  Absent R wave progression.   - Personally Reviewed  Physical Exam   VS:  BP 102/81 (BP Location: Left Arm)   Pulse 63   Temp 98.4 F (36.9 C) (Oral)   Resp 20   Ht 6' (1.829 m)   Wt 89.9 kg Comment: scale b  SpO2 98%   BMI 26.87 kg/m  , BMI Body mass index is 26.87 kg/m. GENERAL:  Well appearing.  No acute distress HEENT: Pupils equal round and reactive, fundi not visualized, oral  mucosa unremarkable NECK:  No jugular venous distention, waveform within normal limits, carotid upstroke brisk and symmetric, no bruits LUNGS:  Clear to auscultation bilaterally HEART:  RRR.  PMI not displaced or sustained,S1 and S2 within normal limits, no S3, no S4, no clicks, no rubs, no murmurs ABD:  Slighly distended.  +RUQ TTP. Positive bowel sounds normal in frequency in pitch, no bruits, no rebound, no guarding, no midline pulsatile mass, no hepatomegaly, no splenomegaly EXT:  2 plus pulses throughout, no edema, no cyanosis no clubbing SKIN:  No rashes no nodules NEURO:  Cranial nerves II through XII grossly intact, motor grossly intact throughout PSYCH:  Cognitively intact, oriented to person place and time   Labs    High Sensitivity Troponin:   Recent Labs  Lab 10/27/19 1501 10/27/19 2243 10/28/19 0712 10/28/19 1000 10/29/19 0635  TROPONINIHS 60* 60* 55* 43* 63*      Chemistry Recent Labs  Lab 10/27/19 1501 10/29/19 0635 10/30/19 0730  NA 140 141 140  K 4.7 4.1 3.9  CL 108 104 103  CO2 22 28 28   GLUCOSE 109* 99 116*  BUN 18 19 13   CREATININE 1.34* 1.33*  1.22  CALCIUM 9.2 8.7* 8.6*  PROT  --  5.3* 5.0*  ALBUMIN  --  2.7* 2.8*  AST  --  38 47*  ALT  --  54* 69*  ALKPHOS  --  48 55  BILITOT  --  1.8* 1.1  GFRNONAA 57* 58* >60  GFRAA >60 >60 >60  ANIONGAP 10 9 9      Hematology Recent Labs  Lab 10/28/19 0712 10/29/19 0635 10/30/19 0730  WBC 5.1 5.4 4.2  RBC 5.18 4.98 4.73  HGB 14.8 14.5 13.5  HCT 47.9 45.3 42.6  MCV 92.5 91.0 90.1  MCH 28.6 29.1 28.5  MCHC 30.9 32.0 31.7  RDW 14.9 14.6 14.6  PLT 182 180 153    BNP Recent Labs  Lab 10/27/19 2244  BNP 1,914.6*     DDimer No results for input(s): DDIMER in the last 168 hours.   Radiology    No results found.  Cardiac Studies   Echo 10/28/19:   1. There appears to be likely chronic LV thrombus at the apex. Patient  without IV access, so contrast not given. . Left ventricular  ejection  fraction, by estimation, is <20%. The left ventricle has severely  decreased function. The left ventricle  demonstrates global hypokinesis. The left ventricular internal cavity size  was severely dilated. Left ventricular diastolic parameters are consistent  with Grade III diastolic dysfunction (restrictive). Elevated left  ventricular end-diastolic pressure.  2. Right ventricular systolic function is moderately reduced. The right  ventricular size is moderately enlarged. There is moderately elevated  pulmonary artery systolic pressure.  3. Left atrial size was mild to moderately dilated.  4. Right atrial size was moderately dilated.  5. Small circumferential pericardial effusion, bordering on moderate size  posterior to RV. No echo evidence of tamponade.. The pericardial effusion  is circumferential. There is no evidence of cardiac tamponade.  6. The mitral valve is normal in structure. Mild to moderate mitral valve  regurgitation. No evidence of mitral stenosis.  7. Tricuspid valve regurgitation is moderate.  8. The aortic valve is tricuspid. Aortic valve regurgitation is not  visualized.  9. The inferior vena cava is dilated in size with <50% respiratory  variability, suggesting right atrial pressure of 15 mmHg.   Patient Profile     Mr. Allen Mayer is a 37M with chronic systolic and diastolic heart failure, hypertension, COPD and polysubstance abuse (cocaine, EtOH, tobacco) admitted with acute on chronic systolic and diastolic heart failure and LV thrombus.  Assessment & Plan    # Acute on chronic systolic and diastolic heart failure:  # Hypertension:  Prior cath in 2019 showed no CAD.  Likely 2/2 polysubstance abuse.  Volume status improved after receiving IV lasix.  He received 1 dose of losartan and his blood pressure is a little low today.  We will reduce it to 25 mg.  No beta-blocker due to his ongoing cocaine abuse and positive U tox on admission.  Warfarin  started  for LV thrombus.  Continue heparin with an INR goal of 2-3.  INR 1.2 today.  He understands that this will require close monitoring as an outpatient.  Given his nonadherence in the past and polysubstance abuse history, would only give a 1 month supply of warfarin at discharge to be refilled if he follows up regularly in the Coumadin clinic.   # Polysubstance abuse: His heart rate remains very labile.  He is asymptomatic.  Repeat Utox was again positive for cocaine despite reportedly last using a  week prior to admission. He denies use in the hospital.  # RUQ Pain:  Pain noted on exam.  Mildly elevated ALT and bilirubin.  Will get RUQ u/s.     For questions or updates, please contact [facility] Please consult [email or web address] for contact info under        Signed, Chilton Si, MD  10/31/2019, 4:29 AM

## 2019-10-31 NOTE — Progress Notes (Signed)
Heart Failure Stewardship Pharmacist Progress Note  PCP: [facility], [facility] PCP-Cardiologist: Rollene Rotunda, MD    HPI:  59 y.o.malewith PMH ofCOPD, HTN, GERD andsystolic and diastolicCHF,polysubstance abuse including tobacco, alcohol, cocaine, chronic pain syndrome presenting with 1 week history of shortness of breath and intermittent chest discomfort. His ECHO on 7/19 showed LVEF <20% and chronic LV thrombus at the apex. He has been receiving IV lasix (stopped 7/20) and reports improvement in symptoms (net -2L since admission).  Current HF Medications: Furosemide 40 mg daily Losartan 25 mg daily  Prior to admission HF Medications: Lisinopril 20 mg daily  Pertinent Lab Values: . Serum creatinine 1.35, BUN 14, Potassium 4.4, Sodium 138, BNP 1914.6, Magnesium 2.1  Vital Signs: . Weight: 198 lbs (admission weight: 210 lbs) . Blood pressure: 110s/80s  . Heart rate: 50-100   Medication Assistance / Insurance Benefits Check: Does the patient have prescription insurance?   Pending case management check Type of insurance plan: unknown  Does the patient qualify for medication assistance through manufacturers or grants?   pending  Eligible grants and/or patient assistance programs: pending  Medication assistance applications in progress: none  Medication assistance applications approved: none  Approved medication assistance renewals will be completed by: Dr. Jenene Slicker office  Outpatient Pharmacy:  Prior to admission outpatient [facility] Is the patient willing to use [facility] TOC pharmacy at discharge?   pending Is the patient willing to transition their outpatient pharmacy to utilize a [facility] outpatient pharmacy?   pending    Assessment: 1. Acute on chronic systolic CHF (EF <63%), due to HTN, polysubstance abuse. NYHA class II symptoms. - Continue furosemide 40 mg daily - Noted no beta blocker due to bradycardia and cocaine use. If HR  stabilizes, can use carvedilol even with cocaine use (unopposed alpha) - Continue losartan 25 mg daily. Consider transitioning to [facility] if SBP stable 110s or higher. Can enroll patient in patient assistance if needed to help with affordability - Consider starting spironolactone prior to discharge   Plan: 1) Medication changes recommended at this time: - consider switching to [facility]   2) Patient assistance application(s): - None  3)  Education  - To be completed prior to discharge  Danae Orleans, PharmD, BCPS Heart Failure Stewardship Pharmacist Phone [phone] 10/31/2019       11:16 AM

## 2019-10-31 NOTE — TOC Progression Note (Signed)
Transition of Care [facility]) - Progression Note    Patient Details  Name: Allen Mayer MRN: [id number] Date of Birth: 10-25-1960  Transition of Care [facility]) CM/SW Contact  Leone Haven, RN Phone Number: 10/31/2019, 5:24 PM  Clinical Narrative:    NCM received a voicemail  from [place] stating they are trying to get patient into the [facility] and they do not have a bed today, they asked if we could keep patient until a bed becomes available. NCM spoke with patient and his signficant other , Gloria at the bedside.  Patient states he spoke to the [place] and they told him that the [facility] will have a bed by tomorrow or Monday, most likely Monday.  [name] states if he is discharged he can stay with her.  NCM informed MD of this information.        Expected Discharge Plan and Services                                                 Social Determinants of Health (SDOH) Interventions    Readmission Risk Interventions No flowsheet data found.

## 2019-11-01 LAB — CBC
HCT: 47.2 % (ref 39.0–52.0)
Hemoglobin: 14.8 g/dL (ref 13.0–17.0)
MCH: 28.6 pg (ref 26.0–34.0)
MCHC: 31.4 g/dL (ref 30.0–36.0)
MCV: 91.3 fL (ref 80.0–100.0)
Platelets: 154 10*3/uL (ref 150–400)
RBC: 5.17 MIL/uL (ref 4.22–5.81)
RDW: 14.9 % (ref 11.5–15.5)
WBC: 4.5 10*3/uL (ref 4.0–10.5)
nRBC: 0 % (ref 0.0–0.2)

## 2019-11-01 LAB — MAGNESIUM: Magnesium: 2 mg/dL (ref 1.7–2.4)

## 2019-11-01 LAB — COMPREHENSIVE METABOLIC PANEL
ALT: 70 U/L — ABNORMAL HIGH (ref 0–44)
AST: 49 U/L — ABNORMAL HIGH (ref 15–41)
Albumin: 3.2 g/dL — ABNORMAL LOW (ref 3.5–5.0)
Alkaline Phosphatase: 70 U/L (ref 38–126)
Anion gap: 9 (ref 5–15)
BUN: 12 mg/dL (ref 6–20)
CO2: 26 mmol/L (ref 22–32)
Calcium: 9.2 mg/dL (ref 8.9–10.3)
Chloride: 107 mmol/L (ref 98–111)
Creatinine, Ser: 1.27 mg/dL — ABNORMAL HIGH (ref 0.61–1.24)
GFR calc Af Amer: >60 mL/min (ref >60)
GFR calc non Af Amer: >60 mL/min (ref >60)
Glucose, Bld: 110 mg/dL — ABNORMAL HIGH (ref 70–99)
Potassium: 4.4 mmol/L (ref 3.5–5.1)
Sodium: 142 mmol/L (ref 135–145)
Total Bilirubin: 1 mg/dL (ref 0.3–1.2)
Total Protein: 6 g/dL — ABNORMAL LOW (ref 6.5–8.1)

## 2019-11-01 LAB — HEPARIN LEVEL (UNFRACTIONATED): Heparin Unfractionated: 0.4 IU/mL (ref 0.30–0.70)

## 2019-11-01 LAB — PROTIME-INR
INR: 1.4 — ABNORMAL HIGH (ref 0.8–1.2)
Prothrombin Time: 17 seconds — ABNORMAL HIGH (ref 11.4–15.2)

## 2019-11-01 MED ORDER — WARFARIN SODIUM 10 MG PO TABS
10.0000 mg | ORAL_TABLET | Freq: Once | ORAL | Status: AC
  Administered 2019-11-01: 10 mg via ORAL
  Filled 2019-11-01: qty 1

## 2019-11-01 MED ORDER — CARVEDILOL 3.125 MG PO TABS
3.1250 mg | ORAL_TABLET | Freq: Two times a day (BID) | ORAL | Status: DC
  Administered 2019-11-01 – 2019-11-02 (×2): 3.125 mg via ORAL
  Filled 2019-11-01 (×2): qty 1

## 2019-11-01 MED ORDER — WARFARIN SODIUM 7.5 MG PO TABS: 7.5000 mg | ORAL_TABLET | Freq: Once | ORAL | Status: DC

## 2019-11-01 MED ORDER — METOPROLOL SUCCINATE ER 25 MG PO TB24: 25.0000 mg | ORAL_TABLET | Freq: Every day | ORAL | Status: DC

## 2019-11-01 NOTE — TOC Progression Note (Signed)
Transition of Care [facility]) - Progression Note    Patient Details  Name: Allen Mayer MRN: [id number] Date of Birth: 1960-06-17  Transition of Care [facility]) CM/SW Contact  Leone Haven, RN Phone Number: 11/01/2019, 8:39 AM  Clinical Narrative:    [facility], she states they have a [facility] for patient to go at the [facility], he has been accepted they just do not have a bed yet.  NCM informed her that his significant other , Allen Mayer states he can stay with her. Monique states ok she has patient's cell phone number to call him and keep him updated about the bed at the [facility]. Informed her if his INR is good he may be dc today or tomorrow.        Expected Discharge Plan and Services                                                 Social Determinants of Health (SDOH) Interventions    Readmission Risk Interventions No flowsheet data found.

## 2019-11-01 NOTE — Progress Notes (Signed)
Heart Failure Stewardship Pharmacist Progress Note  PCP: Medicine, [facility] PCP-Cardiologist: Rollene Rotunda, MD    HPI:  59 y.o.malewith PMH ofCOPD, HTN, GERD andsystolic and diastolicCHF,polysubstance abuse including tobacco, alcohol, cocaine, chronic pain syndrome presenting with 1 week history of shortness of breath and intermittent chest discomfort. His ECHO on 7/19 showed LVEF <20% and chronic LV thrombus at the apex. He has been receiving IV lasix (stopped 7/20) and reports improvement in symptoms (net -2L since admission).  Current HF Medications: Furosemide 40 mg daily Carvedilol 3.125 mg BID Losartan 25 mg daily  Prior to admission HF Medications: Lisinopril 20 mg daily  Pertinent Lab Values: . Serum creatinine 1.27, BUN 12, Potassium 4.4, Sodium 142, BNP 1914.6, Magnesium 2  Vital Signs: . Weight: 194 lbs (admission weight: 210 lbs) . Blood pressure: 110s/90s  . Heart rate: 80-100s   Medication Assistance / Insurance Benefits Check: Does the patient have prescription insurance?   Yes Type of insurance plan: Medicaid  Does the patient qualify for medication assistance through manufacturers or grants?   Yes  Eligible grants and/or patient assistance programs: pending HF medication plan  Medication assistance applications in progress: none  Medication assistance applications approved: none  Approved medication assistance renewals will be completed by: Dr. Jenene Slicker office  Outpatient Pharmacy:  Prior to admission outpatient [facility] Is the patient willing to use [facility] TOC pharmacy at discharge?   Yes, if his discharge is delayed until Monday Is the patient willing to transition their outpatient pharmacy to utilize a [facility] outpatient pharmacy?   No    Assessment: 1. Acute on chronic systolic CHF (EF <99%), due to HTN, polysubstance abuse. NYHA class II symptoms. - Continue furosemide 40 mg daily - Agree with starting  carvedilol 3.125 mg BID - Continue losartan 25 mg daily. Consider transitioning to [facility] if SBP stable 110s or higher. Can enroll patient in patient assistance if needed to help with affordability  - Consider starting spironolactone prior to discharge if BP can tolerate    Plan: 1) Medication changes recommended at this time: - consider switching to [facility] or adding spironolactone prior to discharge  2) Patient assistance application(s): - None  3)  Education   - Patient has been educated on current HF medications (losartan, furosemide, carvedilol) and potential additions to HF medication regimen (Entresto, spironolactone) -Patient verbalizes understanding that over the next few months, these medication doses may change and more medications may be added to optimize HF regimen -Patient has been educated on basic disease state pathophysiology and goals of therapy -Time spent (30 min)   Danae Orleans, PharmD, BCPS Heart Failure Stewardship Pharmacist Phone [phone] 11/01/2019       10:51 AM

## 2019-11-01 NOTE — Progress Notes (Addendum)
ANTICOAGULATION CONSULT NOTE   Pharmacy Consult for Heparin/Warfarin Indication: LV thrombus  No Known Allergies  Patient Measurements: Height: 6' (182.9 cm) Weight: 88 kg (194 lb) IBW/kg (Calculated) : 77.6 Heparin Dosing Weight: TBW  Vital Signs: Temp: 97.7 F (36.5 C) (07/23 0352) Temp Source: Oral (07/23 0352) BP: 120/92 (07/23 0352) Pulse Rate: 88 (07/23 0352)  Labs: Recent Labs    10/30/19 0730 10/31/19 0501  HGB 13.5 14.5  HCT 42.6 45.8  PLT 153 172  LABPROT 13.8 14.7  INR 1.1 1.2  HEPARINUNFRC 0.65 0.52  CREATININE 1.22 1.35*    Estimated Creatinine Clearance: 63.9 mL/min (A) (by C-G formula based on SCr of 1.35 mg/dL (H)).   Medical History: Past Medical History:  Diagnosis Date  . Acid reflux   . Arthritis   . Asthma   . Back pain   . Bronchitis   . COPD (chronic obstructive pulmonary disease) (HCC)   . Hypertension   . Irregular heart beat   . LV (left ventricular) mural thrombus   . Myocardial infarction (HCC)   . Neuropathic pain   . NICM (nonischemic cardiomyopathy) (HCC) 2019  . RVF (right ventricular failure) (HCC)   . Schizo affective schizophrenia [facility])    Assessment: 59 yo male presented with chest pain and an LV thrombus found. Patient is to start warfarin with a heparin drip bridge. Patient was not on anticoagulation prior to admission.    Heparin level 0.40 is therapeutic on heparin 1500 units/hr. INR 1.4 is subtherapeutic though trending up after starting warfarin on 7/20. CBC stable. No reported bleeding.   Goal of Therapy:  INR 2-3 Heparin level 0.3-0.7 units/ml Monitor platelets by anticoagulation protocol: Yes   Plan:  Continue heparin at 1500 units/hr Give 7.5 mg warfarin PO tonight Monitor heparin level, INR, CBC, and S/S of bleeding daily   Gerrit Halls, PharmD Clinical Pharmacist  11/01/2019 7:26 AM  Please check [email or web address] for unit-specific pharmacy phone numbers.   Addendum: Spoke with Dr. Nelson Chimes and will give a  one time dose of warfarin 10mg  today.   , PharmD Clinical Pharmacist

## 2019-11-01 NOTE — Plan of Care (Addendum)
Patient off telemetry and refuses to have monitor put back on.  Patient agreeable to wear monitor and placed back on showing NSR.

## 2019-11-01 NOTE — Progress Notes (Signed)
PROGRESS NOTE    Allen Mayer  PXT:[id number] DOB: Aug 14, 1960 DOA: 10/27/2019 PCP: Medicine, [facility]   Brief Narrative:  59 y.o.malewith PMH ofCOPD, HTN, GERD andsystolic and diastolicCHF,polysubstance abuse including tobacco, alcohol, cocaine, chronic pain syndrome presenting with 1 week history of shortness of breath and intermittent chest discomfort. He was diagnosed with CHF exacerbation with ejection fraction less than 20%, grade 3 diastolic dysfunction.  He was also noted to have LV thrombus therefore started on IV heparin to Coumadin bridge.   Assessment & Plan:   Principal Problem:   Acute exacerbation of CHF (congestive heart failure) (HCC) Active Problems:   Neuropathic pain   Tobacco abuse   Essential hypertension, benign   Elevated troponin   COPD (chronic obstructive pulmonary disease) (HCC)   SOB (shortness of breath)   Acute respiratory failure with hypoxia (HCC)   Acute on chronic combined systolic and diastolic CHF (congestive heart failure) (HCC)   LV (left ventricular) mural thrombus   RUQ pain   Acute respiratory failure with hypoxia Acute on chronic combined systolic and diastolic CHF -Improved with diuretics -12/11/2017 echo EF 25-30%, grade 3 DD, mild to moderate decreased RV -10/28/19 Echo EF <20%, G3DD, mod RV dysfunction, +LV thrombus -lower dose losartan due to soft BP -Not a candidate for beta-blocker therapy  LV thrombus -Heparin to Coumadin bridge, INR still 1.4  Sinus tachycardia bradyarrhythmia. -Management per cardiology team.  COPD -As needed bronchodilators  Essential hypertension -Losartan  Polysubstance abuse -Including tobacco, cocaine, alcohol -Alcohol withdrawal protocol.  Cardiology repeating UDS  Chronic pain syndrome -Continue gabapentin.  As needed Norco.  Patient is extremely eager to leave the hospital, hopefully he will not leave AGAINST MEDICAL ADVICE.   DVT prophylaxis: Heparin  to Coumadin bridge Code Status: Full code Family Communication: None  Status is: Inpatient  Remains inpatient appropriate because:Hemodynamically unstable   Dispo: The patient is from: Home              Anticipated d/c is to: Home              Anticipated d/c date is: 1 day              Patient currently is not medically stable to d/c. Currently awaiting INR to become therapeutic, on heparin to Coumadin bridge      Body mass index is 26.31 kg/m.      Subjective: Patient is very eager to leave the hospital and continues to ask he wants to leave the hospital today I explained to him that his INR needs to become therapeutic before he can safely go home. He is ambulating in the halls without any issues. Denies any complaints to me.  Review of Systems Otherwise negative except as per HPI, including: General: Denies fever, chills, night sweats or unintended weight loss. Resp: Denies cough, wheezing, shortness of breath. Cardiac: Denies chest pain, palpitations, orthopnea, paroxysmal nocturnal dyspnea. GI: Denies abdominal pain, nausea, vomiting, diarrhea or constipation GU: Denies dysuria, frequency, hesitancy or incontinence MS: Denies muscle aches, joint pain or swelling Neuro: Denies headache, neurologic deficits (focal weakness, numbness, tingling), abnormal gait Psych: Denies anxiety, depression, SI/HI/AVH Skin: Denies new rashes or lesions ID: Denies sick contacts, exotic exposures, travel  Examination: Constitutional: Not in acute distress Respiratory: Clear to auscultation bilaterally Cardiovascular: Normal sinus rhythm, no rubs Abdomen: Nontender nondistended good bowel sounds Musculoskeletal: No edema noted Skin: No rashes seen Neurologic: CN 2-12 grossly intact.  And nonfocal Psychiatric: Normal judgment and insight. Alert and  oriented x 3. Normal mood. Objective: Vitals:   10/31/19 2027 10/31/19 2037 11/01/19 0352 11/01/19 0837  BP: 117/84  (!) 120/92    Pulse: 82  88   Resp: 17  16   Temp: 97.6 F (36.4 C)  97.7 F (36.5 C)   TempSrc: Oral  Oral   SpO2: 100% 90% 99% 99%  Weight:   88 kg   Height:        Intake/Output Summary (Last 24 hours) at 11/01/2019 1059 Last data filed at 11/01/2019 0900 Gross per 24 hour  Intake 873.74 ml  Output 1900 ml  Net -1026.26 ml   Filed Weights   10/30/19 0306 10/31/19 0127 11/01/19 0352  Weight: 89.2 kg 89.9 kg 88 kg     Data Reviewed:   CBC: Recent Labs  Lab 10/28/19 0712 10/29/19 0635 10/30/19 0730 10/31/19 0501 11/01/19 0814  WBC 5.1 5.4 4.2 4.0 4.5  HGB 14.8 14.5 13.5 14.5 14.8  HCT 47.9 45.3 42.6 45.8 47.2  MCV 92.5 91.0 90.1 90.5 91.3  PLT 182 180 153 172 154   Basic Metabolic Panel: Recent Labs  Lab 10/27/19 1501 10/28/19 0712 10/29/19 0635 10/30/19 0730 10/31/19 0501 11/01/19 0814  NA 140  --  141 140 138 142  K 4.7  --  4.1 3.9 4.4 4.4  CL 108  --  104 103 102 107  CO2 22  --  28 28 28 26   GLUCOSE 109*  --  99 116* 106* 110*  BUN 18  --  19 13 14 12   CREATININE 1.34*  --  1.33* 1.22 1.35* 1.27*  CALCIUM 9.2  --  8.7* 8.6* 8.9 9.2  MG  --  2.0 2.0 1.9 2.1 2.0  PHOS  --   --  4.2  --   --   --    GFR: Estimated Creatinine Clearance: 67.9 mL/min (A) (by C-G formula based on SCr of 1.27 mg/dL (H)). Liver Function Tests: Recent Labs  Lab 10/29/19 0635 10/30/19 0730 10/31/19 0501 11/01/19 0814  AST 38 47* 39 49*  ALT 54* 69* 64* 70*  ALKPHOS 48 55 57 70  BILITOT 1.8* 1.1 1.3* 1.0  PROT 5.3* 5.0* 5.7* 6.0*  ALBUMIN 2.7* 2.8* 3.1* 3.2*   No results for input(s): LIPASE, AMYLASE in the last 168 hours. No results for input(s): AMMONIA in the last 168 hours. Coagulation Profile: Recent Labs  Lab 10/29/19 0635 10/30/19 0730 10/31/19 0501 11/01/19 0814  INR 1.1 1.1 1.2 1.4*   Cardiac Enzymes: No results for input(s): CKTOTAL, CKMB, CKMBINDEX, TROPONINI in the last 168 hours. BNP (last 3 results) No results for input(s): PROBNP in the last 8760  hours. HbA1C: No results for input(s): HGBA1C in the last 72 hours. CBG: No results for input(s): GLUCAP in the last 168 hours. Lipid Profile: No results for input(s): CHOL, HDL, LDLCALC, TRIG, CHOLHDL, LDLDIRECT in the last 72 hours. Thyroid Function Tests: No results for input(s): TSH, T4TOTAL, FREET4, T3FREE, THYROIDAB in the last 72 hours. Anemia Panel: No results for input(s): VITAMINB12, FOLATE, FERRITIN, TIBC, IRON, RETICCTPCT in the last 72 hours. Sepsis Labs: No results for input(s): PROCALCITON, LATICACIDVEN in the last 168 hours.  Recent Results (from the past 240 hour(s))  SARS Coronavirus 2 by RT PCR (hospital order, performed in [facility] hospital lab) Nasopharyngeal Nasopharyngeal Swab     Status: None   Collection Time: 10/27/19 10:49 PM   Specimen: Nasopharyngeal Swab  Result Value Ref Range Status   SARS Coronavirus 2  NEGATIVE NEGATIVE Final    Comment: (NOTE) SARS-CoV-2 target nucleic acids are NOT DETECTED.  The SARS-CoV-2 RNA is generally detectable in upper and lower respiratory specimens during the acute phase of infection. The lowest concentration of SARS-CoV-2 viral copies this assay can detect is 250 copies / mL. A negative result does not preclude SARS-CoV-2 infection and should not be used as the sole basis for treatment or other patient management decisions.  A negative result may occur with improper specimen collection / handling, submission of specimen other than nasopharyngeal swab, presence of viral mutation(s) within the areas targeted by this assay, and inadequate number of viral copies (<250 copies / mL). A negative result must be combined with clinical observations, patient history, and epidemiological information.  Fact Sheet for Patients:   BoilerBrush.com.cy  Fact Sheet for Healthcare Providers: https://pope.com/  This test is not yet approved or  cleared by the [place] FDA and has  been authorized for detection and/or diagnosis of SARS-CoV-2 by FDA under an Emergency Use Authorization (EUA).  This EUA will remain in effect (meaning this test can be used) for the duration of the COVID-19 declaration under Section 564(b)(1) of the Act, 21 U.S.C. section 360bbb-3(b)(1), unless the authorization is terminated or revoked sooner.  Performed at [facility], [address] [place], [place] [address]          Radiology Studies: US Abdomen Limited RUQ  Result Date: 10/31/2019 CLINICAL DATA:  Right upper quadrant pain. EXAM: ULTRASOUND ABDOMEN LIMITED RIGHT UPPER QUADRANT COMPARISON:  None. FINDINGS: Gallbladder: No gallstones or wall thickening visualized in (3.9 mm). No sonographic Murphy sign noted by sonographer. Common bile duct: Diameter: 4.4 mm Liver: No focal lesion identified. Within normal limits in parenchymal echogenicity. Portal vein is patent on color Doppler imaging with normal direction of blood flow towards the liver. Other: None. IMPRESSION: Normal right upper quadrant ultrasound. Electronically Signed   By: Aram Candela M.D.   On: 10/31/2019 19:42        Scheduled Meds: . aspirin EC  81 mg Oral Daily  . budesonide  0.25 mg Inhalation BID  . furosemide  40 mg Oral Daily  . losartan  25 mg Oral Daily  . nicotine  14 mg Transdermal Daily  . pantoprazole  20 mg Oral Daily  . sodium chloride flush  3 mL Intravenous Once  . warfarin  7.5 mg Oral ONCE-1600  . Warfarin - Pharmacist Dosing Inpatient   Does not apply q1600   Continuous Infusions: . heparin 1,500 Units/hr (11/01/19 0615)     LOS: 5 days   Time spent= 15 mins    Mikhaela Zaugg Joline Maxcy, MD [facility]  If 7PM-7AM, please contact night-coverage  11/01/2019, 10:59 AM

## 2019-11-01 NOTE — Progress Notes (Addendum)
Progress Note  Patient Name: Allen Mayer Date of Encounter: 11/01/2019  [facility] Cardiologist: Rollene Rotunda, MD  Subjective   Feeling better today.  He just wants to go home.  Otherwise well.   Inpatient Medications    Scheduled Meds: . aspirin EC  81 mg Oral Daily  . budesonide  0.25 mg Inhalation BID  . furosemide  40 mg Oral Daily  . losartan  25 mg Oral Daily  . nicotine  14 mg Transdermal Daily  . pantoprazole  20 mg Oral Daily  . sodium chloride flush  3 mL Intravenous Once  . warfarin  7.5 mg Oral ONCE-1600  . Warfarin - Pharmacist Dosing Inpatient   Does not apply q1600   Continuous Infusions: . heparin 1,500 Units/hr (11/01/19 0615)   PRN Meds: albuterol, baclofen, fluticasone, gabapentin, ipratropium-albuterol, nitroGLYCERIN, oxyCODONE, polyethylene glycol, senna-docusate   Vital Signs    Vitals:   10/31/19 2027 10/31/19 2037 11/01/19 0352 11/01/19 0837  BP: 117/84  (!) 120/92   Pulse: 82  88   Resp: 17  16   Temp: 97.6 F (36.4 C)  97.7 F (36.5 C)   TempSrc: Oral  Oral   SpO2: 100% 90% 99% 99%  Weight:   88 kg   Height:        Intake/Output Summary (Last 24 hours) at 11/01/2019 1118 Last data filed at 11/01/2019 0900 Gross per 24 hour  Intake 873.74 ml  Output 1900 ml  Net -1026.26 ml   Last 3 Weights 11/01/2019 10/31/2019 10/30/2019  Weight (lbs) 194 lb 198 lb 1.6 oz 196 lb 11.2 oz  Weight (kg) 87.998 kg 89.858 kg 89.223 kg      Telemetry    Sinus rhythm.  NSVT 12 and 16 beats - Personally Reviewed  ECG    10/28/19: Sinus rhythm.  Rate 74 bpm.  LVH.  LAD.  Absent R wave progression.   - Personally Reviewed  Physical Exam   VS:  BP (!) 120/92 (BP Location: Left Arm)   Pulse 88   Temp 97.7 F (36.5 C) (Oral)   Resp 16   Ht 6' (1.829 m)   Wt 88 kg   SpO2 99%   BMI 26.31 kg/m  , BMI Body mass index is 26.31 kg/m. GENERAL:  Well appearing HEENT: Pupils equal round and reactive, fundi not visualized, oral mucosa  unremarkable NECK:  No jugular venous distention, waveform within normal limits, carotid upstroke brisk and symmetric, no bruits LUNGS:  Clear to auscultation bilaterally HEART:  RRR.  PMI not displaced or sustained,S1 and S2 within normal limits, no S3, no S4, no clicks, no rubs, no murmurs ABD:  Flat, positive bowel sounds normal in frequency in pitch, no bruits, no rebound, no guarding, no midline pulsatile mass, no hepatomegaly, no splenomegaly EXT:  2 plus pulses throughout, no edema, no cyanosis no clubbing SKIN:  No rashes no nodules NEURO:  Cranial nerves II through XII grossly intact, motor grossly intact throughout PSYCH:  Cognitively intact, oriented to person place and time   Labs    High Sensitivity Troponin:   Recent Labs  Lab 10/27/19 1501 10/27/19 2243 10/28/19 0712 10/28/19 1000 10/29/19 0635  TROPONINIHS 60* 60* 55* 43* 63*      Chemistry Recent Labs  Lab 10/30/19 0730 10/31/19 0501 11/01/19 0814  NA 140 138 142  K 3.9 4.4 4.4  CL 103 102 107  CO2 28 28 26   GLUCOSE 116* 106* 110*  BUN 13 14 12   CREATININE  1.22 1.35* 1.27*  CALCIUM 8.6* 8.9 9.2  PROT 5.0* 5.7* 6.0*  ALBUMIN 2.8* 3.1* 3.2*  AST 47* 39 49*  ALT 69* 64* 70*  ALKPHOS 55 57 70  BILITOT 1.1 1.3* 1.0  GFRNONAA >60 57* >60  GFRAA >60 >60 >60  ANIONGAP 9 8 9      Hematology Recent Labs  Lab 10/30/19 0730 10/31/19 0501 11/01/19 0814  WBC 4.2 4.0 4.5  RBC 4.73 5.06 5.17  HGB 13.5 14.5 14.8  HCT 42.6 45.8 47.2  MCV 90.1 90.5 91.3  MCH 28.5 28.7 28.6  MCHC 31.7 31.7 31.4  RDW 14.6 14.9 14.9  PLT 153 172 154    BNP Recent Labs  Lab 10/27/19 2244  BNP 1,914.6*     DDimer No results for input(s): DDIMER in the last 168 hours.   Radiology    10/29/19 Abdomen Limited RUQ  Result Date: 10/31/2019 CLINICAL DATA:  Right upper quadrant pain. EXAM: ULTRASOUND ABDOMEN LIMITED RIGHT UPPER QUADRANT COMPARISON:  None. FINDINGS: Gallbladder: No gallstones or wall thickening visualized in  (3.9 mm). No sonographic Murphy sign noted by sonographer. Common bile duct: Diameter: 4.4 mm Liver: No focal lesion identified. Within normal limits in parenchymal echogenicity. Portal vein is patent on color Doppler imaging with normal direction of blood flow towards the liver. Other: None. IMPRESSION: Normal right upper quadrant ultrasound. Electronically Signed   By: 11/02/2019 M.D.   On: 10/31/2019 19:42    Cardiac Studies   Echo 10/28/19:   1. There appears to be likely chronic LV thrombus at the apex. Patient  without IV access, so contrast not given. . Left ventricular ejection  fraction, by estimation, is <20%. The left ventricle has severely  decreased function. The left ventricle  demonstrates global hypokinesis. The left ventricular internal cavity size  was severely dilated. Left ventricular diastolic parameters are consistent  with Grade III diastolic dysfunction (restrictive). Elevated left  ventricular end-diastolic pressure.  2. Right ventricular systolic function is moderately reduced. The right  ventricular size is moderately enlarged. There is moderately elevated  pulmonary artery systolic pressure.  3. Left atrial size was mild to moderately dilated.  4. Right atrial size was moderately dilated.  5. Small circumferential pericardial effusion, bordering on moderate size  posterior to RV. No echo evidence of tamponade.. The pericardial effusion  is circumferential. There is no evidence of cardiac tamponade.  6. The mitral valve is normal in structure. Mild to moderate mitral valve  regurgitation. No evidence of mitral stenosis.  7. Tricuspid valve regurgitation is moderate.  8. The aortic valve is tricuspid. Aortic valve regurgitation is not  visualized.  9. The inferior vena cava is dilated in size with <50% respiratory  variability, suggesting right atrial pressure of 15 mmHg.   Patient Profile     Allen Mayer is a 14M with chronic systolic and  diastolic heart failure, hypertension, COPD and polysubstance abuse (cocaine, EtOH, tobacco) admitted with acute on chronic systolic and diastolic heart failure and LV thrombus.  Assessment & Plan    # Acute on chronic systolic and diastolic heart failure:  # Hypertension:  # NSVT: Prior cath in 2019 showed no CAD.  Likely 2/2 polysubstance abuse.  Volume status stable on oral lasix.  Continue losartan  improved after receiving IV lasix.  Blood pressure stable.  He was not started on a beta-blocker due to his ongoing cocaine abuse and positive U tox on admission.  He has runs of NSVT.  Will try carvedilol 3.125mg   bid.  No metoprolol given cocaine use.  Not an ICD/Lifevest candidate with ongoing drug use.  Warfarin started  for LV thrombus.  Continue heparin with an INR goal of 2-3.  INR up to 1.4 today.  He is very anxious to go.  If his INR is up to 1.8 tomorrow he would be OK to go with plans to f/u in coumadin clinic next week.  Continue anticoagulation for 3 months.  Repeat echo in 3 months.    He understands that this will require close monitoring as an outpatient.  Given his nonadherence in the past and polysubstance abuse history, would only give a 1 month supply of warfarin at discharge to be refilled if he follows up regularly in the Coumadin clinic.  Stop aspirin.  # Polysubstance abuse: His heart rate remains very labile.  He is asymptomatic.  Repeat Utox was again positive for cocaine despite reportedly last using a week prior to admission. He denies use in the hospital.  Carvedilol added today.  Will watch HR/BP closely.  # RUQ Pain:  Resolved.  RUQ u/s negative.       For questions or updates, please contact [facility] Please consult [email or web address] for contact info under        Signed, Chilton Si, MD  11/01/2019, 11:18 AM

## 2019-11-01 NOTE — Discharge Instructions (Signed)
Heart Failure Education: 1. Weigh yourself EVERY morning after you go to the bathroom but before you eat or drink anything. Write this number down in a weight log/diary. If you gain 3 pounds overnight or 5 pounds in a week, call the office. 2. Take your medicines as prescribed. If you have concerns about your medications, please call us before you stop taking them.  3. Eat low salt foods--Limit salt (sodium) to 2000 mg per day. This will help prevent your body from holding onto fluid. Read food labels as many processed foods have a lot of sodium, especially canned goods and prepackaged meats. If you would like some assistance choosing low sodium foods, we would be happy to set you up with a nutritionist. 4. Stay as active as you can everyday. Staying active will give you more energy and make your muscles stronger. Start with 5 minutes at a time and work your way up to 30 minutes a day. Break up your activities--do some in the morning and some in the afternoon. Start with 3 days per week and work your way up to 5 days as you can.  If you have chest pain, feel short of breath, dizzy, or lightheaded, STOP. If you don't feel better after a short rest, call 911. If you do feel better, call the office to let us know you have symptoms with exercise. 5. Limit all fluids for the day to less than 2 liters. Fluid includes all drinks, coffee, juice, ice chips, soup, jello, and all other liquids.  Information on my medicine - Coumadin   (Warfarin)  Why was Coumadin prescribed for you? Coumadin was prescribed for you because you have a blood clot or a medical condition that can cause an increased risk of forming blood clots. Blood clots can cause serious health problems by blocking the flow of blood to the heart, lung, or brain. Coumadin can prevent harmful blood clots from forming. As a reminder your indication for Coumadin is: left ventricular thrombus   What test will check on my response to Coumadin? While on  Coumadin (warfarin) you will need to have an INR test regularly to ensure that your dose is keeping you in the desired range. The INR (international normalized ratio) number is calculated from the result of the laboratory test called prothrombin time (PT).  If an INR APPOINTMENT HAS NOT ALREADY BEEN MADE FOR YOU please schedule an appointment to have this lab work done by your health care provider within 7 days. Your INR goal is usually a number between:  2 to 3 or your provider may give you a more narrow range like 2-2.5.  Ask your health care provider during an office visit what your goal INR is.  What  do you need to  know  About  COUMADIN? Take Coumadin (warfarin) exactly as prescribed by your healthcare provider about the same time each day.  DO NOT stop taking without talking to the doctor who prescribed the medication.  Stopping without other blood clot prevention medication to take the place of Coumadin may increase your risk of developing a new clot or stroke.  Get refills before you run out.  What do you do if you miss a dose? If you miss a dose, take it as soon as you remember on the same day then continue your regularly scheduled regimen the next day.  Do not take two doses of Coumadin at the same time.  Important Safety Information A possible side effect of Coumadin (Warfarin)  is an increased risk of bleeding. You should call your healthcare provider right away if you experience any of the following: ? Bleeding from an injury or your nose that does not stop. ? Unusual colored urine (red or dark brown) or unusual colored stools (red or black). ? Unusual bruising for unknown reasons. ? A serious fall or if you hit your head (even if there is no bleeding).  Some foods or medicines interact with Coumadin (warfarin) and might alter your response to warfarin. To help avoid this: ? Eat a balanced diet, maintaining a consistent amount of Vitamin K. ? Notify your provider about major diet  changes you plan to make. ? Avoid alcohol or limit your intake to 1 drink for women and 2 drinks for men per day. (1 drink is 5 oz. wine, 12 oz. beer, or 1.5 oz. liquor.)  Make sure that ANY health care provider who prescribes medication for you knows that you are taking Coumadin (warfarin).  Also make sure the healthcare provider who is monitoring your Coumadin knows when you have started a new medication including herbals and non-prescription products.  Coumadin (Warfarin)  Major Drug Interactions  Increased Warfarin Effect Decreased Warfarin Effect  Alcohol (large quantities) Antibiotics (esp. Septra/Bactrim, Flagyl, Cipro) Amiodarone (Cordarone) Aspirin (ASA) Cimetidine (Tagamet) Megestrol (Megace) NSAIDs (ibuprofen, naproxen, etc.) Piroxicam (Feldene) Propafenone (Rythmol SR) Propranolol (Inderal) Isoniazid (INH) Posaconazole (Noxafil) Barbiturates (Phenobarbital) Carbamazepine (Tegretol) Chlordiazepoxide (Librium) Cholestyramine (Questran) Griseofulvin Oral Contraceptives Rifampin Sucralfate (Carafate) Vitamin K   Coumadin (Warfarin) Major Herbal Interactions  Increased Warfarin Effect Decreased Warfarin Effect  Garlic Ginseng Ginkgo biloba Coenzyme Q10 Green tea St. John's wort    Coumadin (Warfarin) FOOD Interactions  Eat a consistent number of servings per week of foods HIGH in Vitamin K (1 serving =  cup)  Collards (cooked, or boiled & drained) Kale (cooked, or boiled & drained) Mustard greens (cooked, or boiled & drained) Parsley *serving size only =  cup Spinach (cooked, or boiled & drained) Swiss chard (cooked, or boiled & drained) Turnip greens (cooked, or boiled & drained)  Eat a consistent number of servings per week of foods MEDIUM-HIGH in Vitamin K (1 serving = 1 cup)  Asparagus (cooked, or boiled & drained) Broccoli (cooked, boiled & drained, or raw & chopped) Brussel sprouts (cooked, or boiled & drained) *serving size only =  cup Lettuce,  raw (green leaf, endive, romaine) Spinach, raw Turnip greens, raw & chopped   These websites have more information on Coumadin (warfarin):  http://www.king-russell.com/; https://www.hines.net/;

## 2019-11-01 NOTE — Progress Notes (Signed)
Occupational Therapy Treatment Patient Details Name: Allen Mayer MRN: [id number] DOB: 10/11/1960 Today's Date: 11/01/2019    History of present illness 59 yo male with onset of CHF and SOB was admitted to hosp, noted cardiomegaly, light headed feelings, chest pain, and pulm vascular congestion.  PMHx:  smoker, acid reflux, asthma, OA, COPD, bronchitis, LVMT, MI, neuropathic pain, NICM, RVF, schizoaffective schizophrenia   OT comments  Patient met seated EOB, very lethargic and falling asleep. OT treatment session intended to focus on safety with functional mobility, transfers, and self-care but patient grossly non-receptive to education stating "I told ya'll I'm not working with therapy right now. I need to go to the bathroom." Patient declined assistance from this therapist proceeding to grab IV pole and ambulate to bathroom with gown getting caught under wheel of IV pole. Patient with no LOB despite decreased safety awareness and impulsivity. OT remained in room to ensure safe return to supine. Call bell within reach, bed alarm activated, and all needs within reach. RN aware of patient status. This therapist observed patient ambulating in hallway without assistance or AD earlier this date. OT to sign off at this time with recommendation for ADL completion with nursing staff.    Follow Up Recommendations  No OT follow up;Supervision - Intermittent    Equipment Recommendations  None recommended by OT    Recommendations for Other Services      Precautions / Restrictions Precautions Precautions: Fall Restrictions Weight Bearing Restrictions: No       Mobility Bed Mobility Overal bed mobility: Modified Independent                Transfers Overall transfer level: Needs assistance   Transfers: Sit to/from Stand;Stand Pivot Transfers Sit to Stand: Supervision Stand pivot transfers: Supervision       General transfer comment: Supervision A this date for safety. Pt.  holding onto IV pole.     Balance Overall balance assessment: Needs assistance Sitting-balance support: Feet supported Sitting balance-Leahy Scale: Good     Standing balance support: No upper extremity supported Standing balance-Leahy Scale: Fair                             ADL either performed or assessed with clinical judgement   ADL                           Toilet Transfer: Copy Details (indicate cue type and reason): Pt. ambulated to commode in bathroom holding onto IV pole with supervision A for safety. Poor safety awareness and decreased compliance.  Toileting- Clothing Manipulation and Hygiene: Supervision/safety Toileting - Clothing Manipulation Details (indicate cue type and reason): Supervision A for safety in standing.      Functional mobility during ADLs: Supervision/safety General ADL Comments: Short distance to commode in bathroom without external assist or use of DME.  Sup A for safety as pt. very lethargic with decreased safety awareness.      Vision       Perception     Praxis      Cognition Arousal/Alertness: Lethargic Behavior During Therapy: Restless;Impulsive Overall Cognitive Status: Impaired/Different from baseline Area of Impairment: Safety/judgement                         Safety/Judgement: Decreased awareness of safety     General Comments: Impulsive  Exercises     Shoulder Instructions       General Comments Pt. met seated 1/2 off EOB very lethargic and falling asleep. Pt. very impulsive with decreased safety awareness and non-compliant with OT this date.     Pertinent Vitals/ Pain       Pain Assessment: No/denies pain  Home Living                                          Prior Functioning/Environment              Frequency  Min 2X/week        Progress Toward Goals  OT Goals(current goals can now be found in the care plan  section)  Progress towards OT goals:  (Patient non-receptive to OT treatment/education. )  Acute Rehab OT Goals Patient Stated Goal: To return home ADL Goals Additional ADL Goal #1: Patient will recall and incorporate 3 energy conservation strategies during morning BADLs. Additional ADL Goal #2: Patient will demonstrate increased safety awareness in prep for completion of BADLs to decrease risk of falls.  Plan Other (comment) (Patient non-receptive to OT edcuation. Will sign off. )    Co-evaluation                 AM-PAC OT "6 Clicks" Daily Activity     Outcome Measure   Help from another person eating meals?: None Help from another person taking care of personal grooming?: A Little Help from another person toileting, which includes using toliet, bedpan, or urinal?: A Little Help from another person bathing (including washing, rinsing, drying)?: A Little Help from another person to put on and taking off regular upper body clothing?: None Help from another person to put on and taking off regular lower body clothing?: None 6 Click Score: 21    End of Session Equipment Utilized During Treatment:  (Pt. refused gait belt. )  OT Visit Diagnosis: Unsteadiness on feet (R26.81)   Activity Tolerance Patient limited by lethargy   Patient Left in bed;with call bell/phone within reach;with bed alarm set   Nurse Communication Other (comment) (Pt. status)        Time: 7048-8891 OT Time Calculation (min): 9 min  Charges: OT General Charges $OT Visit: 1 Visit OT Treatments $Self Care/Home Management : 8-22 mins  Ramzi Brathwaite H. OTR/L Supplemental OT, Department of rehab services [phone]   Rubi Tooley R H. 11/01/2019, 2:49 PM

## 2019-11-01 NOTE — Progress Notes (Signed)
PT Cancellation Note  Patient Details Name: Allen Mayer MRN: [id number] DOB: 1960-10-24   Cancelled Treatment:    Reason Eval/Treat Not Completed: (P) Patient declined, no reason specified When asked why he doesn't want to work with therapy he reports "I just don't feel like it." PT will follow back later this afternoon as able.    Pattye Meda B. Beverely Risen PT, DPT Acute Rehabilitation Services Pager [phone] Office [phone]      Elon Alas Fleet 11/01/2019, 1:33 PM

## 2019-11-02 LAB — CBC
HCT: 45 % (ref 39.0–52.0)
Hemoglobin: 14 g/dL (ref 13.0–17.0)
MCH: 28.2 pg (ref 26.0–34.0)
MCHC: 31.1 g/dL (ref 30.0–36.0)
MCV: 90.7 fL (ref 80.0–100.0)
Platelets: 160 10*3/uL (ref 150–400)
RBC: 4.96 MIL/uL (ref 4.22–5.81)
RDW: 14.8 % (ref 11.5–15.5)
WBC: 4.2 10*3/uL (ref 4.0–10.5)
nRBC: 0 % (ref 0.0–0.2)

## 2019-11-02 LAB — BASIC METABOLIC PANEL
Anion gap: 10 (ref 5–15)
BUN: 13 mg/dL (ref 6–20)
CO2: 28 mmol/L (ref 22–32)
Calcium: 9.2 mg/dL (ref 8.9–10.3)
Chloride: 102 mmol/L (ref 98–111)
Creatinine, Ser: 1.25 mg/dL — ABNORMAL HIGH (ref 0.61–1.24)
GFR calc Af Amer: >60 mL/min (ref >60)
GFR calc non Af Amer: >60 mL/min (ref >60)
Glucose, Bld: 98 mg/dL (ref 70–99)
Potassium: 4.5 mmol/L (ref 3.5–5.1)
Sodium: 140 mmol/L (ref 135–145)

## 2019-11-02 LAB — PROTIME-INR
INR: 2 — ABNORMAL HIGH (ref 0.8–1.2)
Prothrombin Time: 21.9 seconds — ABNORMAL HIGH (ref 11.4–15.2)

## 2019-11-02 LAB — HEPARIN LEVEL (UNFRACTIONATED): Heparin Unfractionated: 0.44 IU/mL (ref 0.30–0.70)

## 2019-11-02 LAB — MAGNESIUM: Magnesium: 2 mg/dL (ref 1.7–2.4)

## 2019-11-02 MED ORDER — WARFARIN SODIUM 2.5 MG PO TABS
7.5000 mg | ORAL_TABLET | Freq: Every day | ORAL | 0 refills | Status: DC
Start: 2019-11-02 — End: 2019-11-27

## 2019-11-02 MED ORDER — LOSARTAN POTASSIUM 25 MG PO TABS: 25.0000 mg | ORAL_TABLET | Freq: Every day | ORAL | 0 refills | Status: DC

## 2019-11-02 MED ORDER — FLOVENT HFA 110 MCG/ACT IN AERO: 2.0000 | INHALATION_SPRAY | Freq: Two times a day (BID) | RESPIRATORY_TRACT | 0 refills | Status: DC

## 2019-11-02 MED ORDER — GABAPENTIN 400 MG PO CAPS: 400.0000 mg | ORAL_CAPSULE | Freq: Every day | ORAL | 0 refills | Status: DC | PRN

## 2019-11-02 MED ORDER — ALBUTEROL SULFATE HFA 108 (90 BASE) MCG/ACT IN AERS
2.0000 | INHALATION_SPRAY | RESPIRATORY_TRACT | 0 refills | Status: DC | PRN
Start: 2019-11-02 — End: 2019-12-04

## 2019-11-02 MED ORDER — OXYCODONE HCL 5 MG PO TABS: 5.0000 mg | ORAL_TABLET | Freq: Three times a day (TID) | ORAL | 0 refills | Status: DC | PRN

## 2019-11-02 MED ORDER — FUROSEMIDE 40 MG PO TABS: 40.0000 mg | ORAL_TABLET | Freq: Every day | ORAL | 0 refills | Status: DC

## 2019-11-02 MED ORDER — CARVEDILOL 3.125 MG PO TABS: 3.1250 mg | ORAL_TABLET | Freq: Two times a day (BID) | ORAL | 0 refills | Status: DC

## 2019-11-02 MED ORDER — PANTOPRAZOLE SODIUM 20 MG PO TBEC: 20.0000 mg | DELAYED_RELEASE_TABLET | Freq: Every day | ORAL | 0 refills | Status: DC

## 2019-11-02 NOTE — Progress Notes (Signed)
Patient discharged home to pick meds up from [facility].

## 2019-11-02 NOTE — Progress Notes (Signed)
Discharge instructions given to patient,PIV removed,CCMD informed tele discontinued. Hard copy RX's given to patient he is also waiting on RX's from [facility]. Patient instructed call for his ride home.

## 2019-11-02 NOTE — Discharge Summary (Signed)
Physician Discharge Summary  Allen Mayer ZOX:[id number] DOB: January 19, 1961 DOA: 10/27/2019  PCP: [facility], [facility]  Admit date: 10/27/2019 Discharge date: 11/02/2019  Admitted From: Home Disposition: Home  Recommendations for Outpatient Follow-up:  1. Follow up with PCP in 1-2 weeks 2. Please obtain BMP/CBC in one week your next doctors visit.  3. Advised to take daily Coumadin 7.5 mg.  He has follow-up appointment for INR check on 7/26 at Coumadin clinic with cardiology, further adjustments to be made at that time 4. Advised to quit using any illicit drugs including cocaine 5. Advised to call his psychiatry clinic to get further psychiatry medication refill.   Discharge Condition: Stable CODE STATUS: Full code Diet recommendation:  heart healthy  Brief/Interim Summary:  Acute respiratory failure with hypoxia Acute on chronic combined systolic and diastolic CHF -Resolved with diuretics.  Started Coreg 3.125 mg twice daily, Lasix 40 mg daily, losartan 25 mg daily, Coumadin 7.5 mg daily. -12/11/2017 echo EF 25-30%, grade 3 DD, mild to moderate decreased RV -10/28/19 Echo EF <20%, G3DD, mod RV dysfunction, +LV thrombus  LV thrombus -Heparin to Coumadin bridge,  INR today is therapeutic 2.0.  Discontinue heparin drip, transition to only p.o. Coumadin.  Discharge with outpatient follow-up recommendations as stated above.  Sinus tachycardia bradyarrhythmia. -Low-dose Coreg has been restarted per cardiology  COPD -As needed bronchodilators  Essential hypertension -Losartan  Polysubstance abuse -Advised to quit using illicit drugs and tobacco.  Chronic pain syndrome -Continue gabapentin.  As needed Norco. Patient apparently is a police report stating that he lost all of his medications.  He is requesting for me to refill narcotics.  I have advised him that he needs to get it from his outpatient provider who typically gives him that.  Until Monday up given  him 4 tablets of oxycodone thereafter he needs to reach out to his PCP.  Body mass index is 25.93 kg/m.         Discharge Diagnoses:  Principal Problem:   Acute exacerbation of CHF (congestive heart failure) (HCC) Active Problems:   Neuropathic pain   Tobacco abuse   Essential hypertension, benign   Elevated troponin   COPD (chronic obstructive pulmonary disease) (HCC)   SOB (shortness of breath)   Acute respiratory failure with hypoxia (HCC)   Acute on chronic combined systolic and diastolic CHF (congestive heart failure) (HCC)   LV (left ventricular) mural thrombus   RUQ pain      Consultations:  Cardiology  Subjective: Feels great no complaints wishes to go home.  Discharge Exam: Vitals:   11/02/19 0802 11/02/19 0826  BP:  (!) 107/61  Pulse:  45  Resp:  20  Temp:  (!) 97.5 F (36.4 C)  SpO2: 98% 98%   Vitals:   11/02/19 0526 11/02/19 0529 11/02/19 0802 11/02/19 0826  BP: (!) 133/106   (!) 107/61  Pulse: 93   45  Resp: 18   20  Temp: 98.2 F (36.8 C)   (!) 97.5 F (36.4 C)  TempSrc: Oral   Oral  SpO2: 97%  98% 98%  Weight:  86.7 kg    Height:        General: Pt is alert, awake, not in acute distress Cardiovascular: RRR, S1/S2 +, no rubs, no gallops Respiratory: CTA bilaterally, no wheezing, no rhonchi Abdominal: Soft, NT, ND, bowel sounds + Extremities: no edema, no cyanosis  Discharge Instructions   Allergies as of 11/02/2019   No Known Allergies     Medication List  STOP taking these medications   amoxicillin-clavulanate 875-125 MG tablet Commonly known as: AUGMENTIN   beclomethasone 80 MCG/ACT inhaler Commonly known as: Qvar   colchicine 0.6 MG tablet   esomeprazole 20 MG packet Commonly known as: NEXIUM   fluticasone 50 MCG/ACT nasal spray Commonly known as: FLONASE   lisinopril-hydrochlorothiazide 20-25 MG tablet Commonly known as: ZESTORETIC   Narcan 4 MG/0.1ML Liqd nasal spray kit Generic drug: naloxone    nitroGLYCERIN 0.4 MG SL tablet Commonly known as: NITROSTAT   oxyCODONE-acetaminophen 10-325 MG tablet Commonly known as: PERCOCET   oxyCODONE-acetaminophen 5-325 MG tablet Commonly known as: PERCOCET/ROXICET   predniSONE 20 MG tablet Commonly known as: DELTASONE   Vitamin D (Ergocalciferol) 1.25 MG (50000 UNIT) Caps capsule Commonly known as: DRISDOL     TAKE these medications   albuterol 108 (90 Base) MCG/ACT inhaler Commonly known as: VENTOLIN HFA Inhale 2 puffs into the lungs every 4 (four) hours as needed for wheezing or shortness of breath.   baclofen 10 MG tablet Commonly known as: LIORESAL Take 10 mg by mouth 3 (three) times daily as needed for muscle spasms. LF 10/09/19 30DS   benztropine 1 MG tablet Commonly known as: COGENTIN Take 1 tablet (1 mg total) by mouth at bedtime.   carvedilol 3.125 MG tablet Commonly known as: COREG Take 1 tablet (3.125 mg total) by mouth 2 (two) times daily with a meal.   citalopram 20 MG tablet Commonly known as: CELEXA Take 1 tablet (20 mg total) by mouth daily.   Flovent HFA 110 MCG/ACT inhaler Generic drug: fluticasone Inhale 2 puffs into the lungs 2 (two) times daily.   furosemide 40 MG tablet Commonly known as: LASIX Take 1 tablet (40 mg total) by mouth daily. Start taking on: November 03, 2019   gabapentin 400 MG capsule Commonly known as: NEURONTIN Take 1 capsule (400 mg total) by mouth daily as needed (nerve pain).   losartan 25 MG tablet Commonly known as: COZAAR Take 1 tablet (25 mg total) by mouth daily. Start taking on: November 03, 2019   oxyCODONE 5 MG immediate release tablet Commonly known as: Oxy IR/ROXICODONE Take 1 tablet (5 mg total) by mouth 3 (three) times daily as needed (pain).   paliperidone 6 MG 24 hr tablet Commonly known as: INVEGA Take 1 tablet (6 mg total) by mouth daily.   pantoprazole 20 MG tablet Commonly known as: PROTONIX Take 1 tablet (20 mg total) by mouth daily before  breakfast. What changed: when to take this   warfarin 2.5 MG tablet Commonly known as: Coumadin Take 3 tablets (7.5 mg total) by mouth daily.       Follow-up Information    [place] Office Follow up on 11/04/2019.   Specialty: Cardiology Why: Please arrive 15 minutes early for your 10:30am coumadin clinic appointment at our [facility] location. You will have your INR level checked and will receive advice on how to take your coumadin going forward Contact information: [address] [place] [phone]       Minus Breeding, MD Follow up on 11/14/2019.   Specialty: Cardiology Why: Please arrive 15 minutes early for your 8:40am post-hospital cardiology follow-up appointment Contact information: [address] [place] [address] [phone]        [facility], [facility]. Schedule an appointment as soon as possible for a visit in 1 week(s).   Specialty: Family Medicine Contact information: [place]  [address] [phone]              No Known Allergies  You were cared for by a hospitalist during your hospital stay. If you have any questions about your discharge medications or the care you received while you were in the hospital after you are discharged, you can call the unit and asked to speak with the hospitalist on call if the hospitalist that took care of you is not available. Once you are discharged, your primary care physician will handle any further medical issues. Please note that no refills for any discharge medications will be authorized once you are discharged, as it is imperative that you return to your primary care physician (or establish a relationship with a primary care physician if you do not have one) for your aftercare needs so that they can reassess your need for medications and monitor your lab values.   Procedures/Studies: DG Chest 2  View  Result Date: 10/27/2019 CLINICAL DATA:  Patient complains of SOB with chest pain and sometime he feels lightheaded, patient also states he feels pain in the left side of his neck. Current smoker. EXAM: CHEST - 2 VIEW COMPARISON:  Chest radiograph 09/13/2019 FINDINGS: Stable cardiomediastinal contours with enlarged heart size. Central venous congestion. There are mild diffuse bilateral interstitial opacities similar to prior. No new focal consolidation. No pneumothorax or pleural effusion. No acute finding in the visualized skeleton. IMPRESSION: Cardiomegaly with central vascular congestion and mild bilateral interstitial opacities, likely trace edema, similar to prior. Electronically Signed   By: Audie Pinto M.D.   On: 10/27/2019 17:32   ECHOCARDIOGRAM COMPLETE  Result Date: 10/28/2019    ECHOCARDIOGRAM REPORT   Patient Name:   Allen Mayer Date of Exam: 10/28/2019 Medical Rec #:  [id number]        Height:       72.0 in Accession #:    [id number]       Weight:       210.0 lb Date of Birth:  Apr 03, 1961       BSA:          2.175 m Patient Age:    15 years         BP:           124/75 mmHg Patient Gender: M                HR:           97 bpm. Exam Location:  Inpatient Procedure: 2D Echo, Cardiac Doppler and Color Doppler REPORT CONTAINS CRITICAL RESULT Indications:    K08.81 Acute systolic (congestive) heart failure  History:        Patient has prior history of Echocardiogram examinations, most                 recent 12/11/2017. Cardiomyopathy and CHF, Abnormal ECG, COPD,                 Signs/Symptoms:Dyspnea, Shortness of Breath and Chest Pain; Risk                 Factors:Current Smoker and Hypertension. Elevated troponin.                 Hypoxia. Edema. Dilated cardiomyopathy.  Sonographer:    Roseanna Rainbow RDCS Referring Phys: [id number] [place] FAIR  Sonographer Comments: No IV access, so Definity not done. IMPRESSIONS  1. There appears to be likely chronic LV thrombus at the apex. Patient without  IV access, so  contrast not given. . Left ventricular ejection fraction, by estimation, is <20%. The left ventricle has severely decreased function. The left ventricle demonstrates global hypokinesis. The left ventricular internal cavity size was severely dilated. Left ventricular diastolic parameters are consistent with Grade III diastolic dysfunction (restrictive). Elevated left ventricular end-diastolic pressure.  2. Right ventricular systolic function is moderately reduced. The right ventricular size is moderately enlarged. There is moderately elevated pulmonary artery systolic pressure.  3. Left atrial size was mild to moderately dilated.  4. Right atrial size was moderately dilated.  5. Small circumferential pericardial effusion, bordering on moderate size posterior to RV. No echo evidence of tamponade.. The pericardial effusion is circumferential. There is no evidence of cardiac tamponade.  6. The mitral valve is normal in structure. Mild to moderate mitral valve regurgitation. No evidence of mitral stenosis.  7. Tricuspid valve regurgitation is moderate.  8. The aortic valve is tricuspid. Aortic valve regurgitation is not visualized.  9. The inferior vena cava is dilated in size with <50% respiratory variability, suggesting right atrial pressure of 15 mmHg. Conclusion(s)/Recommendation(s): Severely reduced LVEF, grade 3 diastolic dysfunction with nearly nonexistent A wave and elevated LVEDP. Small to moderate circumferential pericardial effusion without echo tamponade. Chronic appearing apical LV thrombus. FINDINGS  Left Ventricle: There appears to be likely chronic LV thrombus at the apex. Patient without IV access, so contrast not given. Left ventricular ejection fraction, by estimation, is <20%. The left ventricle has severely decreased function. The left ventricle demonstrates global hypokinesis. The left ventricular internal cavity size was severely dilated. There is borderline left ventricular  hypertrophy. Left ventricular diastolic parameters are consistent with Grade III diastolic dysfunction (restrictive). Elevated left ventricular end-diastolic pressure. Right Ventricle: The right ventricular size is moderately enlarged. Right vetricular wall thickness was not assessed. Right ventricular systolic function is moderately reduced. There is moderately elevated pulmonary artery systolic pressure. The tricuspid regurgitant velocity is 3.02 m/s, and with an assumed right atrial pressure of 15 mmHg, the estimated right ventricular systolic pressure is 67.2 mmHg. Left Atrium: Left atrial size was mild to moderately dilated. Right Atrium: Right atrial size was moderately dilated. Pericardium: Small circumferential pericardial effusion, bordering on moderate size posterior to RV. No echo evidence of tamponade. A small pericardial effusion is present. The pericardial effusion is circumferential. There is no evidence of cardiac tamponade. Mitral Valve: The mitral valve is normal in structure. Mild to moderate mitral valve regurgitation. No evidence of mitral valve stenosis. Tricuspid Valve: The tricuspid valve is normal in structure. Tricuspid valve regurgitation is moderate . No evidence of tricuspid stenosis. Aortic Valve: The aortic valve is tricuspid. Aortic valve regurgitation is not visualized. There is mild calcification of the aortic valve. Pulmonic Valve: The pulmonic valve was grossly normal. Pulmonic valve regurgitation is trivial. No evidence of pulmonic stenosis. Aorta: The aortic root and ascending aorta are structurally normal, with no evidence of dilitation. Venous: The inferior vena cava is dilated in size with less than 50% respiratory variability, suggesting right atrial pressure of 15 mmHg. IAS/Shunts: The atrial septum is grossly normal.  LEFT VENTRICLE PLAX 2D LVIDd:         6.80 cm      Diastology LVIDs:         6.40 cm      LV e' lateral:   6.18 cm/s LV PW:         1.10 cm      LV E/e'  lateral: 17.5 LV IVS:  1.10 cm      LV e' medial:    2.75 cm/s LVOT diam:     2.00 cm      LV E/e' medial:  39.3 LV SV:         33 LV SV Index:   15 LVOT Area:     3.14 cm  LV Volumes (MOD) LV vol d, MOD A2C: 231.0 ml LV vol d, MOD A4C: 174.0 ml LV vol s, MOD A2C: 193.0 ml LV vol s, MOD A4C: 135.0 ml LV SV MOD A2C:     38.0 ml LV SV MOD A4C:     174.0 ml LV SV MOD BP:      39.6 ml RIGHT VENTRICLE            IVC RV S prime:     6.53 cm/s  IVC diam: 2.10 cm TAPSE (M-mode): 1.3 cm LEFT ATRIUM           Index       RIGHT ATRIUM           Index LA diam:      4.60 cm 2.11 cm/m  RA Area:     26.60 cm LA Vol (A2C): 57.1 ml 26.25 ml/m RA Volume:   106.00 ml 48.73 ml/m LA Vol (A4C): 61.3 ml 28.18 ml/m  AORTIC VALVE LVOT Vmax:   78.30 cm/s LVOT Vmean:  55.400 cm/s LVOT VTI:    0.105 m  AORTA Ao Root diam: 3.10 cm Ao Asc diam:  3.00 cm MITRAL VALVE                TRICUSPID VALVE MV Area (PHT): 6.71 cm     TR Peak grad:   36.5 mmHg MV Decel Time: 113 msec     TR Vmax:        302.00 cm/s MV E velocity: 108.00 cm/s                             SHUNTS                             Systemic VTI:  0.10 m                             Systemic Diam: 2.00 cm Buford Dresser MD Electronically signed by Buford Dresser MD Signature Date/Time: 10/28/2019/11:01:37 AM    Final    US Abdomen Limited RUQ  Result Date: 10/31/2019 CLINICAL DATA:  Right upper quadrant pain. EXAM: ULTRASOUND ABDOMEN LIMITED RIGHT UPPER QUADRANT COMPARISON:  None. FINDINGS: Gallbladder: No gallstones or wall thickening visualized in (3.9 mm). No sonographic Murphy sign noted by sonographer. Common bile duct: Diameter: 4.4 mm Liver: No focal lesion identified. Within normal limits in parenchymal echogenicity. Portal vein is patent on color Doppler imaging with normal direction of blood flow towards the liver. Other: None. IMPRESSION: Normal right upper quadrant ultrasound. Electronically Signed   By: Virgina Norfolk M.D.   On: 10/31/2019  19:42      The results of significant diagnostics from this hospitalization (including imaging, microbiology, ancillary and laboratory) are listed below for reference.     Microbiology: Recent Results (from the past 240 hour(s))  SARS Coronavirus 2 by RT PCR (hospital order, performed in [facility] hospital lab) Nasopharyngeal Nasopharyngeal Swab     Status: None   Collection Time: 10/27/19  10:49 PM   Specimen: Nasopharyngeal Swab  Result Value Ref Range Status   SARS Coronavirus 2 NEGATIVE NEGATIVE Final    Comment: (NOTE) SARS-CoV-2 target nucleic acids are NOT DETECTED.  The SARS-CoV-2 RNA is generally detectable in upper and lower respiratory specimens during the acute phase of infection. The lowest concentration of SARS-CoV-2 viral copies this assay can detect is 250 copies / mL. A negative result does not preclude SARS-CoV-2 infection and should not be used as the sole basis for treatment or other patient management decisions.  A negative result may occur with improper specimen collection / handling, submission of specimen other than nasopharyngeal swab, presence of viral mutation(s) within the areas targeted by this assay, and inadequate number of viral copies (<250 copies / mL). A negative result must be combined with clinical observations, patient history, and epidemiological information.  Fact Sheet for Patients:   StrictlyIdeas.no  Fact Sheet for Healthcare Providers: BankingDealers.co.za  This test is not yet approved or  cleared by the [place] FDA and has been authorized for detection and/or diagnosis of SARS-CoV-2 by FDA under an Emergency Use Authorization (EUA).  This EUA will remain in effect (meaning this test can be used) for the duration of the COVID-19 declaration under Section 564(b)(1) of the Act, 21 U.S.C. section 360bbb-3(b)(1), unless the authorization is terminated or revoked sooner.  Performed  at [facility], [place] [address], [place], [place] [address]      Labs: BNP (last 3 results) Recent Labs    09/13/19 2330 10/27/19 2244  BNP 1,585.9* 7,253.6*   Basic Metabolic Panel: Recent Labs  Lab 10/29/19 0635 10/30/19 0730 10/31/19 0501 11/01/19 0814 11/02/19 0723  NA 141 140 138 142 140  K 4.1 3.9 4.4 4.4 4.5  CL 104 103 102 107 102  CO2 '28 28 28 26 28  ' GLUCOSE 99 116* 106* 110* 98  BUN '19 13 14 12 13  ' CREATININE 1.33* 1.22 1.35* 1.27* 1.25*  CALCIUM 8.7* 8.6* 8.9 9.2 9.2  MG 2.0 1.9 2.1 2.0 2.0  PHOS 4.2  --   --   --   --    Liver Function Tests: Recent Labs  Lab 10/29/19 0635 10/30/19 0730 10/31/19 0501 11/01/19 0814  AST 38 47* 39 49*  ALT 54* 69* 64* 70*  ALKPHOS 48 55 57 70  BILITOT 1.8* 1.1 1.3* 1.0  PROT 5.3* 5.0* 5.7* 6.0*  ALBUMIN 2.7* 2.8* 3.1* 3.2*   No results for input(s): LIPASE, AMYLASE in the last 168 hours. No results for input(s): AMMONIA in the last 168 hours. CBC: Recent Labs  Lab 10/29/19 0635 10/30/19 0730 10/31/19 0501 11/01/19 0814 11/02/19 0723  WBC 5.4 4.2 4.0 4.5 4.2  HGB 14.5 13.5 14.5 14.8 14.0  HCT 45.3 42.6 45.8 47.2 45.0  MCV 91.0 90.1 90.5 91.3 90.7  PLT 180 153 172 154 160   Cardiac Enzymes: No results for input(s): CKTOTAL, CKMB, CKMBINDEX, TROPONINI in the last 168 hours. BNP: Invalid input(s): POCBNP CBG: No results for input(s): GLUCAP in the last 168 hours. D-Dimer No results for input(s): DDIMER in the last 72 hours. Hgb A1c No results for input(s): HGBA1C in the last 72 hours. Lipid Profile No results for input(s): CHOL, HDL, LDLCALC, TRIG, CHOLHDL, LDLDIRECT in the last 72 hours. Thyroid function studies No results for input(s): TSH, T4TOTAL, T3FREE, THYROIDAB in the last 72 hours.  Invalid input(s): FREET3 Anemia work up No results for input(s): VITAMINB12, FOLATE, FERRITIN, TIBC, IRON, RETICCTPCT in the  last 72 hours. Urinalysis    Component Value Date/Time   COLORURINE YELLOW  09/13/2019 2335   APPEARANCEUR CLEAR 09/13/2019 2335   LABSPEC 1.013 09/13/2019 2335   PHURINE 5.0 09/13/2019 2335   GLUCOSEU NEGATIVE 09/13/2019 2335   HGBUR NEGATIVE 09/13/2019 2335   [place] NEGATIVE 09/13/2019 2335   KETONESUR NEGATIVE 09/13/2019 2335   PROTEINUR NEGATIVE 09/13/2019 2335   UROBILINOGEN 1.0 01/31/2012 0445   NITRITE NEGATIVE 09/13/2019 2335   LEUKOCYTESUR TRACE (A) 09/13/2019 2335   Sepsis Labs Invalid input(s): PROCALCITONIN,  WBC,  LACTICIDVEN Microbiology Recent Results (from the past 240 hour(s))  SARS Coronavirus 2 by RT PCR (hospital order, performed in [place] hospital lab) Nasopharyngeal Nasopharyngeal Swab     Status: None   Collection Time: 10/27/19 10:49 PM   Specimen: Nasopharyngeal Swab  Result Value Ref Range Status   SARS Coronavirus 2 NEGATIVE NEGATIVE Final    Comment: (NOTE) SARS-CoV-2 target nucleic acids are NOT DETECTED.  The SARS-CoV-2 RNA is generally detectable in upper and lower respiratory specimens during the acute phase of infection. The lowest concentration of SARS-CoV-2 viral copies this assay can detect is 250 copies / mL. A negative result does not preclude SARS-CoV-2 infection and should not be used as the sole basis for treatment or other patient management decisions.  A negative result may occur with improper specimen collection / handling, submission of specimen other than nasopharyngeal swab, presence of viral mutation(s) within the areas targeted by this assay, and inadequate number of viral copies (<250 copies / mL). A negative result must be combined with clinical observations, patient history, and epidemiological information.  Fact Sheet for Patients:   StrictlyIdeas.no  Fact Sheet for Healthcare Providers: BankingDealers.co.za  This test is not yet approved or  cleared by the [place] FDA and has been authorized for detection and/or diagnosis of  SARS-CoV-2 by FDA under an Emergency Use Authorization (EUA).  This EUA will remain in effect (meaning this test can be used) for the duration of the COVID-19 declaration under Section 564(b)(1) of the Act, 21 U.S.C. section 360bbb-3(b)(1), unless the authorization is terminated or revoked sooner.  Performed at [facility], [place] [address] [place], [place] [address]      Time coordinating discharge:  I have spent 35 minutes face to face with the patient and on the ward discussing the patients care, assessment, plan and disposition with other care givers. >50% of the time was devoted counseling the patient about the risks and benefits of treatment/Discharge disposition and coordinating care.   SIGNED:   Damita Lack, MD  [facility] 11/02/2019, 11:33 AM   If 7PM-7AM, please contact night-coverage

## 2019-11-02 NOTE — Progress Notes (Signed)
ANTICOAGULATION CONSULT NOTE   Pharmacy Consult for Heparin/Warfarin Indication: LV thrombus  No Known Allergies  Patient Measurements: Height: 6' (182.9 cm) Weight: 86.7 kg (191 lb 3.2 oz) IBW/kg (Calculated) : 77.6 Heparin Dosing Weight: TBW  Vital Signs: Temp: 98.2 F (36.8 C) (07/24 0526) Temp Source: Oral (07/24 0526) BP: 133/106 (07/24 0526) Pulse Rate: 93 (07/24 0526)  Labs: Recent Labs    10/31/19 0501 11/01/19 0814  HGB 14.5 14.8  HCT 45.8 47.2  PLT 172 154  LABPROT 14.7 17.0*  INR 1.2 1.4*  HEPARINUNFRC 0.52 0.40  CREATININE 1.35* 1.27*    Estimated Creatinine Clearance: 67.9 mL/min (A) (by C-G formula based on SCr of 1.27 mg/dL (H)).  Assessment: 59 yo male presented with chest pain and an LV thrombus found. Patient is to start warfarin with a heparin drip bridge. Patient was not on anticoagulation prior to admission.    Heparin level this morning is therapeutic (HL 0.44 << 0.4, goal of 0.3-0.7). INR today is therapeutic after a higher dose of 10 mg requested by Dr. Nelson Chimes yesterday (INR 2 << 1.4, goal of 2-3). CBC stable. No bleeding reported per RN.   Noted plans for discharge home today. Cards okayed d/c if/when INR>/= 1.8. Upon discharge - would recommend 5 mg x 1, then 7.5 mg daily with a follow-up appointment for INR re-check on Mon, 7/26.   Goal of Therapy:  INR 2-3 Heparin level 0.3-0.7 units/ml Monitor platelets by anticoagulation protocol: Yes   Plan:  - D/c Heparin - Warfarin 5 mg x 1 dose at 1600 today (if still here) - Upon discharge would recommend 5 mg x 1 today followed by 7.5 mg/day with follow-up appointment on Monday, 7/26.   Thank you for allowing pharmacy to be a part of this patient's care.  Georgina Pillion, PharmD, BCPS Clinical Pharmacist Clinical phone for 11/02/2019: [phone] 11/02/2019 8:12 AM   **Pharmacist phone directory can now be found on [email or web address] ([email or web address]).  Listed under [facility].

## 2019-11-12 NOTE — Progress Notes (Signed)
Cardiology Office Note   Date:  11/14/2019   ID:  [name], DOB 27-Sep-1960, MRN [id number]  PCP:  [facility]  Cardiologist:   Rollene Rotunda, MD   Chief Complaint  Patient presents with  . Cardiomyopathy      History of Present Illness: Allen Mayer is a 59 y.o. male who presents for follow up of acute systolic HF and LV thrombus.  He was in the hospital with this last month.   He has been a no show for coumadin follow up.  Of note he had prior cardiac catheterization in 2019 that demonstrated no coronary artery disease.  He has a nonischemic cardiomyopathy.  He did have runs of nonsustained ventricular tachycardia in the hospital.  Of note he has a history of cocaine use with previous positive tox screen recently.  We are cautiously using carvedilol rather than metoprolol.  Overall I think he is doing relatively well.  He is not short of breath as he was when he came to the hospital although he still does have some dyspnea with exertion.  He says he has had some mild nosebleeds.  He has not noticed any GI bleeding.  I think he has been compliant with his cardiac medications.  He has a cough productive of some white phlegm occasionally but has not had any fevers or chills.  He has had no new PND or orthopnea.  He said no new lower extremity swelling.   Past Medical History:  Diagnosis Date  . Acid reflux   . Arthritis   . Asthma   . Back pain   . Bronchitis   . COPD (chronic obstructive pulmonary disease) (HCC)   . Hypertension   . Irregular heart beat   . LV (left ventricular) mural thrombus   . Myocardial infarction (HCC)   . Neuropathic pain   . NICM (nonischemic cardiomyopathy) (HCC) 2019  . RVF (right ventricular failure) (HCC)   . Schizo affective schizophrenia [facility])     Past Surgical History:  Procedure Laterality Date  . None    . RIGHT/LEFT HEART CATH AND CORONARY ANGIOGRAPHY N/A 12/13/2017   Procedure: RIGHT/LEFT HEART CATH  AND CORONARY ANGIOGRAPHY;  Surgeon: Runell Gess, MD;  Location: [facility] INVASIVE CV LAB;  Service: Cardiovascular;  Laterality: N/A;     Current Outpatient Medications  Medication Sig Dispense Refill  . albuterol (VENTOLIN HFA) 108 (90 Base) MCG/ACT inhaler Inhale 2 puffs into the lungs every 4 (four) hours as needed for wheezing or shortness of breath. 18 g 0  . baclofen (LIORESAL) 10 MG tablet Take 10 mg by mouth 3 (three) times daily as needed for muscle spasms. LF 10/09/19 30DS    . benztropine (COGENTIN) 1 MG tablet Take 1 tablet (1 mg total) by mouth at bedtime. 30 tablet 0  . citalopram (CELEXA) 20 MG tablet Take 1 tablet (20 mg total) by mouth daily. 30 tablet 0  . FLOVENT HFA 110 MCG/ACT inhaler Inhale 2 puffs into the lungs 2 (two) times daily. 1 Inhaler 0  . furosemide (LASIX) 40 MG tablet Take 1 tablet (40 mg total) by mouth daily. 30 tablet 0  . gabapentin (NEURONTIN) 400 MG capsule Take 1 capsule (400 mg total) by mouth daily as needed (nerve pain). 30 capsule 0  . oxyCODONE (OXY IR/ROXICODONE) 5 MG immediate release tablet Take 1 tablet (5 mg total) by mouth 3 (three) times daily as needed (pain). (Patient taking differently: Take 10 mg by mouth 3 (  three) times daily as needed (pain). ) 4 tablet 0  . paliperidone (INVEGA) 6 MG 24 hr tablet Take 1 tablet (6 mg total) by mouth daily. 30 tablet 0  . pantoprazole (PROTONIX) 20 MG tablet Take 1 tablet (20 mg total) by mouth daily before breakfast. 30 tablet 0  . warfarin (COUMADIN) 2.5 MG tablet Take 3 tablets (7.5 mg total) by mouth daily. 90 tablet 0  . carvedilol (COREG) 6.25 MG tablet Take 1 tablet (6.25 mg total) by mouth 2 (two) times daily. 180 tablet 3  . losartan (COZAAR) 50 MG tablet Take 1 tablet (50 mg total) by mouth daily. 90 tablet 3   No current facility-administered medications for this visit.    Allergies:   Patient has no known allergies.    ROS:  Please see the history of present illness.   Otherwise, review of  systems are positive for none.   All other systems are reviewed and negative.    PHYSICAL EXAM: VS:  BP 120/74   Pulse 83   Temp (!) 97.2 F (36.2 C)   Ht 6\' 1"  (1.854 m)   Wt 201 lb 12.8 oz (91.5 kg)   SpO2 97%   BMI 26.62 kg/m  , BMI Body mass index is 26.62 kg/m. GENERAL:  Well appearing NECK:  No jugular venous distention, waveform within normal limits, carotid upstroke brisk and symmetric, no bruits, no thyromegaly LUNGS:  Clear to auscultation bilaterally CHEST:  Unremarkable HEART:  PMI not displaced or sustained,S1 and S2 within normal limits, no S3, no S4, no clicks, no rubs, no murmurs ABD:  Flat, positive bowel sounds normal in frequency in pitch, no bruits, no rebound, no guarding, no midline pulsatile mass, no hepatomegaly, no splenomegaly EXT:  2 plus pulses throughout, no edema, no cyanosis no clubbing   EKG:  EKG is not ordered today.    Recent Labs: 10/27/2019: B Natriuretic Peptide 1,914.6 11/01/2019: ALT 70 11/02/2019: BUN 13; Creatinine, Ser 1.25; Hemoglobin 14.0; Magnesium 2.0; Platelets 160; Potassium 4.5; Sodium 140    Lipid Panel    Component Value Date/Time   CHOL 126 10/28/2019 0712   TRIG 57 10/28/2019 0712   HDL 32 (L) 10/28/2019 0712   CHOLHDL 3.9 10/28/2019 0712   VLDL 11 10/28/2019 0712   LDLCALC 83 10/28/2019 0712      Wt Readings from Last 3 Encounters:  11/14/19 201 lb 12.8 oz (91.5 kg)  11/02/19 191 lb 3.2 oz (86.7 kg)  09/13/19 206 lb 9.1 oz (93.7 kg)      Other studies Reviewed: Additional studies/ records that were reviewed today include: Hospital records. Review of the above records demonstrates:  Please see elsewhere in the note.     ASSESSMENT AND PLAN:   Acute on chronic combined systolic and diastolic CHF He does have a cough that does not seem to be overtly volume overloaded.  I am going to check a BNP level.  I am going to titrate his medications by increasing his carvedilol to 6.25 mg twice a day and increase his  Cozaar to 50 mg daily.   LV thrombus I discussed with him LV thrombus and is getting his Coumadin checked today.  He will continue with anticoagulation probably for about 3 months with a follow-up echo.  tient follow-up recommendations as stated above.  Sinus tachycardia bradyarrhythmia. I think he will tolerate the change in medications and will watch his heart rate.   COPD He can continue with as needed bronchodilators.   Essential  hypertension This is being managed in the context of treating his CHF  Polysubstance abuse He says he is not using drugs.  We talked about cigarettes as below.  Tobacco abuse.   He has been educated about the need to stop smoking completely.  Chronic pain syndrome Per his primary team.   Current medicines are reviewed at length with the patient today.  The patient does not have concerns regarding medicines.  The following changes have been made:  no change  Labs/ tests ordered today include:   Orders Placed This Encounter  Procedures  . Basic metabolic panel  . B Nat Peptide  . CBC w/Diff/Platelet     Disposition:   FU with APP in two weeks.     Signed, Rollene Rotunda, MD  11/14/2019 9:46 AM    [facility]

## 2019-11-14 ENCOUNTER — Ambulatory Visit (INDEPENDENT_AMBULATORY_CARE_PROVIDER_SITE_OTHER): Payer: Medicaid Other | Admitting: Cardiology

## 2019-11-14 ENCOUNTER — Other Ambulatory Visit: Payer: Self-pay

## 2019-11-14 ENCOUNTER — Ambulatory Visit (INDEPENDENT_AMBULATORY_CARE_PROVIDER_SITE_OTHER): Payer: Self-pay | Admitting: Pharmacist

## 2019-11-14 ENCOUNTER — Encounter: Payer: Self-pay | Admitting: Cardiology

## 2019-11-14 VITALS — BP 120/74 | HR 83 | Temp 97.2°F | Ht 73.0 in | Wt 201.8 lb

## 2019-11-14 DIAGNOSIS — R0602 Shortness of breath: Secondary | ICD-10-CM | POA: Diagnosis not present

## 2019-11-14 DIAGNOSIS — I5043 Acute on chronic combined systolic (congestive) and diastolic (congestive) heart failure: Secondary | ICD-10-CM

## 2019-11-14 DIAGNOSIS — I25119 Atherosclerotic heart disease of native coronary artery with unspecified angina pectoris: Secondary | ICD-10-CM

## 2019-11-14 DIAGNOSIS — I420 Dilated cardiomyopathy: Secondary | ICD-10-CM

## 2019-11-14 DIAGNOSIS — I513 Intracardiac thrombosis, not elsewhere classified: Secondary | ICD-10-CM

## 2019-11-14 DIAGNOSIS — Z7901 Long term (current) use of anticoagulants: Secondary | ICD-10-CM

## 2019-11-14 LAB — POCT INR: INR: 1.1 — AB (ref 2.0–3.0)

## 2019-11-14 MED ORDER — LOSARTAN POTASSIUM 50 MG PO TABS
50.0000 mg | ORAL_TABLET | Freq: Every day | ORAL | 3 refills | Status: DC
Start: 2019-11-14 — End: 2019-12-04

## 2019-11-14 MED ORDER — CARVEDILOL 6.25 MG PO TABS
6.2500 mg | ORAL_TABLET | Freq: Two times a day (BID) | ORAL | 3 refills | Status: DC
Start: 2019-11-14 — End: 2019-12-04

## 2019-11-14 NOTE — Patient Instructions (Addendum)
Medication Instructions:  Increase Carvedilol to 6.25 mg twice a day Increase Cozaar to 50 mg daily Continue all other medications *If you need a refill on your cardiac medications before your next appointment, please call your pharmacy*  Take warfarin  5 tablets (all at the same time) Thursday August 5 and Friday August 6.  Then take 4 tablets daily Saturday and Sunday.  Repeat INR Monday August 9   Lab Work: INR today Bmet,bnp,cbc today If you have labs (blood work) drawn today and your tests are completely normal, you will receive your results only by: Marland Kitchen MyChart Message (if you have MyChart) OR . A paper copy in the mail If you have any lab test that is abnormal or we need to change your treatment, we will call you to review the results.   Testing/Procedures: None ordered   Follow-Up: At [facility], you and your health needs are our priority.  As part of our continuing mission to provide you with exceptional heart care, we have created designated Provider Care Teams.  These Care Teams include your primary Cardiologist (physician) and Advanced Practice Providers (APPs -  Physician Assistants and Nurse Practitioners) who all work together to provide you with the care you need, when you need it.  We recommend signing up for the patient portal called "MyChart".  Sign up information is provided on this After Visit Summary.  MyChart is used to connect with patients for Virtual Visits (Telemedicine).  Patients are able to view lab/test results, encounter notes, upcoming appointments, etc.  Non-urgent messages can be sent to your provider as well.   To learn more about what you can do with MyChart, go to [email or web address].    Your next appointment:  4 weeks   The format for your next appointment: Office   Provider: Extender

## 2019-11-14 NOTE — Patient Instructions (Signed)
Take 5 tablets (all at the same time) Thursday August 5 and Friday August 6.  Then take 4 tablets daily Saturday and Sunday.  Repeat INR Monday August 9

## 2019-11-15 LAB — SPECIMEN STATUS REPORT

## 2019-11-16 LAB — CBC WITH DIFFERENTIAL/PLATELET
Basophils Absolute: 0.1 10*3/uL (ref 0.0–0.2)
Basos: 1 %
EOS (ABSOLUTE): 0.2 10*3/uL (ref 0.0–0.4)
Eos: 3 %
Hematocrit: 42.1 % (ref 37.5–51.0)
Hemoglobin: 14 g/dL (ref 13.0–17.7)
Immature Grans (Abs): 0 10*3/uL (ref 0.0–0.1)
Immature Granulocytes: 0 %
Lymphocytes Absolute: 1.7 10*3/uL (ref 0.7–3.1)
Lymphs: 28 %
MCH: 28.1 pg (ref 26.6–33.0)
MCHC: 33.3 g/dL (ref 31.5–35.7)
MCV: 85 fL (ref 79–97)
Monocytes Absolute: 0.6 10*3/uL (ref 0.1–0.9)
Monocytes: 10 %
Neutrophils Absolute: 3.5 10*3/uL (ref 1.4–7.0)
Neutrophils: 58 %
Platelets: 181 10*3/uL (ref 150–450)
RBC: 4.98 x10E6/uL (ref 4.14–5.80)
RDW: 13 % (ref 11.6–15.4)
WBC: 6 10*3/uL (ref 3.4–10.8)

## 2019-11-16 LAB — BASIC METABOLIC PANEL
BUN/Creatinine Ratio: 15 (ref 10–24)
BUN: 18 mg/dL (ref 8–27)
CO2: 22 mmol/L (ref 20–29)
Calcium: 9.1 mg/dL (ref 8.6–10.2)
Chloride: 104 mmol/L (ref 96–106)
Creatinine, Ser: 1.2 mg/dL (ref 0.76–1.27)
GFR calc Af Amer: 76 mL/min/{1.73_m2} (ref >59)
GFR calc non Af Amer: 65 mL/min/{1.73_m2} (ref >59)
Glucose: 92 mg/dL (ref 65–99)
Potassium: 4.8 mmol/L (ref 3.5–5.2)
Sodium: 141 mmol/L (ref 134–144)

## 2019-11-16 LAB — BRAIN NATRIURETIC PEPTIDE: BNP: 2188.8 pg/mL — ABNORMAL HIGH (ref 0.0–100.0)

## 2019-11-18 ENCOUNTER — Other Ambulatory Visit: Payer: Self-pay

## 2019-11-18 ENCOUNTER — Ambulatory Visit (INDEPENDENT_AMBULATORY_CARE_PROVIDER_SITE_OTHER): Payer: Medicaid Other

## 2019-11-18 DIAGNOSIS — Z7901 Long term (current) use of anticoagulants: Secondary | ICD-10-CM

## 2019-11-18 DIAGNOSIS — I513 Intracardiac thrombosis, not elsewhere classified: Secondary | ICD-10-CM | POA: Diagnosis not present

## 2019-11-18 DIAGNOSIS — Z5181 Encounter for therapeutic drug level monitoring: Secondary | ICD-10-CM

## 2019-11-18 LAB — POCT INR: INR: 3.8 — AB (ref 2.0–3.0)

## 2019-11-18 NOTE — Patient Instructions (Signed)
Hold today and then continue 3 tablets daily at night.  Repeat INR Monday August 16

## 2019-11-25 ENCOUNTER — Other Ambulatory Visit: Payer: Self-pay

## 2019-11-25 ENCOUNTER — Ambulatory Visit (INDEPENDENT_AMBULATORY_CARE_PROVIDER_SITE_OTHER): Payer: Medicaid Other

## 2019-11-25 DIAGNOSIS — Z7901 Long term (current) use of anticoagulants: Secondary | ICD-10-CM

## 2019-11-25 DIAGNOSIS — I513 Intracardiac thrombosis, not elsewhere classified: Secondary | ICD-10-CM | POA: Diagnosis not present

## 2019-11-25 DIAGNOSIS — Z5181 Encounter for therapeutic drug level monitoring: Secondary | ICD-10-CM | POA: Diagnosis not present

## 2019-11-25 LAB — POCT INR: INR: 1 — AB (ref 2.0–3.0)

## 2019-11-25 NOTE — Patient Instructions (Signed)
Take 4 tablets today and 4 tablets tomorrow and then continue 3 tablets daily at night.  Repeat INR Monday August 25

## 2019-11-26 ENCOUNTER — Other Ambulatory Visit: Payer: Self-pay | Admitting: Cardiology

## 2019-11-26 ENCOUNTER — Telehealth (HOSPITAL_COMMUNITY): Payer: Self-pay

## 2019-11-26 NOTE — Telephone Encounter (Signed)
*  STAT* If patient is at the pharmacy, call can be transferred to refill team.   1. Which medications need to be refilled? (please list name of each medication and dose if known) carvedilol (COREG) 6.25 MG tablet / furosemide (LASIX) 40 MG tablet / losartan (COZAAR) 50 MG tablet / pantoprazole (PROTONIX) 20 MG tablet / warfarin (COUMADIN) 2.5 MG tablet   2. Which pharmacy/location (including street and city if local pharmacy) is medication to be sent to? [facility] - [place], [place] - [address]  3. Do they need a 30 day or 90 day supply? 90

## 2019-11-26 NOTE — Telephone Encounter (Signed)
Heart Failure Stewardship Pharmacist  Transitions of Care Follow-up Call  PCP: [facility], [facility] PCP-Cardiologist: Rollene Rotunda, MD    HPI and Hospital Course:  59 y.o.malewith PMH significant for COPD, HTN, GERD andsystolic and diastolicCHF,polysubstance abuse including tobacco, alcohol, cocaine, chronic pain syndrome presenting to [facility]19 with 1 week history of shortness of breath and intermittent chest discomfort. His ECHO on 7/19 showed LVEF <20% and chronic LV thrombus at the apex. He was diuresed and was discharged on 11/02/19. He saw Dr. Antoine Poche in clinic on 11/14/19 and his carvedilol was increased to 6.25 mg BID and his losartan was increased to 50 mg daily.  **Patient was unable to be reached after 3 call attempts**   Pertinent Lab Values: . Serum creatinine 1.2, BUN 18, Potassium 4.8, Sodium 141, BNP 2188, Magnesium 2   HF Medications: Furosemide 40 mg daily Carvedilol 6.25 mg BID Losartan 50 mg daily   Medication Assistance / Insurance Benefits Check: Does the patient have prescription insurance?  Yes Type of insurance plan: Medicaid  Does the patient qualify for medication assistance through manufacturers or grants?   Yes  Assessment: 1. Chronic systolic CHF (EF <69%), due to HTN, polysubstance abuse. NYHA class II symptoms. - Continue furosemide 40 mg daily - Continue carvedilol 6.25 mg BID - Continue losartan 50 mg daily - consider changing to Entresto pending stable labs and BP - Consider starting spironolactone at a follow up visit  Plan: 1) Medication changes recommended at this time: - Change losartan to [facility] - Provider contacted: Olena Mater  2) Patient assistance application(s): - None pending - Could qualify for Entresto patient assistance if needed   Danae Orleans, PharmD, BCPS HF Stewardship Pharmacist Phone [phone]

## 2019-11-27 MED ORDER — FUROSEMIDE 40 MG PO TABS: 40.0000 mg | ORAL_TABLET | Freq: Every day | ORAL | 3 refills | Status: DC

## 2019-11-27 MED ORDER — WARFARIN SODIUM 2.5 MG PO TABS: 7.5000 mg | ORAL_TABLET | Freq: Every day | ORAL | 0 refills | Status: DC

## 2019-11-27 MED ORDER — PANTOPRAZOLE SODIUM 20 MG PO TBEC: 20.0000 mg | DELAYED_RELEASE_TABLET | Freq: Every day | ORAL | 3 refills | Status: DC

## 2019-11-27 NOTE — Telephone Encounter (Signed)
Patient is calling to follow up regarding refill request. He states he is completely out of medication.

## 2019-11-27 NOTE — Telephone Encounter (Signed)
Warfarin refilled to [facility]

## 2019-12-04 ENCOUNTER — Ambulatory Visit (INDEPENDENT_AMBULATORY_CARE_PROVIDER_SITE_OTHER): Payer: Medicaid Other | Admitting: Pharmacist

## 2019-12-04 ENCOUNTER — Other Ambulatory Visit: Payer: Self-pay

## 2019-12-04 ENCOUNTER — Telehealth: Payer: Self-pay | Admitting: Licensed Clinical Social Worker

## 2019-12-04 DIAGNOSIS — I513 Intracardiac thrombosis, not elsewhere classified: Secondary | ICD-10-CM | POA: Diagnosis not present

## 2019-12-04 DIAGNOSIS — Z7901 Long term (current) use of anticoagulants: Secondary | ICD-10-CM | POA: Diagnosis not present

## 2019-12-04 LAB — POCT INR: INR: 1.4 — AB (ref 2.0–3.0)

## 2019-12-04 MED ORDER — PANTOPRAZOLE SODIUM 20 MG PO TBEC: 20.0000 mg | DELAYED_RELEASE_TABLET | Freq: Every day | ORAL | 3 refills | Status: DC

## 2019-12-04 MED ORDER — FLOVENT HFA 110 MCG/ACT IN AERO: 2.0000 | INHALATION_SPRAY | Freq: Two times a day (BID) | RESPIRATORY_TRACT | 1 refills | Status: DC

## 2019-12-04 MED ORDER — LOSARTAN POTASSIUM 50 MG PO TABS: 50.0000 mg | ORAL_TABLET | Freq: Every day | ORAL | 3 refills | Status: DC

## 2019-12-04 MED ORDER — ALBUTEROL SULFATE HFA 108 (90 BASE) MCG/ACT IN AERS: 2.0000 | INHALATION_SPRAY | RESPIRATORY_TRACT | 0 refills | Status: DC | PRN

## 2019-12-04 MED ORDER — CARVEDILOL 6.25 MG PO TABS: 6.2500 mg | ORAL_TABLET | Freq: Two times a day (BID) | ORAL | 3 refills | Status: DC

## 2019-12-04 MED ORDER — FUROSEMIDE 40 MG PO TABS: 40.0000 mg | ORAL_TABLET | Freq: Every day | ORAL | 3 refills | Status: DC

## 2019-12-04 MED ORDER — NITROGLYCERIN 0.4 MG SL SUBL
0.4000 mg | SUBLINGUAL_TABLET | SUBLINGUAL | 3 refills | Status: DC | PRN
Start: 2019-12-04 — End: 2021-01-11

## 2019-12-04 NOTE — Telephone Encounter (Signed)
CSW received call from pharmacy at [facility] asking for assistance for patient who states he is homeless. Patient and SO report that they have been on the street and unable to locate housing. Patient has a section 8 voucher although has had no options available locally. Patient has been referred to the [facility] well [facility] for [facility]. CSW spoke with [name] Child psychotherapist for the [place] [phone] who states she has provided patient with resources for transitional housing as well as emergency housing options although patient denied referrals/resources. CSW provided staff at [facility] with housing shelter list to provide to patient and asked to assist patient with a [employer] gift card for food for the evening. Patient instructed to call the [facility] # [phone] for further assistance for emergency housing and to follow up with his social worker [name] at the [place] for additional assistance. CSW available if needed. Lasandra Beech, LCSW, CCSW-MCS [phone]

## 2019-12-05 ENCOUNTER — Telehealth: Payer: Self-pay | Admitting: Licensed Clinical Social Worker

## 2019-12-05 NOTE — Telephone Encounter (Signed)
CSW attempted call to patient this morning to confirm if he has followed up with the resources provided yesterday in the office. CSW unable to reach as both numbers in the chart are no longer in service. Lasandra Beech, LCSW, CCSW-MCS [phone]

## 2019-12-18 ENCOUNTER — Encounter: Payer: Medicaid Other | Admitting: Physician Assistant

## 2019-12-18 NOTE — Progress Notes (Signed)
Patient is no show This encounter was created in error - please disregard. 

## 2019-12-19 ENCOUNTER — Other Ambulatory Visit: Payer: Self-pay | Admitting: Cardiology

## 2019-12-19 MED ORDER — PANTOPRAZOLE SODIUM 20 MG PO TBEC: 20.0000 mg | DELAYED_RELEASE_TABLET | Freq: Every day | ORAL | 3 refills | Status: DC

## 2019-12-19 NOTE — Telephone Encounter (Signed)
This is most likely his pantoprazole. Rx sent to [facility]

## 2019-12-19 NOTE — Telephone Encounter (Signed)
New Message   Pt says he is out of his medication.  He does not know the name, he says it is a yellow tablet that looks like Warfarin but its not. He believes he takes it for his stomach.  He asks for a 90 day refill to be sent to [facility].   Please call

## 2019-12-27 ENCOUNTER — Encounter: Payer: Medicaid Other | Admitting: Physician Assistant

## 2019-12-29 NOTE — Progress Notes (Signed)
This encounter was created in error - please disregard.

## 2020-01-24 ENCOUNTER — Other Ambulatory Visit: Payer: Self-pay | Admitting: Cardiology

## 2020-01-26 ENCOUNTER — Encounter (HOSPITAL_COMMUNITY): Payer: Self-pay | Admitting: Emergency Medicine

## 2020-01-26 ENCOUNTER — Other Ambulatory Visit: Payer: Self-pay

## 2020-01-26 ENCOUNTER — Emergency Department (HOSPITAL_COMMUNITY): Payer: Medicaid Other

## 2020-01-26 ENCOUNTER — Emergency Department (HOSPITAL_COMMUNITY)
Admission: EM | Admit: 2020-01-26 | Discharge: 2020-01-26 | Disposition: A | Discharge status: Home / Self Care | Payer: Medicaid Other | Attending: Emergency Medicine | Admitting: Emergency Medicine

## 2020-01-26 DIAGNOSIS — F17200 Nicotine dependence, unspecified, uncomplicated: Secondary | ICD-10-CM | POA: Diagnosis not present

## 2020-01-26 DIAGNOSIS — J449 Chronic obstructive pulmonary disease, unspecified: Secondary | ICD-10-CM | POA: Insufficient documentation

## 2020-01-26 DIAGNOSIS — Z955 Presence of coronary angioplasty implant and graft: Secondary | ICD-10-CM | POA: Diagnosis not present

## 2020-01-26 DIAGNOSIS — I25111 Atherosclerotic heart disease of native coronary artery with angina pectoris with documented spasm: Secondary | ICD-10-CM | POA: Insufficient documentation

## 2020-01-26 DIAGNOSIS — I5043 Acute on chronic combined systolic (congestive) and diastolic (congestive) heart failure: Secondary | ICD-10-CM | POA: Diagnosis not present

## 2020-01-26 DIAGNOSIS — R0602 Shortness of breath: Secondary | ICD-10-CM

## 2020-01-26 DIAGNOSIS — Z7901 Long term (current) use of anticoagulants: Secondary | ICD-10-CM | POA: Insufficient documentation

## 2020-01-26 DIAGNOSIS — I110 Hypertensive heart disease with heart failure: Secondary | ICD-10-CM | POA: Diagnosis not present

## 2020-01-26 DIAGNOSIS — Z7951 Long term (current) use of inhaled steroids: Secondary | ICD-10-CM | POA: Diagnosis not present

## 2020-01-26 DIAGNOSIS — Z79899 Other long term (current) drug therapy: Secondary | ICD-10-CM | POA: Insufficient documentation

## 2020-01-26 DIAGNOSIS — R072 Precordial pain: Secondary | ICD-10-CM | POA: Diagnosis not present

## 2020-01-26 LAB — CBC
HCT: 46.2 % (ref 39.0–52.0)
Hemoglobin: 14.3 g/dL (ref 13.0–17.0)
MCH: 28.8 pg (ref 26.0–34.0)
MCHC: 31 g/dL (ref 30.0–36.0)
MCV: 93 fL (ref 80.0–100.0)
Platelets: 189 10*3/uL (ref 150–400)
RBC: 4.97 MIL/uL (ref 4.22–5.81)
RDW: 16.8 % — ABNORMAL HIGH (ref 11.5–15.5)
WBC: 5.7 10*3/uL (ref 4.0–10.5)
nRBC: 0 % (ref 0.0–0.2)

## 2020-01-26 LAB — BASIC METABOLIC PANEL
Anion gap: 9 (ref 5–15)
BUN: 24 mg/dL — ABNORMAL HIGH (ref 6–20)
CO2: 26 mmol/L (ref 22–32)
Calcium: 9 mg/dL (ref 8.9–10.3)
Chloride: 103 mmol/L (ref 98–111)
Creatinine, Ser: 1.64 mg/dL — ABNORMAL HIGH (ref 0.61–1.24)
GFR, Estimated: 45 mL/min — ABNORMAL LOW (ref >60)
Glucose, Bld: 120 mg/dL — ABNORMAL HIGH (ref 70–99)
Potassium: 4.8 mmol/L (ref 3.5–5.1)
Sodium: 138 mmol/L (ref 135–145)

## 2020-01-26 LAB — TROPONIN I (HIGH SENSITIVITY)
Troponin I (High Sensitivity): 33 ng/L — ABNORMAL HIGH (ref <18)
Troponin I (High Sensitivity): 31 ng/L — ABNORMAL HIGH (ref <18)

## 2020-01-26 MED ORDER — FUROSEMIDE 10 MG/ML IJ SOLN
40.0000 mg | Freq: Once | INTRAMUSCULAR | Status: AC
  Administered 2020-01-26: 40 mg via INTRAVENOUS
  Filled 2020-01-26: qty 4

## 2020-01-26 MED ORDER — ALUM & MAG HYDROXIDE-SIMETH 200-200-20 MG/5ML PO SUSP
30.0000 mL | Freq: Once | ORAL | Status: AC
  Administered 2020-01-26: 30 mL via ORAL
  Filled 2020-01-26: qty 30

## 2020-01-26 MED ORDER — LIDOCAINE VISCOUS HCL 2 % MT SOLN
15.0000 mL | Freq: Once | OROMUCOSAL | Status: AC
  Administered 2020-01-26: 15 mL via ORAL
  Filled 2020-01-26: qty 15

## 2020-01-26 MED ORDER — DEXAMETHASONE 4 MG PO TABS
10.0000 mg | ORAL_TABLET | Freq: Once | ORAL | Status: AC
  Administered 2020-01-26: 10 mg via ORAL
  Filled 2020-01-26: qty 3

## 2020-01-26 NOTE — ED Triage Notes (Signed)
Patient arrived with EMS from street (homeless) reports central chest pain with SOB and chest congestion onset yesterday , history of MI /CHF , denies fever , no emesis or diaphoresis .

## 2020-01-26 NOTE — ED Provider Notes (Signed)
[facility] EMERGENCY DEPARTMENT Provider Note   CSN: [id number] Arrival date & time: 01/26/20  0134     History Chief Complaint  Patient presents with  . Chest Pain    Allen Mayer is a 59 y.o. male.  The history is provided by the patient.  Chest Pain Pain location:  Substernal area Pain quality: aching   Pain radiates to:  Does not radiate Pain severity:  Mild Onset quality:  Gradual Timing:  Intermittent Progression:  Waxing and waning Chronicity:  Recurrent Context: at rest   Associated symptoms: shortness of breath   Associated symptoms: no abdominal pain, no back pain, no cough, no fever, no lower extremity edema, no palpitations and no vomiting   Risk factors: coronary artery disease        Past Medical History:  Diagnosis Date  . Acid reflux   . Arthritis   . Asthma   . Back pain   . Bronchitis   . COPD (chronic obstructive pulmonary disease) (HCC)   . Hypertension   . Irregular heart beat   . LV (left ventricular) mural thrombus   . Myocardial infarction (HCC)   . Neuropathic pain   . NICM (nonischemic cardiomyopathy) (HCC) 2019  . RVF (right ventricular failure) (HCC)   . Schizo affective schizophrenia [facility])     Patient Active Problem List   Diagnosis Date Noted  . Long term (current) use of anticoagulants 11/14/2019  . RUQ pain   . Acute on chronic combined systolic and diastolic CHF (congestive heart failure) (HCC) 10/28/2019  . LV (left ventricular) mural thrombus   . Acute exacerbation of CHF (congestive heart failure) (HCC) 10/27/2019  . Acute respiratory failure with hypoxia (HCC) 10/27/2019  . SOB (shortness of breath)   . Coronary artery calcification seen on CAT scan   . DCM (dilated cardiomyopathy) (HCC)   . Acute CHF (congestive heart failure) (HCC) 12/11/2017  . Elevated troponin 12/11/2017  . COPD (chronic obstructive pulmonary disease) (HCC) 12/11/2017  . 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 Tobacco abuse 11/19/2014  . Essential hypertension, benign 11/19/2014  . Neuropathic pain     Past Surgical History:  Procedure Laterality Date  . None    . RIGHT/LEFT HEART CATH AND CORONARY ANGIOGRAPHY N/A 12/13/2017   Procedure: RIGHT/LEFT HEART CATH AND CORONARY ANGIOGRAPHY;  Surgeon: Runell Gess, MD;  Location: [facility] INVASIVE CV LAB;  Service: Cardiovascular;  Laterality: N/A;       Family History  Problem Relation Age of Onset  . Heart attack Mother        Died age 63  . Heart attack Brother        52    Social History   Tobacco Use  . Smoking status: Current Every Day Smoker    Packs/day: 0.50  . Smokeless tobacco: Never Used  Vaping Use  . Vaping Use: Never used  Substance Use Topics  . Alcohol use: Yes    Comment: occas  . Drug use: Not Currently    Types: Cocaine, Marijuana    Home Medications Prior to Admission medications   Medication Sig Start Date End Date Taking? Authorizing Provider  albuterol (VENTOLIN HFA) 108 (90 Base) MCG/ACT inhaler Inhale 2 puffs into the lungs every 4 (four) hours as needed for wheezing or shortness of breath. 12/04/19   Rollene Rotunda, MD  baclofen (LIORESAL) 10 MG tablet Take 10 mg by mouth 3 (three) times daily as needed for muscle  spasms. LF 10/09/19 30DS 10/09/19   [provider]  benztropine (COGENTIN) 1 MG tablet Take 1 tablet (1 mg total) by mouth at bedtime. 06/22/17   Pisciotta, Joni Reining, PA-C  carvedilol (COREG) 6.25 MG tablet Take 1 tablet (6.25 mg total) by mouth 2 (two) times daily. 12/04/19   Rollene Rotunda, MD  citalopram (CELEXA) 20 MG tablet Take 1 tablet (20 mg total) by mouth daily. 06/22/17   Pisciotta, Joni Reining, PA-C  FLOVENT HFA 110 MCG/ACT inhaler Inhale 2 puffs into the lungs 2 (two) times daily. 12/04/19   Rollene Rotunda, MD  furosemide (LASIX) 40 MG tablet Take 1 tablet (40 mg total) by mouth daily. 12/04/19   Rollene Rotunda, MD  gabapentin (NEURONTIN) 400  MG capsule Take 1 capsule (400 mg total) by mouth daily as needed (nerve pain). 11/02/19   Amin, Loura Halt, MD  losartan (COZAAR) 50 MG tablet Take 1 tablet (50 mg total) by mouth daily. 12/04/19 03/03/20  Rollene Rotunda, MD  nitroGLYCERIN (NITROSTAT) 0.4 MG SL tablet Place 1 tablet (0.4 mg total) under the tongue every 5 (five) minutes as needed for chest pain. 12/04/19   Rollene Rotunda, MD  oxyCODONE (OXY IR/ROXICODONE) 5 MG immediate release tablet Take 1 tablet (5 mg total) by mouth 3 (three) times daily as needed (pain). Patient taking differently: Take 10 mg by mouth 3 (three) times daily as needed (pain).  11/02/19   Amin, Loura Halt, MD  paliperidone (INVEGA) 6 MG 24 hr tablet Take 1 tablet (6 mg total) by mouth daily. 06/22/17   Pisciotta, Joni Reining, PA-C  pantoprazole (PROTONIX) 20 MG tablet Take 1 tablet (20 mg total) by mouth daily before breakfast. 12/19/19   Rollene Rotunda, MD  warfarin (COUMADIN) 2.5 MG tablet Take 3 tablets (7.5 mg total) by mouth daily. 11/27/19 12/27/19  Rollene Rotunda, MD    Allergies    Patient has no known allergies.  Review of Systems   Review of Systems  Constitutional: Negative for chills and fever.  HENT: Negative for ear pain and sore throat.   Eyes: Negative for pain and visual disturbance.  Respiratory: Positive for shortness of breath. Negative for cough.   Cardiovascular: Positive for chest pain. Negative for palpitations.  Gastrointestinal: Negative for abdominal pain and vomiting.  Genitourinary: Negative for dysuria and hematuria.  Musculoskeletal: Negative for arthralgias and back pain.  Skin: Negative for color change and rash.  Neurological: Negative for seizures and syncope.  All other systems reviewed and are negative.   Physical Exam Updated Vital Signs  ED Triage Vitals  Enc Vitals Group     BP 01/26/20 0202 (!) 133/107     Pulse Rate 01/26/20 0202 92     Resp 01/26/20 0202 18     Temp 01/26/20 0202 97.9 F (36.6 C)     Temp  Source 01/26/20 0202 Oral     SpO2 01/26/20 0202 97 %     Weight 01/26/20 0138 209 lb 7 oz (95 kg)     Height 01/26/20 0138 6' (1.829 m)     Head Circumference --      Peak Flow --      Pain Score 01/26/20 0138 0     Pain Loc --      Pain Edu? --      Excl. in GC? --     Physical Exam Vitals and nursing note reviewed.  Constitutional:      General: He is not in acute distress.    Appearance: He is well-developed.  He is not ill-appearing.  HENT:     Head: Normocephalic and atraumatic.  Eyes:     Conjunctiva/sclera: Conjunctivae normal.     Pupils: Pupils are equal, round, and reactive to light.  Cardiovascular:     Rate and Rhythm: Normal rate and regular rhythm.     Pulses:          Radial pulses are 2+ on the right side and 2+ on the left side.     Heart sounds: Normal heart sounds. No murmur heard.   Pulmonary:     Effort: Pulmonary effort is normal. No respiratory distress.     Breath sounds: Normal breath sounds. No decreased breath sounds, wheezing or rhonchi.  Abdominal:     Palpations: Abdomen is soft.     Tenderness: There is no abdominal tenderness.  Musculoskeletal:     Cervical back: Normal range of motion and neck supple.     Right lower leg: Edema (1+) present.     Left lower leg: Edema (1+) present.  Skin:    General: Skin is warm and dry.     Capillary Refill: Capillary refill takes less than 2 seconds.  Neurological:     General: No focal deficit present.     Mental Status: He is alert.  Psychiatric:        Mood and Affect: Mood normal.     ED Results / Procedures / Treatments   Labs (all labs ordered are listed, but only abnormal results are displayed) Labs Reviewed  BASIC METABOLIC PANEL - Abnormal; Notable for the following components:      Result Value   Glucose, Bld 120 (*)    BUN 24 (*)    Creatinine, Ser 1.64 (*)    GFR, Estimated 45 (*)    All other components within normal limits  CBC - Abnormal; Notable for the following  components:   RDW 16.8 (*)    All other components within normal limits  TROPONIN I (HIGH SENSITIVITY) - Abnormal; Notable for the following components:   Troponin I (High Sensitivity) 33 (*)    All other components within normal limits  TROPONIN I (HIGH SENSITIVITY) - Abnormal; Notable for the following components:   Troponin I (High Sensitivity) 31 (*)    All other components within normal limits    EKG EKG Interpretation  Date/Time:  Sunday January 26 2020 01:47:57 EDT Ventricular Rate:  96 PR Interval:  206 QRS Duration: 84 QT Interval:  388 QTC Calculation: 490 R Axis:   -66 Text Interpretation: Sinus rhythm with Premature atrial complexes Left axis deviation Inferior infarct , age undetermined Anterior infarct , age undetermined Abnormal ECG When compared with ECG of 10/28/2019, No significant change was found Confirmed by Dione Booze ([id number]) on 01/26/2020 4:42:51 AM Also confirmed by Virgina Norfolk [phone])  on 01/26/2020 7:05:17 AM   Radiology DG Chest 2 View  Result Date: 01/26/2020 CLINICAL DATA:  59 year old male with chest pain. EXAM: CHEST - 2 VIEW COMPARISON:  Chest radiograph dated 10/27/2019 FINDINGS: There is stable moderate cardiomegaly with mild vascular congestion. No focal consolidation, pleural effusion, pneumothorax. No acute osseous pathology. IMPRESSION: Cardiomegaly with mild vascular congestion. No focal consolidation. Electronically Signed   By: Elgie Collard M.D.   On: 01/26/2020 02:30    Procedures Procedures (including critical care time)  Medications Ordered in ED Medications  furosemide (LASIX) injection 40 mg (has no administration in time range)  dexamethasone (DECADRON) tablet 10 mg (has no administration in time range)  ED Course  I have reviewed the triage vital signs and the nursing notes.  Pertinent labs & imaging results that were available during my care of the patient were reviewed by me and considered in my medical decision  making (see chart for details).    MDM Rules/Calculators/A&P                          Allen Mayer is a 59 year old male with history of heart failure, CAD, cocaine abuse who presents to the ED with chest pain, shortness of breath, chest congestion.  Patient normal vitals, no fever.  Lab work and imaging has been done prior to my evaluation.  Troponin stable x2 at 30.  This is improved from his baseline.  Chest x-ray shows no obvious pneumonia.  Does have some mild vascular congestion.  Does have some leg swelling but overall appears comfortable on exam.  No respiratory distress.  Normal work of breathing.  Denies any abdominal pain.  Suspect some reactive airway process versus some fluid overload.  Given an extra dose of Lasix and Decadron in the ED.  He states that he has an inhaler.  We will have him increase his Lasix dose over the next several days and follow-up with primary care cardiology.  Not having any active chest pain.  Doubt ACS.  Patient is on blood thinner and doubt PE.  Discharged in ED in good condition.  Understands return precautions.  This chart was dictated using voice recognition software.  Despite best efforts to proofread,  errors can occur which can change the documentation meaning.    Final Clinical Impression(s) / ED Diagnoses Final diagnoses:  SOB (shortness of breath)    Rx / DC Orders ED Discharge Orders    None       Virgina Norfolk, DO 01/26/20 7253

## 2020-01-26 NOTE — ED Notes (Signed)
Patient Alert and oriented to baseline. Stable and ambulatory to baseline. Patient verbalized understanding of the discharge instructions.  Patient belongings were taken by the patient.   

## 2020-01-26 NOTE — Discharge Instructions (Signed)
Take 40 mg of furosemide/Lasix twice a day for the next 4 days and then continue daily dose.  Follow-up with cardiology and primary care.

## 2020-02-09 ENCOUNTER — Emergency Department (HOSPITAL_COMMUNITY): Payer: Medicaid Other

## 2020-02-09 ENCOUNTER — Emergency Department (HOSPITAL_COMMUNITY)
Admission: EM | Admit: 2020-02-09 | Discharge: 2020-02-09 | Disposition: A | Discharge status: Home / Self Care | Payer: Medicaid Other | Attending: Emergency Medicine | Admitting: Emergency Medicine

## 2020-02-09 DIAGNOSIS — J449 Chronic obstructive pulmonary disease, unspecified: Secondary | ICD-10-CM | POA: Diagnosis not present

## 2020-02-09 DIAGNOSIS — I130 Hypertensive heart and chronic kidney disease with heart failure and stage 1 through stage 4 chronic kidney disease, or unspecified chronic kidney disease: Secondary | ICD-10-CM | POA: Insufficient documentation

## 2020-02-09 DIAGNOSIS — N289 Disorder of kidney and ureter, unspecified: Secondary | ICD-10-CM

## 2020-02-09 DIAGNOSIS — R778 Other specified abnormalities of plasma proteins: Secondary | ICD-10-CM

## 2020-02-09 DIAGNOSIS — R7989 Other specified abnormal findings of blood chemistry: Secondary | ICD-10-CM | POA: Insufficient documentation

## 2020-02-09 DIAGNOSIS — N189 Chronic kidney disease, unspecified: Secondary | ICD-10-CM | POA: Diagnosis not present

## 2020-02-09 DIAGNOSIS — J45909 Unspecified asthma, uncomplicated: Secondary | ICD-10-CM | POA: Insufficient documentation

## 2020-02-09 DIAGNOSIS — R0602 Shortness of breath: Secondary | ICD-10-CM | POA: Diagnosis present

## 2020-02-09 DIAGNOSIS — M791 Myalgia, unspecified site: Secondary | ICD-10-CM | POA: Diagnosis not present

## 2020-02-09 DIAGNOSIS — I5043 Acute on chronic combined systolic (congestive) and diastolic (congestive) heart failure: Secondary | ICD-10-CM | POA: Insufficient documentation

## 2020-02-09 DIAGNOSIS — F17200 Nicotine dependence, unspecified, uncomplicated: Secondary | ICD-10-CM | POA: Diagnosis not present

## 2020-02-09 DIAGNOSIS — Z7901 Long term (current) use of anticoagulants: Secondary | ICD-10-CM | POA: Diagnosis not present

## 2020-02-09 LAB — COMPREHENSIVE METABOLIC PANEL
ALT: 43 U/L (ref 0–44)
AST: 42 U/L — ABNORMAL HIGH (ref 15–41)
Albumin: 3.7 g/dL (ref 3.5–5.0)
Alkaline Phosphatase: 66 U/L (ref 38–126)
Anion gap: 11 (ref 5–15)
BUN: 26 mg/dL — ABNORMAL HIGH (ref 8–23)
CO2: 25 mmol/L (ref 22–32)
Calcium: 8.9 mg/dL (ref 8.9–10.3)
Chloride: 103 mmol/L (ref 98–111)
Creatinine, Ser: 1.74 mg/dL — ABNORMAL HIGH (ref 0.61–1.24)
GFR, Estimated: 44 mL/min — ABNORMAL LOW (ref >60)
Glucose, Bld: 85 mg/dL (ref 70–99)
Potassium: 4.5 mmol/L (ref 3.5–5.1)
Sodium: 139 mmol/L (ref 135–145)
Total Bilirubin: 1.6 mg/dL — ABNORMAL HIGH (ref 0.3–1.2)
Total Protein: 6.5 g/dL (ref 6.5–8.1)

## 2020-02-09 LAB — CBC WITH DIFFERENTIAL/PLATELET
Abs Immature Granulocytes: 0.01 10*3/uL (ref 0.00–0.07)
Basophils Absolute: 0.1 10*3/uL (ref 0.0–0.1)
Basophils Relative: 1 %
Eosinophils Absolute: 0.1 10*3/uL (ref 0.0–0.5)
Eosinophils Relative: 2 %
HCT: 47.2 % (ref 39.0–52.0)
Hemoglobin: 15.1 g/dL (ref 13.0–17.0)
Immature Granulocytes: 0 %
Lymphocytes Relative: 33 %
Lymphs Abs: 1.9 10*3/uL (ref 0.7–4.0)
MCH: 29.5 pg (ref 26.0–34.0)
MCHC: 32 g/dL (ref 30.0–36.0)
MCV: 92.2 fL (ref 80.0–100.0)
Monocytes Absolute: 0.5 10*3/uL (ref 0.1–1.0)
Monocytes Relative: 9 %
Neutro Abs: 3.2 10*3/uL (ref 1.7–7.7)
Neutrophils Relative %: 55 %
Platelets: 186 10*3/uL (ref 150–400)
RBC: 5.12 MIL/uL (ref 4.22–5.81)
RDW: 16.4 % — ABNORMAL HIGH (ref 11.5–15.5)
WBC: 5.8 10*3/uL (ref 4.0–10.5)
nRBC: 0 % (ref 0.0–0.2)

## 2020-02-09 LAB — PROTIME-INR
INR: 1.1 (ref 0.8–1.2)
Prothrombin Time: 14.1 seconds (ref 11.4–15.2)

## 2020-02-09 LAB — TROPONIN I (HIGH SENSITIVITY)
Troponin I (High Sensitivity): 44 ng/L — ABNORMAL HIGH (ref <18)
Troponin I (High Sensitivity): 33 ng/L — ABNORMAL HIGH (ref <18)

## 2020-02-09 LAB — BRAIN NATRIURETIC PEPTIDE: B Natriuretic Peptide: 2243.3 pg/mL — ABNORMAL HIGH (ref 0.0–100.0)

## 2020-02-09 MED ORDER — ALBUTEROL SULFATE HFA 108 (90 BASE) MCG/ACT IN AERS
2.0000 | INHALATION_SPRAY | Freq: Once | RESPIRATORY_TRACT | Status: AC
  Administered 2020-02-09: 2 via RESPIRATORY_TRACT
  Filled 2020-02-09: qty 6.7

## 2020-02-09 MED ORDER — FUROSEMIDE 40 MG PO TABS
40.0000 mg | ORAL_TABLET | Freq: Every day | ORAL | 3 refills | Status: DC
Start: 2020-02-09 — End: 2020-03-03

## 2020-02-09 MED ORDER — FUROSEMIDE 10 MG/ML IJ SOLN
40.0000 mg | Freq: Once | INTRAMUSCULAR | Status: AC
  Administered 2020-02-09: 40 mg via INTRAVENOUS
  Filled 2020-02-09: qty 4

## 2020-02-09 NOTE — ED Notes (Signed)
Discharge paperwork and prescription reviewed with pt.  Pt requesting bus pass, which was provided.  Pt wheeled to ED entrance at time of discharge.

## 2020-02-09 NOTE — Discharge Instructions (Addendum)
You were evaluated in the Emergency Department and after careful evaluation, we did not find any emergent condition requiring admission or further testing in the hospital.  Your exam/testing today was overall reassuring.  Your symptoms seem to be due to a mild flare of your heart failure.  It is very important you take the fluid pills at home.  We recommend close follow-up with your PCP or cardiologist.  Please return to the Emergency Department if you experience any worsening of your condition.  Thank you for allowing [place] to be a part of your care.

## 2020-02-09 NOTE — ED Provider Notes (Signed)
[facility]-EMERGENCY DEPT Provider Note   CSN: [id number] Arrival date & time: 02/09/20  3762   History Chief Complaint  Patient presents with  . Shortness of Breath    Allen Mayer is a 59 y.o. male.  The history is provided by the patient.  Shortness of Breath He has history of hypertension, COPD, systolic and diastolic heart failure, schizophrenia and comes in with multiple complaints.  He states that he has not taken his furosemide in about a week because he left the and a place that he is not staying but states that he has been taking his warfarin.  He states that his legs have been swollen but when he takes furosemide and his leg swelling goes down, his face started swelling.  He did have some chest pain earlier and took some nitroglycerin.  He is complaining of some generalized body aches.  There has been some shortness of breath and a nonproductive cough.  He has been immunized against COVID-19.  When I tried to impress him in details of any of these complaints, I could conflicting answers and patient gets somewhat agitated and annoyed that I am asking questions.  He does state that he thinks he is allergic to steroids because he was given a prescription for steroids when he was in the ED recently and his face swelled up following that.  Past Medical History:  Diagnosis Date  . Acid reflux   . Arthritis   . Asthma   . Back pain   . Bronchitis   . COPD (chronic obstructive pulmonary disease) (HCC)   . Hypertension   . Irregular heart beat   . LV (left ventricular) mural thrombus   . Myocardial infarction (HCC)   . Neuropathic pain   . NICM (nonischemic cardiomyopathy) (HCC) 2019  . RVF (right ventricular failure) (HCC)   . Schizo affective schizophrenia [facility])     Patient Active Problem List   Diagnosis Date Noted  . Long term (current) use of anticoagulants 11/14/2019  . RUQ pain   . Acute on chronic combined systolic and diastolic CHF  (congestive heart failure) (HCC) 10/28/2019  . LV (left ventricular) mural thrombus   . Acute exacerbation of CHF (congestive heart failure) (HCC) 10/27/2019  . Acute respiratory failure with hypoxia (HCC) 10/27/2019  . SOB (shortness of breath)   . Coronary artery calcification seen on CAT scan   . DCM (dilated cardiomyopathy) (HCC)   . Acute CHF (congestive heart failure) (HCC) 12/11/2017  . Elevated troponin 12/11/2017  . COPD (chronic obstructive pulmonary disease) (HCC) 12/11/2017  . 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 Tobacco abuse 11/19/2014  . Essential hypertension, benign 11/19/2014  . Neuropathic pain     Past Surgical History:  Procedure Laterality Date  . None    . RIGHT/LEFT HEART CATH AND CORONARY ANGIOGRAPHY N/A 12/13/2017   Procedure: RIGHT/LEFT HEART CATH AND CORONARY ANGIOGRAPHY;  Surgeon: Runell Gess, MD;  Location: [facility] INVASIVE CV LAB;  Service: Cardiovascular;  Laterality: N/A;       Family History  Problem Relation Age of Onset  . Heart attack Mother        Died age 76  . Heart attack Brother        29    Social History   Tobacco Use  . Smoking status: Current Every Day Smoker    Packs/day: 0.50  . Smokeless tobacco: Never Used  Vaping Use  . Vaping Use: Never used  Substance Use Topics  . Alcohol use: Yes    Comment: occas  . Drug use: Not Currently    Types: Cocaine, Marijuana    Home Medications Prior to Admission medications   Medication Sig Start Date End Date Taking? Authorizing Provider  albuterol (VENTOLIN HFA) 108 (90 Base) MCG/ACT inhaler Inhale 2 puffs into the lungs every 4 (four) hours as needed for wheezing or shortness of breath. 12/04/19   Rollene Rotunda, MD  baclofen (LIORESAL) 10 MG tablet Take 10 mg by mouth 3 (three) times daily as needed for muscle spasms. LF 10/09/19 30DS 10/09/19   [provider]  benztropine (COGENTIN) 1 MG tablet Take 1 tablet (1 mg  total) by mouth at bedtime. 06/22/17   Pisciotta, Joni Reining, PA-C  carvedilol (COREG) 6.25 MG tablet Take 1 tablet (6.25 mg total) by mouth 2 (two) times daily. 12/04/19   Rollene Rotunda, MD  citalopram (CELEXA) 20 MG tablet Take 1 tablet (20 mg total) by mouth daily. 06/22/17   Pisciotta, Joni Reining, PA-C  FLOVENT HFA 110 MCG/ACT inhaler Inhale 2 puffs into the lungs 2 (two) times daily. 12/04/19   Rollene Rotunda, MD  furosemide (LASIX) 40 MG tablet Take 1 tablet (40 mg total) by mouth daily. 12/04/19   Rollene Rotunda, MD  gabapentin (NEURONTIN) 400 MG capsule Take 1 capsule (400 mg total) by mouth daily as needed (nerve pain). 11/02/19   Amin, Loura Halt, MD  losartan (COZAAR) 50 MG tablet Take 1 tablet (50 mg total) by mouth daily. 12/04/19 03/03/20  Rollene Rotunda, MD  nitroGLYCERIN (NITROSTAT) 0.4 MG SL tablet Place 1 tablet (0.4 mg total) under the tongue every 5 (five) minutes as needed for chest pain. 12/04/19   Rollene Rotunda, MD  oxyCODONE (OXY IR/ROXICODONE) 5 MG immediate release tablet Take 1 tablet (5 mg total) by mouth 3 (three) times daily as needed (pain). Patient taking differently: Take 10 mg by mouth 3 (three) times daily as needed (pain).  11/02/19   Amin, Loura Halt, MD  paliperidone (INVEGA) 6 MG 24 hr tablet Take 1 tablet (6 mg total) by mouth daily. 06/22/17   Pisciotta, Joni Reining, PA-C  pantoprazole (PROTONIX) 20 MG tablet Take 1 tablet (20 mg total) by mouth daily before breakfast. 12/19/19   Rollene Rotunda, MD  warfarin (COUMADIN) 2.5 MG tablet Take 3 tablets (7.5 mg total) by mouth daily. 11/27/19 12/27/19  Rollene Rotunda, MD    Allergies    Patient has no known allergies.  Review of Systems   Review of Systems  Respiratory: Positive for shortness of breath.   All other systems reviewed and are negative.   Physical Exam Updated Vital Signs BP (!) 125/95 (BP Location: Right Arm)   Pulse 94   Temp (!) 97.5 F (36.4 C) (Oral)   Resp 20   Wt 97.5 kg   SpO2 99%   BMI 29.16  kg/m   Physical Exam Vitals and nursing note reviewed.   59 year old male, resting comfortably and in no acute distress. Vital signs are significant for mildly elevated diastolic blood pressure. Oxygen saturation is 99%, which is normal. Head is normocephalic and atraumatic. PERRLA, EOMI. Oropharynx is clear. Neck is nontender and supple without adenopathy. JVD is present. Back is nontender and there is no CVA tenderness.  There is trace presacral edema. Lungs have few rales at the left base.  Intermittent cough is present which is wheezy. Chest is nontender. Heart has regular rate and rhythm without murmur. Abdomen is soft, flat, nontender without  masses or hepatosplenomegaly and peristalsis is normoactive. Extremities have 2+ pretibial edema, full range of motion is present. Skin is warm and dry without rash. Neurologic: Mental status is normal, cranial nerves are intact, there are no motor or sensory deficits.  ED Results / Procedures / Treatments   Labs (all labs ordered are listed, but only abnormal results are displayed) Labs Reviewed  COMPREHENSIVE METABOLIC PANEL - Abnormal; Notable for the following components:      Result Value   BUN 26 (*)    Creatinine, Ser 1.74 (*)    AST 42 (*)    Total Bilirubin 1.6 (*)    GFR, Estimated 44 (*)    All other components within normal limits  CBC WITH DIFFERENTIAL/PLATELET - Abnormal; Notable for the following components:   RDW 16.4 (*)    All other components within normal limits  BRAIN NATRIURETIC PEPTIDE - Abnormal; Notable for the following components:   B Natriuretic Peptide 2,243.3 (*)    All other components within normal limits  TROPONIN I (HIGH SENSITIVITY) - Abnormal; Notable for the following components:   Troponin I (High Sensitivity) 44 (*)    All other components within normal limits  PROTIME-INR  TROPONIN I (HIGH SENSITIVITY)    EKG EKG Interpretation  Date/Time:  Sunday February 09 2020 03:50:22  EDT Ventricular Rate:  91 PR Interval:    QRS Duration: 94 QT Interval:  371 QTC Calculation: 457 R Axis:   -50 Text Interpretation: Sinus rhythm Probable left atrial enlargement Inferior infarct, old Anterior infarct, old Lateral leads are also involved When compared with ECG of 01/26/2020, No significant change was found Confirmed by Dione Booze ([id number]) on 02/09/2020 3:57:47 AM   Radiology DG Chest 2 View  Result Date: 02/09/2020 CLINICAL DATA:  Shortness of breath EXAM: CHEST - 2 VIEW COMPARISON:  01/26/2020 FINDINGS: Moderate cardiomegaly. No pleural effusion or pneumothorax. No focal airspace consolidation. IMPRESSION: Moderate cardiomegaly without focal airspace disease. Electronically Signed   By: Deatra Robinson M.D.   On: 02/09/2020 04:23    Procedures Procedures   Medications Ordered in ED Medications  albuterol (VENTOLIN HFA) 108 (90 Base) MCG/ACT inhaler 2 puff (2 puffs Inhalation Given 02/09/20 0431)  furosemide (LASIX) injection 40 mg (40 mg Intravenous Given 02/09/20 0430)    ED Course  I have reviewed the triage vital signs and the nursing notes.  Pertinent labs & imaging results that were available during my care of the patient were reviewed by me and considered in my medical decision making (see chart for details).  MDM Rules/Calculators/A&P Patient with multiple complaints.  He does appear to be fluid overloaded and this is likely secondary to medication noncompliance.  There is also some evidence of bronchospasm consistent with COPD exacerbation.  Will check chest x-ray and screening labs.  ECG is unchanged from prior.  Chest x-ray shows cardiomegaly without overt pulmonary edema.  Creatinine is slightly higher than prior, BNP markedly elevated.  This is consistent with his history of not having taken his diuretic.  Troponin is mildly elevated and slightly higher than it had been previously.  We will need to get delta troponin.  Case is signed out to Dr.  Pilar Plate.  Final Clinical Impression(s) / ED Diagnoses Final diagnoses:  Acute on chronic combined systolic and diastolic heart failure (HCC)  Renal insufficiency  Elevated troponin    Rx / DC Orders ED Discharge Orders    None       Dione Booze, MD 02/09/20 0740

## 2020-02-09 NOTE — ED Notes (Signed)
Pt given a [place] sandwich and a sprite.

## 2020-02-09 NOTE — ED Triage Notes (Signed)
Pt report increased SOB and bilat LE swelling over the course of the last week.  PT is homeless and has been unable to take his prescribed medications.

## 2020-02-09 NOTE — ED Provider Notes (Signed)
  Provider Note MRN:  [id number]  Arrival date & time: 02/09/20    ED Course and Medical Decision Making  Assumed care from Dr. Preston Fleeting at shift change.  Shortness of breath, noncompliance with medication, likely a component of CHF but chest x-ray is reassuring, normal vital signs, providing dose Lasix and will reassess.  Candidate for discharge.  Good response to Lasix, on my reassessment patient is resting comfortably, eating a large plate of breakfast, normal vital signs, does have pitting edema to the legs but this can be managed as an outpatient, will refill Lasix.  Procedures  Final Clinical Impressions(s) / ED Diagnoses     ICD-10-CM   1. Acute on chronic combined systolic and diastolic heart failure (HCC)  F53.79   2. Renal insufficiency  N28.9   3. Elevated troponin  R77.8     ED Discharge Orders    None      Discharge Instructions   None     Elmer Sow. Pilar Plate, MD [facility] [email or web address]    Sabas Sous, MD 02/09/20 [phone]

## 2020-02-11 ENCOUNTER — Emergency Department (HOSPITAL_COMMUNITY)
Admission: EM | Admit: 2020-02-11 | Discharge: 2020-02-11 | Disposition: A | Discharge status: Home / Self Care | Payer: Medicaid Other | Attending: Emergency Medicine | Admitting: Emergency Medicine

## 2020-02-11 ENCOUNTER — Other Ambulatory Visit: Payer: Self-pay

## 2020-02-11 ENCOUNTER — Encounter (HOSPITAL_COMMUNITY): Payer: Self-pay

## 2020-02-11 DIAGNOSIS — I5043 Acute on chronic combined systolic (congestive) and diastolic (congestive) heart failure: Secondary | ICD-10-CM | POA: Insufficient documentation

## 2020-02-11 DIAGNOSIS — Z7951 Long term (current) use of inhaled steroids: Secondary | ICD-10-CM | POA: Diagnosis not present

## 2020-02-11 DIAGNOSIS — I110 Hypertensive heart disease with heart failure: Secondary | ICD-10-CM | POA: Diagnosis not present

## 2020-02-11 DIAGNOSIS — J45909 Unspecified asthma, uncomplicated: Secondary | ICD-10-CM | POA: Insufficient documentation

## 2020-02-11 DIAGNOSIS — I25119 Atherosclerotic heart disease of native coronary artery with unspecified angina pectoris: Secondary | ICD-10-CM | POA: Diagnosis not present

## 2020-02-11 DIAGNOSIS — Z7901 Long term (current) use of anticoagulants: Secondary | ICD-10-CM | POA: Insufficient documentation

## 2020-02-11 DIAGNOSIS — Z79899 Other long term (current) drug therapy: Secondary | ICD-10-CM | POA: Diagnosis not present

## 2020-02-11 DIAGNOSIS — F17200 Nicotine dependence, unspecified, uncomplicated: Secondary | ICD-10-CM | POA: Diagnosis not present

## 2020-02-11 DIAGNOSIS — Z5900 Homelessness unspecified: Secondary | ICD-10-CM | POA: Diagnosis not present

## 2020-02-11 DIAGNOSIS — M79673 Pain in unspecified foot: Secondary | ICD-10-CM | POA: Diagnosis not present

## 2020-02-11 DIAGNOSIS — J449 Chronic obstructive pulmonary disease, unspecified: Secondary | ICD-10-CM | POA: Insufficient documentation

## 2020-02-11 DIAGNOSIS — R609 Edema, unspecified: Secondary | ICD-10-CM | POA: Diagnosis not present
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
## 2020-02-11 NOTE — Discharge Instructions (Signed)
You can pick up your lasix prescription at your pharmacy.
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
## 2020-02-19 ENCOUNTER — Encounter: Payer: Self-pay | Admitting: *Deleted

## 2020-02-19 ENCOUNTER — Telehealth: Payer: Self-pay | Admitting: *Deleted

## 2020-02-19 NOTE — Telephone Encounter (Signed)
Called and made an appt with Dr Antoine Poche for 02/24/20 at 11:20.

## 2020-02-19 NOTE — Telephone Encounter (Signed)
Scheduled a ride for client to Dr [facility] at [address] [place]

## 2020-02-19 NOTE — Congregational Nurse Program (Signed)
  Dept: [phone]   Congregational Nurse Program Note  Date of Encounter: 02/19/2020  Past Medical History: Past Medical History:  Diagnosis Date  . Acid reflux   . Arthritis   . Asthma   . Back pain   . Bronchitis   . COPD (chronic obstructive pulmonary disease) (HCC)   . Hypertension   . Irregular heart beat   . LV (left ventricular) mural thrombus   . Myocardial infarction (HCC)   . Neuropathic pain   . NICM (nonischemic cardiomyopathy) (HCC) 2019  . RVF (right ventricular failure) (HCC)   . Schizo affective schizophrenia [facility])     Encounter Details:  CNP Questionnaire - 02/19/20 1501      Questionnaire   Do you give verbal consent to treat you today? Yes    Visit Setting Church or Organization    Location Patient Served At [facility]    Patient Status Homeless    Medical Provider Yes    Insurance Medicaid;VA Insurance    Intervention Refer;Support    Housing/Utilities No permanent housing    Transportation Need transportation assistance    Referrals PCP - other provider          Client seen at [facility] following recent visit to hospital. He requested help with transportation to his doctor. Called Dr Antoine Poche [facility] Cardiologist and made an appt for 02/24/20 at 11:20. Called [facility] and scheduled a ride to appt. Jiya Kissinger W RN CN  [phone]

## 2020-02-23 DIAGNOSIS — I5023 Acute on chronic systolic (congestive) heart failure: Secondary | ICD-10-CM | POA: Insufficient documentation

## 2020-02-23 DIAGNOSIS — I5022 Chronic systolic (congestive) heart failure: Secondary | ICD-10-CM | POA: Insufficient documentation
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
## 2020-02-24 ENCOUNTER — Telehealth: Payer: Self-pay | Admitting: Cardiology

## 2020-02-24 ENCOUNTER — Telehealth: Payer: Self-pay | Admitting: *Deleted

## 2020-02-24 ENCOUNTER — Ambulatory Visit: Payer: Medicaid Other | Admitting: Cardiology

## 2020-02-24 DIAGNOSIS — I513 Intracardiac thrombosis, not elsewhere classified: Secondary | ICD-10-CM

## 2020-02-24 DIAGNOSIS — I5022 Chronic systolic (congestive) heart failure: Secondary | ICD-10-CM

## 2020-02-24 DIAGNOSIS — I10 Essential (primary) hypertension: Secondary | ICD-10-CM

## 2020-02-24 NOTE — Telephone Encounter (Signed)
Client did not show up at shelter. Called transportation and canceled ride.

## 2020-02-24 NOTE — Telephone Encounter (Signed)
Spoke with patient. Patient reports he has increased swelling to his legs, face and eyes. He says he wakes up every morning with his eyes swollen. Unable to assess weight gain or edema over the phone. Patient agitated. He reports he has been taking his lasix. Per background conversation patient was instructed by male to take "pee pills" daily. Reviewed medications with pharm D, Kristin Alvstad, and determined no medications should be causing facial swelling and this appears to be heart failure related. Belenda Cruise suggested APP appointment at either office based on availability. Patient missed appointment on 02/24/2020 but confirms he will be at appointment on 02/28/2020.

## 2020-02-24 NOTE — Telephone Encounter (Signed)
Patient called and stated that the medication he is taking is making him swell up and is having problems taking them. Please call to confirm.

## 2020-02-26 ENCOUNTER — Encounter: Payer: Self-pay | Admitting: *Deleted

## 2020-02-26 ENCOUNTER — Telehealth: Payer: Self-pay | Admitting: *Deleted

## 2020-02-26 NOTE — Telephone Encounter (Signed)
[facility] to inquire about an appt. They do not take medicaid with VA insurance.

## 2020-02-26 NOTE — Telephone Encounter (Signed)
Called to verify time and location for 11/19 appt. Per call [facility] at 3:30.

## 2020-02-26 NOTE — Congregational Nurse Program (Signed)
  Dept: [phone]   Congregational Nurse Program Note  Date of Encounter: 02/26/2020  Past Medical History: Past Medical History:  Diagnosis Date  . Acid reflux   . Arthritis   . Asthma   . Back pain   . Bronchitis   . COPD (chronic obstructive pulmonary disease) (HCC)   . Hypertension   . Irregular heart beat   . LV (left ventricular) mural thrombus   . Myocardial infarction (HCC)   . Neuropathic pain   . NICM (nonischemic cardiomyopathy) (HCC) 2019  . RVF (right ventricular failure) (HCC)   . Schizo affective schizophrenia [facility])     Encounter Details:  CNP Questionnaire - 02/26/20 1126      Questionnaire   Do you give verbal consent to treat you today? Yes    Visit Setting Church or Organization    Location Patient Served At [facility]    Patient Status Homeless    Medical Provider Yes    Insurance Medicaid;VA Insurance    Intervention Support    Housing/Utilities No permanent housing    Transportation Need transportation assistance          Spoke with client in [facility] lobby. He is requesting help with transportation to a cardiologist appt on Friday Nov 19th. Per epic appt is at 3:30. Gave two bus passes. Jennavieve Arrick W RN CN [phone]

## 2020-02-26 NOTE — Telephone Encounter (Signed)
Follow up  LVM for patient to call back and reschedule his 12/16 appt with Dr Antoine Poche for Friday 11/19 at 3:20pm in the DOD slot. Approved by Regis Bill

## 2020-02-27 NOTE — Progress Notes (Deleted)
Cardiology Office Note   Date:  02/27/2020   ID:  [name], DOB [birth year], MRN [id number]  PCP:  [facility], [facility]  Cardiologist:   Rollene Rotunda, MD   No chief complaint on file.     History of Present Illness: Allen Mayer is a 59 y.o. male who presents for follow up of acute systolic HF and LV thrombus.  He was in the hospital with this in July.  Since then he has been in the ED three times. Twice in October he required some IV Lasix and was discharged in Nov he had peripheral edema because he was without a home and her meds and had not had any Lasix.   I reviewed these records.  Of note he had prior cardiac catheterization in 2019 that demonstrated no coronary artery disease.  He has a nonischemic cardiomyopathy  He has a history of no showing for appts including warfarin follow up.  ***    *** He has been a no show for coumadin follow up.  Marland Kitchen  He did have runs of nonsustained ventricular tachycardia in the hospital.  Of note he has a history of cocaine use with previous positive tox screen recently.  We are cautiously using carvedilol rather than metoprolol.  J  Overall I think he is doing relatively well.  He is not short of breath as he was when he came to the hospital although he still does have some dyspnea with exertion.  He says he has had some mild nosebleeds.  He has not noticed any GI bleeding.  I think he has been compliant with his cardiac medications.  He has a cough productive of some white phlegm occasionally but has not had any fevers or chills.  He has had no new PND or orthopnea.  He said no new lower extremity swelling.   Past Medical History:  Diagnosis Date  . Acid reflux   . Arthritis   . Asthma   . Back pain   . Bronchitis   . COPD (chronic obstructive pulmonary disease) (HCC)   . Hypertension   . Irregular heart beat   . LV (left ventricular) mural thrombus   . Myocardial infarction (HCC)   . Neuropathic pain   .  NICM (nonischemic cardiomyopathy) (HCC) 2019  . RVF (right ventricular failure) (HCC)   . Schizo affective schizophrenia [facility])     Past Surgical History:  Procedure Laterality Date  . None    . RIGHT/LEFT HEART CATH AND CORONARY ANGIOGRAPHY N/A 12/13/2017   Procedure: RIGHT/LEFT HEART CATH AND CORONARY ANGIOGRAPHY;  Surgeon: Runell Gess, MD;  Location: [facility] INVASIVE CV [facility];  Service: Cardiovascular;  Laterality: N/A;     Current Outpatient Medications  Medication Sig Dispense Refill  . albuterol (VENTOLIN HFA) 108 (90 Base) MCG/ACT inhaler Inhale 2 puffs into the lungs every 4 (four) hours as needed for wheezing or shortness of breath. 18 g 0  . baclofen (LIORESAL) 10 MG tablet Take 10 mg by mouth 3 (three) times daily as needed for muscle spasms. LF 10/09/19 30DS    . benztropine (COGENTIN) 1 MG tablet Take 1 tablet (1 mg total) by mouth at bedtime. 30 tablet 0  . carvedilol (COREG) 6.25 MG tablet Take 1 tablet (6.25 mg total) by mouth 2 (two) times daily. 180 tablet 3  . citalopram (CELEXA) 20 MG tablet Take 1 tablet (20 mg total) by mouth daily. 30 tablet 0  . FLOVENT HFA 110 MCG/ACT inhaler Inhale  2 puffs into the lungs 2 (two) times daily. 1 each 1  . furosemide (LASIX) 40 MG tablet Take 1 tablet (40 mg total) by mouth daily. 90 tablet 3  . gabapentin (NEURONTIN) 400 MG capsule Take 1 capsule (400 mg total) by mouth daily as needed (nerve pain). 30 capsule 0  . losartan (COZAAR) 50 MG tablet Take 1 tablet (50 mg total) by mouth daily. 90 tablet 3  . nitroGLYCERIN (NITROSTAT) 0.4 MG SL tablet Place 1 tablet (0.4 mg total) under the tongue every 5 (five) minutes as needed for chest pain. 30 tablet 3  . oxyCODONE (OXY IR/ROXICODONE) 5 MG immediate release tablet Take 1 tablet (5 mg total) by mouth 3 (three) times daily as needed (pain). (Patient taking differently: Take 10 mg by mouth 3 (three) times daily as needed (pain). ) 4 tablet 0  . paliperidone (INVEGA) 6 MG 24 hr tablet Take 1  tablet (6 mg total) by mouth daily. 30 tablet 0  . pantoprazole (PROTONIX) 20 MG tablet Take 1 tablet (20 mg total) by mouth daily before breakfast. 90 tablet 3  . warfarin (COUMADIN) 2.5 MG tablet Take 3 tablets (7.5 mg total) by mouth daily. 90 tablet 0   No current facility-administered medications for this visit.    Allergies:   Patient has no known allergies.    ROS:  Please see the history of present illness.   Otherwise, review of systems are positive for ***.   All other systems are reviewed and negative.    PHYSICAL EXAM: VS:  There were no vitals taken for this visit. , BMI There is no height or weight on file to calculate BMI. GENERAL:  Well appearing NECK:  No jugular venous distention, waveform within normal limits, carotid upstroke brisk and symmetric, no bruits, no thyromegaly LUNGS:  Clear to auscultation bilaterally CHEST:  Unremarkable HEART:  PMI not displaced or sustained,S1 and S2 within normal limits, no S3, no S4, no clicks, no rubs, *** murmurs ABD:  Flat, positive bowel sounds normal in frequency in pitch, no bruits, no rebound, no guarding, no midline pulsatile mass, no hepatomegaly, no splenomegaly EXT:  2 plus pulses throughout, no edema, no cyanosis no clubbing    ***GENERAL:  Well appearing NECK:  No jugular venous distention, waveform within normal limits, carotid upstroke brisk and symmetric, no bruits, no thyromegaly LUNGS:  Clear to auscultation bilaterally CHEST:  Unremarkable HEART:  PMI not displaced or sustained,S1 and S2 within normal limits, no S3, no S4, no clicks, no rubs, no murmurs ABD:  Flat, positive bowel sounds normal in frequency in pitch, no bruits, no rebound, no guarding, no midline pulsatile mass, no hepatomegaly, no splenomegaly EXT:  2 plus pulses throughout, no edema, no cyanosis no clubbing   EKG:  EKG is *** ordered today. ***   Recent Labs: 11/02/2019: Magnesium 2.0 02/09/2020: ALT 43; B Natriuretic Peptide 2,243.3; BUN  26; Creatinine, Ser 1.74; Hemoglobin 15.1; Platelets 186; Potassium 4.5; Sodium 139    Lipid Panel    Component Value Date/Time   CHOL 126 10/28/2019 0712   TRIG 57 10/28/2019 0712   HDL 32 (L) 10/28/2019 0712   CHOLHDL 3.9 10/28/2019 0712   VLDL 11 10/28/2019 0712   LDLCALC 83 10/28/2019 0712      Wt Readings from Last 3 Encounters:  02/11/20 215 lb (97.5 kg)  02/09/20 215 lb (97.5 kg)  01/26/20 209 lb 7 oz (95 kg)      Other studies Reviewed: Additional studies/ records  that were reviewed today include: ***. Review of the above records demonstrates:  Please see elsewhere in the note.     ASSESSMENT AND PLAN:   Acute on chronic combined systolic and diastolic CHF *** He does have a cough that does not seem to be overtly volume overloaded.  I am going to check a BNP level.  I am going to titrate his medications by increasing his carvedilol to 6.25 mg twice a day and increase his Cozaar to 50 mg daily.   LV thrombus He had a follow up echo in July with chronic apical thrombus.  *** I discussed with him LV thrombus and is getting his Coumadin checked today.  He will continue with anticoagulation probably for about 3 months with a follow-up echo.  tient follow-up recommendations as stated above.  Sinus tachycardia bradyarrhythmia. *** I think he will tolerate the change in medications and will watch his heart rate.   COPD This is probably the cause of his moderately elevated pulmonary pressures.  *** He can continue with as needed bronchodilators.   Essential hypertension ***   This is being managed in the context of treating his CHF  Polysubstance abuse ***  He says he is not using drugs.  We talked about cigarettes as below.  Tobacco abuse.   ***  He has been educated about the need to stop smoking completely.  Chronic pain syndrome ***  er his primary team.   Current medicines are reviewed at length with the patient today.  The patient does not have concerns  regarding medicines.  The following changes have been made:  ***  Labs/ tests ordered today include: ***  No orders of the defined types were placed in this encounter.    Disposition:   FU with ***   Signed, Rollene Rotunda, MD  02/27/2020 5:58 PM    [facility]

## 2020-02-28 ENCOUNTER — Inpatient Hospital Stay (HOSPITAL_COMMUNITY)
Admission: EM | Admit: 2020-02-28 | Discharge: 2020-03-03 | DRG: 291 | Disposition: A | Discharge status: Home / Self Care | Payer: Medicaid Other | Attending: Internal Medicine | Admitting: Internal Medicine

## 2020-02-28 ENCOUNTER — Other Ambulatory Visit: Payer: Self-pay

## 2020-02-28 ENCOUNTER — Ambulatory Visit: Payer: Medicaid Other | Admitting: Cardiology

## 2020-02-28 ENCOUNTER — Ambulatory Visit: Payer: Medicaid Other | Admitting: Physician Assistant

## 2020-02-28 ENCOUNTER — Emergency Department (HOSPITAL_COMMUNITY): Payer: Medicaid Other

## 2020-02-28 ENCOUNTER — Encounter (HOSPITAL_COMMUNITY): Payer: Self-pay | Admitting: Emergency Medicine

## 2020-02-28 DIAGNOSIS — F259 Schizoaffective disorder, unspecified: Secondary | ICD-10-CM | POA: Diagnosis present

## 2020-02-28 DIAGNOSIS — I495 Sick sinus syndrome: Secondary | ICD-10-CM

## 2020-02-28 DIAGNOSIS — R002 Palpitations: Secondary | ICD-10-CM | POA: Diagnosis present

## 2020-02-28 DIAGNOSIS — R0602 Shortness of breath: Secondary | ICD-10-CM | POA: Diagnosis not present

## 2020-02-28 DIAGNOSIS — I5082 Biventricular heart failure: Secondary | ICD-10-CM | POA: Diagnosis present

## 2020-02-28 DIAGNOSIS — I130 Hypertensive heart and chronic kidney disease with heart failure and stage 1 through stage 4 chronic kidney disease, or unspecified chronic kidney disease: Principal | ICD-10-CM | POA: Diagnosis present

## 2020-02-28 DIAGNOSIS — G629 Polyneuropathy, unspecified: Secondary | ICD-10-CM | POA: Diagnosis present

## 2020-02-28 DIAGNOSIS — I313 Pericardial effusion (noninflammatory): Secondary | ICD-10-CM | POA: Diagnosis present

## 2020-02-28 DIAGNOSIS — K219 Gastro-esophageal reflux disease without esophagitis: Secondary | ICD-10-CM | POA: Diagnosis present

## 2020-02-28 DIAGNOSIS — F1010 Alcohol abuse, uncomplicated: Secondary | ICD-10-CM | POA: Diagnosis present

## 2020-02-28 DIAGNOSIS — J449 Chronic obstructive pulmonary disease, unspecified: Secondary | ICD-10-CM | POA: Diagnosis present

## 2020-02-28 DIAGNOSIS — T501X5A Adverse effect of loop [high-ceiling] diuretics, initial encounter: Secondary | ICD-10-CM | POA: Diagnosis not present

## 2020-02-28 DIAGNOSIS — M1990 Unspecified osteoarthritis, unspecified site: Secondary | ICD-10-CM | POA: Diagnosis present

## 2020-02-28 DIAGNOSIS — F3189 Other bipolar disorder: Secondary | ICD-10-CM | POA: Diagnosis not present

## 2020-02-28 DIAGNOSIS — Z5900 Homelessness unspecified: Secondary | ICD-10-CM | POA: Diagnosis not present

## 2020-02-28 DIAGNOSIS — R001 Bradycardia, unspecified: Secondary | ICD-10-CM | POA: Diagnosis not present

## 2020-02-28 DIAGNOSIS — Z20822 Contact with and (suspected) exposure to covid-19: Secondary | ICD-10-CM | POA: Diagnosis present

## 2020-02-28 DIAGNOSIS — I5042 Chronic combined systolic (congestive) and diastolic (congestive) heart failure: Secondary | ICD-10-CM | POA: Diagnosis present

## 2020-02-28 DIAGNOSIS — I5021 Acute systolic (congestive) heart failure: Secondary | ICD-10-CM

## 2020-02-28 DIAGNOSIS — Z720 Tobacco use: Secondary | ICD-10-CM

## 2020-02-28 DIAGNOSIS — T783XXA Angioneurotic edema, initial encounter: Secondary | ICD-10-CM | POA: Diagnosis not present

## 2020-02-28 DIAGNOSIS — F319 Bipolar disorder, unspecified: Secondary | ICD-10-CM | POA: Diagnosis present

## 2020-02-28 DIAGNOSIS — Z79899 Other long term (current) drug therapy: Secondary | ICD-10-CM | POA: Diagnosis not present

## 2020-02-28 DIAGNOSIS — R059 Cough, unspecified: Secondary | ICD-10-CM | POA: Diagnosis present

## 2020-02-28 DIAGNOSIS — Z7901 Long term (current) use of anticoagulants: Secondary | ICD-10-CM

## 2020-02-28 DIAGNOSIS — R221 Localized swelling, mass and lump, neck: Secondary | ICD-10-CM | POA: Diagnosis not present

## 2020-02-28 DIAGNOSIS — I513 Intracardiac thrombosis, not elsewhere classified: Secondary | ICD-10-CM

## 2020-02-28 DIAGNOSIS — F1990 Other psychoactive substance use, unspecified, uncomplicated: Secondary | ICD-10-CM | POA: Diagnosis present

## 2020-02-28 DIAGNOSIS — F1410 Cocaine abuse, uncomplicated: Secondary | ICD-10-CM | POA: Diagnosis present

## 2020-02-28 DIAGNOSIS — Z888 Allergy status to other drugs, medicaments and biological substances status: Secondary | ICD-10-CM

## 2020-02-28 DIAGNOSIS — R791 Abnormal coagulation profile: Secondary | ICD-10-CM | POA: Diagnosis present

## 2020-02-28 DIAGNOSIS — I472 Ventricular tachycardia: Secondary | ICD-10-CM | POA: Diagnosis present

## 2020-02-28 DIAGNOSIS — I420 Dilated cardiomyopathy: Secondary | ICD-10-CM | POA: Diagnosis present

## 2020-02-28 DIAGNOSIS — I252 Old myocardial infarction: Secondary | ICD-10-CM

## 2020-02-28 DIAGNOSIS — Z8249 Family history of ischemic heart disease and other diseases of the circulatory system: Secondary | ICD-10-CM | POA: Diagnosis not present

## 2020-02-28 DIAGNOSIS — I5043 Acute on chronic combined systolic (congestive) and diastolic (congestive) heart failure: Secondary | ICD-10-CM

## 2020-02-28 DIAGNOSIS — I5041 Acute combined systolic (congestive) and diastolic (congestive) heart failure: Secondary | ICD-10-CM | POA: Diagnosis not present

## 2020-02-28 DIAGNOSIS — I509 Heart failure, unspecified: Secondary | ICD-10-CM

## 2020-02-28 DIAGNOSIS — F1110 Opioid abuse, uncomplicated: Secondary | ICD-10-CM | POA: Diagnosis not present

## 2020-02-28 DIAGNOSIS — I10 Essential (primary) hypertension: Secondary | ICD-10-CM | POA: Diagnosis present

## 2020-02-28 DIAGNOSIS — N1831 Chronic kidney disease, stage 3a: Secondary | ICD-10-CM | POA: Diagnosis present

## 2020-02-28 DIAGNOSIS — Z9114 Patient's other noncompliance with medication regimen: Secondary | ICD-10-CM

## 2020-02-28 DIAGNOSIS — R079 Chest pain, unspecified: Secondary | ICD-10-CM

## 2020-02-28 DIAGNOSIS — I5023 Acute on chronic systolic (congestive) heart failure: Secondary | ICD-10-CM | POA: Diagnosis not present

## 2020-02-28 DIAGNOSIS — F17210 Nicotine dependence, cigarettes, uncomplicated: Secondary | ICD-10-CM | POA: Diagnosis present

## 2020-02-28 DIAGNOSIS — R109 Unspecified abdominal pain: Secondary | ICD-10-CM | POA: Diagnosis present

## 2020-02-28 HISTORY — DX: Other ventricular tachycardia: I47.29

## 2020-02-28 HISTORY — DX: Other pericardial effusion (noninflammatory): I31.39

## 2020-02-28 HISTORY — DX: Alcohol abuse, uncomplicated: F10.10

## 2020-02-28 HISTORY — DX: Pulmonary hypertension, unspecified: I27.20

## 2020-02-28 HISTORY — DX: Ventricular tachycardia: I47.2

## 2020-02-28 HISTORY — DX: Patient's noncompliance with other medical treatment and regimen due to unspecified reason: Z91.199

## 2020-02-28 HISTORY — DX: Cocaine abuse, uncomplicated: F14.10

## 2020-02-28 HISTORY — DX: Pericardial effusion (noninflammatory): I31.3

## 2020-02-28 HISTORY — DX: Chronic kidney disease, stage 3 unspecified: N18.30

## 2020-02-28 HISTORY — DX: Homelessness unspecified: Z59.00

## 2020-02-28 HISTORY — DX: Patient's noncompliance with other medical treatment and regimen: Z91.19

## 2020-02-28 LAB — URINALYSIS, ROUTINE W REFLEX MICROSCOPIC
Bacteria, UA: NONE SEEN
Bilirubin Urine: NEGATIVE
Glucose, UA: NEGATIVE mg/dL
Ketones, ur: NEGATIVE mg/dL
Leukocytes,Ua: NEGATIVE
Nitrite: NEGATIVE
Protein, ur: NEGATIVE mg/dL
Specific Gravity, Urine: 1.004 — ABNORMAL LOW (ref 1.005–1.030)
pH: 7 (ref 5.0–8.0)

## 2020-02-28 LAB — PROTIME-INR
INR: 1.2 (ref 0.8–1.2)
Prothrombin Time: 14.7 seconds (ref 11.4–15.2)

## 2020-02-28 LAB — CBC
HCT: 47.3 % (ref 39.0–52.0)
HCT: 48 % (ref 39.0–52.0)
Hemoglobin: 14.9 g/dL (ref 13.0–17.0)
Hemoglobin: 15 g/dL (ref 13.0–17.0)
MCH: 29.1 pg (ref 26.0–34.0)
MCH: 28.7 pg (ref 26.0–34.0)
MCHC: 31.5 g/dL (ref 30.0–36.0)
MCHC: 31.3 g/dL (ref 30.0–36.0)
MCV: 92.4 fL (ref 80.0–100.0)
MCV: 92 fL (ref 80.0–100.0)
Platelets: 201 10*3/uL (ref 150–400)
Platelets: 193 10*3/uL (ref 150–400)
RBC: 5.12 MIL/uL (ref 4.22–5.81)
RBC: 5.22 MIL/uL (ref 4.22–5.81)
RDW: 15.8 % — ABNORMAL HIGH (ref 11.5–15.5)
RDW: 15.9 % — ABNORMAL HIGH (ref 11.5–15.5)
WBC: 6.3 10*3/uL (ref 4.0–10.5)
WBC: 5.3 10*3/uL (ref 4.0–10.5)
nRBC: 0 % (ref 0.0–0.2)
nRBC: 0 % (ref 0.0–0.2)

## 2020-02-28 LAB — RESPIRATORY PANEL BY RT PCR (FLU A&B, COVID)
Influenza A by PCR: NEGATIVE
Influenza B by PCR: NEGATIVE
SARS Coronavirus 2 by RT PCR: NEGATIVE

## 2020-02-28 LAB — BASIC METABOLIC PANEL
Anion gap: 10 (ref 5–15)
BUN: 19 mg/dL (ref 8–23)
CO2: 25 mmol/L (ref 22–32)
Calcium: 9.4 mg/dL (ref 8.9–10.3)
Chloride: 102 mmol/L (ref 98–111)
Creatinine, Ser: 1.75 mg/dL — ABNORMAL HIGH (ref 0.61–1.24)
GFR, Estimated: 44 mL/min — ABNORMAL LOW (ref >60)
Glucose, Bld: 103 mg/dL — ABNORMAL HIGH (ref 70–99)
Potassium: 4.9 mmol/L (ref 3.5–5.1)
Sodium: 137 mmol/L (ref 135–145)

## 2020-02-28 LAB — RAPID URINE DRUG SCREEN, HOSP PERFORMED
Amphetamines: NOT DETECTED
Barbiturates: NOT DETECTED
Benzodiazepines: NOT DETECTED
Cocaine: POSITIVE — AB
Opiates: NOT DETECTED
Tetrahydrocannabinol: NOT DETECTED

## 2020-02-28 LAB — TROPONIN I (HIGH SENSITIVITY)
Troponin I (High Sensitivity): 109 ng/L (ref <18)
Troponin I (High Sensitivity): 96 ng/L — ABNORMAL HIGH (ref <18)

## 2020-02-28 LAB — CREATININE, SERUM
Creatinine, Ser: 1.62 mg/dL — ABNORMAL HIGH (ref 0.61–1.24)
GFR, Estimated: 48 mL/min — ABNORMAL LOW (ref >60)

## 2020-02-28 LAB — BRAIN NATRIURETIC PEPTIDE: B Natriuretic Peptide: 2265.7 pg/mL — ABNORMAL HIGH (ref 0.0–100.0)

## 2020-02-28 MED ORDER — FLUTICASONE PROPIONATE HFA 110 MCG/ACT IN AERO: 2.0000 | INHALATION_SPRAY | Freq: Two times a day (BID) | RESPIRATORY_TRACT | Status: DC

## 2020-02-28 MED ORDER — MORPHINE SULFATE (PF) 4 MG/ML IV SOLN: 4.0000 mg | Freq: Once | INTRAVENOUS | Status: DC | PRN

## 2020-02-28 MED ORDER — ENOXAPARIN SODIUM 40 MG/0.4ML ~~LOC~~ SOLN
40.0000 mg | SUBCUTANEOUS | Status: DC
  Administered 2020-02-29 – 2020-03-02 (×3): 40 mg via SUBCUTANEOUS
  Filled 2020-02-28 (×3): qty 0.4

## 2020-02-28 MED ORDER — ISOSORBIDE MONONITRATE ER 30 MG PO TB24
15.0000 mg | ORAL_TABLET | Freq: Every day | ORAL | Status: DC
  Administered 2020-02-29 – 2020-03-03 (×4): 15 mg via ORAL
  Filled 2020-02-28 (×4): qty 1

## 2020-02-28 MED ORDER — FUROSEMIDE 20 MG PO TABS: 40.0000 mg | ORAL_TABLET | Freq: Every day | ORAL | Status: DC

## 2020-02-28 MED ORDER — SODIUM CHLORIDE 0.9% FLUSH: 3.0000 mL | INTRAVENOUS | Status: DC | PRN

## 2020-02-28 MED ORDER — ACETAMINOPHEN 325 MG PO TABS
650.0000 mg | ORAL_TABLET | ORAL | Status: DC | PRN
  Administered 2020-02-28: 650 mg via ORAL
  Filled 2020-02-28 (×3): qty 2

## 2020-02-28 MED ORDER — SODIUM CHLORIDE 0.9% FLUSH
3.0000 mL | Freq: Two times a day (BID) | INTRAVENOUS | Status: DC
  Administered 2020-02-28 – 2020-03-03 (×7): 3 mL via INTRAVENOUS

## 2020-02-28 MED ORDER — CARVEDILOL 12.5 MG PO TABS: 6.2500 mg | ORAL_TABLET | Freq: Two times a day (BID) | ORAL | Status: DC

## 2020-02-28 MED ORDER — BUDESONIDE 0.25 MG/2ML IN SUSP
0.2500 mg | Freq: Two times a day (BID) | RESPIRATORY_TRACT | Status: DC
  Administered 2020-02-28 – 2020-03-03 (×6): 0.25 mg via RESPIRATORY_TRACT
  Filled 2020-02-28 (×8): qty 2

## 2020-02-28 MED ORDER — GABAPENTIN 300 MG PO CAPS: 400.0000 mg | ORAL_CAPSULE | Freq: Every day | ORAL | Status: DC | PRN

## 2020-02-28 MED ORDER — ARIPIPRAZOLE 10 MG PO TABS
10.0000 mg | ORAL_TABLET | Freq: Two times a day (BID) | ORAL | Status: DC
  Administered 2020-02-28 – 2020-03-03 (×8): 10 mg via ORAL
  Filled 2020-02-28: qty 1
  Filled 2020-02-28 (×2): qty 2
  Filled 2020-02-28 (×2): qty 1
  Filled 2020-02-28: qty 2
  Filled 2020-02-28: qty 1
  Filled 2020-02-28: qty 2
  Filled 2020-02-28 (×2): qty 1
  Filled 2020-02-28 (×2): qty 2
  Filled 2020-02-28 (×3): qty 1

## 2020-02-28 MED ORDER — FUROSEMIDE 10 MG/ML IJ SOLN: 40.0000 mg | Freq: Two times a day (BID) | INTRAMUSCULAR | Status: DC

## 2020-02-28 MED ORDER — CITALOPRAM HYDROBROMIDE 20 MG PO TABS
20.0000 mg | ORAL_TABLET | Freq: Every day | ORAL | Status: DC
  Administered 2020-02-29 – 2020-03-03 (×4): 20 mg via ORAL
  Filled 2020-02-28 (×4): qty 1

## 2020-02-28 MED ORDER — HYDRALAZINE HCL 25 MG PO TABS
25.0000 mg | ORAL_TABLET | Freq: Three times a day (TID) | ORAL | Status: DC
  Administered 2020-02-28 – 2020-03-03 (×12): 25 mg via ORAL
  Filled 2020-02-28 (×12): qty 1

## 2020-02-28 MED ORDER — ALBUTEROL SULFATE HFA 108 (90 BASE) MCG/ACT IN AERS
2.0000 | INHALATION_SPRAY | RESPIRATORY_TRACT | Status: DC | PRN
  Filled 2020-02-28 (×2): qty 6.7

## 2020-02-28 MED ORDER — SODIUM CHLORIDE 0.9 % IV SOLN: 250.0000 mL | INTRAVENOUS | Status: DC | PRN

## 2020-02-28 MED ORDER — FUROSEMIDE 10 MG/ML IJ SOLN
40.0000 mg | Freq: Once | INTRAMUSCULAR | Status: AC
  Administered 2020-02-28: 40 mg via INTRAVENOUS
  Filled 2020-02-28: qty 4

## 2020-02-28 NOTE — Progress Notes (Signed)
Admitted to unit with spouse in room.  Asked immediately for something to eat for both of them.  Snacks provided.  Vital signs stable, telemetry monitor applied.  C/O pain 8/10 on pain scale that he describes as "all over" from other medical conditions.  Assisted with repositioning in bed, denies other need at this time.

## 2020-02-28 NOTE — ED Notes (Signed)
Patient and significant other provided with graham crackers, saltines, coffee and soda. Patient informed that he would receive a dinner tray.

## 2020-02-28 NOTE — ED Provider Notes (Signed)
[facility] EMERGENCY DEPARTMENT Provider Note   CSN: [id number] Arrival date & time: 02/28/20  4098     History Chief Complaint  Patient presents with  . Chest Pain    Allen Mayer is a 59 y.o. male w/ hx of nonischemic cardiomyopathy (EF <20% in July 2021 echo) w/ chronic LV thrombus, on coumadin, COPD, medical noncompliance, presenting to the ED with chest pain.  He reports he has gradual onset left sided chest pressure today while riding the bus, that radiates to his left shoulder with left hand numbness.  It is similar to prior episodes of chest pain he's had.  It is 9/10 intensity now.  Nothing makes it better or worse.  He took 1 SL nitro with no relief.  He also reports bilateral leg swelling and abdominal swelling and SOB for several days.    He tells me that he is not taking his medications at home because "they make me sick."  He states he was told to STOP taking coumadin because his INR was 3.8.  He reports he hasn't taken lasix in "a few days" but cannot clarify why.   He was scheduled for an appointment with Dr Antoine Poche from cardiology on 02/24/20 but did not show up to appointment.  He has another appointment scheduled for this afternoon at 2 pm.  From medical chart review, the patient's last LHC on 12/13/2017, per Dr Andree Coss was noted to have "clean coronary arteries."  Last echo was 10/28/19, impression as noted below:  1. There appears to be likely chronic LV thrombus at the apex. Patient  without IV access, so contrast not given. . Left ventricular ejection  fraction, by estimation, is <20%. The left ventricle has severely  decreased function. The left ventricle  demonstrates global hypokinesis. The left ventricular internal cavity size  was severely dilated. Left ventricular diastolic parameters are consistent  with Grade III diastolic dysfunction (restrictive). Elevated left  ventricular end-diastolic pressure.  2. Right ventricular  systolic function is moderately reduced. The right  ventricular size is moderately enlarged. There is moderately elevated  pulmonary artery systolic pressure.  3. Left atrial size was mild to moderately dilated.  4. Right atrial size was moderately dilated.  5. Small circumferential pericardial effusion, bordering on moderate size  posterior to RV. No echo evidence of tamponade.. The pericardial effusion  is circumferential. There is no evidence of cardiac tamponade.  6. The mitral valve is normal in structure. Mild to moderate mitral valve  regurgitation. No evidence of mitral stenosis.  7. Tricuspid valve regurgitation is moderate.  8. The aortic valve is tricuspid. Aortic valve regurgitation is not  visualized.  9. The inferior vena cava is dilated in size with <50% respiratory  variability, suggesting right atrial pressure of 15 mmHg.   HPI  HPI: A 59 year old patient with a history of hypertension presents for evaluation of chest pain. Initial onset of pain was approximately 3-6 hours ago. The patient's chest pain is not worse with exertion. The patient's chest pain is middle- or left-sided, is not well-localized, is not described as heaviness/pressure/tightness, is not sharp and does not radiate to the arms/jaw/neck. The patient does not complain of nausea and denies diaphoresis. The patient has smoked in the past 90 days. The patient has no history of stroke, has no history of peripheral artery disease, denies any history of treated diabetes, has no relevant family history of coronary artery disease (first degree relative at less than age 62), has no history  of hypercholesterolemia and does not have an elevated BMI (>=30).   Past Medical History:  Diagnosis Date  . Acid reflux   . Alcohol abuse   . Arthritis   . Asthma   . Back pain   . Bronchitis   . CKD (chronic kidney disease), stage III (HCC)   . Cocaine abuse (HCC)   . COPD (chronic obstructive pulmonary disease)  (HCC)   . History of noncompliance with medical treatment, presenting hazards to health   . Homelessness   . Hypertension   . LV (left ventricular) mural thrombus   . Myocardial infarction (HCC)   . Neuropathic pain   . NICM (nonischemic cardiomyopathy) (HCC) 2019  . NSVT (nonsustained ventricular tachycardia) (HCC)   . Pericardial effusion   . Pulmonary hypertension (HCC)   . RVF (right ventricular failure) (HCC)   . Schizo affective schizophrenia [facility])     Patient Active Problem List   Diagnosis Date Noted  . Acute exacerbation of congestive heart failure (HCC) 02/28/2020  . Chronic systolic HF (heart failure) (HCC) 02/23/2020  . Long term (current) use of anticoagulants 11/14/2019  . RUQ pain   . Acute on chronic combined systolic and diastolic CHF (congestive heart failure) (HCC) 10/28/2019  . LV (left ventricular) mural thrombus   . Acute exacerbation of CHF (congestive heart failure) (HCC) 10/27/2019  . Acute respiratory failure with hypoxia (HCC) 10/27/2019  . SOB (shortness of breath)   . Coronary artery calcification seen on CAT scan   . DCM (dilated cardiomyopathy) (HCC)   . Acute CHF (congestive heart failure) (HCC) 12/11/2017  . Elevated troponin 12/11/2017  . COPD (chronic obstructive pulmonary disease) (HCC) 12/11/2017  . 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 Tobacco abuse 11/19/2014  . Essential hypertension, benign 11/19/2014  . Neuropathic pain     Past Surgical History:  Procedure Laterality Date  . None    . RIGHT/LEFT HEART CATH AND CORONARY ANGIOGRAPHY N/A 12/13/2017   Procedure: RIGHT/LEFT HEART CATH AND CORONARY ANGIOGRAPHY;  Surgeon: Runell Gess, MD;  Location: [facility] INVASIVE CV LAB;  Service: Cardiovascular;  Laterality: N/A;       Family History  Problem Relation Age of Onset  . Heart attack Mother        Died age 66  . Heart attack Brother        109    Social History   Tobacco Use   . Smoking status: Current Every Day Smoker    Packs/day: 0.50  . Smokeless tobacco: Never Used  Vaping Use  . Vaping Use: Never used  Substance Use Topics  . Alcohol use: Yes    Comment: occas  . Drug use: Not Currently    Types: Cocaine, Marijuana    Home Medications Prior to Admission medications   Medication Sig Start Date End Date Taking? Authorizing Provider  albuterol (VENTOLIN HFA) 108 (90 Base) MCG/ACT inhaler Inhale 2 puffs into the lungs every 4 (four) hours as needed for wheezing or shortness of breath. 12/04/19  Yes Rollene Rotunda, MD  ARIPiprazole (ABILIFY) 10 MG tablet Take 10 mg by mouth 2 (two) times daily. 01/08/20  Yes [provider]  baclofen (LIORESAL) 10 MG tablet Take 10 mg by mouth 3 (three) times daily as needed for muscle spasms. LF 10/09/19 30DS 10/09/19  Yes [provider]  benztropine (COGENTIN) 1 MG tablet Take 1 tablet (1 mg total) by mouth at bedtime. 06/22/17  Yes Pisciotta, Joni Reining, PA-C  carvedilol (  COREG) 6.25 MG tablet Take 1 tablet (6.25 mg total) by mouth 2 (two) times daily. 12/04/19  Yes Rollene Rotunda, MD  citalopram (CELEXA) 20 MG tablet Take 1 tablet (20 mg total) by mouth daily. 06/22/17  Yes Pisciotta, Mardella Layman  FLOVENT HFA 110 MCG/ACT inhaler Inhale 2 puffs into the lungs 2 (two) times daily. 12/04/19  Yes Rollene Rotunda, MD  furosemide (LASIX) 40 MG tablet Take 1 tablet (40 mg total) by mouth daily. 02/09/20  Yes Sabas Sous, MD  gabapentin (NEURONTIN) 400 MG capsule Take 1 capsule (400 mg total) by mouth daily as needed (nerve pain). 11/02/19  Yes Amin, Loura Halt, MD  lisinopril-hydrochlorothiazide (ZESTORETIC) 20-25 MG tablet Take 1 tablet by mouth daily. 12/26/19  Yes [provider]  losartan (COZAAR) 50 MG tablet Take 1 tablet (50 mg total) by mouth daily. 12/04/19 03/03/20 Yes Rollene Rotunda, MD  nitroGLYCERIN (NITROSTAT) 0.4 MG SL tablet Place 1 tablet (0.4 mg total) under the tongue every 5 (five)  minutes as needed for chest pain. 12/04/19  Yes Rollene Rotunda, MD  oxyCODONE (OXY IR/ROXICODONE) 5 MG immediate release tablet Take 1 tablet (5 mg total) by mouth 3 (three) times daily as needed (pain). Patient taking differently: Take 10 mg by mouth 3 (three) times daily as needed (pain).  11/02/19  Yes Amin, Loura Halt, MD  Vitamin D, Ergocalciferol, (DRISDOL) 1.25 MG (50000 UNIT) CAPS capsule Take 50,000 Units by mouth once a week. 01/24/20  Yes [provider]  warfarin (COUMADIN) 2.5 MG tablet Take 3 tablets (7.5 mg total) by mouth daily. 11/27/19 02/28/20 Yes Rollene Rotunda, MD  paliperidone (INVEGA) 6 MG 24 hr tablet Take 1 tablet (6 mg total) by mouth daily. Patient not taking: Reported on 02/28/2020 06/22/17   Pisciotta, Joni Reining, PA-C  pantoprazole (PROTONIX) 20 MG tablet Take 1 tablet (20 mg total) by mouth daily before breakfast. Patient not taking: Reported on 02/28/2020 12/19/19   Rollene Rotunda, MD    Allergies    Lasix [furosemide]  Review of Systems   Review of Systems  Constitutional: Negative for chills and fever.  HENT: Negative for ear pain and sore throat.   Eyes: Negative for pain and visual disturbance.  Respiratory: Positive for shortness of breath. Negative for cough.   Cardiovascular: Positive for chest pain and leg swelling. Negative for palpitations.  Gastrointestinal: Positive for nausea. Negative for abdominal pain and vomiting.  Genitourinary: Positive for difficulty urinating. Negative for hematuria.  Musculoskeletal: Negative for arthralgias and back pain.  Skin: Negative for color change and rash.  Neurological: Positive for numbness. Negative for syncope.  All other systems reviewed and are negative.   Physical Exam Updated Vital Signs BP (!) 132/96 (BP Location: Right Arm)   Pulse 84   Temp 98.6 F (37 C) (Oral)   Resp 16   Ht 6' (1.829 m)   Wt 97.5 kg   SpO2 98%   BMI 29.15 kg/m   Physical Exam Vitals and nursing note reviewed.   Constitutional:      Appearance: He is well-developed.  HENT:     Head: Normocephalic and atraumatic.  Eyes:     Conjunctiva/sclera: Conjunctivae normal.  Cardiovascular:     Rate and Rhythm: Normal rate and regular rhythm.     Heart sounds: Normal heart sounds.  Pulmonary:     Effort: Pulmonary effort is normal. No respiratory distress.     Breath sounds: Normal breath sounds.  Abdominal:     Palpations: Abdomen is soft.  Tenderness: There is no abdominal tenderness.  Musculoskeletal:     Cervical back: Neck supple.     Right lower leg: Edema present.     Left lower leg: Edema present.  Skin:    General: Skin is warm and dry.  Neurological:     General: No focal deficit present.     Mental Status: He is alert and oriented to person, place, and time.  Psychiatric:        Mood and Affect: Mood normal.        Behavior: Behavior normal.     ED Results / Procedures / Treatments   Labs (all labs ordered are listed, but only abnormal results are displayed) Labs Reviewed  BASIC METABOLIC PANEL - Abnormal; Notable for the following components:      Result Value   Glucose, Bld 103 (*)    Creatinine, Ser 1.75 (*)    GFR, Estimated 44 (*)    All other components within normal limits  CBC - Abnormal; Notable for the following components:   RDW 15.8 (*)    All other components within normal limits  BRAIN NATRIURETIC PEPTIDE - Abnormal; Notable for the following components:   B Natriuretic Peptide 2,265.7 (*)    All other components within normal limits  URINALYSIS, ROUTINE W REFLEX MICROSCOPIC - Abnormal; Notable for the following components:   Color, Urine STRAW (*)    Specific Gravity, Urine 1.004 (*)    Hgb urine dipstick SMALL (*)    All other components within normal limits  RAPID URINE DRUG SCREEN, HOSP PERFORMED - Abnormal; Notable for the following components:   Cocaine POSITIVE (*)    All other components within normal limits  TROPONIN I (HIGH SENSITIVITY) -  Abnormal; Notable for the following components:   Troponin I (High Sensitivity) 109 (*)    All other components within normal limits  TROPONIN I (HIGH SENSITIVITY) - Abnormal; Notable for the following components:   Troponin I (High Sensitivity) 96 (*)    All other components within normal limits  RESPIRATORY PANEL BY RT PCR (FLU A&B, COVID)  PROTIME-INR  CBC  CREATININE, SERUM  BASIC METABOLIC PANEL    EKG EKG Interpretation  Date/Time:  Friday February 28 2020 07:19:52 EST Ventricular Rate:  97 PR Interval:  206 QRS Duration: 78 QT Interval:  366 QTC Calculation: 464 R Axis:   -52 Text Interpretation: Normal sinus rhythm Possible Left atrial enlargement No sig change from prior ecg  Oct 2021 Confirmed by Alvester Chou [phone]) on 02/28/2020 7:27:00 AM Also confirmed by Alvester Chou [phone]), editor Elita Quick [phone])  on 02/28/2020 1:22:40 PM   Radiology DG Chest 2 View  Result Date: 02/28/2020 CLINICAL DATA:  Shortness of breath.  Chest pain.  History of CHF. EXAM: CHEST - 2 VIEW COMPARISON:  None. FINDINGS: Similar enlarged cardiac silhouette. Both lungs are clear. No visible pleural effusions or pneumothorax. The visualized skeletal structures are unremarkable. IMPRESSION: Similar cardiomegaly without acute cardiopulmonary disease. Electronically Signed   By: Feliberto Harts MD   On: 02/28/2020 07:51    Procedures Procedures (including critical care time)  Medications Ordered in ED Medications  morphine 4 MG/ML injection 4 mg (has no administration in time range)  furosemide (LASIX) injection 40 mg (has no administration in time range)  hydrALAZINE (APRESOLINE) tablet 25 mg (has no administration in time range)  isosorbide mononitrate (IMDUR) 24 hr tablet 15 mg (has no administration in time range)  ARIPiprazole (ABILIFY) tablet 10 mg (has no administration  in time range)  citalopram (CELEXA) tablet 20 mg (has no administration in time range)  albuterol  (VENTOLIN HFA) 108 (90 Base) MCG/ACT inhaler 2 puff (has no administration in time range)  fluticasone (FLOVENT HFA) 110 MCG/ACT inhaler 2 puff (has no administration in time range)  sodium chloride flush (NS) 0.9 % injection 3 mL (has no administration in time range)  sodium chloride flush (NS) 0.9 % injection 3 mL (has no administration in time range)  0.9 %  sodium chloride infusion (has no administration in time range)  acetaminophen (TYLENOL) tablet 650 mg (has no administration in time range)  enoxaparin (LOVENOX) injection 40 mg (has no administration in time range)  furosemide (LASIX) injection 40 mg (40 mg Intravenous Given 02/28/20 0809)    ED Course  I have reviewed the triage vital signs and the nursing notes.  Pertinent labs & imaging results that were available during my care of the patient were reviewed by me and considered in my medical decision making (see chart for details).  This patient presents to the Emergency Department with complaint of chest pain. This involves an extensive number of treatment options, and is a complaint that carries with it a high risk of complications and morbidity.  The differential diagnosis includes ACS vs cardiomyopathy vs Pneumothorax vs PE vs Reflux/Gastritis vs MSK pain vs Pneumonia vs other.  He is breathing comfortably and stable on room air on initial exam.   I ordered, reviewed, and interpreted labs, including INR (1.2), subtherapeutic, Cr 1.75 (near recent levels over past month, but gradually has worsneed), BNP 2265 (similar to level 2 weeks ago), CBC unremarkable.  Initial trop 109, 2nd trop 96 I ordered medication IV lasix 40 mg for edema and dyspnea suspected to be 2/2 CHF I ordered imaging studies which included dg chest  I independently visualized and interpreted imaging which showed cardiomegaly but no active pulmonary disease and the monitor tracing which showed NSR Previous records obtained and reviewed showing cardiology office  visits and outpatient w/u as noted above  I personally reviewed the patients ECG which showed sinus rhythm with no acute ischemic findings.  Chronic q waves noted in inferior and anterior leads, which correlate with global hypokinesis noted on echocardiogram, likely from his advanced cardiomyopathy.  I consulted cardiology and discussed lab and imaging findings   Clinical Course as of Feb 27 1706  Fri Feb 28, 2020  0806 INR: 1.2 [MT]  0912 Trop elevated likely demand ischemia from CHF exacerbation and cardiomyopathy.  No STEMI on ECG.  I'll discuss with cardiology   [MT]  0914 I spoke to cardsmaster, consult placed    [MT]  1037 2nd trop 96   [MT]  1217 Awaiting cardiology evaluation   [MT]  1338 Signed out to IM hospitalist - per discussion with cardiology, I agree with plan for hospital admission for some diuresis and medical reconciliation - would recommend considering alternative A/C agent given issues with noncompliance   [MT]    Clinical Course User Index [MT] Osmel Dykstra, Kermit Balo, MD    Final Clinical Impression(s) / ED Diagnoses Final diagnoses:  Chest pain, unspecified type  Subtherapeutic international normalized ratio (INR)    Rx / DC Orders ED Discharge Orders    None       Terald Sleeper, MD 02/28/20 1708

## 2020-02-28 NOTE — H&P (Signed)
NAME:  Allen Mayer, MRN:  [id number], DOB:  1960-09-24, LOS: 0 ADMISSION DATE:  02/28/2020, Primary: Medicine, [facility]  CHIEF COMPLAINT:  Shortness of breath  Medical Service: Internal Medicine Teaching Service         Attending Physician: Dr. Mayford Knife, Dorene Ar, MD    First Contact: Dr. Ephriam Knuckles Pager: [phone]  Second Contact: Dr. Cyndie Chime Pager: [phone]       After Hours (After 5p/  First Contact Pager: [phone]  weekends / holidays): Second Contact Pager: [phone]    History of present illness   59 yo homeless male with COPD, HTN, GERD, and severe HF with reduced EF (<20%) who presented to [facility] on 02/28/20 with chest pain and shortness of breath.   When seen for admission, he was resting comfortably in bed on room air. He was repeatedly requesting food throughout our interview.  He notes progressive LE edema and shortness of breath over the past few weeks. He had a 20 min episode of chest pain this morning that resolved on ED arrival.  He is non-compliant with his medications which he states is because they "all cause him to swell up". He also implies that he can't take his medications because he stays in a shelter sometimes.  He endorses continued cocaine use as recently as the day prior to admission.   Although flank pain was noted by cardiology APP note, pt denied any complaints while we were interviewing him.   He frequents the ED for shortness of breath and/or chest pain quite often. From my discussion with cardiology, it sounds like they along with a congregational nurse have put significant effort into getting him to follow up in their clinic however these efforts have been unsuccessful.   Workup in the ED revealed a BNP of 2265 (similar to 52mo prior), Trop 109>96, UDS + cocaine. CXR was unrevealing.   Past Medical History  He,  has a past medical history of Acid reflux, Alcohol abuse, Arthritis, Asthma, Back pain, Bronchitis, CKD (chronic kidney  disease), stage III (HCC), Cocaine abuse (HCC), COPD (chronic obstructive pulmonary disease) (HCC), History of noncompliance with medical treatment, presenting hazards to health, Homelessness, Hypertension, LV (left ventricular) mural thrombus, Myocardial infarction [facility]), Neuropathic pain, NICM (nonischemic cardiomyopathy) (HCC) (2019), NSVT (nonsustained ventricular tachycardia) (HCC), Pericardial effusion, Pulmonary hypertension (HCC), RVF (right ventricular failure) (HCC), and Schizo affective schizophrenia (HCC).   Home Medications     Prior to Admission medications   Medication Sig Start Date End Date Taking? Authorizing Provider  albuterol (VENTOLIN HFA) 108 (90 Base) MCG/ACT inhaler Inhale 2 puffs into the lungs every 4 (four) hours as needed for wheezing or shortness of breath. 12/04/19  Yes Rollene Rotunda, MD  ARIPiprazole (ABILIFY) 10 MG tablet Take 10 mg by mouth 2 (two) times daily. 01/08/20  Yes [provider]  baclofen (LIORESAL) 10 MG tablet Take 10 mg by mouth 3 (three) times daily as needed for muscle spasms. LF 10/09/19 30DS 10/09/19  Yes [provider]  benztropine (COGENTIN) 1 MG tablet Take 1 tablet (1 mg total) by mouth at bedtime. 06/22/17  Yes Pisciotta, Joni Reining, PA-C  carvedilol (COREG) 6.25 MG tablet Take 1 tablet (6.25 mg total) by mouth 2 (two) times daily. 12/04/19  Yes Rollene Rotunda, MD  citalopram (CELEXA) 20 MG tablet Take 1 tablet (20 mg total) by mouth daily. 06/22/17  Yes Pisciotta, Mardella Layman  FLOVENT HFA 110 MCG/ACT inhaler Inhale 2 puffs into the lungs 2 (two) times daily. 12/04/19  Yes Rollene Rotunda, MD  furosemide (LASIX) 40 MG tablet Take 1 tablet (40 mg total) by mouth daily. 02/09/20  Yes Sabas Sous, MD  gabapentin (NEURONTIN) 400 MG capsule Take 1 capsule (400 mg total) by mouth daily as needed (nerve pain). 11/02/19  Yes Amin, Loura Halt, MD  lisinopril-hydrochlorothiazide (ZESTORETIC) 20-25 MG tablet Take 1 tablet by mouth daily.  12/26/19  Yes [provider]  losartan (COZAAR) 50 MG tablet Take 1 tablet (50 mg total) by mouth daily. 12/04/19 03/03/20 Yes Rollene Rotunda, MD  nitroGLYCERIN (NITROSTAT) 0.4 MG SL tablet Place 1 tablet (0.4 mg total) under the tongue every 5 (five) minutes as needed for chest pain. 12/04/19  Yes Rollene Rotunda, MD  oxyCODONE (OXY IR/ROXICODONE) 5 MG immediate release tablet Take 1 tablet (5 mg total) by mouth 3 (three) times daily as needed (pain). Patient taking differently: Take 10 mg by mouth 3 (three) times daily as needed (pain).  11/02/19  Yes Amin, Loura Halt, MD  Vitamin D, Ergocalciferol, (DRISDOL) 1.25 MG (50000 UNIT) CAPS capsule Take 50,000 Units by mouth once a week. 01/24/20  Yes [provider]  warfarin (COUMADIN) 2.5 MG tablet Take 3 tablets (7.5 mg total) by mouth daily. 11/27/19 02/28/20 Yes Rollene Rotunda, MD  paliperidone (INVEGA) 6 MG 24 hr tablet Take 1 tablet (6 mg total) by mouth daily. Patient not taking: Reported on 02/28/2020 06/22/17   Pisciotta, Joni Reining, PA-C  pantoprazole (PROTONIX) 20 MG tablet Take 1 tablet (20 mg total) by mouth daily before breakfast. Patient not taking: Reported on 02/28/2020 12/19/19   Rollene Rotunda, MD    Allergies    Allergies as of 02/28/2020 - Review Complete 02/28/2020  Allergen Reaction Noted  . Lasix [furosemide] Swelling 02/28/2020    Social History   reports that he has been smoking. He has been smoking about 0.50 packs per day. He has never used smokeless tobacco. He reports current alcohol use. He reports previous drug use. Drugs: Cocaine and Marijuana.   Family History   His family history includes Heart attack in his brother and mother.    ROS  ROS negative unless stated in the HPI  Objective   Blood pressure 112/69, pulse 84, temperature (!) 97.4 F (36.3 C), temperature source Oral, resp. rate (!) 21, height 6' (1.829 m), weight 97.5 kg, SpO2 96 %.    Filed Weights   02/28/20 0756  Weight:  97.5 kg    Examination: GENERAL: resting comfortably in bed HEENT: head atraumatic. CARDIAC: distant heart sounds. Extremities warm. +2 edema extending to the knee PULMONARY: breathing comfortably on room air. Speaks in full sentences.  ABDOMEN: obese  NEURO: a/o.  SKIN: no rash or lesions on limited exam  PSYCH: Normal affect  Significant Diagnostic Tests:  11/19 CXR: stable cardiomegaly without evidence of cardiopulmonary disease  Labs    CBC Latest Ref Rng & Units 02/28/2020 02/09/2020 01/26/2020  WBC 4.0 - 10.5 K/uL 6.3 5.8 5.7  Hemoglobin 13.0 - 17.0 g/dL 19.3 79.0 24.0  Hematocrit 39 - 52 % 47.3 47.2 46.2  Platelets 150 - 400 K/uL 201 186 189   BMP Latest Ref Rng & Units 02/28/2020 02/09/2020 01/26/2020  Glucose 70 - 99 mg/dL 973(Z) 85 329(J)  BUN 8 - 23 mg/dL 19 24(Q) 68(T)  Creatinine 0.61 - 1.24 mg/dL 4.19(Q) 2.22(L) 7.98(X)  BUN/Creat Ratio 10 - 24 - - -  Sodium 135 - 145 mmol/L 137 139 138  Potassium 3.5 - 5.1 mmol/L 4.9 4.5 4.8  Chloride  98 - 111 mmol/L 102 103 103  CO2 22 - 32 mmol/L 25 25 26   Calcium 8.9 - 10.3 mg/dL 9.4 8.9 9.0     Summary  59 yo homeless male with heart failure with NICM (EF<20%, G3DD), LV thrombus, and ongoing cocaine use who presented with shortness of breath and chest pain which resolved in the ED. Cardiology was consulted and requested IMTS to admit while they manage his heart failure exacerbation.  Assessment & Plan:  Active Problems:   Acute exacerbation of congestive heart failure (HCC)  Chest pain (resolved on admission) -No ischemic changes appreciable on EKG. trops 109>96.  -Last left and right heart cath in 2019 showed clean coronary arteries Acute on chronic combined heart failure (EF <20%, G3DD) secondary to medication non-compliance and ongoing cocaine use.   -Difficult to ascertain a dry weight in the setting of poor follow up. Weight up about 5lb from October -per cardiology, not candidate for ICD or life vest in the  setting of non-compliance LV thrombus--pt refusing anticoagulation. Prescribed warfarin however INR 1.2 today, suggesting he has not been taking it. I agree with cardiology that warfarin is not feasible in the setting of poor follow up and non-compliance. Plan: management per cardiology -IV lasix 40mg  BID -hydralazine and imdur  -no ACE/ARB in setting of pt reported "facial swelling" -avoid beta blockade in the setting of ongoing cocaine use -warfarin discontinued as he is not taking it anyways. Can consider resuming if he becomes more aggreeable to [facility] in the future and has better follow up -would recommend palliative care referral to discuss goals of care. Do not think this needs to be done inpatient.  Polysubstance abuse. Homeless. UDS + cocaine. SW consulted. He would benefit from outpatient SW and substance abuse counseling however I am doubtful that he will follow up with this.  CKD 3a. Renal function at baseline. Daily BMP to monitor electrolytes/renal function with diuresis.   COPD. Continue home inhalers.   Best practice:  CODE STATUS: Full Diet: cardiac DVT for prophylaxis: lovenox Dispo: Admit patient to Inpatient with expected length of stay greater than 2 midnights.   , MD Internal Medicine Resident PGY-2 [facility] Internal Medicine Residency Pager: [phone] 02/28/2020 8:52 PM

## 2020-02-28 NOTE — ED Notes (Signed)
Pt requesting a salad and soup. Educated patient the ER only has sandwiches. Pt refusing sandwich.

## 2020-02-28 NOTE — ED Triage Notes (Signed)
Pt reports hx of copd and CHF, states he has chest pain that began this morning as well as some sob with increased edema in bilateral LEs. States his lasix has been giving him issues so he has not been taking it regularly like he should. Took 1 sl nitro without relief. A/ox, resp e/u, nad.

## 2020-02-28 NOTE — ED Notes (Signed)
Assuming care of patient. Pt to xray at this time. Pt able to ambulate with no assistance. Resp even and unlabored. Skin warm and dry. Family at bedside.

## 2020-02-28 NOTE — Progress Notes (Signed)
  ReDS Clip Diuretic Study Pt study # [id number]  Your patient has been enrolled in the ReDS Clip Diuretic Study  Weight on admission 214 lbs (had been 209 lbs in October). BNP elevated 2,265. SCr 1.75. Not taking his PTA lasix regularly at home. Has received one dose of IV lasix 40 mg.   ReDS reading is 33% (goal <35%)  Changes to prescribed diuretics recommended:  Could consider continuing IV lasix if any peripheral edema remains. Otherwise, would resume PO lasix  Provider contacted: Ronie Spies, PA Recommendation was accepted by provider.  REDS Clip  READING= 33%  CHEST RULER = 34 Clip Station = D   Orthodema score = 1 Signs/Symptoms Score   Mild edema, no orthopnea 0 No congestion  Moderate edema, no orthopnea 1 Low-grade orthodema/congestion  Severe edema OR orthopnea 2   Moderate edema and orthopnea 3 High-grade orthodema/congestion  Severe edema AND orthopnea 4    Sharen Hones, PharmD, BCPS Heart Failure Stewardship Pharmacist Phone [phone]  Please check [email or web address] for unit-specific pharmacist phone numbers

## 2020-02-28 NOTE — ED Notes (Signed)
Provided patient with sandwich, ginger ale, coffee, graham crackers, and saltines.

## 2020-02-28 NOTE — Consult Note (Addendum)
Cardiology Consultation:   Patient ID: Allen Mayer MRN: [id number]; DOB: 03-31-1961  Admit date: 02/28/2020 Date of Consult: 02/28/2020  Primary Care Provider: Medicine, [facility] Cardiologist: Rollene Rotunda, MD  [facility] Electrophysiologist:  None    Patient Profile:   Allen Mayer is a 59 y.o. male experiencing homelessness with a history of poor compliance, polysubstance abuse including ongoing cocaine abuse and tobacco use, former alcohol abuse, schizoaffective disorder, NICM and chronic combined CHF, moderate pulmonary HTN, small pericardial effusion by echo 10/2019, LV thrombus (noncompliant with Coumadin), COPD, HTN, NSVT, CKD stage III who is being seen today for the evaluation of chest pain at the request of Dr. Renaye Rakers.  History of Present Illness:   Allen Mayer was diagnosed with NICM in 2019 with cardiac cath at that time with clean coronary arteries. His NICM was felt due to HTN and polysubstance abuse. He did not follow up as suggested. He was in the hospital in 10/2019 with worsening heart failure after running out of his medicines and using cocaine. 2D Echo showed likely chronic LV thrombus, EF <20%, grade 3 DD, moderately reduced RV function with moderate RVE, moderately elevated PASP, mild-mod LAE, moderate RAE, small circumferential pericardial effusion. He had runs of NSVT during that admission and was started on Coumadin for his LV thrombus. He was not felt to be a candidate for Lifevest/ICD due to noncompliance. He followed up in 11/2019 but failed to show for any subsequent follow-ups after that, including for his INR. He's been seen in the ED several times since then for edema in the context of not taking his medicines. He's had numerous appointments scheduled in our clinic for which he did not attend, even with congregational nursing helping to arrange transportation.  He returns to the ED today with his fiance Allen Mayer with  worsening SOB and edema for several weeks. He states he hasn't taken any of his medicines consistently in years because they all swell him up. [place] [address] the [address]. He unfortunately continues to use cocaine and did so yesterday. He has a chronic dry cough which today seems to be causing him more significant left flank pain whenever he coughs today. This morning he had an episode of chest pain which he does not appear interested in providing further details about. It lasted about 20 minutes and resolved spontaneously in the ED. His primary complaint currently is his left flank pain while coughing and being unable to get a salad in the ED because they only have sandwiches. Labs show BNP 2265, Cr 1.75, hsTroponin 109->96 (routinely in the 30s-40s), INR 1.2 suggestive that he has not been taking his Coumadin, UDS + cocaine, SBPs 120s-140s/90s-110. CXR with cardiomegaly and clear lungs. He was given IV Lasix 40mg  with good UOP and improvement in edema. Subsequent ReDS vest reading was normal at 33%.    Past Medical History:  Diagnosis Date  . Acid reflux   . Alcohol abuse   . Arthritis   . Asthma   . Back pain   . Bronchitis   . CKD (chronic kidney disease), stage III (HCC)   . Cocaine abuse (HCC)   . COPD (chronic obstructive pulmonary disease) (HCC)   . History of noncompliance with medical treatment, presenting hazards to health   . Homelessness   . Hypertension   . LV (left ventricular) mural thrombus   . Myocardial infarction (HCC)   . Neuropathic pain   . NICM (nonischemic cardiomyopathy) (HCC) 2019  . NSVT (  nonsustained ventricular tachycardia) (HCC)   . Pericardial effusion   . Pulmonary hypertension (HCC)   . RVF (right ventricular failure) (HCC)   . Schizo affective schizophrenia [facility])     Past Surgical History:  Procedure Laterality Date  . None    . RIGHT/LEFT HEART CATH AND CORONARY ANGIOGRAPHY N/A 12/13/2017   Procedure: RIGHT/LEFT HEART CATH AND CORONARY ANGIOGRAPHY;   Surgeon: Runell Gess, MD;  Location: [facility];  Service: Cardiovascular;  Laterality: N/A;     Home Medications:  Prior to Admission medications   Medication Sig Start Date End Date Taking? Authorizing Provider  albuterol (VENTOLIN HFA) 108 (90 Base) MCG/ACT inhaler Inhale 2 puffs into the lungs every 4 (four) hours as needed for wheezing or shortness of breath. 12/04/19  Yes Rollene Rotunda, MD  ARIPiprazole (ABILIFY) 10 MG tablet Take 10 mg by mouth 2 (two) times daily. 01/08/20  Yes [provider]  baclofen (LIORESAL) 10 MG tablet Take 10 mg by mouth 3 (three) times daily as needed for muscle spasms. LF 10/09/19 30DS 10/09/19  Yes [provider]  benztropine (COGENTIN) 1 MG tablet Take 1 tablet (1 mg total) by mouth at bedtime. 06/22/17  Yes Pisciotta, Joni Reining, PA-C  carvedilol (COREG) 6.25 MG tablet Take 1 tablet (6.25 mg total) by mouth 2 (two) times daily. 12/04/19  Yes Rollene Rotunda, MD  citalopram (CELEXA) 20 MG tablet Take 1 tablet (20 mg total) by mouth daily. 06/22/17  Yes Pisciotta, Mardella Layman  FLOVENT HFA 110 MCG/ACT inhaler Inhale 2 puffs into the lungs 2 (two) times daily. 12/04/19  Yes Rollene Rotunda, MD  furosemide (LASIX) 40 MG tablet Take 1 tablet (40 mg total) by mouth daily. 02/09/20  Yes Sabas Sous, MD  gabapentin (NEURONTIN) 400 MG capsule Take 1 capsule (400 mg total) by mouth daily as needed (nerve pain). 11/02/19  Yes Amin, Loura Halt, MD  lisinopril-hydrochlorothiazide (ZESTORETIC) 20-25 MG tablet Take 1 tablet by mouth daily. 12/26/19  Yes [provider]  losartan (COZAAR) 50 MG tablet Take 1 tablet (50 mg total) by mouth daily. 12/04/19 03/03/20 Yes Rollene Rotunda, MD  nitroGLYCERIN (NITROSTAT) 0.4 MG SL tablet Place 1 tablet (0.4 mg total) under the tongue every 5 (five) minutes as needed for chest pain. 12/04/19  Yes Rollene Rotunda, MD  oxyCODONE (OXY IR/ROXICODONE) 5 MG immediate release tablet Take 1 tablet (5 mg total)  by mouth 3 (three) times daily as needed (pain). Patient taking differently: Take 10 mg by mouth 3 (three) times daily as needed (pain).  11/02/19  Yes Amin, Loura Halt, MD  Vitamin D, Ergocalciferol, (DRISDOL) 1.25 MG (50000 UNIT) CAPS capsule Take 50,000 Units by mouth once a week. 01/24/20  Yes [provider]  warfarin (COUMADIN) 2.5 MG tablet Take 3 tablets (7.5 mg total) by mouth daily. 11/27/19 02/28/20 Yes Rollene Rotunda, MD  paliperidone (INVEGA) 6 MG 24 hr tablet Take 1 tablet (6 mg total) by mouth daily. Patient not taking: Reported on 02/28/2020 06/22/17   Pisciotta, Joni Reining, PA-C  pantoprazole (PROTONIX) 20 MG tablet Take 1 tablet (20 mg total) by mouth daily before breakfast. Patient not taking: Reported on 02/28/2020 12/19/19   Rollene Rotunda, MD    Inpatient Medications: Scheduled Meds:  Continuous Infusions:  PRN Meds: morphine injection  Allergies:    Allergies  Allergen Reactions  . Lasix [Furosemide] Swelling    Face and throat    Social History:   Social History   Socioeconomic History  . Marital status:  Married    Spouse name: Not on file  . Number of children: Not on file  . Years of education: Not on file  . Highest education level: Not on file  Occupational History  . Not on file  Tobacco Use  . Smoking status: Current Every Day Smoker    Packs/day: 0.50  . Smokeless tobacco: Never Used  Vaping Use  . Vaping Use: Never used  Substance and Sexual Activity  . Alcohol use: Yes    Comment: occas  . Drug use: Not Currently    Types: Cocaine, Marijuana  . Sexual activity: Not on file  Other Topics Concern  . Not on file  Social History Narrative   Lives with fiance.     Social Determinants of Health   Financial Resource Strain:   . Difficulty of Paying Living Expenses: Not on file  Food Insecurity:   . Worried About Programme researcher, broadcasting/film/video in the Last Year: Not on file  . Ran Out of Food in the Last Year: Not on file  Transportation  Needs:   . Lack of Transportation (Medical): Not on file  . Lack of Transportation (Non-Medical): Not on file  Physical Activity:   . Days of Exercise per Week: Not on file  . Minutes of Exercise per Session: Not on file  Stress:   . Feeling of Stress : Not on file  Social Connections:   . Frequency of Communication with Friends and Family: Not on file  . Frequency of Social Gatherings with Friends and Family: Not on file  . Attends Religious Services: Not on file  . Active Member of Clubs or Organizations: Not on file  . Attends Banker Meetings: Not on file  . Marital Status: Not on file  Intimate Partner Violence:   . Fear of Current or Ex-Partner: Not on file  . Emotionally Abused: Not on file  . Physically Abused: Not on file  . Sexually Abused: Not on file    Family History:   Family History  Problem Relation Age of Onset  . Heart attack Mother        Died age 29  . Heart attack Brother        40     ROS:  Please see the history of present illness.  All other ROS reviewed and negative.     Physical Exam/Data:   Vitals:   02/28/20 0940 02/28/20 1100 02/28/20 1230 02/28/20 1330  BP: (!) 131/94 (!) 143/103 (!) 127/110 (!) 127/93  Pulse: 94 97 94 87  Resp: 20 18 20 18   Temp:      TempSrc:      SpO2: 98% 98% 99% 97%  Weight:      Height:        Intake/Output Summary (Last 24 hours) at 02/28/2020 1358 Last data filed at 02/28/2020 0928 Gross per 24 hour  Intake --  Output 1200 ml  Net -1200 ml   Last 3 Weights 02/28/2020 02/11/2020 02/09/2020  Weight (lbs) 214 lb 15.2 oz 215 lb 215 lb  Weight (kg) 97.5 kg 97.523 kg 97.523 kg     Body mass index is 29.15 kg/m.  Vital Signs. BP (!) 127/93   Pulse 87   Temp (!) 97.4 F (36.3 C) (Oral)   Resp 18   Ht 6' (1.829 m)   Wt 97.5 kg   SpO2 97%   BMI 29.15 kg/m  General: Chronically ill appearing AAM in no acute distress. Head: Normocephalic, atraumatic,  sclera non-icteric, no xanthomas, nares  are without discharge. Neck: Negative for carotid bruits. JVP not elevated. Lungs: Clear bilaterally to auscultation without wheezes, rales, or rhonchi. Breathing is unlabored. Heart: RRR S1 S2 without murmurs, rubs, or gallops.  Abdomen: Soft, non-tender, non-distended with normoactive bowel sounds. No rebound/guarding. Extremities: No clubbing or cyanosis. Soft mild nonpitting BLE edema. Distal pedal pulses are 2+ and equal bilaterally. Neuro: Alert and oriented X 3. Moves all extremities spontaneously. Psych:  Responds to questions appropriately with poor insight into the severity of his condition  EKG:  The EKG was personally reviewed and demonstrates:  NSR 97, possible LAE, first degree AVB, nonspecific TWI I, avL, V5-V6 without change from prior  Telemetry:  Telemetry was personally reviewed and demonstrates:  NSR with occasional PVCs and couplets  Relevant CV Studies:  2D Echo 10/2019 1. There appears to be likely chronic LV thrombus at the apex. Patient  without IV access, so contrast not given. . Left ventricular ejection  fraction, by estimation, is <20%. The left ventricle has severely  decreased function. The left ventricle  demonstrates global hypokinesis. The left ventricular internal cavity size  was severely dilated. Left ventricular diastolic parameters are consistent  with Grade III diastolic dysfunction (restrictive). Elevated left  ventricular end-diastolic pressure.  2. Right ventricular systolic function is moderately reduced. The right  ventricular size is moderately enlarged. There is moderately elevated  pulmonary artery systolic pressure.  3. Left atrial size was mild to moderately dilated.  4. Right atrial size was moderately dilated.  5. Small circumferential pericardial effusion, bordering on moderate size  posterior to RV. No echo evidence of tamponade.. The pericardial effusion  is circumferential. There is no evidence of cardiac tamponade.  6. The  mitral valve is normal in structure. Mild to moderate mitral valve  regurgitation. No evidence of mitral stenosis.  7. Tricuspid valve regurgitation is moderate.  8. The aortic valve is tricuspid. Aortic valve regurgitation is not  visualized.  9. The inferior vena cava is dilated in size with <50% respiratory  variability, suggesting right atrial pressure of 15 mmHg.   Conclusion(s)/Recommendation(s): Severely reduced LVEF, grade 3 diastolic  dysfunction with nearly nonexistent A wave and elevated LVEDP. Small to  moderate circumferential pericardial effusion without echo tamponade.  Chronic appearing apical LV thrombus.   Cardiac Cath 12/2017 IMPRESSION: Mr. Knappenberger has a nonischemic cardia myopathy with clean coronary arteries, severe LV dysfunction with elevated LVEDP and filling pressures.  Probably a combination of poorly controlled hypertension and ethanol abuse.  He probably would require additional diuresis and optimal medical therapy.  He may ultimately require an ICD for primary prevention.  A right femoral angiogram was performed and a MYNX closure device was successfully deployed achieving hemostasis.  The patient left the lab in stable condition.  Nanetta Batty. MD, [facility] 12/13/2017 4:55 PM    Laboratory Data:  High Sensitivity Troponin:   Recent Labs  Lab 02/09/20 0402 02/09/20 0602 02/28/20 0725 02/28/20 0923  TROPONINIHS 44* 33* 109* 96*     Chemistry Recent Labs  Lab 02/28/20 0725  NA 137  K 4.9  CL 102  CO2 25  GLUCOSE 103*  BUN 19  CREATININE 1.75*  CALCIUM 9.4  GFRNONAA 44*  ANIONGAP 10    No results for input(s): PROT, ALBUMIN, AST, ALT, ALKPHOS, BILITOT in the last 168 hours. Hematology Recent Labs  Lab 02/28/20 0725  WBC 6.3  RBC 5.12  HGB 14.9  HCT 47.3  MCV 92.4  MCH 29.1  MCHC 31.5  RDW 15.8*  PLT 201   BNP Recent Labs  Lab 02/28/20 0725  BNP 2,265.7*    DDimer No results for input(s): DDIMER in the last 168  hours.   Radiology/Studies:  DG Chest 2 View  Result Date: 02/28/2020 CLINICAL DATA:  Shortness of breath.  Chest pain.  History of CHF. EXAM: CHEST - 2 VIEW COMPARISON:  None. FINDINGS: Similar enlarged cardiac silhouette. Both lungs are clear. No visible pleural effusions or pneumothorax. The visualized skeletal structures are unremarkable. IMPRESSION: Similar cardiomegaly without acute cardiopulmonary disease. Electronically Signed   By: Feliberto Harts MD   On: 02/28/2020 07:51     Assessment and Plan:   1. Chest pain/elevated troponin - patient is not forthcoming with details but suspect demand process based on low/flat troponins (with chronic troponemia), unchanged EKG, and normal coronary arteries in 2019 - chest pain resolved - given habitual noncompliance he is not a candidate for invasive cardiac catheterization so would recommend supportive care to include addition of nitrate (If he will take it) and avoidance of beta blockers due to ongoing cocaine use  2. Shortness of breath - s/p one dose of IV Lasix with subsequent ReDS vest reading of 33% indicating normal thoracic impedence, - CXR also clear - suspect multifactorial possibly driven by severe cardiomyopathy and fluid retention but also may have underlying component of COPD with bronchospasm in context of longstanding tobacco use and ongoing cocaine use  3. Acute on chronic combined biventicular CHF/NICM - as above (will review further diuresis with MD) - not sure what we else have to offer specialty-wise in this situation if patient is unwilling/unable to take medication as outpatient. The crux of his issues are his social situation which I believe is likely contributing to his poor insight and compliance. What we do within the walls of this hospital may help him for the short term he is admitted but otherwise once his prognosis remains poor if he remains noncompliant with medical recommendations - would not pursue further  beta blocker therapy given his continued use of cocaine - in a perfect world he would be on BB, ACEi/ARB/ARNI, spironolactone, etc for his heart failure but it would seem at this point symptom management seems the most appropriate to at least keep him comfortable. He reports he "swells up" when he takes his medicines so would hold off on resumption of ACEi/ARB at present time (I wonder if by the time he takes them, he is otherwise swelling from his heart failure)  4. History of LV thrombus - noncompliant with INR checks and Coumadin - refuses to take anticoagulation due to medications making him feel bad - does not appear to be a candidate for alternative anticoagulants at this time due his noncompliance with both follow-up and taking them reliably  5. Homelessness with ongoing polysubstance abuse (+ cocaine) - the crux of his issues - very sad/unfortunate situation - discussed that he could die even from one more use of cocaine - recommend social work consultation  6. Left flank pain - further eval per primary team  7. CKD stage III - general uptrend in Cr noted over the last few months, unclear if this represents AKI or new baseline (previously 1.2-1.4, then 1.6-1.7 in recent months)  8. HTN (primarily diastolic) - manage in context of the above      HEAR Score (for undifferentiated chest pain):  HEAR Score: 3   New York Heart Association (NYHA) Functional Class NYHA Class IV  For questions or updates, please contact [facility] Please consult [email or web address] for contact info under    Signed, Laurann Montana, PA-C  02/28/2020 1:58 PM  Patient seen and examined and agree with Ronie Spies, PA-C.  In brief, patient is 59 year old homeless male with a history of poor compliance, polysubstance abuse including ongoing cocaine abuse and tobacco use, former alcohol abuse, schizoaffective disorder, NICM and chronic combined CHF, moderate pulmonary HTN, small pericardial effusion  by echo 10/2019, LV thrombus (noncompliant with Coumadin), COPD, HTN, NSVT, CKD stage III who presented with worsening SOB, LE edema, and chest pain found to have acute on chronic systolic heart failure in the setting of medication non-compliance.  Unfortunately, patient has been non-compliant and has not been following up in clinic despite multiple attempts. Continues to use cocaine. Returns with volume overload in the setting of not taking his medications. Stopped warfarin because it caused bleeding but was not following INR. Stopped other meds because he thought they caused "facial swelling." Very difficult social situation to navigate. Ultimately, it would be ideal to find him more stable housing with access to transportation to clinic appointments. GDMT limited due to lack of compliance and regular follow-up.  Exam: GEN: No acute distress.   Neck: No JVD Cardiac: RRR, no murmurs, rubs, or gallops.  Respiratory: Faint crackles at the bases GI: Soft, nontender, non-distended  MS: 2+ pitting edema to the knee Neuro:  Nonfocal  Psych: Normal affect   Plan: -Start diuresis with lasix 40mg  IV BID -Unable to prescribe GDMT due to significant non-compliance and lack of regular follow-up -Will start hydralazine and imdur for afterload reduction -Patient reports "facial swelling" with ACE/ARB so would be hesitant to restart -Did not reliably take warfarin for LV thrombus and patient refuses AC due "bleeding" although he did not have regular INR checks--will hold off trying to resume until he has more stable follow-up -Not a candidate for ICD due to noncompliance -Substance abuse counseling -Social work consult  , MD

## 2020-02-28 NOTE — ED Notes (Signed)
Pty requesting a salad again. Pt re educated that the ER does not have salads. Pt in NAD. Resp even and unlabored. Wife at bedside.

## 2020-02-29 DIAGNOSIS — I5043 Acute on chronic combined systolic (congestive) and diastolic (congestive) heart failure: Secondary | ICD-10-CM

## 2020-02-29 DIAGNOSIS — I472 Ventricular tachycardia: Secondary | ICD-10-CM

## 2020-02-29 DIAGNOSIS — R079 Chest pain, unspecified: Secondary | ICD-10-CM

## 2020-02-29 DIAGNOSIS — J449 Chronic obstructive pulmonary disease, unspecified: Secondary | ICD-10-CM

## 2020-02-29 DIAGNOSIS — I5023 Acute on chronic systolic (congestive) heart failure: Secondary | ICD-10-CM

## 2020-02-29 DIAGNOSIS — N1831 Chronic kidney disease, stage 3a: Secondary | ICD-10-CM

## 2020-02-29 LAB — BASIC METABOLIC PANEL
Anion gap: 8 (ref 5–15)
BUN: 20 mg/dL (ref 8–23)
CO2: 26 mmol/L (ref 22–32)
Calcium: 8.8 mg/dL — ABNORMAL LOW (ref 8.9–10.3)
Chloride: 105 mmol/L (ref 98–111)
Creatinine, Ser: 1.61 mg/dL — ABNORMAL HIGH (ref 0.61–1.24)
GFR, Estimated: 48 mL/min — ABNORMAL LOW (ref >60)
Glucose, Bld: 118 mg/dL — ABNORMAL HIGH (ref 70–99)
Potassium: 4.1 mmol/L (ref 3.5–5.1)
Sodium: 139 mmol/L (ref 135–145)

## 2020-02-29 LAB — MAGNESIUM: Magnesium: 1.8 mg/dL (ref 1.7–2.4)

## 2020-02-29 MED ORDER — FUROSEMIDE 10 MG/ML IJ SOLN: 80.0000 mg | Freq: Two times a day (BID) | INTRAMUSCULAR | Status: DC

## 2020-02-29 MED ORDER — MAGNESIUM SULFATE 2 GM/50ML IV SOLN
2.0000 g | Freq: Once | INTRAVENOUS | Status: AC
  Administered 2020-02-29: 2 g via INTRAVENOUS
  Filled 2020-02-29: qty 50

## 2020-02-29 MED ORDER — EPINEPHRINE PF 1 MG/ML IJ SOLN
0.3000 mg | Freq: Once | INTRAMUSCULAR | Status: DC | PRN
  Filled 2020-02-29: qty 1

## 2020-02-29 MED ORDER — EPINEPHRINE 0.3 MG/0.3ML IJ SOAJ: 0.3000 mg | Freq: Once | INTRAMUSCULAR | Status: DC

## 2020-02-29 MED ORDER — EPINEPHRINE 1 MG/10ML IJ SOSY: 0.5000 mg | PREFILLED_SYRINGE | Freq: Once | INTRAMUSCULAR | Status: DC | PRN

## 2020-02-29 MED ORDER — DIPHENHYDRAMINE HCL 50 MG/ML IJ SOLN
50.0000 mg | Freq: Every evening | INTRAMUSCULAR | Status: DC | PRN
  Administered 2020-02-29 (×2): 50 mg via INTRAVENOUS
  Filled 2020-02-29 (×2): qty 1

## 2020-02-29 MED ORDER — TORSEMIDE 20 MG PO TABS
40.0000 mg | ORAL_TABLET | Freq: Every day | ORAL | Status: DC
  Administered 2020-02-29 – 2020-03-03 (×4): 40 mg via ORAL
  Filled 2020-02-29 (×4): qty 2

## 2020-02-29 NOTE — Progress Notes (Addendum)
Called regarding patient having 24 beat run VT Not symptomatic He continues to have CHF and dyspnea Says he can't urinate  No chest pain Reviewed notes from Dr Anne Fu regarding lasix and patient nurse do not want to administer In regard to his VT notes indicate not a candidate for AICD. Check Mg and give 2g over 2 hours If low If recurrent start iv amiodarone  On exam he has basilar rales with JVP elevation Only the right side of his neck is swollen ? adneopathy vs goiter Abdomen distended ? Ascites plus one edema  Pharmacist has had lengthy discussions with primary service, Dr Anne Fu and patient As well as myself He is at risk for intubation if CHF does not improve will try demedex Bid and see how he does Again his neck swelling is unilateral on right and he has no Edema of his tongue or oral mucosa and no wheezing   Critical care time including patient exam/interview Pharmacy / nurse discussion and review of chart As well as telemetry 60 minutes  Charlton Haws MD [facility]

## 2020-02-29 NOTE — Care Management (Signed)
Patient is followed by Waynetta Sandy RN CM Congreg[facility] Nurse Program [place], at [facility], (Dept: [phone]) who coordinates services and transportation to appointments.

## 2020-02-29 NOTE — Progress Notes (Signed)
Subjective:  IMTS was paged [phone] regarding Mr. Sultana have face and neck swelling after receiving IV Lasix, and patient having allergies to lasix. IMTS saw the patient at bedside. He states that he has been having facial swelling since starting Lasix in the hospital, and that when he has had this reaction in the past, his symptoms typically occur in the face and legs. He endorses subjective palpitations. Denies difficulty handling secretions, headache, chest pain, abdominal pain, flushing, or pruritis.   Physical Exam Constitutional:      General: He is not in acute distress.    Appearance: He is not ill-appearing or toxic-appearing.  HENT:     Head: Normocephalic.     Nose: Nose normal.     Mouth/Throat:     Mouth: Mucous membranes are moist.     Pharynx: Oropharynx is clear. No oropharyngeal exudate or posterior oropharyngeal erythema.     Comments: No angioedema of the lips or tongue. Able to handle secretions.  Neck:     Comments: Neck swollen bilaterally, ROM wnl Cardiovascular:     Rate and Rhythm: Normal rate and regular rhythm.     Pulses: Normal pulses.  Pulmonary:     Effort: Pulmonary effort is normal.  Musculoskeletal:     Right lower leg: Edema present.     Left lower leg: Edema present.     Comments: 1+ Pitting edema bilaterally in the lower extremities.   Skin:    General: Skin is warm.     Findings: No erythema or rash.     Comments: No urticaria, excoriations.   Neurological:     Mental Status: He is alert and oriented to person, place, and time.  Psychiatric:        Mood and Affect: Mood normal.        Behavior: Behavior normal.    A/P  Patient with allergy to lasix presents with neck swelling after receiving several doses in the ED and overnight for diureses. Patient has subjective palpitations at this time. Objectively his neck in swollen in appearance, has full ROM, able to protect his airway at this time. No angioedema at present. Vital signs are  reassuring. Instructed patient to reach out to staff if he begins to experience signs of anaphylaxis. Patient voiced understanding.  - Benadryl 50 mg IV - EpiPen 0.3 IM PRN for anaphylaxis - D/C Lasix  Dolan Amen, MD IMTS, PGY-2 02/29/2020,6:15 AM  On call pager: [phone]

## 2020-02-29 NOTE — Progress Notes (Signed)
° ° °  Had conference with pharmacist Sherron Monday regarding potential concern over Lasix allergy.  In chart review, his July hospitalization included Lasix for diuresis.  He had no difficulties with this.  The same holds true for his September 2019 admission for example where IV Lasix was utilized.  He had no difficulties.  He took a dose of Lasix yesterday on the 19th in the morning hours.  At 5 AM, approximately 18 hours later, a rapid response was called.  The patient felt like he had some swelling in his neck.  Upon exam from the rapid response team, there was no objective evidence of allergic reaction.  No angioedema was noted. Patient was not ill-appearing.  He said that when he is taken Lasix in the past he had swelling in his legs and face.  Once again, I was able to find several instances where he received IV Lasix without any difficulty.  I would go ahead and feel comfortable administering IV Lasix.  Since he is in the hospital setting, if there is any evidence of true anaphylaxis or angioedema or extensive hives or serious hypotension, he is in the right place to obtain treatment for this.  There is also a chance that his schizophrenia is playing a role.  Once again he is taking Lasix up until quite recently, in fact on 02/11/2020 he was in the ER secondary to homelessness after being kicked out of his girlfriend's home and reported peripheral edema.  He reported that his Lasix was at his girlfriend's house and he had not had any in 2 to 3 days.  Donato Schultz, MD

## 2020-02-29 NOTE — Progress Notes (Addendum)
  ReDS Clip Diuretic Study Pt study # [id number]  Your patient has been enrolled in the ReDS Clip Diuretic Study  Weight on admission 214 lbs (had been 209 lbs in October)- remains unchanged. BNP elevated 2,265. SCr down slightly to 1.6. Uop -2.1 L/24 hr.  Not taking his PTA lasix regularly at home. Has received one dose of IV lasix 40 mg on 11/19@0809 .   ReDS reading is 41% (goal <35%)  Changes to prescribed diuretics recommended:  Continue IV diuresis - will increase dose.    Provider contacted: Dr Anne Fu  Recommendation was accepted by provider.  REDS Clip  READING= 41%  CHEST RULER = 34 Clip Station = D   Orthodema score = 2 Signs/Symptoms Score   Mild edema, no orthopnea 0 No congestion  Moderate edema, no orthopnea 1 Low-grade orthodema/congestion  Severe edema OR orthopnea 2   Moderate edema and orthopnea 3 High-grade orthodema/congestion  Severe edema AND orthopnea 4    Sherron Monday, PharmD, BCCCP Clinical Pharmacist  Phone: [phone] 02/29/2020 9:56 AM  Please check AMION for all [facility] phone numbers After 10:00 PM, call Main Pharmacy [phone]  ADDENDUM Talked with Dr Anne Fu followed by Dr Eden Emms and also the internal medicine team:  Given timing of lasix IV dose on 11/19 at 0809 and rapid response documentation at 11/20 at 0556. Note indicates no angioedema of lips or tongue but bilaterally neck swelling. Was hemodynamically stable without any urticaria or excoriations.   Discussed with Dr Anne Fu, given in hospital, in need of diuresis, and could trial IV lasix - which would allow [place] to treat if allergy was true. If true allergy, will consider alternative like ethacrynic acid.   Discussed with patient who is not willing to do IV lasix and is aware of edema/need for diuretics.   Pt refusing IV lasix - discussed with Dr Eden Emms who wants to trial torsemide tonight. RN aware of possible reaction and to monitor closely.   Given difficulties of being able to  follow diuretic protocol with ReDs study - patient will be un-enrolled from trial.  Sherron Monday, PharmD, BCCCP Clinical Pharmacist

## 2020-02-29 NOTE — Plan of Care (Signed)
  Problem: Elimination: Goal: Will not experience complications related to bowel motility Outcome: Progressing  New Medication

## 2020-02-29 NOTE — Progress Notes (Signed)
Pt is increasing ongoing SOB with HR- 70s with 21 run of VT, b/p-142/117. Paged MD to make aware.

## 2020-02-29 NOTE — Progress Notes (Signed)
Pt has agreed to take torsemide even though the medication is in the same classification as the furosemide. As noted  by Cardiologist the benefits out way the risk of not taking diuretics in question. Medication given has been reviewed by Writer, Patient, Pharmacy as IM team  Also talk to patient and significant other about hidden salts that are found in process foods that they are eating at the bedside.

## 2020-02-29 NOTE — Progress Notes (Addendum)
Progress Note  Patient Name: Allen Mayer Date of Encounter: 02/29/2020  [facility] Cardiologist: Rollene Rotunda, MD   Subjective   Feels a little bit better today.  Less fluid.  Inpatient Medications    Scheduled Meds: . ARIPiprazole  10 mg Oral BID  . budesonide (PULMICORT) nebulizer solution  0.25 mg Nebulization BID  . citalopram  20 mg Oral Daily  . enoxaparin (LOVENOX) injection  40 mg Subcutaneous Q24H  . furosemide  40 mg Intravenous BID  . hydrALAZINE  25 mg Oral Q8H  . isosorbide mononitrate  15 mg Oral Daily  . sodium chloride flush  3 mL Intravenous Q12H   Continuous Infusions: . sodium chloride     PRN Meds: sodium chloride, acetaminophen, albuterol, diphenhydrAMINE, EPINEPHrine, morphine injection, sodium chloride flush   Vital Signs    Vitals:   02/29/20 0618 02/29/20 0618 02/29/20 0801 02/29/20 0807  BP: (!) 135/101 (!) 131/102  (!) 145/101  Pulse:  90  82  Resp:    20  Temp:    (!) 97.5 F (36.4 C)  TempSrc:    Oral  SpO2:  100% 98% 100%  Weight:      Height:        Intake/Output Summary (Last 24 hours) at 02/29/2020 1002 Last data filed at 02/29/2020 0022 Gross per 24 hour  Intake --  Output 910 ml  Net -910 ml   Last 3 Weights 02/29/2020 02/28/2020 02/11/2020  Weight (lbs) 214 lb 1.1 oz 214 lb 15.2 oz 215 lb  Weight (kg) 97.1 kg 97.5 kg 97.523 kg      Telemetry    No adverse arrhythmias- Personally Reviewed  ECG    Sinus 97 bpm- Personally Reviewed  Physical Exam   GEN: No acute distress.   Neck: No JVD Cardiac: RRR, no murmurs, rubs, or gallops.  Respiratory: Clear to auscultation bilaterally. GI: Soft, nontender, non-distended  MS:  Positive lower extremity edema; No deformity. Neuro:  Nonfocal  Psych: Turning at times in bed  Labs    High Sensitivity Troponin:   Recent Labs  Lab 02/09/20 0402 02/09/20 0602 02/28/20 0725 02/28/20 0923  TROPONINIHS 44* 33* 109* 96*      Chemistry Recent Labs  Lab  02/28/20 0725 02/28/20 1743 02/29/20 0414  NA 137  --  139  K 4.9  --  4.1  CL 102  --  105  CO2 25  --  26  GLUCOSE 103*  --  118*  BUN 19  --  20  CREATININE 1.75* 1.62* 1.Allen*  CALCIUM 9.4  --  8.8*  GFRNONAA 44* 48* 48*  ANIONGAP 10  --  8     Hematology Recent Labs  Lab 02/28/20 0725 02/28/20 1743  WBC 6.3 5.3  RBC 5.12 5.22  HGB 14.9 15.0  HCT 47.3 48.0  MCV 92.4 92.0  MCH 29.1 28.7  MCHC 31.5 31.3  RDW 15.8* 15.9*  PLT 201 193    BNP Recent Labs  Lab 02/28/20 0725  BNP 2,265.7*     DDimer No results for input(s): DDIMER in the last 168 hours.   Radiology    DG Chest 2 View  Result Date: 02/28/2020 CLINICAL DATA:  Shortness of breath.  Chest pain.  History of CHF. EXAM: CHEST - 2 VIEW COMPARISON:  None. FINDINGS: Similar enlarged cardiac silhouette. Both lungs are clear. No visible pleural effusions or pneumothorax. The visualized skeletal structures are unremarkable. IMPRESSION: Similar cardiomegaly without acute cardiopulmonary disease. Electronically Signed  By: Feliberto Harts MD   On: 02/28/2020 07:51    Cardiac Studies   Echocardiogram 10/28/2019:   1. There appears to be likely chronic LV thrombus at the apex. Patient  without IV access, so contrast not given. . Left ventricular ejection  fraction, by estimation, is <20%. The left ventricle has severely  decreased function. The left ventricle  demonstrates global hypokinesis. The left ventricular internal cavity size  was severely dilated. Left ventricular diastolic parameters are consistent  with Grade III diastolic dysfunction (restrictive). Elevated left  ventricular end-diastolic pressure.  2. Right ventricular systolic function is moderately reduced. The right  ventricular size is moderately enlarged. There is moderately elevated  pulmonary artery systolic pressure.  3. Left atrial size was mild to moderately dilated.  4. Right atrial size was moderately dilated.  5. Small  circumferential pericardial effusion, bordering on moderate size  posterior to RV. No echo evidence of tamponade.. The pericardial effusion  is circumferential. There is no evidence of cardiac tamponade.  6. The mitral valve is normal in structure. Mild to moderate mitral valve  regurgitation. No evidence of mitral stenosis.  7. Tricuspid valve regurgitation is moderate.  8. The aortic valve is tricuspid. Aortic valve regurgitation is not  visualized.  9. The inferior vena cava is dilated in size with <50% respiratory  variability, suggesting right atrial pressure of 15 mmHg.   Conclusion(s)/Recommendation(s): Severely reduced LVEF, grade 3 diastolic  dysfunction with nearly nonexistent A wave and elevated LVEDP. Small to  moderate circumferential pericardial effusion without echo tamponade.  Chronic appearing apical LV thrombus.   Patient Profile     Allen Mayer polysubstance abuse schizoaffective disorder here with acute on chronic systolic heart failure  Assessment & Plan    Acute on chronic systolic heart failure-stage D, NYHA class IV-EF less than 20% with chronic appearing LV apical thrombus -Diuresing with IV Lasix 40 mg twice daily.-2.1 L out yesterday.  Excellent.  He still has more to go.  I will increase his Lasix to 80 mg twice a day.  Weight on admission 214 lbs (had been 209 lbs in October)- remains unchanged. BNP elevated 2,265. SCr down slightly to 1.6. Uop -2.1 L/24 hr. ReDS reading is 41% (goal <35%).  -Unable to previously prescribed goal-directed therapy secondary to noncompliance, lack of follow-up. -Would not prescribe ACE inhibitor or ARB since he reported facial swelling. -Agree with hydralazine isosorbide. -Did not take warfarin reliably for LV thrombus previously. -Not a candidate for ICD secondary to noncompliance  Donato Schultz, MD      For questions or updates, please contact [facility] Please consult [email or web address] for contact info under         Signed, Donato Schultz, MD  02/29/2020, 10:02 AM

## 2020-02-29 NOTE — Evaluation (Signed)
Occupational Therapy Evaluation Patient Details Name: Allen Mayer MRN: [id number] DOB: [birth year] Today's Date: 02/29/2020    History of Present Illness Pt is a 59 y.o. with polysubstance abuse, schizoaffective disorder, who presents with acute on chronic systolic heart failure.   Clinical Impression   Patient presents with the above diagnosis.  Barrier is increased edema to lower extremities and non compliance with medical conditions.  He has the physical capacity to complete his self care, mobility in the room ad lib, and toilet skills in the acute setting.  No significant OT needs.  No further OT needed in the acute setting.  Educated patient regarding activity tolerance and taking breaks during self care to reduce shortness of breath.  Patient verbalizes understanding.  PT is seeing him for mobility and endurance.      Follow Up Recommendations  No OT follow up    Equipment Recommendations       Recommendations for Other Services       Precautions / Restrictions Precautions Precautions: None Restrictions Weight Bearing Restrictions: No      Mobility Bed Mobility Overal bed mobility: Independent                  Transfers Overall transfer level: Independent Equipment used: None                  Balance Overall balance assessment: Mild deficits observed, not formally tested Sitting-balance support: Feet supported Sitting balance-Leahy Scale: Good     Standing balance support: No upper extremity supported;During functional activity Standing balance-Leahy Scale: Fair                             ADL either performed or assessed with clinical judgement   ADL Overall ADL's : At baseline                                       General ADL Comments: Patient up and walking in room.  Used bathroom without assit, perfromed light grooming standing sink side without assist.  Able to don hospital gown like a robe in standing.   Changed socks without assist.  Able to walk in room and returned to bed without assist.     Vision Patient Visual Report: No change from baseline       Perception     Praxis      Pertinent Vitals/Pain Pain Assessment: Faces Faces Pain Scale: Hurts a little bit Pain Location: generalized. Pain Descriptors / Indicators: Aching Pain Intervention(s): Monitored during session     Hand Dominance Right   Extremity/Trunk Assessment Upper Extremity Assessment Upper Extremity Assessment: Overall WFL for tasks assessed   Lower Extremity Assessment Lower Extremity Assessment: Defer to PT evaluation RLE Deficits / Details: Increased edema LLE Deficits / Details: Increased edema       Communication Communication Communication: No difficulties   Cognition Arousal/Alertness: Awake/alert Behavior During Therapy: WFL for tasks assessed/performed Overall Cognitive Status: Within Functional Limits for tasks assessed                                     General Comments   VSS    Exercises     Shoulder Instructions      Home Living Family/patient expects to be discharged to:: Shelter/Homeless  Prior Functioning/Environment Level of Independence: Independent with assistive device(s)        Comments: States he has used a cane in the past.        OT Problem List: Impaired balance (sitting and/or standing)      OT Treatment/Interventions:      OT Goals(Current goals can be found in the care plan section) Acute Rehab OT Goals Patient Stated Goal: Patient with no real goal.  States he needs to get this fluid off. OT Goal Formulation: With patient Time For Goal Achievement: 02/29/20 Potential to Achieve Goals: Fair  OT Frequency:     Barriers to D/C:            Co-evaluation              AM-PAC OT "6 Clicks" Daily Activity     Outcome Measure Help from another person eating meals?:  None Help from another person taking care of personal grooming?: None Help from another person toileting, which includes using toliet, bedpan, or urinal?: None Help from another person bathing (including washing, rinsing, drying)?: None Help from another person to put on and taking off regular upper body clothing?: None Help from another person to put on and taking off regular lower body clothing?: None 6 Click Score: 24   End of Session Nurse Communication: Mobility status  Activity Tolerance: Patient tolerated treatment well Patient left: in bed;with call bell/phone within reach;with family/visitor present  OT Visit Diagnosis: Unsteadiness on feet (R26.81)                Time: 3845-3646 OT Time Calculation (min): 22 min Charges:  OT General Charges $OT Visit: 1 Visit OT Evaluation $OT Eval Moderate Complexity: 1 Mod  02/29/2020  Rich, OTR/L  Acute Rehabilitation Services  Office:  [phone]   SKIPPER DACOSTA 02/29/2020, 1:37 PM

## 2020-02-29 NOTE — Significant Event (Signed)
No s/s of reaction to PO torsemide patient is diuresing >1L of urine.

## 2020-02-29 NOTE — Evaluation (Signed)
Physical Therapy Evaluation Patient Details Name: Allen Mayer MRN: [id number] DOB: 1960-04-25 Today's Date: 02/29/2020   History of Present Illness  Pt is a 59 y.o. with polysubstance abuse, schizoaffective disorder, who presents with acute on chronic systolic heart failure.  Clinical Impression  Prior to admission, pt is homeless and uses cane intermittently for mobility. Pt reporting generalized pain and fatigue; agreeable to therapy evaluation. Ambulating 300 feet with a walker at a supervision level. SpO2 97-100% on RA, HR 100-111 bpm. Pt presents with decreased cardiopulmonary endurance. Will follow acutely to progress and promote mobility as tolerated.     Follow Up Recommendations No PT follow up    Equipment Recommendations  Cane    Recommendations for Other Services       Precautions / Restrictions Precautions Precautions: Fall Restrictions Weight Bearing Restrictions: No      Mobility  Bed Mobility Overal bed mobility: Independent                  Transfers Overall transfer level: Independent Equipment used: None                Ambulation/Gait Ambulation/Gait assistance: Supervision Gait Distance (Feet): 300 Feet Assistive device: Rolling walker (2 wheeled) Gait Pattern/deviations: Step-through pattern;Decreased dorsiflexion - right;Decreased dorsiflexion - left Gait velocity: decreased   General Gait Details: Decreased bilateral foot clearance, supervision for safety  Stairs            Wheelchair Mobility    Modified Rankin (Stroke Patients Only)       Balance Overall balance assessment: Needs assistance Sitting-balance support: Feet supported Sitting balance-Leahy Scale: Good     Standing balance support: No upper extremity supported;During functional activity Standing balance-Leahy Scale: Fair                               Pertinent Vitals/Pain Pain Assessment: Faces Faces Pain Scale: Hurts little  more Pain Location: "everywhere" Pain Descriptors / Indicators: Constant Pain Intervention(s): Monitored during session    Home Living Family/patient expects to be discharged to:: Shelter/Homeless                      Prior Function Level of Independence: Independent with assistive device(s)         Comments: Using cane intermittently (does not currently have)     Hand Dominance   Dominant Hand: Right    Extremity/Trunk Assessment   Upper Extremity Assessment Upper Extremity Assessment: Defer to OT evaluation    Lower Extremity Assessment Lower Extremity Assessment: RLE deficits/detail;LLE deficits/detail RLE Deficits / Details: Increased edema LLE Deficits / Details: Increased edema       Communication   Communication: No difficulties  Cognition Arousal/Alertness: Awake/alert Behavior During Therapy: WFL for tasks assessed/performed Overall Cognitive Status: Within Functional Limits for tasks assessed                                        General Comments      Exercises     Assessment/Plan    PT Assessment Patient needs continued PT services  PT Problem List Decreased strength;Decreased activity tolerance;Decreased balance;Decreased mobility       PT Treatment Interventions DME instruction;Gait training;Stair training;Functional mobility training;Therapeutic activities;Therapeutic exercise;Balance training;Patient/family education    PT Goals (Current goals can be found in the Care Plan section)  Acute  Rehab PT Goals Patient Stated Goal: to walk PT Goal Formulation: With patient Time For Goal Achievement: 03/14/20 Potential to Achieve Goals: Good    Frequency Min 3X/week   Barriers to discharge        Co-evaluation               AM-PAC PT "6 Clicks" Mobility  Outcome Measure Help needed turning from your back to your side while in a flat bed without using bedrails?: None Help needed moving from lying on your  back to sitting on the side of a flat bed without using bedrails?: None Help needed moving to and from a bed to a chair (including a wheelchair)?: None Help needed standing up from a chair using your arms (e.g., wheelchair or bedside chair)?: None Help needed to walk in hospital room?: None Help needed climbing 3-5 steps with a railing? : A Little 6 Click Score: 23    End of Session   Activity Tolerance: Patient tolerated treatment well Patient left: in bed;with call bell/phone within reach Nurse Communication: Mobility status PT Visit Diagnosis: Unsteadiness on feet (R26.81);Difficulty in walking, not elsewhere classified (R26.2)    Time: 1047-1101 PT Time Calculation (min) (ACUTE ONLY): 14 min   Charges:   PT Evaluation $PT Eval Moderate Complexity: 1 Mod          Lillia Pauls, PT, DPT Acute Rehabilitation Services Pager [phone] Office [phone]   Norval Morton 02/29/2020, 12:01 PM

## 2020-02-29 NOTE — Progress Notes (Signed)
Pt reported neck swelling. Pt reports neck feels tight when inhaling. On assessment pt neck swollen. SPO2 100% Paged provider. Paged rapid response to inform.  Provider at bedside.

## 2020-02-29 NOTE — Progress Notes (Signed)
Subjective:   Hospital day: 1  Overnight event: MD on-call was paged for neck and facial swelling after receiving IV Lasix. Bedside evaluation remarkable for bilateral neck swelling. His airway was patent and he was able to handle secretions. IV Benadryl 50 mg was given. Lasix was discontinued.  When evaluated patient at bedside, he had bilateral neck swelling with angioedema. His airway was patent. Patient states that when he takes Lasix, face well, legs improve. When stop Lasix, face improved, legs well. Patient expressed that he does not like to take his medication because of this facial swelling.   Objective:  Vital signs in last 24 hours: Vitals:   02/29/20 0022 02/29/20 0523 02/29/20 0618 02/29/20 0618  BP: 124/88 (!) 135/91 (!) 135/101 (!) 131/102  Pulse: 92 77  90  Resp:  20    Temp: 97.9 F (36.6 C) 97.6 F (36.4 C)    TempSrc: Oral Oral    SpO2: 96% 96%  100%  Weight:      Height:       CBC Latest Ref Rng & Units 02/28/2020 02/28/2020 02/09/2020  WBC 4.0 - 10.5 K/uL 5.3 6.3 5.8  Hemoglobin 13.0 - 17.0 g/dL 66.2 94.7 65.4  Hematocrit 39 - 52 % 48.0 47.3 47.2  Platelets 150 - 400 K/uL 193 201 186   CMP Latest Ref Rng & Units 02/29/2020 02/28/2020 02/28/2020  Glucose 70 - 99 mg/dL 650(P) - 546(F)  BUN 8 - 23 mg/dL 20 - 19  Creatinine 6.81 - 1.24 mg/dL 2.75(T) 7.00(F) 7.49(S)  Sodium 135 - 145 mmol/L 139 - 137  Potassium 3.5 - 5.1 mmol/L 4.1 - 4.9  Chloride 98 - 111 mmol/L 105 - 102  CO2 22 - 32 mmol/L 26 - 25  Calcium 8.9 - 10.3 mg/dL 4.9(Q) - 9.4  Total Protein 6.5 - 8.1 g/dL - - -  Total Bilirubin 0.3 - 1.2 mg/dL - - -  Alkaline Phos 38 - 126 U/L - - -  AST 15 - 41 U/L - - -  ALT 0 - 44 U/L - - -     Physical Exam  Physical Exam Constitutional:      General: He is not in acute distress.    Comments: Somnolent likely due to IV Benadryl  HENT:     Head: Normocephalic.     Comments: Bilateral neck swelling with angioedema. Airway patent Eyes:      General:        Right eye: No discharge.        Left eye: No discharge.  Cardiovascular:     Rate and Rhythm: Normal rate and regular rhythm.  Pulmonary:     Effort: No respiratory distress.     Comments: Mild crackles at lung bases Musculoskeletal:     Right lower leg: Edema (+1) present.     Left lower leg: Edema (+1 ) present.  Skin:    General: Skin is warm.  Psychiatric:        Mood and Affect: Mood normal.     Assessment/Plan: Jemaine Prokop is a 59 y.o. male with hx of heart failure with NICM (EF<20%, G3DD), LV thrombus, and ongoing cocaine use who presented with shortness of breath and chest pain which resolved in the ED. continue diuresis however has allergic reaction to Lasix.  Active Problems:   Acute exacerbation of congestive heart failure (HCC)  Chest pain (resolved on admission) -No ischemic changes appreciable on EKG. trops 109>96.  -Last left and right heart cath in  2019 showed clean coronary arteries Acute on chronic combined heart failure (EF <20%, G3DD) secondary to medication non-compliance and ongoing cocaine use.   -Difficult to ascertain a dry weight in the setting of poor follow up. Weight up about 5lb from October -per cardiology, not candidate for ICD or life vest in the setting of non-compliance LV thrombus--pt refusing anticoagulation. Prescribed warfarin however INR 1.2 today, suggesting he has not been taking it. I agree with cardiology that warfarin is not feasible in the setting of poor follow up and non-compliance. -Lasix allergic reaction? It appears that patient had an allergic reaction to Lasix with neck swelling and angioedema.  Patient has not had any ACE/ARB on admission and states that he has not taking any medication for about 1 month.  Unlikely to be ACE/ARB related.  Will discuss with Dr. Anne Fu and pharmacy about possible alternative diuretic.  Plan: management per cardiology -Appreciate cardiology recommendation -Holding Lasix at this  time.  Pharmacy will work with Dr. Anne Fu about either continuing Lasix or using indeterminate if diuretic.  Really appreciate the help from pharmacy. -Continue hydralazine and imdur  -no ACE/ARB in setting of pt reported "facial swelling" -avoid beta blockade in the setting of ongoing cocaine use -warfarin discontinued as he is not taking it anyways. Can consider resuming if he becomes more aggreeable to [facility] in the future and has better follow up -Can consider palliative conversation outpatient    Polysubstance abuse. Homeless. UDS + cocaine. SW consulted. He would benefit from outpatient SW and substance abuse counseling however I am doubtful that he will follow up with this.   CKD 3a. His baseline creatine in August was 1.2 and 1.6 in October. Maybe 1.6 is his new baseline. Daily BMP to monitor electrolytes/renal function with diuresis.    COPD. Continue home inhalers.    Prior to Admission Living Arrangement: Homeless Anticipated Discharge Location: To be determined Barriers to Discharge: Diuresis and medical management Dispo: Anticipated discharge in approximately 2-3 day(s).   Doran Stabler, DO 02/29/2020, 6:45 AM Pager: [phone] After 5pm on weekdays and 1pm on weekends: On Call pager [phone]

## 2020-03-01 ENCOUNTER — Inpatient Hospital Stay (HOSPITAL_COMMUNITY): Payer: Medicaid Other

## 2020-03-01 DIAGNOSIS — I5023 Acute on chronic systolic (congestive) heart failure: Secondary | ICD-10-CM | POA: Diagnosis not present

## 2020-03-01 DIAGNOSIS — F3189 Other bipolar disorder: Secondary | ICD-10-CM

## 2020-03-01 DIAGNOSIS — F1110 Opioid abuse, uncomplicated: Secondary | ICD-10-CM

## 2020-03-01 DIAGNOSIS — I5041 Acute combined systolic (congestive) and diastolic (congestive) heart failure: Secondary | ICD-10-CM

## 2020-03-01 DIAGNOSIS — R0602 Shortness of breath: Secondary | ICD-10-CM

## 2020-03-01 LAB — BASIC METABOLIC PANEL
Anion gap: 10 (ref 5–15)
BUN: 17 mg/dL (ref 8–23)
CO2: 28 mmol/L (ref 22–32)
Calcium: 9.4 mg/dL (ref 8.9–10.3)
Chloride: 104 mmol/L (ref 98–111)
Creatinine, Ser: 1.48 mg/dL — ABNORMAL HIGH (ref 0.61–1.24)
GFR, Estimated: 53 mL/min — ABNORMAL LOW (ref >60)
Glucose, Bld: 88 mg/dL (ref 70–99)
Potassium: 3.9 mmol/L (ref 3.5–5.1)
Sodium: 142 mmol/L (ref 135–145)

## 2020-03-01 MED ORDER — OXYCODONE HCL 5 MG PO TABS
10.0000 mg | ORAL_TABLET | Freq: Once | ORAL | Status: AC
  Administered 2020-03-01: 10 mg via ORAL
  Filled 2020-03-01: qty 2

## 2020-03-01 MED ORDER — POLYETHYLENE GLYCOL 3350 17 G PO PACK
17.0000 g | PACK | Freq: Every day | ORAL | Status: DC
  Administered 2020-03-01 – 2020-03-03 (×3): 17 g via ORAL
  Filled 2020-03-01 (×3): qty 1

## 2020-03-01 NOTE — Progress Notes (Signed)
Subjective:   Tolerated torsemide well last evening. No complaints this morning. Able to lay flat.   Objective:  Vital signs in last 24 hours: Vitals:   02/29/20 2144 03/01/20 0500 03/01/20 0535 03/01/20 0540  BP: (!) 136/97  133/90 133/90  Pulse: 79   87  Resp: (!) 22   (!) 22  Temp: 98.1 F (36.7 C)   (!) 97.5 F (36.4 C)  TempSrc: Oral   Oral  SpO2: 100%   100%  Weight:  87.6 kg    Height:       CBC Latest Ref Rng & Units 02/28/2020 02/28/2020 02/09/2020  WBC 4.0 - 10.5 K/uL 5.3 6.3 5.8  Hemoglobin 13.0 - 17.0 g/dL 90.2 40.9 73.5  Hematocrit 39 - 52 % 48.0 47.3 47.2  Platelets 150 - 400 K/uL 193 201 186   CMP Latest Ref Rng & Units 03/01/2020 02/29/2020 02/28/2020  Glucose 70 - 99 mg/dL 88 329(J) -  BUN 8 - 23 mg/dL 17 20 -  Creatinine 2.42 - 1.24 mg/dL 6.83(M) 1.96(Q) 2.29(N)  Sodium 135 - 145 mmol/L 142 139 -  Potassium 3.5 - 5.1 mmol/L 3.9 4.1 -  Chloride 98 - 111 mmol/L 104 105 -  CO2 22 - 32 mmol/L 28 26 -  Calcium 8.9 - 10.3 mg/dL 9.4 9.8(X) -  Total Protein 6.5 - 8.1 g/dL - - -  Total Bilirubin 0.3 - 1.2 mg/dL - - -  Alkaline Phos 38 - 126 U/L - - -  AST 15 - 41 U/L - - -  ALT 0 - 44 U/L - - -     Physical Exam General: chronically ill appearing Cardiac: RRR. +2 LE edema. Pulm: lungs clear   Assessment/Plan: Allen Mayer is a 59 y.o. male with hx of heart failure with NICM (EF<20%, G3DD), LV thrombus, and ongoing cocaine use who presented with shortness of breath and chest pain which resolved in the ED. continue diuresis however has allergic reaction to Lasix.  Active Problems:   Acute exacerbation of congestive heart failure (HCC)  Chest pain (resolved on admission) -No ischemic changes appreciable on EKG. trops 109>96.  -Last left and right heart cath in 2019 showed clean coronary arteries Acute on chronic combined heart failure (EF <20%, G3DD) (NYHA class IV) secondary to medication non-compliance and ongoing cocaine use.   -Difficult  to ascertain a dry weight in the setting of poor follow up. Weight up about 5lb from October -per cardiology, not candidate for ICD or life vest in the setting of non-compliance LV thrombus--pt refusing anticoagulation. Prescribed warfarin however INR 1.2 today, suggesting he has not been taking it. I agree with cardiology that warfarin is not feasible in the setting of poor follow up and non-compliance. Plan: management per cardiology -no allergic reaction noted from torsemide trial yesterday--lasix removed from allergy list. -Continue hydralazine and imdur  -no ACE/ARB in setting of pt reported "facial swelling" -avoid beta blockade in the setting of ongoing cocaine use  Neck swelling. Appears improved today. No further workup at this time.  Polysubstance abuse. UDS + cocaine. Recommend cessation. CKD 3a. Stable. Daily BMP to monitor electrolytes/renal function with diuresis.  COPD. Continue home inhalers.  Chronic bipolar disorder: continue abilify, celexa  Prior to Admission Living Arrangement: home Anticipated Discharge Location: home Barriers to Discharge: pending cardiology sign off Dispo: per cardiology  Elige Radon, MD Internal Medicine Resident PGY-2 [facility] Pager: [phone] 03/01/2020 7:10 AM    After 5pm on weekdays and  1pm on weekends: On Call pager [phone]

## 2020-03-01 NOTE — Progress Notes (Signed)
Progress Note  Patient Name: Allen Mayer Date of Encounter: 03/01/2020  [facility] Cardiologist: Rollene Rotunda, MD   Subjective   Feels a little bit better today.  Less fluid.  More comfortable.  No trouble swallowing no shortness of breath that is significant no secretions no hives.  Inpatient Medications    Scheduled Meds: . ARIPiprazole  10 mg Oral BID  . budesonide (PULMICORT) nebulizer solution  0.25 mg Nebulization BID  . citalopram  20 mg Oral Daily  . enoxaparin (LOVENOX) injection  40 mg Subcutaneous Q24H  . hydrALAZINE  25 mg Oral Q8H  . isosorbide mononitrate  15 mg Oral Daily  . sodium chloride flush  3 mL Intravenous Q12H  . torsemide  40 mg Oral Daily   Continuous Infusions: . sodium chloride     PRN Meds: sodium chloride, acetaminophen, albuterol, diphenhydrAMINE, EPINEPHrine, morphine injection, sodium chloride flush   Vital Signs    Vitals:   03/01/20 0500 03/01/20 0535 03/01/20 0540 03/01/20 0839  BP:  133/90 133/90   Pulse:   87   Resp:   (!) 22   Temp:   (!) 97.5 F (36.4 C)   TempSrc:   Oral   SpO2:   100% 100%  Weight: 87.6 kg     Height:        Intake/Output Summary (Last 24 hours) at 03/01/2020 1006 Last data filed at 02/29/2020 2146 Gross per 24 hour  Intake 284.17 ml  Output 3350 ml  Net -3065.83 ml   Last 3 Weights 03/01/2020 02/29/2020 02/28/2020  Weight (lbs) 193 lb 1.6 oz 214 lb 1.1 oz 214 lb 15.2 oz  Weight (kg) 87.59 kg 97.1 kg 97.5 kg      Telemetry    No adverse arrhythmias- Personally Reviewed  ECG    Sinus 97 bpm- Personally Reviewed  Physical Exam   GEN: No acute distress.   Neck: No JVD, no significant appreciable right-sided neck swelling at this time. Cardiac: RRR, no murmurs, rubs, or gallops.  Respiratory: Clear to auscultation bilaterally. GI: Soft, nontender, non-distended  MS:  Positive 2+ lower extremity edema; No deformity. Neuro:  Nonfocal  Psych: Turning at times in bed  Labs     High Sensitivity Troponin:   Recent Labs  Lab 02/09/20 0402 02/09/20 0602 02/28/20 0725 02/28/20 0923  TROPONINIHS 44* 33* 109* 96*      Chemistry Recent Labs  Lab 02/28/20 0725 02/28/20 0725 02/28/20 1743 02/29/20 0414 03/01/20 0453  NA 137  --   --  139 142  K 4.9  --   --  4.1 3.9  CL 102  --   --  105 104  CO2 25  --   --  26 28  GLUCOSE 103*  --   --  118* 88  BUN 19  --   --  20 17  CREATININE 1.75*   < > 1.62* 1.61* 1.48*  CALCIUM 9.4  --   --  8.8* 9.4  GFRNONAA 44*   < > 48* 48* 53*  ANIONGAP 10  --   --  8 10   < > = values in this interval not displayed.     Hematology Recent Labs  Lab 02/28/20 0725 02/28/20 1743  WBC 6.3 5.3  RBC 5.12 5.22  HGB 14.9 15.0  HCT 47.3 48.0  MCV 92.4 92.0  MCH 29.1 28.7  MCHC 31.5 31.3  RDW 15.8* 15.9*  PLT 201 193    BNP Recent Labs  Lab  02/28/20 0725  BNP 2,265.7*     DDimer No results for input(s): DDIMER in the last 168 hours.   Radiology    CT SOFT TISSUE NECK WO CONTRAST  Result Date: 03/01/2020 CLINICAL DATA:  59 year old male with neck swelling for several days greater on the right. Query lymphadenopathy. EXAM: CT NECK WITHOUT CONTRAST TECHNIQUE: Multidetector CT imaging of the neck was performed following the standard protocol without intravenous contrast. COMPARISON:  Chest radiographs 02/28/2020 and earlier. CTA chest 12/11/2017 FINDINGS: Pharynx and larynx: Pharyngeal soft tissue contours are within normal limits. There is a small retropharyngeal effusion (series 3, image 41) and mild bilateral parapharyngeal soft tissue stranding, with similar generalized subcutaneous stranding throughout the visible neck and upper chest. The glottis is closed. In the absence of contrast no laryngeal mass is evident. Negative subglottic trachea. Salivary glands: Sublingual space, noncontrast submandibular glands and parotid glands appear within normal limits. Mild sublingual space inflammatory stranding similar to  that noted diffusely. Thyroid: Negative noncontrast appearance. Lymph nodes: Mild bilateral generalized soft tissue stranding in the superficial and deep soft tissue spaces of the neck. No evidence of superimposed cervical lymphadenopathy on this noncontrast exam. Note that the generalized stranding relatively spares a small left suboccipital lipoma on series 3, image 6 (13 x 33 x 21 mm AP by transverse by CC). Vascular: Vascular patency is not evaluated in the absence of IV contrast. Mild cervical carotid calcified atherosclerosis on the right. Limited intracranial: Mild Calcified atherosclerosis at the skull base. Negative visible noncontrast brain parenchyma. Visualized orbits: Negative. Mastoids and visualized paranasal sinuses: Clear aside from trace mucosal thickening in the right sphenoid sinus. Skeleton: Occasional carious dentition. Widespread degenerative changes in the cervical spine. No acute osseous abnormality identified. Upper chest: Questionable trace layering pleural effusions. Mild dependent atelectasis and mild septal thickening in the upper lungs. A small left upper lobe subpleural lung nodule on series 4, image 46 is stable since 2019 and benign. Mild chronic subpleural scarring in the right upper lobe is also stable on series 4, image 55 today. Visible carina is patent. No superior mediastinal lymphadenopathy is evident. Mildly dilated air and fluid containing esophagus appears stable since 2019. IMPRESSION: 1. Negative for lymphadenopathy. Positive for generalized soft tissue edema in the bilateral neck including a small retropharyngeal space effusion. The appearance favors generalized 3rd spacing of fluid. 2. Possible trace pleural effusions with pulmonary septal thickening but no overt edema in the visible upper lungs. Electronically Signed   By: Odessa Fleming M.D.   On: 03/01/2020 09:52    Cardiac Studies   Echocardiogram 10/28/2019:   1. There appears to be likely chronic LV thrombus at  the apex. Patient  without IV access, so contrast not given. . Left ventricular ejection  fraction, by estimation, is <20%. The left ventricle has severely  decreased function. The left ventricle  demonstrates global hypokinesis. The left ventricular internal cavity size  was severely dilated. Left ventricular diastolic parameters are consistent  with Grade III diastolic dysfunction (restrictive). Elevated left  ventricular end-diastolic pressure.  2. Right ventricular systolic function is moderately reduced. The right  ventricular size is moderately enlarged. There is moderately elevated  pulmonary artery systolic pressure.  3. Left atrial size was mild to moderately dilated.  4. Right atrial size was moderately dilated.  5. Small circumferential pericardial effusion, bordering on moderate size  posterior to RV. No echo evidence of tamponade.. The pericardial effusion  is circumferential. There is no evidence of cardiac tamponade.  6. The  mitral valve is normal in structure. Mild to moderate mitral valve  regurgitation. No evidence of mitral stenosis.  7. Tricuspid valve regurgitation is moderate.  8. The aortic valve is tricuspid. Aortic valve regurgitation is not  visualized.  9. The inferior vena cava is dilated in size with <50% respiratory  variability, suggesting right atrial pressure of 15 mmHg.   Conclusion(s)/Recommendation(s): Severely reduced LVEF, grade 3 diastolic  dysfunction with nearly nonexistent A wave and elevated LVEDP. Small to  moderate circumferential pericardial effusion without echo tamponade.  Chronic appearing apical LV thrombus.   Patient Profile     58 y.o. male polysubstance abuse schizoaffective disorder here with acute on chronic systolic heart failure  Assessment & Plan    Acute on chronic systolic heart failure-stage D, NYHA class IV-EF less than 20% with chronic appearing LV apical thrombus   -Had extensive conversation yesterday, see  prior progress note, regarding perceived Lasix allergy.  Appreciate pharmacy team, primary team's assistance.  Medical judgment is that he does not in fact have a loop diuretic or Lasix allergy given his prior use over the past several admissions without difficulty.  Schizophrenia, paranoia may be playing a role.  -After extensive counseling to patient, he agreed to take torsemide even though this is in the same classification as furosemide.  He is tolerating the torsemide well p.o.  Put out 3.6 L yesterday.  Spoke with patient about this.  We will go ahead and take Lasix off of his allergy list.  To help him feel better we will continue with torsemide however.  -Unable to previously prescribed goal-directed therapy secondary to noncompliance, lack of follow-up. -Agree with hydralazine isosorbide. -Did not take warfarin reliably for LV thrombus previously. -Not a candidate for ICD secondary to noncompliance  Schizophrenia -Noncompliance, paranoia.  Certainly playing a role in his overall ability to care for himself.  Nonsustained ventricular tachycardia -24 beats.  Continue with diuresis and optimization of underlying chronic systolic heart failure.  Clearly still has edema.  Neck swelling -Appears improved today, previously described as right-sided unilateral no edema of tongue or oral mucosa no wheezing no signs of anaphylaxis. -Encourage work-up per Dr. Mayford Knife, primary team, if felt necessary.    Donato Schultz, MD      For questions or updates, please contact [facility] Please consult [email or web address] for contact info under        Signed, Donato Schultz, MD  03/01/2020, 10:06 AM

## 2020-03-02 LAB — MAGNESIUM: Magnesium: 1.8 mg/dL (ref 1.7–2.4)

## 2020-03-02 LAB — BASIC METABOLIC PANEL
Anion gap: 15 (ref 5–15)
BUN: 17 mg/dL (ref 8–23)
CO2: 29 mmol/L (ref 22–32)
Calcium: 9.3 mg/dL (ref 8.9–10.3)
Chloride: 97 mmol/L — ABNORMAL LOW (ref 98–111)
Creatinine, Ser: 1.48 mg/dL — ABNORMAL HIGH (ref 0.61–1.24)
GFR, Estimated: 53 mL/min — ABNORMAL LOW (ref >60)
Glucose, Bld: 99 mg/dL (ref 70–99)
Potassium: 3.9 mmol/L (ref 3.5–5.1)
Sodium: 141 mmol/L (ref 135–145)

## 2020-03-02 MED ORDER — OXYCODONE HCL 5 MG PO TABS
10.0000 mg | ORAL_TABLET | Freq: Once | ORAL | Status: AC
  Administered 2020-03-02: 10 mg via ORAL
  Filled 2020-03-02: qty 2

## 2020-03-02 MED ORDER — MAGNESIUM OXIDE 400 (241.3 MG) MG PO TABS
400.0000 mg | ORAL_TABLET | Freq: Every day | ORAL | Status: DC
  Administered 2020-03-02 – 2020-03-03 (×2): 400 mg via ORAL
  Filled 2020-03-02 (×2): qty 1

## 2020-03-02 NOTE — Progress Notes (Signed)
Mobility Specialist Criteria Algorithm Info.  Mobility Team: [facility] elevated:Self regulated Activity: Ambulated in hall Range of motion: Active;All extremities Level of assistance: Modified independent, requires aide device or extra time Assistive device: Cane Minutes sitting in chair:  Minutes stood: 5 minutes Minutes ambulated: 5 minutes Distance ambulated (ft): 500 ft Mobility response: Tolerated well Bed Position: Semi-fowlers  Received pt in bed eager to participate in mobility. Pt reported ambulating independently with cane prior to admission but has since lost it. Provided pt with cane to keep. Ambulated in hallway 500 feet Mod I w/cane. Tolerated ambulation well with steady gait and no complaints. Is now lying back in bed with all needs met.   03/02/2020 11:24 AM

## 2020-03-02 NOTE — Progress Notes (Signed)
Per CCMD patient was brady down to 40's yesterday and this morning twice, non-sustaned.Pt asymptomatic, asleep, VS stable. Notified PA [facility].

## 2020-03-02 NOTE — Progress Notes (Signed)
Progress Note  Patient Name: Allen Mayer Date of Encounter: 03/02/2020  Primary Cardiologist:   Rollene Rotunda, MD   Subjective   He says that he is breathing almost back to baseline.   Inpatient Medications    Scheduled Meds: . ARIPiprazole  10 mg Oral BID  . budesonide (PULMICORT) nebulizer solution  0.25 mg Nebulization BID  . citalopram  20 mg Oral Daily  . enoxaparin (LOVENOX) injection  40 mg Subcutaneous Q24H  . hydrALAZINE  25 mg Oral Q8H  . isosorbide mononitrate  15 mg Oral Daily  . polyethylene glycol  17 g Oral Daily  . sodium chloride flush  3 mL Intravenous Q12H  . torsemide  40 mg Oral Daily   Continuous Infusions: . sodium chloride     PRN Meds: sodium chloride, acetaminophen, albuterol, diphenhydrAMINE, EPINEPHrine, morphine injection, sodium chloride flush   Vital Signs    Vitals:   03/01/20 1202 03/01/20 1956 03/02/20 0500 03/02/20 0507  BP: (!) 127/96 124/87  133/88  Pulse: 93 88  84  Resp: 18 20  18   Temp: 97.8 F (36.6 C) 98.5 F (36.9 C)  97.6 F (36.4 C)  TempSrc: Oral Oral  Oral  SpO2: 97% 99%  99%  Weight:   85.5 kg   Height:        Intake/Output Summary (Last 24 hours) at 03/02/2020 0743 Last data filed at 03/02/2020 0500 Gross per 24 hour  Intake 1080 ml  Output 6000 ml  Net -4920 ml   Filed Weights   02/29/20 0600 03/01/20 0500 03/02/20 0500  Weight: 97.1 kg 87.6 kg 85.5 kg    Telemetry    NSR, SB - Personally Reviewed  ECG    NA - Personally Reviewed  Physical Exam   GEN: No acute distress.   Neck: No  JVD Cardiac: RRR, no murmurs, rubs, or gallops.  Respiratory: Clear to auscultation bilaterally. GI: Soft, nontender, non-distended  MS:   Trace pretibial  edema; No deformity. Neuro:  Nonfocal  Psych: Normal affect   Labs    Chemistry Recent Labs  Lab 02/29/20 0414 03/01/20 0453 03/02/20 0458  NA 139 142 141  K 4.1 3.9 3.9  CL 105 104 97*  CO2 26 28 29   GLUCOSE 118* 88 99  BUN 20 17 17    CREATININE 1.61* 1.48* 1.48*  CALCIUM 8.8* 9.4 9.3  GFRNONAA 48* 53* 53*  ANIONGAP 8 10 15      Hematology Recent Labs  Lab 02/28/20 0725 02/28/20 1743  WBC 6.3 5.3  RBC 5.12 5.22  HGB 14.9 15.0  HCT 47.3 48.0  MCV 92.4 92.0  MCH 29.1 28.7  MCHC 31.5 31.3  RDW 15.8* 15.9*  PLT 201 193    Cardiac EnzymesNo results for input(s): TROPONINI in the last 168 hours. No results for input(s): TROPIPOC in the last 168 hours.   BNP Recent Labs  Lab 02/28/20 0725  BNP 2,265.7*     DDimer No results for input(s): DDIMER in the last 168 hours.   Radiology    CT SOFT TISSUE NECK WO CONTRAST  Result Date: 03/01/2020 CLINICAL DATA:  59 year old male with neck swelling for several days greater on the right. Query lymphadenopathy. EXAM: CT NECK WITHOUT CONTRAST TECHNIQUE: Multidetector CT imaging of the neck was performed following the standard protocol without intravenous contrast. COMPARISON:  Chest radiographs 02/28/2020 and earlier. CTA chest 12/11/2017 FINDINGS: Pharynx and larynx: Pharyngeal soft tissue contours are within normal limits. There is a small retropharyngeal effusion (series 3,  image 41) and mild bilateral parapharyngeal soft tissue stranding, with similar generalized subcutaneous stranding throughout the visible neck and upper chest. The glottis is closed. In the absence of contrast no laryngeal mass is evident. Negative subglottic trachea. Salivary glands: Sublingual space, noncontrast submandibular glands and parotid glands appear within normal limits. Mild sublingual space inflammatory stranding similar to that noted diffusely. Thyroid: Negative noncontrast appearance. Lymph nodes: Mild bilateral generalized soft tissue stranding in the superficial and deep soft tissue spaces of the neck. No evidence of superimposed cervical lymphadenopathy on this noncontrast exam. Note that the generalized stranding relatively spares a small left suboccipital lipoma on series 3, image 6  (13 x 33 x 21 mm AP by transverse by CC). Vascular: Vascular patency is not evaluated in the absence of IV contrast. Mild cervical carotid calcified atherosclerosis on the right. Limited intracranial: Mild Calcified atherosclerosis at the skull base. Negative visible noncontrast brain parenchyma. Visualized orbits: Negative. Mastoids and visualized paranasal sinuses: Clear aside from trace mucosal thickening in the right sphenoid sinus. Skeleton: Occasional carious dentition. Widespread degenerative changes in the cervical spine. No acute osseous abnormality identified. Upper chest: Questionable trace layering pleural effusions. Mild dependent atelectasis and mild septal thickening in the upper lungs. A small left upper lobe subpleural lung nodule on series 4, image 46 is stable since 2019 and benign. Mild chronic subpleural scarring in the right upper lobe is also stable on series 4, image 55 today. Visible carina is patent. No superior mediastinal lymphadenopathy is evident. Mildly dilated air and fluid containing esophagus appears stable since 2019. IMPRESSION: 1. Negative for lymphadenopathy. Positive for generalized soft tissue edema in the bilateral neck including a small retropharyngeal space effusion. The appearance favors generalized 3rd spacing of fluid. 2. Possible trace pleural effusions with pulmonary septal thickening but no overt edema in the visible upper lungs. Electronically Signed   By: Odessa Fleming M.D.   On: 03/01/2020 09:52    Cardiac Studies   Echocardiogram 10/28/2019:   1. There appears to be likely chronic LV thrombus at the apex. Patient  without IV access, so contrast not given. . Left ventricular ejection  fraction, by estimation, is <20%. The left ventricle has severely  decreased function. The left ventricle  demonstrates global hypokinesis. The left ventricular internal cavity size  was severely dilated. Left ventricular diastolic parameters are consistent  with Grade III  diastolic dysfunction (restrictive). Elevated left  ventricular end-diastolic pressure.  2. Right ventricular systolic function is moderately reduced. The right  ventricular size is moderately enlarged. There is moderately elevated  pulmonary artery systolic pressure.  3. Left atrial size was mild to moderately dilated.  4. Right atrial size was moderately dilated.  5. Small circumferential pericardial effusion, bordering on moderate size  posterior to RV. No echo evidence of tamponade.. The pericardial effusion  is circumferential. There is no evidence of cardiac tamponade.  6. The mitral valve is normal in structure. Mild to moderate mitral valve  regurgitation. No evidence of mitral stenosis.  7. Tricuspid valve regurgitation is moderate.  8. The aortic valve is tricuspid. Aortic valve regurgitation is not  visualized.  9. The inferior vena cava is dilated in size with <50% respiratory  variability, suggesting right atrial pressure of 15 mmHg.   Conclusion(s)/Recommendation(s): Severely reduced LVEF, grade 3 diastolic  dysfunction with nearly nonexistent A wave and elevated LVEDP. Small to  moderate circumferential pericardial effusion without echo tamponade.  Chronic appearing apical LV thrombus.    Patient Profile  59 y.o. male with polysubstance abuse schizoaffective disorder here with acute on chronic systolic heart failure  Assessment & Plan    ACUTE ON CHRONIC SYSTOLIC HF:   Net negative 10 liters this admission.  Continue current therapy.  We will schedule follow up with an APP.  However, he never comes to appts with [place].    NSVT:    SB on tele.  No symptoms.   Avoiding beta blockers.  Not an ICD candidate.   CKD IIIB:  Creat is stable and tolerating diuresis.    For questions or updates, please contact [facility] Please consult [email or web address] for contact info under Cardiology/STEMI.   Signed, Rollene Rotunda, MD  03/02/2020, 7:43 AM

## 2020-03-02 NOTE — Progress Notes (Signed)
17 beats of VT followed by 15 beats after few seconds. Pt asymptomatic, resting . PA [facility] aware. VS Stable.

## 2020-03-02 NOTE — Progress Notes (Addendum)
Subjective:   Hospital day: 2  Overnight event: No acute event  Patient is seen at bedside.  He states that his breathing has improved and he is able to lay flat.  No other questions or concerns.  Still working on transition of care for safe discharge plan.  Discussed the importance of regular follow-up with cardiology.  Objective:  Vital signs in last 24 hours: Vitals:   03/01/20 1202 03/01/20 1956 03/02/20 0500 03/02/20 0507  BP: (!) 127/96 124/87  133/88  Pulse: 93 88  84  Resp: 18 20  18   Temp: 97.8 F (36.6 C) 98.5 F (36.9 C)  97.6 F (36.4 C)  TempSrc: Oral Oral  Oral  SpO2: 97% 99%  99%  Weight:   85.5 kg   Height:       CBC Latest Ref Rng & Units 02/28/2020 02/28/2020 02/09/2020  WBC 4.0 - 10.5 K/uL 5.3 6.3 5.8  Hemoglobin 13.0 - 17.0 g/dL 02/11/2020 44.0 10.2  Hematocrit 39 - 52 % 48.0 47.3 47.2  Platelets 150 - 400 K/uL 193 201 186   CMP Latest Ref Rng & Units 03/02/2020 03/01/2020 02/29/2020  Glucose 70 - 99 mg/dL 99 88 03/02/2020)  BUN 8 - 23 mg/dL 17 17 20   Creatinine 0.61 - 1.24 mg/dL 366(Y) ) 4.03(K)  Sodium 135 - 145 mmol/L 141 142 139  Potassium 3.5 - 5.1 mmol/L 3.9 3.9 4.1  Chloride 98 - 111 mmol/L 97(L) 104 105  CO2 22 - 32 mmol/L 29 28 26   Calcium 8.9 - 10.3 mg/dL 9.3 9.4 7.42(V)  Total Protein 6.5 - 8.1 g/dL - - -  Total Bilirubin 0.3 - 1.2 mg/dL - - -  Alkaline Phos 38 - 126 U/L - - -  AST 15 - 41 U/L - - -  ALT 0 - 44 U/L - - -     Physical Exam  Physical Exam Constitutional:      General: He is not in acute distress. HENT:     Head: Normocephalic.  Eyes:     General:        Right eye: No discharge.        Left eye: No discharge.  Cardiovascular:     Rate and Rhythm: Normal rate and regular rhythm.     Comments: + JVD Pulmonary:     Effort: No respiratory distress.     Breath sounds: Normal breath sounds.  Musculoskeletal:     Cervical back: Normal range of motion.     Right lower leg: Edema (+1) present.     Left lower leg:  Edema (+1) present.  Neurological:     Mental Status: He is alert.  Psychiatric:        Mood and Affect: Mood normal.     Assessment/Plan: Allen Mayer is a 59 y.o. male with hx of heart failure with NICM (EF<20%, G3DD), LV thrombus, and ongoing cocaine use who presented with shortness of breath and chest pain which resolved in the ED. Continue diuresis with Torsemide  Active Problems:   Essential hypertension, benign   LV (left ventricular) mural thrombus   Acute exacerbation of congestive heart failure (HCC)  Chest pain (resolved on admission) -No ischemic changes appreciable on EKG. trops 109>96.  -Last left and right heart cath in 2019 showed clean coronary arteries Acute on chronic heart failure with reduced EF (EF <20%, G3DD) (NYHA class IV) secondary to medication non-compliance and ongoing cocaine use.   -per cardiology, not candidate for ICD or life  vest in the setting of non-compliance LV thrombus--pt refusing anticoagulation. Prescribed warfarin however INR 1.2 today, suggesting he has not been taking it. I agree with cardiology that warfarin is not feasible in the setting of poor follow up and non-compliance. Plan:  -no allergic reaction noted from torsemide trial--lasix removed from allergy list. -Good diuresis in last 24h with 6L urine output. Continue Torsemide 40 mg daily.  -Continue hydralazine and imdur  -no ACE/ARB in setting of pt reported "facial swelling" -avoid beta blockade in the setting of ongoing cocaine use -not candidate for ICD or life vest in the setting of non-compliance -Pending transition of care and social work to have a safe discharge plan.  I discussed the importance of regular follow-up with cardiology given the state of his heart.     Bradycardia Patient had a few episodes of bradycardia overnight on telemetry.  His PR interval is borderline prolonged.  Patient is not on any beta-blocker -Continue to monitor telemetry -Avoid beta-blockers  in the setting of ongoing cocaine use and bradycardia   Neck swelling.  Angioedema? CT soft tissue neck only shows generalized tissue edema. No abscess or lymphadenopathy. -Holding ACE/ARB for possible angioedema -Continue Torsemide. Closely monitor     Polysubstance abuse. UDS + cocaine. Recommend cessation. CKD 3a. Stable. Daily BMP to monitor electrolytes/renal function with diuresis.  COPD. Continue home inhalers.  Chronic bipolar disorder: continue abilify, celexa   Prior to Admission Living Arrangement: home Anticipated Discharge Location: To be determined Barriers to Discharge: medical management and transitional care Dispo: per cardiology  Doran Stabler, DO 03/02/2020, 6:41 AM Pager: [phone] After 5pm on weekdays and 1pm on weekends: On Call pager [phone]

## 2020-03-02 NOTE — TOC Initial Note (Signed)
Transition of Care [facility]) - Initial/Assessment Note    Patient Details  Name: Allen Mayer MRN: [id number] Date of Birth: 07-Aug-1960  [facility] CM/SW Contact:    Carmina Miller, LCSWA Phone Number: 03/02/2020, 12:47 PM  Clinical Narrative:                 CSW received message from RN to reach out to [place] CSW's in reference to securing housing for pt. CSW had to leave vm for [facility]. CSW spoke with pt and fiance Shirlene Harrington, stated that pt is homeless and she is also (she stays with her mother but her mother will not allow pt to stay in the home). Pt states he does not have anywhere else to go and doesn't have any funds for a hotel. Pt's fiance called [facility] CSW [name] and allowed CSW (me) to speak with her. CSW [name] stated [employer] may not have a bed until next week. [name] stated pt may have to stay at the [facility] until [facility] has availability. CSW [name] stated that there may be another option for pt (funding a hotel room) but shelter options would have to be exhausted. CSW [name] advised Shirlene that she would need to call [facility] to see if there was availability. Shirlene called [employer] and was told that there were no beds available in the presence of CSW. [employer] representative stated that pt may be able to get assistane from the [facility] as they may be able to assist homeless veterans. Shirlene was advised to call [name] back and relay this information.         Patient Goals and CMS Choice        Expected Discharge Plan and Services                                                Prior Living Arrangements/Services                       Activities of Daily Living      Permission Sought/Granted                  Emotional Assessment              Admission diagnosis:  Acute exacerbation of congestive heart failure (HCC) [I50.9] Subtherapeutic international normalized ratio (INR)  [R79.1] Chest pain, unspecified type [R07.9] Patient Active Problem List   Diagnosis Date Noted   Acute exacerbation of congestive heart failure (HCC) 02/28/2020   Chronic systolic HF (heart failure) (HCC) 02/23/2020   Long term (current) use of anticoagulants 11/14/2019   RUQ pain    Acute on chronic combined systolic and diastolic CHF (congestive heart failure) (HCC) 10/28/2019   LV (left ventricular) mural thrombus    Acute exacerbation of CHF (congestive heart failure) (HCC) 10/27/2019   Acute respiratory failure with hypoxia (HCC) 10/27/2019   SOB (shortness of breath)    Coronary artery calcification seen on CAT scan    DCM (dilated cardiomyopathy) (HCC)    Acute CHF (congestive heart failure) (HCC) 12/11/2017   Elevated troponin 12/11/2017   COPD (chronic obstructive pulmonary disease) (HCC) 12/11/2017   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 [place] [address] Phone: [phone] Fax: [phone]     Social Determinants of Health (SDOH) Interventions    Readmission Risk Interventions No flowsheet data found.

## 2020-03-02 NOTE — Progress Notes (Signed)
Paged by nursing that patient had a 17 beat run followed by a 15 beat run of VT. Pt asymptomatic and resting at the time. Mg 2 days ago was 1.8. Will repeat lab and replace as needed. Will given PO Magox in the meantime.

## 2020-03-02 NOTE — Progress Notes (Signed)
Received phone call from [place], [place] from [place] who is working with Mr. Allen Mayer to help with housing. Would like to speak with our SW. Phone number and info given to progression nurse to inform SW.

## 2020-03-02 NOTE — Progress Notes (Signed)
PT Cancellation Note  Patient Details Name: Allen Mayer MRN: [id number] DOB: May 13, 1960   Cancelled Treatment:    Reason Eval/Treat Not Completed: Other (comment).  Pt is shaving, then bathing and finally in BR and wife refused therapy for him.  Retry at another time.   Ivar Drape 03/02/2020, 5:01 PM   Samul Dada, PT MS Acute Rehab Dept. Number: [facility] [id number] and [facility] [phone]

## 2020-03-03 LAB — BASIC METABOLIC PANEL
Anion gap: 14 (ref 5–15)
BUN: 19 mg/dL (ref 8–23)
CO2: 31 mmol/L (ref 22–32)
Calcium: 9 mg/dL (ref 8.9–10.3)
Chloride: 96 mmol/L — ABNORMAL LOW (ref 98–111)
Creatinine, Ser: 1.47 mg/dL — ABNORMAL HIGH (ref 0.61–1.24)
GFR, Estimated: 54 mL/min — ABNORMAL LOW (ref >60)
Glucose, Bld: 110 mg/dL — ABNORMAL HIGH (ref 70–99)
Potassium: 3.7 mmol/L (ref 3.5–5.1)
Sodium: 141 mmol/L (ref 135–145)

## 2020-03-03 LAB — MAGNESIUM: Magnesium: 1.7 mg/dL (ref 1.7–2.4)

## 2020-03-03 MED ORDER — TORSEMIDE 20 MG PO TABS: 40.0000 mg | ORAL_TABLET | Freq: Every day | ORAL | 1 refills | Status: DC

## 2020-03-03 MED ORDER — HYDRALAZINE HCL 25 MG PO TABS: 25.0000 mg | ORAL_TABLET | Freq: Three times a day (TID) | ORAL | 1 refills | Status: DC

## 2020-03-03 MED ORDER — ISOSORBIDE MONONITRATE ER 30 MG PO TB24: 15.0000 mg | ORAL_TABLET | Freq: Every day | ORAL | 1 refills | Status: DC

## 2020-03-03 NOTE — Discharge Summary (Addendum)
Name: Allen Mayer MRN: [id number] DOB: 08-Jan-1961 59 y.o. PCP: Medicine, [facility]  Date of Admission: 02/28/2020  7:18 AM Date of Discharge:  03/03/2020 Attending Physician: Anne Shutter, MD  Discharge Diagnosis: 1.  Acute on chronic systolic heart failure 2.  Chest pain 3.  Left ventricular thrombus 4.  CKD 3 5.  Polysubstance use  Discharge Medications: Allergies as of 03/03/2020   No Active Allergies      Medication List     STOP taking these medications    furosemide 40 MG tablet Commonly known as: LASIX   lisinopril-hydrochlorothiazide 20-25 MG tablet Commonly known as: ZESTORETIC   losartan 50 MG tablet Commonly known as: COZAAR   paliperidone 6 MG 24 hr tablet Commonly known as: INVEGA   pantoprazole 20 MG tablet Commonly known as: PROTONIX   warfarin 2.5 MG tablet Commonly known as: Coumadin       TAKE these medications    albuterol 108 (90 Base) MCG/ACT inhaler Commonly known as: VENTOLIN HFA Inhale 2 puffs into the lungs every 4 (four) hours as needed for wheezing or shortness of breath.   ARIPiprazole 10 MG tablet Commonly known as: ABILIFY Take 10 mg by mouth 2 (two) times daily.   baclofen 10 MG tablet Commonly known as: LIORESAL Take 10 mg by mouth 3 (three) times daily as needed for muscle spasms. LF 10/09/19 30DS   benztropine 1 MG tablet Commonly known as: COGENTIN Take 1 tablet (1 mg total) by mouth at bedtime.   carvedilol 6.25 MG tablet Commonly known as: COREG Take 1 tablet (6.25 mg total) by mouth 2 (two) times daily.   citalopram 20 MG tablet Commonly known as: CELEXA Take 1 tablet (20 mg total) by mouth daily.   Flovent HFA 110 MCG/ACT inhaler Generic drug: fluticasone Inhale 2 puffs into the lungs 2 (two) times daily.   gabapentin 400 MG capsule Commonly known as: NEURONTIN Take 1 capsule (400 mg total) by mouth daily as needed (nerve pain).   hydrALAZINE 25 MG tablet Commonly  known as: APRESOLINE Take 1 tablet (25 mg total) by mouth every 8 (eight) hours.   isosorbide mononitrate 30 MG 24 hr tablet Commonly known as: IMDUR Take 0.5 tablets (15 mg total) by mouth daily. Start taking on: March 04, 2020   nitroGLYCERIN 0.4 MG SL tablet Commonly known as: NITROSTAT Place 1 tablet (0.4 mg total) under the tongue every 5 (five) minutes as needed for chest pain.   oxyCODONE 5 MG immediate release tablet Commonly known as: Oxy IR/ROXICODONE Take 1 tablet (5 mg total) by mouth 3 (three) times daily as needed (pain). What changed: how much to take   torsemide 20 MG tablet Commonly known as: DEMADEX Take 2 tablets (40 mg total) by mouth daily. Start taking on: March 04, 2020   Vitamin D (Ergocalciferol) 1.25 MG (50000 UNIT) Caps capsule Commonly known as: DRISDOL Take 50,000 Units by mouth once a week.        Disposition and follow-up:   Allen Mayer was discharged from [facility] in Stable condition.  At the hospital follow up visit please address:  1.   Cardiology -Follow-up on medication adherence, avoidance of cocaine -Follow-up on volume status, consider titration of torsemide -If adherent to medication regimen, consider ICD and anticoagulation for LV thrombus  Primary care physician -Follow-up on medication adherence, avoidance of cocaine  2.  Labs / imaging needed at time of follow-up: BMP  3.  Pending labs/ test  needing follow-up: NA  Follow-up Appointments:  Follow-up Information     Rollene Rotunda, MD .   Specialty: Cardiology Contact information: [address] [place] [address] [phone]                 Hospital Course by problem list: 1.  Acute on chronic systolic heart failure Chest pain LV thrombus Patient was admitted for chest pain which was resolved with nitroglycerin.  Low suspicion for ACS given troponin trending down 109-96 and no ischemic changes appreciable  on EKG.  Patient had a left and right heart cath in 2019 which showed clean coronary arteries.  Echocardiogram in July 2021 showed EF < 20% with global hypokinesis and grade 3 diastolic dysfunction. No ACE/ARB in setting of pt reported "facial swelling". Avoid beta blockade in the setting of ongoing cocaine use.  He is not a candidate for ICD or LifeVest due to history of poor compliance and follow-up.  Patient was started on Lasix initially but developed neck swelling overnight.  No evidence of anaphylactic reaction.  Torsemide was started and patient has been tolerating well.  Patient was effectively diuresed with -14 L cumulative.  His weight dropped from 214 down to 181.  Imdur and hydralazine was started.  Patient also have history of LV thrombus and was started on warfarin however discontinued due to missing INR appointments.  His creatinine was elevated on admission and trending down back to baseline at 1.14.  Patient is instructed to follow-up with cardiology on April 02, 2020.  He is also will be beneficial for counseling on substance use cessation.  In the end, he would benefit from much more aggressive management of his multiple comorbidities, but options are limited with inconsistent follow-up and medication adherence as well as ongoing substance use   Discharge Vitals:   BP 120/87 (BP Location: Right Arm)   Pulse 94   Temp 97.6 F (36.4 C) (Oral)   Resp 20   Ht 6' (1.829 m)   Wt 82.3 kg   SpO2 98%   BMI 24.61 kg/m   Pertinent Labs, Studies, and Procedures:  CBC Latest Ref Rng & Units 02/28/2020 02/28/2020 02/09/2020  WBC 4.0 - 10.5 K/uL 5.3 6.3 5.8  Hemoglobin 13.0 - 17.0 g/dL 21.3 08.6 57.8  Hematocrit 39 - 52 % 48.0 47.3 47.2  Platelets 150 - 400 K/uL 193 201 186   CMP Latest Ref Rng & Units 03/03/2020 03/02/2020 03/01/2020  Glucose 70 - 99 mg/dL 469(G) 99 88  BUN 8 - 23 mg/dL 19 17 17   Creatinine 0.61 - 1.24 mg/dL ) 2.95(M) 8.41(L)  Sodium 135 - 145 mmol/L 141 141  142  Potassium 3.5 - 5.1 mmol/L 3.7 3.9 3.9  Chloride 98 - 111 mmol/L 96(L) 97(L) 104  CO2 22 - 32 mmol/L 31 29 28   Calcium 8.9 - 10.3 mg/dL 9.0 9.3 9.4  Total Protein 6.5 - 8.1 g/dL - - -  Total Bilirubin 0.3 - 1.2 mg/dL - - -  Alkaline Phos 38 - 126 U/L - - -  AST 15 - 41 U/L - - -  ALT 0 - 44 U/L - - -    DG Chest 2 View  Result Date: 02/28/2020 CLINICAL DATA:  Shortness of breath.  Chest pain.  History of CHF. EXAM: CHEST - 2 VIEW COMPARISON:  None. FINDINGS: Similar enlarged cardiac silhouette. Both lungs are clear. No visible pleural effusions or pneumothorax. The visualized skeletal structures are unremarkable. IMPRESSION: Similar cardiomegaly without acute cardiopulmonary  disease. Electronically Signed   By: Feliberto Harts MD   On: 02/28/2020 07:51   Discharge Instructions: Discharge Instructions     Call MD for:  persistant nausea and vomiting   Complete by: As directed    Call MD for:  severe uncontrolled pain   Complete by: As directed    Diet - low sodium heart healthy   Complete by: As directed    Discharge instructions   Complete by: As directed    Mr. Marvia Pickles, it is a pleasure taking care of you during this admission.   -Please follow-up with the heart doctor.  Your appointment is set on April 02, 2020.  It is very important that you follow-up with this appointment for medication adjustment. -You can also reach out to the [place] to find a primary care physician -Please start taking the new medications as instructed  Take care   Increase activity slowly   Complete by: As directed        Signed: Doran Stabler, DO 03/03/2020, 4:40 PM   Pager: [phone]

## 2020-03-03 NOTE — TOC Progression Note (Signed)
Transition of Care [facility]) - Progression Note    Patient Details  Name: Bricyn Labrada MRN: [id number] Date of Birth: 1961-01-26  Transition of Care [facility]) CM/SW [place], [place] Phone Number: 03/03/2020, 3:12 PM  Clinical Narrative:    CSW met with pt and his fiance, Shirlene, in reference to [employer]. Shirlene called [facility]neiders who confirmed that pt will be able to go to a hotel paid for by the [employer] until pt can go to the [employer]. [facility] Apolonio Schneiders stated that she is waiting on a phone call that the room is ready and asked it pt was dc today. CSW reached out to MD who confirmed that pt was ready to dc. CSW requested [facility] Apolonio Schneiders keep CSW updated on status of hotel room.         Expected Discharge Plan and Services                                                 Social Determinants of Health (SDOH) Interventions    Readmission Risk Interventions No flowsheet data found.

## 2020-03-03 NOTE — Progress Notes (Signed)
Progress Note  Patient Name: Allen Mayer Date of Encounter: 03/03/2020  Primary Cardiologist:   Rollene Rotunda, MD   Subjective   Run of NSVT without symptoms yesterday.  Ambulated in the hallway.  He thinks that his breathing is better.  No pain.   Inpatient Medications    Scheduled Meds: . ARIPiprazole  10 mg Oral BID  . budesonide (PULMICORT) nebulizer solution  0.25 mg Nebulization BID  . citalopram  20 mg Oral Daily  . enoxaparin (LOVENOX) injection  40 mg Subcutaneous Q24H  . hydrALAZINE  25 mg Oral Q8H  . isosorbide mononitrate  15 mg Oral Daily  . magnesium oxide  400 mg Oral Daily  . polyethylene glycol  17 g Oral Daily  . sodium chloride flush  3 mL Intravenous Q12H  . torsemide  40 mg Oral Daily   Continuous Infusions: . sodium chloride     PRN Meds: sodium chloride, acetaminophen, albuterol, diphenhydrAMINE, EPINEPHrine, morphine injection, sodium chloride flush   Vital Signs    Vitals:   03/02/20 1942 03/02/20 2023 03/03/20 0432 03/03/20 0434  BP: 135/75  106/81   Pulse: (!) 54  (!) 55   Resp: 16  17   Temp: 98.1 F (36.7 C)  97.7 F (36.5 C)   TempSrc: Oral  Oral   SpO2: 94% 100% 95%   Weight:    82.3 kg  Height:        Intake/Output Summary (Last 24 hours) at 03/03/2020 0806 Last data filed at 03/03/2020 0300 Gross per 24 hour  Intake 840 ml  Output 3600 ml  Net -2760 ml   Filed Weights   03/01/20 0500 03/02/20 0500 03/03/20 0434  Weight: 87.6 kg 85.5 kg 82.3 kg    Telemetry    NSR, NSVT - Personally Reviewed  ECG    NA - Personally Reviewed  Physical Exam   GEN: No  acute distress.   Neck: No  JVD Cardiac: RRR, no murmurs, rubs, or gallops.  Respiratory: Clear  to auscultation bilaterally. GI: Soft, nontender, non-distended, normal bowel sounds  MS:  Trace edema; No deformity. Neuro:   Nonfocal  Psych: Oriented and appropriate    Labs    Chemistry Recent Labs  Lab 03/01/20 0453 03/02/20 0458 03/03/20 0255   NA 142 141 141  K 3.9 3.9 3.7  CL 104 97* 96*  CO2 28 29 31   GLUCOSE 88 99 110*  BUN 17 17 19   CREATININE 1.48* 1.48* 1.47*  CALCIUM 9.4 9.3 9.0  GFRNONAA 53* 53* 54*  ANIONGAP 10 15 14      Hematology Recent Labs  Lab 02/28/20 0725 02/28/20 1743  WBC 6.3 5.3  RBC 5.12 5.22  HGB 14.9 15.0  HCT 47.3 48.0  MCV 92.4 92.0  MCH 29.1 28.7  MCHC 31.5 31.3  RDW 15.8* 15.9*  PLT 201 193    Cardiac EnzymesNo results for input(s): TROPONINI in the last 168 hours. No results for input(s): TROPIPOC in the last 168 hours.   BNP Recent Labs  Lab 02/28/20 0725  BNP 2,265.7*     DDimer No results for input(s): DDIMER in the last 168 hours.   Radiology    CT SOFT TISSUE NECK WO CONTRAST  Result Date: 03/01/2020 CLINICAL DATA:  59 year old male with neck swelling for several days greater on the right. Query lymphadenopathy. EXAM: CT NECK WITHOUT CONTRAST TECHNIQUE: Multidetector CT imaging of the neck was performed following the standard protocol without intravenous contrast. COMPARISON:  Chest radiographs 02/28/2020 and  earlier. CTA chest 12/11/2017 FINDINGS: Pharynx and larynx: Pharyngeal soft tissue contours are within normal limits. There is a small retropharyngeal effusion (series 3, image 41) and mild bilateral parapharyngeal soft tissue stranding, with similar generalized subcutaneous stranding throughout the visible neck and upper chest. The glottis is closed. In the absence of contrast no laryngeal mass is evident. Negative subglottic trachea. Salivary glands: Sublingual space, noncontrast submandibular glands and parotid glands appear within normal limits. Mild sublingual space inflammatory stranding similar to that noted diffusely. Thyroid: Negative noncontrast appearance. Lymph nodes: Mild bilateral generalized soft tissue stranding in the superficial and deep soft tissue spaces of the neck. No evidence of superimposed cervical lymphadenopathy on this noncontrast exam. Note that  the generalized stranding relatively spares a small left suboccipital lipoma on series 3, image 6 (13 x 33 x 21 mm AP by transverse by CC). Vascular: Vascular patency is not evaluated in the absence of IV contrast. Mild cervical carotid calcified atherosclerosis on the right. Limited intracranial: Mild Calcified atherosclerosis at the skull base. Negative visible noncontrast brain parenchyma. Visualized orbits: Negative. Mastoids and visualized paranasal sinuses: Clear aside from trace mucosal thickening in the right sphenoid sinus. Skeleton: Occasional carious dentition. Widespread degenerative changes in the cervical spine. No acute osseous abnormality identified. Upper chest: Questionable trace layering pleural effusions. Mild dependent atelectasis and mild septal thickening in the upper lungs. A small left upper lobe subpleural lung nodule on series 4, image 46 is stable since 2019 and benign. Mild chronic subpleural scarring in the right upper lobe is also stable on series 4, image 55 today. Visible carina is patent. No superior mediastinal lymphadenopathy is evident. Mildly dilated air and fluid containing esophagus appears stable since 2019. IMPRESSION: 1. Negative for lymphadenopathy. Positive for generalized soft tissue edema in the bilateral neck including a small retropharyngeal space effusion. The appearance favors generalized 3rd spacing of fluid. 2. Possible trace pleural effusions with pulmonary septal thickening but no overt edema in the visible upper lungs. Electronically Signed   By: Odessa Fleming M.D.   On: 03/01/2020 09:52    Cardiac Studies   Echocardiogram 10/28/2019:   1. There appears to be likely chronic LV thrombus at the apex. Patient  without IV access, so contrast not given. . Left ventricular ejection  fraction, by estimation, is <20%. The left ventricle has severely  decreased function. The left ventricle  demonstrates global hypokinesis. The left ventricular internal cavity size   was severely dilated. Left ventricular diastolic parameters are consistent  with Grade III diastolic dysfunction (restrictive). Elevated left  ventricular end-diastolic pressure.  2. Right ventricular systolic function is moderately reduced. The right  ventricular size is moderately enlarged. There is moderately elevated  pulmonary artery systolic pressure.  3. Left atrial size was mild to moderately dilated.  4. Right atrial size was moderately dilated.  5. Small circumferential pericardial effusion, bordering on moderate size  posterior to RV. No echo evidence of tamponade.. The pericardial effusion  is circumferential. There is no evidence of cardiac tamponade.  6. The mitral valve is normal in structure. Mild to moderate mitral valve  regurgitation. No evidence of mitral stenosis.  7. Tricuspid valve regurgitation is moderate.  8. The aortic valve is tricuspid. Aortic valve regurgitation is not  visualized.  9. The inferior vena cava is dilated in size with <50% respiratory  variability, suggesting right atrial pressure of 15 mmHg.   Conclusion(s)/Recommendation(s): Severely reduced LVEF, grade 3 diastolic  dysfunction with nearly nonexistent A wave and elevated LVEDP.  Small to  moderate circumferential pericardial effusion without echo tamponade.  Chronic appearing apical LV thrombus.    Patient Profile     59 y.o. male with polysubstance abuse schizoaffective disorder here with acute on chronic systolic heart failure  Assessment & Plan    ACUTE ON CHRONIC SYSTOLIC HF:   Net negative 12.9 liters this admission.  Continue current therapy.    We have scheduled follow up with [place] in late Dec and we can titrate meds further if he presents for this follow up.   I had a discussion with the patient and his significant other that he has a high risk for progressive heart failure symptoms and progressing to death with a high risk of sudden death if he does not present for  appointments and take his meds and avoid cocaine.    NSVT:    No symptoms.  Not a candidate for an ICD with no compliance with follow up recently.  Unable to start beta blocker with bradycardia frequently on tele.  Continue current therapy.  Magnesium is in process.      CKD IIIB:  Creat is unchanged and tolerating diuresis.    For questions or updates, please contact [facility] Please consult [email or web address] for contact info under Cardiology/STEMI.   Signed, Rollene Rotunda, MD  03/03/2020, 8:06 AM

## 2020-03-03 NOTE — Progress Notes (Addendum)
Subjective:   Hospital day: 3  Overnight event: Patient had a 17 beats run followed by a 15 beats of VT. Mag 1.8 and replete.   Patient is seen at bedside.  He states that everything is going well with no acute complaints.  I had a long discussion with him regarding the importance of medication compliance and regular follow-up with cardiology.  Patient states that he will try to do all of that and will try to quit using cocaine.  Patient is working with the [employer] social worker Allen Mayer for housing placement.  Objective:  Vital signs in last 24 hours: Vitals:   03/02/20 1942 03/02/20 2023 03/03/20 0432 03/03/20 0434  BP: 135/75  106/81   Pulse: (!) 54  (!) 55   Resp: 16  17   Temp: 98.1 F (36.7 C)  97.7 F (36.5 C)   TempSrc: Oral  Oral   SpO2: 94% 100% 95%   Weight:    82.3 kg  Height:       CBC Latest Ref Rng & Units 02/28/2020 02/28/2020 02/09/2020  WBC 4.0 - 10.5 K/uL 5.3 6.3 5.8  Hemoglobin 13.0 - 17.0 g/dL 06.2 69.4 85.4  Hematocrit 39 - 52 % 48.0 47.3 47.2  Platelets 150 - 400 K/uL 193 201 186   CMP Latest Ref Rng & Units 03/03/2020 03/02/2020 03/01/2020  Glucose 70 - 99 mg/dL 627(O) 99 88  BUN 8 - 23 mg/dL 19 17 17   Creatinine 0.61 - 1.24 mg/dL ) 3.50(K) 9.38(H)  Sodium 135 - 145 mmol/L 141 141 142  Potassium 3.5 - 5.1 mmol/L 3.7 3.9 3.9  Chloride 98 - 111 mmol/L 96(L) 97(L) 104  CO2 22 - 32 mmol/L 31 29 28   Calcium 8.9 - 10.3 mg/dL 9.0 9.3 9.4  Total Protein 6.5 - 8.1 g/dL - - -  Total Bilirubin 0.3 - 1.2 mg/dL - - -  Alkaline Phos 38 - 126 U/L - - -  AST 15 - 41 U/L - - -  ALT 0 - 44 U/L - - -     Physical Exam  Physical Exam Constitutional:      General: He is not in acute distress. HENT:     Head: Normocephalic.  Eyes:     General:        Right eye: No discharge.        Left eye: No discharge.  Cardiovascular:     Rate and Rhythm: Normal rate and regular rhythm.     Comments: +JVD Pulmonary:     Effort: No respiratory distress.      Breath sounds: Normal breath sounds.  Abdominal:     General: Bowel sounds are normal.  Musculoskeletal:     Right lower leg: Edema (+1) present.     Left lower leg: Edema (+1) present.  Neurological:     Mental Status: He is alert.  Psychiatric:        Mood and Affect: Mood normal.     Assessment/Plan: Allen Mayer is a 59 y.o. male with hx of heart failure with NICM (EF<20%, G3DD), LV thrombus, and ongoing cocaine use who presented with shortness of breath and chest pain which resolved in the ED. Continue diuresis with Torsemide.  Pending dispo placement.  Principal Problem:   Acute on chronic combined systolic and diastolic CHF (congestive heart failure) (HCC) Active Problems:   Essential hypertension, benign   DCM (dilated cardiomyopathy) (HCC)   LV (left ventricular) mural thrombus   Acute exacerbation of congestive heart  failure (HCC)   Acute on chronic heart failure with reduced EF (EF <20%, G3DD) (NYHA class IV) secondary to medication non-compliance and ongoing cocaine use.   -per cardiology, not candidate for ICD or life vest in the setting of non-compliance LV thrombus -pt refusing anticoagulation and has not followed up with INR clinic. Agree with cardiology that warfarin is not feasible in the setting of poor follow up and non-compliance. Plan:  -Good diuresis in last 24h with 3600cc urine output. Continue Torsemide 40 mg daily.  -Continue hydralazine and imdur  -no ACE/ARB in setting of pt reported "facial swelling" -avoid beta blockade in the setting of ongoing cocaine use -not candidate for ICD or life vest in the setting of non-compliance -Patient was scheduled a follow-up appointment with cardiology in late December. -I have spoken spoke to the social worker and she states that patient and his fiance are working with the [place] CSW [place].  An option right now is to go to [facility] but they do not have a bed available until next week.  Pending funding for  hotel.  There may be another option per CSW but will need to [facility] with [facility].     Bradycardia Continues to have a few episodes of bradycardia overnight.  Labeled as first-degree heart block -Continue to monitor telemetry -Avoid beta-blockers in the setting of ongoing cocaine use and bradycardia     Polysubstance abuse. UDS + cocaine. Recommend cessation. CKD 3a. Stable.  Baseline creatinine 1.4.  Daily BMP to monitor electrolytes/renal function with diuresis.  COPD. Continue home inhalers.  Chronic bipolar disorder: continue abilify, celexa   Prior to Admission Living Arrangement: home Anticipated Discharge Location: To be determined Barriers to Discharge: medical management and transitional care Dispo: likely discharge today depending on housing  Doran Stabler, DO 03/03/2020, 5:56 AM Pager: [phone] After 5pm on weekdays and 1pm on weekends: On Call pager [phone]

## 2020-03-03 NOTE — Progress Notes (Signed)
PT Cancellation Note  Patient Details Name: Allen Mayer MRN: [id number] DOB: Sep 01, 1960   Cancelled Treatment:    Reason Eval/Treat Not Completed: Patient declined, no reason specified (pt stating he was tired).  Lillia Pauls, PT, DPT Acute Rehabilitation Services Pager [phone] Office [phone]    Norval Morton 03/03/2020, 4:15 PM

## 2020-03-03 NOTE — Progress Notes (Signed)
Patient lying in bed, A&O x 4 this morning. Respirations easy and unlabored, all lobes clear but slightly diminished. Abdomen soft and somewhat distended; patient reports small bowel movements the last several days, but "nothing good". He is currently receiving miralax and all quads have active BS. Patient utilizes the urinal and is encouraged to call for standby assist in walking to the bathroom for a BM. His gait is steady currently. BLE with trace edema only. Physicians spoke with patient at length this morning. Patient was able to accurately repeat to me what the conversation entailed. He understands that medication to treat his heart failure are limited due to his symptoms and/or side effects, as well as his follow up and substance abuse issues. He took full responsibility for this and acknowledged that he needs to "stop doin' what I'm doin'". However, he also does not realize the extent of his illness, as he stated he isn't near dying because he can still get up and move around without difficulty, doesn't feel he needs oxygen,etc.; he did not acknowledge the possibility of sudden death. Additionally, his significant other is insistent that he is not going to die, that he is going to change everything, that it will all be ok; in other words, denial of the seriousness of his condition and his likelihood for change. Offered support according to their needs, allowed time for questions. Call light is in reach, patient denies pain at this time. Will monitor.

## 2020-03-03 NOTE — Progress Notes (Signed)
Received call from central telemetry reporting Allen Mayer episode into the upper 30s. Went to patient's room, he was lying in bed, fully alert, "dancing". He denied feeling any different and stated he had been "bed dancing" for a few minutes. Central tele stated he went back down into the upper 30s while I was talking to him; again, patient remained completely asymptomatic. Central tele reports him going back up to low 100s within seconds. They will save the corresponding strip.

## 2020-03-04 ENCOUNTER — Telehealth: Payer: Self-pay | Admitting: *Deleted

## 2020-03-04 NOTE — Telephone Encounter (Signed)
Lovett Calender calls and states dr Cyndie Chime is not medicaid certified, nurse gave dr Sandre Kitty info for meds to be filled. Sending to doriss. And dr Cyndie Chime, dr Sandre Kitty

## 2020-03-04 NOTE — Telephone Encounter (Signed)
Thank you, hopefully that went through.

## 2020-03-24 ENCOUNTER — Emergency Department (HOSPITAL_COMMUNITY): Payer: Medicaid Other

## 2020-03-24 ENCOUNTER — Encounter (HOSPITAL_COMMUNITY): Payer: Self-pay | Admitting: Emergency Medicine

## 2020-03-24 ENCOUNTER — Emergency Department (HOSPITAL_COMMUNITY)
Admission: EM | Admit: 2020-03-24 | Discharge: 2020-03-24 | Disposition: A | Discharge status: Home / Self Care | Payer: Medicaid Other | Attending: Emergency Medicine | Admitting: Emergency Medicine

## 2020-03-24 DIAGNOSIS — I5043 Acute on chronic combined systolic (congestive) and diastolic (congestive) heart failure: Secondary | ICD-10-CM | POA: Insufficient documentation

## 2020-03-24 DIAGNOSIS — N183 Chronic kidney disease, stage 3 unspecified: Secondary | ICD-10-CM | POA: Insufficient documentation

## 2020-03-24 DIAGNOSIS — F17210 Nicotine dependence, cigarettes, uncomplicated: Secondary | ICD-10-CM | POA: Diagnosis not present

## 2020-03-24 DIAGNOSIS — R0789 Other chest pain: Secondary | ICD-10-CM | POA: Insufficient documentation

## 2020-03-24 DIAGNOSIS — Z79899 Other long term (current) drug therapy: Secondary | ICD-10-CM | POA: Insufficient documentation

## 2020-03-24 DIAGNOSIS — J449 Chronic obstructive pulmonary disease, unspecified: Secondary | ICD-10-CM | POA: Diagnosis not present

## 2020-03-24 DIAGNOSIS — I130 Hypertensive heart and chronic kidney disease with heart failure and stage 1 through stage 4 chronic kidney disease, or unspecified chronic kidney disease: Secondary | ICD-10-CM | POA: Insufficient documentation

## 2020-03-24 DIAGNOSIS — R0602 Shortness of breath: Secondary | ICD-10-CM | POA: Insufficient documentation

## 2020-03-24 DIAGNOSIS — J45909 Unspecified asthma, uncomplicated: Secondary | ICD-10-CM | POA: Diagnosis not present

## 2020-03-24 LAB — CBC
HCT: 44.6 % (ref 39.0–52.0)
Hemoglobin: 14 g/dL (ref 13.0–17.0)
MCH: 28.8 pg (ref 26.0–34.0)
MCHC: 31.4 g/dL (ref 30.0–36.0)
MCV: 91.8 fL (ref 80.0–100.0)
Platelets: 205 10*3/uL (ref 150–400)
RBC: 4.86 MIL/uL (ref 4.22–5.81)
RDW: 15.2 % (ref 11.5–15.5)
WBC: 4.8 10*3/uL (ref 4.0–10.5)
nRBC: 0 % (ref 0.0–0.2)

## 2020-03-24 LAB — BASIC METABOLIC PANEL
Anion gap: 14 (ref 5–15)
BUN: 25 mg/dL — ABNORMAL HIGH (ref 8–23)
CO2: 30 mmol/L (ref 22–32)
Calcium: 9.5 mg/dL (ref 8.9–10.3)
Chloride: 94 mmol/L — ABNORMAL LOW (ref 98–111)
Creatinine, Ser: 1.84 mg/dL — ABNORMAL HIGH (ref 0.61–1.24)
GFR, Estimated: 41 mL/min — ABNORMAL LOW (ref >60)
Glucose, Bld: 91 mg/dL (ref 70–99)
Potassium: 4.6 mmol/L (ref 3.5–5.1)
Sodium: 138 mmol/L (ref 135–145)

## 2020-03-24 LAB — TROPONIN I (HIGH SENSITIVITY)
Troponin I (High Sensitivity): 37 ng/L — ABNORMAL HIGH (ref <18)
Troponin I (High Sensitivity): 29 ng/L — ABNORMAL HIGH (ref <18)

## 2020-03-24 MED ORDER — ONDANSETRON HCL 4 MG/2ML IJ SOLN: 4.0000 mg | Freq: Once | INTRAMUSCULAR | Status: DC

## 2020-03-24 MED ORDER — IPRATROPIUM-ALBUTEROL 0.5-2.5 (3) MG/3ML IN SOLN: 3.0000 mL | Freq: Once | RESPIRATORY_TRACT | Status: DC

## 2020-03-24 NOTE — ED Notes (Signed)
PT has meal tray at bed side

## 2020-03-24 NOTE — ED Notes (Signed)
Got patient into a gown on the monitor patient is resting with call bell in reach 

## 2020-03-24 NOTE — ED Notes (Signed)
PT eating lunch 

## 2020-03-24 NOTE — ED Triage Notes (Signed)
Patient arrived with EMS from street (homeless) reports left chest pain with SOB , cough , chest congestion and fatigue onset this evening .

## 2020-03-24 NOTE — ED Provider Notes (Signed)
[facility] EMERGENCY DEPARTMENT Provider Note   CSN: [id number] Arrival date & time: 03/24/20  1791     History Chief Complaint  Patient presents with  . Chest Pain    Allen Mayer is a 59 y.o. male.  HPI   59 year old male with past medical history of CAD status post MI, cardiomyopathy with CHF, COPD, HTN, CKD, schizophrenia presents the emergency department with reported shortness of breath and midsternal chest pain.  Patient states he was out last night in the cold.  This caused him to have irritation and cough.  This is what brought on intermittent midsternal chest sharp discomfort that self resolved.  He had no exertional symptoms.  He states he has an inhaler but he did not use it last night.  He has been taking his outpatient cardiac meds as directed.  He had a recent admission for heart failure.  Denies any swelling of his lower extremities, fever, vomiting/diarrhea.  Past Medical History:  Diagnosis Date  . Acid reflux   . Alcohol abuse   . Arthritis   . Asthma   . Back pain   . Bronchitis   . CKD (chronic kidney disease), stage III (HCC)   . Cocaine abuse (HCC)   . COPD (chronic obstructive pulmonary disease) (HCC)   . History of noncompliance with medical treatment, presenting hazards to health   . Homelessness   . Hypertension   . LV (left ventricular) mural thrombus   . Myocardial infarction (HCC)   . Neuropathic pain   . NICM (nonischemic cardiomyopathy) (HCC) 2019  . NSVT (nonsustained ventricular tachycardia) (HCC)   . Pericardial effusion   . Pulmonary hypertension (HCC)   . RVF (right ventricular failure) (HCC)   . Schizo affective schizophrenia [facility])     Patient Active Problem List   Diagnosis Date Noted  . Acute exacerbation of congestive heart failure (HCC) 02/28/2020  . Chronic systolic HF (heart failure) (HCC) 02/23/2020  . Long term (current) use of anticoagulants 11/14/2019  . RUQ pain   . Acute on chronic combined  systolic and diastolic CHF (congestive heart failure) (HCC) 10/28/2019  . LV (left ventricular) mural thrombus   . Acute exacerbation of CHF (congestive heart failure) (HCC) 10/27/2019  . Acute respiratory failure with hypoxia (HCC) 10/27/2019  . SOB (shortness of breath)   . Coronary artery calcification seen on CAT scan   . DCM (dilated cardiomyopathy) (HCC)   . Acute CHF (congestive heart failure) (HCC) 12/11/2017  . Elevated troponin 12/11/2017  . COPD (chronic obstructive pulmonary disease) (HCC) 12/11/2017  . 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 Tobacco abuse 11/19/2014  . Essential hypertension, benign 11/19/2014  . Neuropathic pain     Past Surgical History:  Procedure Laterality Date  . None    . RIGHT/LEFT HEART CATH AND CORONARY ANGIOGRAPHY N/A 12/13/2017   Procedure: RIGHT/LEFT HEART CATH AND CORONARY ANGIOGRAPHY;  Surgeon: Runell Gess, MD;  Location: [facility] INVASIVE CV LAB;  Service: Cardiovascular;  Laterality: N/A;       Family History  Problem Relation Age of Onset  . Heart attack Mother        Died age 46  . Heart attack Brother        28    Social History   Tobacco Use  . Smoking status: Current Every Day Smoker    Packs/day: 0.50  . Smokeless tobacco: Never Used  Vaping Use  . Vaping Use: Never used  Substance Use Topics  . Alcohol use: Yes    Comment: occas  . Drug use: Not Currently    Types: Cocaine, Marijuana    Home Medications Prior to Admission medications   Medication Sig Start Date End Date Taking? Authorizing Provider  albuterol (VENTOLIN HFA) 108 (90 Base) MCG/ACT inhaler Inhale 2 puffs into the lungs every 4 (four) hours as needed for wheezing or shortness of breath. 12/04/19  Yes Rollene Rotunda, MD  ARIPiprazole (ABILIFY) 10 MG tablet Take 10 mg by mouth 2 (two) times daily. 01/08/20  Yes [provider]  benztropine (COGENTIN) 1 MG tablet Take 1 tablet (1 mg total) by  mouth at bedtime. 06/22/17  Yes Pisciotta, Joni Reining, PA-C  carvedilol (COREG) 6.25 MG tablet Take 1 tablet (6.25 mg total) by mouth 2 (two) times daily. 12/04/19  Yes Rollene Rotunda, MD  citalopram (CELEXA) 20 MG tablet Take 1 tablet (20 mg total) by mouth daily. 06/22/17  Yes Pisciotta, Joni Reining, PA-C  gabapentin (NEURONTIN) 400 MG capsule Take 1 capsule (400 mg total) by mouth daily as needed (nerve pain). 11/02/19  Yes Amin, Loura Halt, MD  hydrALAZINE (APRESOLINE) 25 MG tablet Take 1 tablet (25 mg total) by mouth every 8 (eight) hours. 03/03/20 05/02/20 Yes Doran Stabler, DO  isosorbide mononitrate (IMDUR) 30 MG 24 hr tablet Take 0.5 tablets (15 mg total) by mouth daily. 03/04/20 05/03/20 Yes Doran Stabler, DO  nitroGLYCERIN (NITROSTAT) 0.4 MG SL tablet Place 1 tablet (0.4 mg total) under the tongue every 5 (five) minutes as needed for chest pain. 12/04/19  Yes Rollene Rotunda, MD  torsemide (DEMADEX) 20 MG tablet Take 2 tablets (40 mg total) by mouth daily. 03/04/20 05/03/20 Yes Doran Stabler, DO  Vitamin D, Ergocalciferol, (DRISDOL) 1.25 MG (50000 UNIT) CAPS capsule Take 50,000 Units by mouth once a week. 01/24/20  Yes [provider]  FLOVENT HFA 110 MCG/ACT inhaler Inhale 2 puffs into the lungs 2 (two) times daily. Patient not taking: Reported on 03/24/2020 12/04/19   Rollene Rotunda, MD  oxyCODONE (OXY IR/ROXICODONE) 5 MG immediate release tablet Take 1 tablet (5 mg total) by mouth 3 (three) times daily as needed (pain). Patient not taking: Reported on 03/24/2020 11/02/19   Dimple Nanas, MD    Allergies    Patient has no known allergies.  Review of Systems   Review of Systems  Constitutional: Negative for chills and fever.  HENT: Negative for congestion.   Eyes: Negative for visual disturbance.  Respiratory: Positive for cough, chest tightness, shortness of breath and wheezing.   Cardiovascular: Negative for chest pain, palpitations and leg swelling.  Gastrointestinal: Negative for  abdominal pain, diarrhea and vomiting.  Genitourinary: Negative for dysuria.  Skin: Negative for rash.  Neurological: Negative for headaches.    Physical Exam Updated Vital Signs BP (!) 120/97   Pulse 97   Temp 98 F (36.7 C)   Resp 20   Ht 6\' 1"  (1.854 m)   Wt 90 kg   SpO2 95%   BMI 26.18 kg/m   Physical Exam Vitals and nursing note reviewed.  Constitutional:      Appearance: Normal appearance.  HENT:     Head: Normocephalic.     Mouth/Throat:     Mouth: Mucous membranes are moist.  Cardiovascular:     Rate and Rhythm: Normal rate.  Pulmonary:     Effort: Pulmonary effort is normal. No respiratory distress.     Breath sounds: Examination of the right-lower field reveals decreased breath sounds. Examination  of the left-lower field reveals decreased breath sounds. Decreased breath sounds and wheezing present. No rales.  Chest:     Chest wall: No crepitus.  Abdominal:     Palpations: Abdomen is soft.     Tenderness: There is no abdominal tenderness.  Musculoskeletal:     Right lower leg: No edema.     Left lower leg: No edema.  Skin:    General: Skin is warm.  Neurological:     Mental Status: He is alert and oriented to person, place, and time. Mental status is at baseline.  Psychiatric:        Mood and Affect: Mood normal.     ED Results / Procedures / Treatments   Labs (all labs ordered are listed, but only abnormal results are displayed) Labs Reviewed  BASIC METABOLIC PANEL - Abnormal; Notable for the following components:      Result Value   Chloride 94 (*)    BUN 25 (*)    Creatinine, Ser 1.84 (*)    GFR, Estimated 41 (*)    All other components within normal limits  TROPONIN I (HIGH SENSITIVITY) - Abnormal; Notable for the following components:   Troponin I (High Sensitivity) 37 (*)    All other components within normal limits  TROPONIN I (HIGH SENSITIVITY) - Abnormal; Notable for the following components:   Troponin I (High Sensitivity) 29 (*)     All other components within normal limits  CBC    EKG EKG Interpretation  Date/Time:  Tuesday March 24 2020 14:41:00 EST Ventricular Rate:  94 PR Interval:  202 QRS Duration: 95 QT Interval:  386 QTC Calculation: 483 R Axis:   -84 Text Interpretation: Sinus rhythm Ventricular premature complex Probable left atrial enlargement Left anterior fascicular block LVH with secondary repolarization abnormality Anterior infarct, old NSR, T wave inversions v5-6 unchanged Confirmed by Coralee Pesa [phone]) on 03/24/2020 3:14:18 PM   Radiology DG Chest 2 View  Result Date: 03/24/2020 CLINICAL DATA:  Left-sided chest pain with cough and shortness of breath. EXAM: CHEST - 2 VIEW COMPARISON:  02/28/2020 FINDINGS: The cardiac silhouette remains mildly to moderately enlarged. No airspace consolidation, edema, pleural effusion, or pneumothorax is identified. No acute osseous abnormality is seen. IMPRESSION: No active cardiopulmonary disease. Electronically Signed   By: Sebastian Ache M.D.   On: 03/24/2020 05:21    Procedures Procedures (including critical care time)  Medications Ordered in ED Medications - No data to display  ED Course  I have reviewed the triage vital signs and the nursing notes.  Pertinent labs & imaging results that were available during my care of the patient were reviewed by me and considered in my medical decision making (see chart for details).  Clinical Course as of 03/24/20 1651  Tue Mar 24, 2020  1514 Vital signs are stable.  Patient has wheezing on exam.  EKG shows isolated T wave inversions that are unchanged from previous.  Chest x-ray shows no focal finding.  Troponins are baseline with no delta, no active chest pain. [KH]    Clinical Course User Index [KH] Cherene Dobbins, Clabe Seal, DO   MDM Rules/Calculators/A&P                          59 year old male here for evaluation of cough, wheezing, shortness of breath and midsternal chest pain.  EKG is baseline without  any new ischemic changes.  He has wheezing on exam with cough.  No active chest  pain, no findings of fluid overload.  After DuoNeb patient's wheezing has subsided, he has had no recurrent chest pain since being here.  Troponins are baseline with no significant delta.  I believe the patient's discomfort was from his wheezing and breathing, low suspicion for ACS, very low suspicion for PE.  No indication for admission at this time.  Patient has been up and ambulating without any difficulty.  Patient will be discharged and treated as an outpatient.  Discharge plan discussed, patient verbalizes understanding and agreement. Final Clinical Impression(s) / ED Diagnoses Final diagnoses:  Atypical chest pain    Rx / DC Orders ED Discharge Orders    None       Rozelle Logan, DO 03/24/20 1651

## 2020-03-24 NOTE — ED Notes (Signed)
Lunch Tray Ordered @1355 .

## 2020-03-24 NOTE — Discharge Instructions (Addendum)
You have been seen and discharged from the emergency department.  Follow-up with your primary provider for reevaluation. Take home medications as prescribed. If you have any worsening symptoms or further concerns or health please return to emergency department for further evaluation.

## 2020-03-26 ENCOUNTER — Ambulatory Visit: Payer: Medicaid Other | Admitting: Cardiology

## 2020-04-02 ENCOUNTER — Ambulatory Visit: Payer: Medicaid Other | Admitting: Cardiology

## 2020-04-19 NOTE — Progress Notes (Deleted)
Cardiology Office Note:    Date:  04/19/2020   ID:  Allen Mayer, DOB 11/26/1960, MRN [id number]  PCP:  [facility]  Cardiologist:  Rollene Rotunda, MD  Electrophysiologist:  None   Referring MD: [facility]   Chief Complaint: hospital follow-up for CHF  History of Present Illness:    Allen Mayer is a 60 y.o. male with a history of normal coronaries on cardiac cath in 2019, chronic combined CHF/non-ischemic cardiomyopathy with EF of <20% on Echo in 10/2019, LV thrombus non-compliant with Coumadin, non-sustained VT, COPD, hypertension, CKD III, schizoaffective disorder, polysusbtance abuse, and homelessness who is followed by Dr. Antoine Poche and presents today for follow-up of CHF.  Patient was diagnosed with non-ischemic cardiomyopathy in 12/2017. Echo showed LVEF of 25-30% with diffuse hypokinesis and grade 3 diastolic dysfunction as well as mildly to moderately reduced RV systolic function. Cardiac cath at that time showed clean coronary arteries. Cardiomyopathy was felt to be due to hypertension and polysubstance abuse. He was readmitted in 10/2019 with worsening CHF after running out of his medications and using cocaine. Echo showed LVEF of <20% with grade 3 diastolic dysfunction and likely chronic LV thrombus. RV was moderately enlarged with moderately reduced systolic function. Small pericardial effusion also noted. He had runs of non-sustained VT that admission and was started on Coumadin for his LV thrombus. He was not felt to be a candidate for LifeVest/ICD due to non-compliance. He was seen in clinic once for follow-up in 11/2019 but has no showed multiple other appointments even after out staff had helped arranged transportation. He also failed to follow-up for INR checks. However, he was seen multiple times in the ED for edema in the context of not taking his medications.   He was admitted from 02/28/2020 to 03/03/2020 for acute on chronic combined  CHF after presenting with chest pain and shortness of breath. He had again been non-compliant with his medications. Urine drug screen positive for cocaine. High-sensitivity troponin peaked at 109. He had recurrent NSVT during this admission. He was diuresed 12.9 L. Discharge weight 181 lbs. He was discharged on Torsemide 40mg  daily, Coreg 6.25mg  twice daily, Hydralazine 25mg  three times daily, and Imdur 15mg  daily. ACE/ARB/ARNI was not restarted due to patient's report of "facial swelling" with ACE/ARB. He was not restarted on Coumadin due to the fact was non-compliant with INR checks. Patient also reportedly refused anticoagulation because he noted bleeding with Coumadin. Follow-up was arranged but he cancelled one appointment and no-showed another. He seen in the ED on 03/24/2020 for chest pain and shortness of breath. High-sensitivity troponin minimally elevated but flat at 37 >> 29. EKG was unchanged from prior tracings. He had again run out of CHF medications. He was noted to be wheezing on exam. This was felt to be cause of chest pain. Wheezing resolved after DuoNeb and patient had no recurrent chest pain. Therefore, he was felt to be stable for discharge.  Patient presents today for follow-up. ***  Chronic Combined CHF/Non-Ischemic Cardiomyopathy - Diagnosed in 2019. Cardiac cath at the time showed normal coronaries. Cardiomyopathy felt to be secondary to hypertension and polysubstance abuse. He has been non-compliant with medications and follow-up appointments.  - Most recent Echo in 10/2019 showed LVEF of  <20% with grade 3 diastolic dysfunction and likely chronic LV thrombus. RV was moderately enlarged with moderately reduced systolic function. Small pericardial effusion also noted. - *** - Continue Torsemide 40mg  daily. - Continue Coreg 6.25mg  twice daily. - Continue  Hydralazine 25mg  three times daily and Imdur 15mg  daily. - No ACE/ARB/ARNI due to reports of facial swelling with ARB/ACE in the  past.  LV Thrombus - Noted on Echo in 10/2019.  - Started on Coumadin but was not compliant with this and had failed to showed up for INR checks. He reportedly refused anticoagulation due to bleeding with this. Decision was made not to restart this.  Non-Sustained VT - *** - Continue Coreg as above.  Hypertension - *** - Continue above medications.   CKD Stage III - Most recent creatinine 1.84 in 03/2020. Baseline seems to be around 1.4 to 1.7. - Will recheck BMET today. ***   Past Medical History:  Diagnosis Date  . Acid reflux   . Alcohol abuse   . Arthritis   . Asthma   . Back pain   . Bronchitis   . CKD (chronic kidney disease), stage III (HCC)   . Cocaine abuse (HCC)   . COPD (chronic obstructive pulmonary disease) (HCC)   . History of noncompliance with medical treatment, presenting hazards to health   . Homelessness   . Hypertension   . LV (left ventricular) mural thrombus   . Myocardial infarction (HCC)   . Neuropathic pain   . NICM (nonischemic cardiomyopathy) (HCC) 2019  . NSVT (nonsustained ventricular tachycardia) (HCC)   . Pericardial effusion   . Pulmonary hypertension (HCC)   . RVF (right ventricular failure) (HCC)   . Schizo affective schizophrenia [facility])     Past Surgical History:  Procedure Laterality Date  . None    . RIGHT/LEFT HEART CATH AND CORONARY ANGIOGRAPHY N/A 12/13/2017   Procedure: RIGHT/LEFT HEART CATH AND CORONARY ANGIOGRAPHY;  Surgeon: [facility], MD;  Location: [facility] INVASIVE CV LAB;  Service: Cardiovascular;  Laterality: N/A;    Current Medications: No outpatient medications have been marked as taking for the 04/27/20 encounter (Appointment) with Runell Gess, PA-C.     Allergies:   Patient has no known allergies.   Social History   Socioeconomic History  . Marital status: Married    Spouse name: Not on file  . Number of children: Not on file  . Years of education: Not on file  . Highest education level: Not on  file  Occupational History  . Not on file  Tobacco Use  . Smoking status: Current Every Day Smoker    Packs/day: 0.50  . Smokeless tobacco: Never Used  Vaping Use  . Vaping Use: Never used  Substance and Sexual Activity  . Alcohol use: Yes    Comment: occas  . Drug use: Not Currently    Types: Cocaine, Marijuana  . Sexual activity: Not on file  Other Topics Concern  . Not on file  Social History Narrative   Lives with fiance.     Social Determinants of Health   Financial Resource Strain: Not on file  Food Insecurity: Not on file  Transportation Needs: Not on file  Physical Activity: Not on file  Stress: Not on file  Social Connections: Not on file     Family History: The patient's ***family history includes Heart attack in his brother and mother.  ROS:   Please see the history of present illness.    *** All other systems reviewed and are negative.  EKGs/Labs/Other Studies Reviewed:    The following studies were reviewed today:  Echocardiogram 12/11/2017: Study Conclusions: - Left ventricle: The cavity size was mildly dilated. Wall  thickness was normal. Systolic function was severely reduced.  The  estimated ejection fraction was in the range of 25% to 30%.  Diffuse hypokinesis. Doppler parameters are consistent with a  reversible restrictive pattern, indicative of decreased left  ventricular diastolic compliance and/or increased left atrial  pressure (grade 3 diastolic dysfunction).  - Right ventricle: Systolic function was mildly to moderately  reduced.  - Right atrium: The atrium was mildly to moderately dilated. _______________  Cardiac Catheterization 12/13/2017: Impressions: Mr. Caputo has a nonischemic cardia myopathy with clean coronary arteries, severe LV dysfunction with elevated LVEDP and filling pressures.  Probably a combination of poorly controlled hypertension and ethanol abuse.  He probably would require additional diuresis and  optimal medical therapy.  He may ultimately require an ICD for primary prevention.  A right femoral angiogram was performed and a MYNX closure device was successfully deployed achieving hemostasis.  The patient left the lab in stable condition. _______________  Echocardiogram 10/28/2019: Impressions: 1. There appears to be likely chronic LV thrombus at the apex. Patient  without IV access, so contrast not given. . Left ventricular ejection  fraction, by estimation, is <20%. The left ventricle has severely  decreased function. The left ventricle  demonstrates global hypokinesis. The left ventricular internal cavity size  was severely dilated. Left ventricular diastolic parameters are consistent  with Grade III diastolic dysfunction (restrictive). Elevated left  ventricular end-diastolic pressure.  2. Right ventricular systolic function is moderately reduced. The right  ventricular size is moderately enlarged. There is moderately elevated  pulmonary artery systolic pressure.  3. Left atrial size was mild to moderately dilated.  4. Right atrial size was moderately dilated.  5. Small circumferential pericardial effusion, bordering on moderate size  posterior to RV. No echo evidence of tamponade.. The pericardial effusion  is circumferential. There is no evidence of cardiac tamponade.  6. The mitral valve is normal in structure. Mild to moderate mitral valve  regurgitation. No evidence of mitral stenosis.  7. Tricuspid valve regurgitation is moderate.  8. The aortic valve is tricuspid. Aortic valve regurgitation is not  visualized.  9. The inferior vena cava is dilated in size with <50% respiratory  variability, suggesting right atrial pressure of 15 mmHg.   Conclusion(s)/Recommendation(s): Severely reduced LVEF, grade 3 diastolic  dysfunction with nearly nonexistent A wave and elevated LVEDP. Small to moderate circumferential pericardial effusion without echo tamponade. Chronic  appearing apical LV thrombus.   EKG:  EKG *** ordered today. EKG personally reviewed and demonstrates ***.  Recent Labs: 02/09/2020: ALT 43 02/28/2020: B Natriuretic Peptide 2,265.7 03/03/2020: Magnesium 1.7 03/24/2020: BUN 25; Creatinine, Ser 1.84; Hemoglobin 14.0; Platelets 205; Potassium 4.6; Sodium 138  Recent Lipid Panel    Component Value Date/Time   CHOL 126 10/28/2019 0712   TRIG 57 10/28/2019 0712   HDL 32 (L) 10/28/2019 0712   CHOLHDL 3.9 10/28/2019 0712   VLDL 11 10/28/2019 0712   LDLCALC 83 10/28/2019 0712    Physical Exam:    Vital Signs: There were no vitals taken for this visit.    Wt Readings from Last 3 Encounters:  03/24/20 198 lb 6.6 oz (90 kg)  03/03/20 181 lb 7 oz (82.3 kg)  02/11/20 215 lb (97.5 kg)     General: 60 y.o. male in no acute distress. HEENT: Normocephalic and atraumatic. Sclera clear. EOMs intact. Neck: Supple. No carotid bruits. No JVD. Heart: *** RRR. Distinct S1 and S2. No murmurs, gallops, or rubs. Radial and distal pedal pulses 2+ and equal bilaterally. Lungs: No increased work of breathing. Clear to  ausculation bilaterally. No wheezes, rhonchi, or rales.  Abdomen: Soft, non-distended, and non-tender to palpation. Bowel sounds present in all 4 quadrants.  MSK: Normal strength and tone for age. *** Extremities: No lower extremity edema.    Skin: Warm and dry. Neuro: Alert and oriented x3. No focal deficits. Psych: Normal affect. Responds appropriately.   Assessment:    No diagnosis found.  Plan:     Disposition: Follow up in ***   Medication Adjustments/Labs and Tests Ordered: Current medicines are reviewed at length with the patient today.  Concerns regarding medicines are outlined above.  No orders of the defined types were placed in this encounter.  No orders of the defined types were placed in this encounter.   There are no Patient Instructions on file for this visit.   Signed, Corrin Parker, PA-C  04/19/2020  1:43 PM     [facility]

## 2020-04-25 NOTE — Progress Notes (Deleted)
Cardiology Office Note:    Date:  04/25/2020   ID:  Allen Mayer, DOB 12/03/1960, MRN [id number]  PCP:  [facility]  Cardiologist:  Rollene Rotunda, MD  Electrophysiologist:  None   Referring MD: [facility]   Chief Complaint: hospital follow-up for CHF  History of Present Illness:    Allen Mayer is a 60 y.o. male with a history of normal coronaries on cardiac cath in 2019, chronic combined CHF/non-ischemic cardiomyopathy with EF of <20% on Echo in 10/2019, LV thrombus non-compliant with Coumadin, non-sustained VT, COPD, hypertension, CKD III, schizoaffective disorder, polysusbtance abuse, and homelessness who is followed by Dr. Antoine Poche and presents today for follow-up of CHF.  Patient was diagnosed with non-ischemic cardiomyopathy in 12/2017. Echo showed LVEF of 25-30% with diffuse hypokinesis and grade 3 diastolic dysfunction as well as mildly to moderately reduced RV systolic function. Cardiac cath at that time showed clean coronary arteries. Cardiomyopathy was felt to be due to hypertension and polysubstance abuse. He was readmitted in 10/2019 with worsening CHF after running out of his medications and using cocaine. Echo showed LVEF of <20% with grade 3 diastolic dysfunction and likely chronic LV thrombus. RV was moderately enlarged with moderately reduced systolic function. Small pericardial effusion also noted. He had runs of non-sustained VT that admission and was started on Coumadin for his LV thrombus. He was not felt to be a candidate for LifeVest/ICD due to non-compliance. He was seen in clinic once for follow-up in 11/2019 but has no showed multiple other appointments even after out staff had helped arranged transportation. He also failed to follow-up for INR checks. However, he was seen multiple times in the ED for edema in the context of not taking his medications.   He was admitted from 02/28/2020 to 03/03/2020 for acute on chronic  combined CHF after presenting with chest pain and shortness of breath. He had again been non-compliant with his medications. Urine drug screen positive for cocaine. High-sensitivity troponin peaked at 109. He had recurrent NSVT during this admission. He was diuresed 12.9 L. Discharge weight 181 lbs. He was discharged on Torsemide 40mg  daily, Coreg 6.25mg  twice daily, Hydralazine 25mg  three times daily, and Imdur 15mg  daily. ACE/ARB/ARNI was not restarted due to patient's report of "facial swelling" with ACE/ARB. He was not restarted on Coumadin due to the fact was non-compliant with INR checks. Patient also reportedly refused anticoagulation because he noted bleeding with Coumadin. Follow-up was arranged but he cancelled one appointment and no-showed another. He seen in the ED on 03/24/2020 for chest pain and shortness of breath. High-sensitivity troponin minimally elevated but flat at 37 >> 29. EKG was unchanged from prior tracings. He had again run out of CHF medications. He was noted to be wheezing on exam. This was felt to be cause of chest pain. Wheezing resolved after DuoNeb and patient had no recurrent chest pain. Therefore, he was felt to be stable for discharge.  Patient presents today for follow-up. ***  Chronic Combined CHF/Non-Ischemic Cardiomyopathy - Diagnosed in 2019. Cardiac cath at the time showed normal coronaries. Cardiomyopathy felt to be secondary to hypertension and polysubstance abuse. He has been non-compliant with medications and follow-up appointments.  - Most recent Echo in 10/2019 showed LVEF of  <20% with grade 3 diastolic dysfunction and likely chronic LV thrombus. RV was moderately enlarged with moderately reduced systolic function. Small pericardial effusion also noted. - *** - Continue Torsemide 40mg  daily. - Continue Coreg 6.25mg  twice daily. - Continue  Hydralazine 25mg  three times daily and Imdur 15mg  daily. - No ACE/ARB/ARNI due to reports of facial swelling with ARB/ACE  in the past.  LV Thrombus - Noted on Echo in 10/2019.  - Started on Coumadin but was not compliant with this and had failed to showed up for INR checks. He reportedly refused anticoagulation due to bleeding with this. Decision was made not to restart this.  Non-Sustained VT - *** - Continue Coreg as above.  Hypertension - *** - Continue above medications.   CKD Stage III - Most recent creatinine 1.84 in 03/2020. Baseline seems to be around 1.4 to 1.7. - Will recheck BMET today. ***  Past Medical History:  Diagnosis Date  . Acid reflux   . Alcohol abuse   . Arthritis   . Asthma   . Back pain   . Bronchitis   . CKD (chronic kidney disease), stage III (HCC)   . Cocaine abuse (HCC)   . COPD (chronic obstructive pulmonary disease) (HCC)   . History of noncompliance with medical treatment, presenting hazards to health   . Homelessness   . Hypertension   . LV (left ventricular) mural thrombus   . Myocardial infarction (HCC)   . Neuropathic pain   . NICM (nonischemic cardiomyopathy) (HCC) 2019  . NSVT (nonsustained ventricular tachycardia) (HCC)   . Pericardial effusion   . Pulmonary hypertension (HCC)   . RVF (right ventricular failure) (HCC)   . Schizo affective schizophrenia [facility])     Past Surgical History:  Procedure Laterality Date  . None    . RIGHT/LEFT HEART CATH AND CORONARY ANGIOGRAPHY N/A 12/13/2017   Procedure: RIGHT/LEFT HEART CATH AND CORONARY ANGIOGRAPHY;  Surgeon: [facility], MD;  Location: [facility];  Service: Cardiovascular;  Laterality: N/A;    Current Medications: No outpatient medications have been marked as taking for the 04/29/20 encounter (Appointment) with Runell Gess, PA-C.     Allergies:   Patient has no known allergies.   Social History   Socioeconomic History  . Marital status: Married    Spouse name: Not on file  . Number of children: Not on file  . Years of education: Not on file  . Highest education level: Not  on file  Occupational History  . Not on file  Tobacco Use  . Smoking status: Current Every Day Smoker    Packs/day: 0.50  . Smokeless tobacco: Never Used  Vaping Use  . Vaping Use: Never used  Substance and Sexual Activity  . Alcohol use: Yes    Comment: occas  . Drug use: Not Currently    Types: Cocaine, Marijuana  . Sexual activity: Not on file  Other Topics Concern  . Not on file  Social History Narrative   Lives with fiance.     Social Determinants of Health   Financial Resource Strain: Not on file  Food Insecurity: Not on file  Transportation Needs: Not on file  Physical Activity: Not on file  Stress: Not on file  Social Connections: Not on file     Family History: The patient's ***family history includes Heart attack in his brother and mother.  ROS:   Please see the history of present illness.    *** All other systems reviewed and are negative.  EKGs/Labs/Other Studies Reviewed:    The following studies were reviewed today:  Echocardiogram 12/11/2017: Study Conclusions: - Left ventricle: The cavity size was mildly dilated. Wall  thickness was normal. Systolic function was severely reduced. The  estimated ejection fraction was in the range of 25% to 30%.  Diffuse hypokinesis. Doppler parameters are consistent with a  reversible restrictive pattern, indicative of decreased left  ventricular diastolic compliance and/or increased left atrial  pressure (grade 3 diastolic dysfunction).  - Right ventricle: Systolic function was mildly to moderately  reduced.  - Right atrium: The atrium was mildly to moderately dilated. _______________  Cardiac Catheterization 12/13/2017: Impressions: Mr. Pagnotta has a nonischemic cardia myopathy with clean coronary arteries, severe LV dysfunction with elevated LVEDP and filling pressures.  Probably a combination of poorly controlled hypertension and ethanol abuse.  He probably would require additional diuresis and  optimal medical therapy.  He may ultimately require an ICD for primary prevention.  A right femoral angiogram was performed and a MYNX closure device was successfully deployed achieving hemostasis.  The patient left the lab in stable condition. _______________  Echocardiogram 10/28/2019: Impressions: 1. There appears to be likely chronic LV thrombus at the apex. Patient  without IV access, so contrast not given. . Left ventricular ejection  fraction, by estimation, is <20%. The left ventricle has severely  decreased function. The left ventricle  demonstrates global hypokinesis. The left ventricular internal cavity size  was severely dilated. Left ventricular diastolic parameters are consistent  with Grade III diastolic dysfunction (restrictive). Elevated left  ventricular end-diastolic pressure.  2. Right ventricular systolic function is moderately reduced. The right  ventricular size is moderately enlarged. There is moderately elevated  pulmonary artery systolic pressure.  3. Left atrial size was mild to moderately dilated.  4. Right atrial size was moderately dilated.  5. Small circumferential pericardial effusion, bordering on moderate size  posterior to RV. No echo evidence of tamponade.. The pericardial effusion  is circumferential. There is no evidence of cardiac tamponade.  6. The mitral valve is normal in structure. Mild to moderate mitral valve  regurgitation. No evidence of mitral stenosis.  7. Tricuspid valve regurgitation is moderate.  8. The aortic valve is tricuspid. Aortic valve regurgitation is not  visualized.  9. The inferior vena cava is dilated in size with <50% respiratory  variability, suggesting right atrial pressure of 15 mmHg.   Conclusion(s)/Recommendation(s): Severely reduced LVEF, grade 3 diastolic  dysfunction with nearly nonexistent A wave and elevated LVEDP. Small to moderate circumferential pericardial effusion without echo tamponade. Chronic  appearing apical LV thrombus.   EKG:  EKG *** ordered today. EKG personally reviewed and demonstrates ***.  Recent Labs: 02/09/2020: ALT 43 02/28/2020: B Natriuretic Peptide 2,265.7 03/03/2020: Magnesium 1.7 03/24/2020: BUN 25; Creatinine, Ser 1.84; Hemoglobin 14.0; Platelets 205; Potassium 4.6; Sodium 138  Recent Lipid Panel    Component Value Date/Time   CHOL 126 10/28/2019 0712   TRIG 57 10/28/2019 0712   HDL 32 (L) 10/28/2019 0712   CHOLHDL 3.9 10/28/2019 0712   VLDL 11 10/28/2019 0712   LDLCALC 83 10/28/2019 0712    Physical Exam:    Vital Signs: There were no vitals taken for this visit.    Wt Readings from Last 3 Encounters:  03/24/20 198 lb 6.6 oz (90 kg)  03/03/20 181 lb 7 oz (82.3 kg)  02/11/20 215 lb (97.5 kg)     General: 60 y.o. male in no acute distress. HEENT: Normocephalic and atraumatic. Sclera clear. EOMs intact. Neck: Supple. No carotid bruits. No JVD. Heart: *** RRR. Distinct S1 and S2. No murmurs, gallops, or rubs. Radial and distal pedal pulses 2+ and equal bilaterally. Lungs: No increased work of breathing. Clear to ausculation bilaterally.  No wheezes, rhonchi, or rales.  Abdomen: Soft, non-distended, and non-tender to palpation. Bowel sounds present in all 4 quadrants.  MSK: Normal strength and tone for age. *** Extremities: No lower extremity edema.    Skin: Warm and dry. Neuro: Alert and oriented x3. No focal deficits. Psych: Normal affect. Responds appropriately.   Assessment:    No diagnosis found.  Plan:     Disposition: Follow up in ***   Medication Adjustments/Labs and Tests Ordered: Current medicines are reviewed at length with the patient today.  Concerns regarding medicines are outlined above.  No orders of the defined types were placed in this encounter.  No orders of the defined types were placed in this encounter.   There are no Patient Instructions on file for this visit.   Signed, Corrin Parker, PA-C  04/25/2020  7:39 PM    [facility]

## 2020-04-27 ENCOUNTER — Ambulatory Visit: Payer: Medicaid Other | Admitting: Student

## 2020-04-29 ENCOUNTER — Ambulatory Visit: Payer: Medicaid Other | Admitting: Student

## 2020-05-18 ENCOUNTER — Telehealth: Payer: Self-pay | Admitting: Cardiology

## 2020-05-18 MED ORDER — HYDRALAZINE HCL 25 MG PO TABS: 25.0000 mg | ORAL_TABLET | Freq: Three times a day (TID) | ORAL | 1 refills | Status: DC

## 2020-05-18 MED ORDER — ISOSORBIDE MONONITRATE ER 30 MG PO TB24: 15.0000 mg | ORAL_TABLET | Freq: Every day | ORAL | 1 refills | Status: DC

## 2020-05-18 MED ORDER — CARVEDILOL 6.25 MG PO TABS: 6.2500 mg | ORAL_TABLET | Freq: Two times a day (BID) | ORAL | 1 refills | Status: DC

## 2020-05-18 MED ORDER — TORSEMIDE 20 MG PO TABS: 40.0000 mg | ORAL_TABLET | Freq: Every day | ORAL | 1 refills | Status: DC

## 2020-05-18 NOTE — Telephone Encounter (Signed)
Patient calling back. He states he is completely out of all his medications.

## 2020-05-18 NOTE — Telephone Encounter (Signed)
*  STAT* If patient is at the pharmacy, call can be transferred to refill team.   1. Which medications need to be refilled? (please list name of each medication and dose if known) Pt said he need all his medicine refilled- he said he did not know the name of them- he has an appt this Friday with Joni Reining  2. Which pharmacy/location (including street and city if local pharmacy) is medication to be sent to? [facility] [address], [place]  3. Do they need a 30 day or 90 day supply?  Enough at least until his appt on 05-22-20- more if possible

## 2020-05-18 NOTE — Telephone Encounter (Signed)
One month supply sent to preferred pharmacy, patient will need year refill at office appointment. Will update appt notes. Patient aware and verbalized understanding.

## 2020-05-18 NOTE — Telephone Encounter (Signed)
Patient is following up.  He states he is completely out of medication.  *STAT* If patient is at the pharmacy, call can be transferred to refill team.   1. Which medications need to be refilled? (please list name of each medication and dose if known) patient unsure of the names of the medications.  2. Which pharmacy/location (including street and city if local pharmacy) is medication to be sent to? [facility] [address] [place]  3. Do they need a 30 day or 90 day supply? Patient is requesting an emergency supply to hold him until his appointment on 05/22/20 with Joni Reining.

## 2020-05-19 NOTE — Progress Notes (Signed)
Cardiology Office Note   Date:  05/19/2020   ID:  Allen Mayer, DOB 12-21-1960, MRN [id number]  PCP:  [facility], [facility]  Cardiologist: Dr. Antoine Poche CC: Hospital follow up   History of Present Illness: Allen Mayer is a 60 y.o. male who presents for ongoing assessment and management of chronic systolic CHF, with history of NSVT, with history of chronic kidney disease stage IIIb, COPD, hypertension, schizoaffective disorder with polysubstance abuse., and medical noncompliance.    Most recent echocardiogram on 10/28/2019 revealed significantly reduced LV function with EF of less than 20%.  He was not found to be a candidate for ICD due to noncompliance.  He was also noted to have an LV apex cold thrombus.  He was not started on a beta-blocker due to bradycardia.    He presented to the emergency room on 03/24/2020 with complaints of midsternal sharp chest discomfort that self resolved.  He reported that he was compliant with his cardiac medications.  He was ruled out for ACS.  He was ruled out for PE.  He comes today for refills on his medications and for evaluation.  He is feeling well today He continues to smoke. He has not refills on any of his medications since being released from the hospital. He does not have a PCP.He was given a 15 day supply on the discharge medications but still has others. He is uncertain what the medications are for. He unfortunately continues to smoke, occasional cocaine use.  Still drinks some. He is here with his friend and at this time appears to be committed to changing his lifestyle.    Past Medical History:  Diagnosis Date  . Acid reflux   . Alcohol abuse   . Arthritis   . Asthma   . Back pain   . Bronchitis   . CKD (chronic kidney disease), stage III (HCC)   . Cocaine abuse (HCC)   . COPD (chronic obstructive pulmonary disease) (HCC)   . History of noncompliance with medical treatment, presenting hazards to health   . Homelessness    . Hypertension   . LV (left ventricular) mural thrombus   . Myocardial infarction (HCC)   . Neuropathic pain   . NICM (nonischemic cardiomyopathy) (HCC) 2019  . NSVT (nonsustained ventricular tachycardia) (HCC)   . Pericardial effusion   . Pulmonary hypertension (HCC)   . RVF (right ventricular failure) (HCC)   . Schizo affective schizophrenia [facility])     Past Surgical History:  Procedure Laterality Date  . None    . RIGHT/LEFT HEART CATH AND CORONARY ANGIOGRAPHY N/A 12/13/2017   Procedure: RIGHT/LEFT HEART CATH AND CORONARY ANGIOGRAPHY;  Surgeon: Runell Gess, MD;  Location: [facility];  Service: Cardiovascular;  Laterality: N/A;     Current Outpatient Medications  Medication Sig Dispense Refill  . albuterol (VENTOLIN HFA) 108 (90 Base) MCG/ACT inhaler Inhale 2 puffs into the lungs every 4 (four) hours as needed for wheezing or shortness of breath. 18 g 0  . ARIPiprazole (ABILIFY) 10 MG tablet Take 10 mg by mouth 2 (two) times daily.    . benztropine (COGENTIN) 1 MG tablet Take 1 tablet (1 mg total) by mouth at bedtime. 30 tablet 0  . carvedilol (COREG) 6.25 MG tablet Take 1 tablet (6.25 mg total) by mouth 2 (two) times daily. 60 tablet 1  . citalopram (CELEXA) 20 MG tablet Take 1 tablet (20 mg total) by mouth daily. 30 tablet 0  . FLOVENT HFA 110 MCG/ACT  inhaler Inhale 2 puffs into the lungs 2 (two) times daily. (Patient not taking: Reported on 03/24/2020) 1 each 1  . gabapentin (NEURONTIN) 400 MG capsule Take 1 capsule (400 mg total) by mouth daily as needed (nerve pain). 30 capsule 0  . hydrALAZINE (APRESOLINE) 25 MG tablet Take 1 tablet (25 mg total) by mouth every 8 (eight) hours. 90 tablet 1  . isosorbide mononitrate (IMDUR) 30 MG 24 hr tablet Take 0.5 tablets (15 mg total) by mouth daily. 15 tablet 1  . nitroGLYCERIN (NITROSTAT) 0.4 MG SL tablet Place 1 tablet (0.4 mg total) under the tongue every 5 (five) minutes as needed for chest pain. 30 tablet 3  . oxyCODONE  (OXY IR/ROXICODONE) 5 MG immediate release tablet Take 1 tablet (5 mg total) by mouth 3 (three) times daily as needed (pain). (Patient not taking: Reported on 03/24/2020) 4 tablet 0  . torsemide (DEMADEX) 20 MG tablet Take 2 tablets (40 mg total) by mouth daily. 60 tablet 1  . Vitamin D, Ergocalciferol, (DRISDOL) 1.25 MG (50000 UNIT) CAPS capsule Take 50,000 Units by mouth once a week.     No current facility-administered medications for this visit.    Allergies:   Patient has no known allergies.    Social History:  The patient  reports that he has been smoking. He has been smoking about 0.50 packs per day. He has never used smokeless tobacco. He reports current alcohol use. He reports previous drug use. Drugs: Cocaine and Marijuana.   Family History:  The patient's family history includes Heart attack in his brother and mother.    ROS: All other systems are reviewed and negative. Unless otherwise mentioned in H&P    PHYSICAL EXAM: VS:  There were no vitals taken for this visit. , BMI There is no height or weight on file to calculate BMI. GEN: Well nourished, well developed, in no acute distress HEENT: normal Neck: no JVD, carotid bruits, or masses Cardiac: RRR,1/6 systolic murmurs, rubs, or gallops,no edema  Respiratory:  Clear to auscultation bilaterally after coughing., normal work of breathing GI: soft, nontender, nondistended, + BS MS: no deformity or atrophy Skin: warm and dry, no rash Neuro:  Strength and sensation are intact Psych: euthymic mood, full affect   EKG: Not completed this office visit.   Recent Labs: 02/09/2020: ALT 43 02/28/2020: B Natriuretic Peptide 2,265.7 03/03/2020: Magnesium 1.7 03/24/2020: BUN 25; Creatinine, Ser 1.84; Hemoglobin 14.0; Platelets 205; Potassium 4.6; Sodium 138    Lipid Panel    Component Value Date/Time   CHOL 126 10/28/2019 0712   TRIG 57 10/28/2019 0712   HDL 32 (L) 10/28/2019 0712   CHOLHDL 3.9 10/28/2019 0712   VLDL 11  10/28/2019 0712   LDLCALC 83 10/28/2019 0712      Wt Readings from Last 3 Encounters:  03/24/20 198 lb 6.6 oz (90 kg)  03/03/20 181 lb 7 oz (82.3 kg)  02/11/20 215 lb (97.5 kg)      Other studies Reviewed: Left Heart Cath 10/28/2019 1. There appears to be likely chronic LV thrombus at the apex. Patient  without IV access, so contrast not given. . Left ventricular ejection  fraction, by estimation, is <20%. The left ventricle has severely  decreased function. The left ventricle  demonstrates global hypokinesis. The left ventricular internal cavity size  was severely dilated. Left ventricular diastolic parameters are consistent  with Grade III diastolic dysfunction (restrictive). Elevated left  ventricular end-diastolic pressure.  2. Right ventricular systolic function is moderately reduced.  The right  ventricular size is moderately enlarged. There is moderately elevated  pulmonary artery systolic pressure.  3. Left atrial size was mild to moderately dilated.  4. Right atrial size was moderately dilated.  5. Small circumferential pericardial effusion, bordering on moderate size  posterior to RV. No echo evidence of tamponade.. The pericardial effusion  is circumferential. There is no evidence of cardiac tamponade.  6. The mitral valve is normal in structure. Mild to moderate mitral valve  regurgitation. No evidence of mitral stenosis.  7. Tricuspid valve regurgitation is moderate.  8. The aortic valve is tricuspid. Aortic valve regurgitation is not  visualized.  9. The inferior vena cava is dilated in size with <50% respiratory  variability, suggesting right atrial pressure of 15 mmHg.   Conclusion(s)/Recommendation(s): Severely reduced LVEF, grade 3 diastolic  dysfunction with nearly nonexistent A wave and elevated LVEDP. Small to  moderate circumferential pericardial effusion without echo tamponade.  Chronic appearing apical LV thrombus.   Right Heart Cath  Right  Heart Pressures Right atrial pressure- 9/6 Right ventricular pressure- 51/10 Pulmonary artery pressure- 52/31, mean 41 Pulmonary wedge pressure-A-wave 26 mean 24 LVEDP-30 Cardiac output by Fick was 5.29 L/min with an index of 2.44 L/min/m    ASSESSMENT AND PLAN:   1. Systolic CHF in the setting of hypertensive cardiomyopathy: He is currently without complaints but is in need of his medication refills. He does not appear to be fluid overloaded. He states that he is medically compliant with his medicine. I will refills his medications and have counseled him on compliance to avoid repeat hospitalizations with worsening CHF and poorer outcomes. He verbalizes understanding at this thine.    2. Hypertension: Not optimal for severely reduced EF. Will need careful titration of medications to slowly get him to goal. Due to non-compliance issues, I will not change anything now, as he will need to fill his Rx first and then follow up to see how he is responding. Would go up on hydralazine and coreg on next office visit in 3 months,  3, Polysubstance abuse: Significant amount of time discussing the poor outcomes of those with his heart condition who continue to use cocaine and drink heavily. With his kidney disease, he was also concerned that he would end up o dialysis. He wants to avoid this at all costs. He will need close follow up with PCP.  Possibly [facility]. He has been there before and will check on going back. .    Current medicines are reviewed at length with the patient today.  I have spent 30 minutes dedicated to the care of this patient on the date of this encounter to include pre-visit review of records, assessment, management and diagnostic testing,with shared decision making.  Labs/ tests ordered today include: None  Bettey Mare. Liborio Nixon, ANP, AACC   05/19/2020 5:41 PM    [facility] [address] Office [phone] Fax  [phone]  Notice: This dictation was prepared with Dragon dictation along with smaller phrase technology. Any transcriptional errors that result from this process are unintentional and may not be corrected upon review.

## 2020-05-22 ENCOUNTER — Ambulatory Visit (INDEPENDENT_AMBULATORY_CARE_PROVIDER_SITE_OTHER): Payer: Medicaid Other | Admitting: Adult Health

## 2020-05-22 ENCOUNTER — Encounter: Payer: Self-pay | Admitting: Adult Health

## 2020-05-22 ENCOUNTER — Other Ambulatory Visit: Payer: Self-pay

## 2020-05-22 VITALS — BP 112/66 | HR 78 | Ht 72.0 in | Wt 189.0 lb

## 2020-05-22 DIAGNOSIS — I10 Essential (primary) hypertension: Secondary | ICD-10-CM

## 2020-05-22 DIAGNOSIS — I5022 Chronic systolic (congestive) heart failure: Secondary | ICD-10-CM | POA: Diagnosis not present

## 2020-05-22 DIAGNOSIS — Z720 Tobacco use: Secondary | ICD-10-CM | POA: Diagnosis not present

## 2020-05-22 DIAGNOSIS — I2129 ST elevation (STEMI) myocardial infarction involving other sites: Secondary | ICD-10-CM | POA: Diagnosis not present

## 2020-05-22 DIAGNOSIS — F1910 Other psychoactive substance abuse, uncomplicated: Secondary | ICD-10-CM

## 2020-05-22 DIAGNOSIS — I43 Cardiomyopathy in diseases classified elsewhere: Secondary | ICD-10-CM

## 2020-05-22 DIAGNOSIS — J431 Panlobular emphysema: Secondary | ICD-10-CM

## 2020-05-22 MED ORDER — GABAPENTIN 400 MG PO CAPS: 400.0000 mg | ORAL_CAPSULE | Freq: Every day | ORAL | 1 refills | Status: DC | PRN

## 2020-05-22 MED ORDER — ISOSORBIDE MONONITRATE ER 30 MG PO TB24: 15.0000 mg | ORAL_TABLET | Freq: Every day | ORAL | 3 refills | Status: DC

## 2020-05-22 MED ORDER — BENZTROPINE MESYLATE 1 MG PO TABS: 1.0000 mg | ORAL_TABLET | Freq: Every day | ORAL | 1 refills | Status: DC

## 2020-05-22 MED ORDER — CARVEDILOL 6.25 MG PO TABS: 6.2500 mg | ORAL_TABLET | Freq: Two times a day (BID) | ORAL | 3 refills | Status: DC

## 2020-05-22 MED ORDER — TORSEMIDE 20 MG PO TABS: 40.0000 mg | ORAL_TABLET | Freq: Every day | ORAL | 3 refills | Status: DC

## 2020-05-22 MED ORDER — HYDRALAZINE HCL 25 MG PO TABS: 25.0000 mg | ORAL_TABLET | Freq: Three times a day (TID) | ORAL | 3 refills | Status: DC

## 2020-05-22 MED ORDER — CITALOPRAM HYDROBROMIDE 20 MG PO TABS: 20.0000 mg | ORAL_TABLET | Freq: Every day | ORAL | 1 refills | Status: DC

## 2020-05-22 NOTE — Patient Instructions (Signed)
Medication Instructions:  Continue current medications  *If you need a refill on your cardiac medications before your next appointment, please call your pharmacy*   Lab Work: None Ordered    Testing/Procedures: None Ordered   Follow-Up: At [facility], you and your health needs are our priority.  As part of our continuing mission to provide you with exceptional heart care, we have created designated Provider Care Teams.  These Care Teams include your primary Cardiologist (physician) and Advanced Practice Providers (APPs -  Physician Assistants and Nurse Practitioners) who all work together to provide you with the care you need, when you need it.  We recommend signing up for the patient portal called "MyChart".  Sign up information is provided on this After Visit Summary.  MyChart is used to connect with patients for Virtual Visits (Telemedicine).  Patients are able to view lab/test results, encounter notes, upcoming appointments, etc.  Non-urgent messages can be sent to your provider as well.   To learn more about what you can do with MyChart, go to [email or web address].    Your next appointment:   6 month(s)  The format for your next appointment:   In Person  Provider:   You may see Rollene Rotunda, MD or one of the following Advanced Practice Providers on your designated Care Team:    Theodore Demark, PA-C  Joni Reining, DNP, ANP

## 2020-06-23 ENCOUNTER — Other Ambulatory Visit: Payer: Self-pay | Admitting: Adult Health

## 2020-09-18 ENCOUNTER — Telehealth: Payer: Self-pay | Admitting: *Deleted

## 2020-09-18 ENCOUNTER — Other Ambulatory Visit: Payer: Self-pay

## 2020-09-18 ENCOUNTER — Encounter: Payer: Self-pay | Admitting: *Deleted

## 2020-09-18 ENCOUNTER — Telehealth: Payer: Self-pay | Admitting: Cardiology

## 2020-09-18 MED ORDER — CARVEDILOL 6.25 MG PO TABS: 6.2500 mg | ORAL_TABLET | Freq: Two times a day (BID) | ORAL | 3 refills | Status: DC

## 2020-09-18 NOTE — Telephone Encounter (Signed)
*  STAT* If patient is at the pharmacy, call can be transferred to refill team.   1. Which medications need to be refilled? (please list name of each medication and dose if known)   albuterol (VENTOLIN HFA) 108 (90 Base) MCG/ACT inhaler  benztropine (COGENTIN) 1 MG tablet  carvedilol (COREG) 6.25 MG tablet  citalopram (CELEXA) 20 MG tablet  FLOVENT HFA 110 MCG/ACT inhaler  gabapentin (NEURONTIN) 400 MG capsule  Vitamin D, Ergocalciferol, (DRISDOL) 1.25 MG (50000 UNIT) CAPS    2. Which pharmacy/location (including street and city if local pharmacy) is medication to be sent to? [facility] - [place], [place] - [address]  3. Do they need a 30 day or 90 day supply?   albuterol (VENTOLIN HFA) 108 (90 Base) MCG/ACT inhaler 30 day supply  benztropine (COGENTIN) 1 MG tablet 30 days  carvedilol (COREG) 6.25 MG tablet 90  day supply  citalopram (CELEXA) 20 MG tablet 30 day supply     FLOVENT HFA 110 MCG/ACT inhaler  gabapentin (NEURONTIN) 400 MG capsule 30 day supply   Vitamin D, Ergocalciferol, (DRISDOL) 1.25 MG (50000 UNIT) CAPS capsule  30 day supply

## 2020-09-18 NOTE — Telephone Encounter (Signed)
Per client, he called his pharmacy and medication refill was not sent in. Called [facility] again and spoke with [facility]. Only one medication is being refilled at this time. They are working on prescriptions and unable to clarify  what medications will be refilled. Client will check with pharmacy and come back to shelter on Monday if medications are not at pharmacy.

## 2020-09-18 NOTE — Telephone Encounter (Signed)
Called Dr Antoine Poche to request refills at [facility] and schedule appt for Aug 16th at 1:40.

## 2020-09-18 NOTE — Congregational Nurse Program (Signed)
  Dept: [phone]   Congregational Nurse Program Note  Date of Encounter: 09/18/2020  Past Medical History: Past Medical History:  Diagnosis Date   Acid reflux    Alcohol abuse    Arthritis    Asthma    Back pain    Bronchitis    CKD (chronic kidney disease), stage III (HCC)    Cocaine abuse (HCC)    COPD (chronic obstructive pulmonary disease) (HCC)    History of noncompliance with medical treatment, presenting hazards to health    Homelessness    Hypertension    LV (left ventricular) mural thrombus    Myocardial infarction (HCC)    Neuropathic pain    NICM (nonischemic cardiomyopathy) (HCC) 2019   NSVT (nonsustained ventricular tachycardia) (HCC)    Pericardial effusion    Pulmonary hypertension (HCC)    RVF (right ventricular failure) (HCC)    Schizo affective schizophrenia (HCC)     Encounter Details:  CNP Questionnaire - 09/18/20 1127       Questionnaire   Do you give verbal consent to treat you today? Yes    Visit Setting Church or Organization    Location Patient Served At [facility]    Patient Status Homeless    Medical Provider No    Insurance Medicaid;VA Insurance    Intervention Refer;Support    Housing/Utilities No permanent housing    Referrals PCP - [place]   [facility]             Client came in [facility] requesting help with a doctor's appt and said he was running out of his heart medication. Called [facility] and requested refills at [facility]. Made an appt for August 16th 1:40 Darcell Sabino W RN CN [phone]

## 2020-09-18 NOTE — Telephone Encounter (Signed)
Allen Mayer was following up on the refill request. Patient is out of his medications

## 2020-10-14 ENCOUNTER — Emergency Department (HOSPITAL_COMMUNITY)
Admission: EM | Admit: 2020-10-14 | Discharge: 2020-10-14 | Disposition: A | Discharge status: Home / Self Care | Payer: Medicaid Other | Attending: Emergency Medicine | Admitting: Emergency Medicine

## 2020-10-14 ENCOUNTER — Other Ambulatory Visit: Payer: Self-pay

## 2020-10-14 ENCOUNTER — Encounter (HOSPITAL_COMMUNITY): Payer: Self-pay

## 2020-10-14 ENCOUNTER — Emergency Department (HOSPITAL_COMMUNITY): Payer: Medicaid Other

## 2020-10-14 DIAGNOSIS — M79671 Pain in right foot: Secondary | ICD-10-CM | POA: Insufficient documentation

## 2020-10-14 DIAGNOSIS — I5043 Acute on chronic combined systolic (congestive) and diastolic (congestive) heart failure: Secondary | ICD-10-CM | POA: Diagnosis not present

## 2020-10-14 DIAGNOSIS — I130 Hypertensive heart and chronic kidney disease with heart failure and stage 1 through stage 4 chronic kidney disease, or unspecified chronic kidney disease: Secondary | ICD-10-CM | POA: Diagnosis not present

## 2020-10-14 DIAGNOSIS — J449 Chronic obstructive pulmonary disease, unspecified: Secondary | ICD-10-CM | POA: Diagnosis not present

## 2020-10-14 DIAGNOSIS — N183 Chronic kidney disease, stage 3 unspecified: Secondary | ICD-10-CM | POA: Diagnosis not present

## 2020-10-14 DIAGNOSIS — F17210 Nicotine dependence, cigarettes, uncomplicated: Secondary | ICD-10-CM | POA: Diagnosis not present

## 2020-10-14 DIAGNOSIS — J45909 Unspecified asthma, uncomplicated: Secondary | ICD-10-CM | POA: Insufficient documentation

## 2020-10-14 HISTORY — DX: Heart failure, unspecified: I50.9

## 2020-10-14 LAB — CBC WITH DIFFERENTIAL/PLATELET
Abs Immature Granulocytes: 0.02 10*3/uL (ref 0.00–0.07)
Basophils Absolute: 0 10*3/uL (ref 0.0–0.1)
Basophils Relative: 1 %
Eosinophils Absolute: 0.2 10*3/uL (ref 0.0–0.5)
Eosinophils Relative: 2 %
HCT: 47.3 % (ref 39.0–52.0)
Hemoglobin: 15 g/dL (ref 13.0–17.0)
Immature Granulocytes: 0 %
Lymphocytes Relative: 23 %
Lymphs Abs: 1.8 10*3/uL (ref 0.7–4.0)
MCH: 29.5 pg (ref 26.0–34.0)
MCHC: 31.7 g/dL (ref 30.0–36.0)
MCV: 92.9 fL (ref 80.0–100.0)
Monocytes Absolute: 0.8 10*3/uL (ref 0.1–1.0)
Monocytes Relative: 10 %
Neutro Abs: 5.1 10*3/uL (ref 1.7–7.7)
Neutrophils Relative %: 64 %
Platelets: 188 10*3/uL (ref 150–400)
RBC: 5.09 MIL/uL (ref 4.22–5.81)
RDW: 14.5 % (ref 11.5–15.5)
WBC: 7.9 10*3/uL (ref 4.0–10.5)
nRBC: 0 % (ref 0.0–0.2)

## 2020-10-14 LAB — BASIC METABOLIC PANEL
Anion gap: 6 (ref 5–15)
BUN: 16 mg/dL (ref 8–23)
CO2: 28 mmol/L (ref 22–32)
Calcium: 9.2 mg/dL (ref 8.9–10.3)
Chloride: 104 mmol/L (ref 98–111)
Creatinine, Ser: 1.06 mg/dL (ref 0.61–1.24)
GFR, Estimated: >60 mL/min (ref >60)
Glucose, Bld: 106 mg/dL — ABNORMAL HIGH (ref 70–99)
Potassium: 4.4 mmol/L (ref 3.5–5.1)
Sodium: 138 mmol/L (ref 135–145)

## 2020-10-14 LAB — URIC ACID: Uric Acid, Serum: 8.4 mg/dL (ref 3.7–8.6)

## 2020-10-14 MED ORDER — HYDROCODONE-ACETAMINOPHEN 5-325 MG PO TABS: 1.0000 | ORAL_TABLET | ORAL | 0 refills | Status: DC | PRN

## 2020-10-14 MED ORDER — HYDROCODONE-ACETAMINOPHEN 5-325 MG PO TABS
1.0000 | ORAL_TABLET | Freq: Once | ORAL | Status: AC
  Administered 2020-10-14: 09:00:00 1 via ORAL
  Filled 2020-10-14: qty 1

## 2020-10-14 NOTE — ED Notes (Signed)
Pt asked x 4 if he could go outside and smoke a cigarette. Advised pt that this is a smoke-free facility and if he leaves the ED, he would have to check back in.

## 2020-10-14 NOTE — ED Provider Notes (Addendum)
[facility]EMERGENCY DEPT Provider Note   CSN: [id number] Arrival date & time: 10/14/20  3382     History Chief Complaint  Patient presents with   Leg Pain    Allen Mayer is a 60 y.o. male.  HPI He presents for pain in both feet, initially left, now only on the right.  Onset of this pain 2 days ago.  It is associated with swelling in the right foot.  He also has some soreness in his right knee over the last couple days.  No known trauma.  He states he takes medication for swelling but it has not helped the discomfort in his right foot.  He denies shortness of breath, chest pain, focal weakness or paresthesia.  He denies fever, chills, nausea or vomiting.  There are no other known modifying factors.    Past Medical History:  Diagnosis Date   Acid reflux    Alcohol abuse    Arthritis    Asthma    Back pain    Bronchitis    CHF (congestive heart failure) (HCC)    CKD (chronic kidney disease), stage III (HCC)    Cocaine abuse (HCC)    COPD (chronic obstructive pulmonary disease) (HCC)    History of noncompliance with medical treatment, presenting hazards to health    Homelessness    Hypertension    LV (left ventricular) mural thrombus    Myocardial infarction [facility])    Neuropathic pain    NICM (nonischemic cardiomyopathy) (HCC) 2019   NSVT (nonsustained ventricular tachycardia) (HCC)    Pericardial effusion    Pulmonary hypertension (HCC)    RVF (right ventricular failure) (HCC)    Schizo affective schizophrenia (HCC)     Patient Active Problem List   Diagnosis Date Noted   Acute exacerbation of congestive heart failure (HCC) 02/28/2020   Chronic systolic HF (heart failure) (HCC) 02/23/2020   Long term (current) use of anticoagulants 11/14/2019   RUQ pain    Acute on chronic combined systolic and diastolic CHF (congestive heart failure) (HCC) 10/28/2019   LV (left ventricular) mural thrombus    Acute exacerbation of CHF (congestive heart  failure) (HCC) 10/27/2019   Acute respiratory failure with hypoxia (HCC) 10/27/2019   SOB (shortness of breath)    Coronary artery calcification seen on CAT scan    DCM (dilated cardiomyopathy) (HCC)    Acute CHF (congestive heart failure) (HCC) 12/11/2017   Elevated troponin 12/11/2017   COPD (chronic obstructive pulmonary disease) (HCC) 12/11/2017   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) 11/19/2014   Tobacco abuse 11/19/2014   Essential hypertension, benign 11/19/2014   Neuropathic pain     Past Surgical History:  Procedure Laterality Date   None     RIGHT/LEFT HEART CATH AND CORONARY ANGIOGRAPHY N/A 12/13/2017   Procedure: RIGHT/LEFT HEART CATH AND CORONARY ANGIOGRAPHY;  Surgeon: Runell Gess, MD;  Location: [facility] INVASIVE CV LAB;  Service: Cardiovascular;  Laterality: N/A;       Family History  Problem Relation Age of Onset   Heart attack Mother        Died age 1   Heart attack Brother        3    Social History   Tobacco Use   Smoking status: Every Day    Packs/day: 0.50    Pack years: 0.00    Types: Cigarettes   Smokeless tobacco: Never  Vaping Use   Vaping Use: Never used  Substance Use Topics  Alcohol use: Yes    Comment: occas   Drug use: Not Currently    Types: Cocaine, Marijuana    Home Medications Prior to Admission medications   Medication Sig Start Date End Date Taking? Authorizing Provider  HYDROcodone-acetaminophen (NORCO/VICODIN) 5-325 MG tablet Take 1 tablet by mouth every 4 (four) hours as needed for moderate pain. 10/14/20  Yes Mancel Bale, MD  albuterol (VENTOLIN HFA) 108 (90 Base) MCG/ACT inhaler Inhale 2 puffs into the lungs every 4 (four) hours as needed for wheezing or shortness of breath. 12/04/19   Rollene Rotunda, MD  ARIPiprazole (ABILIFY) 10 MG tablet Take 10 mg by mouth 2 (two) times daily. 01/08/20   [provider]  benztropine (COGENTIN) 1 MG tablet Take 1 tablet (1 mg total) by  mouth at bedtime. 05/22/20   Jodelle Gross, NP  carvedilol (COREG) 6.25 MG tablet Take 1 tablet (6.25 mg total) by mouth 2 (two) times daily. 09/18/20   Jodelle Gross, NP  citalopram (CELEXA) 20 MG tablet Take 1 tablet (20 mg total) by mouth daily. 05/22/20   Jodelle Gross, NP  FLOVENT HFA 110 MCG/ACT inhaler Inhale 2 puffs into the lungs 2 (two) times daily. 12/04/19   Rollene Rotunda, MD  gabapentin (NEURONTIN) 400 MG capsule Take 1 capsule (400 mg total) by mouth daily as needed (nerve pain). 05/22/20   Jodelle Gross, NP  hydrALAZINE (APRESOLINE) 25 MG tablet Take 1 tablet (25 mg total) by mouth every 8 (eight) hours. 05/22/20 07/21/20  Jodelle Gross, NP  isosorbide mononitrate (IMDUR) 30 MG 24 hr tablet Take 0.5 tablets (15 mg total) by mouth daily. 05/22/20 07/21/20  Jodelle Gross, NP  nitroGLYCERIN (NITROSTAT) 0.4 MG SL tablet Place 1 tablet (0.4 mg total) under the tongue every 5 (five) minutes as needed for chest pain. 12/04/19   Rollene Rotunda, MD  oxyCODONE (OXY IR/ROXICODONE) 5 MG immediate release tablet Take 1 tablet (5 mg total) by mouth 3 (three) times daily as needed (pain). 11/02/19   Amin, Loura Halt, MD  torsemide (DEMADEX) 20 MG tablet Take 2 tablets (40 mg total) by mouth daily. 05/22/20 07/21/20  Jodelle Gross, NP  Vitamin D, Ergocalciferol, (DRISDOL) 1.25 MG (50000 UNIT) CAPS capsule Take 50,000 Units by mouth once a week. 01/24/20   [provider]    Allergies    Patient has no known allergies.  Review of Systems   Review of Systems  All other systems reviewed and are negative.  Physical Exam Updated Vital Signs BP 131/85   Pulse 70   Temp 97.8 F (36.6 C) (Oral)   Resp 18   Ht 6' (1.829 m)   Wt 93 kg   SpO2 100%   BMI 27.80 kg/m   Physical Exam Vitals and nursing note reviewed.  Constitutional:      General: He is not in acute distress.    Appearance: He is well-developed. He is not ill-appearing or diaphoretic.   HENT:     Head: Normocephalic and atraumatic.     Right Ear: External ear normal.     Left Ear: External ear normal.     Nose: No congestion or rhinorrhea.     Mouth/Throat:     Pharynx: No oropharyngeal exudate.  Eyes:     Conjunctiva/sclera: Conjunctivae normal.     Pupils: Pupils are equal, round, and reactive to light.  Neck:     Trachea: Phonation normal.  Cardiovascular:     Rate and Rhythm: Normal rate.  Pulmonary:     Effort: Pulmonary effort is normal.  Abdominal:     General: There is no distension.     Palpations: Abdomen is soft.  Musculoskeletal:        General: Normal range of motion.     Cervical back: Normal range of motion and neck supple.     Comments: Right foot is tender and swollen primarily in the midfoot.  No associated right ankle swelling.  Right knee is tender without deformity or swelling.  Guards against moving the right knee secondary to pain.  Has difficulty bearing weight on the right foot secondary to pain in the right midfoot.  Neurovascular intact distally in the right toes.  No evidence for ischemia of the toes.  Skin:    General: Skin is warm and dry.  Neurological:     Mental Status: He is alert and oriented to person, place, and time.     Cranial Nerves: No cranial nerve deficit.     Sensory: No sensory deficit.     Motor: No abnormal muscle tone.     Coordination: Coordination normal.  Psychiatric:        Mood and Affect: Mood normal.        Behavior: Behavior normal.        Thought Content: Thought content normal.        Judgment: Judgment normal.    ED Results / Procedures / Treatments   Labs (all labs ordered are listed, but only abnormal results are displayed) Labs Reviewed  BASIC METABOLIC PANEL - Abnormal; Notable for the following components:      Result Value   Glucose, Bld 106 (*)    All other components within normal limits  CBC WITH DIFFERENTIAL/PLATELET  URIC ACID    EKG None  Radiology DG Knee Complete 4 Views  Right  Result Date: 10/14/2020 CLINICAL DATA:  60 year old male with right lower extremity pain for several days. EXAM: RIGHT KNEE - COMPLETE 4+ VIEW COMPARISON:  None. FINDINGS: No evidence of fracture, dislocation, or joint effusion. No evidence of arthropathy or other focal bone abnormality. Mild prepatellar soft tissue prominence. No radiopaque foreign bodies. Atherosclerotic calcifications are noted. IMPRESSION: No acute fracture or malalignment. Prepatellar soft tissue prominence as could be seen with bursitis or soft tissue skin infection. Electronically Signed   By: Marliss Coots MD   On: 10/14/2020 09:15   DG Foot Complete Right  Result Date: 10/14/2020 CLINICAL DATA:  60 year old male with right lower extremity pain for several weeks. EXAM: RIGHT FOOT COMPLETE - 3+ VIEW COMPARISON:  None. FINDINGS: There is no evidence of fracture or dislocation. There is no evidence of arthropathy or other focal bone abnormality. Soft tissues are unremarkable. IMPRESSION: No acute fracture, malalignment, or significant degenerative change. Electronically Signed   By: Marliss Coots MD   On: 10/14/2020 09:13    Procedures Procedures   Medications Ordered in ED Medications  HYDROcodone-acetaminophen (NORCO/VICODIN) 5-325 MG per tablet 1 tablet (1 tablet Oral Given 10/14/20 2952)    ED Course  I have reviewed the triage vital signs and the nursing notes.  Pertinent labs & imaging results that were available during my care of the patient were reviewed by me and considered in my medical decision making (see chart for details).    MDM Rules/Calculators/A&P                           Patient Vitals for the past 24  hrs:  BP Temp Temp src Pulse Resp SpO2 Height Weight  10/14/20 0845 131/85 -- -- 70 -- 100 % -- --  10/14/20 0841 (!) 144/84 -- -- -- -- -- -- --  10/14/20 0837 -- -- -- -- -- -- 6' (1.829 m) 93 kg  10/14/20 0836 (!) 145/113 97.8 F (36.6 C) Oral 71 18 100 % -- --    10:55 AM Reevaluation  with update and discussion. After initial assessment and treatment, an updated evaluation reveals no change in clinical status.  Findings discussed with the patient.  I recommended prednisone for a probable inflammatory process.  He declined stating that it causes him to have heartburn.  His wife is with him now.  We discussed symptomatic treatment.  He is agreeable.  Questions answered. Mancel Bale   Medical Decision Making:  This patient is presenting for evaluation of pain and swelling right foot, which does require a range of treatment options, and is a complaint that involves a moderate risk of morbidity and mortality. The differential diagnoses include occult fracture, arthritis, gout. I decided to review old records, and in summary Young elderly male with history of cardiac disease, cardiomyopathy, congestive heart failure, COPD, and neuropathy.  I did not require additional historical information from any.  Clinical Laboratory Tests Ordered, included CBC, Metabolic panel, and uric acid . Review indicates normal. Radiologic Tests Ordered, included x-ray right knee, right foot.  I independently Visualized: Radiographic images, which show normal images    Critical Interventions-clinical evaluation, laboratory testing, radiography, medication treatment, observation and reassessment  After These Interventions, the Patient was reevaluated and was found stable for discharge.  Nonspecific swelling of feet, intermittent, without known trauma.  Doubt DVT, acute congestive heart failure or acute bony disorder.  Suspect nonspecific arthritis.  No indication for hospitalization or further ED evaluation at this time.  He will be instructed follow-up with his PCP if having persistent symptoms after period of symptomatic care.  CRITICAL CARE-no Performed by: Mancel Bale  Nursing Notes Reviewed/ Care Coordinated Applicable Imaging Reviewed Interpretation of Laboratory Data incorporated into ED  treatment  The patient appears reasonably screened and/or stabilized for discharge and I doubt any other medical condition or other [facility] requiring further screening, evaluation, or treatment in the ED at this time prior to discharge.  Plan: Home Medications-continue usual; Home Treatments-elevate right foot for swelling; return here if the recommended treatment, does not improve the symptoms; Recommended follow up-PCP follow-up if not better in few days.     Final Clinical Impression(s) / ED Diagnoses Final diagnoses:  Right foot pain    Rx / DC Orders ED Discharge Orders          Ordered    HYDROcodone-acetaminophen (NORCO/VICODIN) 5-325 MG tablet  Every 4 hours PRN        10/14/20 1106             Mancel Bale, MD 10/14/20 1107  After discharge, the patient requested crutches to help him ambulate.  These have been ordered.    Mancel Bale, MD 10/14/20 1116

## 2020-10-14 NOTE — ED Notes (Signed)
Gave pt crutches, instructed pt how to use crutches, pt is ambulatory w crutches, went over discharge instructions, rx sent to pharmacy, pt ready for discharge

## 2020-10-14 NOTE — Discharge Instructions (Addendum)
It is not clear what is causing the swelling and pain of the right foot.  You may have a type of arthritis.  At this point the best treatment is elevation, and time.  Hopefully the condition will improve.  Sometimes these are related to inflammation so an anti-inflammatory such as ibuprofen could be helpful.  Call your doctor if not improving in the next couple days for further care and treatment.

## 2020-10-14 NOTE — ED Triage Notes (Signed)
Pt c/o RLE pain for several days, EMS states leg is red and warm to touch. Pt has hx of neuropathy, CHF, and htn. Pt is unable to put weight on leg. Denies injury/trauma

## 2020-10-25 ENCOUNTER — Observation Stay (HOSPITAL_COMMUNITY)
Admission: EM | Admit: 2020-10-25 | Discharge: 2020-10-26 | Disposition: A | Discharge status: Home / Self Care | Payer: Medicaid Other | Attending: Internal Medicine | Admitting: Internal Medicine

## 2020-10-25 DIAGNOSIS — N182 Chronic kidney disease, stage 2 (mild): Secondary | ICD-10-CM | POA: Diagnosis present

## 2020-10-25 DIAGNOSIS — I9589 Other hypotension: Secondary | ICD-10-CM | POA: Diagnosis present

## 2020-10-25 DIAGNOSIS — F1410 Cocaine abuse, uncomplicated: Secondary | ICD-10-CM | POA: Diagnosis present

## 2020-10-25 DIAGNOSIS — F17210 Nicotine dependence, cigarettes, uncomplicated: Secondary | ICD-10-CM | POA: Diagnosis not present

## 2020-10-25 DIAGNOSIS — I130 Hypertensive heart and chronic kidney disease with heart failure and stage 1 through stage 4 chronic kidney disease, or unspecified chronic kidney disease: Secondary | ICD-10-CM | POA: Diagnosis not present

## 2020-10-25 DIAGNOSIS — E861 Hypovolemia: Secondary | ICD-10-CM | POA: Diagnosis present

## 2020-10-25 DIAGNOSIS — J449 Chronic obstructive pulmonary disease, unspecified: Secondary | ICD-10-CM | POA: Diagnosis not present

## 2020-10-25 DIAGNOSIS — Y900 Blood alcohol level of less than 20 mg/100 ml: Secondary | ICD-10-CM | POA: Insufficient documentation

## 2020-10-25 DIAGNOSIS — J45909 Unspecified asthma, uncomplicated: Secondary | ICD-10-CM | POA: Diagnosis not present

## 2020-10-25 DIAGNOSIS — M7989 Other specified soft tissue disorders: Secondary | ICD-10-CM | POA: Diagnosis present

## 2020-10-25 DIAGNOSIS — R4182 Altered mental status, unspecified: Secondary | ICD-10-CM | POA: Diagnosis not present

## 2020-10-25 DIAGNOSIS — R55 Syncope and collapse: Principal | ICD-10-CM | POA: Diagnosis present

## 2020-10-25 DIAGNOSIS — Z79899 Other long term (current) drug therapy: Secondary | ICD-10-CM | POA: Diagnosis not present

## 2020-10-25 DIAGNOSIS — N179 Acute kidney failure, unspecified: Secondary | ICD-10-CM | POA: Diagnosis present

## 2020-10-25 DIAGNOSIS — I5042 Chronic combined systolic (congestive) and diastolic (congestive) heart failure: Secondary | ICD-10-CM | POA: Diagnosis not present

## 2020-10-25 DIAGNOSIS — N183 Chronic kidney disease, stage 3 unspecified: Secondary | ICD-10-CM | POA: Insufficient documentation

## 2020-10-25 DIAGNOSIS — R2231 Localized swelling, mass and lump, right upper limb: Secondary | ICD-10-CM | POA: Insufficient documentation

## 2020-10-25 DIAGNOSIS — M79641 Pain in right hand: Secondary | ICD-10-CM

## 2020-10-25 DIAGNOSIS — I513 Intracardiac thrombosis, not elsewhere classified: Secondary | ICD-10-CM | POA: Diagnosis present

## 2020-10-25 DIAGNOSIS — I959 Hypotension, unspecified: Secondary | ICD-10-CM | POA: Diagnosis not present

## 2020-10-25 DIAGNOSIS — Z20822 Contact with and (suspected) exposure to covid-19: Secondary | ICD-10-CM | POA: Diagnosis not present

## 2020-10-25 DIAGNOSIS — F259 Schizoaffective disorder, unspecified: Secondary | ICD-10-CM | POA: Diagnosis present

## 2020-10-25 NOTE — ED Triage Notes (Signed)
Pt bib gems after wife called out for altered mental status. Upon EMS arrival pt was A&Ox4 and pt stated that he got dizzy and thinks he passed out. No head trauma. With EMS pt had another episode where he went unresponsive for approx 10 seconds. Initial pressure 60/40 - NS given and last pressure was 136/80. Pt states he takes labetalol Q8 and thinks he may have taken his second dose too early. Pt also c/o right hand tenderness and swelling. A&Ox4 on arrival.    HR: 50's  BP: 136/80  Spo2: 99% RA

## 2020-10-26 ENCOUNTER — Encounter (HOSPITAL_COMMUNITY): Payer: Self-pay | Admitting: Family Medicine

## 2020-10-26 ENCOUNTER — Observation Stay (HOSPITAL_BASED_OUTPATIENT_CLINIC_OR_DEPARTMENT_OTHER): Payer: Medicaid Other

## 2020-10-26 ENCOUNTER — Emergency Department (HOSPITAL_COMMUNITY): Payer: Medicaid Other

## 2020-10-26 ENCOUNTER — Observation Stay (HOSPITAL_COMMUNITY): Payer: Medicaid Other

## 2020-10-26 DIAGNOSIS — R55 Syncope and collapse: Secondary | ICD-10-CM | POA: Diagnosis not present

## 2020-10-26 DIAGNOSIS — I9589 Other hypotension: Secondary | ICD-10-CM

## 2020-10-26 DIAGNOSIS — M79641 Pain in right hand: Secondary | ICD-10-CM | POA: Insufficient documentation

## 2020-10-26 DIAGNOSIS — I5042 Chronic combined systolic (congestive) and diastolic (congestive) heart failure: Secondary | ICD-10-CM | POA: Diagnosis not present

## 2020-10-26 DIAGNOSIS — N179 Acute kidney failure, unspecified: Secondary | ICD-10-CM | POA: Diagnosis not present

## 2020-10-26 DIAGNOSIS — J431 Panlobular emphysema: Secondary | ICD-10-CM

## 2020-10-26 DIAGNOSIS — I513 Intracardiac thrombosis, not elsewhere classified: Secondary | ICD-10-CM

## 2020-10-26 DIAGNOSIS — M7989 Other specified soft tissue disorders: Secondary | ICD-10-CM | POA: Diagnosis present

## 2020-10-26 DIAGNOSIS — N182 Chronic kidney disease, stage 2 (mild): Secondary | ICD-10-CM

## 2020-10-26 DIAGNOSIS — E861 Hypovolemia: Secondary | ICD-10-CM

## 2020-10-26 DIAGNOSIS — F259 Schizoaffective disorder, unspecified: Secondary | ICD-10-CM | POA: Diagnosis present

## 2020-10-26 DIAGNOSIS — F1410 Cocaine abuse, uncomplicated: Secondary | ICD-10-CM | POA: Diagnosis present

## 2020-10-26 LAB — CBC WITH DIFFERENTIAL/PLATELET
Abs Immature Granulocytes: 0.02 10*3/uL (ref 0.00–0.07)
Basophils Absolute: 0 10*3/uL (ref 0.0–0.1)
Basophils Relative: 1 %
Eosinophils Absolute: 0.4 10*3/uL (ref 0.0–0.5)
Eosinophils Relative: 4 %
HCT: 47.4 % (ref 39.0–52.0)
Hemoglobin: 15.5 g/dL (ref 13.0–17.0)
Immature Granulocytes: 0 %
Lymphocytes Relative: 21 %
Lymphs Abs: 1.7 10*3/uL (ref 0.7–4.0)
MCH: 29.9 pg (ref 26.0–34.0)
MCHC: 32.7 g/dL (ref 30.0–36.0)
MCV: 91.3 fL (ref 80.0–100.0)
Monocytes Absolute: 0.5 10*3/uL (ref 0.1–1.0)
Monocytes Relative: 7 %
Neutro Abs: 5.6 10*3/uL (ref 1.7–7.7)
Neutrophils Relative %: 67 %
Platelets: 238 10*3/uL (ref 150–400)
RBC: 5.19 MIL/uL (ref 4.22–5.81)
RDW: 13.6 % (ref 11.5–15.5)
WBC: 8.3 10*3/uL (ref 4.0–10.5)
nRBC: 0 % (ref 0.0–0.2)

## 2020-10-26 LAB — COMPREHENSIVE METABOLIC PANEL
ALT: 20 U/L (ref 0–44)
AST: 26 U/L (ref 15–41)
Albumin: 3.5 g/dL (ref 3.5–5.0)
Alkaline Phosphatase: 91 U/L (ref 38–126)
Anion gap: 9 (ref 5–15)
BUN: 19 mg/dL (ref 8–23)
CO2: 23 mmol/L (ref 22–32)
Calcium: 8.9 mg/dL (ref 8.9–10.3)
Chloride: 102 mmol/L (ref 98–111)
Creatinine, Ser: 1.49 mg/dL — ABNORMAL HIGH (ref 0.61–1.24)
GFR, Estimated: 53 mL/min — ABNORMAL LOW (ref >60)
Glucose, Bld: 175 mg/dL — ABNORMAL HIGH (ref 70–99)
Potassium: 4.4 mmol/L (ref 3.5–5.1)
Sodium: 134 mmol/L — ABNORMAL LOW (ref 135–145)
Total Bilirubin: 1.3 mg/dL — ABNORMAL HIGH (ref 0.3–1.2)
Total Protein: 7.3 g/dL (ref 6.5–8.1)

## 2020-10-26 LAB — I-STAT CHEM 8, ED
BUN: 25 mg/dL — ABNORMAL HIGH (ref 8–23)
Calcium, Ion: 1.02 mmol/L — ABNORMAL LOW (ref 1.15–1.40)
Chloride: 102 mmol/L (ref 98–111)
Creatinine, Ser: 1.4 mg/dL — ABNORMAL HIGH (ref 0.61–1.24)
Glucose, Bld: 174 mg/dL — ABNORMAL HIGH (ref 70–99)
HCT: 49 % (ref 39.0–52.0)
Hemoglobin: 16.7 g/dL (ref 13.0–17.0)
Potassium: 4.2 mmol/L (ref 3.5–5.1)
Sodium: 135 mmol/L (ref 135–145)
TCO2: 26 mmol/L (ref 22–32)

## 2020-10-26 LAB — BASIC METABOLIC PANEL
Anion gap: 11 (ref 5–15)
BUN: 19 mg/dL (ref 8–23)
CO2: 24 mmol/L (ref 22–32)
Calcium: 8.9 mg/dL (ref 8.9–10.3)
Chloride: 100 mmol/L (ref 98–111)
Creatinine, Ser: 1.18 mg/dL (ref 0.61–1.24)
GFR, Estimated: >60 mL/min (ref >60)
Glucose, Bld: 113 mg/dL — ABNORMAL HIGH (ref 70–99)
Potassium: 4.3 mmol/L (ref 3.5–5.1)
Sodium: 135 mmol/L (ref 135–145)

## 2020-10-26 LAB — CBG MONITORING, ED
Glucose-Capillary: 101 mg/dL — ABNORMAL HIGH (ref 70–99)
Glucose-Capillary: 134 mg/dL — ABNORMAL HIGH (ref 70–99)

## 2020-10-26 LAB — TROPONIN I (HIGH SENSITIVITY)
Troponin I (High Sensitivity): 17 ng/L (ref <18)
Troponin I (High Sensitivity): 18 ng/L — ABNORMAL HIGH (ref <18)

## 2020-10-26 LAB — SARS CORONAVIRUS 2 (TAT 6-24 HRS): SARS Coronavirus 2: NEGATIVE

## 2020-10-26 LAB — D-DIMER, QUANTITATIVE: D-Dimer, Quant: 0.7 ug/mL-FEU — ABNORMAL HIGH (ref 0.00–0.50)

## 2020-10-26 LAB — ECHOCARDIOGRAM COMPLETE
Area-P 1/2: 3.42 cm2
Calc EF: 40.4 %
Height: 72 in
S' Lateral: 5.2 cm
Single Plane A2C EF: 39.7 %
Single Plane A4C EF: 42.2 %
Weight: 3200 oz

## 2020-10-26 LAB — CBC
HCT: 44.8 % (ref 39.0–52.0)
Hemoglobin: 14.6 g/dL (ref 13.0–17.0)
MCH: 29.7 pg (ref 26.0–34.0)
MCHC: 32.6 g/dL (ref 30.0–36.0)
MCV: 91.2 fL (ref 80.0–100.0)
Platelets: 234 10*3/uL (ref 150–400)
RBC: 4.91 MIL/uL (ref 4.22–5.81)
RDW: 13.5 % (ref 11.5–15.5)
WBC: 8.8 10*3/uL (ref 4.0–10.5)
nRBC: 0 % (ref 0.0–0.2)

## 2020-10-26 LAB — RAPID URINE DRUG SCREEN, HOSP PERFORMED
Amphetamines: NOT DETECTED
Barbiturates: NOT DETECTED
Benzodiazepines: NOT DETECTED
Cocaine: POSITIVE — AB
Opiates: NOT DETECTED
Tetrahydrocannabinol: NOT DETECTED

## 2020-10-26 LAB — PROTIME-INR
INR: 1 (ref 0.8–1.2)
Prothrombin Time: 12.8 seconds (ref 11.4–15.2)

## 2020-10-26 LAB — ETHANOL: Alcohol, Ethyl (B): <10 mg/dL (ref <10)

## 2020-10-26 LAB — BRAIN NATRIURETIC PEPTIDE: B Natriuretic Peptide: 347.2 pg/mL — ABNORMAL HIGH (ref 0.0–100.0)

## 2020-10-26 MED ORDER — SODIUM CHLORIDE 0.9 % IV SOLN: 250.0000 mL | INTRAVENOUS | Status: DC | PRN

## 2020-10-26 MED ORDER — GABAPENTIN 400 MG PO CAPS: 400.0000 mg | ORAL_CAPSULE | Freq: Every day | ORAL | Status: DC | PRN

## 2020-10-26 MED ORDER — BUDESONIDE 0.25 MG/2ML IN SUSP
0.2500 mg | Freq: Two times a day (BID) | RESPIRATORY_TRACT | Status: DC
  Administered 2020-10-26: 0.25 mg via RESPIRATORY_TRACT
  Filled 2020-10-26: qty 2

## 2020-10-26 MED ORDER — BENZTROPINE MESYLATE 1 MG PO TABS
1.0000 mg | ORAL_TABLET | Freq: Every day | ORAL | Status: DC
  Filled 2020-10-26: qty 1

## 2020-10-26 MED ORDER — ENOXAPARIN SODIUM 40 MG/0.4ML IJ SOSY
40.0000 mg | PREFILLED_SYRINGE | INTRAMUSCULAR | Status: DC
  Administered 2020-10-26: 40 mg via SUBCUTANEOUS
  Filled 2020-10-26: qty 0.4

## 2020-10-26 MED ORDER — SODIUM CHLORIDE 0.9 % IV BOLUS
250.0000 mL | Freq: Once | INTRAVENOUS | Status: AC
  Administered 2020-10-26: 250 mL via INTRAVENOUS

## 2020-10-26 MED ORDER — SODIUM CHLORIDE 0.9 % IV SOLN: Freq: Once | INTRAVENOUS | Status: AC

## 2020-10-26 MED ORDER — TRAZODONE HCL 50 MG PO TABS: 50.0000 mg | ORAL_TABLET | Freq: Every evening | ORAL | Status: DC | PRN

## 2020-10-26 MED ORDER — ALBUTEROL SULFATE (2.5 MG/3ML) 0.083% IN NEBU: 2.5000 mg | INHALATION_SOLUTION | RESPIRATORY_TRACT | Status: DC | PRN

## 2020-10-26 MED ORDER — FLUTICASONE PROPIONATE HFA 110 MCG/ACT IN AERO: 2.0000 | INHALATION_SPRAY | Freq: Two times a day (BID) | RESPIRATORY_TRACT | Status: DC

## 2020-10-26 MED ORDER — ACETAMINOPHEN 650 MG RE SUPP: 650.0000 mg | Freq: Four times a day (QID) | RECTAL | Status: DC | PRN

## 2020-10-26 MED ORDER — ACETAMINOPHEN 325 MG PO TABS: 650.0000 mg | ORAL_TABLET | Freq: Four times a day (QID) | ORAL | Status: DC | PRN

## 2020-10-26 MED ORDER — ARIPIPRAZOLE 5 MG PO TABS: 10.0000 mg | ORAL_TABLET | Freq: Two times a day (BID) | ORAL | Status: DC

## 2020-10-26 MED ORDER — CITALOPRAM HYDROBROMIDE 20 MG PO TABS: 20.0000 mg | ORAL_TABLET | Freq: Every day | ORAL | Status: DC

## 2020-10-26 NOTE — ED Notes (Signed)
Pt noted to have an brief period where his HR ranged from 37-42 while sleeping. Pt's HR rose to the 50's when woken up. Pt not complaining of any accompanying symptoms. MD notified.

## 2020-10-26 NOTE — Progress Notes (Signed)
Patient verbalized that he feels ok and wants to [facility]. RN reads AMA letter  to the patient and to patient fiance. Pt verbalized understanding. Pt ask fiance sign the paper for him due to swollen hand.

## 2020-10-26 NOTE — ED Provider Notes (Signed)
[facility] EMERGENCY DEPARTMENT Provider Note  CSN: [id number] Arrival date & time: 10/25/20 2313  Chief Complaint(s) Altered Mental Status and Hypotension  HPI Allen Mayer is a 60 y.o. male with extensive past medical history listed below including chronic combined systolic and diastolic heart failure with a last EF of 20% (on diuretics) who is also noted to have an LV thrombus not on anticoagulation.  Patient has a history of noncompliance but reports that he has been compliant with his home medications.  Patient also has a history of cocaine abuse and admits to using cocaine 3 days ago.  Denies any alcohol or illicit drug use since then.  Patient also reports that he did not smoke any cigarettes in that time..  Reports that tonight he had a syncopal episode after smoking a cigarette.  He reports feeling lightheaded after smoking and passing out.  Patient denied any overt chest pain or shortness of breath.  He is endorsing mild abdominal discomfort in the epigastrium and "rumbling" likely related to hunger.  He denies recent fevers or infections.  No coughing or congestion.  No edema.  EMS was called out due to altered mental status during a syncopal episode.  They noted the patient's blood pressure was in the 60s during their initial evaluation.  This improved after he received 600 cc of IV fluids.  The history is provided by the patient.   Past Medical History Past Medical History:  Diagnosis Date  . Acid reflux   . Alcohol abuse   . Arthritis   . Asthma   . Back pain   . Bronchitis   . CHF (congestive heart failure) (HCC)   . CKD (chronic kidney disease), stage III (HCC)   . Cocaine abuse (HCC)   . COPD (chronic obstructive pulmonary disease) (HCC)   . History of noncompliance with medical treatment, presenting hazards to health   . Homelessness   . Hypertension   . LV (left ventricular) mural thrombus   . Myocardial infarction (HCC)   . Neuropathic pain    . NICM (nonischemic cardiomyopathy) (HCC) 2019  . NSVT (nonsustained ventricular tachycardia) (HCC)   . Pericardial effusion   . Pulmonary hypertension (HCC)   . RVF (right ventricular failure) (HCC)   . Schizo affective schizophrenia [facility])    Patient Active Problem List   Diagnosis Date Noted  . Syncope 10/26/2020  . Swelling of right hand 10/26/2020  . Schizo affective schizophrenia (HCC)   . Cocaine abuse (HCC)   . Acute renal failure superimposed on stage 2 chronic kidney disease (HCC)   . Hypotension due to hypovolemia   . Right hand pain   . Acute exacerbation of congestive heart failure (HCC) 02/28/2020  . Chronic systolic HF (heart failure) (HCC) 02/23/2020  . Long term (current) use of anticoagulants 11/14/2019  . RUQ pain   . Chronic combined systolic and diastolic CHF (congestive heart failure) (HCC) 10/28/2019  . LV (left ventricular) mural thrombus   . Acute exacerbation of CHF (congestive heart failure) (HCC) 10/27/2019  . SOB (shortness of breath)   . Coronary artery calcification seen on CAT scan   . DCM (dilated cardiomyopathy) (HCC)   . Acute CHF (congestive heart failure) (HCC) 12/11/2017  . Elevated troponin 12/11/2017  . COPD (chronic obstructive pulmonary disease) (HCC) 12/11/2017  . 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 Tobacco abuse 11/19/2014  . Essential hypertension, benign 11/19/2014  . Neuropathic pain    Home Medication(s)  Prior to Admission medications   Medication Sig Start Date End Date Taking? Authorizing Provider  albuterol (VENTOLIN HFA) 108 (90 Base) MCG/ACT inhaler Inhale 2 puffs into the lungs every 4 (four) hours as needed for wheezing or shortness of breath. 12/04/19   Rollene Rotunda, MD  ARIPiprazole (ABILIFY) 10 MG tablet Take 10 mg by mouth 2 (two) times daily. 01/08/20   [provider]  benztropine (COGENTIN) 1 MG tablet Take 1 tablet (1 mg total) by mouth at bedtime.  05/22/20   Jodelle Gross, NP  carvedilol (COREG) 6.25 MG tablet Take 1 tablet (6.25 mg total) by mouth 2 (two) times daily. 09/18/20   Jodelle Gross, NP  citalopram (CELEXA) 20 MG tablet Take 1 tablet (20 mg total) by mouth daily. 05/22/20   Jodelle Gross, NP  FLOVENT HFA 110 MCG/ACT inhaler Inhale 2 puffs into the lungs 2 (two) times daily. 12/04/19   Rollene Rotunda, MD  gabapentin (NEURONTIN) 400 MG capsule Take 1 capsule (400 mg total) by mouth daily as needed (nerve pain). 05/22/20   Jodelle Gross, NP  hydrALAZINE (APRESOLINE) 25 MG tablet Take 1 tablet (25 mg total) by mouth every 8 (eight) hours. 05/22/20 07/21/20  Jodelle Gross, NP  HYDROcodone-acetaminophen (NORCO/VICODIN) 5-325 MG tablet Take 1 tablet by mouth every 4 (four) hours as needed for moderate pain. 10/14/20   Mancel Bale, MD  isosorbide mononitrate (IMDUR) 30 MG 24 hr tablet Take 0.5 tablets (15 mg total) by mouth daily. 05/22/20 07/21/20  Jodelle Gross, NP  nitroGLYCERIN (NITROSTAT) 0.4 MG SL tablet Place 1 tablet (0.4 mg total) under the tongue every 5 (five) minutes as needed for chest pain. 12/04/19   Rollene Rotunda, MD  oxyCODONE (OXY IR/ROXICODONE) 5 MG immediate release tablet Take 1 tablet (5 mg total) by mouth 3 (three) times daily as needed (pain). 11/02/19   Amin, Loura Halt, MD  torsemide (DEMADEX) 20 MG tablet Take 2 tablets (40 mg total) by mouth daily. 05/22/20 07/21/20  Jodelle Gross, NP  Vitamin D, Ergocalciferol, (DRISDOL) 1.25 MG (50000 UNIT) CAPS capsule Take 50,000 Units by mouth once a week. 01/24/20   [provider]                                                                                                                                    Past Surgical History Past Surgical History:  Procedure Laterality Date  . None    . RIGHT/LEFT HEART CATH AND CORONARY ANGIOGRAPHY N/A 12/13/2017   Procedure: RIGHT/LEFT HEART CATH AND CORONARY ANGIOGRAPHY;  Surgeon: Runell Gess, MD;  Location: [facility] INVASIVE CV LAB;  Service: Cardiovascular;  Laterality: N/A;   Family History Family History  Problem Relation Age of Onset  . Heart attack Mother        Died age 16  . Heart attack Brother        40  Social History Social History   Tobacco Use  . Smoking status: Every Day    Packs/day: 0.50    Types: Cigarettes  . Smokeless tobacco: Never  Vaping Use  . Vaping Use: Never used  Substance Use Topics  . Alcohol use: Yes    Comment: occas  . Drug use: Not Currently    Types: Cocaine, Marijuana   Allergies Patient has no known allergies.  Review of Systems Review of Systems All other systems are reviewed and are negative for acute change except as noted in the HPI  Physical Exam Vital Signs  I have reviewed the triage vital signs BP 100/69   Pulse 60   Temp 97.9 F (36.6 C) (Oral)   Resp (!) 23   Ht 6' (1.829 m)   Wt 90.7 kg   SpO2 93%   BMI 27.12 kg/m   Physical Exam Vitals reviewed.  Constitutional:      General: He is not in acute distress.    Appearance: He is well-developed. He is not diaphoretic.  HENT:     Head: Normocephalic and atraumatic.     Nose: Nose normal.  Eyes:     General: No scleral icterus.       Right eye: No discharge.        Left eye: No discharge.     Conjunctiva/sclera: Conjunctivae normal.     Pupils: Pupils are equal, round, and reactive to light.  Cardiovascular:     Rate and Rhythm: Normal rate and regular rhythm.     Heart sounds: No murmur heard.   No friction rub. No gallop.  Pulmonary:     Effort: Pulmonary effort is normal. No respiratory distress.     Breath sounds: Normal breath sounds. No stridor. No rales.  Abdominal:     General: There is no distension.     Palpations: Abdomen is soft.     Tenderness: There is no abdominal tenderness.  Musculoskeletal:        General: No tenderness.     Cervical back: Normal range of motion and neck supple.  Skin:    General: Skin is warm  and dry.     Findings: No erythema or rash.  Neurological:     Mental Status: He is alert and oriented to person, place, and time.    ED Results and Treatments Labs (all labs ordered are listed, but only abnormal results are displayed) Labs Reviewed  COMPREHENSIVE METABOLIC PANEL - Abnormal; Notable for the following components:      Result Value   Sodium 134 (*)    Glucose, Bld 175 (*)    Creatinine, Ser 1.49 (*)    Total Bilirubin 1.3 (*)    GFR, Estimated 53 (*)    All other components within normal limits  BRAIN NATRIURETIC PEPTIDE - Abnormal; Notable for the following components:   B Natriuretic Peptide 347.2 (*)    All other components within normal limits  D-DIMER, QUANTITATIVE - Abnormal; Notable for the following components:   D-Dimer, Quant 0.70 (*)    All other components within normal limits  RAPID URINE DRUG SCREEN, HOSP PERFORMED - Abnormal; Notable for the following components:   Cocaine POSITIVE (*)    All other components within normal limits  CBG MONITORING, ED - Abnormal; Notable for the following components:   Glucose-Capillary 101 (*)    All other components within normal limits  I-STAT CHEM 8, ED - Abnormal; Notable for the following components:   BUN 25 (*)  Creatinine, Ser 1.40 (*)    Glucose, Bld 174 (*)    Calcium, Ion 1.02 (*)    All other components within normal limits  TROPONIN I (HIGH SENSITIVITY) - Abnormal; Notable for the following components:   Troponin I (High Sensitivity) 18 (*)    All other components within normal limits  SARS CORONAVIRUS 2 (TAT 6-24 HRS)  CBC WITH DIFFERENTIAL/PLATELET  PROTIME-INR  ETHANOL  BASIC METABOLIC PANEL  CBC  TROPONIN I (HIGH SENSITIVITY)                                                                                                                         EKG  EKG Interpretation  Date/Time:  Sunday October 25 2020 23:31:00 EDT Ventricular Rate:  54 PR Interval:  216 QRS Duration: 108 QT  Interval:  503 QTC Calculation: 477 R Axis:   -42 Text Interpretation: Sinus rhythm Borderline prolonged PR interval Probable left atrial enlargement LVH with secondary repolarization abnormality Anterior infarct, old new TWI in inferior leads when compared to prior tracings. Confirmed by Drema Pryardama, Waldo Damian [phone]([id number]) on 10/26/2020 4:45:27 AM       Radiology DG Hand 2 View Right  Result Date: 10/26/2020 CLINICAL DATA:  60 year old male with hand pain and swelling. Altered mental status. EXAM: RIGHT HAND - 2 VIEW COMPARISON:  None. FINDINGS: Pulse oximeter artifact at the distal 2nd finger. Bone mineralization is within normal limits for age. There is no evidence of fracture or dislocation. There is no evidence of arthropathy or other focal bone abnormality. No discrete soft tissue abnormality. IMPRESSION: Negative. Electronically Signed   By: Odessa FlemingH  Hall M.D.   On: 10/26/2020 04:44   DG Chest Port 1 View  Result Date: 10/26/2020 CLINICAL DATA:  Syncope EXAM: PORTABLE CHEST 1 VIEW COMPARISON:  03/24/2020 FINDINGS: Lungs are clear.  No pleural effusion or pneumothorax. The heart is normal in size. IMPRESSION: No evidence of acute cardiopulmonary disease. Electronically Signed   By: Charline BillsSriyesh  Krishnan M.D.   On: 10/26/2020 00:46    Pertinent labs & imaging results that were available during my care of the patient were reviewed by me and considered in my medical decision making (see chart for details).  Medications Ordered in ED Medications  albuterol (PROVENTIL) (2.5 MG/3ML) 0.083% nebulizer solution 2.5 mg (has no administration in time range)  enoxaparin (LOVENOX) injection 40 mg (has no administration in time range)  0.9 %  sodium chloride infusion (has no administration in time range)  acetaminophen (TYLENOL) tablet 650 mg (has no administration in time range)    Or  acetaminophen (TYLENOL) suppository 650 mg (has no administration in time range)  fluticasone (FLOVENT HFA) 110 MCG/ACT inhaler 2 puff  (has no administration in time range)  0.9 %  sodium chloride infusion ( Intravenous New Bag/Given 10/26/20 0017)  sodium chloride 0.9 % bolus 250 mL (0 mLs Intravenous Stopped 10/26/20 0159)  Procedures .1-3 Lead EKG Interpretation  Date/Time: 10/26/2020 4:43 AM Performed by: Nira Conn, MD Authorized by: Nira Conn, MD     Interpretation: normal     ECG rate:  62   ECG rate assessment: normal     Rhythm: sinus rhythm     Ectopy: none     Conduction: abnormal     Abnormal conduction: 1st degree AV block   Ultrasound ED Echo  Date/Time: 10/26/2020 4:46 AM Performed by: Nira Conn, MD Authorized by: Nira Conn, MD   Procedure details:    Indications: hypotension and syncope     Views: parasternal long axis view, parasternal short axis view, apical 4 chamber view and IVC view     Images: archived     Limitations:  Acoustic shadowing Findings:    Pericardium: no pericardial effusion     LV Function: severly depressed (<30%)     RV Diameter: normal     IVC: collapsed   Impression:    Impression: probable low CVP and decreased contractility   .Critical Care  Date/Time: 10/26/2020 4:47 AM Performed by: Nira Conn, MD Authorized by: Nira Conn, MD   Critical care provider statement:    Critical care time (minutes):  45   Critical care was necessary to treat or prevent imminent or life-threatening deterioration of the following conditions:  Dehydration and cardiac failure   Critical care was time spent personally by me on the following activities:  Discussions with consultants, evaluation of patient's response to treatment, examination of patient, ordering and performing treatments and interventions, ordering and review of laboratory studies, ordering and review of radiographic studies,  pulse oximetry, re-evaluation of patient's condition, obtaining history from patient or surrogate and review of old charts   Care discussed with: admitting provider    (including critical care time)  Medical Decision Making / ED Course I have reviewed the nursing notes for this encounter and the patient's prior records (if available in EHR or on provided paperwork).   Allen Mayer was evaluated in Emergency Department on 10/26/2020 for the symptoms described in the history of present illness. He was evaluated in the context of the global COVID-19 pandemic, which necessitated consideration that the patient might be at risk for infection with the SARS-CoV-2 virus that causes COVID-19. Institutional protocols and algorithms that pertain to the evaluation of patients at risk for COVID-19 are in a state of rapid change based on information released by regulatory bodies including the CDC and federal and state organizations. These policies and algorithms were followed during the patient's care in the ED.  Patient presents after syncopal episode and noted to be hypotensive by EMS that responded to IV fluids.  They reported blood pressures of 130s after 600 cc of IV fluids.  Currently patient's blood pressures in the 90s and low 100s.  No overt chest pain.  EKG does show new T wave inversions in the inferior leads.  Initial troponin was negative.  Second troponin was slightly elevated with a delta of one-point from 17-18. BNP reassuring <400.  Chest x-ray without evidence suggestive of pneumonia, pneumothorax, pneumomediastinum.  No abnormal contour of the mediastinum to suggest dissection. No evidence of acute injuries.  Patient was given additional IV fluids which did improve his blood pressures to 110s and 120s.  After fluid bolus completed patient's blood pressures did drop back down to systolics in the 90s and low 100s.  Bedside ultrasound obtained given patient's history of heart failure, prior  pericardial effusion, and LV thrombus. POCUS with no obvious pericardial effusion ruling out tamponade.  Unable to visualize the LV thrombus.  Heart function appears to be similar to last echo in November 2021.  RV size has decreased when compared to the prior echo.  IVC is flat and collapsed.  Given the patient's improvement with IV fluids, patient's hypotension is likely related to hypovolemia from diuresing.  However given the new T wave changes on EKG, patient's troponins will need to be trended.  Patient will likely need a repeat echocardiogram to confirm the resolution of his pericardial effusion and assess for the LV thrombus.  Case discussed with hospitalist who will admit patient for continued work-up and management.      Final Clinical Impression(s) / ED Diagnoses Final diagnoses:  Right hand pain  Swelling of right hand      This chart was dictated using voice recognition software.  Despite best efforts to proofread,  errors can occur which can change the documentation meaning.    Nira Conn, MD 10/26/20 [phone]

## 2020-10-26 NOTE — Progress Notes (Signed)
  Echocardiogram 2D Echocardiogram has been performed.  Augustine Radar 10/26/2020, 11:59 AM

## 2020-10-26 NOTE — ED Notes (Signed)
Pt given food and water

## 2020-10-26 NOTE — Progress Notes (Signed)
Patient wanted to speak to the Charge RN about leaving AMA. Charge RN spoke to the patient to address concerns, patient relayed that "I feel better now and I want to go home. The dpctor told me that she wants me to stay until tomorrow but I want to go home now". RN educated that the patient does not have discharge orders than the patient would have to sign paperwork acknowledging that he is going against medical advice. RN also express that he would have to find his own transportation and he would have to walk on his own. Patient relays that he understands and that he still wants to go. Alvino Chapel, MD paged and notified. Primary RN notified.

## 2020-10-26 NOTE — ED Notes (Signed)
Pt provided a ED Snack bag 

## 2020-10-26 NOTE — Progress Notes (Addendum)
  PROGRESS NOTE  Patient admitted earlier this morning. See H&P.   Russel Morain is a 60 y.o. male with medical history significant for polysubstance abuse, COPD, renal insufficiency, nonischemic cardiomyopathy with EF less than 20%, LV thrombus, now presenting to the emergency department after a brief loss of consciousness.  States that after he smoked a cigarette, he became lightheaded, dizzy and had a syncopal episode.  EMS found patient to be hypotensive.  Improved with IV saline.  On examination, patient without any symptoms.  Feeling well overall.  No further diarrhea.  Eating a sandwich.  -Blood pressure improved with IV fluid -Patient with known cardiomyopathy with EF less than 20% -Continue to hold diuretic, monitor oral intake and blood pressure -Echocardiogram is pending -Creatinine has improved -Check orthostatic VS  -COVID test is pending   Status is: Observation  The patient remains OBS appropriate and will d/c before 2 midnights.  Dispo: The patient is from: Home              Anticipated d/c is to: Home              Patient currently is not medically stable to d/c.   Difficult to place patient No       Noralee Stain, DO [facility] 10/26/2020, 11:03 AM  Available via Epic secure chat 7am-7pm After these hours, please refer to coverage provider listed on [email or web address]

## 2020-10-26 NOTE — ED Notes (Signed)
Attempted to give report to floor 

## 2020-10-26 NOTE — H&P (Signed)
History and Physical    Melburn Leibfried XMI:[id number] DOB: 14-Sep-1960 DOA: 10/25/2020  PCP: Medicine, [facility]   Patient coming from: home   Chief Complaint: Lightheaded, brief LOC, low BP   HPI: Governor Raygoza is a 60 y.o. male with medical history significant for polysubstance abuse, COPD, renal insufficiency, nonischemic cardiomyopathy with EF less than 20%, LV thrombus, now presenting to the emergency department after a brief loss of consciousness.  Patient reports that he has been compliant with his medications, rarely uses alcohol or cocaine anymore but used to 2 or 3 days ago, had 2 days of diarrhea from which he seems to have recovered, and then became lightheaded overnight, appeared to lose consciousness, and EMS was called.  EMS reports finding the patient's blood pressure to be 60/40 and witnessing a brief loss of consciousness.  He was given 600 cc of saline with improvement in blood pressure and transported to the ED.  Patient denies any recent chest pain or palpitations, notes that his feet were swollen 1 to 2 weeks ago but that has resolved, and denies shortness of breath or orthopnea.  He complains of right hand pain and swelling for 2 or 3 days without any inciting event that he can recall.  ED Course: Upon arrival to the ED, patient is found to be afebrile, saturating well on room air, bradycardic in the 50s, and with blood pressure 80/54.  EKG features sinus rhythm with rate 54, first-degree AV nodal block, and LVH with repolarization abnormality.  Chemistry panel notable for creatinine 1.49.  CBC unremarkable.  High-sensitivity troponin 17 and then 18.  BNP 347.  UDS positive for cocaine.  D-dimer 0.70.  General 50 cc of saline was administered in the emergency department.   Review of Systems:  All other systems reviewed and apart from HPI, are negative.  Past Medical History:  Diagnosis Date   Acid reflux    Alcohol abuse    Arthritis    Asthma     Back pain    Bronchitis    CHF (congestive heart failure) (HCC)    CKD (chronic kidney disease), stage III (HCC)    Cocaine abuse (HCC)    COPD (chronic obstructive pulmonary disease) (HCC)    History of noncompliance with medical treatment, presenting hazards to health    Homelessness    Hypertension    LV (left ventricular) mural thrombus    Myocardial infarction (HCC)    Neuropathic pain    NICM (nonischemic cardiomyopathy) (HCC) 2019   NSVT (nonsustained ventricular tachycardia) (HCC)    Pericardial effusion    Pulmonary hypertension (HCC)    RVF (right ventricular failure) (HCC)    Schizo affective schizophrenia (HCC)     Past Surgical History:  Procedure Laterality Date   None     RIGHT/LEFT HEART CATH AND CORONARY ANGIOGRAPHY N/A 12/13/2017   Procedure: RIGHT/LEFT HEART CATH AND CORONARY ANGIOGRAPHY;  Surgeon: Runell Gess, MD;  Location: [facility];  Service: Cardiovascular;  Laterality: N/A;    Social History:   reports that he has been smoking cigarettes. He has been smoking an average of .5 packs per day. He has never used smokeless tobacco. He reports current alcohol use. He reports previous drug use. Drugs: Cocaine and Marijuana.  No Known Allergies  Family History  Problem Relation Age of Onset   Heart attack Mother        Died age 56   Heart attack Brother  40     Prior to Admission medications   Medication Sig Start Date End Date Taking? Authorizing Provider  albuterol (VENTOLIN HFA) 108 (90 Base) MCG/ACT inhaler Inhale 2 puffs into the lungs every 4 (four) hours as needed for wheezing or shortness of breath. 12/04/19   Rollene Rotunda, MD  ARIPiprazole (ABILIFY) 10 MG tablet Take 10 mg by mouth 2 (two) times daily. 01/08/20   [provider]  benztropine (COGENTIN) 1 MG tablet Take 1 tablet (1 mg total) by mouth at bedtime. 05/22/20   Jodelle Gross, NP  carvedilol (COREG) 6.25 MG tablet Take 1 tablet (6.25 mg total) by mouth  2 (two) times daily. 09/18/20   Jodelle Gross, NP  citalopram (CELEXA) 20 MG tablet Take 1 tablet (20 mg total) by mouth daily. 05/22/20   Jodelle Gross, NP  FLOVENT HFA 110 MCG/ACT inhaler Inhale 2 puffs into the lungs 2 (two) times daily. 12/04/19   Rollene Rotunda, MD  gabapentin (NEURONTIN) 400 MG capsule Take 1 capsule (400 mg total) by mouth daily as needed (nerve pain). 05/22/20   Jodelle Gross, NP  hydrALAZINE (APRESOLINE) 25 MG tablet Take 1 tablet (25 mg total) by mouth every 8 (eight) hours. 05/22/20 07/21/20  Jodelle Gross, NP  HYDROcodone-acetaminophen (NORCO/VICODIN) 5-325 MG tablet Take 1 tablet by mouth every 4 (four) hours as needed for moderate pain. 10/14/20   Mancel Bale, MD  isosorbide mononitrate (IMDUR) 30 MG 24 hr tablet Take 0.5 tablets (15 mg total) by mouth daily. 05/22/20 07/21/20  Jodelle Gross, NP  nitroGLYCERIN (NITROSTAT) 0.4 MG SL tablet Place 1 tablet (0.4 mg total) under the tongue every 5 (five) minutes as needed for chest pain. 12/04/19   Rollene Rotunda, MD  oxyCODONE (OXY IR/ROXICODONE) 5 MG immediate release tablet Take 1 tablet (5 mg total) by mouth 3 (three) times daily as needed (pain). 11/02/19   Amin, Loura Halt, MD  torsemide (DEMADEX) 20 MG tablet Take 2 tablets (40 mg total) by mouth daily. 05/22/20 07/21/20  Jodelle Gross, NP  Vitamin D, Ergocalciferol, (DRISDOL) 1.25 MG (50000 UNIT) CAPS capsule Take 50,000 Units by mouth once a week. 01/24/20   [provider]    Physical Exam: Vitals:   10/26/20 0300 10/26/20 0330 10/26/20 0349 10/26/20 0400  BP: 102/68 122/85  129/67  Pulse: 61 (!) 54 (!) 42 (!) 50  Resp: 14 20  16   Temp:      TempSrc:      SpO2: 99% 98%  98%  Weight:      Height:        Constitutional: NAD, calm  Eyes: PERTLA, lids and conjunctivae normal ENMT: Mucous membranes are moist. Posterior pharynx clear of any exudate or lesions.   Neck: supple, no masses  Respiratory: no wheezing, no  crackles. No accessory muscle use.  Cardiovascular: S1 & S2 heard, regular rate and rhythm. No lower extremity edema.  Abdomen: No distension, no tenderness, soft. Bowel sounds active.  Musculoskeletal: no clubbing / cyanosis. Swelling and tenderness over dorsal aspect right MCPs, mainly 3 & 4; PROM intact.   Skin: no significant rashes, lesions, ulcers. Warm, dry, well-perfused. Neurologic: CN 2-12 grossly intact. Sensation intact. Moving all extremities.  Psychiatric: Alert and oriented to person, place, and situation. Calm and cooperative.    Labs and Imaging on Admission: I have personally reviewed following labs and imaging studies  CBC: Recent Labs  Lab 10/24/20 2330 10/26/20 0046  WBC 8.3  --  NEUTROABS 5.6  --   HGB 15.5 16.7  HCT 47.4 49.0  MCV 91.3  --   PLT 238  --    Basic Metabolic Panel: Recent Labs  Lab 10/24/20 2330 10/26/20 0046  NA 134* 135  K 4.4 4.2  CL 102 102  CO2 23  --   GLUCOSE 175* 174*  BUN 19 25*  CREATININE 1.49* 1.40*  CALCIUM 8.9  --    GFR: Estimated Creatinine Clearance: 60.8 mL/min (A) (by C-G formula based on SCr of 1.4 mg/dL (H)). Liver Function Tests: Recent Labs  Lab 10/24/20 2330  AST 26  ALT 20  ALKPHOS 91  BILITOT 1.3*  PROT 7.3  ALBUMIN 3.5   No results for input(s): LIPASE, AMYLASE in the last 168 hours. No results for input(s): AMMONIA in the last 168 hours. Coagulation Profile: Recent Labs  Lab 10/24/20 2330  INR 1.0   Cardiac Enzymes: No results for input(s): CKTOTAL, CKMB, CKMBINDEX, TROPONINI in the last 168 hours. BNP (last 3 results) No results for input(s): PROBNP in the last 8760 hours. HbA1C: No results for input(s): HGBA1C in the last 72 hours. CBG: Recent Labs  Lab 10/26/20 0019  GLUCAP 101*   Lipid Profile: No results for input(s): CHOL, HDL, LDLCALC, TRIG, CHOLHDL, LDLDIRECT in the last 72 hours. Thyroid Function Tests: No results for input(s): TSH, T4TOTAL, FREET4, T3FREE, THYROIDAB in  the last 72 hours. Anemia Panel: No results for input(s): VITAMINB12, FOLATE, FERRITIN, TIBC, IRON, RETICCTPCT in the last 72 hours. Urine analysis:    Component Value Date/Time   COLORURINE STRAW (A) 02/28/2020 0912   APPEARANCEUR CLEAR 02/28/2020 0912   LABSPEC 1.004 (L) 02/28/2020 0912   PHURINE 7.0 02/28/2020 0912   GLUCOSEU NEGATIVE 02/28/2020 0912   HGBUR SMALL (A) 02/28/2020 0912   BILIRUBINUR NEGATIVE 02/28/2020 0912   KETONESUR NEGATIVE 02/28/2020 0912   PROTEINUR NEGATIVE 02/28/2020 0912   UROBILINOGEN 1.0 01/31/2012 0445   NITRITE NEGATIVE 02/28/2020 0912   LEUKOCYTESUR NEGATIVE 02/28/2020 0912   Sepsis Labs: @LABRCNTIP (procalcitonin:4,lacticidven:4) )No results found for this or any previous visit (from the past 240 hour(s)).   Radiological Exams on Admission: DG Chest Port 1 View  Result Date: 10/26/2020 CLINICAL DATA:  Syncope EXAM: PORTABLE CHEST 1 VIEW COMPARISON:  03/24/2020 FINDINGS: Lungs are clear.  No pleural effusion or pneumothorax. The heart is normal in size. IMPRESSION: No evidence of acute cardiopulmonary disease. Electronically Signed   By: 03/26/2020 M.D.   On: 10/26/2020 00:46    EKG: Independently reviewed. Sinus rhythm, rate 54, 1st degree AV block, LVH with repolarization abnormality.   Assessment/Plan   1. Syncope  - Presents with lightheadedness and syncope witnessed by EMS  - BP was 60/40 with EMS, improved with IVF  - There was 12 mmHg drop in SBP on standing in ED  - He has known cardiomyopathy with EF <20% one year ago, sinus bradycardia and 1st deg AV block on EKG  - Continue cardiac monitoring, update echo    2. Hypotension due to hypovolemia  - EMS called out for lightheadedness and possible syncope, noted BP to be 60/40 with another brief LOC, and he was given 600 cc NS prior to arrival in ED  - BP as low 80/54 in ED, improved with another 250 cc saline  - Pt reports 2 days of diarrhea which seems to have resolved  - Hold  diuretics and antihypertensives initially and resume as his condition allows    3. Chronic combined systolic &  diastolic CHF; hx of LV thrombus  - In July 202, EF was <20% with likely LV thrombus at apex, global hypokinesis, grade 3 diastolic dysfunction, moderately reduced RV systolic function, mild-moderate LA enlargement, moderate RA enlargement, mild-mod MR, and moderate TR  - He had 2 days of diarrhea, was hypotensive, and appears hypovolemic  - He received IVF with EMS and in ED, plan to SLIV and hold diuretics for now, update echo, monitor volume status   4. AKI superimposed on CKD II   - SCr is 1.49 on admission, up from 1.06 on 10/14/20  - Likely acute prerenal azotemia in setting of recent diarrhea and hypotension  - He was given IVF with EMS and in ED  - Hold diuretics, renally-dose medications, repeat chem panel in am    5. COPD  - Appears stable  - Continue inhalers    6. Substance abuse  - UDS positive for cocaine  - Patient reports he only rarely uses EtOH and cocaine now, last use 2-3 days ago, does not seem interested in quitting entirely    7. Right hand pain and swelling  - Pt reports 2 days of atraumatic pain and swelling to right hand  - Swelling over dorsal aspect of MCPs noted  - No erythema, fever, or leukocytosis; he denies hx of gout  - Check radiographs    8. Elevated d-dimer  - D-dimer 0.70 in ED  - There is no chest pain, SOB, or evidence of DVT to suggest PE; he has hx of LV thrombus which could explain this mild elevation    DVT prophylaxis: Lovenox  Code Status: Full  Level of Care: Level of care: Telemetry Cardiac Family Communication: none present  Disposition Plan:  Patient is from: home  Anticipated d/c is to: Home  Anticipated d/c date is: 10/27/20 Patient currently: Pending cardiac monitoring, echocardiogram  Consults called: None  Admission status: Observation     Briscoe Deutscher, MD [facility]  10/26/2020, 4:06 AM

## 2020-10-26 NOTE — ED Notes (Signed)
Report given to Harlon Flor, RN of 3E10

## 2020-10-27 NOTE — Discharge Summary (Signed)
Patient signed out AMA on 7/18.  Noralee Stain, DO [facility] 10/27/2020, 7:24 AM

## 2020-11-23 DIAGNOSIS — F1910 Other psychoactive substance abuse, uncomplicated: Secondary | ICD-10-CM | POA: Insufficient documentation

## 2020-11-23 NOTE — Progress Notes (Deleted)
Cardiology Office Note   Date:  11/23/2020   ID:  Allen Mayer, DOB Nov 07, 1960, MRN [id number]  PCP:  [facility]icine, [facility]  Cardiologist:   Rollene Rotunda, MD Referring:  ***  No chief complaint on file.     History of Present Illness: Allen Mayer is a 60 y.o. male who presents for follow up of acute systolic HF and LV thrombus.  He has been a no show for many office and coumadin follow ups.  Of note he had prior cardiac catheterization in 2019 that demonstrated no coronary artery disease.  He has a nonischemic cardiomyopathy.  He did have runs of nonsustained ventricular tachycardia in the hospital.  Of note he has a history of cocaine use with previous positive tox screen recently.  We are cautiously using carvedilol rather than metoprolol.  Since I last saw him ***  ***  Overall I think he is doing relatively well.  He is not short of breath as he was when he came to the hospital although he still does have some dyspnea with exertion.  He says he has had some mild nosebleeds.  He has not noticed any GI bleeding.  I think he has been compliant with his cardiac medications.  He has a cough productive of some white phlegm occasionally but has not had any fevers or chills.  He has had no new PND or orthopnea.  He said no new lower extremity swelling.  Past Medical History:  Diagnosis Date   Acid reflux    Alcohol abuse    Arthritis    Asthma    Back pain    Bronchitis    CHF (congestive heart failure) (HCC)    CKD (chronic kidney disease), stage III (HCC)    Cocaine abuse (HCC)    COPD (chronic obstructive pulmonary disease) (HCC)    History of noncompliance with medical treatment, presenting hazards to health    Homelessness    Hypertension    LV (left ventricular) mural thrombus    Myocardial infarction (HCC)    Neuropathic pain    NICM (nonischemic cardiomyopathy) (HCC) 2019   NSVT (nonsustained ventricular tachycardia) (HCC)    Pericardial  effusion    Pulmonary hypertension (HCC)    RVF (right ventricular failure) (HCC)    Schizo affective schizophrenia (HCC)     Past Surgical History:  Procedure Laterality Date   None     RIGHT/LEFT HEART CATH AND CORONARY ANGIOGRAPHY N/A 12/13/2017   Procedure: RIGHT/LEFT HEART CATH AND CORONARY ANGIOGRAPHY;  Surgeon: Runell Gess, MD;  Location: [facility];  Service: Cardiovascular;  Laterality: N/A;     Current Outpatient Medications  Medication Sig Dispense Refill   albuterol (VENTOLIN HFA) 108 (90 Base) MCG/ACT inhaler Inhale 2 puffs into the lungs every 4 (four) hours as needed for wheezing or shortness of breath. 18 g 0   ARIPiprazole (ABILIFY) 10 MG tablet Take 10 mg by mouth 2 (two) times daily.     benztropine (COGENTIN) 1 MG tablet Take 1 tablet (1 mg total) by mouth at bedtime. 30 tablet 1   carvedilol (COREG) 6.25 MG tablet Take 1 tablet (6.25 mg total) by mouth 2 (two) times daily. 180 tablet 3   citalopram (CELEXA) 20 MG tablet Take 1 tablet (20 mg total) by mouth daily. 30 tablet 1   FLOVENT HFA 110 MCG/ACT inhaler Inhale 2 puffs into the lungs 2 (two) times daily. 1 each 1   gabapentin (NEURONTIN) 400 MG  capsule Take 1 capsule (400 mg total) by mouth daily as needed (nerve pain). 30 capsule 1   isosorbide mononitrate (IMDUR) 30 MG 24 hr tablet Take 0.5 tablets (15 mg total) by mouth daily. 90 tablet 3   nitroGLYCERIN (NITROSTAT) 0.4 MG SL tablet Place 1 tablet (0.4 mg total) under the tongue every 5 (five) minutes as needed for chest pain. 30 tablet 3   oxyCODONE (OXY IR/ROXICODONE) 5 MG immediate release tablet Take 1 tablet (5 mg total) by mouth 3 (three) times daily as needed (pain). 4 tablet 0   PRESCRIPTION MEDICATION Take 1 tablet by mouth daily. Lisinopril     torsemide (DEMADEX) 20 MG tablet Take 2 tablets (40 mg total) by mouth daily. 180 tablet 3   traZODone (DESYREL) 50 MG tablet Take 50 mg by mouth at bedtime as needed for sleep.     Vitamin D,  Ergocalciferol, (DRISDOL) 1.25 MG (50000 UNIT) CAPS capsule Take 50,000 Units by mouth once a week.     No current facility-administered medications for this visit.    Allergies:   Patient has no known allergies.    ROS:  Please see the history of present illness.   Otherwise, review of systems are positive for {NONE DEFAULTED:18576}.   All other systems are reviewed and negative.    PHYSICAL EXAM: VS:  There were no vitals taken for this visit. , BMI There is no height or weight on file to calculate BMI. GENERAL:  Well appearing NECK:  No jugular venous distention, waveform within normal limits, carotid upstroke brisk and symmetric, no bruits, no thyromegaly LUNGS:  Clear to auscultation bilaterally CHEST:  Unremarkable HEART:  PMI not displaced or sustained,S1 and S2 within normal limits, no S3, no S4, no clicks, no rubs, *** murmurs ABD:  Flat, positive bowel sounds normal in frequency in pitch, no bruits, no rebound, no guarding, no midline pulsatile mass, no hepatomegaly, no splenomegaly EXT:  2 plus pulses throughout, no edema, no cyanosis no clubbing     ***GENERAL:  Well appearing HEENT:  Pupils equal round and reactive, fundi not visualized, oral mucosa unremarkable NECK:  No jugular venous distention, waveform within normal limits, carotid upstroke brisk and symmetric, no bruits, no thyromegaly LYMPHATICS:  No cervical, inguinal adenopathy LUNGS:  Clear to auscultation bilaterally BACK:  No CVA tenderness CHEST:  Unremarkable HEART:  PMI not displaced or sustained,S1 and S2 within normal limits, no S3, no S4, no clicks, no rubs, *** murmurs ABD:  Flat, positive bowel sounds normal in frequency in pitch, no bruits, no rebound, no guarding, no midline pulsatile mass, no hepatomegaly, no splenomegaly EXT:  2 plus pulses throughout, no edema, no cyanosis no clubbing SKIN:  No rashes no nodules NEURO:  Cranial nerves II through XII grossly intact, motor grossly intact  throughout PSYCH:  Cognitively intact, oriented to person place and time    EKG:  EKG {ACTION; IS/IS [id number]} ordered today. The ekg ordered today demonstrates ***   Recent Labs: 03/03/2020: Magnesium 1.7 10/24/2020: ALT 20 10/25/2020: B Natriuretic Peptide 347.2 10/26/2020: BUN 19; Creatinine, Ser 1.18; Hemoglobin 14.6; Platelets 234; Potassium 4.3; Sodium 135    Lipid Panel    Component Value Date/Time   CHOL 126 10/28/2019 0712   TRIG 57 10/28/2019 0712   HDL 32 (L) 10/28/2019 0712   CHOLHDL 3.9 10/28/2019 0712   VLDL 11 10/28/2019 0712   LDLCALC 83 10/28/2019 0712      Wt Readings from Last 3 Encounters:  10/25/20 200 lb (  90.7 kg)  10/14/20 205 lb (93 kg)  05/22/20 189 lb (85.7 kg)      Other studies Reviewed: Additional studies/ records that were reviewed today include: ***. Review of the above records demonstrates:  Please see elsewhere in the note.  ***   ASSESSMENT AND PLAN:  Systolic CHF in the setting of hypertensive cardiomyopathy:  ***  He is currently without complaints but is in need of his medication refills. He does not appear to be fluid overloaded. He states that he is medically compliant with his medicine. I will refills his medications and have counseled him on compliance to avoid repeat hospitalizations with worsening CHF and poorer outcomes. He verbalizes understanding at this thine.     Hypertension:  ***  Not optimal for severely reduced EF. Will need careful titration of medications to slowly get him to goal. Due to non-compliance issues, I will not change anything now, as he will need to fill his Rx first and then follow up to see how he is responding. Would go up on hydralazine and coreg on next office visit in 3 months,   Polysubstance abuse:  ***  Significant amount of time discussing the poor outcomes of those with his heart condition who continue to use cocaine and drink heavily. With his kidney disease, he was also concerned that he  would end up o dialysis. He wants to avoid this at all costs. He will need close follow up with PCP.  Possibly [facility]. He has been there before and will check on going back. .   Current medicines are reviewed at length with the patient today.  The patient {ACTIONS; HAS/DOES NOT HAVE:19233} concerns regarding medicines.  The following changes have been made:  {PLAN; NO CHANGE:13088:s}  Labs/ tests ordered today include: *** No orders of the defined types were placed in this encounter.    Disposition:   FU with ***    Signed, Rollene Rotunda, MD  11/23/2020 7:28 PM    [facility]

## 2020-11-24 ENCOUNTER — Ambulatory Visit: Payer: Medicaid Other | Admitting: Cardiology

## 2020-11-24 DIAGNOSIS — I10 Essential (primary) hypertension: Secondary | ICD-10-CM

## 2020-11-24 DIAGNOSIS — I5042 Chronic combined systolic (congestive) and diastolic (congestive) heart failure: Secondary | ICD-10-CM

## 2020-11-24 DIAGNOSIS — F1910 Other psychoactive substance abuse, uncomplicated: Secondary | ICD-10-CM

## 2020-12-14 ENCOUNTER — Emergency Department (HOSPITAL_COMMUNITY): Payer: Medicaid Other

## 2020-12-14 ENCOUNTER — Encounter (HOSPITAL_COMMUNITY): Payer: Self-pay | Admitting: Surgery

## 2020-12-14 ENCOUNTER — Emergency Department (HOSPITAL_COMMUNITY): Payer: Medicaid Other | Admitting: Anesthesiology

## 2020-12-14 ENCOUNTER — Encounter (HOSPITAL_COMMUNITY): Admission: EM | Disposition: A | Discharge status: Home / Self Care | Payer: Self-pay | Source: Home / Self Care

## 2020-12-14 ENCOUNTER — Inpatient Hospital Stay (HOSPITAL_COMMUNITY)
Admission: EM | Admit: 2020-12-14 | Discharge: 2020-12-18 | DRG: 356 | Disposition: A | Discharge status: Home / Self Care | Payer: Medicaid Other | Attending: General Surgery | Admitting: General Surgery

## 2020-12-14 DIAGNOSIS — Z20822 Contact with and (suspected) exposure to covid-19: Secondary | ICD-10-CM | POA: Diagnosis present

## 2020-12-14 DIAGNOSIS — I428 Other cardiomyopathies: Secondary | ICD-10-CM | POA: Diagnosis present

## 2020-12-14 DIAGNOSIS — F17210 Nicotine dependence, cigarettes, uncomplicated: Secondary | ICD-10-CM | POA: Diagnosis present

## 2020-12-14 DIAGNOSIS — T1490XA Injury, unspecified, initial encounter: Secondary | ICD-10-CM

## 2020-12-14 DIAGNOSIS — F1490 Cocaine use, unspecified, uncomplicated: Secondary | ICD-10-CM | POA: Diagnosis present

## 2020-12-14 DIAGNOSIS — S31119A Laceration without foreign body of abdominal wall, unspecified quadrant without penetration into peritoneal cavity, initial encounter: Secondary | ICD-10-CM | POA: Diagnosis present

## 2020-12-14 DIAGNOSIS — R0902 Hypoxemia: Secondary | ICD-10-CM

## 2020-12-14 DIAGNOSIS — F259 Schizoaffective disorder, unspecified: Secondary | ICD-10-CM | POA: Diagnosis present

## 2020-12-14 DIAGNOSIS — E871 Hypo-osmolality and hyponatremia: Secondary | ICD-10-CM | POA: Diagnosis present

## 2020-12-14 DIAGNOSIS — J9601 Acute respiratory failure with hypoxia: Secondary | ICD-10-CM | POA: Diagnosis not present

## 2020-12-14 DIAGNOSIS — S36898A Other injury of other intra-abdominal organs, initial encounter: Secondary | ICD-10-CM | POA: Diagnosis present

## 2020-12-14 DIAGNOSIS — Z23 Encounter for immunization: Secondary | ICD-10-CM

## 2020-12-14 DIAGNOSIS — Z87448 Personal history of other diseases of urinary system: Secondary | ICD-10-CM

## 2020-12-14 DIAGNOSIS — S51011A Laceration without foreign body of right elbow, initial encounter: Secondary | ICD-10-CM | POA: Diagnosis present

## 2020-12-14 DIAGNOSIS — I110 Hypertensive heart disease with heart failure: Secondary | ICD-10-CM | POA: Diagnosis present

## 2020-12-14 DIAGNOSIS — Z9889 Other specified postprocedural states: Secondary | ICD-10-CM

## 2020-12-14 DIAGNOSIS — Z4659 Encounter for fitting and adjustment of other gastrointestinal appliance and device: Secondary | ICD-10-CM

## 2020-12-14 DIAGNOSIS — I509 Heart failure, unspecified: Secondary | ICD-10-CM | POA: Diagnosis present

## 2020-12-14 DIAGNOSIS — S41111A Laceration without foreign body of right upper arm, initial encounter: Secondary | ICD-10-CM | POA: Diagnosis present

## 2020-12-14 DIAGNOSIS — S41112A Laceration without foreign body of left upper arm, initial encounter: Secondary | ICD-10-CM | POA: Diagnosis present

## 2020-12-14 DIAGNOSIS — S31610A Laceration without foreign body of abdominal wall, right upper quadrant with penetration into peritoneal cavity, initial encounter: Secondary | ICD-10-CM | POA: Diagnosis present

## 2020-12-14 DIAGNOSIS — S46922A Laceration of unspecified muscle, fascia and tendon at shoulder and upper arm level, left arm, initial encounter: Secondary | ICD-10-CM | POA: Diagnosis present

## 2020-12-14 DIAGNOSIS — J449 Chronic obstructive pulmonary disease, unspecified: Secondary | ICD-10-CM | POA: Diagnosis present

## 2020-12-14 HISTORY — DX: Essential (primary) hypertension: I10

## 2020-12-14 HISTORY — PX: I & D EXTREMITY: SHX5045

## 2020-12-14 HISTORY — DX: Chronic kidney disease, unspecified: N18.9

## 2020-12-14 HISTORY — PX: LAPAROTOMY: SHX154

## 2020-12-14 HISTORY — DX: Chronic obstructive pulmonary disease, unspecified: J44.9

## 2020-12-14 HISTORY — DX: Heart failure, unspecified: I50.9

## 2020-12-14 LAB — I-STAT CHEM 8, ED
BUN: 17 mg/dL (ref 8–23)
Calcium, Ion: 1.16 mmol/L (ref 1.15–1.40)
Chloride: 103 mmol/L (ref 98–111)
Creatinine, Ser: 1.1 mg/dL (ref 0.61–1.24)
Glucose, Bld: 91 mg/dL (ref 70–99)
HCT: 43 % (ref 39.0–52.0)
Hemoglobin: 14.6 g/dL (ref 13.0–17.0)
Potassium: 4 mmol/L (ref 3.5–5.1)
Sodium: 139 mmol/L (ref 135–145)
TCO2: 25 mmol/L (ref 22–32)

## 2020-12-14 LAB — POCT I-STAT 7, (LYTES, BLD GAS, ICA,H+H)
Acid-Base Excess: 0 mmol/L (ref 0.0–2.0)
Acid-base deficit: 1 mmol/L (ref 0.0–2.0)
Acid-base deficit: 2 mmol/L (ref 0.0–2.0)
Bicarbonate: 24.2 mmol/L (ref 20.0–28.0)
Bicarbonate: 25.9 mmol/L (ref 20.0–28.0)
Bicarbonate: 24.4 mmol/L (ref 20.0–28.0)
Calcium, Ion: 1.21 mmol/L (ref 1.15–1.40)
Calcium, Ion: 1.5 mmol/L — ABNORMAL HIGH (ref 1.15–1.40)
Calcium, Ion: 1.28 mmol/L (ref 1.15–1.40)
HCT: 37 % — ABNORMAL LOW (ref 39.0–52.0)
HCT: 39 % (ref 39.0–52.0)
HCT: 35 % — ABNORMAL LOW (ref 39.0–52.0)
Hemoglobin: 12.6 g/dL — ABNORMAL LOW (ref 13.0–17.0)
Hemoglobin: 13.3 g/dL (ref 13.0–17.0)
Hemoglobin: 11.9 g/dL — ABNORMAL LOW (ref 13.0–17.0)
O2 Saturation: 100 %
O2 Saturation: 91 %
O2 Saturation: 99 %
Patient temperature: 35
Patient temperature: 35.8
Patient temperature: 35
Potassium: 3.8 mmol/L (ref 3.5–5.1)
Potassium: 4 mmol/L (ref 3.5–5.1)
Potassium: 3.7 mmol/L (ref 3.5–5.1)
Sodium: 139 mmol/L (ref 135–145)
Sodium: 139 mmol/L (ref 135–145)
Sodium: 139 mmol/L (ref 135–145)
TCO2: 25 mmol/L (ref 22–32)
TCO2: 27 mmol/L (ref 22–32)
TCO2: 26 mmol/L (ref 22–32)
pCO2 arterial: 38.7 mmHg (ref 32.0–48.0)
pCO2 arterial: 42.8 mmHg (ref 32.0–48.0)
pCO2 arterial: 41.7 mmHg (ref 32.0–48.0)
pH, Arterial: 7.385 (ref 7.350–7.450)
pH, Arterial: 7.395 (ref 7.350–7.450)
pH, Arterial: 7.367 (ref 7.350–7.450)
pO2, Arterial: 215 mmHg — ABNORMAL HIGH (ref 83.0–108.0)
pO2, Arterial: 54 mmHg — ABNORMAL LOW (ref 83.0–108.0)
pO2, Arterial: 129 mmHg — ABNORMAL HIGH (ref 83.0–108.0)

## 2020-12-14 LAB — COMPREHENSIVE METABOLIC PANEL
ALT: 21 U/L (ref 0–44)
AST: 30 U/L (ref 15–41)
Albumin: 3.6 g/dL (ref 3.5–5.0)
Alkaline Phosphatase: 64 U/L (ref 38–126)
Anion gap: 9 (ref 5–15)
BUN: 16 mg/dL (ref 8–23)
CO2: 24 mmol/L (ref 22–32)
Calcium: 9.1 mg/dL (ref 8.9–10.3)
Chloride: 102 mmol/L (ref 98–111)
Creatinine, Ser: 1.27 mg/dL — ABNORMAL HIGH (ref 0.61–1.24)
GFR, Estimated: >60 mL/min (ref >60)
Glucose, Bld: 92 mg/dL (ref 70–99)
Potassium: 4 mmol/L (ref 3.5–5.1)
Sodium: 135 mmol/L (ref 135–145)
Total Bilirubin: 0.9 mg/dL (ref 0.3–1.2)
Total Protein: 6.7 g/dL (ref 6.5–8.1)

## 2020-12-14 LAB — CBC
HCT: 42.7 % (ref 39.0–52.0)
Hemoglobin: 14.1 g/dL (ref 13.0–17.0)
MCH: 30.2 pg (ref 26.0–34.0)
MCHC: 33 g/dL (ref 30.0–36.0)
MCV: 91.4 fL (ref 80.0–100.0)
Platelets: 180 10*3/uL (ref 150–400)
RBC: 4.67 MIL/uL (ref 4.22–5.81)
RDW: 14.6 % (ref 11.5–15.5)
WBC: 6.5 10*3/uL (ref 4.0–10.5)
nRBC: 0 % (ref 0.0–0.2)

## 2020-12-14 LAB — RESP PANEL BY RT-PCR (FLU A&B, COVID) ARPGX2
Influenza A by PCR: NEGATIVE
Influenza B by PCR: NEGATIVE
SARS Coronavirus 2 by RT PCR: NEGATIVE

## 2020-12-14 LAB — PROTIME-INR
INR: 1 (ref 0.8–1.2)
Prothrombin Time: 13.4 seconds (ref 11.4–15.2)

## 2020-12-14 LAB — SAMPLE TO BLOOD BANK

## 2020-12-14 LAB — LACTIC ACID, PLASMA: Lactic Acid, Venous: 1.7 mmol/L (ref 0.5–1.9)

## 2020-12-14 LAB — ETHANOL: Alcohol, Ethyl (B): <10 mg/dL (ref <10)

## 2020-12-14 SURGERY — LAPAROTOMY, EXPLORATORY
Anesthesia: General

## 2020-12-14 MED ORDER — EPHEDRINE 5 MG/ML INJ
INTRAVENOUS | Status: AC
  Filled 2020-12-14: qty 10

## 2020-12-14 MED ORDER — PROPOFOL 1000 MG/100ML IV EMUL
INTRAVENOUS | Status: AC
  Filled 2020-12-14: qty 100

## 2020-12-14 MED ORDER — ROCURONIUM BROMIDE 10 MG/ML (PF) SYRINGE
PREFILLED_SYRINGE | INTRAVENOUS | Status: AC
  Filled 2020-12-14: qty 10

## 2020-12-14 MED ORDER — ARTIFICIAL TEARS OPHTHALMIC OINT
TOPICAL_OINTMENT | OPHTHALMIC | Status: AC
  Filled 2020-12-14: qty 3.5

## 2020-12-14 MED ORDER — VASOPRESSIN 20 UNIT/ML IV SOLN
INTRAVENOUS | Status: AC
  Filled 2020-12-14: qty 1

## 2020-12-14 MED ORDER — PHENYLEPHRINE HCL (PRESSORS) 10 MG/ML IV SOLN
INTRAVENOUS | Status: DC | PRN
  Administered 2020-12-14: 80 ug via INTRAVENOUS

## 2020-12-14 MED ORDER — FENTANYL CITRATE PF 50 MCG/ML IJ SOSY
50.0000 ug | PREFILLED_SYRINGE | INTRAMUSCULAR | Status: DC | PRN
  Administered 2020-12-15: 50 ug via INTRAVENOUS
  Filled 2020-12-14: qty 1

## 2020-12-14 MED ORDER — LIDOCAINE HCL (CARDIAC) PF 100 MG/5ML IV SOSY
PREFILLED_SYRINGE | INTRAVENOUS | Status: DC | PRN
  Administered 2020-12-14: 40 mg via INTRATRACHEAL

## 2020-12-14 MED ORDER — CALCIUM CHLORIDE 10 % IV SOLN
INTRAVENOUS | Status: AC
  Filled 2020-12-14: qty 10

## 2020-12-14 MED ORDER — VASOPRESSIN 20 UNIT/ML IV SOLN
INTRAVENOUS | Status: DC | PRN
  Administered 2020-12-14 (×2): 1 [IU] via INTRAVENOUS

## 2020-12-14 MED ORDER — INSULIN ASPART 100 UNIT/ML IJ SOLN: 0.0000 [IU] | INTRAMUSCULAR | Status: DC

## 2020-12-14 MED ORDER — PROPOFOL 10 MG/ML IV BOLUS
INTRAVENOUS | Status: AC
  Filled 2020-12-14: qty 40

## 2020-12-14 MED ORDER — SUCCINYLCHOLINE CHLORIDE 200 MG/10ML IV SOSY
PREFILLED_SYRINGE | INTRAVENOUS | Status: DC | PRN
  Administered 2020-12-14: 120 mg via INTRAVENOUS

## 2020-12-14 MED ORDER — ONDANSETRON HCL 4 MG/2ML IJ SOLN: 4.0000 mg | Freq: Four times a day (QID) | INTRAMUSCULAR | Status: DC | PRN

## 2020-12-14 MED ORDER — LACTATED RINGERS IV SOLN: INTRAVENOUS | Status: DC

## 2020-12-14 MED ORDER — MIDAZOLAM HCL 2 MG/2ML IJ SOLN
INTRAMUSCULAR | Status: AC
  Filled 2020-12-14: qty 2

## 2020-12-14 MED ORDER — PROPOFOL 10 MG/ML IV BOLUS
INTRAVENOUS | Status: DC | PRN
  Administered 2020-12-14: 50 mg via INTRAVENOUS

## 2020-12-14 MED ORDER — PHENYLEPHRINE 40 MCG/ML (10ML) SYRINGE FOR IV PUSH (FOR BLOOD PRESSURE SUPPORT)
PREFILLED_SYRINGE | INTRAVENOUS | Status: AC
  Filled 2020-12-14: qty 20

## 2020-12-14 MED ORDER — FENTANYL CITRATE PF 50 MCG/ML IJ SOSY
100.0000 ug | PREFILLED_SYRINGE | Freq: Once | INTRAMUSCULAR | Status: AC
  Administered 2020-12-14: 100 ug via INTRAVENOUS

## 2020-12-14 MED ORDER — TETANUS-DIPHTH-ACELL PERTUSSIS 5-2.5-18.5 LF-MCG/0.5 IM SUSY
0.5000 mL | PREFILLED_SYRINGE | Freq: Once | INTRAMUSCULAR | Status: AC
  Administered 2020-12-14: 0.5 mL via INTRAMUSCULAR

## 2020-12-14 MED ORDER — FENTANYL CITRATE PF 50 MCG/ML IJ SOSY
50.0000 ug | PREFILLED_SYRINGE | INTRAMUSCULAR | Status: DC | PRN
  Administered 2020-12-15: 200 ug via INTRAVENOUS
  Administered 2020-12-15: 100 ug via INTRAVENOUS
  Filled 2020-12-14: qty 2
  Filled 2020-12-14: qty 4

## 2020-12-14 MED ORDER — FENTANYL CITRATE (PF) 250 MCG/5ML IJ SOLN
INTRAMUSCULAR | Status: DC | PRN
  Administered 2020-12-14: 50 ug via INTRAVENOUS
  Administered 2020-12-14: 150 ug via INTRAVENOUS
  Administered 2020-12-14: 50 ug via INTRAVENOUS

## 2020-12-14 MED ORDER — LIDOCAINE 2% (20 MG/ML) 5 ML SYRINGE
INTRAMUSCULAR | Status: AC
  Filled 2020-12-14: qty 5

## 2020-12-14 MED ORDER — SUCCINYLCHOLINE CHLORIDE 200 MG/10ML IV SOSY
PREFILLED_SYRINGE | INTRAVENOUS | Status: AC
  Filled 2020-12-14: qty 10

## 2020-12-14 MED ORDER — SODIUM CHLORIDE (PF) 0.9 % IJ SOLN
INTRAMUSCULAR | Status: AC
  Filled 2020-12-14: qty 10

## 2020-12-14 MED ORDER — ONDANSETRON 4 MG PO TBDP: 4.0000 mg | ORAL_TABLET | Freq: Four times a day (QID) | ORAL | Status: DC | PRN

## 2020-12-14 MED ORDER — LACTATED RINGERS IV SOLN: INTRAVENOUS | Status: DC | PRN

## 2020-12-14 MED ORDER — DOCUSATE SODIUM 50 MG/5ML PO LIQD: 100.0000 mg | Freq: Two times a day (BID) | ORAL | Status: DC

## 2020-12-14 MED ORDER — FENTANYL CITRATE (PF) 250 MCG/5ML IJ SOLN
INTRAMUSCULAR | Status: AC
  Filled 2020-12-14: qty 5

## 2020-12-14 MED ORDER — FENTANYL CITRATE (PF) 100 MCG/2ML IJ SOLN
INTRAMUSCULAR | Status: AC
  Filled 2020-12-14: qty 2

## 2020-12-14 MED ORDER — CALCIUM CHLORIDE 10 % IV SOLN
INTRAVENOUS | Status: DC | PRN
  Administered 2020-12-14: 100 mg via INTRAVENOUS
  Administered 2020-12-14: 150 mg via INTRAVENOUS
  Administered 2020-12-14: 250 mg via INTRAVENOUS

## 2020-12-14 MED ORDER — PROPOFOL 1000 MG/100ML IV EMUL
0.0000 ug/kg/min | INTRAVENOUS | Status: DC
  Administered 2020-12-15: 30 ug/kg/min via INTRAVENOUS
  Administered 2020-12-15: 20 ug/kg/min via INTRAVENOUS
  Administered 2020-12-15: 30 ug/kg/min via INTRAVENOUS
  Administered 2020-12-15: 10 ug/kg/min via INTRAVENOUS
  Administered 2020-12-16: 25 ug/kg/min via INTRAVENOUS
  Filled 2020-12-14 (×4): qty 100

## 2020-12-14 MED ORDER — ONDANSETRON HCL 4 MG PO TABS: 4.0000 mg | ORAL_TABLET | Freq: Four times a day (QID) | ORAL | Status: DC | PRN

## 2020-12-14 MED ORDER — ROCURONIUM BROMIDE 100 MG/10ML IV SOLN
INTRAVENOUS | Status: DC | PRN
  Administered 2020-12-14: 50 mg via INTRAVENOUS
  Administered 2020-12-14: 30 mg via INTRAVENOUS
  Administered 2020-12-14: 20 mg via INTRAVENOUS

## 2020-12-14 MED ORDER — PIPERACILLIN-TAZOBACTAM 3.375 G IVPB 30 MIN
3.3750 g | Freq: Once | INTRAVENOUS | Status: AC
  Administered 2020-12-14: 3.375 g via INTRAVENOUS

## 2020-12-14 MED ORDER — ONDANSETRON HCL 4 MG/2ML IJ SOLN
INTRAMUSCULAR | Status: AC
  Filled 2020-12-14: qty 2

## 2020-12-14 MED ORDER — EPHEDRINE SULFATE 50 MG/ML IJ SOLN
INTRAMUSCULAR | Status: DC | PRN
  Administered 2020-12-14: 10 mg via INTRAVENOUS
  Administered 2020-12-14: 5 mg via INTRAVENOUS

## 2020-12-14 MED ORDER — ENOXAPARIN SODIUM 30 MG/0.3ML IJ SOSY
30.0000 mg | PREFILLED_SYRINGE | Freq: Two times a day (BID) | INTRAMUSCULAR | Status: DC
  Administered 2020-12-15 – 2020-12-18 (×7): 30 mg via SUBCUTANEOUS
  Filled 2020-12-14 (×7): qty 0.3

## 2020-12-14 MED ORDER — MIDAZOLAM HCL 5 MG/5ML IJ SOLN
INTRAMUSCULAR | Status: DC | PRN
  Administered 2020-12-14 (×2): 1 mg via INTRAVENOUS

## 2020-12-14 SURGICAL SUPPLY — 58 items
BAG COUNTER SPONGE SURGICOUNT (BAG) ×3 IMPLANT
BAG SPNG CNTER NS LX DISP (BAG) ×2
BLADE CLIPPER SURG (BLADE) ×1 IMPLANT
BNDG COHESIVE 4X5 TAN ST LF (GAUZE/BANDAGES/DRESSINGS) ×2 IMPLANT
BNDG ELASTIC 3X5.8 VLCR STR LF (GAUZE/BANDAGES/DRESSINGS) ×4 IMPLANT
BNDG ELASTIC 4X5.8 VLCR STR LF (GAUZE/BANDAGES/DRESSINGS) ×3 IMPLANT
CANISTER SUCT 3000ML PPV (MISCELLANEOUS) ×3 IMPLANT
COVER SURGICAL LIGHT HANDLE (MISCELLANEOUS) ×3 IMPLANT
DRAPE EXTREMITY T 121X128X90 (DISPOSABLE) ×2 IMPLANT
DRAPE IMP U-DRAPE 54X76 (DRAPES) ×2 IMPLANT
DRAPE LAPAROSCOPIC ABDOMINAL (DRAPES) ×3 IMPLANT
DRAPE WARM FLUID 44X44 (DRAPES) ×3 IMPLANT
DRSG OPSITE 4X5.5 SM (GAUZE/BANDAGES/DRESSINGS) ×1 IMPLANT
DRSG OPSITE POSTOP 4X10 (GAUZE/BANDAGES/DRESSINGS) IMPLANT
DRSG OPSITE POSTOP 4X8 (GAUZE/BANDAGES/DRESSINGS) ×1 IMPLANT
DRSG XEROFORM 1X8 (GAUZE/BANDAGES/DRESSINGS) ×1 IMPLANT
ELECT BLADE 6.5 EXT (BLADE) IMPLANT
ELECT CAUTERY BLADE 6.4 (BLADE) ×3 IMPLANT
ELECT REM PT RETURN 9FT ADLT (ELECTROSURGICAL) ×3
ELECTRODE REM PT RTRN 9FT ADLT (ELECTROSURGICAL) ×2 IMPLANT
GAUZE SPONGE 4X4 12PLY STRL (GAUZE/BANDAGES/DRESSINGS) ×5 IMPLANT
GAUZE SPONGE 4X4 12PLY STRL LF (GAUZE/BANDAGES/DRESSINGS) ×2 IMPLANT
GAUZE XEROFORM 1X8 LF (GAUZE/BANDAGES/DRESSINGS) ×3 IMPLANT
GLOVE SURG POLY MICRO LF SZ5.5 (GLOVE) ×3 IMPLANT
GLOVE SURG UNDER POLY LF SZ6 (GLOVE) ×3 IMPLANT
GOWN STRL REUS W/ TWL LRG LVL3 (GOWN DISPOSABLE) ×4 IMPLANT
GOWN STRL REUS W/ TWL XL LVL3 (GOWN DISPOSABLE) ×2 IMPLANT
GOWN STRL REUS W/TWL LRG LVL3 (GOWN DISPOSABLE) ×6
GOWN STRL REUS W/TWL XL LVL3 (GOWN DISPOSABLE) ×3
HANDLE SUCTION POOLE (INSTRUMENTS) ×2 IMPLANT
HEMOSTAT SURGICEL 2X4 FIBR (HEMOSTASIS) ×1 IMPLANT
KIT BASIN OR (CUSTOM PROCEDURE TRAY) ×3 IMPLANT
KIT TURNOVER KIT B (KITS) ×3 IMPLANT
NS IRRIG 1000ML POUR BTL (IV SOLUTION) ×6 IMPLANT
PACK GENERAL/GYN (CUSTOM PROCEDURE TRAY) ×3 IMPLANT
PAD ARMBOARD 7.5X6 YLW CONV (MISCELLANEOUS) ×6 IMPLANT
PENCIL SMOKE EVACUATOR (MISCELLANEOUS) ×3 IMPLANT
SOL PREP POV-IOD 4OZ 10% (MISCELLANEOUS) ×7 IMPLANT
STAPLER VISISTAT 35W (STAPLE) ×1 IMPLANT
STOCKINETTE 6  STRL (DRAPES) ×3
STOCKINETTE 6 STRL (DRAPES) IMPLANT
STOCKINETTE IMPERVIOUS 9X36 MD (GAUZE/BANDAGES/DRESSINGS) ×1 IMPLANT
SUCTION POOLE HANDLE (INSTRUMENTS) ×3
SUT ETHILON 3 0 FSL (SUTURE) ×4 IMPLANT
SUT PDS AB 1 CT1 36 (SUTURE) ×2 IMPLANT
SUT PDS AB 1 TP1 96 (SUTURE) ×6 IMPLANT
SUT SILK 2 0 SH CR/8 (SUTURE) ×3 IMPLANT
SUT SILK 2 0 TIES 10X30 (SUTURE) ×3 IMPLANT
SUT SILK 3 0 SH CR/8 (SUTURE) ×3 IMPLANT
SUT SILK 3 0 TIES 10X30 (SUTURE) ×3 IMPLANT
SUT VIC AB 3-0 SH 18 (SUTURE) ×2 IMPLANT
SUT VIC AB 3-0 SH 27 (SUTURE) ×6
SUT VIC AB 3-0 SH 27XBRD (SUTURE) ×4 IMPLANT
TOWEL GREEN STERILE (TOWEL DISPOSABLE) ×3 IMPLANT
TRAY FOLEY MTR SLVR 16FR STAT (SET/KITS/TRAYS/PACK) ×1 IMPLANT
TUBE FEEDING ENTERAL 5FR 16IN (TUBING) IMPLANT
UNDERPAD 30X36 HEAVY ABSORB (UNDERPADS AND DIAPERS) ×4 IMPLANT
YANKAUER SUCT BULB TIP NO VENT (SUCTIONS) ×3 IMPLANT

## 2020-12-14 NOTE — ED Notes (Signed)
Pts glasses taken to OR with him.
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
## 2020-12-14 NOTE — Anesthesia Procedure Notes (Signed)
Arterial Line Insertion Start/End9/08/2020 9:40 PM, 12/14/2020 9:50 PM Performed by: Claudina Lick, CRNA, CRNA  Patient location: Pre-op. Preanesthetic checklist: patient identified, IV checked, site marked, risks and benefits discussed, surgical consent, monitors and equipment checked, pre-op evaluation, timeout performed and anesthesia consent Emergency situation Left, radial was placed Catheter size: 20 G Hand hygiene performed  and maximum sterile barriers used   Attempts: 1 Procedure performed without using ultrasound guided technique. Ultrasound Notes:anatomy identified, needle tip was noted to be adjacent to the nerve/plexus identified and no ultrasound evidence of intravascular and/or intraneural injection Following insertion, dressing applied and Biopatch. Post procedure assessment: normal and unchanged  Patient tolerated the procedure well with no immediate complications.

## 2020-12-14 NOTE — ED Notes (Signed)
Per MD Freida Busman, pt to go straight to OR, bypass CT scan

## 2020-12-14 NOTE — ED Notes (Signed)
Evidence collected by [employer]. Justice, badge number [phone]

## 2020-12-14 NOTE — Progress Notes (Signed)
Orthopedic Tech Progress Note Patient Details:  Allen Mayer 1960-06-02 [id number]  Patient ID: Allen Mayer, male   DOB: 02-Apr-1961, 60 y.o.   MRN: [id number] Responded to trauma page, no ortho tech needs at this time.  Thanks,  Corinna Capra, PT, DPT  Acute Rehabilitation Ortho Tech Supervisor [phone] pager #[phone] office    Allen Mayer 12/14/2020, 10:14 PM

## 2020-12-14 NOTE — Progress Notes (Signed)
   12/14/20 2047  Clinical Encounter Type  Visited With Patient not available  Visit Type Trauma  Referral From Nurse  Consult/Referral To Chaplain   Chaplain responded. The patient is being attended to by the medical team. Chaplain remains available.This note was prepared by Deneen Harts, M.Div..  For questions please contact by phone [phone].
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
## 2020-12-14 NOTE — Progress Notes (Signed)
RRT at bedside, Airway is patent. Patient is answering questioning appropriately. No respiratory compromise noted. Oxygenation stable on RA. MD at bedside   Everlean Alstrom L. Katrinka Blazing, BS, RRT-ACCS, RCP

## 2020-12-14 NOTE — ED Notes (Signed)
142/80 manual

## 2020-12-14 NOTE — Anesthesia Procedure Notes (Signed)
Procedure Name: Intubation Date/Time: 12/14/2020 9:48 PM Performed by: Claudina Lick, CRNA Pre-anesthesia Checklist: Patient identified, Emergency Drugs available, Suction available and Patient being monitored Patient Re-evaluated:Patient Re-evaluated prior to induction Oxygen Delivery Method: Circle system utilized Preoxygenation: Pre-oxygenation with 100% oxygen Induction Type: IV induction, Rapid sequence and Cricoid Pressure applied Laryngoscope Size: Miller and 2 Grade View: Grade I Tube type: Oral Tube size: 8.0 mm Number of attempts: 1 Airway Equipment and Method: Stylet and Oral airway Placement Confirmation: ETT inserted through vocal cords under direct vision, positive ETCO2 and breath sounds checked- equal and bilateral Secured at: 23 cm Tube secured with: Tape Dental Injury: Teeth and Oropharynx as per pre-operative assessment

## 2020-12-15 ENCOUNTER — Inpatient Hospital Stay (HOSPITAL_COMMUNITY): Payer: Medicaid Other

## 2020-12-15 ENCOUNTER — Encounter (HOSPITAL_COMMUNITY): Payer: Self-pay

## 2020-12-15 ENCOUNTER — Other Ambulatory Visit: Payer: Self-pay

## 2020-12-15 DIAGNOSIS — S41111A Laceration without foreign body of right upper arm, initial encounter: Secondary | ICD-10-CM | POA: Diagnosis present

## 2020-12-15 DIAGNOSIS — S41112A Laceration without foreign body of left upper arm, initial encounter: Secondary | ICD-10-CM | POA: Diagnosis present

## 2020-12-15 LAB — URINALYSIS, ROUTINE W REFLEX MICROSCOPIC
Bacteria, UA: NONE SEEN
Bilirubin Urine: NEGATIVE
Glucose, UA: NEGATIVE mg/dL
Hgb urine dipstick: NEGATIVE
Ketones, ur: NEGATIVE mg/dL
Leukocytes,Ua: NEGATIVE
Nitrite: NEGATIVE
Protein, ur: 100 mg/dL — AB
Specific Gravity, Urine: 1.019 (ref 1.005–1.030)
pH: 5 (ref 5.0–8.0)

## 2020-12-15 LAB — BASIC METABOLIC PANEL
Anion gap: 9 (ref 5–15)
BUN: 14 mg/dL (ref 8–23)
CO2: 24 mmol/L (ref 22–32)
Calcium: 9.3 mg/dL (ref 8.9–10.3)
Chloride: 103 mmol/L (ref 98–111)
Creatinine, Ser: 1.2 mg/dL (ref 0.61–1.24)
GFR, Estimated: >60 mL/min (ref >60)
Glucose, Bld: 82 mg/dL (ref 70–99)
Potassium: 4.5 mmol/L (ref 3.5–5.1)
Sodium: 136 mmol/L (ref 135–145)

## 2020-12-15 LAB — GLUCOSE, CAPILLARY
Glucose-Capillary: 103 mg/dL — ABNORMAL HIGH (ref 70–99)
Glucose-Capillary: 107 mg/dL — ABNORMAL HIGH (ref 70–99)
Glucose-Capillary: 123 mg/dL — ABNORMAL HIGH (ref 70–99)
Glucose-Capillary: 86 mg/dL (ref 70–99)
Glucose-Capillary: 89 mg/dL (ref 70–99)
Glucose-Capillary: 95 mg/dL (ref 70–99)
Glucose-Capillary: 98 mg/dL (ref 70–99)

## 2020-12-15 LAB — CBC
HCT: 39.3 % (ref 39.0–52.0)
Hemoglobin: 13.2 g/dL (ref 13.0–17.0)
MCH: 29.9 pg (ref 26.0–34.0)
MCHC: 33.6 g/dL (ref 30.0–36.0)
MCV: 88.9 fL (ref 80.0–100.0)
Platelets: 168 10*3/uL (ref 150–400)
RBC: 4.42 MIL/uL (ref 4.22–5.81)
RDW: 14.4 % (ref 11.5–15.5)
WBC: 10.7 10*3/uL — ABNORMAL HIGH (ref 4.0–10.5)
nRBC: 0 % (ref 0.0–0.2)

## 2020-12-15 LAB — HIV ANTIBODY (ROUTINE TESTING W REFLEX): HIV Screen 4th Generation wRfx: NONREACTIVE

## 2020-12-15 LAB — HEMOGLOBIN A1C
Hgb A1c MFr Bld: 6 % — ABNORMAL HIGH (ref 4.8–5.6)
Mean Plasma Glucose: 125.5 mg/dL

## 2020-12-15 LAB — MRSA NEXT GEN BY PCR, NASAL: MRSA by PCR Next Gen: NOT DETECTED

## 2020-12-15 LAB — TRIGLYCERIDES: Triglycerides: 42 mg/dL (ref <150)

## 2020-12-15 MED ORDER — ALBUTEROL SULFATE (2.5 MG/3ML) 0.083% IN NEBU: 2.5000 mg | INHALATION_SOLUTION | RESPIRATORY_TRACT | Status: DC | PRN

## 2020-12-15 MED ORDER — DOCUSATE SODIUM 50 MG/5ML PO LIQD
100.0000 mg | Freq: Two times a day (BID) | ORAL | Status: DC
  Administered 2020-12-15 (×2): 100 mg
  Filled 2020-12-15 (×2): qty 10

## 2020-12-15 MED ORDER — ISOSORBIDE DINITRATE 5 MG PO TABS
7.5000 mg | ORAL_TABLET | Freq: Two times a day (BID) | ORAL | Status: DC
  Administered 2020-12-15 (×2): 7.5 mg
  Filled 2020-12-15 (×3): qty 2

## 2020-12-15 MED ORDER — ORAL CARE MOUTH RINSE
15.0000 mL | OROMUCOSAL | Status: DC
  Administered 2020-12-15 – 2020-12-16 (×10): 15 mL via OROMUCOSAL

## 2020-12-15 MED ORDER — FENTANYL 2500MCG IN NS 250ML (10MCG/ML) PREMIX INFUSION
50.0000 ug/h | INTRAVENOUS | Status: DC
  Administered 2020-12-15: 50 ug/h via INTRAVENOUS
  Filled 2020-12-15: qty 250

## 2020-12-15 MED ORDER — FENTANYL BOLUS VIA INFUSION
50.0000 ug | INTRAVENOUS | Status: DC | PRN
  Administered 2020-12-15 (×2): 50 ug via INTRAVENOUS
  Filled 2020-12-15: qty 100

## 2020-12-15 MED ORDER — ISOSORBIDE MONONITRATE ER 30 MG PO TB24: 15.0000 mg | ORAL_TABLET | Freq: Every day | ORAL | Status: DC

## 2020-12-15 MED ORDER — FENTANYL CITRATE PF 50 MCG/ML IJ SOSY
50.0000 ug | PREFILLED_SYRINGE | Freq: Once | INTRAMUSCULAR | Status: AC
  Administered 2020-12-15: 50 ug via INTRAVENOUS
  Filled 2020-12-15: qty 1

## 2020-12-15 MED ORDER — HYDRALAZINE HCL 25 MG PO TABS
25.0000 mg | ORAL_TABLET | Freq: Three times a day (TID) | ORAL | Status: DC
  Administered 2020-12-15 – 2020-12-16 (×4): 25 mg
  Filled 2020-12-15 (×4): qty 1

## 2020-12-15 MED ORDER — POLYETHYLENE GLYCOL 3350 17 G PO PACK: 17.0000 g | PACK | Freq: Every day | ORAL | Status: DC

## 2020-12-15 MED ORDER — CHLORHEXIDINE GLUCONATE 0.12% ORAL RINSE (MEDLINE KIT)
15.0000 mL | Freq: Two times a day (BID) | OROMUCOSAL | Status: DC
  Administered 2020-12-15 – 2020-12-17 (×5): 15 mL via OROMUCOSAL

## 2020-12-15 MED ORDER — CARVEDILOL 3.125 MG PO TABS
6.2500 mg | ORAL_TABLET | Freq: Two times a day (BID) | ORAL | Status: DC
  Administered 2020-12-15 (×2): 6.25 mg
  Filled 2020-12-15 (×2): qty 2

## 2020-12-15 MED ORDER — ARIPIPRAZOLE 10 MG PO TABS
10.0000 mg | ORAL_TABLET | Freq: Two times a day (BID) | ORAL | Status: DC
  Administered 2020-12-15 (×2): 10 mg
  Filled 2020-12-15 (×3): qty 1

## 2020-12-15 MED ORDER — 0.9 % SODIUM CHLORIDE (POUR BTL) OPTIME
TOPICAL | Status: DC | PRN
  Administered 2020-12-14: 1000 mL

## 2020-12-15 MED ORDER — CITALOPRAM HYDROBROMIDE 10 MG PO TABS
20.0000 mg | ORAL_TABLET | Freq: Every day | ORAL | Status: DC
  Administered 2020-12-15: 20 mg via NASOGASTRIC
  Filled 2020-12-15: qty 2

## 2020-12-15 MED ORDER — HYDRALAZINE HCL 20 MG/ML IJ SOLN
10.0000 mg | INTRAMUSCULAR | Status: DC | PRN
  Administered 2020-12-15: 10 mg via INTRAVENOUS
  Filled 2020-12-15: qty 1

## 2020-12-15 MED ORDER — CHLORHEXIDINE GLUCONATE CLOTH 2 % EX PADS
6.0000 | MEDICATED_PAD | Freq: Every day | CUTANEOUS | Status: DC
  Administered 2020-12-15 – 2020-12-17 (×3): 6 via TOPICAL

## 2020-12-15 NOTE — Progress Notes (Signed)
Transition of Care [facility]) - CAGE-AID Screening   Patient Details  Name: Allen Mayer MRN: [id number] Date of Birth: May 24, 1960  Ranae Plumber, RN 12/15/2020, 4:13 AM   Clinical Narrative: Pt is intubated   CAGE-AID Screening: Substance Abuse Screening unable to be completed due to: : Patient unable to participate (Pt intubated)

## 2020-12-15 NOTE — Plan of Care (Signed)
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
## 2020-12-15 NOTE — Transfer of Care (Signed)
Immediate Anesthesia Transfer of Care Note  Patient: Allen Mayer  Procedure(s) Performed: EXPLORATORY LAPAROTOMY IRRIGATION AND DEBRIDEMENT AND CLOSURE OF LACERTATIONS OF BILATEAL ARMS (Bilateral)  Patient Location: ICU  Anesthesia Type:General  Level of Consciousness: sedated and Patient remains intubated per anesthesia plan  Airway & Oxygen Therapy: Patient remains intubated per anesthesia plan and Patient placed on Ventilator (see vital sign flow sheet for setting)  Post-op Assessment: Report given to RN and Post -op Vital signs reviewed and stable  Post vital signs: Reviewed and stable  Last Vitals:  Vitals Value Taken Time  BP 147/90 12/15/20 0000  Temp    Pulse 73 12/15/20 0004  Resp 18 12/15/20 0004  SpO2 98 % 12/15/20 0004  Vitals shown include unvalidated device data.  Last Pain:  Vitals:   12/14/20 2128  TempSrc:   PainSc: 9          Complications: No notable events documented.

## 2020-12-16 LAB — BASIC METABOLIC PANEL
Anion gap: 10 (ref 5–15)
BUN: 11 mg/dL (ref 8–23)
CO2: 21 mmol/L — ABNORMAL LOW (ref 22–32)
Calcium: 8.7 mg/dL — ABNORMAL LOW (ref 8.9–10.3)
Chloride: 102 mmol/L (ref 98–111)
Creatinine, Ser: 1.18 mg/dL (ref 0.61–1.24)
GFR, Estimated: >60 mL/min (ref >60)
Glucose, Bld: 95 mg/dL (ref 70–99)
Potassium: 3.8 mmol/L (ref 3.5–5.1)
Sodium: 133 mmol/L — ABNORMAL LOW (ref 135–145)

## 2020-12-16 LAB — CBC
HCT: 35 % — ABNORMAL LOW (ref 39.0–52.0)
Hemoglobin: 11.6 g/dL — ABNORMAL LOW (ref 13.0–17.0)
MCH: 30 pg (ref 26.0–34.0)
MCHC: 33.1 g/dL (ref 30.0–36.0)
MCV: 90.4 fL (ref 80.0–100.0)
Platelets: 154 10*3/uL (ref 150–400)
RBC: 3.87 MIL/uL — ABNORMAL LOW (ref 4.22–5.81)
RDW: 14.4 % (ref 11.5–15.5)
WBC: 7.1 10*3/uL (ref 4.0–10.5)
nRBC: 0 % (ref 0.0–0.2)

## 2020-12-16 LAB — GLUCOSE, CAPILLARY
Glucose-Capillary: 107 mg/dL — ABNORMAL HIGH (ref 70–99)
Glucose-Capillary: 111 mg/dL — ABNORMAL HIGH (ref 70–99)
Glucose-Capillary: 85 mg/dL (ref 70–99)
Glucose-Capillary: 94 mg/dL (ref 70–99)
Glucose-Capillary: 96 mg/dL (ref 70–99)
Glucose-Capillary: 98 mg/dL (ref 70–99)

## 2020-12-16 MED ORDER — ISOSORBIDE DINITRATE 5 MG PO TABS
7.5000 mg | ORAL_TABLET | Freq: Two times a day (BID) | ORAL | Status: DC
  Administered 2020-12-16 – 2020-12-18 (×2): 7.5 mg via ORAL
  Filled 2020-12-16 (×7): qty 2

## 2020-12-16 MED ORDER — CARVEDILOL 6.25 MG PO TABS
6.2500 mg | ORAL_TABLET | Freq: Two times a day (BID) | ORAL | Status: DC
  Administered 2020-12-18: 6.25 mg via ORAL
  Filled 2020-12-16 (×2): qty 2
  Filled 2020-12-16: qty 1

## 2020-12-16 MED ORDER — CITALOPRAM HYDROBROMIDE 20 MG PO TABS
20.0000 mg | ORAL_TABLET | Freq: Every day | ORAL | Status: DC
  Administered 2020-12-18: 20 mg via ORAL
  Filled 2020-12-16: qty 1
  Filled 2020-12-16 (×2): qty 2

## 2020-12-16 MED ORDER — HYDRALAZINE HCL 25 MG PO TABS
25.0000 mg | ORAL_TABLET | Freq: Three times a day (TID) | ORAL | Status: DC
  Administered 2020-12-16 – 2020-12-18 (×6): 25 mg via ORAL
  Filled 2020-12-16 (×6): qty 1

## 2020-12-16 MED ORDER — OXYCODONE HCL 5 MG PO TABS
5.0000 mg | ORAL_TABLET | ORAL | Status: DC | PRN
Start: 2020-12-16 — End: 2020-12-18
  Administered 2020-12-16: 10 mg via ORAL
  Administered 2020-12-16: 5 mg via ORAL
  Administered 2020-12-17 – 2020-12-18 (×4): 10 mg via ORAL
  Filled 2020-12-16: qty 2
  Filled 2020-12-16: qty 1
  Filled 2020-12-16 (×4): qty 2

## 2020-12-16 MED ORDER — BOOST / RESOURCE BREEZE PO LIQD CUSTOM
1.0000 | Freq: Two times a day (BID) | ORAL | Status: DC
Start: 2020-12-16 — End: 2020-12-18
  Administered 2020-12-16 – 2020-12-18 (×3): 1 via ORAL

## 2020-12-16 MED ORDER — FENTANYL CITRATE PF 50 MCG/ML IJ SOSY
50.0000 ug | PREFILLED_SYRINGE | INTRAMUSCULAR | Status: DC | PRN
  Administered 2020-12-17: 50 ug via INTRAVENOUS
  Filled 2020-12-16: qty 1

## 2020-12-16 MED ORDER — ARIPIPRAZOLE 10 MG PO TABS
10.0000 mg | ORAL_TABLET | Freq: Two times a day (BID) | ORAL | Status: DC
  Administered 2020-12-16 – 2020-12-18 (×3): 10 mg via ORAL
  Filled 2020-12-16 (×6): qty 1

## 2020-12-16 NOTE — Procedures (Signed)
Extubation Procedure Note  Patient Details:   Name: Allen Mayer DOB: 12/02/1960 MRN: [id number]   Airway Documentation:    Vent end date: 12/16/20 Vent end time: 0818   Evaluation  O2 sats: stable throughout Complications: No apparent complications Patient did tolerate procedure well. Bilateral Breath Sounds: Clear  Patient able to speak Yes  Peggye Form 12/16/2020, 8:21 AM
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
## 2020-12-17 LAB — GLUCOSE, CAPILLARY
Glucose-Capillary: 99 mg/dL (ref 70–99)
Glucose-Capillary: 98 mg/dL (ref 70–99)
Glucose-Capillary: 107 mg/dL — ABNORMAL HIGH (ref 70–99)
Glucose-Capillary: 109 mg/dL — ABNORMAL HIGH (ref 70–99)
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
## 2020-12-17 NOTE — Anesthesia Postprocedure Evaluation (Signed)
Anesthesia Post Note  Patient: Allen Mayer  Procedure(s) Performed: EXPLORATORY LAPAROTOMY IRRIGATION AND DEBRIDEMENT AND CLOSURE OF LACERTATIONS OF BILATEAL ARMS (Bilateral)     Patient location during evaluation: SICU Anesthesia Type: General Level of consciousness: sedated Pain management: pain level controlled Vital Signs Assessment: post-procedure vital signs reviewed and stable Respiratory status: patient remains intubated per anesthesia plan Cardiovascular status: stable Postop Assessment: no apparent nausea or vomiting Anesthetic complications: no   No notable events documented.  Last Vitals:  Vitals:   12/17/20 1536 12/17/20 1806  BP: (!) 152/87 (!) 148/86  Pulse: 78 75  Resp: 20   Temp: 36.9 C 37 C  SpO2: 100% 100%    Last Pain:  Vitals:   12/17/20 1806  TempSrc: Oral  PainSc:                  Earline Stiner
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
## 2020-12-17 NOTE — Anesthesia Postprocedure Evaluation (Signed)
Anesthesia Post Note  Patient: Allen Mayer  Procedure(s) Performed: EXPLORATORY LAPAROTOMY IRRIGATION AND DEBRIDEMENT AND CLOSURE OF LACERTATIONS OF BILATEAL ARMS (Bilateral)     Patient location during evaluation: SICU Anesthesia Type: General Level of consciousness: sedated Pain management: pain level controlled Vital Signs Assessment: post-procedure vital signs reviewed and stable Respiratory status: patient remains intubated per anesthesia plan Cardiovascular status: stable Postop Assessment: no apparent nausea or vomiting Anesthetic complications: no   No notable events documented.  Last Vitals:  Vitals:   12/17/20 1536 12/17/20 1806  BP: (!) 152/87 (!) 148/86  Pulse: 78 75  Resp: 20   Temp: 36.9 C 37 C  SpO2: 100% 100%    Last Pain:  Vitals:   12/17/20 1806  TempSrc: Oral  PainSc:                  Allen Mayer     
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
## 2020-12-18 LAB — GLUCOSE, CAPILLARY: Glucose-Capillary: 94 mg/dL (ref 70–99)

## 2020-12-18 MED ORDER — METHOCARBAMOL 500 MG PO TABS: 500.0000 mg | ORAL_TABLET | Freq: Four times a day (QID) | ORAL | Status: DC | PRN

## 2020-12-18 MED ORDER — OXYCODONE HCL 5 MG PO TABS: 5.0000 mg | ORAL_TABLET | Freq: Four times a day (QID) | ORAL | 0 refills | Status: DC | PRN

## 2020-12-18 MED ORDER — METHOCARBAMOL 500 MG PO TABS: 500.0000 mg | ORAL_TABLET | Freq: Four times a day (QID) | ORAL | 1 refills | Status: DC | PRN

## 2020-12-18 MED ORDER — ACETAMINOPHEN 500 MG PO TABS: 1000.0000 mg | ORAL_TABLET | Freq: Four times a day (QID) | ORAL | Status: DC

## 2020-12-18 MED ORDER — ACETAMINOPHEN 500 MG PO TABS: 1000.0000 mg | ORAL_TABLET | Freq: Four times a day (QID) | ORAL | 0 refills | Status: DC | PRN

## 2020-12-18 MED ORDER — CHLORHEXIDINE GLUCONATE 0.12 % MT SOLN
OROMUCOSAL | Status: AC
  Administered 2020-12-18: 15 mL via OROMUCOSAL
  Filled 2020-12-18: qty 15

## 2020-12-18 NOTE — Progress Notes (Signed)
Patient suffers from ex lap which impairs their ability to perform daily activities like bathing and toileting in the home.  A cane or crutch will not resolve issue with performing activities of daily living. A wheelchair will allow patient to safely perform daily activities. Patient can safely propel the wheelchair in the home or has a caregiver who can provide assistance. Length of need 6 months . Accessories: elevating leg rests (ELRs), wheel locks, extensions and anti-tippers.  Allen Mayer
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
## 2020-12-18 NOTE — Progress Notes (Signed)
Allen Mayer to be D/C'd  per MD order.  Discussed with the patient and all questions fully answered.  VSS, Skin clean, dry and intact without evidence of skin break down, no evidence of skin tears noted.  IV catheter discontinued intact. Site without signs and symptoms of complications. Dressing and pressure applied.  An After Visit Summary was printed and given to the patient. Patient received prescription.  D/c education completed with patient/family including follow up instructions, medication list, d/c activities limitations if indicated, with other d/c instructions as indicated by MD - patient able to verbalize understanding, all questions fully answered.   Patient instructed to return to ED, call 911, or call MD for any changes in condition.   Patient to be escorted via WC, and D/C home via private auto.
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
## 2020-12-18 NOTE — Discharge Instructions (Signed)
  Managing Your Pain After Surgery Without Opioids    Thank you for participating in our program to help patients manage their pain after surgery without opioids. This is part of our effort to provide you with the best care possible, without exposing you or your family to the risk that opioids pose.  What pain can I expect after surgery? You can expect to have some pain after surgery. This is normal. The pain is typically worse the day after surgery, and quickly begins to get better. Many studies have found that many patients are able to manage their pain after surgery with Over-the-Counter (OTC) medications such as Tylenol and Motrin. If you have a condition that does not allow you to take Tylenol or Motrin, notify your surgical team.  How will I manage my pain? The best strategy for controlling your pain after surgery is around the clock pain control with Tylenol (acetaminophen) and Motrin (ibuprofen or Advil). Alternating these medications with each other allows you to maximize your pain control. In addition to Tylenol and Motrin, you can use heating pads or ice packs on your incisions to help reduce your pain.  How will I alternate your regular strength over-the-counter pain medication? You will take a dose of pain medication every three hours. Start by taking 650 mg of Tylenol (2 pills of 325 mg) 3 hours later take 600 mg of Motrin (3 pills of 200 mg) 3 hours after taking the Motrin take 650 mg of Tylenol 3 hours after that take 600 mg of Motrin.   - 1 -  See example - if your first dose of Tylenol is at 12:00 PM   12:00 PM Tylenol 650 mg (2 pills of 325 mg)  3:00 PM Motrin 600 mg (3 pills of 200 mg)  6:00 PM Tylenol 650 mg (2 pills of 325 mg)  9:00 PM Motrin 600 mg (3 pills of 200 mg)  Continue alternating every 3 hours   We recommend that you follow this schedule around-the-clock for at least 3 days after surgery, or until you feel that it is no longer needed. Use the table  on the last page of this handout to keep track of the medications you are taking. Important: Do not take more than 3000mg of Tylenol or 3200mg of Motrin in a 24-hour period. Do not take ibuprofen/Motrin if you have a history of bleeding stomach ulcers, severe kidney disease, &/or actively taking a blood thinner  What if I still have pain? If you have pain that is not controlled with the over-the-counter pain medications (Tylenol and Motrin or Advil) you might have what we call "breakthrough" pain. You will receive a prescription for a small amount of an opioid pain medication such as Oxycodone, Tramadol, or Tylenol with Codeine. Use these opioid pills in the first 24 hours after surgery if you have breakthrough pain. Do not take more than 1 pill every 4-6 hours.  If you still have uncontrolled pain after using all opioid pills, don't hesitate to call our staff using the number provided. We will help make sure you are managing your pain in the best way possible, and if necessary, we can provide a prescription for additional pain medication.   Day 1    Time  Name of Medication Number of pills taken  Amount of Acetaminophen  Pain Level   Comments  AM PM       AM PM       AM PM         AM PM       AM PM       AM PM       AM PM       AM PM       Total Daily amount of Acetaminophen Do not take more than  3,000 mg per day      Day 2    Time  Name of Medication Number of pills taken  Amount of Acetaminophen  Pain Level   Comments  AM PM       AM PM       AM PM       AM PM       AM PM       AM PM       AM PM       AM PM       Total Daily amount of Acetaminophen Do not take more than  3,000 mg per day      Day 3    Time  Name of Medication Number of pills taken  Amount of Acetaminophen  Pain Level   Comments  AM PM       AM PM       AM PM       AM PM          AM PM       AM PM       AM PM       AM PM       Total Daily amount of Acetaminophen Do not take  more than  3,000 mg per day      Day 4    Time  Name of Medication Number of pills taken  Amount of Acetaminophen  Pain Level   Comments  AM PM       AM PM       AM PM       AM PM       AM PM       AM PM       AM PM       AM PM       Total Daily amount of Acetaminophen Do not take more than  3,000 mg per day      Day 5    Time  Name of Medication Number of pills taken  Amount of Acetaminophen  Pain Level   Comments  AM PM       AM PM       AM PM       AM PM       AM PM       AM PM       AM PM       AM PM       Total Daily amount of Acetaminophen Do not take more than  3,000 mg per day       Day 6    Time  Name of Medication Number of pills taken  Amount of Acetaminophen  Pain Level  Comments  AM PM       AM PM       AM PM       AM PM       AM PM       AM PM       AM PM       AM PM       Total Daily amount of Acetaminophen Do not take more than  3,000 mg per day      Day 7    Time  Name of Medication Number of pills taken  Amount of Acetaminophen  Pain Level   Comments  AM PM       AM PM       AM PM       AM PM       AM PM       AM PM       AM PM       AM PM       Total Daily amount of Acetaminophen Do not take more than  3,000 mg per day        For additional information about how and where to safely dispose of unused opioid medications - [email or web address]  Disclaimer: This document contains information and/or instructional materials adapted from [facility] for the typical patient with your condition. It does not replace medical advice from your health care provider because your experience may differ from that of the typical patient. Talk to your health care provider if you have any questions about this document, your condition or your treatment plan. Adapted from [facility], [place] [phone]  OPEN ABDOMINAL SURGERY: POST OP INSTRUCTIONS  Always review  your discharge instruction sheet given to you by the facility where your surgery was performed.  IF YOU HAVE DISABILITY OR FAMILY LEAVE FORMS, YOU MUST BRING THEM TO THE OFFICE FOR PROCESSING.  PLEASE DO NOT GIVE THEM TO YOUR DOCTOR.  A prescription for pain medication may be given to you upon discharge.  Take your pain medication as prescribed, if needed.  If narcotic pain medicine is not needed, then you may take acetaminophen (Tylenol) or ibuprofen (Advil) as needed. Take your usually prescribed medications unless otherwise directed. If you need a refill on your pain medication, please contact your pharmacy. They will contact our office to request authorization.  Prescriptions will not be filled after 5pm or on week-ends. You should follow a light diet the first few days after arrival home, such as soup and crackers, pudding, etc.unless your doctor has advised otherwise. A high-fiber, low fat diet can be resumed as tolerated.   Be sure to include lots of fluids daily. Most patients will experience some swelling and bruising on the chest and neck area.  Ice packs will help.  Swelling and bruising can take several days to resolve Most patients will experience some swelling and bruising in the area of the incision. Ice pack will help. Swelling and bruising can take several days to resolve..  It is common to experience some constipation if taking pain medication after surgery.  Increasing fluid intake and taking a stool softener will usually help or prevent this problem from occurring.  A mild laxative (Milk of Magnesia or Miralax) should be taken according to package directions if there are no bowel movements after 48 hours.  You may have steri-strips (small skin tapes) in place directly over the incision.  These strips should be left on the skin for 7-10 days.  If your surgeon used skin glue on the incision, you may shower in 24 hours.  The glue will flake off over the next 2-3 weeks.  Any sutures or  staples will be removed at the office during your follow-up visit. You may find that a light gauze bandage over your incision may keep your staples from being rubbed or pulled. You may shower and  replace the bandage daily. ACTIVITIES:  You may resume regular (light) daily activities beginning the next day--such as daily self-care, walking, climbing stairs--gradually increasing activities as tolerated.  You may have sexual intercourse when it is comfortable.  Refrain from any heavy lifting or straining until approved by your doctor. You may drive when you no longer are taking prescription pain medication, you can comfortably wear a seatbelt, and you can safely maneuver your car and apply brakes Return to Work: ___________________________________ Bonita Quin should see your doctor in the office for a follow-up appointment approximately two weeks after your surgery.  Make sure that you call for this appointment within a day or two after you arrive home to insure a convenient appointment time. OTHER INSTRUCTIONS:  _____________________________________________________________ _____________________________________________________________  WHEN TO CALL YOUR DOCTOR: Fever over 101.0 Inability to urinate Nausea and/or vomiting Extreme swelling or bruising Continued bleeding from incision. Increased pain, redness, or drainage from the incision. Difficulty swallowing or breathing Muscle cramping or spasms. Numbness or tingling in hands or feet or around lips.  The clinic staff is available to answer your questions during regular business hours.  Please don't hesitate to call and ask to speak to one of the nurses if you have concerns.  For further questions, please visit [email or web address]

## 2020-12-19 ENCOUNTER — Other Ambulatory Visit: Payer: Self-pay | Admitting: Cardiology

## 2020-12-21 ENCOUNTER — Encounter (HOSPITAL_COMMUNITY): Payer: Self-pay | Admitting: Family Medicine

## 2020-12-23 ENCOUNTER — Emergency Department (HOSPITAL_COMMUNITY)
Admission: EM | Admit: 2020-12-23 | Discharge: 2020-12-23 | Disposition: A | Discharge status: Home / Self Care | Payer: Medicaid Other | Attending: Emergency Medicine | Admitting: Emergency Medicine

## 2020-12-23 DIAGNOSIS — X58XXXD Exposure to other specified factors, subsequent encounter: Secondary | ICD-10-CM | POA: Diagnosis not present

## 2020-12-23 DIAGNOSIS — I5042 Chronic combined systolic (congestive) and diastolic (congestive) heart failure: Secondary | ICD-10-CM | POA: Insufficient documentation

## 2020-12-23 DIAGNOSIS — J45909 Unspecified asthma, uncomplicated: Secondary | ICD-10-CM | POA: Diagnosis not present

## 2020-12-23 DIAGNOSIS — Z79899 Other long term (current) drug therapy: Secondary | ICD-10-CM | POA: Insufficient documentation

## 2020-12-23 DIAGNOSIS — Z955 Presence of coronary angioplasty implant and graft: Secondary | ICD-10-CM | POA: Insufficient documentation

## 2020-12-23 DIAGNOSIS — J449 Chronic obstructive pulmonary disease, unspecified: Secondary | ICD-10-CM | POA: Insufficient documentation

## 2020-12-23 DIAGNOSIS — Z4801 Encounter for change or removal of surgical wound dressing: Secondary | ICD-10-CM | POA: Diagnosis not present

## 2020-12-23 DIAGNOSIS — I25119 Atherosclerotic heart disease of native coronary artery with unspecified angina pectoris: Secondary | ICD-10-CM | POA: Diagnosis not present

## 2020-12-23 DIAGNOSIS — Z7951 Long term (current) use of inhaled steroids: Secondary | ICD-10-CM | POA: Diagnosis not present

## 2020-12-23 DIAGNOSIS — F17210 Nicotine dependence, cigarettes, uncomplicated: Secondary | ICD-10-CM | POA: Insufficient documentation

## 2020-12-23 DIAGNOSIS — S51012D Laceration without foreign body of left elbow, subsequent encounter: Secondary | ICD-10-CM | POA: Diagnosis not present

## 2020-12-23 DIAGNOSIS — N183 Chronic kidney disease, stage 3 unspecified: Secondary | ICD-10-CM | POA: Insufficient documentation

## 2020-12-23 DIAGNOSIS — I130 Hypertensive heart and chronic kidney disease with heart failure and stage 1 through stage 4 chronic kidney disease, or unspecified chronic kidney disease: Secondary | ICD-10-CM | POA: Insufficient documentation

## 2020-12-23 NOTE — Discharge Instructions (Addendum)
You came to the hospital today to be evaluated for your bleeding stitches and discuss further pain management.  Your physical exam was reassuring.  Please follow-up with the trauma clinic to discuss pain management and have your wound reevaluated.  Get help right away if: You develop severe swelling around the wound. Your pain suddenly increases and is severe. You develop painful lumps near the wound or on skin anywhere else on your body. You have a red streak going away from your wound. The wound is on your hand or foot, and you cannot properly move a finger or toe. The wound is on your hand or foot, and you notice that your fingers or toes look pale or bluish.
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
## 2021-01-11 ENCOUNTER — Other Ambulatory Visit: Payer: Self-pay | Admitting: Cardiology

## 2021-04-07 ENCOUNTER — Encounter: Payer: Self-pay | Admitting: *Deleted

## 2021-04-07 NOTE — Congregational Nurse Program (Signed)
°  Dept: [phone]   Congregational Nurse Program Note  Date of Encounter: 04/07/2021  Past Medical History: Past Medical History:  Diagnosis Date   Acid reflux    Alcohol abuse    Arthritis    Asthma    Back pain    Bronchitis    CHF (congestive heart failure) (HCC)    CKD (chronic kidney disease)    CKD (chronic kidney disease), stage III (HCC)    Cocaine abuse (HCC)    COPD (chronic obstructive pulmonary disease) (HCC)    History of noncompliance with medical treatment, presenting hazards to health    Homelessness    Hypertension    LV (left ventricular) mural thrombus    Myocardial infarction (HCC)    Neuropathic pain    NICM (nonischemic cardiomyopathy) (HCC) 2019   NSVT (nonsustained ventricular tachycardia) (HCC)    Pericardial effusion    Pulmonary hypertension (HCC)    RVF (right ventricular failure) (HCC)    Schizo affective schizophrenia (HCC)     Encounter Details:  CNP Questionnaire - 04/07/21 1438       Questionnaire   Do you give verbal consent to treat you today? Yes    Location Patient Served  [facility], [facility], [facility]    Visit Setting [place] or [address]Phone/Text/Email    Patient Status Unknown    Insurance Medicaid;Private or [employer] Referral N/A    Medication N/A    Medical Provider No    Screening Referrals N/A    Medical Referral Other    Medical Appointment Made [facility] PCP[facility]clinic    Food N/A    Transportation Need transportation assistance    Housing/Utilities N/A    Interpersonal Safety N/A    Intervention Support    ED Visit Averted N/A    Life-Saving Intervention Made N/A            Client came into nurse's office in a hurry. He reports his boss at work is picking him up soon. Client is requesting help with a doctor's appt. He wants to see his doctor at [phone] [facility] [phone]. He has been to [facility] previously. Called and made an appt for Feb 16th at 3:10. Pt to be at office by 2:45. He is to see  [facility] NP [phone].

## 2021-04-15 ENCOUNTER — Other Ambulatory Visit: Payer: Self-pay | Admitting: Adult Health
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
## 2021-05-27 ENCOUNTER — Ambulatory Visit: Payer: Medicaid Other | Admitting: General Practice

## 2021-06-05 IMAGING — CR DG CHEST 2V
2 series · 2 of 2 positions shown · non-contrast
Comparison: 02/28/2020

CLINICAL DATA: Left-sided chest pain with cough and shortness of
breath.

EXAM:
CHEST - 2 VIEW

[chest pa]
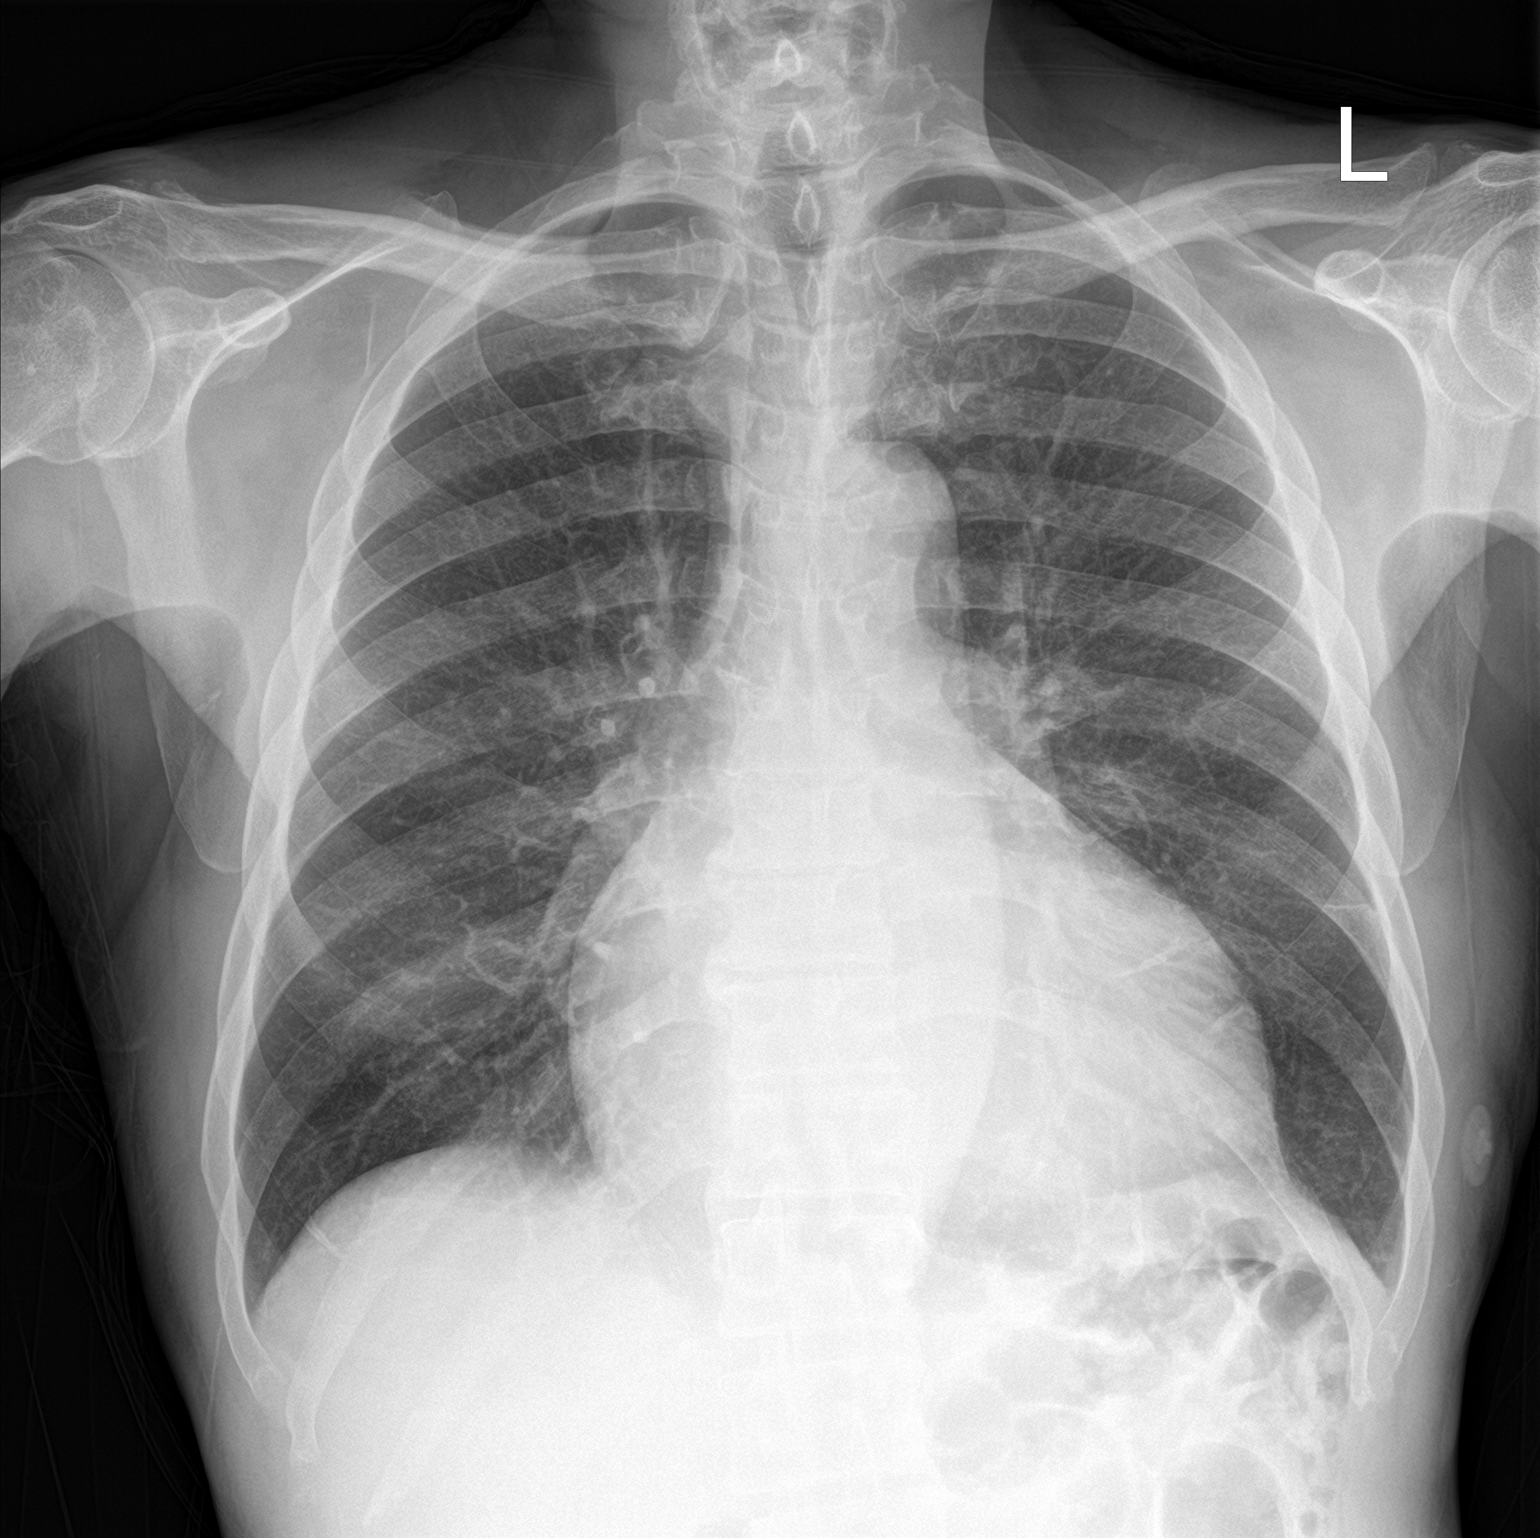

[chest lat]
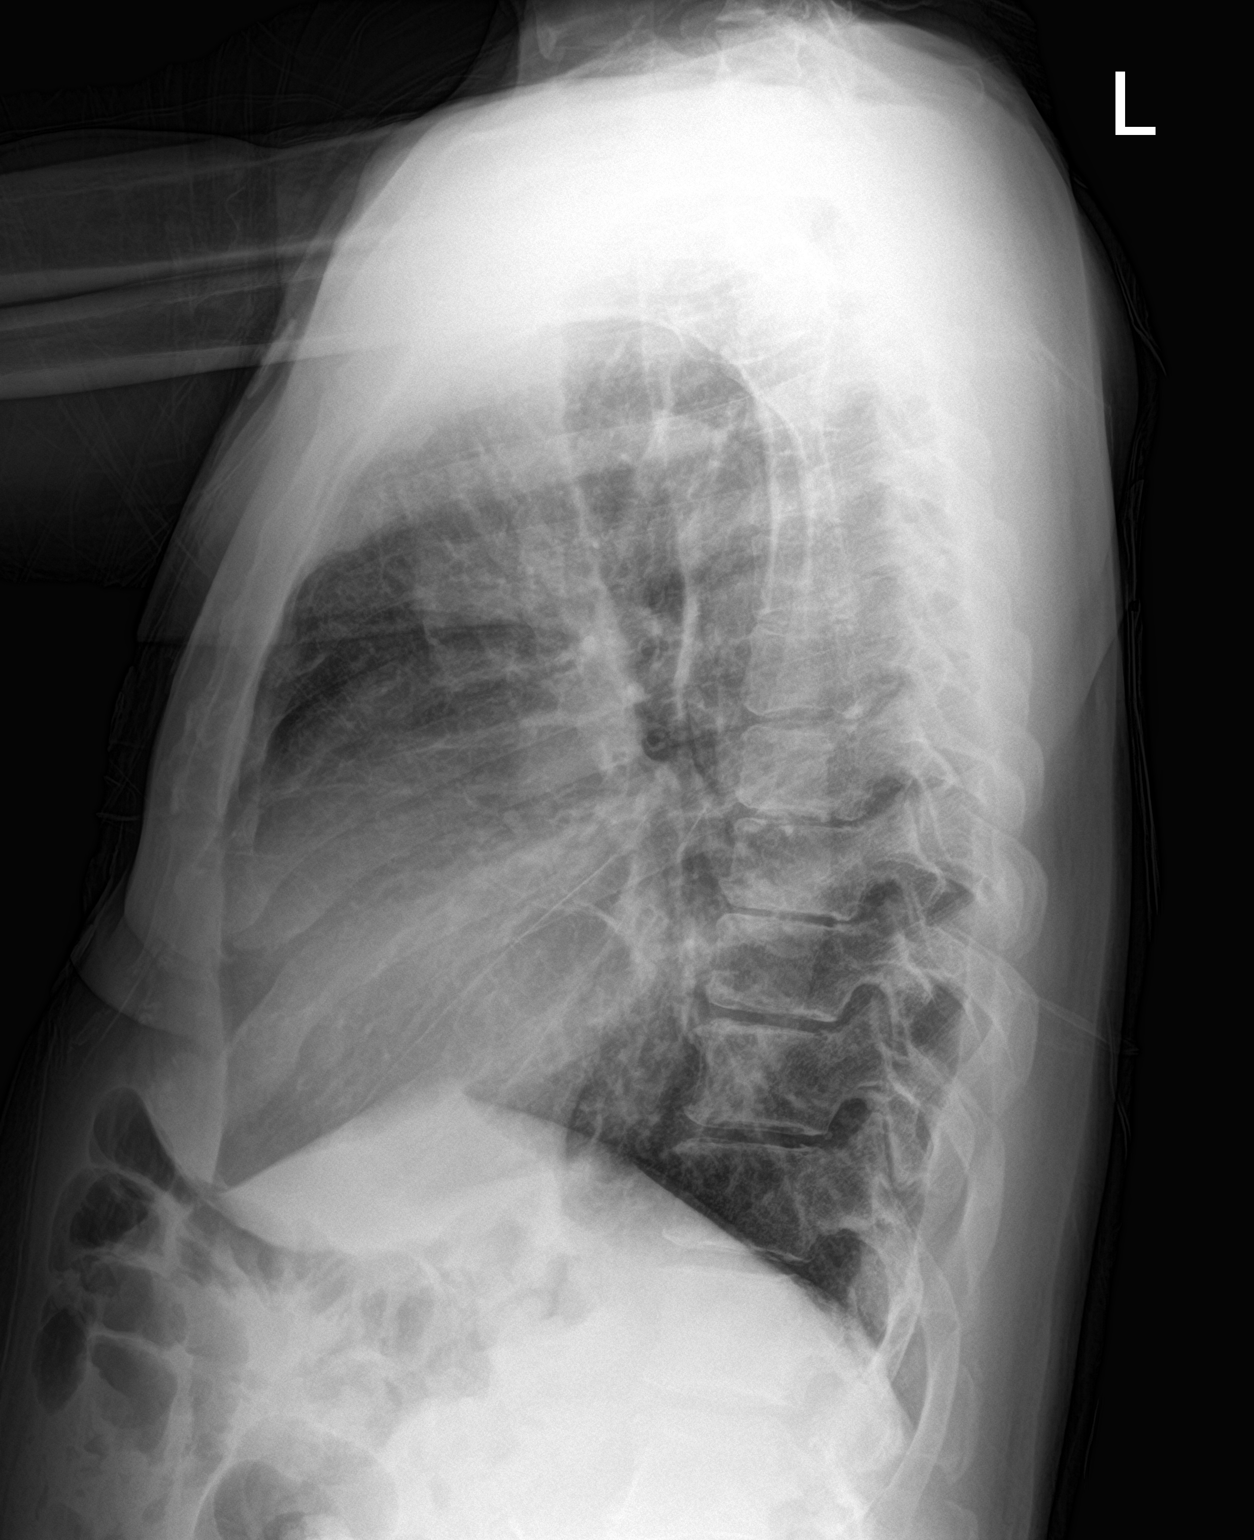

[2 of 2 positions shown; findings below may reference images not displayed]

FINDINGS: The cardiac silhouette remains mildly to moderately enlarged. No
airspace consolidation, edema, pleural effusion, or pneumothorax is
identified. No acute osseous abnormality is seen.
IMPRESSION: No active cardiopulmonary disease.

## 2021-07-12 ENCOUNTER — Other Ambulatory Visit: Payer: Self-pay | Admitting: Adult Health

## 2021-07-26 ENCOUNTER — Encounter (HOSPITAL_COMMUNITY): Payer: Self-pay | Admitting: Emergency Medicine

## 2021-07-26 ENCOUNTER — Emergency Department (HOSPITAL_COMMUNITY)
Admission: EM | Admit: 2021-07-26 | Discharge: 2021-07-26 | Disposition: A | Discharge status: Home / Self Care | Payer: Medicaid Other | Attending: Emergency Medicine | Admitting: Emergency Medicine

## 2021-07-26 ENCOUNTER — Emergency Department (HOSPITAL_COMMUNITY): Payer: Medicaid Other

## 2021-07-26 ENCOUNTER — Other Ambulatory Visit: Payer: Self-pay

## 2021-07-26 ENCOUNTER — Other Ambulatory Visit: Payer: Self-pay | Admitting: Cardiology

## 2021-07-26 ENCOUNTER — Other Ambulatory Visit: Payer: Self-pay | Admitting: Adult Health

## 2021-07-26 DIAGNOSIS — I509 Heart failure, unspecified: Secondary | ICD-10-CM | POA: Diagnosis not present

## 2021-07-26 DIAGNOSIS — N189 Chronic kidney disease, unspecified: Secondary | ICD-10-CM | POA: Diagnosis not present

## 2021-07-26 DIAGNOSIS — J449 Chronic obstructive pulmonary disease, unspecified: Secondary | ICD-10-CM | POA: Diagnosis not present

## 2021-07-26 DIAGNOSIS — R0602 Shortness of breath: Secondary | ICD-10-CM | POA: Diagnosis not present

## 2021-07-26 DIAGNOSIS — Z79899 Other long term (current) drug therapy: Secondary | ICD-10-CM | POA: Diagnosis not present

## 2021-07-26 DIAGNOSIS — R109 Unspecified abdominal pain: Secondary | ICD-10-CM | POA: Diagnosis present

## 2021-07-26 DIAGNOSIS — R1084 Generalized abdominal pain: Secondary | ICD-10-CM

## 2021-07-26 LAB — CBC WITH DIFFERENTIAL/PLATELET
Abs Immature Granulocytes: 0.02 10*3/uL (ref 0.00–0.07)
Basophils Absolute: 0.1 10*3/uL (ref 0.0–0.1)
Basophils Relative: 1 %
Eosinophils Absolute: 0.2 10*3/uL (ref 0.0–0.5)
Eosinophils Relative: 3 %
HCT: 47.2 % (ref 39.0–52.0)
Hemoglobin: 15.3 g/dL (ref 13.0–17.0)
Immature Granulocytes: 0 %
Lymphocytes Relative: 38 %
Lymphs Abs: 2.1 10*3/uL (ref 0.7–4.0)
MCH: 29.1 pg (ref 26.0–34.0)
MCHC: 32.4 g/dL (ref 30.0–36.0)
MCV: 89.9 fL (ref 80.0–100.0)
Monocytes Absolute: 0.7 10*3/uL (ref 0.1–1.0)
Monocytes Relative: 12 %
Neutro Abs: 2.6 10*3/uL (ref 1.7–7.7)
Neutrophils Relative %: 46 %
Platelets: 194 10*3/uL (ref 150–400)
RBC: 5.25 MIL/uL (ref 4.22–5.81)
RDW: 15.7 % — ABNORMAL HIGH (ref 11.5–15.5)
WBC: 5.6 10*3/uL (ref 4.0–10.5)
nRBC: 0 % (ref 0.0–0.2)

## 2021-07-26 LAB — URINALYSIS, ROUTINE W REFLEX MICROSCOPIC
Bacteria, UA: NONE SEEN
Bilirubin Urine: NEGATIVE
Glucose, UA: NEGATIVE mg/dL
Ketones, ur: NEGATIVE mg/dL
Leukocytes,Ua: NEGATIVE
Nitrite: NEGATIVE
Protein, ur: >=300 mg/dL — AB
Specific Gravity, Urine: 1.026 (ref 1.005–1.030)
pH: 5 (ref 5.0–8.0)

## 2021-07-26 LAB — COMPREHENSIVE METABOLIC PANEL
ALT: 35 U/L (ref 0–44)
AST: 35 U/L (ref 15–41)
Albumin: 3.4 g/dL — ABNORMAL LOW (ref 3.5–5.0)
Alkaline Phosphatase: 77 U/L (ref 38–126)
Anion gap: 10 (ref 5–15)
BUN: 26 mg/dL — ABNORMAL HIGH (ref 8–23)
CO2: 21 mmol/L — ABNORMAL LOW (ref 22–32)
Calcium: 9.2 mg/dL (ref 8.9–10.3)
Chloride: 106 mmol/L (ref 98–111)
Creatinine, Ser: 1.48 mg/dL — ABNORMAL HIGH (ref 0.61–1.24)
GFR, Estimated: 53 mL/min — ABNORMAL LOW (ref >60)
Glucose, Bld: 106 mg/dL — ABNORMAL HIGH (ref 70–99)
Potassium: 4.5 mmol/L (ref 3.5–5.1)
Sodium: 137 mmol/L (ref 135–145)
Total Bilirubin: 1.8 mg/dL — ABNORMAL HIGH (ref 0.3–1.2)
Total Protein: 6.6 g/dL (ref 6.5–8.1)

## 2021-07-26 LAB — LIPASE, BLOOD: Lipase: 26 U/L (ref 11–51)

## 2021-07-26 LAB — TROPONIN I (HIGH SENSITIVITY)
Troponin I (High Sensitivity): 40 ng/L — ABNORMAL HIGH (ref <18)
Troponin I (High Sensitivity): 28 ng/L — ABNORMAL HIGH (ref <18)

## 2021-07-26 LAB — BRAIN NATRIURETIC PEPTIDE: B Natriuretic Peptide: 2369.2 pg/mL — ABNORMAL HIGH (ref 0.0–100.0)

## 2021-07-26 MED ORDER — ALBUTEROL SULFATE HFA 108 (90 BASE) MCG/ACT IN AERS: 1.0000 | INHALATION_SPRAY | Freq: Four times a day (QID) | RESPIRATORY_TRACT | 0 refills | Status: DC | PRN

## 2021-07-26 MED ORDER — TORSEMIDE 20 MG PO TABS: 20.0000 mg | ORAL_TABLET | Freq: Two times a day (BID) | ORAL | 0 refills | Status: DC

## 2021-07-26 MED ORDER — POLYETHYLENE GLYCOL 3350 17 G PO PACK
17.0000 g | PACK | Freq: Every day | ORAL | Status: DC
  Administered 2021-07-26: 17 g via ORAL
  Filled 2021-07-26: qty 1

## 2021-07-26 MED ORDER — TORSEMIDE 20 MG PO TABS
20.0000 mg | ORAL_TABLET | ORAL | Status: AC
  Administered 2021-07-26: 20 mg via ORAL
  Filled 2021-07-26: qty 1

## 2021-07-26 NOTE — ED Provider Triage Note (Signed)
Emergency Medicine Provider Triage Evaluation Note ? ?Allen Mayer , a 61 y.o. male  was evaluated in triage.  Pt complains of shortness of breath and pain in the abdomen on inspiration.  Patient reportedly has had episodes like this in the past, states he would normally have a bowel movement and this improves it.  He states he had a bowel movement shortly before EMS arrived, however he states it was very small.  He does feel constipated.  Denies any coughing,fevers or chills.  No chest pain.  He endorses nausea but no vomiting.  Denies any dysuria. ? ?Review of Systems  ?Positive: As above ?Negative: As above ? ?Physical Exam  ?There were no vitals taken for this visit. ?Gen:   Awake, no distress   ?Resp:  Normal effort  ?MSK:   Moves extremities without difficulty  ?Other:  Abdomen is soft, with mild generalized tenderness.  Lung sounds with scattered wheezes ? ?Medical Decision Making  ?Medically screening exam initiated at 6:06 AM.  Appropriate orders placed.  Allen Mayer was informed that the remainder of the evaluation will be completed by another provider, this initial triage assessment does not replace that evaluation, and the importance of remaining in the ED until their evaluation is complete. ? ? ?  ?Allen Ferrari, PA-C ?07/26/21 7116 ? ?

## 2021-07-26 NOTE — ED Provider Notes (Signed)
?[facility] EMERGENCY DEPARTMENT ?Provider Note ? ? ?CSN: [id number] ?Arrival date & time: 07/26/21  0559 ? ?  ? ?History ? ?Chief Complaint  ?Patient presents with  ? Abdominal Pain  ? ? ?Allen Mayer is a 61 y.o. male with history of bronchitis, CKD, cocaine abuse, COPD, pulmonary hypertension, CHF.  Patient presents ED for evaluation of abdominal pain worsened with inspiration and shortness of breath.  Patient states that he has been short of breath for the last 3 days, states he is also noticed wheezing.  Patient reports that he called EMS last night who provided him with DuoNeb treatment which she states improved shortness of breath.  Patient continues that he also feels as if he is constipated and sometimes when he is constipated it affects his breathing.  Patient states that in the past when he had a bowel movement is improved his shortness of breath.  Patient reports that he has had decreased stool over the last few days.  Patient endorsing abdominal pain, constipation, shortness of breath.  The patient denies any chest pain, fever, cough, lower extremity swelling, nausea, vomiting, painful urination. ? ? ?Abdominal Pain ?Associated symptoms: constipation and shortness of breath   ?Associated symptoms: no chest pain, no dysuria, no fever, no nausea and no vomiting   ? ?  ? ?Home Medications ?Prior to Admission medications   ?Medication Sig Start Date End Date Taking? Authorizing Provider  ?acetaminophen (TYLENOL) 500 MG tablet Take 2 tablets (1,000 mg total) by mouth every 6 (six) hours as needed. ?Patient taking differently: Take 1,000 mg by mouth every 6 (six) hours as needed for moderate pain. 12/18/20  Yes Barnetta Chapel, PA-C  ?albuterol (VENTOLIN HFA) 108 (90 Base) MCG/ACT inhaler Inhale 1-2 puffs into the lungs every 6 (six) hours as needed for wheezing or shortness of breath. 07/26/21  Yes Al Decant, PA-C  ?carvedilol (COREG) 6.25 MG tablet Take 1 tablet (6.25 mg total)  by mouth 2 (two) times daily. 09/18/20  Yes Jodelle Gross, NP  ?FLOVENT HFA 110 MCG/ACT inhaler Inhale 2 puffs into the lungs 2 (two) times daily. 12/04/19  Yes Rollene Rotunda, MD  ?hydrALAZINE (APRESOLINE) 25 MG tablet Take 1 tablet (25 mg total) by mouth every 8 (eight) hours. ** FOR BLOOD PRESSURE** ?Patient taking differently: Take 25 mg by mouth every 8 (eight) hours. 07/12/21  Yes Rollene Rotunda, MD  ?isosorbide mononitrate (IMDUR) 30 MG 24 hr tablet Take 0.5 tablets (15 mg total) by mouth daily. ?Patient taking differently: Take 30 mg by mouth daily. 05/22/20 07/26/21 Yes Jodelle Gross, NP  ?nitroGLYCERIN (NITROSTAT) 0.4 MG SL tablet Place 1 tablet (0.4 mg total) under the tongue every 5 (five) minutes as needed for chest pain. 01/11/21  Yes Rollene Rotunda, MD  ?torsemide (DEMADEX) 20 MG tablet Take 1 tablet (20 mg total) by mouth 2 (two) times daily. 07/26/21  Yes Al Decant, PA-C  ?citalopram (CELEXA) 20 MG tablet Take 1 tablet (20 mg total) by mouth daily. ?Patient not taking: Reported on 07/26/2021 05/22/20   Jodelle Gross, NP  ?gabapentin (NEURONTIN) 400 MG capsule Take 1 capsule (400 mg total) by mouth daily as needed (nerve pain). ?Patient not taking: Reported on 07/26/2021 05/22/20   Jodelle Gross, NP  ?methocarbamol (ROBAXIN) 500 MG tablet Take 1 tablet (500 mg total) by mouth every 6 (six) hours as needed for muscle spasms. ?Patient not taking: Reported on 07/26/2021 12/18/20   Barnetta Chapel, PA-C  ?oxyCODONE (OXY IR/ROXICODONE) 5  MG immediate release tablet Take 1 tablet (5 mg total) by mouth 3 (three) times daily as needed (pain). ?Patient not taking: Reported on 07/26/2021 11/02/19   Dimple Nanas, MD  ?oxyCODONE (OXY IR/ROXICODONE) 5 MG immediate release tablet Take 1-2 tablets (5-10 mg total) by mouth every 6 (six) hours as needed. ?Patient not taking: Reported on 07/26/2021 12/18/20   Barnetta Chapel, PA-C  ?   ? ?Allergies    ?Patient has no known allergies.   ? ?Review  of Systems   ?Review of Systems  ?Constitutional:  Negative for fever.  ?Respiratory:  Positive for shortness of breath. Negative for wheezing.   ?Cardiovascular:  Negative for chest pain and leg swelling.  ?Gastrointestinal:  Positive for abdominal pain and constipation. Negative for nausea and vomiting.  ?Genitourinary:  Negative for dysuria.  ?All other systems reviewed and are negative. ? ?Physical Exam ?Updated Vital Signs ?BP (!) 150/118   Pulse 93   Temp 97.9 ?F (36.6 ?C) (Oral)   Resp 17   SpO2 100%  ?Physical Exam ?Vitals and nursing note reviewed.  ?Constitutional:   ?   General: He is not in acute distress. ?   Appearance: He is well-developed. He is not ill-appearing, toxic-appearing or diaphoretic.  ?HENT:  ?   Head: Normocephalic and atraumatic.  ?   Mouth/Throat:  ?   Mouth: Mucous membranes are moist.  ?Eyes:  ?   Extraocular Movements: Extraocular movements intact.  ?   Pupils: Pupils are equal, round, and reactive to light.  ?Cardiovascular:  ?   Rate and Rhythm: Normal rate and regular rhythm.  ?Pulmonary:  ?   Effort: Pulmonary effort is normal. No respiratory distress.  ?   Breath sounds: Normal breath sounds. No wheezing, rhonchi or rales.  ?Abdominal:  ?   General: Abdomen is flat. Bowel sounds are normal. There is no distension. There are no signs of injury.  ?   Palpations: Abdomen is soft.  ?   Tenderness: There is generalized abdominal tenderness.  ?Musculoskeletal:  ?   Cervical back: Normal range of motion and neck supple. No tenderness.  ?   Right lower leg: No edema.  ?   Left lower leg: No edema.  ?Skin: ?   General: Skin is warm and dry.  ?   Capillary Refill: Capillary refill takes less than 2 seconds.  ?Neurological:  ?   Mental Status: He is alert and oriented to person, place, and time.  ? ? ?ED Results / Procedures / Treatments   ?Labs ?(all labs ordered are listed, but only abnormal results are displayed) ?Labs Reviewed  ?CBC WITH DIFFERENTIAL/PLATELET - Abnormal; Notable  for the following components:  ?    Result Value  ? RDW 15.7 (*)   ? All other components within normal limits  ?COMPREHENSIVE METABOLIC PANEL - Abnormal; Notable for the following components:  ? CO2 21 (*)   ? Glucose, Bld 106 (*)   ? BUN 26 (*)   ? Creatinine, Ser 1.48 (*)   ? Albumin 3.4 (*)   ? Total Bilirubin 1.8 (*)   ? GFR, Estimated 53 (*)   ? All other components within normal limits  ?BRAIN NATRIURETIC PEPTIDE - Abnormal; Notable for the following components:  ? B Natriuretic Peptide 2,369.2 (*)   ? All other components within normal limits  ?URINALYSIS, ROUTINE W REFLEX MICROSCOPIC - Abnormal; Notable for the following components:  ? Color, Urine AMBER (*)   ? APPearance HAZY (*)   ?  Hgb urine dipstick SMALL (*)   ? Protein, ur >=300 (*)   ? All other components within normal limits  ?TROPONIN I (HIGH SENSITIVITY) - Abnormal; Notable for the following components:  ? Troponin I (High Sensitivity) 40 (*)   ? All other components within normal limits  ?TROPONIN I (HIGH SENSITIVITY) - Abnormal; Notable for the following components:  ? Troponin I (High Sensitivity) 28 (*)   ? All other components within normal limits  ?LIPASE, BLOOD  ? ? ?EKG ?EKG Interpretation ? ?Date/Time:  Monday July 26 2021 06:21:26 EDT ?Ventricular Rate:  89 ?PR Interval:  224 ?QRS Duration: 94 ?QT Interval:  404 ?QTC Calculation: 491 ?R Axis:   -75 ?Text Interpretation: Sinus rhythm with 1st degree A-V block with occasional Premature ventricular complexes Left axis deviation Minimal voltage criteria for LVH, may be normal variant ( Cornell product ) Inferior infarct , age undetermined Anterior infarct , age undetermined T wave abnormality, consider lateral ischemia Abnormal ECG When compared with ECG of 25-Oct-2020 23:31, PREVIOUS ECG IS PRESENT Confirmed by Ernie Avena ([id number]) on 07/26/2021 8:27:03 PM ? ?Radiology ?DG Chest 2 View ? ?Result Date: 07/26/2021 ?CLINICAL DATA:  Shortness of breath EXAM: CHEST - 2 VIEW COMPARISON:   September 2022 FINDINGS: Mild atelectasis/scarring at the lung bases. Stable cardiomegaly. No pleural effusion or pneumothorax. No acute osseous abnormality. IMPRESSION: Mild atelectasis/scarring at the lung bases

## 2021-07-26 NOTE — Discharge Instructions (Signed)
Return to the ED with any new symptoms such as increased shortness of breath, chest pain, lower extremity swelling ?Please pick up medications I sent to the pharmacy requested.  I have refilled your torsemide and you will be taking this twice daily. ?Please pick up your albuterol inhaler and use it as needed ?Please follow-up with the cardiology team that you have seen in the past ?

## 2021-07-26 NOTE — ED Triage Notes (Signed)
Pt brought to [facility] with c/o  pain in abdomen when trying to take deep breath. Was complaining of shortness of breath earlier in the evening and [facility] administered breathing treatment with first call.Has hx of COPD.  ? ?EMS VITALS ?HR 74 ?BP 144/102 ?RR 18 ?SPO2 98% RA ?

## 2021-07-27 ENCOUNTER — Telehealth: Payer: Self-pay | Admitting: *Deleted

## 2021-07-27 NOTE — Telephone Encounter (Signed)
Pt significant other called in stating pharmacy has not received e-script for Rx.  RNCM called pharmacy to confirm and or do verbal order and pharmacy confirmed receipt of script.  RNCM returned call to pt to notify him of findings and advised to pick up Rx at his leisure. ?

## 2021-07-27 NOTE — Telephone Encounter (Signed)
The original prescription was discontinued on 07/26/2021 by Al Decant, PA-C for the following reason: Completed Course ?

## 2021-07-31 ENCOUNTER — Inpatient Hospital Stay (HOSPITAL_COMMUNITY)
Admission: EM | Admit: 2021-07-31 | Discharge: 2021-08-03 | DRG: 280 | Disposition: A | Discharge status: Home / Self Care | Payer: Medicaid Other | Attending: Cardiovascular Disease | Admitting: Cardiovascular Disease

## 2021-07-31 ENCOUNTER — Emergency Department (HOSPITAL_COMMUNITY): Payer: Medicaid Other

## 2021-07-31 ENCOUNTER — Encounter (HOSPITAL_COMMUNITY): Payer: Self-pay | Admitting: *Deleted

## 2021-07-31 ENCOUNTER — Other Ambulatory Visit: Payer: Self-pay

## 2021-07-31 DIAGNOSIS — J449 Chronic obstructive pulmonary disease, unspecified: Secondary | ICD-10-CM | POA: Diagnosis present

## 2021-07-31 DIAGNOSIS — Z8249 Family history of ischemic heart disease and other diseases of the circulatory system: Secondary | ICD-10-CM | POA: Diagnosis not present

## 2021-07-31 DIAGNOSIS — N1831 Chronic kidney disease, stage 3a: Secondary | ICD-10-CM | POA: Diagnosis present

## 2021-07-31 DIAGNOSIS — I5023 Acute on chronic systolic (congestive) heart failure: Secondary | ICD-10-CM | POA: Diagnosis present

## 2021-07-31 DIAGNOSIS — I428 Other cardiomyopathies: Secondary | ICD-10-CM | POA: Diagnosis present

## 2021-07-31 DIAGNOSIS — I252 Old myocardial infarction: Secondary | ICD-10-CM | POA: Diagnosis not present

## 2021-07-31 DIAGNOSIS — F1410 Cocaine abuse, uncomplicated: Secondary | ICD-10-CM | POA: Diagnosis present

## 2021-07-31 DIAGNOSIS — I130 Hypertensive heart and chronic kidney disease with heart failure and stage 1 through stage 4 chronic kidney disease, or unspecified chronic kidney disease: Secondary | ICD-10-CM | POA: Diagnosis present

## 2021-07-31 DIAGNOSIS — Z79899 Other long term (current) drug therapy: Secondary | ICD-10-CM | POA: Diagnosis not present

## 2021-07-31 DIAGNOSIS — F259 Schizoaffective disorder, unspecified: Secondary | ICD-10-CM | POA: Diagnosis present

## 2021-07-31 DIAGNOSIS — N179 Acute kidney failure, unspecified: Secondary | ICD-10-CM | POA: Diagnosis present

## 2021-07-31 DIAGNOSIS — Z5900 Homelessness unspecified: Secondary | ICD-10-CM

## 2021-07-31 DIAGNOSIS — F17210 Nicotine dependence, cigarettes, uncomplicated: Secondary | ICD-10-CM | POA: Diagnosis present

## 2021-07-31 DIAGNOSIS — I2510 Atherosclerotic heart disease of native coronary artery without angina pectoris: Secondary | ICD-10-CM | POA: Diagnosis present

## 2021-07-31 DIAGNOSIS — I513 Intracardiac thrombosis, not elsewhere classified: Secondary | ICD-10-CM | POA: Diagnosis not present

## 2021-07-31 DIAGNOSIS — M1990 Unspecified osteoarthritis, unspecified site: Secondary | ICD-10-CM | POA: Diagnosis present

## 2021-07-31 DIAGNOSIS — R079 Chest pain, unspecified: Principal | ICD-10-CM

## 2021-07-31 DIAGNOSIS — I2129 ST elevation (STEMI) myocardial infarction involving other sites: Secondary | ICD-10-CM | POA: Diagnosis not present

## 2021-07-31 DIAGNOSIS — I2729 Other secondary pulmonary hypertension: Secondary | ICD-10-CM | POA: Diagnosis present

## 2021-07-31 DIAGNOSIS — I214 Non-ST elevation (NSTEMI) myocardial infarction: Secondary | ICD-10-CM | POA: Diagnosis present

## 2021-07-31 HISTORY — DX: Chest pain, unspecified: R07.9

## 2021-07-31 HISTORY — DX: Non-ST elevation (NSTEMI) myocardial infarction: I21.4

## 2021-07-31 LAB — TROPONIN I (HIGH SENSITIVITY)
Troponin I (High Sensitivity): 46 ng/L — ABNORMAL HIGH (ref <18)
Troponin I (High Sensitivity): 159 ng/L (ref <18)
Troponin I (High Sensitivity): 1901 ng/L (ref <18)

## 2021-07-31 LAB — CBC WITH DIFFERENTIAL/PLATELET
Abs Immature Granulocytes: 0 10*3/uL (ref 0.00–0.07)
Basophils Absolute: 0.1 10*3/uL (ref 0.0–0.1)
Basophils Relative: 1 %
Eosinophils Absolute: 0.3 10*3/uL (ref 0.0–0.5)
Eosinophils Relative: 4 %
HCT: 45 % (ref 39.0–52.0)
Hemoglobin: 14.3 g/dL (ref 13.0–17.0)
Immature Granulocytes: 0 %
Lymphocytes Relative: 23 %
Lymphs Abs: 1.5 10*3/uL (ref 0.7–4.0)
MCH: 29.1 pg (ref 26.0–34.0)
MCHC: 31.8 g/dL (ref 30.0–36.0)
MCV: 91.5 fL (ref 80.0–100.0)
Monocytes Absolute: 0.6 10*3/uL (ref 0.1–1.0)
Monocytes Relative: 9 %
Neutro Abs: 4.1 10*3/uL (ref 1.7–7.7)
Neutrophils Relative %: 63 %
Platelets: 153 10*3/uL (ref 150–400)
RBC: 4.92 MIL/uL (ref 4.22–5.81)
RDW: 15.6 % — ABNORMAL HIGH (ref 11.5–15.5)
WBC: 6.5 10*3/uL (ref 4.0–10.5)
nRBC: 0 % (ref 0.0–0.2)

## 2021-07-31 LAB — LIPID PANEL
Cholesterol: 106 mg/dL (ref 0–200)
HDL: 28 mg/dL — ABNORMAL LOW (ref >40)
LDL Cholesterol: 69 mg/dL (ref 0–99)
Total CHOL/HDL Ratio: 3.8 RATIO
Triglycerides: 45 mg/dL (ref <150)
VLDL: 9 mg/dL (ref 0–40)

## 2021-07-31 LAB — BASIC METABOLIC PANEL
Anion gap: 6 (ref 5–15)
Anion gap: 7 (ref 5–15)
BUN: 26 mg/dL — ABNORMAL HIGH (ref 8–23)
BUN: 24 mg/dL — ABNORMAL HIGH (ref 8–23)
CO2: 27 mmol/L (ref 22–32)
CO2: 26 mmol/L (ref 22–32)
Calcium: 8.5 mg/dL — ABNORMAL LOW (ref 8.9–10.3)
Calcium: 8.5 mg/dL — ABNORMAL LOW (ref 8.9–10.3)
Chloride: 103 mmol/L (ref 98–111)
Chloride: 103 mmol/L (ref 98–111)
Creatinine, Ser: 1.66 mg/dL — ABNORMAL HIGH (ref 0.61–1.24)
Creatinine, Ser: 1.39 mg/dL — ABNORMAL HIGH (ref 0.61–1.24)
GFR, Estimated: 46 mL/min — ABNORMAL LOW (ref >60)
GFR, Estimated: 57 mL/min — ABNORMAL LOW (ref >60)
Glucose, Bld: 116 mg/dL — ABNORMAL HIGH (ref 70–99)
Glucose, Bld: 107 mg/dL — ABNORMAL HIGH (ref 70–99)
Potassium: 4.3 mmol/L (ref 3.5–5.1)
Potassium: 4.3 mmol/L (ref 3.5–5.1)
Sodium: 136 mmol/L (ref 135–145)
Sodium: 136 mmol/L (ref 135–145)

## 2021-07-31 LAB — RAPID URINE DRUG SCREEN, HOSP PERFORMED
Amphetamines: NOT DETECTED
Barbiturates: NOT DETECTED
Benzodiazepines: NOT DETECTED
Cocaine: POSITIVE — AB
Opiates: POSITIVE — AB
Tetrahydrocannabinol: NOT DETECTED

## 2021-07-31 LAB — CBC
HCT: 48.4 % (ref 39.0–52.0)
Hemoglobin: 15.8 g/dL (ref 13.0–17.0)
MCH: 29.4 pg (ref 26.0–34.0)
MCHC: 32.6 g/dL (ref 30.0–36.0)
MCV: 90 fL (ref 80.0–100.0)
Platelets: 160 10*3/uL (ref 150–400)
RBC: 5.38 MIL/uL (ref 4.22–5.81)
RDW: 15.6 % — ABNORMAL HIGH (ref 11.5–15.5)
WBC: 5.9 10*3/uL (ref 4.0–10.5)
nRBC: 0 % (ref 0.0–0.2)

## 2021-07-31 LAB — HEPARIN LEVEL (UNFRACTIONATED)
Heparin Unfractionated: 0.39 IU/mL (ref 0.30–0.70)
Heparin Unfractionated: 0.3 IU/mL (ref 0.30–0.70)

## 2021-07-31 LAB — BRAIN NATRIURETIC PEPTIDE: B Natriuretic Peptide: 2031.6 pg/mL — ABNORMAL HIGH (ref 0.0–100.0)

## 2021-07-31 LAB — ETHANOL: Alcohol, Ethyl (B): <10 mg/dL (ref <10)

## 2021-07-31 MED ORDER — HYDRALAZINE HCL 25 MG PO TABS
25.0000 mg | ORAL_TABLET | Freq: Three times a day (TID) | ORAL | Status: DC
  Administered 2021-07-31 – 2021-08-02 (×8): 25 mg via ORAL
  Filled 2021-07-31 (×8): qty 1

## 2021-07-31 MED ORDER — ALBUTEROL SULFATE (2.5 MG/3ML) 0.083% IN NEBU: 3.0000 mL | INHALATION_SOLUTION | Freq: Four times a day (QID) | RESPIRATORY_TRACT | Status: DC | PRN

## 2021-07-31 MED ORDER — ATORVASTATIN CALCIUM 80 MG PO TABS
80.0000 mg | ORAL_TABLET | Freq: Every day | ORAL | Status: DC
  Administered 2021-07-31 – 2021-08-03 (×4): 80 mg via ORAL
  Filled 2021-07-31: qty 1
  Filled 2021-07-31: qty 2
  Filled 2021-07-31 (×2): qty 1

## 2021-07-31 MED ORDER — CARVEDILOL 6.25 MG PO TABS
6.2500 mg | ORAL_TABLET | Freq: Two times a day (BID) | ORAL | Status: DC
  Administered 2021-07-31 – 2021-08-03 (×7): 6.25 mg via ORAL
  Filled 2021-07-31 (×3): qty 1
  Filled 2021-07-31: qty 2
  Filled 2021-07-31 (×3): qty 1

## 2021-07-31 MED ORDER — MORPHINE SULFATE (PF) 4 MG/ML IV SOLN
4.0000 mg | Freq: Once | INTRAVENOUS | Status: AC
  Administered 2021-07-31: 4 mg via INTRAVENOUS
  Filled 2021-07-31: qty 1

## 2021-07-31 MED ORDER — ACETAMINOPHEN 325 MG PO TABS
650.0000 mg | ORAL_TABLET | ORAL | Status: DC | PRN
  Filled 2021-07-31: qty 2

## 2021-07-31 MED ORDER — NITROGLYCERIN 0.4 MG SL SUBL
0.4000 mg | SUBLINGUAL_TABLET | SUBLINGUAL | Status: DC | PRN
  Administered 2021-07-31: 0.4 mg via SUBLINGUAL
  Filled 2021-07-31: qty 1

## 2021-07-31 MED ORDER — ONDANSETRON HCL 4 MG/2ML IJ SOLN
4.0000 mg | Freq: Once | INTRAMUSCULAR | Status: AC
  Administered 2021-07-31: 4 mg via INTRAVENOUS
  Filled 2021-07-31: qty 2

## 2021-07-31 MED ORDER — NITROGLYCERIN 0.4 MG SL SUBL: 0.4000 mg | SUBLINGUAL_TABLET | SUBLINGUAL | Status: DC | PRN

## 2021-07-31 MED ORDER — HEPARIN (PORCINE) 25000 UT/250ML-% IV SOLN
1300.0000 [IU]/h | INTRAVENOUS | Status: DC
  Administered 2021-07-31 (×2): 1200 [IU]/h via INTRAVENOUS
  Administered 2021-08-01: 1300 [IU]/h via INTRAVENOUS
  Filled 2021-07-31 (×3): qty 250

## 2021-07-31 MED ORDER — ONDANSETRON HCL 4 MG/2ML IJ SOLN
4.0000 mg | Freq: Four times a day (QID) | INTRAMUSCULAR | Status: DC | PRN
  Administered 2021-08-03: 4 mg via INTRAVENOUS
  Filled 2021-07-31: qty 2

## 2021-07-31 MED ORDER — TORSEMIDE 20 MG PO TABS
20.0000 mg | ORAL_TABLET | Freq: Two times a day (BID) | ORAL | Status: DC
  Administered 2021-07-31 – 2021-08-01 (×3): 20 mg via ORAL
  Filled 2021-07-31 (×4): qty 1

## 2021-07-31 MED ORDER — ASPIRIN EC 81 MG PO TBEC
81.0000 mg | DELAYED_RELEASE_TABLET | Freq: Every day | ORAL | Status: DC
  Administered 2021-08-01 – 2021-08-03 (×2): 81 mg via ORAL
  Filled 2021-07-31 (×2): qty 1

## 2021-07-31 MED ORDER — ACETAMINOPHEN 325 MG PO TABS: 650.0000 mg | ORAL_TABLET | Freq: Four times a day (QID) | ORAL | Status: DC | PRN

## 2021-07-31 MED ORDER — ISOSORBIDE MONONITRATE ER 30 MG PO TB24
15.0000 mg | ORAL_TABLET | Freq: Every day | ORAL | Status: DC
  Administered 2021-07-31 – 2021-08-02 (×3): 15 mg via ORAL
  Filled 2021-07-31 (×3): qty 1

## 2021-07-31 MED ORDER — BUDESONIDE 0.5 MG/2ML IN SUSP
0.5000 mg | Freq: Two times a day (BID) | RESPIRATORY_TRACT | Status: DC
  Administered 2021-07-31 – 2021-08-03 (×6): 0.5 mg via RESPIRATORY_TRACT
  Filled 2021-07-31 (×9): qty 2

## 2021-07-31 MED ORDER — HEPARIN BOLUS VIA INFUSION
4000.0000 [IU] | Freq: Once | INTRAVENOUS | Status: AC
  Administered 2021-07-31: 4000 [IU] via INTRAVENOUS
  Filled 2021-07-31: qty 4000

## 2021-07-31 NOTE — ED Notes (Signed)
Breakfast order placed ?

## 2021-07-31 NOTE — Progress Notes (Signed)
ANTICOAGULATION CONSULT NOTE - Follow-Up ? ?Pharmacy Consult for Heparin ?Indication: chest pain/ACS ? ?No Known Allergies ? ?Patient Measurements: ?Height: 6' (182.9 cm) ?Weight: 97.8 kg (215 lb 9.8 oz) ?IBW/kg (Calculated) : 77.6 ?Heparin Dosing Weight: 97.2 kg ? ?Vital Signs: ?Temp: 97.8 ?F (36.6 ?C) (04/22 0154) ?BP: 131/96 (04/22 1000) ?Pulse Rate: 72 (04/22 0800) ? ?Labs: ?Recent Labs  ?  07/31/21 ?0204 07/31/21 ?[id number] 07/31/21 ?0810  ?HGB 14.3  --   --   ?HCT 45.0  --   --   ?PLT 153  --   --   ?CREATININE 1.66*  --  1.39*  ?TROPONINIHS 46* 159* 1,901*  ? ? ?Estimated Creatinine Clearance: 66.8 mL/min (A) (by C-G formula based on SCr of 1.39 mg/dL (H)). ? ? ?Medical History: ?Past Medical History:  ?Diagnosis Date  ? Acid reflux   ? Alcohol abuse   ? Arthritis   ? Asthma   ? Back pain   ? Bronchitis   ? Chest pain 07/31/2021  ? CHF (congestive heart failure) ([place])   ? CKD (chronic kidney disease)   ? CKD (chronic kidney disease), stage III ([place])   ? Cocaine abuse ([place])   ? COPD (chronic obstructive pulmonary disease) ([place])   ? History of noncompliance with medical treatment, presenting hazards to health   ? Homelessness   ? Hypertension   ? LV (left ventricular) mural thrombus   ? Myocardial infarction [facility])   ? Neuropathic pain   ? NICM (nonischemic cardiomyopathy) ([place]) 2019  ? NSTEMI (non-ST elevated myocardial infarction) ([place]) 07/31/2021  ? NSVT (nonsustained ventricular tachycardia) ([place])   ? Pericardial effusion   ? Pulmonary hypertension ([place])   ? RVF (right ventricular failure) ([place])   ? Schizo affective schizophrenia ([place])   ? ? ?Medications:  ?(Not in a hospital admission) ? ?Scheduled:  ? [START ON 08/01/2021] aspirin EC  81 mg Oral Daily  ? atorvastatin  80 mg Oral Daily  ? budesonide  0.5 mg Inhalation BID  ? carvedilol  6.25 mg Oral BID  ? hydrALAZINE  25 mg Oral Q8H  ? isosorbide mononitrate  15 mg Oral Daily  ? torsemide  20 mg Oral BID  ? ?Infusions:  ? heparin 1,200 Units/hr (07/31/21 0720)   ? ?PRN: acetaminophen, albuterol, nitroGLYCERIN, ondansetron (ZOFRAN) IV ? ?Assessment: ?41 yom with a history of polysubstance abuse, COPD, renal insufficiency, nonischemic cardiomyopathy with EF < 20%, LV thrombus, schizophrenia. Patient is presenting with chest pain. Heparin per pharmacy consult placed for chest pain/ACS. ? ?Patient is not on anticoagulation prior to arrival. Heparin started at 1200 unit/hr following 4000 unit iv bolus. ? ?Heparin level this morning back at 0.39, which is therapeutic. No issues with infusion or bleeding per RN. ? ?Hgb 14.3; plt 153 ?hsTrop 159>>1901 ? ?Goal of Therapy:  ?Heparin level 0.3-0.7 units/ml ?Monitor platelets by anticoagulation protocol: Yes ?  ?Plan:  ?Resume heparin infusion at 1200 units/hr ?Check anti-Xa level at 1700 and daily while on heparin ?Continue to monitor H&H and platelets ? ?Lorelei Pont, PharmD, BCPS ?07/31/2021 10:27 AM ?ED Clinical Pharmacist -  [phone] ? ? ? ?
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
## 2021-07-31 NOTE — ED Notes (Addendum)
Critical Lab: troponin 1,901  ?Dr. Allyson Sabal made aware via secure chat and paged.. Dr. Rosalie Gums responding to text, advised this RN to make Dr Diona Browner aware/. Secure chat sent. ? ?

## 2021-07-31 NOTE — ED Notes (Signed)
Pt's O2 sats 85-90% RA while sleeping, this RN placed pt on 2L [place] for comfort. O2 sats now 99%. ?

## 2021-07-31 NOTE — Progress Notes (Signed)
ANTICOAGULATION CONSULT NOTE - Initial Consult ? ?Pharmacy Consult for Heparin ?Indication: chest pain/ACS ? ?No Known Allergies ? ?Patient Measurements: ?Height: 6' (182.9 cm) ?Weight: 97.8 kg (215 lb 9.8 oz) ?IBW/kg (Calculated) : 77.6 ? ?Vital Signs: ?Temp: 97.8 ?F (36.6 ?C) (04/22 0154) ?BP: 123/86 (04/22 0300) ?Pulse Rate: 83 (04/22 0300) ? ?Labs: ?Recent Labs  ?  07/31/21 ?0204  ?HGB 14.3  ?HCT 45.0  ?PLT 153  ?CREATININE 1.66*  ?TROPONINIHS 46*  ? ? ?Estimated Creatinine Clearance: 55.9 mL/min (A) (by C-G formula based on SCr of 1.66 mg/dL (H)). ? ? ?Medical History: ?Past Medical History:  ?Diagnosis Date  ? Acid reflux   ? Alcohol abuse   ? Arthritis   ? Asthma   ? Back pain   ? Bronchitis   ? CHF (congestive heart failure) ([place])   ? CKD (chronic kidney disease)   ? CKD (chronic kidney disease), stage III ([place])   ? Cocaine abuse ([place])   ? COPD (chronic obstructive pulmonary disease) ([place])   ? History of noncompliance with medical treatment, presenting hazards to health   ? Homelessness   ? Hypertension   ? LV (left ventricular) mural thrombus   ? Myocardial infarction [facility])   ? Neuropathic pain   ? NICM (nonischemic cardiomyopathy) ([place]) 2019  ? NSVT (nonsustained ventricular tachycardia) ([place])   ? Pericardial effusion   ? Pulmonary hypertension ([place])   ? RVF (right ventricular failure) ([place])   ? Schizo affective schizophrenia ([place])   ? ? ?Medications:  ?No current facility-administered medications on file prior to encounter.  ? ?Current Outpatient Medications on File Prior to Encounter  ?Medication Sig Dispense Refill  ? acetaminophen (TYLENOL) 500 MG tablet Take 2 tablets (1,000 mg total) by mouth every 6 (six) hours as needed. (Patient taking differently: Take 1,000 mg by mouth every 6 (six) hours as needed for moderate pain.) 30 tablet 0  ? albuterol (VENTOLIN HFA) 108 (90 Base) MCG/ACT inhaler Inhale 1-2 puffs into the lungs every 6 (six) hours as needed for wheezing or shortness of breath. 108 g 0  ?  carvedilol (COREG) 6.25 MG tablet Take 1 tablet (6.25 mg total) by mouth 2 (two) times daily. 180 tablet 3  ? citalopram (CELEXA) 20 MG tablet Take 1 tablet (20 mg total) by mouth daily. (Patient not taking: Reported on 07/26/2021) 30 tablet 1  ? FLOVENT HFA 110 MCG/ACT inhaler Inhale 2 puffs into the lungs 2 (two) times daily. 1 each 1  ? gabapentin (NEURONTIN) 400 MG capsule Take 1 capsule (400 mg total) by mouth daily as needed (nerve pain). (Patient not taking: Reported on 07/26/2021) 30 capsule 1  ? hydrALAZINE (APRESOLINE) 25 MG tablet Take 1 tablet (25 mg total) by mouth every 8 (eight) hours. ** FOR BLOOD PRESSURE** (Patient taking differently: Take 25 mg by mouth every 8 (eight) hours.) 60 tablet 0  ? isosorbide mononitrate (IMDUR) 30 MG 24 hr tablet Take 0.5 tablets (15 mg total) by mouth daily. (Patient taking differently: Take 30 mg by mouth daily.) 90 tablet 3  ? methocarbamol (ROBAXIN) 500 MG tablet Take 1 tablet (500 mg total) by mouth every 6 (six) hours as needed for muscle spasms. (Patient not taking: Reported on 07/26/2021) 40 tablet 1  ? nitroGLYCERIN (NITROSTAT) 0.4 MG SL tablet Place 1 tablet (0.4 mg total) under the tongue every 5 (five) minutes as needed for chest pain. 25 tablet 1  ? oxyCODONE (OXY IR/ROXICODONE) 5 MG immediate release tablet Take 1 tablet (5 mg  total) by mouth 3 (three) times daily as needed (pain). (Patient not taking: Reported on 07/26/2021) 4 tablet 0  ? oxyCODONE (OXY IR/ROXICODONE) 5 MG immediate release tablet Take 1-2 tablets (5-10 mg total) by mouth every 6 (six) hours as needed. (Patient not taking: Reported on 07/26/2021) 30 tablet 0  ? torsemide (DEMADEX) 20 MG tablet Take 1 tablet (20 mg total) by mouth 2 (two) times daily. 60 tablet 0  ?  ? ?Assessment: ?61 y.o. male with chest pain for heparin  ?Goal of Therapy:  ?Heparin level 0.3-0.7 units/ml ?Monitor platelets by anticoagulation protocol: Yes ?  ?Plan:  ?Heparin 4000 units IV bolus, then start heparin 1200  units/hr ?Check heparin level in 6 hours.  ? ?Leida Luton, Bronson Curb ?07/31/2021,3:05 AM ? ? ?

## 2021-07-31 NOTE — Progress Notes (Signed)
Pt had a 9-beat run of V-tach @ [phone] & a 8-beat run of V-tach @ 1920. Pt resting comfortably with no complaints of pain or shortness of breath. Brayton Layman, MD made aware. Will continue to monitor. ? ?Bari Edward, RN ? ?

## 2021-07-31 NOTE — ED Provider Triage Note (Addendum)
Emergency Medicine Provider Triage Evaluation Note ? ?Terri [place] , a 61 y.o. male  was evaluated in triage.  Pt complains of chest pain.  Smoked crack (couple hits) then went for a walk and started having chest pain.  Reports chest pain and right shoulder pain for the past few days as well.  Hx of CHF, NICM, COPD.  EMS did give ASA and NTG. ? ?Review of Systems  ?Positive: Chest pain ?Negative: fever ? ?Physical Exam  ?BP 122/90   Pulse 72   Temp 97.8 ?F (36.6 ?C)   Resp 20   SpO2 100%  ? ?Gen:   Awake, no distress   ?Resp:  Normal effort  ?MSK:   Moves extremities without difficulty  ?Other:   ? ?Medical Decision Making  ?Medically screening exam initiated at 1:52 AM.  Appropriate orders placed.  Noemi Trigger was informed that the remainder of the evaluation will be completed by another provider, this initial triage assessment does not replace that evaluation, and the importance of remaining in the ED until their evaluation is complete. ? ?Chest pain after smoking crack.  EKG, labs,  CXR.  Reports he has actually been having chest pain for the past few days.   ? ?2:12 AM ?EKG abnormal with new changes today.  Reviewed EKG with Dr. Gwenlyn Found-- would not active code STEMI but does recommend enzymes and have cards fellow evaluate.  Attending MD and charge RN made aware of recommendations.  Moving to acute bed. ?  ?Larene Pickett, PA-C ?07/31/21 0202 ? ?  ?Larene Pickett, PA-C ?07/31/21 0222 ? ?
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
## 2021-07-31 NOTE — ED Notes (Signed)
SL Nitro ordered per MD Lily Peer ?

## 2021-07-31 NOTE — ED Triage Notes (Signed)
The pt [place] by [address]  he smoked crack and started having chest pain  he took  n sl nitro that ems said  wasold   they gave him one  sl  iv per ems ?

## 2021-07-31 NOTE — Progress Notes (Signed)
ANTICOAGULATION CONSULT NOTE - Follow-Up ? ?Pharmacy Consult for Heparin ?Indication: chest pain/ACS ? ?No Known Allergies ? ?Patient Measurements: ?Height: 6' (182.9 cm) ?Weight: 80.3 kg (177 lb) ?IBW/kg (Calculated) : 77.6 ?Heparin Dosing Weight: 97.2 kg ? ?Vital Signs: ?Temp: 97.8 ?F (36.6 ?C) (04/22 1506) ?Temp Source: Oral (04/22 1506) ?BP: 110/77 (04/22 1653) ?Pulse Rate: 65 (04/22 1653) ? ?Labs: ?Recent Labs  ?  07/31/21 ?0204 07/31/21 ?[id number] 07/31/21 ?[id number] 07/31/21 ?1000 07/31/21 ?1508 07/31/21 ?1521  ?HGB 14.3  --   --   --  15.8  --   ?HCT 45.0  --   --   --  48.4  --   ?PLT 153  --   --   --  160  --   ?HEPARINUNFRC  --   --   --  0.39  --  0.30  ?CREATININE 1.66*  --  1.39*  --   --   --   ?TROPONINIHS 46* 159* 1,901*  --   --   --   ? ? ? ?Estimated Creatinine Clearance: 60.5 mL/min (A) (by C-G formula based on SCr of 1.39 mg/dL (H)). ? ? ?Medical History: ?Past Medical History:  ?Diagnosis Date  ? Acid reflux   ? Alcohol abuse   ? Arthritis   ? Asthma   ? Back pain   ? Bronchitis   ? Chest pain 07/31/2021  ? CHF (congestive heart failure) ([place])   ? CKD (chronic kidney disease)   ? CKD (chronic kidney disease), stage III ([place])   ? Cocaine abuse ([place])   ? COPD (chronic obstructive pulmonary disease) ([place])   ? History of noncompliance with medical treatment, presenting hazards to health   ? Homelessness   ? Hypertension   ? LV (left ventricular) mural thrombus   ? Myocardial infarction [facility])   ? Neuropathic pain   ? NICM (nonischemic cardiomyopathy) ([place]) 2019  ? NSTEMI (non-ST elevated myocardial infarction) ([place]) 07/31/2021  ? NSVT (nonsustained ventricular tachycardia) ([place])   ? Pericardial effusion   ? Pulmonary hypertension ([place])   ? RVF (right ventricular failure) ([place])   ? Schizo affective schizophrenia ([place])   ? ? ?Medications:  ?Medications Prior to Admission  ?Medication Sig Dispense Refill Last Dose  ? acetaminophen (TYLENOL) 500 MG tablet Take 2 tablets (1,000 mg total) by mouth every 6 (six)  hours as needed. (Patient taking differently: Take 1,000 mg by mouth every 6 (six) hours as needed for moderate pain.) 30 tablet 0   ? albuterol (VENTOLIN HFA) 108 (90 Base) MCG/ACT inhaler Inhale 1-2 puffs into the lungs every 6 (six) hours as needed for wheezing or shortness of breath. 108 g 0   ? carvedilol (COREG) 6.25 MG tablet Take 1 tablet (6.25 mg total) by mouth 2 (two) times daily. 180 tablet 3   ? citalopram (CELEXA) 20 MG tablet Take 1 tablet (20 mg total) by mouth daily. (Patient not taking: Reported on 07/26/2021) 30 tablet 1   ? FLOVENT HFA 110 MCG/ACT inhaler Inhale 2 puffs into the lungs 2 (two) times daily. 1 each 1   ? gabapentin (NEURONTIN) 400 MG capsule Take 1 capsule (400 mg total) by mouth daily as needed (nerve pain). (Patient not taking: Reported on 07/26/2021) 30 capsule 1   ? hydrALAZINE (APRESOLINE) 25 MG tablet Take 1 tablet (25 mg total) by mouth every 8 (eight) hours. ** FOR BLOOD PRESSURE** (Patient taking differently: Take 25 mg by mouth every 8 (eight) hours.) 60 tablet 0   ? isosorbide  mononitrate (IMDUR) 30 MG 24 hr tablet Take 0.5 tablets (15 mg total) by mouth daily. (Patient taking differently: Take 30 mg by mouth daily.) 90 tablet 3   ? methocarbamol (ROBAXIN) 500 MG tablet Take 1 tablet (500 mg total) by mouth every 6 (six) hours as needed for muscle spasms. (Patient not taking: Reported on 07/26/2021) 40 tablet 1   ? nitroGLYCERIN (NITROSTAT) 0.4 MG SL tablet Place 1 tablet (0.4 mg total) under the tongue every 5 (five) minutes as needed for chest pain. 25 tablet 1   ? oxyCODONE (OXY IR/ROXICODONE) 5 MG immediate release tablet Take 1 tablet (5 mg total) by mouth 3 (three) times daily as needed (pain). (Patient not taking: Reported on 07/26/2021) 4 tablet 0   ? oxyCODONE (OXY IR/ROXICODONE) 5 MG immediate release tablet Take 1-2 tablets (5-10 mg total) by mouth every 6 (six) hours as needed. (Patient not taking: Reported on 07/26/2021) 30 tablet 0   ? torsemide (DEMADEX) 20 MG  tablet Take 1 tablet (20 mg total) by mouth 2 (two) times daily. 60 tablet 0   ? ? ?Scheduled:  ? [START ON 08/01/2021] aspirin EC  81 mg Oral Daily  ? atorvastatin  80 mg Oral Daily  ? budesonide  0.5 mg Inhalation BID  ? carvedilol  6.25 mg Oral BID  ? hydrALAZINE  25 mg Oral Q8H  ? isosorbide mononitrate  15 mg Oral Daily  ? torsemide  20 mg Oral BID  ? ?Infusions:  ? heparin 1,200 Units/hr (07/31/21 1600)  ? ?PRN: acetaminophen, albuterol, nitroGLYCERIN, ondansetron (ZOFRAN) IV ? ?Assessment: ?74 yom with a history of polysubstance abuse, COPD, renal insufficiency, nonischemic cardiomyopathy with EF < 20%,Hx  LV thrombus 2021, schizophrenia. Patient is presenting with chest pain. Heparin per pharmacy consult placed for chest pain/ACS. ? ?Patient is not on anticoagulation prior to arrival. Heparin started at 1200 unit/hr following 4000 unit iv bolus. ? ?Heparin level 0.3 at goal this evening on heparin drip rate 1200 uts/hr. No issues with infusion or bleeding per RN. ? ? ? ?Goal of Therapy:  ?Heparin level 0.3-0.7 units/ml ?Monitor platelets by anticoagulation protocol: Yes ?  ?Plan:  ?Continue heparin infusion at 1200 units/hr ?Daily heparin level and CBC  ?Continue to monitor H&H and platelets ? ? ? ?Bonnita Nasuti Pharm.D. CPP, BCPS ?Clinical Pharmacist ?[phone] ?07/31/2021 5:51 PM  ? ? ? ? ?

## 2021-07-31 NOTE — ED Provider Notes (Signed)
?[facility] EMERGENCY DEPARTMENT ?Provider Note ? ? ?CSN: [id number] ?Arrival date & time: 07/31/21  0150 ? ?  ? ?History ? ?Chief Complaint  ?Patient presents with  ? Chest Pain  ? ? ?Allen Mayer is a 61 y.o. male. ? ?Patient presents to the emergency department for evaluation of chest pain.  Patient reports that the pain began several hours ago.  He does report smoking crack earlier tonight.  Pain began while he was walking.  He took 2 sublingual nitroglycerin without improvement.  Patient comes to the ER by EMS, was given aspirin and 1 additional nitroglycerin.  Patient still experiencing pain in the center of his chest that radiates to the right shoulder area.  He denies shortness of breath. ? ? ?  ? ?Home Medications ?Prior to Admission medications   ?Medication Sig Start Date End Date Taking? Authorizing Provider  ?acetaminophen (TYLENOL) 500 MG tablet Take 2 tablets (1,000 mg total) by mouth every 6 (six) hours as needed. ?Patient taking differently: Take 1,000 mg by mouth every 6 (six) hours as needed for moderate pain. 12/18/20   Barnetta Chapel, PA-C  ?albuterol (VENTOLIN HFA) 108 (90 Base) MCG/ACT inhaler Inhale 1-2 puffs into the lungs every 6 (six) hours as needed for wheezing or shortness of breath. 07/26/21   Al Decant, PA-C  ?carvedilol (COREG) 6.25 MG tablet Take 1 tablet (6.25 mg total) by mouth 2 (two) times daily. 09/18/20   Jodelle Gross, NP  ?citalopram (CELEXA) 20 MG tablet Take 1 tablet (20 mg total) by mouth daily. ?Patient not taking: Reported on 07/26/2021 05/22/20   Jodelle Gross, NP  ?FLOVENT HFA 110 MCG/ACT inhaler Inhale 2 puffs into the lungs 2 (two) times daily. 12/04/19   Rollene Rotunda, MD  ?gabapentin (NEURONTIN) 400 MG capsule Take 1 capsule (400 mg total) by mouth daily as needed (nerve pain). ?Patient not taking: Reported on 07/26/2021 05/22/20   Jodelle Gross, NP  ?hydrALAZINE (APRESOLINE) 25 MG tablet Take 1 tablet (25 mg total) by  mouth every 8 (eight) hours. ** FOR BLOOD PRESSURE** ?Patient taking differently: Take 25 mg by mouth every 8 (eight) hours. 07/12/21   Rollene Rotunda, MD  ?isosorbide mononitrate (IMDUR) 30 MG 24 hr tablet Take 0.5 tablets (15 mg total) by mouth daily. ?Patient taking differently: Take 30 mg by mouth daily. 05/22/20 07/26/21  Jodelle Gross, NP  ?methocarbamol (ROBAXIN) 500 MG tablet Take 1 tablet (500 mg total) by mouth every 6 (six) hours as needed for muscle spasms. ?Patient not taking: Reported on 07/26/2021 12/18/20   Barnetta Chapel, PA-C  ?nitroGLYCERIN (NITROSTAT) 0.4 MG SL tablet Place 1 tablet (0.4 mg total) under the tongue every 5 (five) minutes as needed for chest pain. 01/11/21   Rollene Rotunda, MD  ?oxyCODONE (OXY IR/ROXICODONE) 5 MG immediate release tablet Take 1 tablet (5 mg total) by mouth 3 (three) times daily as needed (pain). ?Patient not taking: Reported on 07/26/2021 11/02/19   Dimple Nanas, MD  ?oxyCODONE (OXY IR/ROXICODONE) 5 MG immediate release tablet Take 1-2 tablets (5-10 mg total) by mouth every 6 (six) hours as needed. ?Patient not taking: Reported on 07/26/2021 12/18/20   Barnetta Chapel, PA-C  ?torsemide (DEMADEX) 20 MG tablet Take 1 tablet (20 mg total) by mouth 2 (two) times daily. 07/26/21   Al Decant, PA-C  ?   ? ?Allergies    ?Patient has no known allergies.   ? ?Review of Systems   ?Review of Systems  ?Cardiovascular:  Positive for chest pain.  ? ?Physical Exam ?Updated Vital Signs ?BP (!) 147/108   Pulse 80   Temp 97.8 ?F (36.6 ?C)   Resp (!) 23   Ht 6' (1.829 m)   Wt 97.8 kg   SpO2 96%   BMI 29.24 kg/m?  ?Physical Exam ?Vitals and nursing note reviewed.  ?Constitutional:   ?   General: He is not in acute distress. ?   Appearance: He is well-developed.  ?HENT:  ?   Head: Normocephalic and atraumatic.  ?   Mouth/Throat:  ?   Mouth: Mucous membranes are moist.  ?Eyes:  ?   General: Vision grossly intact. Gaze aligned appropriately.  ?   Extraocular Movements:  Extraocular movements intact.  ?   Conjunctiva/sclera: Conjunctivae normal.  ?Cardiovascular:  ?   Rate and Rhythm: Normal rate and regular rhythm.  ?   Pulses: Normal pulses.  ?   Heart sounds: Normal heart sounds, S1 normal and S2 normal. No murmur heard. ?  No friction rub. No gallop.  ?Pulmonary:  ?   Effort: Pulmonary effort is normal. No respiratory distress.  ?   Breath sounds: Normal breath sounds.  ?Abdominal:  ?   Palpations: Abdomen is soft.  ?   Tenderness: There is no abdominal tenderness. There is no guarding or rebound.  ?   Hernia: No hernia is present.  ?Musculoskeletal:     ?   General: No swelling.  ?   Cervical back: Full passive range of motion without pain, normal range of motion and neck supple. No pain with movement, spinous process tenderness or muscular tenderness. Normal range of motion.  ?   Right lower leg: No edema.  ?   Left lower leg: No edema.  ?Skin: ?   General: Skin is warm and dry.  ?   Capillary Refill: Capillary refill takes less than 2 seconds.  ?   Findings: No ecchymosis, erythema, lesion or wound.  ?Neurological:  ?   Mental Status: He is alert and oriented to person, place, and time.  ?   GCS: GCS eye subscore is 4. GCS verbal subscore is 5. GCS motor subscore is 6.  ?   Cranial Nerves: Cranial nerves 2-12 are intact.  ?   Sensory: Sensation is intact.  ?   Motor: Motor function is intact. No weakness or abnormal muscle tone.  ?   Coordination: Coordination is intact.  ?Psychiatric:     ?   Mood and Affect: Mood normal.     ?   Speech: Speech normal.     ?   Behavior: Behavior normal.  ? ? ?ED Results / Procedures / Treatments   ?Labs ?(all labs ordered are listed, but only abnormal results are displayed) ?Labs Reviewed  ?CBC WITH DIFFERENTIAL/PLATELET - Abnormal; Notable for the following components:  ?    Result Value  ? RDW 15.6 (*)   ? All other components within normal limits  ?BASIC METABOLIC PANEL - Abnormal; Notable for the following components:  ? Glucose, Bld 116  (*)   ? BUN 26 (*)   ? Creatinine, Ser 1.66 (*)   ? Calcium 8.5 (*)   ? GFR, Estimated 46 (*)   ? All other components within normal limits  ?BRAIN NATRIURETIC PEPTIDE - Abnormal; Notable for the following components:  ? B Natriuretic Peptide 2,031.6 (*)   ? All other components within normal limits  ?TROPONIN I (HIGH SENSITIVITY) - Abnormal; Notable for the following components:  ?  Troponin I (High Sensitivity) 46 (*)   ? All other components within normal limits  ?HEPARIN LEVEL (UNFRACTIONATED)  ?BASIC METABOLIC PANEL  ?CBC  ?LIPID PANEL  ?TROPONIN I (HIGH SENSITIVITY)  ? ? ?EKG ?EKG Interpretation ? ?Date/Time:  Saturday July 31 2021 02:56:54 EDT ?Ventricular Rate:  86 ?PR Interval:  229 ?QRS Duration: 104 ?QT Interval:  414 ?QTC Calculation: 496 ?R Axis:   -74 ?Text Interpretation: Sinus rhythm Prolonged PR interval Probable left atrial enlargement LVH with secondary repolarization abnormality Nonspecific ST elevation new since prior EKG Confirmed by Gilda Crease [phone]) on 07/31/2021 3:43:56 AM ? ?Radiology ?DG Chest 2 View ? ?Result Date: 07/31/2021 ?CLINICAL DATA:  Chest pain, smoker crack EXAM: CHEST - 2 VIEW COMPARISON:  07/26/2021 FINDINGS: Low lung volumes. No frank interstitial edema. No pleural effusion or pneumothorax. The heart is top-normal in size. IMPRESSION: Heart is top-normal in size.  No frank interstitial edema. Electronically Signed   By: Charline Bills M.D.   On: 07/31/2021 04:03   ? ?Procedures ?Procedures  ? ? ?Medications Ordered in ED ?Medications  ?heparin ADULT infusion 100 units/mL (25000 units/224mL) (1,200 Units/hr Intravenous New Bag/Given 07/31/21 0319)  ?nitroGLYCERIN (NITROSTAT) SL tablet 0.4 mg (0.4 mg Sublingual Given 07/31/21 0332)  ?aspirin EC tablet 81 mg (has no administration in time range)  ?nitroGLYCERIN (NITROSTAT) SL tablet 0.4 mg (has no administration in time range)  ?acetaminophen (TYLENOL) tablet 650 mg (has no administration in time range)  ?ondansetron  (ZOFRAN) injection 4 mg (has no administration in time range)  ?atorvastatin (LIPITOR) tablet 80 mg (has no administration in time range)  ?acetaminophen (TYLENOL) tablet 650 mg (has no administration in time

## 2021-08-01 ENCOUNTER — Inpatient Hospital Stay (HOSPITAL_COMMUNITY): Payer: Medicaid Other

## 2021-08-01 DIAGNOSIS — R079 Chest pain, unspecified: Secondary | ICD-10-CM

## 2021-08-01 LAB — CBC
HCT: 44.9 % (ref 39.0–52.0)
Hemoglobin: 14.6 g/dL (ref 13.0–17.0)
MCH: 29.3 pg (ref 26.0–34.0)
MCHC: 32.5 g/dL (ref 30.0–36.0)
MCV: 90 fL (ref 80.0–100.0)
Platelets: 159 10*3/uL (ref 150–400)
RBC: 4.99 MIL/uL (ref 4.22–5.81)
RDW: 15.4 % (ref 11.5–15.5)
WBC: 5.5 10*3/uL (ref 4.0–10.5)
nRBC: 0 % (ref 0.0–0.2)

## 2021-08-01 LAB — ECHOCARDIOGRAM COMPLETE
Area-P 1/2: 7.37 cm2
Calc EF: 33.5 %
Height: 72 in
S' Lateral: 6.2 cm
Single Plane A2C EF: 36.7 %
Single Plane A4C EF: 28.9 %
Weight: 2832 oz

## 2021-08-01 LAB — MAGNESIUM: Magnesium: 1.9 mg/dL (ref 1.7–2.4)

## 2021-08-01 LAB — HEPARIN LEVEL (UNFRACTIONATED): Heparin Unfractionated: 0.25 IU/mL — ABNORMAL LOW (ref 0.30–0.70)

## 2021-08-01 LAB — GLUCOSE, CAPILLARY: Glucose-Capillary: 111 mg/dL — ABNORMAL HIGH (ref 70–99)

## 2021-08-01 NOTE — Progress Notes (Signed)
ANTICOAGULATION CONSULT NOTE  ?Pharmacy Consult for Heparin ?Indication: chest pain/ACS ?Brief A/P: Heparin level subtherapeutic Increase Heparin rate ? ?No Known Allergies ? ?Patient Measurements: ?Height: 6' (182.9 cm) ?Weight: 80.3 kg (177 lb) ?IBW/kg (Calculated) : 77.6 ? ?Vital Signs: ?Temp: 98 ?F (36.7 ?C) (04/22 2046) ?Temp Source: Oral (04/22 2046) ?BP: 118/77 (04/22 2046) ?Pulse Rate: 70 (04/22 2046) ? ?Labs: ?Recent Labs  ?  07/31/21 ?0204 07/31/21 ?6270 07/31/21 ?3500 07/31/21 ?1000 07/31/21 ?1508 07/31/21 ?1521 08/01/21 ?0203  ?HGB 14.3  --   --   --  15.8  --  14.6  ?HCT 45.0  --   --   --  48.4  --  44.9  ?PLT 153  --   --   --  160  --  159  ?HEPARINUNFRC  --   --   --  0.39  --  0.30 0.25*  ?CREATININE 1.66*  --  1.39*  --   --   --   --   ?TROPONINIHS 46* 159* 1,901*  --   --   --   --   ? ? ? ?Estimated Creatinine Clearance: 60.5 mL/min (A) (by C-G formula based on SCr of 1.39 mg/dL (H)). ? ?Assessment: ?61 y.o. male with chest pain for heparin  ?Goal of Therapy:  ?Heparin level 0.3-0.7 units/ml ?Monitor platelets by anticoagulation protocol: Yes ?  ?Plan:  ?Increase Heparin 1300 units/hr ? ?Kenzlee Fishburn, Gary Fleet ?08/01/2021,3:34 AM ? ? ?

## 2021-08-01 NOTE — Progress Notes (Signed)
?  Echocardiogram ?2D Echocardiogram has been performed. ? Allen Mayer ?08/01/2021, 1:37 PM ?

## 2021-08-01 NOTE — Progress Notes (Signed)
? ?Progress Note ? ?Patient Name: Allen Mayer ?Date of Encounter: 08/01/2021 ? ?Primary Cardiologist: Minus Breeding, MD  ? ?Subjective  ? ?No chest pain or sob. Feels better. ? ?Inpatient Medications  ?  ?Scheduled Meds: ? aspirin EC  81 mg Oral Daily  ? atorvastatin  80 mg Oral Daily  ? budesonide  0.5 mg Inhalation BID  ? carvedilol  6.25 mg Oral BID  ? hydrALAZINE  25 mg Oral Q8H  ? isosorbide mononitrate  15 mg Oral Daily  ? torsemide  20 mg Oral BID  ? ?Continuous Infusions: ? heparin 1,300 Units/hr (08/01/21 0354)  ? ?PRN Meds: ?acetaminophen, albuterol, nitroGLYCERIN, ondansetron (ZOFRAN) IV  ? ?Vital Signs  ?  ?Vitals:  ? 07/31/21 2110 08/01/21 0353 08/01/21 [id number] 08/01/21 0747  ?BP:  129/83 (!) 135/97   ?Pulse:  80  76  ?Resp:  18  16  ?Temp:  98.2 ?F (36.8 ?C)    ?TempSrc:  Oral    ?SpO2: 96% 97%    ?Weight:      ?Height:      ? ? ?Intake/Output Summary (Last 24 hours) at 08/01/2021 0909 ?Last data filed at 07/31/2021 2047 ?Gross per 24 hour  ?Intake 671.67 ml  ?Output 5320 ml  ?Net -4648.33 ml  ? ?Filed Weights  ? 07/31/21 0215 07/31/21 1506  ?Weight: 97.8 kg 80.3 kg  ? ? ?Telemetry  ?  ?nsr - Personally Reviewed ? ?ECG  ?  ?nsr - Personally Reviewed ? ?Physical Exam  ? ?GEN: No acute distress.   ?Neck: No JVD ?Cardiac: RRR, no murmurs, rubs, or gallops.  ?Respiratory: Clear to auscultation bilaterally. ?GI: Soft, nontender, non-distended  ?MS: No edema; No deformity. ?Neuro:  Nonfocal  ?Psych: Normal affect  ? ?Labs  ?  ?Chemistry ?Recent Labs  ?Lab 07/26/21 ?0634 07/31/21 ?0204 07/31/21 ?0810  ?NA 137 136 136  ?K 4.5 4.3 4.3  ?CL 106 103 103  ?CO2 21* 27 26  ?GLUCOSE 106* 116* 107*  ?BUN 26* 26* 24*  ?CREATININE 1.48* 1.66* 1.39*  ?CALCIUM 9.2 8.5* 8.5*  ?PROT 6.6  --   --   ?ALBUMIN 3.4*  --   --   ?AST 35  --   --   ?ALT 35  --   --   ?ALKPHOS 77  --   --   ?BILITOT 1.8*  --   --   ?GFRNONAA 53* 46* 57*  ?ANIONGAP 10 6 7   ?  ? ?Hematology ?Recent Labs  ?Lab 07/31/21 ?0204 07/31/21 ?1508  08/01/21 ?0203  ?WBC 6.5 5.9 5.5  ?RBC 4.92 5.38 4.99  ?HGB 14.3 15.8 14.6  ?HCT 45.0 48.4 44.9  ?MCV 91.5 90.0 90.0  ?MCH 29.1 29.4 29.3  ?MCHC 31.8 32.6 32.5  ?RDW 15.6* 15.6* 15.4  ?PLT 153 160 159  ? ? ?Cardiac EnzymesNo results for input(s): TROPONINI in the last 168 hours. No results for input(s): TROPIPOC in the last 168 hours.  ? ?BNP ?Recent Labs  ?Lab 07/26/21 ?[id number] 07/31/21 ?0204  ?BNP 2,369.2* 2,031.6*  ?  ? ?DDimer No results for input(s): DDIMER in the last 168 hours.  ? ?Radiology  ?  ?DG Chest 2 View ? ?Result Date: 07/31/2021 ?CLINICAL DATA:  Chest pain, smoker crack EXAM: CHEST - 2 VIEW COMPARISON:  07/26/2021 FINDINGS: Low lung volumes. No frank interstitial edema. No pleural effusion or pneumothorax. The heart is top-normal in size. IMPRESSION: Heart is top-normal in size.  No frank interstitial edema. Electronically Signed   By: Bertis Ruddy  [place] Pink M.D.   On: 07/31/2021 04:03   ? ?Cardiac Studies  ? ?2D echo is pending ? ?Patient Profile  ?   ?61 y.o. male admitted with NSTEMI ? ?Assessment & Plan  ?  ?NSTEMI - he has had resolution of chest pain. Elevated troponin noted. For left heart cath tomorrow. Continue IV heparin ?Acute on chronic renal insuff. - stage 3 - his renal function appears to be stable. We will follow.  ? ?For questions or updates, please contact [place] ?Please consult [email or web address] for contact info under Cardiology/STEMI. ?  ?   ?Signed, ?Cristopher Peru, MD  ?08/01/2021, 9:09 AM    ?

## 2021-08-01 NOTE — H&P (View-Only) (Signed)
? ?Progress Note ? ?Patient Name: Allen Mayer ?Date of Encounter: 08/01/2021 ? ?Primary Cardiologist: Minus Breeding, MD  ? ?Subjective  ? ?No chest pain or sob. Feels better. ? ?Inpatient Medications  ?  ?Scheduled Meds: ? aspirin EC  81 mg Oral Daily  ? atorvastatin  80 mg Oral Daily  ? budesonide  0.5 mg Inhalation BID  ? carvedilol  6.25 mg Oral BID  ? hydrALAZINE  25 mg Oral Q8H  ? isosorbide mononitrate  15 mg Oral Daily  ? torsemide  20 mg Oral BID  ? ?Continuous Infusions: ? heparin 1,300 Units/hr (08/01/21 0354)  ? ?PRN Meds: ?acetaminophen, albuterol, nitroGLYCERIN, ondansetron (ZOFRAN) IV  ? ?Vital Signs  ?  ?Vitals:  ? 07/31/21 2110 08/01/21 0353 08/01/21 [id number] 08/01/21 0747  ?BP:  129/83 (!) 135/97   ?Pulse:  80  76  ?Resp:  18  16  ?Temp:  98.2 ?F (36.8 ?C)    ?TempSrc:  Oral    ?SpO2: 96% 97%    ?Weight:      ?Height:      ? ? ?Intake/Output Summary (Last 24 hours) at 08/01/2021 0909 ?Last data filed at 07/31/2021 2047 ?Gross per 24 hour  ?Intake 671.67 ml  ?Output 5320 ml  ?Net -4648.33 ml  ? ?Filed Weights  ? 07/31/21 0215 07/31/21 1506  ?Weight: 97.8 kg 80.3 kg  ? ? ?Telemetry  ?  ?nsr - Personally Reviewed ? ?ECG  ?  ?nsr - Personally Reviewed ? ?Physical Exam  ? ?GEN: No acute distress.   ?Neck: No JVD ?Cardiac: RRR, no murmurs, rubs, or gallops.  ?Respiratory: Clear to auscultation bilaterally. ?GI: Soft, nontender, non-distended  ?MS: No edema; No deformity. ?Neuro:  Nonfocal  ?Psych: Normal affect  ? ?Labs  ?  ?Chemistry ?Recent Labs  ?Lab 07/26/21 ?0634 07/31/21 ?0204 07/31/21 ?0810  ?NA 137 136 136  ?K 4.5 4.3 4.3  ?CL 106 103 103  ?CO2 21* 27 26  ?GLUCOSE 106* 116* 107*  ?BUN 26* 26* 24*  ?CREATININE 1.48* 1.66* 1.39*  ?CALCIUM 9.2 8.5* 8.5*  ?PROT 6.6  --   --   ?ALBUMIN 3.4*  --   --   ?AST 35  --   --   ?ALT 35  --   --   ?ALKPHOS 77  --   --   ?BILITOT 1.8*  --   --   ?GFRNONAA 53* 46* 57*  ?ANIONGAP 10 6 7   ?  ? ?Hematology ?Recent Labs  ?Lab 07/31/21 ?0204 07/31/21 ?1508  08/01/21 ?0203  ?WBC 6.5 5.9 5.5  ?RBC 4.92 5.38 4.99  ?HGB 14.3 15.8 14.6  ?HCT 45.0 48.4 44.9  ?MCV 91.5 90.0 90.0  ?MCH 29.1 29.4 29.3  ?MCHC 31.8 32.6 32.5  ?RDW 15.6* 15.6* 15.4  ?PLT 153 160 159  ? ? ?Cardiac EnzymesNo results for input(s): TROPONINI in the last 168 hours. No results for input(s): TROPIPOC in the last 168 hours.  ? ?BNP ?Recent Labs  ?Lab 07/26/21 ?[id number] 07/31/21 ?0204  ?BNP 2,369.2* 2,031.6*  ?  ? ?DDimer No results for input(s): DDIMER in the last 168 hours.  ? ?Radiology  ?  ?DG Chest 2 View ? ?Result Date: 07/31/2021 ?CLINICAL DATA:  Chest pain, smoker crack EXAM: CHEST - 2 VIEW COMPARISON:  07/26/2021 FINDINGS: Low lung volumes. No frank interstitial edema. No pleural effusion or pneumothorax. The heart is top-normal in size. IMPRESSION: Heart is top-normal in size.  No frank interstitial edema. Electronically Signed   By: Bertis Ruddy  [place] Pink M.D.   On: 07/31/2021 04:03   ? ?Cardiac Studies  ? ?2D echo is pending ? ?Patient Profile  ?   ?61 y.o. male admitted with NSTEMI ? ?Assessment & Plan  ?  ?NSTEMI - he has had resolution of chest pain. Elevated troponin noted. For left heart cath tomorrow. Continue IV heparin ?Acute on chronic renal insuff. - stage 3 - his renal function appears to be stable. We will follow.  ? ?For questions or updates, please contact [place] ?Please consult [email or web address] for contact info under Cardiology/STEMI. ?  ?   ?Signed, ?Cristopher Peru, MD  ?08/01/2021, 9:09 AM    ?

## 2021-08-02 ENCOUNTER — Other Ambulatory Visit (HOSPITAL_COMMUNITY): Payer: Self-pay

## 2021-08-02 ENCOUNTER — Encounter (HOSPITAL_COMMUNITY)
Admission: EM | Disposition: A | Discharge status: Home / Self Care | Payer: Self-pay | Source: Home / Self Care | Attending: Cardiovascular Disease

## 2021-08-02 ENCOUNTER — Encounter (HOSPITAL_COMMUNITY): Payer: Self-pay | Admitting: Internal Medicine

## 2021-08-02 DIAGNOSIS — I2510 Atherosclerotic heart disease of native coronary artery without angina pectoris: Secondary | ICD-10-CM

## 2021-08-02 DIAGNOSIS — I5023 Acute on chronic systolic (congestive) heart failure: Secondary | ICD-10-CM

## 2021-08-02 DIAGNOSIS — I2129 ST elevation (STEMI) myocardial infarction involving other sites: Secondary | ICD-10-CM

## 2021-08-02 HISTORY — PX: RIGHT HEART CATH AND CORONARY ANGIOGRAPHY: CATH118264

## 2021-08-02 LAB — POCT I-STAT EG7
Acid-Base Excess: 5 mmol/L — ABNORMAL HIGH (ref 0.0–2.0)
Bicarbonate: 31.4 mmol/L — ABNORMAL HIGH (ref 20.0–28.0)
Calcium, Ion: 1.19 mmol/L (ref 1.15–1.40)
HCT: 49 % (ref 39.0–52.0)
Hemoglobin: 16.7 g/dL (ref 13.0–17.0)
O2 Saturation: 59 %
Potassium: 4 mmol/L (ref 3.5–5.1)
Sodium: 137 mmol/L (ref 135–145)
TCO2: 33 mmol/L — ABNORMAL HIGH (ref 22–32)
pCO2, Ven: 52.8 mmHg (ref 44–60)
pH, Ven: 7.382 (ref 7.25–7.43)
pO2, Ven: 32 mmHg (ref 32–45)

## 2021-08-02 LAB — BASIC METABOLIC PANEL
Anion gap: 8 (ref 5–15)
BUN: 19 mg/dL (ref 8–23)
CO2: 27 mmol/L (ref 22–32)
Calcium: 9.1 mg/dL (ref 8.9–10.3)
Chloride: 100 mmol/L (ref 98–111)
Creatinine, Ser: 1.4 mg/dL — ABNORMAL HIGH (ref 0.61–1.24)
GFR, Estimated: 57 mL/min — ABNORMAL LOW (ref >60)
Glucose, Bld: 99 mg/dL (ref 70–99)
Potassium: 4.2 mmol/L (ref 3.5–5.1)
Sodium: 135 mmol/L (ref 135–145)

## 2021-08-02 LAB — POCT I-STAT 7, (LYTES, BLD GAS, ICA,H+H)
Acid-Base Excess: 4 mmol/L — ABNORMAL HIGH (ref 0.0–2.0)
Bicarbonate: 28.5 mmol/L — ABNORMAL HIGH (ref 20.0–28.0)
Calcium, Ion: 1.19 mmol/L (ref 1.15–1.40)
HCT: 50 % (ref 39.0–52.0)
Hemoglobin: 17 g/dL (ref 13.0–17.0)
O2 Saturation: 95 %
Potassium: 4.1 mmol/L (ref 3.5–5.1)
Sodium: 136 mmol/L (ref 135–145)
TCO2: 30 mmol/L (ref 22–32)
pCO2 arterial: 39.6 mmHg (ref 32–48)
pH, Arterial: 7.465 — ABNORMAL HIGH (ref 7.35–7.45)
pO2, Arterial: 70 mmHg — ABNORMAL LOW (ref 83–108)

## 2021-08-02 LAB — CBC
HCT: 49.9 % (ref 39.0–52.0)
Hemoglobin: 16 g/dL (ref 13.0–17.0)
MCH: 28.6 pg (ref 26.0–34.0)
MCHC: 32.1 g/dL (ref 30.0–36.0)
MCV: 89.3 fL (ref 80.0–100.0)
Platelets: 167 10*3/uL (ref 150–400)
RBC: 5.59 MIL/uL (ref 4.22–5.81)
RDW: 15.2 % (ref 11.5–15.5)
WBC: 6.3 10*3/uL (ref 4.0–10.5)
nRBC: 0 % (ref 0.0–0.2)

## 2021-08-02 LAB — HEPARIN LEVEL (UNFRACTIONATED)
Heparin Unfractionated: 0.31 IU/mL (ref 0.30–0.70)
Heparin Unfractionated: 0.21 IU/mL — ABNORMAL LOW (ref 0.30–0.70)

## 2021-08-02 LAB — POCT ACTIVATED CLOTTING TIME: Activated Clotting Time: 173 seconds

## 2021-08-02 SURGERY — RIGHT HEART CATH AND CORONARY ANGIOGRAPHY
Anesthesia: LOCAL

## 2021-08-02 MED ORDER — HEPARIN SODIUM (PORCINE) 1000 UNIT/ML IJ SOLN
INTRAMUSCULAR | Status: DC | PRN
  Administered 2021-08-02: 4000 [IU] via INTRAVENOUS

## 2021-08-02 MED ORDER — SODIUM CHLORIDE 0.9 % IV SOLN: INTRAVENOUS | Status: DC

## 2021-08-02 MED ORDER — HEPARIN SODIUM (PORCINE) 1000 UNIT/ML IJ SOLN
INTRAMUSCULAR | Status: AC
Start: 2021-08-02 — End: ?
  Filled 2021-08-02: qty 10

## 2021-08-02 MED ORDER — ASPIRIN 81 MG PO CHEW
81.0000 mg | CHEWABLE_TABLET | ORAL | Status: AC
  Administered 2021-08-02: 81 mg via ORAL
  Filled 2021-08-02: qty 1

## 2021-08-02 MED ORDER — LIDOCAINE HCL (PF) 1 % IJ SOLN
INTRAMUSCULAR | Status: AC
  Filled 2021-08-02: qty 30

## 2021-08-02 MED ORDER — HEPARIN (PORCINE) IN NACL 1000-0.9 UT/500ML-% IV SOLN
INTRAVENOUS | Status: DC | PRN
Start: 2021-08-02 — End: 2021-08-02
  Administered 2021-08-02 (×2): 500 mL

## 2021-08-02 MED ORDER — VERAPAMIL HCL 2.5 MG/ML IV SOLN
INTRAVENOUS | Status: DC | PRN
  Administered 2021-08-02: 10 mL via INTRA_ARTERIAL

## 2021-08-02 MED ORDER — SODIUM CHLORIDE 0.9% FLUSH
3.0000 mL | Freq: Two times a day (BID) | INTRAVENOUS | Status: DC
  Administered 2021-08-02 – 2021-08-03 (×2): 3 mL via INTRAVENOUS

## 2021-08-02 MED ORDER — HEPARIN (PORCINE) IN NACL 1000-0.9 UT/500ML-% IV SOLN
INTRAVENOUS | Status: AC
Start: 2021-08-02 — End: ?
  Filled 2021-08-02: qty 1000

## 2021-08-02 MED ORDER — HYDRALAZINE HCL 20 MG/ML IJ SOLN: 10.0000 mg | INTRAMUSCULAR | Status: AC | PRN

## 2021-08-02 MED ORDER — VERAPAMIL HCL 2.5 MG/ML IV SOLN
INTRAVENOUS | Status: AC
  Filled 2021-08-02: qty 2

## 2021-08-02 MED ORDER — SODIUM CHLORIDE 0.9 % IV SOLN: 250.0000 mL | INTRAVENOUS | Status: DC | PRN

## 2021-08-02 MED ORDER — LIDOCAINE HCL (PF) 1 % IJ SOLN
INTRAMUSCULAR | Status: DC | PRN
  Administered 2021-08-02: 15 mL via INTRADERMAL
  Administered 2021-08-02 (×2): 2 mL via INTRADERMAL

## 2021-08-02 MED ORDER — IOHEXOL 350 MG/ML SOLN
INTRAVENOUS | Status: DC | PRN
Start: 2021-08-02 — End: 2021-08-02
  Administered 2021-08-02: 40 mL

## 2021-08-02 MED ORDER — SODIUM CHLORIDE 0.9 % IV SOLN
INTRAVENOUS | Status: AC | PRN
  Administered 2021-08-02: 250 mL via INTRAVENOUS

## 2021-08-02 MED ORDER — SODIUM CHLORIDE 0.9% FLUSH
3.0000 mL | INTRAVENOUS | Status: DC | PRN
  Administered 2021-08-02: 3 mL via INTRAVENOUS

## 2021-08-02 MED ORDER — HEPARIN (PORCINE) 25000 UT/250ML-% IV SOLN
1450.0000 [IU]/h | INTRAVENOUS | Status: DC
  Administered 2021-08-02: 1300 [IU]/h via INTRAVENOUS
  Filled 2021-08-02: qty 250

## 2021-08-02 MED ORDER — SODIUM CHLORIDE 0.9% FLUSH: 3.0000 mL | Freq: Two times a day (BID) | INTRAVENOUS | Status: DC

## 2021-08-02 MED ORDER — SODIUM CHLORIDE 0.9% FLUSH: 3.0000 mL | INTRAVENOUS | Status: DC | PRN

## 2021-08-02 SURGICAL SUPPLY — 14 items
CATH 5FR JL3.5 JR4 ANG PIG MP (CATHETERS) ×1 IMPLANT
CATH BALLN WEDGE 5F 110CM (CATHETERS) ×1 IMPLANT
DEVICE RAD COMP TR BAND LRG (VASCULAR PRODUCTS) ×1 IMPLANT
GLIDESHEATH SLEND SS 6F .021 (SHEATH) ×1 IMPLANT
GUIDEWIRE .025 260CM (WIRE) ×1 IMPLANT
GUIDEWIRE INQWIRE 1.5J.035X260 (WIRE) IMPLANT
INQWIRE 1.5J .035X260CM (WIRE) ×4
KIT HEART LEFT (KITS) ×2 IMPLANT
PACK CARDIAC CATHETERIZATION (CUSTOM PROCEDURE TRAY) ×2 IMPLANT
SHEATH GLIDE SLENDER 4/5FR (SHEATH) ×1 IMPLANT
SHEATH PINNACLE 5F 10CM (SHEATH) ×1 IMPLANT
TRANSDUCER W/STOPCOCK (MISCELLANEOUS) ×2 IMPLANT
TUBING CIL FLEX 10 FLL-RA (TUBING) ×2 IMPLANT
WIRE EMERALD 3MM-J .025X260CM (WIRE) ×1 IMPLANT

## 2021-08-02 NOTE — Progress Notes (Signed)
? ?Progress Note ? ?Patient Name: Allen Mayer ?Date of Encounter: 08/02/2021 ? ?Primary Cardiologist: Minus Breeding, MD  ? ?Subjective  ? ?Willing to go to a shelter after d/c ?Says this has made him not want to smoke, says will not do drugs ? ?Inpatient Medications  ?  ?Scheduled Meds: ? aspirin EC  81 mg Oral Daily  ? atorvastatin  80 mg Oral Daily  ? budesonide  0.5 mg Inhalation BID  ? carvedilol  6.25 mg Oral BID  ? hydrALAZINE  25 mg Oral Q8H  ? isosorbide mononitrate  15 mg Oral Daily  ? sodium chloride flush  3 mL Intravenous Q12H  ? torsemide  20 mg Oral BID  ? ?Continuous Infusions: ? sodium chloride    ? sodium chloride    ? heparin 1,300 Units/hr (08/02/21 0352)  ? ?PRN Meds: ?sodium chloride, acetaminophen, albuterol, nitroGLYCERIN, ondansetron (ZOFRAN) IV, sodium chloride flush  ? ?Vital Signs  ?  ?Vitals:  ? 08/01/21 1151 08/01/21 1305 08/01/21 2027 08/02/21 0538  ?BP: 104/68 104/64 107/81 (!) 123/91  ?Pulse: 63  72 69  ?Resp: 15  16 16   ?Temp: 97.7 ?F (36.5 ?C)  97.9 ?F (36.6 ?C) 98.4 ?F (36.9 ?C)  ?TempSrc: Oral  Oral Oral  ?SpO2: 94%  97% 95%  ?Weight:      ?Height:      ? ? ?Intake/Output Summary (Last 24 hours) at 08/02/2021 0713 ?Last data filed at 08/02/2021 0542 ?Gross per 24 hour  ?Intake 1051.49 ml  ?Output 3900 ml  ?Net -2848.51 ml  ? ?Filed Weights  ? 07/31/21 0215 07/31/21 1506  ?Weight: 97.8 kg 80.3 kg  ? ? ?Telemetry  ?  ?SR, PVCs, pairs, 3 bt run NSVT - Personally Reviewed ? ?ECG  ?  ?None today - Personally Reviewed ? ?Physical Exam  ? ?General: Well developed, well nourished, male in no acute distress ?Head: Eyes PERRLA, Head normocephalic and atraumatic ?Lungs: clear bilaterally to auscultation. ?Heart: HRRR S1 S2, without rub or gallop. No murmur. 4/4 extremity pulses are 2+ & equal. No JVD. ?Abdomen: Bowel sounds are present, abdomen soft and non-tender without masses or  hernias noted. ?Msk: Normal strength and tone for age. ?Extremities: No clubbing, cyanosis or edema.     ?Skin:  No rashes or lesions noted. ?Neuro: Alert and oriented X 3. ?Psych:  Good affect, responds appropriately ? ?Labs  ?  ?Chemistry ?Recent Labs  ?Lab 07/31/21 ?0204 07/31/21 ?[id number] 08/02/21 ?0430  ?NA 136 136 135  ?K 4.3 4.3 4.2  ?CL 103 103 100  ?CO2 27 26 27   ?GLUCOSE 116* 107* 99  ?BUN 26* 24* 19  ?CREATININE 1.66* 1.39* 1.40*  ?CALCIUM 8.5* 8.5* 9.1  ?GFRNONAA 46* 57* 57*  ?ANIONGAP 6 7 8   ?  ? ?Hematology ?Recent Labs  ?Lab 07/31/21 ?1508 08/01/21 ?0203 08/02/21 ?0430  ?WBC 5.9 5.5 6.3  ?RBC 5.38 4.99 5.59  ?HGB 15.8 14.6 16.0  ?HCT 48.4 44.9 49.9  ?MCV 90.0 90.0 89.3  ?MCH 29.4 29.3 28.6  ?MCHC 32.6 32.5 32.1  ?RDW 15.6* 15.4 15.2  ?PLT 160 159 167  ? ? ?Cardiac Enzymes High Sensitivity Troponin:   ?Recent Labs  ?Lab 07/26/21 ?0634 07/26/21 ?1818 07/31/21 ?0204 07/31/21 ?[id number] 07/31/21 ?0810  ?TROPONINIHS 40* 28* 46* 159* 1,901*  ?   ?  ? ?BNP ?Recent Labs  ?Lab 07/31/21 ?0204  ?BNP 2,031.6*  ?  ?Drugs of Abuse  ?   ?Component Value Date/Time  ? LABOPIA POSITIVE (A) 07/31/2021 1147  ?  COCAINSCRNUR POSITIVE (A) 07/31/2021 1147  ? LABBENZ NONE DETECTED 07/31/2021 1147  ? AMPHETMU NONE DETECTED 07/31/2021 1147  ? THCU NONE DETECTED 07/31/2021 1147  ? LABBARB NONE DETECTED 07/31/2021 1147  ?  ? ?DDimer No results for input(s): DDIMER in the last 168 hours.  ? ?Radiology  ?  ?ECHOCARDIOGRAM COMPLETE ? ?Result Date: 08/01/2021 ?   ECHOCARDIOGRAM REPORT   Patient Name:   BOLUWATIFE GAVETTE Date of Exam: 08/01/2021 Medical Rec #:  [id number]        Height:       72.0 in Accession #:    [id number]       Weight:       177.0 lb Date of Birth:  11/07/1960       BSA:          2.023 m? Patient Age:    61 years         BP:           104/64 mmHg Patient Gender: M                HR:           64 bpm. Exam Location:  Inpatient Procedure: 2D Echo, 3D Echo, Cardiac Doppler, Strain Analysis and Color Doppler STAT ECHO Indications:    Chest Pain R07.9  History:        Patient has prior history of Echocardiogram examinations, most                  recent 10/26/2020. CAD and NSTEMI. Polysubstance abuse. Chronic                 kidney disease.  Sonographer:    Darlina Sicilian RDCS Referring Phys: [id number] [place]  1. Large mural thrombus at the apex and separate thrombus associated with the posterior mitral leaflet/mitral commisure. Left ventricular ejection fraction, by estimation, is 20 to 25%. The left ventricle has severely decreased function. The left ventricle demonstrates global hypokinesis. The left ventricular internal cavity size was severely dilated. Left ventricular diastolic parameters are consistent with Grade III diastolic dysfunction (restrictive). Elevated left ventricular end-diastolic pressure. The E/e' is 81.  2. Right ventricular systolic function is low normal. The right ventricular size is mildly enlarged.  3. The mitral valve is abnormal. Trivial mitral valve regurgitation.  4. The aortic valve is tricuspid. Aortic valve regurgitation is not visualized.  5. The inferior vena cava is normal in size with greater than 50% respiratory variability, suggesting right atrial pressure of 3 mmHg. Comparison(s): Changes from prior study are noted. 10/26/2020: LVEF 30-35%, moderately dilated LV. Conclusion(s)/Recommendation(s): Critical findings reported to Dr. Lovena Le and acknowledged at 3:30 pm. FINDINGS  Left Ventricle: Large mural thrombus at the apex and separate thrombus associated with the posterior mitral leaflet/mitral commisure. Left ventricular ejection fraction, by estimation, is 20 to 25%. The left ventricle has severely decreased function. The left ventricle demonstrates global hypokinesis. The left ventricular internal cavity size was severely dilated. There is no left ventricular hypertrophy. Left ventricular diastolic parameters are consistent with Grade III diastolic dysfunction (restrictive). Elevated left ventricular end-diastolic pressure. The E/e' is 67. Right Ventricle: The right ventricular size is  mildly enlarged. No increase in right ventricular wall thickness. Right ventricular systolic function is low normal. Left Atrium: Left atrial size was normal in size. Right Atrium: Right atrial size was normal in size. Pericardium: There is no evidence of pericardial effusion. Mitral Valve: The mitral valve is abnormal. There is mild thickening of  the anterior and posterior mitral valve leaflet(s). Trivial mitral valve regurgitation. Tricuspid Valve: The tricuspid valve is grossly normal. Tricuspid valve regurgitation is trivial. Aortic Valve: The aortic valve is tricuspid. Aortic valve regurgitation is not visualized. Pulmonic Valve: The pulmonic valve was grossly normal. Pulmonic valve regurgitation is mild. Aorta: The aortic root and ascending aorta are structurally normal, with no evidence of dilitation. Venous: The inferior vena cava is normal in size with greater than 50% respiratory variability, suggesting right atrial pressure of 3 mmHg. IAS/Shunts: There is right bowing of the interatrial septum, suggestive of elevated left atrial pressure. No atrial level shunt detected by color flow Doppler.  LEFT VENTRICLE PLAX 2D LVIDd:         7.10 cm      Diastology LVIDs:         6.20 cm      LV e' medial:    2.81 cm/s LV PW:         1.00 cm      LV E/e' medial:  29.3 LV IVS:        0.90 cm      LV e' lateral:   2.59 cm/s LVOT diam:     2.00 cm      LV E/e' lateral: 31.8 LV SV:         40 LV SV Index:   20 LVOT Area:     3.14 cm?  LV Volumes (MOD) LV vol d, MOD A2C: 264.0 ml LV vol d, MOD A4C: 228.0 ml LV vol s, MOD A2C: 167.0 ml LV vol s, MOD A4C: 162.0 ml LV SV MOD A2C:     97.0 ml LV SV MOD A4C:     228.0 ml LV SV MOD BP:      83.4 ml RIGHT VENTRICLE RV Basal diam:  5.20 cm RV Mid diam:    3.10 cm RV S prime:     10.70 cm/s TAPSE (M-mode): 1.7 cm LEFT ATRIUM             Index        RIGHT ATRIUM           Index LA diam:        4.60 cm 2.27 cm/m?   RA Area:     21.30 cm? LA Vol (A2C):   47.3 ml 23.38 ml/m?  RA  Volume:   63.70 ml  31.49 ml/m? LA Vol (A4C):   35.5 ml 17.55 ml/m? LA Biplane Vol: 40.6 ml 20.07 ml/m?  AORTIC VALVE             PULMONIC VALVE LVOT Vmax:   75.60 cm/s  PV Vmax:          2.73 m/s LVOT Vme

## 2021-08-02 NOTE — Progress Notes (Signed)
40fr venous femoral sheath removed at 1115 ?ACT: 170 ?Pressure held for 10 minutes ?Dressed with guaze and tegaderm ? ?Right DP and PT present ?

## 2021-08-02 NOTE — Progress Notes (Addendum)
?  Update- CSW spoke with Sharyn Lull at [facility] who confirmed they have bed availability. CSW met with patient at bedside and informed patient. Patient has decided at dc that he wants to dc to [facility]. CSW informed MD and CM. CSW will follow up with [facility] in the morning. Patient will need transportation to [facility] when ready for dc. ? ? ?CSW received consult for substance use resources for patient. CSW met with patient at bedside. Patient reports he comes from motel with significant other. CSW offered patient outpatient substance use treatment services resources. Patient accepted. Patient may be interested in going to [facility] at dc if [facility] has bed available. Patient gave CSW permission to speak with [facility].If patient decides not to go to [facility] then he would like to dc to [facility] when medically ready for dc. CSW called [facility] and left voicemail for [place]. CSW awaiting callback. CSW informed patient that CSW will follow back up with him after hearing back from [facility]. Patient reports he will need transportation when medically ready for dc. All questions answered. No further questions reported at this time.CSW will continue to follow and assist with patients dc planning needs. ?

## 2021-08-02 NOTE — TOC Benefit Eligibility Note (Signed)
Patient Advocate Encounter  Insurance verification completed.    The patient is currently admitted and upon discharge could be taking Eliquis 5 mg.  The current 30 day co-pay is, $4.00.   The patient is currently admitted and upon discharge could be taking Xarelto 20 mg.  The current 30 day co-pay is, $4.00.   The patient is insured through [place] [employer]     Chey Cho, CPhT Pharmacy Patient Advocate Specialist [facility] Patient Advocate Team Direct Number: [phone]  Fax: [phone]        

## 2021-08-02 NOTE — Progress Notes (Signed)
Notified by CCMD at 2255 pt had a missed beat. Strip saved. Will continue to monitor. Dierdre Highman, RN  ?

## 2021-08-02 NOTE — Interval H&P Note (Signed)
History and Physical Interval Note: ? ?08/02/2021 ?9:32 AM ? ?Allen Mayer  has presented today for surgery, with the diagnosis of NSTEMI and acute on chronic HFrEF.  The various methods of treatment have been discussed with the patient and family. After consideration of risks, benefits and other options for treatment, the patient has consented to right heart catheterization and coronary angiography as a surgical intervention.  The patient's history has been reviewed, patient examined, no change in status, stable for surgery.  I have reviewed the patient's chart and labs.  Questions were answered to the patient's satisfaction.   ? ?Cath Lab Visit (complete for each Cath Lab visit) ? ?Clinical Evaluation Leading to the Procedure:  ? ?ACS: Yes.   ? ?Non-ACS:  N/A ? ?Allen Mayer ? ? ?

## 2021-08-02 NOTE — Progress Notes (Signed)
ANTICOAGULATION CONSULT NOTE  ?Pharmacy Consult for Heparin ?Indication: chest pain/ACS ? ? ?No Known Allergies ? ?Patient Measurements: ?Height: 6' (182.9 cm) ?Weight: 80.3 kg (177 lb) ?IBW/kg (Calculated) : 77.6 ? ?Vital Signs: ?Temp: 98.4 ?F (36.9 ?C) (04/24 [id number]) ?Temp Source: Oral (04/24 [id number]) ?BP: 105/75 (04/24 1100) ?Pulse Rate: 71 (04/24 1100) ? ?Labs: ?Recent Labs  ?  07/31/21 ?0204 07/31/21 ?[id number] 07/31/21 ?[id number] 07/31/21 ?1000 07/31/21 ?1508 07/31/21 ?1521 08/01/21 ?0203 08/02/21 ?0430 08/02/21 ?1024 08/02/21 ?1029  ?HGB 14.3  --   --   --  15.8  --  14.6 16.0 16.7 17.0  ?HCT 45.0  --   --   --  48.4  --  44.9 49.9 49.0 50.0  ?PLT 153  --   --   --  160  --  159 167  --   --   ?HEPARINUNFRC  --   --   --    < >  --  0.30 0.25* 0.31  --   --   ?CREATININE 1.66*  --  1.39*  --   --   --   --  1.40*  --   --   ?TROPONINIHS 46* 159* 1,901*  --   --   --   --   --   --   --   ? < > = values in this interval not displayed.  ? ? ? ?Estimated Creatinine Clearance: 60 mL/min (A) (by C-G formula based on SCr of 1.4 mg/dL (H)). ? ?Assessment: ?61 yo male with NSTEMI s/p cath with mild CAD. Echo shows large mural thrombus and pharmacy to start heparin 2 hours after TR band removal (sheath removed ~ 10:45am). He is noted to be homeless ?-last heparin rate 1300 units/hr and heparin level was at goal ? ?Goal of Therapy:  ?Heparin level 0.3-0.7 units/ml ?Monitor platelets by anticoagulation protocol: Yes ?  ?Plan:  ?-Restart heparin 2 hours after TR band removal ?-Heparin level in 6 hours and daily wth CBC daily ? ?Hildred Laser, PharmD ?Clinical Pharmacist ?**Pharmacist phone directory can now be found on [email or web address] ([email or web address]).  Listed under [place]. ? ? ? ? ?

## 2021-08-03 ENCOUNTER — Encounter (HOSPITAL_COMMUNITY): Payer: Self-pay | Admitting: Cardiology

## 2021-08-03 ENCOUNTER — Other Ambulatory Visit (HOSPITAL_COMMUNITY): Payer: Self-pay

## 2021-08-03 DIAGNOSIS — I513 Intracardiac thrombosis, not elsewhere classified: Secondary | ICD-10-CM

## 2021-08-03 LAB — CBC
HCT: 48.4 % (ref 39.0–52.0)
Hemoglobin: 15.5 g/dL (ref 13.0–17.0)
MCH: 29.1 pg (ref 26.0–34.0)
MCHC: 32 g/dL (ref 30.0–36.0)
MCV: 90.8 fL (ref 80.0–100.0)
Platelets: 182 10*3/uL (ref 150–400)
RBC: 5.33 MIL/uL (ref 4.22–5.81)
RDW: 15.3 % (ref 11.5–15.5)
WBC: 6.3 10*3/uL (ref 4.0–10.5)
nRBC: 0 % (ref 0.0–0.2)

## 2021-08-03 LAB — BASIC METABOLIC PANEL
Anion gap: 7 (ref 5–15)
BUN: 17 mg/dL (ref 8–23)
CO2: 26 mmol/L (ref 22–32)
Calcium: 8.8 mg/dL — ABNORMAL LOW (ref 8.9–10.3)
Chloride: 105 mmol/L (ref 98–111)
Creatinine, Ser: 1.27 mg/dL — ABNORMAL HIGH (ref 0.61–1.24)
GFR, Estimated: >60 mL/min (ref >60)
Glucose, Bld: 101 mg/dL — ABNORMAL HIGH (ref 70–99)
Potassium: 4.3 mmol/L (ref 3.5–5.1)
Sodium: 138 mmol/L (ref 135–145)

## 2021-08-03 LAB — HEPARIN LEVEL (UNFRACTIONATED): Heparin Unfractionated: 0.38 IU/mL (ref 0.30–0.70)

## 2021-08-03 LAB — PROTIME-INR
INR: 1.1 (ref 0.8–1.2)
Prothrombin Time: 13.9 seconds (ref 11.4–15.2)

## 2021-08-03 MED ORDER — ASPIRIN 81 MG PO TBEC
81.0000 mg | DELAYED_RELEASE_TABLET | Freq: Every day | ORAL | 11 refills | Status: DC
  Filled 2021-08-03: qty 30, 30d supply, fill #0

## 2021-08-03 MED ORDER — RIVAROXABAN 20 MG PO TABS
20.0000 mg | ORAL_TABLET | Freq: Every day | ORAL | 11 refills | Status: DC
  Filled 2021-08-03: qty 30, 30d supply, fill #0

## 2021-08-03 MED ORDER — RIVAROXABAN 20 MG PO TABS
20.0000 mg | ORAL_TABLET | Freq: Every day | ORAL | Status: DC
  Administered 2021-08-03: 20 mg via ORAL
  Filled 2021-08-03: qty 1

## 2021-08-03 MED ORDER — TORSEMIDE 20 MG PO TABS: 20.0000 mg | ORAL_TABLET | Freq: Every day | ORAL | 6 refills | Status: DC

## 2021-08-03 MED ORDER — ATORVASTATIN CALCIUM 80 MG PO TABS: 80.0000 mg | ORAL_TABLET | Freq: Every day | ORAL | 11 refills | Status: DC

## 2021-08-03 MED ORDER — TORSEMIDE 20 MG PO TABS
20.0000 mg | ORAL_TABLET | Freq: Every day | ORAL | 6 refills | Status: DC
Start: 2021-08-03 — End: 2021-11-04
  Filled 2021-08-03: qty 30, 30d supply, fill #0

## 2021-08-03 MED ORDER — RIVAROXABAN 20 MG PO TABS: 20.0000 mg | ORAL_TABLET | Freq: Every day | ORAL | 11 refills | Status: DC

## 2021-08-03 MED ORDER — NITROGLYCERIN 0.4 MG SL SUBL
0.4000 mg | SUBLINGUAL_TABLET | SUBLINGUAL | 1 refills | Status: DC | PRN
  Filled 2021-08-03: qty 25, 14d supply, fill #0

## 2021-08-03 MED ORDER — HYDRALAZINE HCL 25 MG PO TABS
25.0000 mg | ORAL_TABLET | Freq: Three times a day (TID) | ORAL | 6 refills | Status: DC
  Filled 2021-08-03: qty 90, 30d supply, fill #0

## 2021-08-03 MED ORDER — ASPIRIN 81 MG PO TBEC: 81.0000 mg | DELAYED_RELEASE_TABLET | Freq: Every day | ORAL | 11 refills | Status: DC

## 2021-08-03 NOTE — Discharge Instructions (Signed)
Information on my medicine - XARELTO? (Rivaroxaban) ? ?Why was Xarelto? prescribed for you? ?Xarelto? was prescribed for you to reduce the risk of stroke from a clot found in the heart ? ?What do you need to know about xarelto? ? ?Take your Xarelto? ONCE DAILY at the same time every day with your evening meal. ?If you have difficulty swallowing the tablet whole, you may crush it and mix in applesauce just prior to taking your dose. ? ?Take Xarelto? exactly as prescribed by your doctor and DO NOT stop taking Xarelto? without talking to the doctor who prescribed the medication.  Stopping without other stroke prevention medication to take the place of Xarelto? may increase your risk of developing a clot that causes a stroke.  Refill your prescription before you run out. ? ?After discharge, you should have regular check-up appointments with your healthcare provider that is prescribing your Xarelto?Marland Kitchen  In the future your dose may need to be changed if your kidney function or weight changes by a significant amount. ? ?What do you do if you miss a dose? ?If you are taking Xarelto? ONCE DAILY and you miss a dose, take it as soon as you remember on the same day then continue your regularly scheduled once daily regimen the next day. Do not take two doses of Xarelto? at the same time or on the same day.  ? ?Important Safety Information ?A possible side effect of Xarelto? is bleeding. You should call your healthcare provider right away if you experience any of the following: ?Bleeding from an injury or your nose that does not stop. ?Unusual colored urine (red or dark brown) or unusual colored stools (red or black). ?Unusual bruising for unknown reasons. ?A serious fall or if you hit your head (even if there is no bleeding). ? ?Some medicines may interact with Xarelto? and might increase your risk of bleeding while on Xarelto?Marland Kitchen To help avoid this, consult your healthcare provider or pharmacist prior to using any new prescription  or non-prescription medications, including herbals, vitamins, non-steroidal anti-inflammatory drugs (NSAIDs) and supplements. ? ?This website has more information on Xarelto?: [email or web address].com.br.  ?

## 2021-08-03 NOTE — Progress Notes (Signed)
ANTICOAGULATION CONSULT NOTE  ?Pharmacy Consult for Heparin ?Indication: mural thrombus ?Brief A/P: Heparin level subtherapeutic Increase Heparin rate ? ?No Known Allergies ? ?Patient Measurements: ?Height: 6' (182.9 cm) ?Weight: 80.3 kg (177 lb) ?IBW/kg (Calculated) : 77.6 ? ?Vital Signs: ?Temp: 97.6 ?F (36.4 ?C) (04/24 1935) ?Temp Source: Oral (04/24 1935) ?BP: 119/90 (04/24 1935) ?Pulse Rate: 76 (04/24 2023) ? ?Labs: ?Recent Labs  ?  07/31/21 ?0204 07/31/21 ?[id number] 07/31/21 ?[id number] 07/31/21 ?1000 07/31/21 ?1508 07/31/21 ?1521 08/01/21 ?0203 08/02/21 ?0430 08/02/21 ?1024 08/02/21 ?1029 08/02/21 ?2155  ?HGB 14.3  --   --   --  15.8  --  14.6 16.0 16.7 17.0  --   ?HCT 45.0  --   --   --  48.4  --  44.9 49.9 49.0 50.0  --   ?PLT 153  --   --   --  160  --  159 167  --   --   --   ?HEPARINUNFRC  --   --   --    < >  --    < > 0.25* 0.31  --   --  0.21*  ?CREATININE 1.66*  --  1.39*  --   --   --   --  1.40*  --   --   --   ?TROPONINIHS 46* 159* 1,901*  --   --   --   --   --   --   --   --   ? < > = values in this interval not displayed.  ? ? ? ?Estimated Creatinine Clearance: 60 mL/min (A) (by C-G formula based on SCr of 1.4 mg/dL (H)). ? ?Assessment: ?61 y.o. male with LV thrombus s/p cath for heparin  ? ?Goal of Therapy:  ?Heparin level 0.3-0.7 units/ml ?Monitor platelets by anticoagulation protocol: Yes ?  ?Plan:  ?Increase Heparin 1450 units/hr ?Follow-up am labs.  ? ?Shametra Cumberland, Bronson Curb ?08/03/2021,12:29 AM ? ? ?

## 2021-08-03 NOTE — Progress Notes (Addendum)
? ?Progress Note ? ?Patient Name: Allen Mayer ?Date of Encounter: 08/03/2021 ? ?Primary Cardiologist: Minus Breeding, MD  ? ?Subjective  ? ?Willing to go to a shelter after d/c ?Says this has made him not want to smoke, says will not do drugs ? ?Inpatient Medications  ?  ?Scheduled Meds: ? aspirin EC  81 mg Oral Daily  ? atorvastatin  80 mg Oral Daily  ? budesonide  0.5 mg Inhalation BID  ? carvedilol  6.25 mg Oral BID  ? hydrALAZINE  25 mg Oral Q8H  ? sodium chloride flush  3 mL Intravenous Q12H  ? ?Continuous Infusions: ? sodium chloride    ? heparin 1,450 Units/hr (08/03/21 0327)  ? ?PRN Meds: ?sodium chloride, acetaminophen, albuterol, nitroGLYCERIN, ondansetron (ZOFRAN) IV, sodium chloride flush  ? ?Vital Signs  ?  ?Vitals:  ? 08/02/21 1935 08/02/21 2023 08/03/21 0419 08/03/21 0901  ?BP: 119/90  103/84   ?Pulse: 81 76 97   ?Resp:  18    ?Temp: 97.6 ?F (36.4 ?C)  98.8 ?F (37.1 ?C)   ?TempSrc: Oral  Oral   ?SpO2: 97% 98% 97% 98%  ?Weight:      ?Height:      ? ? ?Intake/Output Summary (Last 24 hours) at 08/03/2021 0902 ?Last data filed at 08/03/2021 0416 ?Gross per 24 hour  ?Intake 141.47 ml  ?Output 1300 ml  ?Net -1158.53 ml  ? ?Filed Weights  ? 07/31/21 0215 07/31/21 1506  ?Weight: 97.8 kg 80.3 kg  ? ? ?Telemetry  ?  ?SR, PVCs, pairs, 3 bt run NSVT - Personally Reviewed ? ?ECG  ?  ?None today - Personally Reviewed ? ?Physical Exam  ? ?General: Well developed, well nourished, male in no acute distress ?Head: Eyes PERRLA, Head normocephalic and atraumatic ?Lungs: clear bilaterally to auscultation. ?Heart: HRRR S1 S2, without rub or gallop. No murmur. 4/4 extremity pulses are 2+ & equal. No JVD. ?Abdomen: Bowel sounds are present, abdomen soft and non-tender without masses or  hernias noted. ?Msk: Normal strength and tone for age. ?Extremities: No clubbing, cyanosis or edema.    ?Skin:  No rashes or lesions noted. ?Neuro: Alert and oriented X 3. ?Psych:  Good affect, responds appropriately ? ?Labs  ?   ?Chemistry ?Recent Labs  ?Lab 07/31/21 ?0204 07/31/21 ?[id number] 08/02/21 ?0430 08/02/21 ?1024 08/02/21 ?1029  ?NA 136 136 135 137 136  ?K 4.3 4.3 4.2 4.0 4.1  ?CL 103 103 100  --   --   ?CO2 27 26 27   --   --   ?GLUCOSE 116* 107* 99  --   --   ?BUN 26* 24* 19  --   --   ?CREATININE 1.66* 1.39* 1.40*  --   --   ?CALCIUM 8.5* 8.5* 9.1  --   --   ?GFRNONAA 46* 57* 57*  --   --   ?ANIONGAP 6 7 8   --   --   ?  ? ?Hematology ?Recent Labs  ?Lab 08/01/21 ?0203 08/02/21 ?0430 08/02/21 ?1024 08/02/21 ?1029 08/03/21 ?0734  ?WBC 5.5 6.3  --   --  6.3  ?RBC 4.99 5.59  --   --  5.33  ?HGB 14.6 16.0 16.7 17.0 15.5  ?HCT 44.9 49.9 49.0 50.0 48.4  ?MCV 90.0 89.3  --   --  90.8  ?MCH 29.3 28.6  --   --  29.1  ?MCHC 32.5 32.1  --   --  32.0  ?RDW 15.4 15.2  --   --  15.3  ?  PLT 159 167  --   --  182  ? ? ?Cardiac Enzymes High Sensitivity Troponin:   ?Recent Labs  ?Lab 07/26/21 ?0634 07/26/21 ?1818 07/31/21 ?0204 07/31/21 ?[id number] 07/31/21 ?0810  ?TROPONINIHS 40* 28* 46* 159* 1,901*  ?   ?  ? ?BNP ?Recent Labs  ?Lab 07/31/21 ?0204  ?BNP 2,031.6*  ?  ?Drugs of Abuse  ?   ?Component Value Date/Time  ? LABOPIA POSITIVE (A) 07/31/2021 1147  ? COCAINSCRNUR POSITIVE (A) 07/31/2021 1147  ? LABBENZ NONE DETECTED 07/31/2021 1147  ? AMPHETMU NONE DETECTED 07/31/2021 1147  ? THCU NONE DETECTED 07/31/2021 1147  ? LABBARB NONE DETECTED 07/31/2021 1147  ?  ? ?DDimer No results for input(s): DDIMER in the last 168 hours.  ? ?Radiology  ?  ?CARDIAC CATHETERIZATION ? ?Result Date: 08/02/2021 ?Conclusions: Mild, non-obstructive coronary artery disease. Normal left and right heart filling pressures. Moderately-severely reduced Fick cardiac output/index. Recommendations: Maintain net even fluid balance. Escalate goal-directed medical therapy for NICM, as tolerated. Restart IV heparin 2 hours after TR band removal.  Transition to oral anticoagulation in the setting of LV thrombus per primary team. Nelva Bush, MD [place] ? ?ECHOCARDIOGRAM  COMPLETE ? ?Result Date: 08/01/2021 ?   ECHOCARDIOGRAM REPORT   Patient Name:   Allen Mayer Date of Exam: 08/01/2021 Medical Rec #:  [id number]        Height:       72.0 in Accession #:    [id number]       Weight:       177.0 lb Date of Birth:  Oct 17, 1960       BSA:          2.023 m? Patient Age:    61 years         BP:           104/64 mmHg Patient Gender: M                HR:           64 bpm. Exam Location:  Inpatient Procedure: 2D Echo, 3D Echo, Cardiac Doppler, Strain Analysis and Color Doppler STAT ECHO Indications:    Chest Pain R07.9  History:        Patient has prior history of Echocardiogram examinations, most                 recent 10/26/2020. CAD and NSTEMI. Polysubstance abuse. Chronic                 kidney disease.  Sonographer:    Darlina Sicilian RDCS Referring Phys: [id number] [place]  1. Large mural thrombus at the apex and separate thrombus associated with the posterior mitral leaflet/mitral commisure. Left ventricular ejection fraction, by estimation, is 20 to 25%. The left ventricle has severely decreased function. The left ventricle demonstrates global hypokinesis. The left ventricular internal cavity size was severely dilated. Left ventricular diastolic parameters are consistent with Grade III diastolic dysfunction (restrictive). Elevated left ventricular end-diastolic pressure. The E/e' is 29.  2. Right ventricular systolic function is low normal. The right ventricular size is mildly enlarged.  3. The mitral valve is abnormal. Trivial mitral valve regurgitation.  4. The aortic valve is tricuspid. Aortic valve regurgitation is not visualized.  5. The inferior vena cava is normal in size with greater than 50% respiratory variability, suggesting right atrial pressure of 3 mmHg. Comparison(s): Changes from prior study are noted. 10/26/2020: LVEF 30-35%, moderately dilated LV. Conclusion(s)/Recommendation(s): Critical findings reported to Dr.  Lovena Le and acknowledged at 3:30 pm.  FINDINGS  Left Ventricle: Large mural thrombus at the apex and separate thrombus associated with the posterior mitral leaflet/mitral commisure. Left ventricular ejection fraction, by estimation, is 20 to 25%. The left ventricle has severely decreased function. The left ventricle demonstrates global hypokinesis. The left ventricular internal cavity size was severely dilated. There is no left ventricular hypertrophy. Left ventricular diastolic parameters are consistent with Grade III diastolic dysfunction (restrictive). Elevated left ventricular end-diastolic pressure. The E/e' is 29. Right Ventricle: The right ventricular size is mildly enlarged. No increase in right ventricular wall thickness. Right ventricular systolic function is low normal. Left Atrium: Left atrial size was normal in size. Right Atrium: Right atrial size was normal in size. Pericardium: There is no evidence of pericardial effusion. Mitral Valve: The mitral valve is abnormal. There is mild thickening of the anterior and posterior mitral valve leaflet(s). Trivial mitral valve regurgitation. Tricuspid Valve: The tricuspid valve is grossly normal. Tricuspid valve regurgitation is trivial. Aortic Valve: The aortic valve is tricuspid. Aortic valve regurgitation is not visualized. Pulmonic Valve: The pulmonic valve was grossly normal. Pulmonic valve regurgitation is mild. Aorta: The aortic root and ascending aorta are structurally normal, with no evidence of dilitation. Venous: The inferior vena cava is normal in size with greater than 50% respiratory variability, suggesting right atrial pressure of 3 mmHg. IAS/Shunts: There is right bowing of the interatrial septum, suggestive of elevated left atrial pressure. No atrial level shunt detected by color flow Doppler.  LEFT VENTRICLE PLAX 2D LVIDd:         7.10 cm      Diastology LVIDs:         6.20 cm      LV e' medial:    2.81 cm/s LV PW:         1.00 cm      LV E/e' medial:  29.3 LV IVS:        0.90 cm       LV e' lateral:   2.59 cm/s LVOT diam:     2.00 cm      LV E/e' lateral: 31.8 LV SV:         40 LV SV Index:   20 LVOT Area:     3.14 cm?  LV Volumes (MOD) LV vol d, MOD A2C: 264.0 ml LV vol d, MOD A4C: 228.0 ml LV vol s, MOD

## 2021-08-03 NOTE — TOC Transition Note (Signed)
Transition of Care (TOC) - CM/SW Discharge Note ? ? ?Patient Details  ?Name: Allen Mayer ?MRN: [id number] ?Date of Birth: January 05, 1961 ? ?Transition of Care (TOC) CM/SW Contact:  ?Delilah Shan, LCSWA ?Phone Number: ?08/03/2021, 11:25 AM ? ? ?Clinical Narrative:    ? ?Patient will DC to: [address], [place], [place] [address] ? ?Anticipated DC date: 08/03/2021 ? ?Family notified: Shirlene ? ?Transport by: Bus  ? ?? ? ?Per MD patient ready for DC to [facility] . RN, patient, patient's family, notified of DC.Patient accepted bus pass for transportation. ? ?CSW signing off.  ? ?Final next level of care: Other (comment) [facility]) ?Barriers to Discharge: No Barriers Identified ? ? ?Patient Goals and CMS Choice ?Patient states their goals for this hospitalization and ongoing recovery are:: to go to [facility] ?CMS Medicare.gov Compare Post Acute Care list provided to:: Patient ?Choice offered to / list presented to : Patient ? ?Discharge Placement ?  ?           ?  ?Patient to be transferred to facility by: Bus pass ?Name of family member notified: Shirlene ?Patient and family notified of of transfer: 08/03/21 ? ?Discharge Plan and Services ?  ?  ?           ?  ?  ?  ?  ?  ?  ?  ?  ?  ?  ? ?Social Determinants of Health (SDOH) Interventions ?  ? ? ?Readmission Risk Interventions ? ?  12/18/2020  ? 12:36 PM  ?Readmission Risk Prevention Plan  ?Post Dischage Appt   ?Medication Screening   ?Transportation Screening   ?  ? Information is confidential and restricted. Go to Review Flowsheets to unlock data.  ? ? ? ? ? ?
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
## 2021-08-03 NOTE — Progress Notes (Signed)
[id number] pt had short episode of 2 degree HB type II. Pt sleeping, easily aroused, no complaints voiced. Strip Saved and will continue to monitor closely. Dierdre Highman, RN  ?

## 2021-08-13 ENCOUNTER — Emergency Department (HOSPITAL_COMMUNITY): Payer: Medicaid Other

## 2021-08-13 ENCOUNTER — Other Ambulatory Visit: Payer: Self-pay

## 2021-08-13 ENCOUNTER — Encounter (HOSPITAL_COMMUNITY): Payer: Self-pay

## 2021-08-13 ENCOUNTER — Emergency Department (HOSPITAL_COMMUNITY)
Admission: EM | Admit: 2021-08-13 | Discharge: 2021-08-13 | Disposition: A | Discharge status: Home / Self Care | Payer: Medicaid Other | Attending: Emergency Medicine | Admitting: Emergency Medicine

## 2021-08-13 DIAGNOSIS — Z7901 Long term (current) use of anticoagulants: Secondary | ICD-10-CM | POA: Diagnosis not present

## 2021-08-13 DIAGNOSIS — M25561 Pain in right knee: Secondary | ICD-10-CM | POA: Diagnosis not present

## 2021-08-13 DIAGNOSIS — Z7982 Long term (current) use of aspirin: Secondary | ICD-10-CM | POA: Insufficient documentation

## 2021-08-13 DIAGNOSIS — M25562 Pain in left knee: Secondary | ICD-10-CM | POA: Diagnosis not present

## 2021-08-13 DIAGNOSIS — I2510 Atherosclerotic heart disease of native coronary artery without angina pectoris: Secondary | ICD-10-CM | POA: Insufficient documentation

## 2021-08-13 DIAGNOSIS — J449 Chronic obstructive pulmonary disease, unspecified: Secondary | ICD-10-CM | POA: Insufficient documentation

## 2021-08-13 DIAGNOSIS — M2550 Pain in unspecified joint: Secondary | ICD-10-CM

## 2021-08-13 DIAGNOSIS — M25511 Pain in right shoulder: Secondary | ICD-10-CM | POA: Diagnosis not present

## 2021-08-13 LAB — BASIC METABOLIC PANEL
Anion gap: 8 (ref 5–15)
BUN: 36 mg/dL — ABNORMAL HIGH (ref 8–23)
CO2: 25 mmol/L (ref 22–32)
Calcium: 9 mg/dL (ref 8.9–10.3)
Chloride: 104 mmol/L (ref 98–111)
Creatinine, Ser: 1.15 mg/dL (ref 0.61–1.24)
GFR, Estimated: >60 mL/min (ref >60)
Glucose, Bld: 88 mg/dL (ref 70–99)
Potassium: 4.5 mmol/L (ref 3.5–5.1)
Sodium: 137 mmol/L (ref 135–145)

## 2021-08-13 LAB — CBC WITH DIFFERENTIAL/PLATELET
Abs Immature Granulocytes: 0.02 10*3/uL (ref 0.00–0.07)
Basophils Absolute: 0 10*3/uL (ref 0.0–0.1)
Basophils Relative: 1 %
Eosinophils Absolute: 0.2 10*3/uL (ref 0.0–0.5)
Eosinophils Relative: 4 %
HCT: 45.4 % (ref 39.0–52.0)
Hemoglobin: 14.5 g/dL (ref 13.0–17.0)
Immature Granulocytes: 0 %
Lymphocytes Relative: 21 %
Lymphs Abs: 1.2 10*3/uL (ref 0.7–4.0)
MCH: 28.9 pg (ref 26.0–34.0)
MCHC: 31.9 g/dL (ref 30.0–36.0)
MCV: 90.4 fL (ref 80.0–100.0)
Monocytes Absolute: 0.7 10*3/uL (ref 0.1–1.0)
Monocytes Relative: 12 %
Neutro Abs: 3.6 10*3/uL (ref 1.7–7.7)
Neutrophils Relative %: 62 %
Platelets: 244 10*3/uL (ref 150–400)
RBC: 5.02 MIL/uL (ref 4.22–5.81)
RDW: 14.8 % (ref 11.5–15.5)
WBC: 5.8 10*3/uL (ref 4.0–10.5)
nRBC: 0 % (ref 0.0–0.2)

## 2021-08-13 LAB — BRAIN NATRIURETIC PEPTIDE: B Natriuretic Peptide: 882 pg/mL — ABNORMAL HIGH (ref 0.0–100.0)

## 2021-08-13 MED ORDER — CYCLOBENZAPRINE HCL 10 MG PO TABS: 10.0000 mg | ORAL_TABLET | Freq: Two times a day (BID) | ORAL | 0 refills | Status: DC | PRN

## 2021-08-13 MED ORDER — IBUPROFEN 800 MG PO TABS
800.0000 mg | ORAL_TABLET | Freq: Once | ORAL | Status: AC
  Administered 2021-08-13: 800 mg via ORAL
  Filled 2021-08-13: qty 1

## 2021-08-13 MED ORDER — ACETAMINOPHEN 500 MG PO TABS: 500.0000 mg | ORAL_TABLET | Freq: Four times a day (QID) | ORAL | 0 refills | Status: DC | PRN

## 2021-08-13 NOTE — ED Provider Notes (Signed)
?[facility] EMERGENCY DEPARTMENT ?Provider Note ? ? ?CSN: [id number] ?Arrival date & time: 08/13/21  1331 ? ?  ? ?History ? ?No chief complaint on file. ? ? ?Allen Mayer is a 61 y.o. male. ? ?The history is provided by the patient, medical records and the spouse. No language interpreter was used.  ? ?61 year old male history of CHF, COPD, CAD, dilated cardiomyopathy, polysubstance use, brought here via EMS from home with complaints of joint pain.  For the past week patient complaining of right shoulder pain, bilateral knee pain worsening with ambulation.  Increasing pain when he walks.  Does not complain of any fever chest pain.  States that he is having shortness of breath but states not unusual for him.  Endorse occasional cough productive with white phlegm.  No fever or chills denies any recent injury and no headache.  No specific treatment tried.  No history of gout. ? ?Home Medications ?Prior to Admission medications   ?Medication Sig Start Date End Date Taking? Authorizing Provider  ?acetaminophen (TYLENOL) 500 MG tablet Take 2 tablets (1,000 mg total) by mouth every 6 (six) hours as needed. ?Patient not taking: Reported on 08/01/2021 12/18/20   Barnetta Chapel, PA-C  ?albuterol (VENTOLIN HFA) 108 (90 Base) MCG/ACT inhaler Inhale 1-2 puffs into the lungs every 6 (six) hours as needed for wheezing or shortness of breath. 07/26/21   Al Decant, PA-C  ?aspirin 81 MG EC tablet Take 1 tablet (81 mg total) by mouth daily. Swallow whole. 08/03/21   Barrett, Joline Salt, PA-C  ?atorvastatin (LIPITOR) 80 MG tablet Take 1 tablet (80 mg total) by mouth daily. 08/03/21   Barrett, Joline Salt, PA-C  ?carvedilol (COREG) 6.25 MG tablet Take 1 tablet (6.25 mg total) by mouth 2 (two) times daily. 09/18/20   Jodelle Gross, NP  ?citalopram (CELEXA) 20 MG tablet Take 1 tablet (20 mg total) by mouth daily. 05/22/20   Jodelle Gross, NP  ?FLOVENT HFA 110 MCG/ACT inhaler Inhale 2 puffs into the lungs 2  (two) times daily. 12/04/19   Rollene Rotunda, MD  ?gabapentin (NEURONTIN) 400 MG capsule Take 1 capsule (400 mg total) by mouth daily as needed (nerve pain). 05/22/20   Jodelle Gross, NP  ?hydrALAZINE (APRESOLINE) 25 MG tablet Take 1 tablet (25 mg total) by mouth every 8 (eight) hours. 08/03/21   Barrett, Joline Salt, PA-C  ?nitroGLYCERIN (NITROSTAT) 0.4 MG SL tablet Place 1 tablet (0.4 mg total) under the tongue every 5 (five) minutes as needed for chest pain. 08/03/21   Barrett, Joline Salt, PA-C  ?rivaroxaban (XARELTO) 20 MG TABS tablet Take 1 tablet (20 mg total) by mouth daily with supper. 08/03/21   Barrett, Joline Salt, PA-C  ?torsemide (DEMADEX) 20 MG tablet Take 1 tablet (20 mg total) by mouth daily. Take an extra tablet daily as needed for edema or weight gain 08/03/21   Barrett, Joline Salt, PA-C  ?   ? ?Allergies    ?Patient has no known allergies.   ? ?Review of Systems   ?Review of Systems  ?All other systems reviewed and are negative. ? ?Physical Exam ?Updated Vital Signs ?BP (!) 134/91   Pulse 81   Temp 97.7 ?F (36.5 ?C) (Oral)   Resp 19   Ht 6' (1.829 m)   Wt 81.6 kg   SpO2 98%   BMI 24.41 kg/m?  ?Physical Exam ?Vitals and nursing note reviewed.  ?Constitutional:   ?   General: He is not in acute distress. ?  Appearance: He is well-developed.  ?HENT:  ?   Head: Atraumatic.  ?Eyes:  ?   Conjunctiva/sclera: Conjunctivae normal.  ?Cardiovascular:  ?   Rate and Rhythm: Normal rate and regular rhythm.  ?   Pulses: Normal pulses.  ?   Heart sounds: Normal heart sounds.  ?Pulmonary:  ?   Effort: Pulmonary effort is normal.  ?   Breath sounds: Normal breath sounds. No wheezing, rhonchi or rales.  ?Abdominal:  ?   Palpations: Abdomen is soft.  ?Musculoskeletal:     ?   General: Tenderness (Tenderness to palpation of the right shoulder, bilateral knees, bilateral ankles.  Right knee is mildly edematous.  Able to range each joints fully.) present.  ?   Cervical back: Neck supple.  ?Skin: ?   Findings: No rash.   ?Neurological:  ?   Mental Status: He is alert.  ? ? ?ED Results / Procedures / Treatments   ?Labs ?(all labs ordered are listed, but only abnormal results are displayed) ?Labs Reviewed  ?BASIC METABOLIC PANEL - Abnormal; Notable for the following components:  ?    Result Value  ? BUN 36 (*)   ? All other components within normal limits  ?BRAIN NATRIURETIC PEPTIDE - Abnormal; Notable for the following components:  ? B Natriuretic Peptide 882.0 (*)   ? All other components within normal limits  ?CBC WITH DIFFERENTIAL/PLATELET  ? ? ?EKG ?EKG Interpretation ? ?Date/Time:  Friday Aug 13 2021 14:08:25 EDT ?Ventricular Rate:  81 ?PR Interval:  220 ?QRS Duration: 95 ?QT Interval:  392 ?QTC Calculation: 455 ?R Axis:   -68 ?Text Interpretation: Sinus rhythm Prolonged PR interval Probable left atrial enlargement Left ventricular hypertrophy  No significant change from prior tracing Confirmed by Alvester Chou [phone]) on 08/13/2021 2:16:01 PM ? ?Radiology ?DG Chest 2 View ? ?Result Date: 08/13/2021 ?CLINICAL DATA:  Fluid buildup, shortness of breath, history CHF, extremity swelling EXAM: CHEST - 2 VIEW COMPARISON:  07/31/2021 FINDINGS: Enlargement of cardiac silhouette. Mediastinal contours and pulmonary vascularity normal. Lungs clear. No pulmonary infiltrate, pleural effusion, or pneumothorax. Osseous structures unremarkable. IMPRESSION: Enlargement of cardiac silhouette without acute infiltrate/edema. Electronically Signed   By: Ulyses Southward M.D.   On: 08/13/2021 13:58   ? ?Procedures ?Procedures  ? ? ?Medications Ordered in ED ?Medications  ?ibuprofen (ADVIL) tablet 800 mg (800 mg Oral Given 08/13/21 1440)  ? ? ?ED Course/ Medical Decision Making/ A&P ?  ?                        ?Medical Decision Making ?Amount and/or Complexity of Data Reviewed ?Labs: ordered. ?Radiology: ordered. ? ? ?BP (!) 134/91   Pulse 81   Temp 97.9 ?F (36.6 ?C) (Oral)   Resp 19   Ht 6' (1.829 m)   Wt 81.6 kg   SpO2 98%   BMI 24.41 kg/m?  ? ?2:05  PM ?This is a 61 year old male, history of homelessness, polysubstance abuse including tobacco and cocaine, COPD, nonischemic cardiomyopathy with EF less than 20%, LV thrombus, schizophrenia, previously admitted on 4/22 for NSTEMI presenting complaining of joint pain.  His joint pain has been ongoing for the past week.  On exam he does have tenderness about the right shoulder, and bilateral knee.  Right knee is edematous but not erythematous and no warmth to suggest septic joint or gout.  No peripheral edema appreciated no tenderness to the calf to suggest DVT.  Shortness of breath seems to be chronic in  nature likely secondary to his ongoing CHF with an EF of 25%.  I have reviewed prior notes. ?Plan of care.  Patient does have LV thrombi and currently on anticoagulant, Xarelto.  He does have history of possible use but no history of IV drug use to suggest septic joint.  Patient overall well-appearing.  Does not appear to be fluid overload.  He is requesting for something to eat. ? ?3:27 PM ?Labs obtained and independently viewed interpreted by me chest x-ray individually visualized by me and agrees with radiologist interpretation.  Patient does have an elevated BNP of 882 however this is a much improvement value compared to his prior.  His chest x-ray obtained showing enlargement of the cardiac silhouette but no evidence of acute infiltrates or edema.  The remainder of his labs are reassuring.  As mentioned, no obvious signs of infection noted on today's visit.  He does not appear to be fluid overloaded to suggest CHF exacerbation.  His symptoms not consistent with DVT.  I felt that patient can be follow-up outpatient for further care.  I will provide medication for symptom control.  Patient was given ibuprofen with some improvement.  Crutches provided per request.  I realize patient is on Xarelto therefore will avoid NSAIDs prescription.  Patient without findings to suggest reactive arthritis or septic joint.  I  have considered advanced imaging but have low suspicion for fracture or dislocation. ? ? ? ? ? ? ? ?Final Clinical Impression(s) / ED Diagnoses ?Final diagnoses:  ?Polyarthralgia  ? ? ?Rx / DC Orders ?ED Discharge Orders

## 2021-08-13 NOTE — Progress Notes (Signed)
Orthopedic Tech Progress Note ?Patient Details:  ?Allen Mayer ?April 22, 1960 ?[id number] ? ?Ortho Devices ?Type of Ortho Device: Crutches ?Ortho Device/Splint Interventions: Ordered, Adjustment ?  ?Post Interventions ?Patient Tolerated: Well ?Instructions Provided: Adjustment of device, Poper ambulation with device ? ?Darleen Crocker ?08/13/2021, 4:09 PM ? ?

## 2021-08-13 NOTE — Discharge Instructions (Addendum)
Please take medication prescribed for your joint pain.  You may use crutches to help move about.  Follow-up closely with your doctor for further care. ?

## 2021-08-13 NOTE — ED Triage Notes (Signed)
Pt BIB [facility] from home c/o fluid build up. Pt states he has CHF and they just changed his meds he was suppose to follow up on Tuesday and could not make it and now feels like he has extra fluid on board and is c/o of pain in his knees d/t swelling.  ?
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
## 2021-08-16 ENCOUNTER — Encounter: Payer: Self-pay | Admitting: *Deleted

## 2021-08-16 ENCOUNTER — Ambulatory Visit: Payer: Medicaid Other | Admitting: General Practice

## 2021-08-16 NOTE — Congregational Nurse Program (Signed)
?  Dept: [phone] ? ? ?Congregational Nurse Program Note ? ?Date of Encounter: 08/16/2021 ? ?Past Medical History: ?Past Medical History:  ?Diagnosis Date  ? Acid reflux   ? Alcohol abuse   ? Arthritis   ? Asthma   ? Back pain   ? Bronchitis   ? Chest pain 07/31/2021  ? CHF (congestive heart failure) ([place])   ? CKD (chronic kidney disease)   ? CKD (chronic kidney disease), stage III ([place])   ? Cocaine abuse ([place])   ? COPD (chronic obstructive pulmonary disease) ([place])   ? History of noncompliance with medical treatment, presenting hazards to health   ? Homelessness   ? Hypertension   ? LV (left ventricular) mural thrombus   ? Myocardial infarction [facility])   ? Neuropathic pain   ? NICM (nonischemic cardiomyopathy) ([place]) 2019  ? NSTEMI (non-ST elevated myocardial infarction) ([place]) 07/31/2021  ? NSVT (nonsustained ventricular tachycardia) ([place])   ? Pericardial effusion   ? Pulmonary hypertension ([place])   ? RVF (right ventricular failure) ([place])   ? Schizo affective schizophrenia ([place])   ? ? ?Encounter Details: ? CNP Questionnaire - 08/16/21 1140   ? ?  ? Questionnaire  ? Do you give verbal consent to treat you today? Yes   ? Location Patient Served  [facility]   ? Visit Setting Church or Organization   ? Patient Status Unknown   ? Insurance Medicaid;Private or [place]   ? Insurance Referral N/A   ? Medication N/A   ? Medical Provider Yes   ? Screening Referrals N/A   ? Medical Referral N/A   ? Medical Appointment Made N/A   ? Food N/A   ? Transportation N/A   ? Housing/Utilities No permanent housing   ? Interpersonal Safety N/A   ? Intervention Support   ? ED Visit Averted N/A   ? Life-Saving Intervention Made N/A   ? ?  ?  ? ?  ? ?Client stopped by nurse's office while his friend was picking up mail. Client reports he recently got out of the hospital with heart problems. He reports he had leg swelling prior to going to hospital and he has to walk slow but it it is improving. Client reports he has an advocate at [facility] who can  help with mental health needs. No requests or concerns for writer today.  ?Braiden Rodman W RN CN ? ? ?

## 2021-08-17 ENCOUNTER — Other Ambulatory Visit (HOSPITAL_COMMUNITY): Payer: Self-pay

## 2021-08-17 ENCOUNTER — Telehealth (HOSPITAL_COMMUNITY): Payer: Self-pay

## 2021-08-17 NOTE — Telephone Encounter (Signed)
Transitions of Care Pharmacy  ? ?Call attempted for a pharmacy transitions of care follow-up.   ? ?Call attempt #1. Will follow-up in 2-3 days.  ?  ?Jiles Crocker, PharmD ?Clinical Pharmacist ?[facility] ?08/17/2021 11:14 AM ? ?

## 2021-08-18 ENCOUNTER — Other Ambulatory Visit (HOSPITAL_COMMUNITY): Payer: Self-pay

## 2021-08-18 ENCOUNTER — Telehealth (HOSPITAL_COMMUNITY): Payer: Self-pay

## 2021-08-18 NOTE — Telephone Encounter (Signed)
Transitions of Care Pharmacy  ? ?Call attempted for a pharmacy transitions of care follow-up.   ? ?Call attempt #2. Will follow-up in 2-3 days.  ?  ? ?Jiles Crocker, PharmD ?Clinical Pharmacist ?[facility] ?08/18/2021 3:41 PM ? ?

## 2021-08-20 ENCOUNTER — Encounter: Payer: Self-pay | Admitting: General Practice

## 2021-08-30 ENCOUNTER — Telehealth (HOSPITAL_BASED_OUTPATIENT_CLINIC_OR_DEPARTMENT_OTHER): Payer: Self-pay

## 2021-08-30 ENCOUNTER — Other Ambulatory Visit (HOSPITAL_BASED_OUTPATIENT_CLINIC_OR_DEPARTMENT_OTHER): Payer: Self-pay

## 2021-08-30 NOTE — Telephone Encounter (Signed)
Transitions of Care Pharmacy   Call attempted for a pharmacy transitions of care follow-up. HIPAA appropriate voicemail was left with call back information provided.   Call attempt #3. Final attempt.  Jiles Crocker, PharmD Clinical Pharmacist [facility] 08/30/2021 3:23 PM

## 2021-08-31 ENCOUNTER — Telehealth: Payer: Self-pay | Admitting: General Practice

## 2021-08-31 NOTE — Telephone Encounter (Signed)
Called pt to verify address due to getting return mail for the pt. The number on file is a business and the pt doesn't work there. Unable to contact pt to update address.

## 2021-10-05 ENCOUNTER — Other Ambulatory Visit: Payer: Self-pay | Admitting: Adult Health

## 2021-10-07 ENCOUNTER — Emergency Department (HOSPITAL_COMMUNITY): Payer: Medicaid Other

## 2021-10-07 ENCOUNTER — Encounter (HOSPITAL_COMMUNITY): Payer: Self-pay | Admitting: Emergency Medicine

## 2021-10-07 ENCOUNTER — Other Ambulatory Visit: Payer: Self-pay

## 2021-10-07 ENCOUNTER — Emergency Department (HOSPITAL_COMMUNITY)
Admission: EM | Admit: 2021-10-07 | Discharge: 2021-10-07 | Disposition: A | Discharge status: Home / Self Care | Payer: Medicaid Other | Attending: Emergency Medicine | Admitting: Emergency Medicine

## 2021-10-07 DIAGNOSIS — R600 Localized edema: Secondary | ICD-10-CM | POA: Diagnosis not present

## 2021-10-07 DIAGNOSIS — Z7901 Long term (current) use of anticoagulants: Secondary | ICD-10-CM | POA: Diagnosis not present

## 2021-10-07 DIAGNOSIS — Z7982 Long term (current) use of aspirin: Secondary | ICD-10-CM | POA: Diagnosis not present

## 2021-10-07 DIAGNOSIS — M7989 Other specified soft tissue disorders: Secondary | ICD-10-CM | POA: Diagnosis present

## 2021-10-07 DIAGNOSIS — R609 Edema, unspecified: Secondary | ICD-10-CM

## 2021-10-07 LAB — CBC WITH DIFFERENTIAL/PLATELET
Abs Immature Granulocytes: 0.03 10*3/uL (ref 0.00–0.07)
Basophils Absolute: 0.1 10*3/uL (ref 0.0–0.1)
Basophils Relative: 1 %
Eosinophils Absolute: 0.1 10*3/uL (ref 0.0–0.5)
Eosinophils Relative: 1 %
HCT: 49.5 % (ref 39.0–52.0)
Hemoglobin: 15.8 g/dL (ref 13.0–17.0)
Immature Granulocytes: 0 %
Lymphocytes Relative: 18 %
Lymphs Abs: 1.6 10*3/uL (ref 0.7–4.0)
MCH: 28.5 pg (ref 26.0–34.0)
MCHC: 31.9 g/dL (ref 30.0–36.0)
MCV: 89.4 fL (ref 80.0–100.0)
Monocytes Absolute: 0.9 10*3/uL (ref 0.1–1.0)
Monocytes Relative: 10 %
Neutro Abs: 6.3 10*3/uL (ref 1.7–7.7)
Neutrophils Relative %: 70 %
Platelets: 283 10*3/uL (ref 150–400)
RBC: 5.54 MIL/uL (ref 4.22–5.81)
RDW: 14.6 % (ref 11.5–15.5)
WBC: 8.9 10*3/uL (ref 4.0–10.5)
nRBC: 0 % (ref 0.0–0.2)

## 2021-10-07 LAB — URINALYSIS, ROUTINE W REFLEX MICROSCOPIC
Bacteria, UA: NONE SEEN
Bilirubin Urine: NEGATIVE
Glucose, UA: NEGATIVE mg/dL
Ketones, ur: NEGATIVE mg/dL
Leukocytes,Ua: NEGATIVE
Nitrite: NEGATIVE
Protein, ur: >=300 mg/dL — AB
Specific Gravity, Urine: 1.022 (ref 1.005–1.030)
pH: 5 (ref 5.0–8.0)

## 2021-10-07 LAB — TROPONIN I (HIGH SENSITIVITY): Troponin I (High Sensitivity): 39 ng/L — ABNORMAL HIGH (ref <18)

## 2021-10-07 LAB — BASIC METABOLIC PANEL
Anion gap: 11 (ref 5–15)
BUN: 23 mg/dL (ref 8–23)
CO2: 25 mmol/L (ref 22–32)
Calcium: 9.6 mg/dL (ref 8.9–10.3)
Chloride: 97 mmol/L — ABNORMAL LOW (ref 98–111)
Creatinine, Ser: 1.16 mg/dL (ref 0.61–1.24)
GFR, Estimated: >60 mL/min (ref >60)
Glucose, Bld: 127 mg/dL — ABNORMAL HIGH (ref 70–99)
Potassium: 5 mmol/L (ref 3.5–5.1)
Sodium: 133 mmol/L — ABNORMAL LOW (ref 135–145)

## 2021-10-07 LAB — BRAIN NATRIURETIC PEPTIDE: B Natriuretic Peptide: 787.8 pg/mL — ABNORMAL HIGH (ref 0.0–100.0)

## 2021-10-07 MED ORDER — FENTANYL CITRATE PF 50 MCG/ML IJ SOSY
100.0000 ug | PREFILLED_SYRINGE | Freq: Once | INTRAMUSCULAR | Status: AC
  Administered 2021-10-07: 100 ug via INTRAVENOUS
  Filled 2021-10-07: qty 2

## 2021-10-07 MED ORDER — OXYCODONE-ACETAMINOPHEN 5-325 MG PO TABS: 1.0000 | ORAL_TABLET | Freq: Three times a day (TID) | ORAL | 0 refills | Status: DC | PRN

## 2021-10-07 MED ORDER — FUROSEMIDE 10 MG/ML IJ SOLN
80.0000 mg | Freq: Once | INTRAMUSCULAR | Status: AC
  Administered 2021-10-07: 80 mg via INTRAVENOUS
  Filled 2021-10-07: qty 8

## 2021-10-07 NOTE — Discharge Instructions (Addendum)
Double your fluid pill for the next week then return to once a day. Follow up with your cardiologist in 1-2 weeks for recheck of symptoms.

## 2021-10-07 NOTE — ED Triage Notes (Signed)
Patient arrived with EMS from home reports worsening bilateral knee pain with swelling onset 3 days ago , denies injury or fever .

## 2021-10-07 NOTE — ED Provider Notes (Signed)
[facility] EMERGENCY DEPARTMENT Provider Note   CSN: [id number] Arrival date & time: 10/07/21  0231     History  Chief Complaint  Patient presents with   Knee Pain / Swelling    Allen Mayer is a 61 y.o. male.  61 yo M here with pain and swelling around knees bilaterally. States he can't walk because it is painful in those areas. States he has been taking his medications but isn't sure what they are, what the dosages are or any other specifics. No fevers. No sob. No chest pain. No trauma.         Home Medications Prior to Admission medications   Medication Sig Start Date End Date Taking? Authorizing Provider  acetaminophen (TYLENOL) 500 MG tablet Take 1 tablet (500 mg total) by mouth every 6 (six) hours as needed. 08/13/21   Domenic Moras, PA-C  albuterol (VENTOLIN HFA) 108 (90 Base) MCG/ACT inhaler Inhale 1-2 puffs into the lungs every 6 (six) hours as needed for wheezing or shortness of breath. 07/26/21   Azucena Cecil, PA-C  aspirin 81 MG EC tablet Take 1 tablet (81 mg total) by mouth daily. Swallow whole. 08/03/21   Barrett, Evelene Croon, PA-C  atorvastatin (LIPITOR) 80 MG tablet Take 1 tablet (80 mg total) by mouth daily. 08/03/21   Barrett, Evelene Croon, PA-C  carvedilol (COREG) 6.25 MG tablet Take 1 tablet (6.25 mg total) by mouth 2 (two) times daily. 10/06/21   Minus Breeding, MD  citalopram (CELEXA) 20 MG tablet Take 1 tablet (20 mg total) by mouth daily. 05/22/20   Lendon Colonel, NP  cyclobenzaprine (FLEXERIL) 10 MG tablet Take 1 tablet (10 mg total) by mouth 2 (two) times daily as needed for muscle spasms. 08/13/21   Domenic Moras, PA-C  FLOVENT HFA 110 MCG/ACT inhaler Inhale 2 puffs into the lungs 2 (two) times daily. 12/04/19   Minus Breeding, MD  gabapentin (NEURONTIN) 400 MG capsule Take 1 capsule (400 mg total) by mouth daily as needed (nerve pain). 05/22/20   Lendon Colonel, NP  hydrALAZINE (APRESOLINE) 25 MG tablet Take 1 tablet (25 mg total)  by mouth every 8 (eight) hours. 08/03/21   Barrett, Evelene Croon, PA-C  nitroGLYCERIN (NITROSTAT) 0.4 MG SL tablet Place 1 tablet (0.4 mg total) under the tongue every 5 (five) minutes as needed for chest pain. 08/03/21   Barrett, Evelene Croon, PA-C  rivaroxaban (XARELTO) 20 MG TABS tablet Take 1 tablet (20 mg total) by mouth daily with supper. 08/03/21   Barrett, Evelene Croon, PA-C  torsemide (DEMADEX) 20 MG tablet Take 1 tablet (20 mg total) by mouth daily. Take an extra tablet daily as needed for edema or weight gain 08/03/21   Barrett, Evelene Croon, PA-C      Allergies    Patient has no known allergies.    Review of Systems   Review of Systems  Physical Exam Updated Vital Signs BP (!) 116/94   Pulse 97   Temp 98 F (36.7 C) (Oral)   Resp 20   SpO2 97%  Physical Exam Vitals and nursing note reviewed.  Constitutional:      Appearance: He is well-developed.  HENT:     Head: Normocephalic and atraumatic.     Mouth/Throat:     Mouth: Mucous membranes are moist.     Pharynx: Oropharynx is clear.  Eyes:     Pupils: Pupils are equal, round, and reactive to light.  Cardiovascular:     Rate and Rhythm:  Normal rate.  Pulmonary:     Effort: Pulmonary effort is normal. No respiratory distress.  Abdominal:     General: Abdomen is flat. There is no distension.  Musculoskeletal:        General: Normal range of motion.     Cervical back: Normal range of motion.     Comments: Mild suprapetalla bursa edema, no erythema, warmth, ttp or induration.   Skin:    General: Skin is warm and dry.  Neurological:     General: No focal deficit present.     Mental Status: He is alert.     ED Results / Procedures / Treatments   Labs (all labs ordered are listed, but only abnormal results are displayed) Labs Reviewed  BASIC METABOLIC PANEL - Abnormal; Notable for the following components:      Result Value   Sodium 133 (*)    Chloride 97 (*)    Glucose, Bld 127 (*)    All other components within normal  limits  URINALYSIS, ROUTINE W REFLEX MICROSCOPIC - Abnormal; Notable for the following components:   Color, Urine AMBER (*)    Hgb urine dipstick SMALL (*)    Protein, ur >=300 (*)    All other components within normal limits  BRAIN NATRIURETIC PEPTIDE - Abnormal; Notable for the following components:   B Natriuretic Peptide 787.8 (*)    All other components within normal limits  TROPONIN I (HIGH SENSITIVITY) - Abnormal; Notable for the following components:   Troponin I (High Sensitivity) 39 (*)    All other components within normal limits  CBC WITH DIFFERENTIAL/PLATELET    EKG None  Radiology DG Chest Portable 1 View  Result Date: 10/07/2021 CLINICAL DATA:  61 year old male with lower extremity swelling. EXAM: PORTABLE CHEST 1 VIEW COMPARISON:  Chest radiographs 08/13/2021 and earlier. FINDINGS: Portable AP semi upright view at 0541 hours. Moderate cardiomegaly appears stable to mildly improved from last month, possibly due to regressed pericardial effusion. Other mediastinal contours are within normal limits. Visualized tracheal air column is within normal limits. Allowing for portable technique the lungs are clear. No pneumothorax, pulmonary edema, or pleural effusion. No acute osseous abnormality identified. Negative visible bowel gas. IMPRESSION: Cardiomegaly appears stable to improved from last month. No acute cardiopulmonary abnormality. Electronically Signed   By: Odessa Fleming M.D.   On: 10/07/2021 05:52   DG Knee Complete 4 Views Right  Result Date: 10/07/2021 CLINICAL DATA:  Swelling and pain in both knees. EXAM: RIGHT KNEE - COMPLETE 4+ VIEW; LEFT KNEE - COMPLETE 4+ VIEW COMPARISON:  Right knee series 10/14/2020. No prior left knee for comparison. FINDINGS: Right knee: A moderate-to-large suprapatellar bursal effusion has developed. There is mild tricompartmental joint space narrowing and small tricompartmental marginal osteophytes. No loose body or radiopaque foreign body is seen.  There is artifact from overlying clothing. There is no fracture or destructive suspicious bone lesion. There are atherosclerotic calcifications. Only interval change is development of the suprapatellar fluid. Left knee: There is a large suprapatellar bursal effusion. No acute fracture is evident. The bone mineralization is normal. There is a cluster of dystrophic ossific bodies alongside the anterior tibial tubercle which could be due to remote trauma or childhood Osgood-Schlatter disease. There is mild to moderate tricompartmental joint space loss and mild tricompartmental marginal osteophytosis. No erosive arthropathy is seen. No loose body is evident. There is no radiopaque foreign body. There is artifact from overlying clothing. IMPRESSION: 1. Bilateral suprapatellar bursal effusions larger on the left. 2.  Degenerative changes without evidence of fractures with joint space loss greater on the left. 3. Clustered ossicles about the left anterior tibial tubercle consistent with remote trauma or Osgood-Schlatter disease in childhood. Electronically Signed   By: Almira Bar M.D.   On: 10/07/2021 03:24   DG Knee Complete 4 Views Left  Result Date: 10/07/2021 CLINICAL DATA:  Swelling and pain in both knees. EXAM: RIGHT KNEE - COMPLETE 4+ VIEW; LEFT KNEE - COMPLETE 4+ VIEW COMPARISON:  Right knee series 10/14/2020. No prior left knee for comparison. FINDINGS: Right knee: A moderate-to-large suprapatellar bursal effusion has developed. There is mild tricompartmental joint space narrowing and small tricompartmental marginal osteophytes. No loose body or radiopaque foreign body is seen. There is artifact from overlying clothing. There is no fracture or destructive suspicious bone lesion. There are atherosclerotic calcifications. Only interval change is development of the suprapatellar fluid. Left knee: There is a large suprapatellar bursal effusion. No acute fracture is evident. The bone mineralization is normal.  There is a cluster of dystrophic ossific bodies alongside the anterior tibial tubercle which could be due to remote trauma or childhood Osgood-Schlatter disease. There is mild to moderate tricompartmental joint space loss and mild tricompartmental marginal osteophytosis. No erosive arthropathy is seen. No loose body is evident. There is no radiopaque foreign body. There is artifact from overlying clothing. IMPRESSION: 1. Bilateral suprapatellar bursal effusions larger on the left. 2. Degenerative changes without evidence of fractures with joint space loss greater on the left. 3. Clustered ossicles about the left anterior tibial tubercle consistent with remote trauma or Osgood-Schlatter disease in childhood. Electronically Signed   By: Almira Bar M.D.   On: 10/07/2021 03:24    Procedures Procedures    Medications Ordered in ED Medications  furosemide (LASIX) injection 80 mg (80 mg Intravenous Given 10/07/21 0637)  fentaNYL (SUBLIMAZE) injection 100 mcg (100 mcg Intravenous Given 10/07/21 1856)    ED Course/ Medical Decision Making/ A&P                           Medical Decision Making Amount and/or Complexity of Data Reviewed Labs: ordered. Radiology: ordered.  Risk Prescription drug management.   Mild BLE edema. No reason why this should make it difficult for him to walk. Will check cxr, bnp/troponin/renal function.   Renal function ok. Xr viewed by myself without significant edema. Pending BNP. Renal function baseline. Will diurese and reassess.   Diuresed over a liter. Edema improving. Urine clearing up. Pain improved. Still some pain with walking 2/2 edema, will d/c w/ meds for same and pcp follow up.  Troponin near baseline. BNP improving. No indication for admission. Will continue increased diuresis at home with cardiology follo wup.    Final Clinical Impression(s) / ED Diagnoses Final diagnoses:  None    Rx / DC Orders ED Discharge Orders     None         Machele Deihl,  Barbara Cower, MD 10/07/21 [phone]

## 2021-10-13 ENCOUNTER — Other Ambulatory Visit: Payer: Self-pay

## 2021-10-13 ENCOUNTER — Emergency Department (HOSPITAL_COMMUNITY)
Admission: EM | Admit: 2021-10-13 | Discharge: 2021-10-13 | Disposition: A | Discharge status: Home / Self Care | Payer: Medicaid Other | Attending: Emergency Medicine | Admitting: Emergency Medicine

## 2021-10-13 ENCOUNTER — Emergency Department (HOSPITAL_COMMUNITY): Payer: Medicaid Other

## 2021-10-13 DIAGNOSIS — R600 Localized edema: Secondary | ICD-10-CM | POA: Insufficient documentation

## 2021-10-13 DIAGNOSIS — Z7901 Long term (current) use of anticoagulants: Secondary | ICD-10-CM | POA: Diagnosis not present

## 2021-10-13 DIAGNOSIS — R059 Cough, unspecified: Secondary | ICD-10-CM | POA: Diagnosis not present

## 2021-10-13 DIAGNOSIS — J449 Chronic obstructive pulmonary disease, unspecified: Secondary | ICD-10-CM | POA: Diagnosis not present

## 2021-10-13 DIAGNOSIS — M7989 Other specified soft tissue disorders: Secondary | ICD-10-CM | POA: Diagnosis not present

## 2021-10-13 DIAGNOSIS — R000 Tachycardia, unspecified: Secondary | ICD-10-CM | POA: Insufficient documentation

## 2021-10-13 DIAGNOSIS — R778 Other specified abnormalities of plasma proteins: Secondary | ICD-10-CM | POA: Diagnosis not present

## 2021-10-13 DIAGNOSIS — Z7982 Long term (current) use of aspirin: Secondary | ICD-10-CM | POA: Diagnosis not present

## 2021-10-13 DIAGNOSIS — R062 Wheezing: Secondary | ICD-10-CM | POA: Diagnosis not present

## 2021-10-13 DIAGNOSIS — I2510 Atherosclerotic heart disease of native coronary artery without angina pectoris: Secondary | ICD-10-CM | POA: Diagnosis not present

## 2021-10-13 DIAGNOSIS — Z7951 Long term (current) use of inhaled steroids: Secondary | ICD-10-CM | POA: Diagnosis not present

## 2021-10-13 DIAGNOSIS — R079 Chest pain, unspecified: Secondary | ICD-10-CM | POA: Insufficient documentation

## 2021-10-13 DIAGNOSIS — I10 Essential (primary) hypertension: Secondary | ICD-10-CM | POA: Insufficient documentation

## 2021-10-13 DIAGNOSIS — R0602 Shortness of breath: Secondary | ICD-10-CM | POA: Diagnosis not present

## 2021-10-13 DIAGNOSIS — Z5900 Homelessness unspecified: Secondary | ICD-10-CM | POA: Diagnosis not present

## 2021-10-13 DIAGNOSIS — Z79899 Other long term (current) drug therapy: Secondary | ICD-10-CM | POA: Insufficient documentation

## 2021-10-13 LAB — CBC WITH DIFFERENTIAL/PLATELET
Abs Immature Granulocytes: 0.03 10*3/uL (ref 0.00–0.07)
Basophils Absolute: 0.1 10*3/uL (ref 0.0–0.1)
Basophils Relative: 2 %
Eosinophils Absolute: 0.2 10*3/uL (ref 0.0–0.5)
Eosinophils Relative: 4 %
HCT: 41.5 % (ref 39.0–52.0)
Hemoglobin: 13.4 g/dL (ref 13.0–17.0)
Immature Granulocytes: 1 %
Lymphocytes Relative: 26 %
Lymphs Abs: 1.6 10*3/uL (ref 0.7–4.0)
MCH: 28.9 pg (ref 26.0–34.0)
MCHC: 32.3 g/dL (ref 30.0–36.0)
MCV: 89.4 fL (ref 80.0–100.0)
Monocytes Absolute: 0.5 10*3/uL (ref 0.1–1.0)
Monocytes Relative: 9 %
Neutro Abs: 3.7 10*3/uL (ref 1.7–7.7)
Neutrophils Relative %: 58 %
Platelets: 293 10*3/uL (ref 150–400)
RBC: 4.64 MIL/uL (ref 4.22–5.81)
RDW: 14.3 % (ref 11.5–15.5)
WBC: 6.2 10*3/uL (ref 4.0–10.5)
nRBC: 0 % (ref 0.0–0.2)

## 2021-10-13 LAB — COMPREHENSIVE METABOLIC PANEL
ALT: 22 U/L (ref 0–44)
AST: 24 U/L (ref 15–41)
Albumin: 2.7 g/dL — ABNORMAL LOW (ref 3.5–5.0)
Alkaline Phosphatase: 80 U/L (ref 38–126)
Anion gap: 10 (ref 5–15)
BUN: 18 mg/dL (ref 8–23)
CO2: 24 mmol/L (ref 22–32)
Calcium: 9 mg/dL (ref 8.9–10.3)
Chloride: 102 mmol/L (ref 98–111)
Creatinine, Ser: 0.74 mg/dL (ref 0.61–1.24)
GFR, Estimated: >60 mL/min (ref >60)
Glucose, Bld: 107 mg/dL — ABNORMAL HIGH (ref 70–99)
Potassium: 3.7 mmol/L (ref 3.5–5.1)
Sodium: 136 mmol/L (ref 135–145)
Total Bilirubin: 0.5 mg/dL (ref 0.3–1.2)
Total Protein: 6.5 g/dL (ref 6.5–8.1)

## 2021-10-13 LAB — TROPONIN I (HIGH SENSITIVITY)
Troponin I (High Sensitivity): 151 ng/L (ref <18)
Troponin I (High Sensitivity): 158 ng/L (ref <18)

## 2021-10-13 LAB — PROTIME-INR
INR: 1.1 (ref 0.8–1.2)
Prothrombin Time: 13.8 seconds (ref 11.4–15.2)

## 2021-10-13 LAB — BRAIN NATRIURETIC PEPTIDE: B Natriuretic Peptide: 1451.1 pg/mL — ABNORMAL HIGH (ref 0.0–100.0)

## 2021-10-13 MED ORDER — NITROGLYCERIN 0.4 MG SL SUBL
0.4000 mg | SUBLINGUAL_TABLET | SUBLINGUAL | Status: DC | PRN
  Administered 2021-10-13: 0.4 mg via SUBLINGUAL
  Filled 2021-10-13: qty 1

## 2021-10-13 MED ORDER — IPRATROPIUM-ALBUTEROL 0.5-2.5 (3) MG/3ML IN SOLN
3.0000 mL | Freq: Once | RESPIRATORY_TRACT | Status: AC
  Administered 2021-10-13: 3 mL via RESPIRATORY_TRACT
  Filled 2021-10-13: qty 3

## 2021-10-13 MED ORDER — FUROSEMIDE 10 MG/ML IJ SOLN
20.0000 mg | Freq: Once | INTRAMUSCULAR | Status: AC
  Administered 2021-10-13: 20 mg via INTRAVENOUS
  Filled 2021-10-13: qty 2

## 2021-10-13 NOTE — ED Provider Notes (Signed)
[facility] EMERGENCY DEPARTMENT Provider Note   CSN: [id number] Arrival date & time: 10/13/21  0346     History  Chief Complaint  Patient presents with   Chest Pain   Leg Swelling    Allen Mayer is a 61 y.o. 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  In April Patient underwent extensive cardiac work-up with right heart cath which revealed mild nonobstructive CAD.  At that time he also had an echocardiogram which revealed large mural thrombus of the apex and separate thrombus associated with the posterior mitral leaflet mitral, sure with EF 20 to 25% and global left ventricular hypokinesis.  Discharged on Xarelto.  Patient lost to follow-up after discharge due to homelessness and difficulty with transportation.  Tonight he states that his chest pain is centralized without radiation to his arms and associated with burning sensation and severe pressure.  Received 2 nitro in route with EMS as well as aspirin with significant improvement in his chest pain and there is some persistence.  Additionally endorses bilateral lower extremity leg pain which she states is chronic and similar to prior without acute exacerbation.  Last cocaine usage 4 days ago (Saturday 10/09/21) per patient.   Some associated shortness of breath and cough the patient does have COPD with chronic tobacco use.  He states that his last dose of Xarelto was approximately 36 hours ago.  I personally read this patient's medical records.  In addition to the above listed he has history of polysubstance abuse, he is diuresed on torsemide daily unsure when his last dose was.  HPI     Home Medications Prior to Admission medications   Medication Sig Start Date End Date Taking? Authorizing Provider  acetaminophen (TYLENOL) 500 MG tablet Take 1 tablet (500 mg total) by mouth every 6 (six) hours as needed. 08/13/21    Fayrene Helper, PA-C  albuterol (VENTOLIN HFA) 108 (90 Base) MCG/ACT inhaler Inhale 1-2 puffs into the lungs every 6 (six) hours as needed for wheezing or shortness of breath. 07/26/21   Al Decant, PA-C  aspirin 81 MG EC tablet Take 1 tablet (81 mg total) by mouth daily. Swallow whole. 08/03/21   Barrett, Joline Salt, PA-C  atorvastatin (LIPITOR) 80 MG tablet Take 1 tablet (80 mg total) by mouth daily. 08/03/21   Barrett, Joline Salt, PA-C  carvedilol (COREG) 6.25 MG tablet Take 1 tablet (6.25 mg total) by mouth 2 (two) times daily. 10/06/21   Rollene Rotunda, MD  citalopram (CELEXA) 20 MG tablet Take 1 tablet (20 mg total) by mouth daily. 05/22/20   Jodelle Gross, NP  cyclobenzaprine (FLEXERIL) 10 MG tablet Take 1 tablet (10 mg total) by mouth 2 (two) times daily as needed for muscle spasms. 08/13/21   Fayrene Helper, PA-C  FLOVENT HFA 110 MCG/ACT inhaler Inhale 2 puffs into the lungs 2 (two) times daily. 12/04/19   Rollene Rotunda, MD  gabapentin (NEURONTIN) 400 MG capsule Take 1 capsule (400 mg total) by mouth daily as needed (nerve pain). 05/22/20   Jodelle Gross, NP  hydrALAZINE (APRESOLINE) 25 MG tablet Take 1 tablet (25 mg total) by mouth every 8 (eight) hours. 08/03/21   Barrett, Joline Salt, PA-C  nitroGLYCERIN (NITROSTAT) 0.4 MG SL tablet Place 1 tablet (0.4 mg total) under the tongue every 5 (five) minutes as needed for chest pain. 08/03/21   Barrett, Joline Salt, PA-C  oxyCODONE-acetaminophen (  PERCOCET) 5-325 MG tablet Take 1 tablet by mouth every 8 (eight) hours as needed. 10/07/21   Mesner, Barbara Cower, MD  rivaroxaban (XARELTO) 20 MG TABS tablet Take 1 tablet (20 mg total) by mouth daily with supper. 08/03/21   Barrett, Joline Salt, PA-C  torsemide (DEMADEX) 20 MG tablet Take 1 tablet (20 mg total) by mouth daily. Take an extra tablet daily as needed for edema or weight gain 08/03/21   Barrett, Joline Salt, PA-C      Allergies    Patient has no known allergies.    Review of Systems   Review of Systems   Constitutional: Negative.   HENT: Negative.    Eyes: Negative.   Respiratory:  Positive for shortness of breath.   Cardiovascular:  Positive for chest pain and leg swelling.  Gastrointestinal: Negative.   Endocrine: Negative.   Genitourinary: Negative.   Musculoskeletal: Negative.   Skin: Negative.   Neurological: Negative.   Hematological: Negative.     Physical Exam Updated Vital Signs BP (!) 140/104   Pulse 98   Temp 98.3 F (36.8 C) (Oral)   Resp (!) 22   Ht 6' (1.829 m)   Wt 81.6 kg   SpO2 99%   BMI 24.41 kg/m  Physical Exam Vitals and nursing note reviewed.  Constitutional:      Appearance: He is not ill-appearing or toxic-appearing.  HENT:     Head: Normocephalic and atraumatic.     Mouth/Throat:     Mouth: Mucous membranes are moist.     Pharynx: No oropharyngeal exudate or posterior oropharyngeal erythema.  Eyes:     General:        Right eye: No discharge.        Left eye: No discharge.     Conjunctiva/sclera: Conjunctivae normal.  Cardiovascular:     Rate and Rhythm: Normal rate and regular rhythm.     Pulses: Normal pulses.     Heart sounds: Normal heart sounds.  Pulmonary:     Effort: Pulmonary effort is normal. No respiratory distress.     Breath sounds: Examination of the left-middle field reveals wheezing. Examination of the right-lower field reveals wheezing. Examination of the left-lower field reveals wheezing. Wheezing present. No rales.  Chest:     Chest wall: No mass, tenderness or edema.  Abdominal:     General: Bowel sounds are normal. There is no distension.     Tenderness: There is no abdominal tenderness.  Musculoskeletal:        General: No deformity.     Cervical back: Neck supple.     Right lower leg: 1+ Edema present.     Left lower leg: 1+ Edema present.  Skin:    General: Skin is warm and dry.     Capillary Refill: Capillary refill takes less than 2 seconds.  Neurological:     General: No focal deficit present.     Mental  Status: He is alert and oriented to person, place, and time. Mental status is at baseline.  Psychiatric:        Mood and Affect: Mood normal.     ED Results / Procedures / Treatments   Labs (all labs ordered are listed, but only abnormal results are displayed) Labs Reviewed  COMPREHENSIVE METABOLIC PANEL - Abnormal; Notable for the following components:      Result Value   Glucose, Bld 107 (*)    Albumin 2.7 (*)    All other components within normal limits  TROPONIN I (  HIGH SENSITIVITY) - Abnormal; Notable for the following components:   Troponin I (High Sensitivity) 151 (*)    All other components within normal limits  CBC WITH DIFFERENTIAL/PLATELET  PROTIME-INR  BRAIN NATRIURETIC PEPTIDE    EKG EKG Interpretation  Date/Time:  Wednesday October 13 2021 03:59:26 EDT Ventricular Rate:  100 PR Interval:  204 QRS Duration: 101 QT Interval:  380 QTC Calculation: 491 R Axis:   -73 Text Interpretation: Sinus tachycardia RSR' in V1 or V2, right VCD or RVH LVH with secondary repolarization abnormality Inferior infarct, old No significant change since last tracing Confirmed by Zadie Rhine ([id number]) on 10/13/2021 4:12:13 AM  Radiology DG Chest 2 View  Result Date: 10/13/2021 CLINICAL DATA:  Chest pain, leg swelling. EXAM: CHEST - 2 VIEW COMPARISON:  10/07/2021 FINDINGS: The heart is enlarged the mediastinal contour stable. The pulmonary vasculature is within normal limits. Lung volumes are low mild atelectasis is present at the right lung base. No effusion or pneumothorax. Degenerative changes are present in the thoracic spine. IMPRESSION: 1. Cardiomegaly. 2. Mild atelectasis at the right lung base. Electronically Signed   By: Thornell Sartorius M.D.   On: 10/13/2021 04:32    Procedures Procedures    Medications Ordered in ED Medications  ipratropium-albuterol (DUONEB) 0.5-2.5 (3) MG/3ML nebulizer solution 3 mL (3 mLs Nebulization Given 10/13/21 0441)    ED Course/ Medical Decision  Making/ A&P Clinical Course as of 10/13/21 0520  Wed Oct 13, 2021  0503 Per RN, patient with troponin of 151.  [RS]    Clinical Course User Index [RS] Christyann Manolis, Eugene Gavia, PA-C                           Medical Decision Making [id number] who presents with concern for chest pain that woke him from his sleep tonight.  History of  NSTEMI in April of this year.  Hypertensive on intake, mildly tachycardic, vital signs otherwise normal.  Cardiopulmonary exam with mild wheezing in the bases but otherwise unremarkable with regular rate and rhythm.  1+ bilateral lower extremity edema without calf tenderness palpation or pitting. Differential diagnosis includes limited to ACS, PE, pleural effusion, CHF, pneumonia, pneumothorax .    Amount and/or Complexity of Data Reviewed Labs: ordered.    Details: CBC unremarkable, CMP unremarkable, INR is normal, last dose of Xarelto 36 hours ago.  Troponin elevated to 151, delta troponin pending.  BNP is significantly elevated to 1450 Lasix administered. Radiology: ordered.  Risk Prescription drug management.    Cardiology paged x2 for recommendations regarding indication for heparinization and disposition planning for this patient with poor follow-up, however no return call prior to shift change.  Patient reevaluated with very mild chest pain 3/10 at this time requesting nitro which was administered with improvement in his pain  Care of this patient signed out to oncoming ED provider Sam Petrucelli, PA-C at time of shift change.  All pertinent HPI, physical exam, laboratory findings were discussed with her prior to my partner.  Dispo pending cardiology consultation.  I appreciate her collaboration in care of this patient.  Audy voiced understanding of his medical evaluation and treatment plan. Each of their questions answered to their expressed satisfaction.    This chart was dictated using voice recognition software, Dragon. Despite the best efforts of  this provider to proofread and correct errors, errors may still occur which can change documentation meaning.    Final Clinical Impression(s) / ED Diagnoses Final diagnoses:  None    Rx / DC Orders ED Discharge Orders     None         Sherrilee Gilles 10/13/21 0701    Zadie Rhine, MD 10/13/21 [phone]

## 2021-10-13 NOTE — ED Provider Notes (Signed)
07:00: Assumed care of patient from PA Sponseller at shift change pending delta troponin and consult to cardiology.  Please see prior provider note for full H&P. Briefly patient is a 61 year old male with a history of  61 yo male who presented to the emergency department with complaints of chest pain.  Patient had recent admission in April 2023 for what was suspected to be a cocaine induced NSTEMI, found to have mild nonobstructive CAD on cardiac catheterization, found to also have dilated cardiomyopathy with an EF of 20 to 25% and a left mural thrombus.  Currently on Xarelto.  He most recently used cocaine 3 days prior.  On my reassessment at 7:40 patient is chest pain-free resting comfortably.  Repeat troponin 158, slightly increased from prior but relatively flat, given elevation will discuss w/ cardiology.   08:05: CONSULT: I discussed with cardiologist Dr. [place], given no significant delta elevation and recent cardiac catheterization no further intervention from a cardiology standpoint.  We will discharge home with recommendation to avoid cocaine use and follow-up outpatient. I discussed results, treatment plan, need for follow-up, and return precautions with the patient. Provided opportunity for questions, patient confirmed understanding and is in agreement with plan.   At time of discharge patient now requesting crutches.  States has been having bilateral knee pain and swelling for the past week or so, worse in the left knee.  States the right knee has improved.  He was seen in the ED for similar 10/07/2021, he was aggressively diuresed in the ED with improvement in edema and was ultimately discharged.  He had xrays of bilateral knees during his ED visit 1. Bilateral suprapatellar bursal effusions larger on the left. 2. Degenerative changes without evidence of fractures with joint space loss greater on the left. 3. Clustered ossicles about the left anterior tibial tubercle consistent  with remote trauma or Osgood-Schlatter disease in childhood.  On my current exam patient has no lower leg pitting edema.  He has a small left knee effusion present without overlying erythema/warmth or induration/fluctuance. Intact AROM throughout. NVI distally.  Will provide Ace wrap and crutches w/ orthopedics follow up information.    Desmond Lope 10/13/21 1311    Gloris Manchester, MD 10/13/21 [phone]

## 2021-10-13 NOTE — ED Triage Notes (Signed)
Pt arrived via [facility] for cc of chest pain and bilateral leg swelling/pain. Pt reported waking up to burning, center chest pain approximately 30 minutes ago. Pt  at [facility] ED last week similar leg pain complaint last week.   EMS administered 2 SL Nitro with mild decrease in chest pain and 324 ASA. 18g LFA   PTA Vitals   BP 136/90 HR 100 SPO2 96% RR 20

## 2021-10-13 NOTE — Discharge Instructions (Addendum)
You were seen in the emergency department today for chest pain. Your blood work showed that your albumin which is a protein level was mildly low, your heart enzymes were somewhat elevated however similar when checked twice.  We would like you to take all of your medication as prescribed.  Please avoid cocaine use.  Please follow-up with cardiology.  Return to the emergency department for any new or worsening symptoms or any other concerns.   In terms of your knee pain and swelling please utilize the Ace wrap for compression, keep the knee elevated when ever possible, apply ice wrapped in a towel 20 minutes on 40 minutes off over the next 48 hours, and please follow-up with orthopedics.

## 2021-10-15 ENCOUNTER — Other Ambulatory Visit: Payer: Self-pay | Admitting: Cardiology

## 2021-11-01 ENCOUNTER — Other Ambulatory Visit: Payer: Self-pay

## 2021-11-01 ENCOUNTER — Emergency Department (HOSPITAL_COMMUNITY): Payer: Medicaid Other

## 2021-11-01 ENCOUNTER — Inpatient Hospital Stay (HOSPITAL_COMMUNITY)
Admission: EM | Admit: 2021-11-01 | Discharge: 2021-11-04 | DRG: 917 | Disposition: A | Discharge status: Home / Self Care | Payer: Medicaid Other | Attending: Internal Medicine | Admitting: Internal Medicine

## 2021-11-01 DIAGNOSIS — I2729 Other secondary pulmonary hypertension: Secondary | ICD-10-CM | POA: Diagnosis present

## 2021-11-01 DIAGNOSIS — F14129 Cocaine abuse with intoxication, unspecified: Secondary | ICD-10-CM | POA: Diagnosis present

## 2021-11-01 DIAGNOSIS — I214 Non-ST elevation (NSTEMI) myocardial infarction: Secondary | ICD-10-CM | POA: Diagnosis present

## 2021-11-01 DIAGNOSIS — I2510 Atherosclerotic heart disease of native coronary artery without angina pectoris: Secondary | ICD-10-CM | POA: Diagnosis present

## 2021-11-01 DIAGNOSIS — Z20822 Contact with and (suspected) exposure to covid-19: Secondary | ICD-10-CM | POA: Diagnosis present

## 2021-11-01 DIAGNOSIS — Z7982 Long term (current) use of aspirin: Secondary | ICD-10-CM

## 2021-11-01 DIAGNOSIS — I252 Old myocardial infarction: Secondary | ICD-10-CM

## 2021-11-01 DIAGNOSIS — F1010 Alcohol abuse, uncomplicated: Secondary | ICD-10-CM | POA: Diagnosis present

## 2021-11-01 DIAGNOSIS — Z91199 Patient's noncompliance with other medical treatment and regimen due to unspecified reason: Secondary | ICD-10-CM

## 2021-11-01 DIAGNOSIS — R000 Tachycardia, unspecified: Secondary | ICD-10-CM | POA: Diagnosis present

## 2021-11-01 DIAGNOSIS — I5043 Acute on chronic combined systolic (congestive) and diastolic (congestive) heart failure: Secondary | ICD-10-CM | POA: Diagnosis present

## 2021-11-01 DIAGNOSIS — Z720 Tobacco use: Secondary | ICD-10-CM | POA: Diagnosis present

## 2021-11-01 DIAGNOSIS — Z91148 Patient's other noncompliance with medication regimen for other reason: Secondary | ICD-10-CM

## 2021-11-01 DIAGNOSIS — R079 Chest pain, unspecified: Secondary | ICD-10-CM | POA: Diagnosis present

## 2021-11-01 DIAGNOSIS — E785 Hyperlipidemia, unspecified: Secondary | ICD-10-CM | POA: Diagnosis present

## 2021-11-01 DIAGNOSIS — Y929 Unspecified place or not applicable: Secondary | ICD-10-CM

## 2021-11-01 DIAGNOSIS — Z5901 Sheltered homelessness: Secondary | ICD-10-CM

## 2021-11-01 DIAGNOSIS — F1410 Cocaine abuse, uncomplicated: Secondary | ICD-10-CM | POA: Diagnosis present

## 2021-11-01 DIAGNOSIS — M1990 Unspecified osteoarthritis, unspecified site: Secondary | ICD-10-CM | POA: Diagnosis present

## 2021-11-01 DIAGNOSIS — J449 Chronic obstructive pulmonary disease, unspecified: Secondary | ICD-10-CM | POA: Diagnosis present

## 2021-11-01 DIAGNOSIS — Z7901 Long term (current) use of anticoagulants: Secondary | ICD-10-CM

## 2021-11-01 DIAGNOSIS — I428 Other cardiomyopathies: Secondary | ICD-10-CM | POA: Diagnosis present

## 2021-11-01 DIAGNOSIS — N179 Acute kidney failure, unspecified: Secondary | ICD-10-CM | POA: Diagnosis present

## 2021-11-01 DIAGNOSIS — I444 Left anterior fascicular block: Secondary | ICD-10-CM | POA: Diagnosis present

## 2021-11-01 DIAGNOSIS — N183 Chronic kidney disease, stage 3 unspecified: Secondary | ICD-10-CM | POA: Diagnosis present

## 2021-11-01 DIAGNOSIS — K219 Gastro-esophageal reflux disease without esophagitis: Secondary | ICD-10-CM | POA: Diagnosis present

## 2021-11-01 DIAGNOSIS — I130 Hypertensive heart and chronic kidney disease with heart failure and stage 1 through stage 4 chronic kidney disease, or unspecified chronic kidney disease: Secondary | ICD-10-CM | POA: Diagnosis present

## 2021-11-01 DIAGNOSIS — T405X1A Poisoning by cocaine, accidental (unintentional), initial encounter: Principal | ICD-10-CM | POA: Diagnosis present

## 2021-11-01 DIAGNOSIS — Z79899 Other long term (current) drug therapy: Secondary | ICD-10-CM

## 2021-11-01 DIAGNOSIS — F259 Schizoaffective disorder, unspecified: Secondary | ICD-10-CM | POA: Diagnosis present

## 2021-11-01 DIAGNOSIS — F17210 Nicotine dependence, cigarettes, uncomplicated: Secondary | ICD-10-CM | POA: Diagnosis present

## 2021-11-01 DIAGNOSIS — F14188 Cocaine abuse with other cocaine-induced disorder: Secondary | ICD-10-CM | POA: Diagnosis present

## 2021-11-01 DIAGNOSIS — R778 Other specified abnormalities of plasma proteins: Secondary | ICD-10-CM | POA: Diagnosis present

## 2021-11-01 LAB — COMPREHENSIVE METABOLIC PANEL
ALT: 35 U/L (ref 0–44)
AST: 26 U/L (ref 15–41)
Albumin: 3.1 g/dL — ABNORMAL LOW (ref 3.5–5.0)
Alkaline Phosphatase: 82 U/L (ref 38–126)
Anion gap: 9 (ref 5–15)
BUN: 18 mg/dL (ref 8–23)
CO2: 24 mmol/L (ref 22–32)
Calcium: 8.5 mg/dL — ABNORMAL LOW (ref 8.9–10.3)
Chloride: 102 mmol/L (ref 98–111)
Creatinine, Ser: 0.92 mg/dL (ref 0.61–1.24)
GFR, Estimated: >60 mL/min (ref >60)
Glucose, Bld: 102 mg/dL — ABNORMAL HIGH (ref 70–99)
Potassium: 4 mmol/L (ref 3.5–5.1)
Sodium: 135 mmol/L (ref 135–145)
Total Bilirubin: 1.2 mg/dL (ref 0.3–1.2)
Total Protein: 6.9 g/dL (ref 6.5–8.1)

## 2021-11-01 LAB — CBC WITH DIFFERENTIAL/PLATELET
Abs Immature Granulocytes: 0.02 10*3/uL (ref 0.00–0.07)
Basophils Absolute: 0 10*3/uL (ref 0.0–0.1)
Basophils Relative: 1 %
Eosinophils Absolute: 0.3 10*3/uL (ref 0.0–0.5)
Eosinophils Relative: 4 %
HCT: 38.9 % — ABNORMAL LOW (ref 39.0–52.0)
Hemoglobin: 12.7 g/dL — ABNORMAL LOW (ref 13.0–17.0)
Immature Granulocytes: 0 %
Lymphocytes Relative: 20 %
Lymphs Abs: 1.5 10*3/uL (ref 0.7–4.0)
MCH: 28.5 pg (ref 26.0–34.0)
MCHC: 32.6 g/dL (ref 30.0–36.0)
MCV: 87.4 fL (ref 80.0–100.0)
Monocytes Absolute: 0.7 10*3/uL (ref 0.1–1.0)
Monocytes Relative: 9 %
Neutro Abs: 5.1 10*3/uL (ref 1.7–7.7)
Neutrophils Relative %: 66 %
Platelets: 187 10*3/uL (ref 150–400)
RBC: 4.45 MIL/uL (ref 4.22–5.81)
RDW: 14.2 % (ref 11.5–15.5)
WBC: 7.5 10*3/uL (ref 4.0–10.5)
nRBC: 0 % (ref 0.0–0.2)

## 2021-11-01 LAB — LIPASE, BLOOD: Lipase: 28 U/L (ref 11–51)

## 2021-11-01 LAB — TROPONIN I (HIGH SENSITIVITY)
Troponin I (High Sensitivity): 36 ng/L — ABNORMAL HIGH (ref <18)
Troponin I (High Sensitivity): 79 ng/L — ABNORMAL HIGH (ref <18)
Troponin I (High Sensitivity): 318 ng/L (ref <18)

## 2021-11-01 MED ORDER — ASPIRIN 325 MG PO TABS: 325.0000 mg | ORAL_TABLET | Freq: Every day | ORAL | Status: DC

## 2021-11-01 MED ORDER — LORAZEPAM 1 MG PO TABS
1.0000 mg | ORAL_TABLET | Freq: Once | ORAL | Status: AC
  Administered 2021-11-01: 1 mg via ORAL
  Filled 2021-11-01: qty 1

## 2021-11-01 MED ORDER — FUROSEMIDE 10 MG/ML IJ SOLN
20.0000 mg | Freq: Once | INTRAMUSCULAR | Status: AC
  Administered 2021-11-01: 20 mg via INTRAVENOUS
  Filled 2021-11-01: qty 2

## 2021-11-01 NOTE — ED Notes (Signed)
Unable to pull ativan from pyxis. Contacted pharmacy to send medication

## 2021-11-01 NOTE — ED Triage Notes (Signed)
Pt BIB EMS due to chest pain in center of chest, non radiating. Pt had MI a couple of moths ago. Pt is axox4. VSS .

## 2021-11-01 NOTE — ED Notes (Signed)
Pt provided with [place] sandwich, crackers, and apple sauce

## 2021-11-01 NOTE — ED Provider Notes (Signed)
[facility] EMERGENCY DEPARTMENT Provider Note   CSN: [id number] Arrival date & time: 11/01/21  1447     History  Chief Complaint  Patient presents with   Chest Pain    Allen Mayer is a 61 y.o. male.  Patient presents to ER chief complaint of chest pain.  Describes aching pain in the mid chest.  Occurred today around noon, he states he was laying down watching TV when the pain started.  Describes it as nonradiating.  Sometimes made worse with movement of his right shoulder.  No associated fever.  Positive cough for 3 to 4 days.  No vomiting or diarrhea reported.  Patient does not smoke cigarettes.         Home Medications Prior to Admission medications   Medication Sig Start Date End Date Taking? Authorizing Provider  acetaminophen (TYLENOL) 500 MG tablet Take 1 tablet (500 mg total) by mouth every 6 (six) hours as needed. 08/13/21   Fayrene Helper, PA-C  albuterol (VENTOLIN HFA) 108 (90 Base) MCG/ACT inhaler Inhale 1-2 puffs into the lungs every 6 (six) hours as needed for wheezing or shortness of breath. 07/26/21   Al Decant, PA-C  aspirin 81 MG EC tablet Take 1 tablet (81 mg total) by mouth daily. Swallow whole. 08/03/21   Barrett, Joline Salt, PA-C  atorvastatin (LIPITOR) 80 MG tablet Take 1 tablet (80 mg total) by mouth daily. 08/03/21   Barrett, Joline Salt, PA-C  carvedilol (COREG) 6.25 MG tablet Take 1 tablet (6.25 mg total) by mouth 2 (two) times daily. 10/06/21   Rollene Rotunda, MD  citalopram (CELEXA) 20 MG tablet Take 1 tablet (20 mg total) by mouth daily. 05/22/20   Jodelle Gross, NP  cyclobenzaprine (FLEXERIL) 10 MG tablet Take 1 tablet (10 mg total) by mouth 2 (two) times daily as needed for muscle spasms. 08/13/21   Fayrene Helper, PA-C  FLOVENT HFA 110 MCG/ACT inhaler Inhale 2 puffs into the lungs 2 (two) times daily. 12/04/19   Rollene Rotunda, MD  gabapentin (NEURONTIN) 400 MG capsule Take 1 capsule (400 mg total) by mouth daily as needed (nerve  pain). 05/22/20   Jodelle Gross, NP  hydrALAZINE (APRESOLINE) 25 MG tablet Take 1 tablet (25 mg total) by mouth every 8 (eight) hours. 08/03/21   Barrett, Joline Salt, PA-C  nitroGLYCERIN (NITROSTAT) 0.4 MG SL tablet Place 1 tablet (0.4 mg total) under the tongue every 5 (five) minutes as needed for chest pain. 10/15/21   Rollene Rotunda, MD  oxyCODONE-acetaminophen (PERCOCET) 5-325 MG tablet Take 1 tablet by mouth every 8 (eight) hours as needed. 10/07/21   Mesner, Barbara Cower, MD  rivaroxaban (XARELTO) 20 MG TABS tablet Take 1 tablet (20 mg total) by mouth daily with supper. 08/03/21   Barrett, Joline Salt, PA-C  torsemide (DEMADEX) 20 MG tablet Take 1 tablet (20 mg total) by mouth daily. Take an extra tablet daily as needed for edema or weight gain 08/03/21   Barrett, Joline Salt, PA-C      Allergies    Patient has no known allergies.    Review of Systems   Review of Systems  Constitutional:  Negative for fever.  HENT:  Negative for ear pain and sore throat.   Eyes:  Negative for pain.  Respiratory:  Positive for cough.   Cardiovascular:  Positive for chest pain.  Gastrointestinal:  Negative for abdominal pain.  Genitourinary:  Negative for flank pain.  Musculoskeletal:  Negative for back pain.  Skin:  Negative for  color change and rash.  Neurological:  Negative for syncope.  All other systems reviewed and are negative.   Physical Exam Updated Vital Signs BP 100/80   Pulse 66   Temp (!) 97.5 F (36.4 C) (Oral)   Resp 16   SpO2 100%  Physical Exam Constitutional:      Appearance: He is well-developed.  HENT:     Head: Normocephalic.     Nose: Nose normal.  Eyes:     Extraocular Movements: Extraocular movements intact.  Cardiovascular:     Rate and Rhythm: Normal rate.  Pulmonary:     Effort: Pulmonary effort is normal.  Chest:     Chest wall: No tenderness.  Abdominal:     General: There is no distension.     Tenderness: There is no abdominal tenderness. There is no guarding.   Skin:    Coloration: Skin is not jaundiced.  Neurological:     Mental Status: He is alert. Mental status is at baseline.     ED Results / Procedures / Treatments   Labs (all labs ordered are listed, but only abnormal results are displayed) Labs Reviewed  CBC WITH DIFFERENTIAL/PLATELET - Abnormal; Notable for the following components:      Result Value   Hemoglobin 12.7 (*)    HCT 38.9 (*)    All other components within normal limits  COMPREHENSIVE METABOLIC PANEL - Abnormal; Notable for the following components:   Glucose, Bld 102 (*)    Calcium 8.5 (*)    Albumin 3.1 (*)    All other components within normal limits  TROPONIN I (HIGH SENSITIVITY) - Abnormal; Notable for the following components:   Troponin I (High Sensitivity) 36 (*)    All other components within normal limits  TROPONIN I (HIGH SENSITIVITY) - Abnormal; Notable for the following components:   Troponin I (High Sensitivity) 79 (*)    All other components within normal limits  TROPONIN I (HIGH SENSITIVITY) - Abnormal; Notable for the following components:   Troponin I (High Sensitivity) 318 (*)    All other components within normal limits  LIPASE, BLOOD  TROPONIN I (HIGH SENSITIVITY)    EKG EKG Interpretation  Date/Time:  Monday November 01 2021 14:57:50 EDT Ventricular Rate:  89 PR Interval:  216 QRS Duration: 90 QT Interval:  402 QTC Calculation: 489 R Axis:   -74 Text Interpretation: Sinus rhythm with 1st degree A-V block Left axis deviation Minimal voltage criteria for LVH, may be normal variant ( Cornell product ) Inferior infarct , age undetermined Anterior infarct , age undetermined Abnormal ECG When compared with ECG of 13-Oct-2021 03:59, PREVIOUS ECG IS PRESENT Confirmed by Norman Clay ([id number]) on 11/01/2021 3:26:56 PM  Radiology DG Chest 1 View  Result Date: 11/01/2021 CLINICAL DATA:  Chest pain EXAM: CHEST  1 VIEW COMPARISON:  10/13/2021 FINDINGS: Unchanged enlarged cardiac silhouette. Normal  mediastinal contours. Improved right basilar atelectasis. No new focal pulmonary opacity. No pleural effusion or pneumothorax. No acute osseous abnormality. IMPRESSION: Cardiomegaly with improved right basilar atelectasis. Electronically Signed   By: Wiliam Ke M.D.   On: 11/01/2021 16:04    Procedures .Critical Care  Performed by: Cheryll Cockayne, MD Authorized by: Cheryll Cockayne, MD   Critical care provider statement:    Critical care time (minutes):  35   Critical care time was exclusive of:  Separately billable procedures and treating other patients and teaching time   Critical care was necessary to treat or prevent imminent or life-threatening  deterioration of the following conditions:  Cardiac failure Comments:     Elevated troponin     Medications Ordered in ED Medications  aspirin tablet 325 mg (has no administration in time range)  LORazepam (ATIVAN) tablet 1 mg (has no administration in time range)  furosemide (LASIX) injection 20 mg (has no administration in time range)    ED Course/ Medical Decision Making/ A&P                           Medical Decision Making Amount and/or Complexity of Data Reviewed Labs: ordered. Radiology: ordered.  Risk OTC drugs. Prescription drug management.   Review of record shows a visit for chest pain July for 2023.  Cardiac monitoring showing sinus rhythm.  Work-up included labs White count of 7 hemoglobin 12 chemistry unremarkable troponins are flat. Chest x-ray is unremarkable no acute cardiopulmonary disease noted per radiologist. EKG shows sinus rhythm no ST elevations or depressions or T wave inversions noted.  Initial troponin was 38, second troponin 78, third troponin was over 300.  Cardiology consulted, recommending medical admission and further evaluation given his extensive heart history and uptrending troponins.        Final Clinical Impression(s) / ED Diagnoses Final diagnoses:  Chest pain  Elevated  troponin  Rx / DC Orders ED Discharge Orders     None         Cheryll Cockayne, MD 11/01/21 [phone]

## 2021-11-02 ENCOUNTER — Observation Stay (HOSPITAL_COMMUNITY): Payer: Medicaid Other

## 2021-11-02 ENCOUNTER — Inpatient Hospital Stay (HOSPITAL_COMMUNITY): Payer: Medicaid Other

## 2021-11-02 DIAGNOSIS — T405X1A Poisoning by cocaine, accidental (unintentional), initial encounter: Secondary | ICD-10-CM | POA: Diagnosis present

## 2021-11-02 DIAGNOSIS — Z720 Tobacco use: Secondary | ICD-10-CM | POA: Diagnosis not present

## 2021-11-02 DIAGNOSIS — F14129 Cocaine abuse with intoxication, unspecified: Secondary | ICD-10-CM | POA: Diagnosis present

## 2021-11-02 DIAGNOSIS — R000 Tachycardia, unspecified: Secondary | ICD-10-CM | POA: Diagnosis present

## 2021-11-02 DIAGNOSIS — R778 Other specified abnormalities of plasma proteins: Secondary | ICD-10-CM | POA: Diagnosis present

## 2021-11-02 DIAGNOSIS — K219 Gastro-esophageal reflux disease without esophagitis: Secondary | ICD-10-CM | POA: Diagnosis present

## 2021-11-02 DIAGNOSIS — J449 Chronic obstructive pulmonary disease, unspecified: Secondary | ICD-10-CM | POA: Diagnosis present

## 2021-11-02 DIAGNOSIS — M1990 Unspecified osteoarthritis, unspecified site: Secondary | ICD-10-CM | POA: Diagnosis present

## 2021-11-02 DIAGNOSIS — J431 Panlobular emphysema: Secondary | ICD-10-CM | POA: Diagnosis not present

## 2021-11-02 DIAGNOSIS — F1010 Alcohol abuse, uncomplicated: Secondary | ICD-10-CM | POA: Diagnosis present

## 2021-11-02 DIAGNOSIS — F14188 Cocaine abuse with other cocaine-induced disorder: Secondary | ICD-10-CM | POA: Diagnosis present

## 2021-11-02 DIAGNOSIS — I5023 Acute on chronic systolic (congestive) heart failure: Secondary | ICD-10-CM | POA: Diagnosis not present

## 2021-11-02 DIAGNOSIS — Z5901 Sheltered homelessness: Secondary | ICD-10-CM | POA: Diagnosis not present

## 2021-11-02 DIAGNOSIS — I252 Old myocardial infarction: Secondary | ICD-10-CM | POA: Diagnosis not present

## 2021-11-02 DIAGNOSIS — F1410 Cocaine abuse, uncomplicated: Secondary | ICD-10-CM | POA: Diagnosis not present

## 2021-11-02 DIAGNOSIS — Z91199 Patient's noncompliance with other medical treatment and regimen due to unspecified reason: Secondary | ICD-10-CM | POA: Diagnosis not present

## 2021-11-02 DIAGNOSIS — I2729 Other secondary pulmonary hypertension: Secondary | ICD-10-CM | POA: Diagnosis present

## 2021-11-02 DIAGNOSIS — I428 Other cardiomyopathies: Secondary | ICD-10-CM | POA: Diagnosis present

## 2021-11-02 DIAGNOSIS — I5021 Acute systolic (congestive) heart failure: Secondary | ICD-10-CM | POA: Diagnosis not present

## 2021-11-02 DIAGNOSIS — N183 Chronic kidney disease, stage 3 unspecified: Secondary | ICD-10-CM | POA: Diagnosis present

## 2021-11-02 DIAGNOSIS — Z91148 Patient's other noncompliance with medication regimen for other reason: Secondary | ICD-10-CM | POA: Diagnosis not present

## 2021-11-02 DIAGNOSIS — E785 Hyperlipidemia, unspecified: Secondary | ICD-10-CM | POA: Diagnosis present

## 2021-11-02 DIAGNOSIS — F259 Schizoaffective disorder, unspecified: Secondary | ICD-10-CM | POA: Diagnosis present

## 2021-11-02 DIAGNOSIS — I513 Intracardiac thrombosis, not elsewhere classified: Secondary | ICD-10-CM

## 2021-11-02 DIAGNOSIS — I2510 Atherosclerotic heart disease of native coronary artery without angina pectoris: Secondary | ICD-10-CM | POA: Diagnosis present

## 2021-11-02 DIAGNOSIS — I5043 Acute on chronic combined systolic (congestive) and diastolic (congestive) heart failure: Secondary | ICD-10-CM | POA: Diagnosis present

## 2021-11-02 DIAGNOSIS — N179 Acute kidney failure, unspecified: Secondary | ICD-10-CM | POA: Diagnosis present

## 2021-11-02 DIAGNOSIS — F17210 Nicotine dependence, cigarettes, uncomplicated: Secondary | ICD-10-CM | POA: Diagnosis present

## 2021-11-02 DIAGNOSIS — I130 Hypertensive heart and chronic kidney disease with heart failure and stage 1 through stage 4 chronic kidney disease, or unspecified chronic kidney disease: Secondary | ICD-10-CM | POA: Diagnosis present

## 2021-11-02 DIAGNOSIS — I214 Non-ST elevation (NSTEMI) myocardial infarction: Secondary | ICD-10-CM | POA: Diagnosis present

## 2021-11-02 DIAGNOSIS — R079 Chest pain, unspecified: Secondary | ICD-10-CM

## 2021-11-02 DIAGNOSIS — Y929 Unspecified place or not applicable: Secondary | ICD-10-CM | POA: Diagnosis not present

## 2021-11-02 DIAGNOSIS — Z20822 Contact with and (suspected) exposure to covid-19: Secondary | ICD-10-CM | POA: Diagnosis present

## 2021-11-02 LAB — APTT
aPTT: 44 seconds — ABNORMAL HIGH (ref 24–36)
aPTT: 66 seconds — ABNORMAL HIGH (ref 24–36)

## 2021-11-02 LAB — ECHOCARDIOGRAM COMPLETE
Height: 72 in
S' Lateral: 6.5 cm
Weight: 2880 oz

## 2021-11-02 LAB — BRAIN NATRIURETIC PEPTIDE: B Natriuretic Peptide: 1448.9 pg/mL — ABNORMAL HIGH (ref 0.0–100.0)

## 2021-11-02 LAB — HEPATIC FUNCTION PANEL
ALT: 31 U/L (ref 0–44)
AST: 34 U/L (ref 15–41)
Albumin: 2.7 g/dL — ABNORMAL LOW (ref 3.5–5.0)
Alkaline Phosphatase: 73 U/L (ref 38–126)
Bilirubin, Direct: 0.3 mg/dL — ABNORMAL HIGH (ref 0.0–0.2)
Indirect Bilirubin: 1.2 mg/dL — ABNORMAL HIGH (ref 0.3–0.9)
Total Bilirubin: 1.5 mg/dL — ABNORMAL HIGH (ref 0.3–1.2)
Total Protein: 6.7 g/dL (ref 6.5–8.1)

## 2021-11-02 LAB — RAPID URINE DRUG SCREEN, HOSP PERFORMED
Amphetamines: NOT DETECTED
Barbiturates: NOT DETECTED
Benzodiazepines: NOT DETECTED
Cocaine: POSITIVE — AB
Opiates: NOT DETECTED
Tetrahydrocannabinol: NOT DETECTED

## 2021-11-02 LAB — CBC
HCT: 41.6 % (ref 39.0–52.0)
Hemoglobin: 13.7 g/dL (ref 13.0–17.0)
MCH: 28.8 pg (ref 26.0–34.0)
MCHC: 32.9 g/dL (ref 30.0–36.0)
MCV: 87.6 fL (ref 80.0–100.0)
Platelets: 188 10*3/uL (ref 150–400)
RBC: 4.75 MIL/uL (ref 4.22–5.81)
RDW: 14.3 % (ref 11.5–15.5)
WBC: 6.4 10*3/uL (ref 4.0–10.5)
nRBC: 0 % (ref 0.0–0.2)

## 2021-11-02 LAB — COMPREHENSIVE METABOLIC PANEL
ALT: 32 U/L (ref 0–44)
AST: 41 U/L (ref 15–41)
Albumin: 3 g/dL — ABNORMAL LOW (ref 3.5–5.0)
Alkaline Phosphatase: 80 U/L (ref 38–126)
Anion gap: 8 (ref 5–15)
BUN: 15 mg/dL (ref 8–23)
CO2: 29 mmol/L (ref 22–32)
Calcium: 9 mg/dL (ref 8.9–10.3)
Chloride: 101 mmol/L (ref 98–111)
Creatinine, Ser: 1.09 mg/dL (ref 0.61–1.24)
GFR, Estimated: >60 mL/min (ref >60)
Glucose, Bld: 105 mg/dL — ABNORMAL HIGH (ref 70–99)
Potassium: 3.7 mmol/L (ref 3.5–5.1)
Sodium: 138 mmol/L (ref 135–145)
Total Bilirubin: 1.7 mg/dL — ABNORMAL HIGH (ref 0.3–1.2)
Total Protein: 7.4 g/dL (ref 6.5–8.1)

## 2021-11-02 LAB — PHOSPHORUS: Phosphorus: 3 mg/dL (ref 2.5–4.6)

## 2021-11-02 LAB — TROPONIN I (HIGH SENSITIVITY)
Troponin I (High Sensitivity): 1229 ng/L (ref <18)
Troponin I (High Sensitivity): 2311 ng/L (ref <18)
Troponin I (High Sensitivity): 823 ng/L (ref <18)

## 2021-11-02 LAB — URINALYSIS, COMPLETE (UACMP) WITH MICROSCOPIC
Bacteria, UA: NONE SEEN
Bilirubin Urine: NEGATIVE
Glucose, UA: NEGATIVE mg/dL
Hgb urine dipstick: NEGATIVE
Ketones, ur: NEGATIVE mg/dL
Leukocytes,Ua: NEGATIVE
Nitrite: NEGATIVE
Protein, ur: 30 mg/dL — AB
Specific Gravity, Urine: 1.008 (ref 1.005–1.030)
pH: 6 (ref 5.0–8.0)

## 2021-11-02 LAB — SARS CORONAVIRUS 2 BY RT PCR: SARS Coronavirus 2 by RT PCR: NEGATIVE

## 2021-11-02 LAB — TSH: TSH: 1.371 u[IU]/mL (ref 0.350–4.500)

## 2021-11-02 LAB — ECHOCARDIOGRAM LIMITED
Height: 72 in
Weight: 2880 oz

## 2021-11-02 LAB — MAGNESIUM: Magnesium: 1.6 mg/dL — ABNORMAL LOW (ref 1.7–2.4)

## 2021-11-02 LAB — CK: Total CK: 214 U/L (ref 49–397)

## 2021-11-02 LAB — HEPARIN LEVEL (UNFRACTIONATED): Heparin Unfractionated: 0.22 IU/mL — ABNORMAL LOW (ref 0.30–0.70)

## 2021-11-02 LAB — PREALBUMIN: Prealbumin: 14 mg/dL — ABNORMAL LOW (ref 18–38)

## 2021-11-02 MED ORDER — ALBUTEROL SULFATE (2.5 MG/3ML) 0.083% IN NEBU: 2.5000 mg | INHALATION_SOLUTION | Freq: Four times a day (QID) | RESPIRATORY_TRACT | Status: DC | PRN

## 2021-11-02 MED ORDER — SODIUM CHLORIDE 0.9% FLUSH
3.0000 mL | Freq: Two times a day (BID) | INTRAVENOUS | Status: DC
Start: 2021-11-02 — End: 2021-11-04
  Administered 2021-11-02 – 2021-11-04 (×4): 3 mL via INTRAVENOUS

## 2021-11-02 MED ORDER — HEPARIN (PORCINE) 25000 UT/250ML-% IV SOLN
1700.0000 [IU]/h | INTRAVENOUS | Status: DC
  Administered 2021-11-02: 1500 [IU]/h via INTRAVENOUS
  Administered 2021-11-02 – 2021-11-04 (×3): 1700 [IU]/h via INTRAVENOUS
  Filled 2021-11-02 (×4): qty 250

## 2021-11-02 MED ORDER — GABAPENTIN 400 MG PO CAPS: 400.0000 mg | ORAL_CAPSULE | Freq: Every day | ORAL | Status: DC | PRN

## 2021-11-02 MED ORDER — MAGNESIUM SULFATE 2 GM/50ML IV SOLN
2.0000 g | Freq: Once | INTRAVENOUS | Status: AC
  Administered 2021-11-02: 2 g via INTRAVENOUS
  Filled 2021-11-02: qty 50

## 2021-11-02 MED ORDER — ACETAMINOPHEN 325 MG PO TABS: 650.0000 mg | ORAL_TABLET | Freq: Four times a day (QID) | ORAL | Status: DC | PRN

## 2021-11-02 MED ORDER — LOSARTAN POTASSIUM 25 MG PO TABS
25.0000 mg | ORAL_TABLET | Freq: Every day | ORAL | Status: DC
  Administered 2021-11-02 – 2021-11-04 (×3): 25 mg via ORAL
  Filled 2021-11-02 (×3): qty 1

## 2021-11-02 MED ORDER — ALBUTEROL SULFATE HFA 108 (90 BASE) MCG/ACT IN AERS: 1.0000 | INHALATION_SPRAY | Freq: Four times a day (QID) | RESPIRATORY_TRACT | Status: DC | PRN

## 2021-11-02 MED ORDER — SODIUM CHLORIDE 0.9% FLUSH: 3.0000 mL | INTRAVENOUS | Status: DC | PRN

## 2021-11-02 MED ORDER — ATORVASTATIN CALCIUM 80 MG PO TABS
80.0000 mg | ORAL_TABLET | Freq: Every day | ORAL | Status: DC
  Administered 2021-11-02 – 2021-11-04 (×3): 80 mg via ORAL
  Filled 2021-11-02 (×2): qty 1
  Filled 2021-11-02: qty 2

## 2021-11-02 MED ORDER — PERFLUTREN LIPID MICROSPHERE
1.0000 mL | INTRAVENOUS | Status: AC | PRN
  Administered 2021-11-02: 3 mL via INTRAVENOUS

## 2021-11-02 MED ORDER — ENOXAPARIN SODIUM 80 MG/0.8ML IJ SOSY
80.0000 mg | PREFILLED_SYRINGE | Freq: Two times a day (BID) | INTRAMUSCULAR | Status: DC
  Filled 2021-11-02: qty 0.8

## 2021-11-02 MED ORDER — ASPIRIN 81 MG PO CHEW
81.0000 mg | CHEWABLE_TABLET | Freq: Every day | ORAL | Status: DC
  Administered 2021-11-02 – 2021-11-04 (×3): 81 mg via ORAL
  Filled 2021-11-02 (×3): qty 1

## 2021-11-02 MED ORDER — FUROSEMIDE 10 MG/ML IJ SOLN
20.0000 mg | Freq: Every day | INTRAMUSCULAR | Status: DC
  Administered 2021-11-02 – 2021-11-03 (×2): 20 mg via INTRAVENOUS
  Filled 2021-11-02 (×2): qty 2

## 2021-11-02 MED ORDER — CITALOPRAM HYDROBROMIDE 20 MG PO TABS
20.0000 mg | ORAL_TABLET | Freq: Every day | ORAL | Status: DC
  Administered 2021-11-02 – 2021-11-04 (×3): 20 mg via ORAL
  Filled 2021-11-02: qty 1
  Filled 2021-11-02: qty 2
  Filled 2021-11-02: qty 1

## 2021-11-02 MED ORDER — HYDROCODONE-ACETAMINOPHEN 5-325 MG PO TABS
1.0000 | ORAL_TABLET | ORAL | Status: DC | PRN
  Administered 2021-11-02 – 2021-11-04 (×8): 2 via ORAL
  Filled 2021-11-02 (×8): qty 2

## 2021-11-02 MED ORDER — RIVAROXABAN 10 MG PO TABS
20.0000 mg | ORAL_TABLET | Freq: Every day | ORAL | Status: DC
Start: 2021-11-02 — End: 2021-11-02

## 2021-11-02 MED ORDER — NICOTINE 21 MG/24HR TD PT24
21.0000 mg | MEDICATED_PATCH | Freq: Every day | TRANSDERMAL | Status: DC
  Administered 2021-11-02 – 2021-11-04 (×3): 21 mg via TRANSDERMAL
  Filled 2021-11-02 (×3): qty 1

## 2021-11-02 MED ORDER — ACETAMINOPHEN 650 MG RE SUPP: 650.0000 mg | Freq: Four times a day (QID) | RECTAL | Status: DC | PRN

## 2021-11-02 MED ORDER — SODIUM CHLORIDE 0.9 % IV SOLN: 250.0000 mL | INTRAVENOUS | Status: DC | PRN

## 2021-11-02 NOTE — Significant Event (Signed)
Patient's troponin went up to 1229 and the last one was 2311.  Patient is chest pain-free.  Discussed with cardiologist Dr. Okey Dupre.  Cardiologist advised to stop Lovenox and start patient on heparin for ACS protocol.  Discussed with pharmacy.  Midge Minium

## 2021-11-02 NOTE — Assessment & Plan Note (Addendum)
Cardiology aware No plan to cath had recent cardiac cath Elevated trop in the setting of severe systolic chf and cocaine  abuse  rec diurese and follow trop Change from xarelto to lovenox as per Cardiology

## 2021-11-02 NOTE — ED Notes (Signed)
MD Octavio Graves updated on patient's critical troponin level. Patient is currently denying CP or SOB. No symptomatic changes at this time. MD aware.no new orders.

## 2021-11-02 NOTE — Progress Notes (Addendum)
Progress Note  Patient Name: Allen Mayer Date of Encounter: 11/02/2021  Primary Cardiologist: Rollene Rotunda, MD  Subjective   States he did not want the echo tech to finish the study, told her to go away when she asked for additional pictures. Not clear to me that the study was fully completed but is pending final read. Denies CP. Asking to eat. Tired of being here.  Inpatient Medications    Scheduled Meds:  aspirin  81 mg Oral Daily   atorvastatin  80 mg Oral Daily   citalopram  20 mg Oral Daily   furosemide  20 mg Intravenous Daily   nicotine  21 mg Transdermal Daily   sodium chloride flush  3 mL Intravenous Q12H   Continuous Infusions:  sodium chloride     heparin 1,500 Units/hr (11/02/21 0510)   PRN Meds: sodium chloride, acetaminophen **OR** acetaminophen, albuterol, gabapentin, HYDROcodone-acetaminophen, sodium chloride flush   Vital Signs    Vitals:   11/02/21 0602 11/02/21 0636 11/02/21 0636 11/02/21 0725  BP: 115/73  (!) 137/91 (!) 137/105  Pulse: (!) 102  92 (!) 107  Resp:  18  15  Temp:   98.3 F (36.8 C)   TempSrc:   Oral   SpO2:   100% 98%  Weight:      Height:        Intake/Output Summary (Last 24 hours) at 11/02/2021 1001 Last data filed at 11/01/2021 2300 Gross per 24 hour  Intake --  Output 300 ml  Net -300 ml      11/02/2021    1:00 AM 10/13/2021    3:56 AM 08/13/2021    1:36 PM  Last 3 Weights  Weight (lbs) 180 lb 180 lb 180 lb  Weight (kg) 81.647 kg 81.647 kg 81.647 kg     Telemetry    N/A - patient is in hallway bed - Personally Reviewed  ECG    NSR 89bpm, first degree AVB, prior inferior and anterior infarct, LAFB, LVH, nonspecific STT changes - Personally Reviewed  Physical Exam   GEN: No acute distress.  HEENT: Normocephalic, atraumatic, sclera appears chronically icteric Neck: No JVD or bruits. Cardiac: RRR no murmurs, rubs, or gallops.  Respiratory: Clear to auscultation bilaterally. Breathing is unlabored. GI: Soft,  nontender, non-distended, BS +x 4. MS: no deformity. Extremities: No clubbing or cyanosis. No edema. Distal pedal pulses are 2+ and equal bilaterally. Neuro:  AAOx3. Follows commands. Psych:  Responds to questions appropriately with a normal affect.  Labs    High Sensitivity Troponin:   Recent Labs  Lab 11/01/21 1805 11/01/21 2050 11/01/21 2330 11/02/21 0104 11/02/21 0315  TROPONINIHS 79* 318* 823* 1,229* 2,311*     Chemistry Recent Labs  Lab 11/01/21 1527 11/02/21 0104 11/02/21 0315  NA 135  --  138  K 4.0  --  3.7  CL 102  --  101  CO2 24  --  29  GLUCOSE 102*  --  105*  BUN 18  --  15  CREATININE 0.92  --  1.09  CALCIUM 8.5*  --  9.0  PROT 6.9 6.7 7.4  ALBUMIN 3.1* 2.7* 3.0*  AST 26 34 41  ALT 35 31 32  ALKPHOS 82 73 80  BILITOT 1.2 1.5* 1.7*  GFRNONAA >60  --  >60  ANIONGAP 9  --  8     Hematology Recent Labs  Lab 11/01/21 1527 11/02/21 0315  WBC 7.5 6.4  RBC 4.45 4.75  HGB 12.7* 13.7  HCT 38.9*  41.6  MCV 87.4 87.6  MCH 28.5 28.8  MCHC 32.6 32.9  RDW 14.2 14.3  PLT 187 188    BNP Recent Labs  Lab 11/02/21 0315  BNP 1,448.9*     DDimer No results for input(s): "DDIMER" in the last 168 hours.   Radiology    DG Chest 1 View  Result Date: 11/01/2021 CLINICAL DATA:  Chest pain EXAM: CHEST  1 VIEW COMPARISON:  10/13/2021 FINDINGS: Unchanged enlarged cardiac silhouette. Normal mediastinal contours. Improved right basilar atelectasis. No new focal pulmonary opacity. No pleural effusion or pneumothorax. No acute osseous abnormality. IMPRESSION: Cardiomegaly with improved right basilar atelectasis. Electronically Signed   By: Wiliam Ke M.D.   On: 11/01/2021 16:04    Cardiac Studies   Cath 07/2021 Mild, non-obstructive coronary artery disease. Normal left and right heart filling pressures. Moderately-severely reduced Fick cardiac output/index.   Recommendations: Maintain net even fluid balance. Escalate goal-directed medical therapy for  NICM, as tolerated. Restart IV heparin 2 hours after TR band removal.  Transition to oral anticoagulation in the setting of LV thrombus per primary team.   Yvonne Kendall, MD [facility]  Echo 07/2021    1. Large mural thrombus at the apex and separate thrombus associated with  the posterior mitral leaflet/mitral commisure. Left ventricular ejection  fraction, by estimation, is 20 to 25%. The left ventricle has severely  decreased function. The left  ventricle demonstrates global hypokinesis. The left ventricular internal  cavity size was severely dilated. Left ventricular diastolic parameters  are consistent with Grade III diastolic dysfunction (restrictive).  Elevated left ventricular end-diastolic  pressure. The E/e' is 30.   2. Right ventricular systolic function is low normal. The right  ventricular size is mildly enlarged.   3. The mitral valve is abnormal. Trivial mitral valve regurgitation.   4. The aortic valve is tricuspid. Aortic valve regurgitation is not  visualized.   5. The inferior vena cava is normal in size with greater than 50%  respiratory variability, suggesting right atrial pressure of 3 mmHg.   Comparison(s): Changes from prior study are noted. 10/26/2020: LVEF 30-35%,  moderately dilated LV.   Conclusion(s)/Recommendation(s): Critical findings reported to Dr. Ladona Ridgel  and acknowledged at 3:30 pm.   Patient Profile     61 y.o. 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   Assessment & Plan    1. Elevated troponin felt demand ischemia in setting of cocaine abuse, h/o mild CAD 07/2021 - peak 2311 so far - currently on ASA, statin, heparin - not on BB due to ongoing cocaine use - will discuss dispo with MD - recent cath 07/2021 without obstructive CAD  (mild)  2. Acute on chronic HFrEF/NICM - volume status looks OK though BNP up, so IV Lasix 20mg  daily ordered by primary team - 2D echo pending, patient was unhappy with completing the study - will review recs with MD  3. Cocaine abuse - contributing to presentation, recommend SW resources for cessation and social concerns with homelessness  4. H/o LV thrombus - diagnosed in April, at which time he was started on Xarelto due to concerns about compliance with warfarin - repeat echo pending - transitioned to heparin due to #1 during this admission  Remainder of medical issues per IM. Will defer lyte mgmt to primary team.  For  questions or updates, please contact [place] Please consult [email or web address] for contact info under Cardiology/STEMI.  Signed, Charlie Pitter, PA-C 11/02/2021, 10:01 AM

## 2021-11-02 NOTE — Progress Notes (Signed)
  Echocardiogram 2D Echocardiogram has been performed.  Delcie Roch 11/02/2021, 10:04 AM

## 2021-11-02 NOTE — Progress Notes (Signed)
PT Cancellation Note  Patient Details Name: Allen Mayer MRN: [id number] DOB: 1960/04/19   Cancelled Treatment:    Reason Eval/Treat Not Completed: Patient at procedure or test/unavailable.  Gone to echocardiogram, reattempt as time and pt allow.   Ivar Drape 11/02/2021, 9:37 AM  Samul Dada, PT PhD Acute Rehab Dept. Number: [facility] [id number] and [facility] [phone]

## 2021-11-02 NOTE — Progress Notes (Signed)
ANTICOAGULATION CONSULT NOTE  Pharmacy Consult for heparin Indication: chest pain/ACS and recent mural thrombus  No Known Allergies  Patient Measurements: Height: 6' (182.9 cm) Weight: 78.8 kg (173 lb 11.6 oz) IBW/kg (Calculated) : 77.6  Vital Signs: Temp: 98.1 F (36.7 C) (07/25 1545) Temp Source: Oral (07/25 1545) BP: 110/87 (07/25 1545) Pulse Rate: 78 (07/25 1545)  Labs: Recent Labs    11/01/21 1527 11/01/21 1805 11/01/21 2330 11/02/21 0104 11/02/21 0315 11/02/21 1315 11/02/21 2043  HGB 12.7*  --   --   --  13.7  --   --   HCT 38.9*  --   --   --  41.6  --   --   PLT 187  --   --   --  188  --   --   APTT  --   --   --   --   --  44* 66*  HEPARINUNFRC  --   --   --   --   --  0.22*  --   CREATININE 0.92  --   --   --  1.09  --   --   CKTOTAL  --   --   --  214  --   --   --   TROPONINIHS 36*   < > 823* 1,229* 2,311*  --   --    < > = values in this interval not displayed.     Estimated Creatinine Clearance: 77.1 mL/min (by C-G formula based on SCr of 1.09 mg/dL).   Assessment: 61yo male on Xarelto for recent mural thrombus (Apr 2023) presents w/ CP, now CP free but troponin rising (36>79>318>823>1229>2311). Pharmacy consulted to start heparin.  Pt states his last Xarelto dose was "yesterday morning" but couldn't clarify 7/24 vs 7/23.  aPTT 66 sec (low end of therapeutic range). No bleeding noted.  Goal of Therapy:  Heparin level 0.3-0.7 units/ml aPTT 66-102 seconds Monitor platelets by anticoagulation protocol: Yes   Plan:  Increase heparin to 1750 units/hr to keep in therapeutic range Daily aPTT and heparin level - will likely be able to dose via heparin levels tomorrow  Christoper Fabian, PharmD, BCPS Please see amion for complete clinical pharmacist phone list 11/02/2021  10:07 PM

## 2021-11-02 NOTE — Assessment & Plan Note (Signed)
counseled on quiting

## 2021-11-02 NOTE — Progress Notes (Signed)
ANTICOAGULATION CONSULT NOTE - Initial Consult  Pharmacy Consult for Lovenox Indication: chest pain/ACS  No Known Allergies  Patient Measurements: Height: 6' (182.9 cm) Weight: 81.6 kg (180 lb) IBW/kg (Calculated) : 77.6 Heparin Dosing Weight: 81 Kg  Vital Signs: Temp: 97.5 F (36.4 C) (07/24 1453) Temp Source: Oral (07/24 1453) BP: 100/80 (07/24 2131) Pulse Rate: 66 (07/24 2131)  Labs: Recent Labs    11/01/21 1527 11/01/21 1805 11/01/21 2050 11/01/21 2330 11/02/21 0104  HGB 12.7*  --   --   --   --   HCT 38.9*  --   --   --   --   PLT 187  --   --   --   --   CREATININE 0.92  --   --   --   --   CKTOTAL  --   --   --   --  214  TROPONINIHS 36*   < > 318* 823* 1,229*   < > = values in this interval not displayed.    Estimated Creatinine Clearance: 91.4 mL/min (by C-G formula based on SCr of 0.92 mg/dL).   Medical History: Past Medical History:  Diagnosis Date   Acid reflux    Alcohol abuse    Arthritis    Asthma    Back pain    Bronchitis    Chest pain 07/31/2021   CHF (congestive heart failure) (HCC)    CKD (chronic kidney disease)    CKD (chronic kidney disease), stage III (HCC)    Cocaine abuse (HCC)    COPD (chronic obstructive pulmonary disease) (HCC)    History of noncompliance with medical treatment, presenting hazards to health    Homelessness    Hypertension    LV (left ventricular) mural thrombus    Myocardial infarction [facility])    Neuropathic pain    NICM (nonischemic cardiomyopathy) (HCC) 2019   NSTEMI (non-ST elevated myocardial infarction) (HCC) 07/31/2021   NSVT (nonsustained ventricular tachycardia) (HCC)    Pericardial effusion    Pulmonary hypertension (HCC)    RVF (right ventricular failure) (HCC)    Schizo affective schizophrenia [facility])     Assessment: 61 yo male homeless male with a history of HFrEF due to NICM, LV thrombus (on Xarelto), schizophrenia, COPD, and cocaine abuse presented to the ED with chest pain. Last dose of  Xarelto take ~ 2 days ago per patient, but cannot confirm. Patient has been in the ED since Mid day 7/24 and he states he did not take medications prior to arrival. Pharmacy consulted to start Lovenox for ACS. First dose to be given  in the morning since unsure of the exact timing of last dose.   Goal of Therapy:  Monitor platelets by anticoagulation protocol: Yes   Plan:  Lovenox 80mg  BID  , PharmD Clinical Pharmacist 11/02/2021 2:48 AM Please check AMION for all [facility] numbers

## 2021-11-02 NOTE — Assessment & Plan Note (Signed)
-   Spoke about importance of quitting spent 5 minutes discussing options for treatment, prior attempts at quitting, and dangers of smoking ? -At this point patient is    NOT  interested in quitting ? - order nicotine patch  ? - nursing tobacco cessation protocol ? ?

## 2021-11-02 NOTE — Subjective & Objective (Signed)
Comes with leg swelling CP Continues to use cocaine 4 days ago and Etoh Had NSTEMI 3 m ago and had a cath that showed minimal disease Describes burning CP He was watching TV No fever Cough for 3-4 days

## 2021-11-02 NOTE — H&P (Signed)
Allen Mayer YM:[id number] DOB: 1960/12/21 DOA: 11/01/2021     PCP: Merryl Hacker, No   Outpatient Specialists:   CARDS: Minus Breeding, MD    Patient arrived to ER on 11/01/21 at 1447 Referred by Attending Luna Fuse, MD   Patient coming from:    home Lives alone,   Chief Complaint:   Chief Complaint  Patient presents with   Chest Pain    HPI: Allen Mayer is a 61 y.o. male with medical history significant of diastolic CHF, etoH abuse, cocaine abuse    Presented with recurrent chest pain  Comes with leg swelling CP Continues to use cocaine 4 days ago and Etoh Had NSTEMI 3 m ago and had a cath that showed minimal disease Describes burning CP He was watching TV No fever Cough for 3-4 days    States he smokes a pack a day last etoh was 3 daysa go but does not drink regularly  Last cocaine was 3-4 days ago Regarding pertinent Chronic problems:     Hyperlipidemia -  on statins Lipitor (atorvastatin)  Lipid Panel     Component Value Date/Time   CHOL 106 07/31/2021 0810   TRIG 45 07/31/2021 0810   HDL 28 (L) 07/31/2021 0810   CHOLHDL 3.8 07/31/2021 0810   VLDL 9 07/31/2021 0810   LDLCALC 69 07/31/2021 0810     HTN on hydralazine   chronic CHF diastolic/systolic/ combined - last echo April EF 20 to 25%.Grade III diastolic dysfunction  Torsemide     While in ER:   Noted to have rapidly elevated troponin cardiology consulted feels that no current indication for cardiac catheterization Admit to medicine Continue cycle cardiac enzymes     CXR -cardiomegaly    Following Medications were ordered in ER: Medications  aspirin tablet 325 mg (has no administration in time range)  LORazepam (ATIVAN) tablet 1 mg (1 mg Oral Given 11/01/21 2346)  furosemide (LASIX) injection 20 mg (20 mg Intravenous Given 11/01/21 2349)    _______________________________________________________ ER Provider Called: Cardiology     Dr. Kalman Shan  They Recommend admit to medicine     SEEN in ER   ED Triage Vitals  Enc Vitals Group     BP 11/01/21 1453 104/84     Pulse Rate 11/01/21 1453 86     Resp 11/01/21 1453 18     Temp 11/01/21 1453 (!) 97.5 F (36.4 C)     Temp Source 11/01/21 1453 Oral     SpO2 11/01/21 1453 100 %     Weight --      Height --      Head Circumference --      Peak Flow --      Pain Score 11/01/21 1454 10     Pain Loc --      Pain Edu? --      Excl. in [place]? --   TMAX(24)@     _________________________________________ Significant initial  Findings: Abnormal Labs Reviewed  CBC WITH DIFFERENTIAL/PLATELET - Abnormal; Notable for the following components:      Result Value   Hemoglobin 12.7 (*)    HCT 38.9 (*)    All other components within normal limits  COMPREHENSIVE METABOLIC PANEL - Abnormal; Notable for the following components:   Glucose, Bld 102 (*)    Calcium 8.5 (*)    Albumin 3.1 (*)    All other components within normal limits  TROPONIN I (HIGH SENSITIVITY) - Abnormal; Notable for the following components:  Troponin I (High Sensitivity) 36 (*)    All other components within normal limits  TROPONIN I (HIGH SENSITIVITY) - Abnormal; Notable for the following components:   Troponin I (High Sensitivity) 79 (*)    All other components within normal limits  TROPONIN I (HIGH SENSITIVITY) - Abnormal; Notable for the following components:   Troponin I (High Sensitivity) 318 (*)    All other components within normal limits     _________________________ Troponin 79 - 318 ECG: Ordered Personally reviewed and interpreted by me showing: HR : 89 Rhythm: Sinus rhythm with 1st degree A-V block Left axis deviation Minimal voltage criteria for LVH, may be normal variant ( Cornell product ) Inferior infarct , age undetermined Anterior infarct , age undetermined Abnormal ECG QTC 489    The recent clinical data is shown below. Vitals:   11/01/21 1453 11/01/21 2131  BP: 104/84 100/80  Pulse: 86 66  Resp: 18 16  Temp: (!) 97.5  F (36.4 C)   TempSrc: Oral   SpO2: 100% 100%     WBC     Component Value Date/Time   WBC 7.5 11/01/2021 1527   LYMPHSABS 1.5 11/01/2021 1527   LYMPHSABS 1.7 11/14/2019 1007   MONOABS 0.7 11/01/2021 1527   EOSABS 0.3 11/01/2021 1527   EOSABS 0.2 11/14/2019 1007   BASOSABS 0.0 11/01/2021 1527   BASOSABS 0.1 11/14/2019 1007      UA   ordered      _______________________________________________ Hospitalist was called for admission for   Nonspecific chest pain  Elevated troponin    The following Work up has been ordered so far:  Orders Placed This Encounter  Procedures   Critical Care   DG Chest 1 View   CBC with Differential   Comprehensive metabolic panel   Lipase, blood   Inpatient consult to Cardiology   Inpatient consult to Cardiology   Inpatient consult to Cardiology   Consult to hospitalist   EKG 12-Lead     OTHER Significant initial  Findings:  labs showing:    Recent Labs  Lab 11/01/21 1527  NA 135  K 4.0  CO2 24  GLUCOSE 102*  BUN 18  CREATININE 0.92  CALCIUM 8.5*    Cr   stable,   Lab Results  Component Value Date   CREATININE 0.92 11/01/2021   CREATININE 0.74 10/13/2021   CREATININE 1.16 10/07/2021    Recent Labs  Lab 11/01/21 1527  AST 26  ALT 35  ALKPHOS 82  BILITOT 1.2  PROT 6.9  ALBUMIN 3.1*   Lab Results  Component Value Date   CALCIUM 8.5 (L) 11/01/2021   PHOS 4.2 10/29/2019    Plt: Lab Results  Component Value Date   PLT 187 11/01/2021      COVID-19 Labs  No results for input(s): "DDIMER", "FERRITIN", "LDH", "CRP" in the last 72 hours.  Lab Results  Component Value Date   SARSCOV2NAA NEGATIVE 12/14/2020   SARSCOV2NAA NEGATIVE 10/26/2020   SARSCOV2NAA NEGATIVE 02/28/2020   [place] NEGATIVE 10/27/2019       Recent Labs  Lab 11/01/21 1527  WBC 7.5  NEUTROABS 5.1  HGB 12.7*  HCT 38.9*  MCV 87.4  PLT 187    HG/HCT * stable,  Down *Up from baseline see below    Component Value Date/Time    HGB 12.7 (L) 11/01/2021 1527   HGB 14.0 11/14/2019 1007   HCT 38.9 (L) 11/01/2021 1527   HCT 42.1 11/14/2019 1007   MCV 87.4 11/01/2021 1527  MCV 85 11/14/2019 1007      Recent Labs  Lab 11/01/21 1527  LIPASE 28   No results for input(s): "AMMONIA" in the last 168 hours.    Cardiac Panel (last 3 results) No results for input(s): "CKTOTAL", "CKMB", "TROPONINI", "RELINDX" in the last 72 hours.  .car BNP (last 3 results) Recent Labs    08/13/21 1431 10/07/21 0634 10/13/21 0408  BNP 882.0* 787.8* 1,451.1*      DM  labs:  HbA1C: Recent Labs    12/14/20 2341  HGBA1C 6.0*          Cultures:    Component Value Date/Time   SDES URINE, RANDOM 07/14/2017 1018   SPECREQUEST NONE 07/14/2017 1018   CULT  07/14/2017 1018    NO GROWTH Performed at [facility], [address], [place], [place] [address]    REPTSTATUS 07/15/2017 FINAL 07/14/2017 1018     Radiological Exams on Admission: DG Chest 1 View  Result Date: 11/01/2021 CLINICAL DATA:  Chest pain EXAM: CHEST  1 VIEW COMPARISON:  10/13/2021 FINDINGS: Unchanged enlarged cardiac silhouette. Normal mediastinal contours. Improved right basilar atelectasis. No new focal pulmonary opacity. No pleural effusion or pneumothorax. No acute osseous abnormality. IMPRESSION: Cardiomegaly with improved right basilar atelectasis. Electronically Signed   By: Wiliam Ke M.D.   On: 11/01/2021 16:04   _______________________________________________________________________________________________________ Latest  Blood pressure 100/80, pulse 66, temperature (!) 97.5 F (36.4 C), temperature source Oral, resp. rate 16, SpO2 100 %.   Vitals  labs and radiology finding personally reviewed  Review of Systems:    Pertinent positives include:  chest pain,   Constitutional:  No weight loss, night sweats, Fevers, chills, fatigue, weight loss  HEENT:  No headaches, Difficulty swallowing,Tooth/dental problems,Sore throat,  No  sneezing, itching, ear ache, nasal congestion, post nasal drip,  Cardio-vascular:  No Orthopnea, PND, anasarca, dizziness, palpitations.no Bilateral lower extremity swelling  GI:  No heartburn, indigestion, abdominal pain, nausea, vomiting, diarrhea, change in bowel habits, loss of appetite, melena, blood in stool, hematemesis Resp:  no shortness of breath at rest. No dyspnea on exertion, No excess mucus, no productive cough, No non-productive cough, No coughing up of blood.No change in color of mucus.No wheezing. Skin:  no rash or lesions. No jaundice GU:  no dysuria, change in color of urine, no urgency or frequency. No straining to urinate.  No flank pain.  Musculoskeletal:  No joint pain or no joint swelling. No decreased range of motion. No back pain.  Psych:  No change in mood or affect. No depression or anxiety. No memory loss.  Neuro: no localizing neurological complaints, no tingling, no weakness, no double vision, no gait abnormality, no slurred speech, no confusion  All systems reviewed and apart from HOPI all are negative _______________________________________________________________________________________________ Past Medical History:   Past Medical History:  Diagnosis Date   Acid reflux    Alcohol abuse    Arthritis    Asthma    Back pain    Bronchitis    Chest pain 07/31/2021   CHF (congestive heart failure) (HCC)    CKD (chronic kidney disease)    CKD (chronic kidney disease), stage III (HCC)    Cocaine abuse (HCC)    COPD (chronic obstructive pulmonary disease) (HCC)    History of noncompliance with medical treatment, presenting hazards to health    Homelessness    Hypertension    LV (left ventricular) mural thrombus    Myocardial infarction (HCC)    Neuropathic pain  NICM (nonischemic cardiomyopathy) ([place]) 2019   NSTEMI (non-ST elevated myocardial infarction) ([place]) 07/31/2021   NSVT (nonsustained ventricular tachycardia) (HCC)    Pericardial effusion     Pulmonary hypertension ([place])    RVF (right ventricular failure) ([place])    Schizo affective schizophrenia ([place])       Past Surgical History:  Procedure Laterality Date   I & D EXTREMITY Bilateral 12/14/2020   Procedure: IRRIGATION AND DEBRIDEMENT AND CLOSURE OF LACERTATIONS OF BILATEAL ARMS;  Surgeon: Dwan Bolt, MD;  Location: [facility];  Service: General;  Laterality: Bilateral;   LAPAROTOMY N/A 12/14/2020   Procedure: EXPLORATORY LAPAROTOMY;  Surgeon: Dwan Bolt, MD;  Location: [facility];  Service: General;  Laterality: N/A;   None     RIGHT HEART CATH AND CORONARY ANGIOGRAPHY N/A 08/02/2021   Procedure: RIGHT HEART CATH AND CORONARY ANGIOGRAPHY;  Surgeon: Nelva Bush, MD;  Location: [facility];  Service: Cardiovascular;  Laterality: N/A;   RIGHT/LEFT HEART CATH AND CORONARY ANGIOGRAPHY N/A 12/13/2017   Procedure: RIGHT/LEFT HEART CATH AND CORONARY ANGIOGRAPHY;  Surgeon: Lorretta Harp, MD;  Location: [facility];  Service: Cardiovascular;  Laterality: N/A;    Social History:  Ambulatory   independently      reports that he has been smoking cigarettes. He has been smoking an average of .5 packs per day. He has never used smokeless tobacco. He reports current alcohol use. He reports current drug use. Drugs: Marijuana and Cocaine.     Family History:   Family History  Problem Relation Age of Onset   Heart attack Mother        Died age 45   Heart attack Brother        52   ______________________________________________________________________________________________ Allergies: No Known Allergies   Prior to Admission medications   Medication Sig Start Date End Date Taking? Authorizing Provider  acetaminophen (TYLENOL) 500 MG tablet Take 1 tablet (500 mg total) by mouth every 6 (six) hours as needed. 08/13/21   Domenic Moras, PA-C  albuterol (VENTOLIN HFA) 108 (90 Base) MCG/ACT inhaler Inhale 1-2 puffs into the lungs every 6 (six) hours as needed for wheezing  or shortness of breath. 07/26/21   Azucena Cecil, PA-C  aspirin 81 MG EC tablet Take 1 tablet (81 mg total) by mouth daily. Swallow whole. 08/03/21   Barrett, Evelene Croon, PA-C  atorvastatin (LIPITOR) 80 MG tablet Take 1 tablet (80 mg total) by mouth daily. 08/03/21   Barrett, Evelene Croon, PA-C  carvedilol (COREG) 6.25 MG tablet Take 1 tablet (6.25 mg total) by mouth 2 (two) times daily. 10/06/21   Minus Breeding, MD  citalopram (CELEXA) 20 MG tablet Take 1 tablet (20 mg total) by mouth daily. 05/22/20   Lendon Colonel, NP  cyclobenzaprine (FLEXERIL) 10 MG tablet Take 1 tablet (10 mg total) by mouth 2 (two) times daily as needed for muscle spasms. 08/13/21   Domenic Moras, PA-C  FLOVENT HFA 110 MCG/ACT inhaler Inhale 2 puffs into the lungs 2 (two) times daily. 12/04/19   Minus Breeding, MD  gabapentin (NEURONTIN) 400 MG capsule Take 1 capsule (400 mg total) by mouth daily as needed (nerve pain). 05/22/20   Lendon Colonel, NP  hydrALAZINE (APRESOLINE) 25 MG tablet Take 1 tablet (25 mg total) by mouth every 8 (eight) hours. 08/03/21   Barrett, Evelene Croon, PA-C  nitroGLYCERIN (NITROSTAT) 0.4 MG SL tablet Place 1 tablet (0.4 mg total) under the tongue every 5 (five) minutes as needed  for chest pain. 10/15/21   Minus Breeding, MD  oxyCODONE-acetaminophen (PERCOCET) 5-325 MG tablet Take 1 tablet by mouth every 8 (eight) hours as needed. 10/07/21   Mesner, Corene Cornea, MD  rivaroxaban (XARELTO) 20 MG TABS tablet Take 1 tablet (20 mg total) by mouth daily with supper. 08/03/21   Barrett, Evelene Croon, PA-C  torsemide (DEMADEX) 20 MG tablet Take 1 tablet (20 mg total) by mouth daily. Take an extra tablet daily as needed for edema or weight gain 08/03/21   Barrett, Evelene Croon, PA-C    ___________________________________________________________________________________________________ Physical Exam:    11/01/2021    9:31 PM 11/01/2021    2:53 PM 10/13/2021    9:00 AM  Vitals with BMI  Systolic [id number]  Diastolic 80 84 90   Pulse 66 86 82     1. General:  in No  Acute distress   Chronically ill  -appearing 2. Psychological:lethargic but Oriented 3. Head/ENT:   Moist  Mucous Membranes                          Head Non traumatic, neck supple                          Poor Dentition 4. SKIN: normal  Skin turgor,  Skin clean Dry and intact no rash 5. Heart: Regular rate and rhythm no  Murmur, no Rub or gallop 6. Lungs  no wheezes occasional crackles   7. Abdomen: Soft,  non-tender, Non distended    obese  bowel sounds present 8. Lower extremities: no clubbing, cyanosis, trace edema 9. Neurologically Grossly intact, moving all 4 extremities equally   10. MSK: Normal range of motion    Chart has been reviewed  ______________________________________________________________________________________________  Assessment/Plan 61 y.o. male with medical history significant of diastolic CHF, etoH abuse, cocaine abuse     Admitted for   Nonspecific chest pain ACUTE ON [place]ORNIC combined CHF exacerbation, elevated troponin      Present on Admission:  Acute on chronic combined systolic and diastolic CHF (congestive heart failure) (HCC)  Chest pain  Cocaine abuse (HCC)  COPD (chronic obstructive pulmonary disease) (HCC)  Elevated troponin  Tobacco abuse     Acute on chronic combined systolic and diastolic CHF (congestive heart failure) ([place]) - Pt diagnosed with CHF based on presence of the following:  rales on exam  cardiomegaly,   admit on telemetry,  cycle cardiac enzymes, Troponin 79 - 300 - 800   obtain serial ECG  to evaluate for ischemia as a cause of heart failure  monitor daily weight: There were no vitals filed for this visit. Last BNP BNP (last 3 results) Recent Labs    08/13/21 1431 10/07/21 0634 10/13/21 0408  BNP 882.0* 787.8* 1,451.1*    diurese with IV lasix 20mg  iV q day low bp unable to diuerese more and monitor orthostatics and creatinine to avoid over diuresis.  Order echogram to  evaluate EF and valves  ACE/ARBi  Contraindicated    cardiology consulted seen in ER   Chest pain Cardiology aware No plan to cath had recent cardiac cath Elevated trop in the setting of severe systolic chf and cocaine  abuse  rec diurese and follow trop Change from xarelto to lovenox as per Cardiology   Cocaine abuse ([place]) counseled on quiting  COPD (chronic obstructive pulmonary disease) ([place]) Stable continue home meds    Elevated troponin In the sttin go  f systolic chf and coacie rpeat echo  cont to trend watch on tele Pt already on xarelto   cardiology rec change to lovenox Cardiology are aware  recent cath minimal disease    Tobacco abuse  - Spoke about importance of quitting spent 5 minutes discussing options for treatment, prior attempts at quitting, and dangers of smoking  -At this point patient is  NOT  interested in quitting  - order nicotine patch   - nursing tobacco cessation protocol   Other plan as per orders.  DVT prophylaxis:  xarelto    Code Status:    Code Status: Prior FULL CODE   as per patient  I had personally discussed CODE STATUS with patient      Family Communication:   Family not at  Bedside    Disposition Plan:         To home once workup is complete and patient is stable   Following barriers for discharge:                           CP work up completed                           Will need consultants to evaluate patient prior to discharge                        Transition of care consulted                                      Consults called: cardiology are aware  Admission status:  ED Disposition     ED Disposition  Admit   Condition  --   Comment  Hospital Area: [facility] [[id number]]  Level of Care: Progressive [102]  Admit to Progressive based on following criteria: CARDIOVASCULAR & THORACIC of moderate stability with acute coronary syndrome symptoms/low risk myocardial infarction/hypertensive  urgency/arrhythmias/heart failure potentially compromising stability and stable post cardiovascular intervention patients.  May place patient in observation at [facility] or [facility] if equivalent level of care is available:: No  Covid Evaluation: Asymptomatic - no recent exposure (last 10 days) testing not required  Diagnosis: Acute on chronic combined systolic and diastolic CHF (congestive heart failure) [facility]) [[id number]]  Admitting Physician: Therisa Doyne [[id number]]  Attending Physician: Therisa Doyne [[id number]]           Obs      Level of care        progressive tele indefinitely please discontinue once patient no longer qualifies COVID-19 Labs    Blakeleigh Domek 11/02/2021, 1:09 AM    [facility]     after 2 AM please page floor coverage PA If 7AM-7PM, please contact the day team taking care of the patient using [email or web address]   Patient was evaluated in the context of the global COVID-19 pandemic, which necessitated consideration that the patient might be at risk for infection with the SARS-CoV-2 virus that causes COVID-19. Institutional protocols and algorithms that pertain to the evaluation of patients at risk for COVID-19 are in a state of rapid change based on information released by regulatory bodies including the CDC and federal and state organizations. These policies and algorithms were followed during the patient's care.

## 2021-11-02 NOTE — Progress Notes (Signed)
ANTICOAGULATION CONSULT NOTE - Initial Consult  Pharmacy Consult for heparin Indication: chest pain/ACS and recent mural thrombus  No Known Allergies  Patient Measurements: Height: 6' (182.9 cm) Weight: 81.6 kg (180 lb) IBW/kg (Calculated) : 77.6  Vital Signs: BP: 100/80 (07/24 2131) Pulse Rate: 66 (07/24 2131)  Labs: Recent Labs    11/01/21 1527 11/01/21 1805 11/01/21 2330 11/02/21 0104 11/02/21 0315  HGB 12.7*  --   --   --  13.7  HCT 38.9*  --   --   --  41.6  PLT 187  --   --   --  188  CREATININE 0.92  --   --   --  1.09  CKTOTAL  --   --   --  214  --   TROPONINIHS 36*   < > 823* 1,229* 2,311*   < > = values in this interval not displayed.    Estimated Creatinine Clearance: 77.1 mL/min (by C-G formula based on SCr of 1.09 mg/dL).   Medical History: Past Medical History:  Diagnosis Date   Acid reflux    Alcohol abuse    Arthritis    Asthma    Back pain    Bronchitis    Chest pain 07/31/2021   CHF (congestive heart failure) (HCC)    CKD (chronic kidney disease)    CKD (chronic kidney disease), stage III (HCC)    Cocaine abuse (HCC)    COPD (chronic obstructive pulmonary disease) (HCC)    History of noncompliance with medical treatment, presenting hazards to health    Homelessness    Hypertension    LV (left ventricular) mural thrombus    Myocardial infarction (HCC)    Neuropathic pain    NICM (nonischemic cardiomyopathy) (HCC) 2019   NSTEMI (non-ST elevated myocardial infarction) (HCC) 07/31/2021   NSVT (nonsustained ventricular tachycardia) (HCC)    Pericardial effusion    Pulmonary hypertension (HCC)    RVF (right ventricular failure) (HCC)    Schizo affective schizophrenia (HCC)     Assessment: 60yo male on Xarelto for recent mural thrombus (Apr 2023) presents w/ CP, now CP free, troponin rising (36>79>318>823>1229>2311) >> to start heparin.  Pt states his last Xarelto dose was "yesterday morning" but couldn't clarify 7/24 vs 7/23.  Goal of  Therapy:  Heparin level 0.3-0.7 units/ml aPTT 66-102 seconds Monitor platelets by anticoagulation protocol: Yes   Plan:  Heparin infusion at 1500 units/hr (based on requirements during recent admission). Monitor heparin levels, aPTT (while Xarelto affects anti-Xa assay), and CBC.  Vernard Gambles, PharmD, BCPS  11/02/2021,4:45 AM

## 2021-11-02 NOTE — ED Notes (Signed)
Pt to echo.

## 2021-11-02 NOTE — Assessment & Plan Note (Signed)
Stable - continue home meds 

## 2021-11-02 NOTE — Progress Notes (Signed)
Heart Failure Navigator Progress Note  Following this hospitalization to assess for HV TOC readiness.   Echo pending? Last EF 20-25% G3DD  BNP 1,448 Trop 2,311  Rhae Hammock, BSN, RN Heart Failure Print production planner Chat Only

## 2021-11-02 NOTE — Consult Note (Signed)
Cardiology Consult    Patient ID: Allen Mayer MRN: [id number], DOB/AGE: 63-May-1962   Admit date: 11/01/2021 Date of Consult: 11/02/2021 Requesting Provider: Cheryll Cockayne, MD  PCP:  Aviva Kluver   [facility] Providers Cardiologist:  Rollene Rotunda, MD   Patient Profile    Allen Mayer is a 61 y.o. homeless male with a history of HFrEF due to NICM, LV thrombus (on Xarelto), schizophrenia, COPD, and cocaine abuse. He is being seen today (11/02/2021) for the evaluation of chest pain.  History of Present Illness    Mr. Girtman states that he started having chest pain again this afternoon while at rest. Since then it has been somewhat waxing and waning. He has not exerted himself since it started, but it is not pleuritic or very positional. It does not radiate. States his dyspnea has been at baseline. He has noted swelling and tenderness in his left knee specifically but no lower extremity edema. He has had a mild non-productive cough but no fevers or chills. He continues to use cocaine and says last use was 3-4 days ago. He has multiple complaints of joint pains that have been previously noted, currently primarily his left knee. He has been taking Torsemide 20mg  as instructed, sometimes double dose if he gets leg swelling.   He reports the pain is very similar to what he experienced back in April when he was admitted for the same presentation - this episode is perhaps less severe. At that time his troponin Jaquawn Saffran from 159 to 1900 in 5 hours. EF was further decreased to 20-25% with restrictive filling. He underwent right and left heart cath that showed minimal non-obstructive CAD though on my review there was TIMI 2 flow diffusely (not mentioned in report). Wedge pressure was 16, but right-heart pressures were normal.  He was not started on any RAAS inhibitors due to AKI.  Past Medical History   Past Medical History:  Diagnosis Date   Acid reflux    Alcohol abuse    Arthritis     Asthma    Back pain    Bronchitis    Chest pain 07/31/2021   CHF (congestive heart failure) (HCC)    CKD (chronic kidney disease)    CKD (chronic kidney disease), stage III (HCC)    Cocaine abuse (HCC)    COPD (chronic obstructive pulmonary disease) (HCC)    History of noncompliance with medical treatment, presenting hazards to health    Homelessness    Hypertension    LV (left ventricular) mural thrombus    Myocardial infarction (HCC)    Neuropathic pain    NICM (nonischemic cardiomyopathy) (HCC) 2019   NSTEMI (non-ST elevated myocardial infarction) (HCC) 07/31/2021   NSVT (nonsustained ventricular tachycardia) (HCC)    Pericardial effusion    Pulmonary hypertension (HCC)    RVF (right ventricular failure) (HCC)    Schizo affective schizophrenia (HCC)     Past Surgical History:  Procedure Laterality Date   I & D EXTREMITY Bilateral 12/14/2020   Procedure: IRRIGATION AND DEBRIDEMENT AND CLOSURE OF LACERTATIONS OF BILATEAL ARMS;  Surgeon: 02/13/2021, MD;  Location: [facility];  Service: General;  Laterality: Bilateral;   LAPAROTOMY N/A 12/14/2020   Procedure: EXPLORATORY LAPAROTOMY;  Surgeon: 02/13/2021, MD;  Location: [facility] OR;  Service: General;  Laterality: N/A;   None     RIGHT HEART CATH AND CORONARY ANGIOGRAPHY N/A 08/02/2021   Procedure: RIGHT HEART CATH AND CORONARY ANGIOGRAPHY;  Surgeon: 08/04/2021, MD;  Location:  [facility];  Service: Cardiovascular;  Laterality: N/A;   RIGHT/LEFT HEART CATH AND CORONARY ANGIOGRAPHY N/A 12/13/2017   Procedure: RIGHT/LEFT HEART CATH AND CORONARY ANGIOGRAPHY;  Surgeon: Lorretta Harp, MD;  Location: [facility];  Service: Cardiovascular;  Laterality: N/A;     No Known Allergies Inpatient Medications     aspirin  325 mg Oral Daily    Family History    Family History  Problem Relation Age of Onset   Heart attack Mother        Died age 60   Heart attack Brother        4   He indicated that his mother is  deceased. He indicated that his father is deceased. He indicated that his brother is alive.   Social History    Social History   Socioeconomic History   Marital status: Married    Spouse name: Not on file   Number of children: Not on file   Years of education: Not on file   Highest education level: Not on file  Occupational History   Not on file  Tobacco Use   Smoking status: Every Day    Packs/day: 0.50    Types: Cigarettes   Smokeless tobacco: Never  Vaping Use   Vaping Use: Never used  Substance and Sexual Activity   Alcohol use: Yes    Comment: occasionally   Drug use: Yes    Types: Marijuana, Cocaine   Sexual activity: Not on file  Other Topics Concern   Not on file  Social History Narrative   ** Merged History Encounter **       Lives with fiance.     Social Determinants of Health   Financial Resource Strain: Not on file  Food Insecurity: Not on file  Transportation Needs: Not on file  Physical Activity: Not on file  Stress: Not on file  Social Connections: Not on file  Intimate Partner Violence: Not on file     Review of Systems    A comprehensive ROS was obtained with pertinent positives and negatives noted in the HPI.  Physical Exam    Blood pressure 100/80, pulse 66, temperature (!) 97.5 F (36.4 C), temperature source Oral, resp. rate 16, SpO2 100 %.     Intake/Output Summary (Last 24 hours) at 11/02/2021 0018 Last data filed at 11/01/2021 2300 Gross per 24 hour  Intake --  Output 300 ml  Net -300 ml   Wt Readings from Last 3 Encounters:  10/13/21 81.6 kg  08/13/21 81.6 kg  07/31/21 80.3 kg    CONSTITUTIONAL: alert and conversant, well-appearing, nourished, no distress HEENT: normal, somewhat poor dentition NECK: no masses, JVP elevated to 10cm CARDIAC: Regular rhythm. S3 present. No murmur. No friction rub.  VASCULAR: Radial pulses intact bilaterally. No carotid bruits. PULMONARY/CHEST WALL: no deformities, normal breath sounds  bilaterally, normal work of breathing ABDOMINAL: soft, non-tender, non-distended EXTREMITIES: no edema, effusion of left knee with associated warmth but no erythema or drainage tract, skin warm and well-perfused SKIN: Dry and intact without apparent rashes or wounds. No peripheral cyanosis. NEUROLOGIC: alert, no abnormal movements, cranial nerves grossly intact. PSYCH: normal affect, normal speech and language   Labs    Recent Labs    11/01/21 1527 11/01/21 1805 11/01/21 2050  TROPONINIHS 36* 79* 318*   Lab Results  Component Value Date   WBC 7.5 11/01/2021   HGB 12.7 (L) 11/01/2021   HCT 38.9 (L) 11/01/2021   MCV 87.4  11/01/2021   PLT 187 11/01/2021    Recent Labs  Lab 11/01/21 1527  NA 135  K 4.0  CL 102  CO2 24  BUN 18  CREATININE 0.92  CALCIUM 8.5*  PROT 6.9  BILITOT 1.2  ALKPHOS 82  ALT 35  AST 26  GLUCOSE 102*   Lab Results  Component Value Date   CHOL 106 07/31/2021   HDL 28 (L) 07/31/2021   LDLCALC 69 07/31/2021   TRIG 45 07/31/2021   Lab Results  Component Value Date   DDIMER 0.70 (H) 10/24/2020   Recent Labs    08/13/21 1431 10/07/21 0634 10/13/21 0408  BNP 882.0* 787.8* 1,451.1*   No results for input(s): "PROBNP" in the last 8760 hours.    Radiology Studies    DG Chest 1 View Result Date: 11/01/2021 FINDINGS: Unchanged enlarged cardiac silhouette. Normal mediastinal contours. Improved right basilar atelectasis. No new focal pulmonary opacity. No pleural effusion or pneumothorax. No acute osseous abnormality.  IMPRESSION: Cardiomegaly with improved right basilar atelectasis.   ECG & Cardiac Imaging   ECG: sinus rhythm, inferior and anterior infarct patterns (old), nonspecific anterolateral ST-T wave changes - personally reviewed.   R/L HEART CATH: 08/02/2021 Mild, non-obstructive coronary artery disease with very sluggish (TIMI II) flow diffusely Normal left and right heart filling pressures. Moderately-severely reduced Fick  cardiac output/index.    ECHO: 08/01/2021  1. Large mural thrombus at the apex and separate thrombus associated with  the posterior mitral leaflet/mitral commisure. Left ventricular ejection  fraction, by estimation, is 20 to 25%. The left ventricle has severely  decreased function. The left ventricle demonstrates global hypokinesis. The left ventricular internal cavity size was severely dilated. Left ventricular diastolic parameters are consistent with Grade III diastolic dysfunction (restrictive). Elevated left ventricular end-diastolic pressure. The E/e' is 60.   2. Right ventricular systolic function is low normal. The right  ventricular size is mildly enlarged.   3. The mitral valve is abnormal. Trivial mitral valve regurgitation.   4. The aortic valve is tricuspid. Aortic valve regurgitation is not  visualized.   5. The inferior vena cava is normal in size with greater than 50%  respiratory variability, suggesting right atrial pressure of 3 mmHg.    Assessment & Plan    MINOCA - similar presentation to that in April with uptrending troponin, chest pain, and coronary angiography that showed minimal non-obstructive disease but diffuse TIMI II flow highly suggestive of microvascular disease. I suspect both his cardiomyopathy and cocaine abuse are contributing to this process. Given the this insight from his last recent admission, I do not see any indication for repeat coronary angiography and favor medical optimization as well as complete cessation of cocaine use.  Subacute on chronic HFrEF due to NICM: symptoms are stable but JVP appears elevated and S3 is present. May benefit from at least a trial of diuresis. Cocaine abuse, ongoing - contributing to #1 and 2 LV thrombus: diagnosed in April, at which time he was started on Xarelto due to concerns about compliance with warfarin. No findings concerning for cardioembolism at this time.  - Diurese for goal negative 1-2 L (can give Lasix 40mg   IV to start) - Continue to trend troponin and check BNP - Continue aspirin and change Xarelto to therapeutic Lovenox for now, pending further work-up, though very high threshold to repeat coronary angiography as noted above.  - Continue atorvastatin 80mg  - Start losartan 12.5mg  daily and uptitrate as able. - Decrease carvedilol to 3.125mg  BID -  Discontinue home hydralazine - Needs to stop using cocaine! I discussed this with him again.    Signed, Clerance Lav, MD 11/02/2021, 12:18 AM  For questions or updates, please contact   Please consult [email or web address] for contact info under Cardiology/STEMI.

## 2021-11-02 NOTE — Progress Notes (Signed)
PT Cancellation Note  Patient Details Name: Xaiden Fleig MRN: [id number] DOB: [birth year]   Cancelled Treatment:    Reason Eval/Treat Not Completed: Other (comment).  Pt has been seen by OT but refused PT, stating he just does not feel like doing therapy now.  Retry at another time.   Ivar Drape 11/02/2021, 12:14 PM  Samul Dada, PT PhD Acute Rehab Dept. Number: [facility] [id number] and [facility] [phone]

## 2021-11-02 NOTE — Progress Notes (Signed)
ANTICOAGULATION CONSULT NOTE  Pharmacy Consult for heparin Indication: chest pain/ACS and recent mural thrombus  No Known Allergies  Patient Measurements: Height: 6' (182.9 cm) Weight: 78.8 kg (173 lb 11.6 oz) IBW/kg (Calculated) : 77.6  Vital Signs: Temp: 98.1 F (36.7 C) (07/25 1254) Temp Source: Oral (07/25 1254) BP: 127/99 (07/25 1254) Pulse Rate: 94 (07/25 1254)  Labs: Recent Labs    11/01/21 1527 11/01/21 1805 11/01/21 2330 11/02/21 0104 11/02/21 0315 11/02/21 1315  HGB 12.7*  --   --   --  13.7  --   HCT 38.9*  --   --   --  41.6  --   PLT 187  --   --   --  188  --   HEPARINUNFRC  --   --   --   --   --  0.22*  CREATININE 0.92  --   --   --  1.09  --   CKTOTAL  --   --   --  214  --   --   TROPONINIHS 36*   < > 823* 1,229* 2,311*  --    < > = values in this interval not displayed.     Estimated Creatinine Clearance: 77.1 mL/min (by C-G formula based on SCr of 1.09 mg/dL).   Medical History: Past Medical History:  Diagnosis Date   Acid reflux    Alcohol abuse    Arthritis    Asthma    Back pain    Bronchitis    Chest pain 07/31/2021   CHF (congestive heart failure) (HCC)    CKD (chronic kidney disease)    CKD (chronic kidney disease), stage III (HCC)    Cocaine abuse (HCC)    COPD (chronic obstructive pulmonary disease) (HCC)    History of noncompliance with medical treatment, presenting hazards to health    Homelessness    Hypertension    LV (left ventricular) mural thrombus    Myocardial infarction [facility])    Neuropathic pain    NICM (nonischemic cardiomyopathy) (HCC) 2019   NSTEMI (non-ST elevated myocardial infarction) (HCC) 07/31/2021   NSVT (nonsustained ventricular tachycardia) (HCC)    Pericardial effusion    Pulmonary hypertension (HCC)    RVF (right ventricular failure) (HCC)    Schizo affective schizophrenia (HCC)     Assessment: 60yo male on Xarelto for recent mural thrombus (Apr 2023) presents w/ CP, now CP free but troponin  rising (36>79>318>823>1229>2311). Pharmacy consulted to start heparin.  Pt states his last Xarelto dose was "yesterday morning" but couldn't clarify 7/24 vs 7/23.  Initial heparin level and aPTT both subtherapeutic at 0.22 and 44 seconds - close to correlating. CBC stable this am.  Goal of Therapy:  Heparin level 0.3-0.7 units/ml aPTT 66-102 seconds Monitor platelets by anticoagulation protocol: Yes   Plan:  -Increase heparin to 1700 units/h -Recheck aPTT in 6h  -Daily aPTT and heparin level - will likely be able to dose via heparin levels tomorrow  Fredonia Highland, PharmD, BCPS, [facility] Clinical Pharmacist [phone] Please check AMION for all [facility] numbers 11/02/2021
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
## 2021-11-02 NOTE — Assessment & Plan Note (Signed)
-   Pt diagnosed with CHF based on presence of the following:  rales on exam  cardiomegaly,   admit on telemetry,  cycle cardiac enzymes, Troponin 79 - 300 - 800   obtain serial ECG  to evaluate for ischemia as a cause of heart failure  monitor daily weight: There were no vitals filed for this visit. Last BNP BNP (last 3 results) Recent Labs    08/13/21 1431 10/07/21 0634 10/13/21 0408  BNP 882.0* 787.8* 1,451.1*    diurese with IV lasix 20mg  iV q day low bp unable to diuerese more and monitor orthostatics and creatinine to avoid over diuresis.  Order echogram to evaluate EF and valves  ACE/ARBi  Contraindicated    cardiology consulted seen in ER

## 2021-11-02 NOTE — Assessment & Plan Note (Addendum)
In the sttin go f systolic chf and coacie rpeat echo  cont to trend watch on tele Pt already on xarelto   cardiology rec change to lovenox Cardiology are aware  recent cath minimal disease

## 2021-11-02 NOTE — Progress Notes (Signed)
Patient seen and examined, admitted earlier this morning by Dr. Adela Glimpse, please see her H&P for details briefly 60/M with history of homelessness, polysubstance abuse including EtOH and cocaine, NICM, LV thrombus, tobacco use, COPD presented to [facility] ED with chest pain, noted to have progressively elevated troponin up to 2300  NSTEMI -Likely secondary to cocaine induced vasospasm versus thrombotic event in the background of LV thrombus -Cards following, recommended medical management, cath in April noted minimal nonobstructive CAD -Continue aspirin, statin, beta-blocker discontinued, starting heparin -Cocaine cessation counseled  Acute on chronic systolic CHF -Echo with LVEF <20%, dilated LV and severe RV dysfunction, suspected to be cocaine related, right heart cath 4/23 noted low output heart failure -Limited options, prognosis is poor -Hold off on beta-blocker acutely, starting losartan  History of LV thrombus -Not seen on today's echo, continue IV heparin Xarelto on hold  Polysubstance abuse Cocaine, alcohol and tobacco -Counseled again, discussed with patient and spouse at length  Homelessness -CSW consult, lives in a shelter  GOC: Patient has advanced cardiomyopathy, right heart cath in April was concerning for low low output heart failure, not a candidate for advanced therapies, medical management recommended, he has very limited insight, complicated by polysubstance abuse and homelessness  Zannie Cove, MD

## 2021-11-03 ENCOUNTER — Encounter (HOSPITAL_COMMUNITY): Payer: Self-pay | Admitting: Internal Medicine

## 2021-11-03 DIAGNOSIS — I214 Non-ST elevation (NSTEMI) myocardial infarction: Secondary | ICD-10-CM | POA: Diagnosis present

## 2021-11-03 DIAGNOSIS — J431 Panlobular emphysema: Secondary | ICD-10-CM | POA: Diagnosis not present

## 2021-11-03 DIAGNOSIS — F1410 Cocaine abuse, uncomplicated: Secondary | ICD-10-CM | POA: Diagnosis not present

## 2021-11-03 DIAGNOSIS — I5043 Acute on chronic combined systolic (congestive) and diastolic (congestive) heart failure: Secondary | ICD-10-CM | POA: Diagnosis not present

## 2021-11-03 LAB — BASIC METABOLIC PANEL
Anion gap: 8 (ref 5–15)
BUN: 17 mg/dL (ref 8–23)
CO2: 24 mmol/L (ref 22–32)
Calcium: 8.7 mg/dL — ABNORMAL LOW (ref 8.9–10.3)
Chloride: 101 mmol/L (ref 98–111)
Creatinine, Ser: 0.95 mg/dL (ref 0.61–1.24)
GFR, Estimated: >60 mL/min (ref >60)
Glucose, Bld: 130 mg/dL — ABNORMAL HIGH (ref 70–99)
Potassium: 3.6 mmol/L (ref 3.5–5.1)
Sodium: 133 mmol/L — ABNORMAL LOW (ref 135–145)

## 2021-11-03 LAB — CBC
HCT: 40.5 % (ref 39.0–52.0)
Hemoglobin: 13.5 g/dL (ref 13.0–17.0)
MCH: 28.8 pg (ref 26.0–34.0)
MCHC: 33.3 g/dL (ref 30.0–36.0)
MCV: 86.5 fL (ref 80.0–100.0)
Platelets: 161 10*3/uL (ref 150–400)
RBC: 4.68 MIL/uL (ref 4.22–5.81)
RDW: 14.4 % (ref 11.5–15.5)
WBC: 5.9 10*3/uL (ref 4.0–10.5)
nRBC: 0 % (ref 0.0–0.2)

## 2021-11-03 LAB — HEPARIN LEVEL (UNFRACTIONATED)
Heparin Unfractionated: 0.32 IU/mL (ref 0.30–0.70)
Heparin Unfractionated: 0.38 IU/mL (ref 0.30–0.70)

## 2021-11-03 LAB — APTT
aPTT: 61 seconds — ABNORMAL HIGH (ref 24–36)
aPTT: 69 seconds — ABNORMAL HIGH (ref 24–36)

## 2021-11-03 MED ORDER — SPIRONOLACTONE 12.5 MG HALF TABLET
12.5000 mg | ORAL_TABLET | Freq: Every day | ORAL | Status: DC
  Administered 2021-11-03 – 2021-11-04 (×2): 12.5 mg via ORAL
  Filled 2021-11-03 (×2): qty 1

## 2021-11-03 MED ORDER — DIGOXIN 125 MCG PO TABS
0.1250 mg | ORAL_TABLET | Freq: Every day | ORAL | Status: DC
  Administered 2021-11-03 – 2021-11-04 (×2): 0.125 mg via ORAL
  Filled 2021-11-03 (×2): qty 1

## 2021-11-03 MED ORDER — TORSEMIDE 20 MG PO TABS
20.0000 mg | ORAL_TABLET | Freq: Every day | ORAL | Status: DC
  Administered 2021-11-03 – 2021-11-04 (×2): 20 mg via ORAL
  Filled 2021-11-03 (×2): qty 1

## 2021-11-03 MED ORDER — ONDANSETRON HCL 4 MG/2ML IJ SOLN
4.0000 mg | Freq: Once | INTRAMUSCULAR | Status: AC | PRN
  Administered 2021-11-03: 4 mg via INTRAVENOUS
  Filled 2021-11-03: qty 2

## 2021-11-03 NOTE — Assessment & Plan Note (Signed)
Smoking cessation counseling 
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
## 2021-11-03 NOTE — Assessment & Plan Note (Signed)
Acute cocaine intoxication Continue supportive medical therapy.

## 2021-11-03 NOTE — Plan of Care (Signed)

## 2021-11-03 NOTE — Progress Notes (Signed)
Cardiology Progress Note  Patient ID: Allen Mayer MRN: [id number] DOB: [birth year] Date of Encounter: 11/03/2021  Primary Cardiologist: Minus Breeding, MD  Subjective   Chief Complaint: Leg pain  HPI: Further chest pain on heparin.  LV thrombus has resolved.  ROS:  All other ROS reviewed and negative. Pertinent positives noted in the HPI.     Inpatient Medications  Scheduled Meds:  aspirin  81 mg Oral Daily   atorvastatin  80 mg Oral Daily   citalopram  20 mg Oral Daily   digoxin  0.125 mg Oral Daily   furosemide  20 mg Intravenous Daily   losartan  25 mg Oral Daily   nicotine  21 mg Transdermal Daily   sodium chloride flush  3 mL Intravenous Q12H   spironolactone  12.5 mg Oral Daily   Continuous Infusions:  sodium chloride     heparin 1,700 Units/hr (11/03/21 0316)   PRN Meds: sodium chloride, acetaminophen **OR** acetaminophen, albuterol, gabapentin, HYDROcodone-acetaminophen, sodium chloride flush   Vital Signs   Vitals:   11/02/21 2247 11/03/21 0022 11/03/21 0555 11/03/21 0722  BP: 100/83 101/83 108/88 121/80  Pulse: 80 89 99 79  Resp: 20 20 20 20   Temp: 97.6 F (36.4 C) 97.6 F (36.4 C) 98.1 F (36.7 C) 98.2 F (36.8 C)  TempSrc: Oral Oral Oral Oral  SpO2: 98% 93% 93% 97%  Weight:  78 kg    Height:        Intake/Output Summary (Last 24 hours) at 11/03/2021 1005 Last data filed at 11/03/2021 0316 Gross per 24 hour  Intake 759.14 ml  Output 600 ml  Net 159.14 ml      11/03/2021   12:22 AM 11/02/2021   12:54 PM 11/02/2021    1:00 AM  Last 3 Weights  Weight (lbs) 172 lb 173 lb 11.6 oz 180 lb  Weight (kg) 78.019 kg 78.8 kg 81.647 kg      Telemetry  Overnight telemetry shows SR 90s, which I personally reviewed.   ECG  The most recent ECG shows SR 89, nonspecific ST-T changes, which I personally reviewed.   Physical Exam   Vitals:   11/02/21 2247 11/03/21 0022 11/03/21 0555 11/03/21 0722  BP: 100/83 101/83 108/88 121/80  Pulse: 80 89 99  79  Resp: 20 20 20 20   Temp: 97.6 F (36.4 C) 97.6 F (36.4 C) 98.1 F (36.7 C) 98.2 F (36.8 C)  TempSrc: Oral Oral Oral Oral  SpO2: 98% 93% 93% 97%  Weight:  78 kg    Height:        Intake/Output Summary (Last 24 hours) at 11/03/2021 1005 Last data filed at 11/03/2021 0316 Gross per 24 hour  Intake 759.14 ml  Output 600 ml  Net 159.14 ml       11/03/2021   12:22 AM 11/02/2021   12:54 PM 11/02/2021    1:00 AM  Last 3 Weights  Weight (lbs) 172 lb 173 lb 11.6 oz 180 lb  Weight (kg) 78.019 kg 78.8 kg 81.647 kg    Body mass index is 23.33 kg/m.  General: Well nourished, well developed, in no acute distress Head: Atraumatic, normal size  Eyes: PEERLA, EOMI  Neck: Supple, no JVD Endocrine: No thryomegaly Cardiac: Normal S1, S2; RRR; no murmurs, rubs, or gallops Lungs: Clear to auscultation bilaterally, no wheezing, rhonchi or rales  Abd: Soft, nontender, no hepatomegaly  Ext: No edema, pulses 2+ Musculoskeletal: No deformities, BUE and BLE strength normal and equal Skin: Warm and dry, no  rashes   Neuro: Alert and oriented to person, place, time, and situation, CNII-XII grossly intact, no focal deficits  Psych: Normal mood and affect   Labs  High Sensitivity Troponin:   Recent Labs  Lab 11/01/21 1805 11/01/21 2050 11/01/21 2330 11/02/21 0104 11/02/21 0315  TROPONINIHS 79* 318* 823* 1,229* 2,311*     Cardiac EnzymesNo results for input(s): "TROPONINI" in the last 168 hours. No results for input(s): "TROPIPOC" in the last 168 hours.  Chemistry Recent Labs  Lab 11/01/21 1527 11/02/21 0104 11/02/21 0315  NA 135  --  138  K 4.0  --  3.7  CL 102  --  101  CO2 24  --  29  GLUCOSE 102*  --  105*  BUN 18  --  15  CREATININE 0.92  --  1.09  CALCIUM 8.5*  --  9.0  PROT 6.9 6.7 7.4  ALBUMIN 3.1* 2.7* 3.0*  AST 26 34 41  ALT 35 31 32  ALKPHOS 82 73 80  BILITOT 1.2 1.5* 1.7*  GFRNONAA >60  --  >60  ANIONGAP 9  --  8    Hematology Recent Labs  Lab 11/01/21 1527  11/02/21 0315 11/03/21 0143  WBC 7.5 6.4 5.9  RBC 4.45 4.75 4.68  HGB 12.7* 13.7 13.5  HCT 38.9* 41.6 40.5  MCV 87.4 87.6 86.5  MCH 28.5 28.8 28.8  MCHC 32.6 32.9 33.3  RDW 14.2 14.3 14.4  PLT 187 188 161   BNP Recent Labs  Lab 11/02/21 0315  BNP 1,448.9*    DDimer No results for input(s): "DDIMER" in the last 168 hours.   Radiology  DG Knee 1-2 Views Left  Result Date: 11/02/2021 CLINICAL DATA:  Left knee pain, swelling and fluid for 1 year. EXAM: LEFT KNEE - 1-2 VIEW COMPARISON:  Left knee radiographs 10/07/2021 FINDINGS: Moderate medial compartment joint space narrowing and peripheral osteophytosis. Moderate to large joint effusion is similar to prior. There are 2 chronic ossicles again at the superior aspect of the tibial tubercle, likely the sequela of remote Osgood-Schlatter disease. IMPRESSION: Moderate medial compartment osteoarthritis. Moderate to large joint effusion. Unchanged ossicles at the superior aspect of the tibial tubercle consistent with chronic Osgood-Schlatter disease. Electronically Signed   By: Yvonne Kendall M.D.   On: 11/02/2021 14:15   ECHOCARDIOGRAM LIMITED  Result Date: 11/02/2021    ECHOCARDIOGRAM LIMITED REPORT   Patient Name:   Allen Mayer Date of Exam: 11/02/2021 Medical Rec #:  [id number]        Height:       72.0 in Accession #:    [id number]       Weight:       180.0 lb Date of Birth:  1961-01-16       BSA:          2.037 m Patient Age:    61 years         BP:           137/105 mmHg Patient Gender: M                HR:           94 bpm. Exam Location:  Inpatient Procedure: Limited Echo and Intracardiac Opacification Agent Indications:     Left ventricle Mural Thrombus  History:         Patient has prior history of Echocardiogram examinations, most                  recent  11/02/2021. CHF, COPD and Pulmonary HTN; Risk                  Factors:Hypertension and Current Smoker. Polysubstance Abuse.  Sonographer:     Johny Chess RDCS Referring Phys:   [place] Diagnosing Phys: [name] IMPRESSIONS  1. LVEF severely reduced EF < 20%. With contrast, no evidence of LV thrombus. FINDINGS  Left Ventricle: LVEF severely reduced EF < 20%. With contrast, no evidence of LV thrombus. IVC IVC diam: 1.60 cm [name] Electronically signed by [name] Signature Date/Time: 11/02/2021/12:21:53 PM    Final (Updated)    ECHOCARDIOGRAM COMPLETE  Result Date: 11/02/2021    ECHOCARDIOGRAM REPORT   Patient Name:   ARSHAAN HOSTLER Date of Exam: 11/02/2021 Medical Rec #:  [id number]        Height:       72.0 in Accession #:    [id number]       Weight:       180.0 lb Date of Birth:  1960-11-11       BSA:          2.037 m Patient Age:    89 years         BP:           137/105 mmHg Patient Gender: M                HR:           93 bpm. Exam Location:  Inpatient Procedure: 2D Echo Indications:    acute systolic chf  History:        Patient has prior history of Echocardiogram examinations, most                 recent 08/01/2021. Cardiomyopathy, COPD, Signs/Symptoms:Chest                 Pain; Risk Factors:Current Smoker and cocaine use.  Sonographer:    Johny Chess RDCS Referring Phys: [id number] ANASTASSIA DOUTOVA  Sonographer Comments: Image acquisition challenging due to uncooperative patient. IMPRESSIONS  1. Spontaneous contrast is visualized, challenging to definitively rule out LV thrombus without contrast. Compared to prior study, no large thrombus is seen. Left ventricular ejection fraction, by estimation, is <20%. The left ventricle has severely decreased function. The left ventricle demonstrates global hypokinesis. The left ventricular internal cavity size was moderately dilated. Left ventricular diastolic parameters are indeterminate.  2. Right ventricular systolic function is severely reduced. The right ventricular size is mildly enlarged.  3. Left atrial size was moderately dilated.  4. Right atrial size was moderately dilated.  5. The mitral valve is normal  in structure. Mild mitral valve regurgitation.  6. The aortic valve is normal in structure. Aortic valve regurgitation is not visualized.  7. IVC not visualized. Conclusion(s)/Recommendation(s): Large LV thrombus has resolved compared to prior study. FINDINGS  Left Ventricle: Spontaneous contrast is visualized, challenging to definitively rule out LV thrombus without contrast. Compared to prior study, no large thrombus is seen. Left ventricular ejection fraction, by estimation, is <20%. The left ventricle has  severely decreased function. The left ventricle demonstrates global hypokinesis. The left ventricular internal cavity size was moderately dilated. There is no left ventricular hypertrophy. Left ventricular diastolic parameters are indeterminate. Right Ventricle: The right ventricular size is mildly enlarged. Right ventricular systolic function is severely reduced. Left Atrium: Left atrial size was moderately dilated. Right Atrium: Right atrial size was moderately dilated. Pericardium: There is no evidence of pericardial effusion. Mitral  Valve: The mitral valve is normal in structure. Mild mitral valve regurgitation. Tricuspid Valve: The tricuspid valve is normal in structure. Tricuspid valve regurgitation is mild. Aortic Valve: The aortic valve is normal in structure. Aortic valve regurgitation is not visualized. Pulmonic Valve: The pulmonic valve was normal in structure. Pulmonic valve regurgitation is not visualized. Aorta: The aortic root and ascending aorta are structurally normal, with no evidence of dilitation. Venous: IVC not visualized.  LEFT VENTRICLE PLAX 2D LVIDd:         6.90 cm LVIDs:         6.50 cm LV PW:         1.00 cm LV IVS:        1.00 cm LVOT diam:     2.00 cm LV SV:         31 LV SV Index:   15 LVOT Area:     3.14 cm  RIGHT VENTRICLE RV S prime:     9.01 cm/s TAPSE (M-mode): 1.2 cm LEFT ATRIUM           Index        RIGHT ATRIUM           Index LA diam:      4.50 cm 2.21 cm/m   RA Area:      21.90 cm LA Vol (A2C): 74.1 ml 36.37 ml/m  RA Volume:   66.50 ml  32.64 ml/m  AORTIC VALVE LVOT Vmax:   82.70 cm/s LVOT Vmean:  50.700 cm/s LVOT VTI:    0.099 m  AORTA Ao Root diam: 2.90 cm Ao Asc diam:  3.20 cm TRICUSPID VALVE TR Peak grad:   19.7 mmHg TR Vmax:        222.00 cm/s  SHUNTS Systemic VTI:  0.10 m Systemic Diam: 2.00 cm Carolan Clines Electronically signed by Carolan Clines Signature Date/Time: 11/02/2021/10:38:19 AM    Final    DG Chest 1 View  Result Date: 11/01/2021 CLINICAL DATA:  Chest pain EXAM: CHEST  1 VIEW COMPARISON:  10/13/2021 FINDINGS: Unchanged enlarged cardiac silhouette. Normal mediastinal contours. Improved right basilar atelectasis. No new focal pulmonary opacity. No pleural effusion or pneumothorax. No acute osseous abnormality. IMPRESSION: Cardiomegaly with improved right basilar atelectasis. Electronically Signed   By: Wiliam Ke M.D.   On: 11/01/2021 16:04    Cardiac Studies  TTE 11/02/2021  1. Spontaneous contrast is visualized, challenging to definitively rule  out LV thrombus without contrast. Compared to prior study, no large  thrombus is seen. Left ventricular ejection fraction, by estimation, is  <20%. The left ventricle has severely  decreased function. The left ventricle demonstrates global hypokinesis.  The left ventricular internal cavity size was moderately dilated. Left  ventricular diastolic parameters are indeterminate.   2. Right ventricular systolic function is severely reduced. The right  ventricular size is mildly enlarged.   3. Left atrial size was moderately dilated.   4. Right atrial size was moderately dilated.   5. The mitral valve is normal in structure. Mild mitral valve  regurgitation.   6. The aortic valve is normal in structure. Aortic valve regurgitation is  not visualized.   7. IVC not visualized.   LHC/RHC 08/02/2021 Mild, non-obstructive coronary artery disease. Normal left and right heart filling  pressures. Moderately-severely reduced Fick cardiac output/index.  Patient Profile  61 year old male with history of nonischemic cardiomyopathy, LV thrombus, cocaine abuse, COPD, tobacco abuse who was admitted on 11/02/2021 with chest pain and elevated troponin concerning for non-STEMI.  Assessment & Plan   #Non-STEMI #Cocaine abuse -Suspect this is either vasospasm or possible thrombotic event in the setting of LV thrombus with poor compliance with medications. -No ST elevation. -Symptoms have resolved. -LV is severely reduced and severely dilated.  Really nothing can improve this from a coronary standpoint. -Continue heparin drip for 48 hours.  Transition back to Xarelto tomorrow. -Continue aspirin and statin. -Not a candidate for invasive angiography given ongoing substance abuse.  Again given no ST elevation I see no need for catheterization.  #Acute on chronic systolic heart failure, EF 10 to 15% #End-stage cardiomyopathy -Cardiac output was severely reduced in April.  His left ventricle is 7 cm.  LVOT VTI is severely reduced.  He is tachycardic. -He has a narrow pulse pressure.  He has all the signs of low output heart failure. -Not a candidate for beta-blocker given cocaine abuse as well as low output heart failure. -We will start digoxin 0.125 mg daily. -Started on losartan 25 mg daily.  Add Aldactone 12.5 mg daily. -Unclear benefit of SGLT2 inhibitor in the setting of NYHA class IV -He appears euvolemic.  Would transition to home torsemide 20 mg daily. -Not a candidate for advanced therapies.  Ongoing cocaine abuse.  Also with tobacco abuse.  Also with noncompliance.  I have encouraged him to become more compliant.  #LV thrombus -LV thrombus on echo from prior admission.  No longer there.  On heparin.  Plan to transition back to Xarelto tomorrow.  #Cocaine abuse -Cessation advised    For questions or updates, please contact [facility] Please consult [email or web address] for  contact info under   Signed, Gerri Spore T. Flora Lipps, MD, [facility]  [facility]  11/03/2021 10:05 AM

## 2021-11-03 NOTE — Assessment & Plan Note (Signed)
LV systolic function reduced, <20% with global hypokinesis. RV systolic function with sever reduction. No significant valvular disease. No evidence of LV thrombus.   Volume status has improved.  Plan to continue medical therapy with digoxin, spironolactone and losartan Diuresis with torsemide.

## 2021-11-03 NOTE — Assessment & Plan Note (Signed)
No signs of exacerbation  

## 2021-11-03 NOTE — Progress Notes (Signed)
Heart Failure Stewardship Pharmacist Progress Note   PCP: Pcp, No PCP-Cardiologist: Rollene Rotunda, MD    HPI:  61 yo M with PMH of CHF, LV thrombus, schizophrenia, COPD, alcohol use, and cocaine use.  History of HFrEF dating back to 2019. EF 25-30%. Cath at that time with normal coronaries. Repeat ECHOs with EF <20% to 30-35% in the setting on ongoing substance use and noncompliance. Cath again in April without major ischemia.   He presented to the ED on 7/24 with chest pain. Dypnea at baseline. Noted LEE. CXR with cardiomegaly and R basilar atelectasis. ECHO 7/25 with LVEF <20%, no LV thrombus, and severely reduced RV.  Current HF Medications: Diuretic: torsemide 20 mg daily ACE/ARB/ARNI: losartan 25 mg daily MRA: spironolactone 12.5 mg daily Other: digoxin 0.125 mg daily  Prior to admission HF Medications: Diuretic: torsemide 20 mg daily Other: hydralazine 25 mg TID + Imdur 15 mg daily  Pertinent Lab Values: Serum creatinine 0.95, BUN 17, Potassium 3.6, Sodium 133, BNP 1448.9, Magnesium 1.6 (7/25), Digoxin level due in ~1 week  Vital Signs: Weight: 172 lbs (admission weight: 173 lbs) Blood pressure: 120/8s  Heart rate: 80s  I/O: not well documented today  Medication Assistance / Insurance Benefits Check: Does the patient have prescription insurance?  Yes Type of insurance plan: [place] Medicaid  Outpatient Pharmacy:  Prior to admission outpatient pharmacy: [facility] Is the patient willing to use [facility] pharmacy at discharge? Yes Is the patient willing to transition their outpatient pharmacy to utilize a [facility] outpatient pharmacy?   Pending    Assessment: 1. Acute on chronic systolic CHF (LVEF <20%), due to NICM - substance abuse. NYHA class IV symptoms. - Agree with transitioning to torsemide 20 mg daily. Mag low yesterday, given 2g. Recheck mag level in AM. K 3.6 - added spironolactone and transitioned to PO diuretics. Will recheck BMET in AM and replace K as needed. -  No BB with low output severe biventricular HF. Carvedilol preferred with ongoing cocaine use when BB indicated. - Continue losartan 25 mg daily - Agree with starting spironolactone 12.5 mg daily - Agree with starting digoxin 0.125 mg daily. Check level in 1 week. - Consider adding SGLT2i at follow up   Plan: 1) Medication changes recommended at this time: - Agree with changes above - Digoxin level in 1 week - Magnesium level in AM  2) Patient assistance: - None needed - has [place] Medicaid  3)  Education  - To be completed prior to discharge  Sharen Hones, PharmD, BCPS Heart Failure Stewardship Pharmacist Phone [phone]

## 2021-11-03 NOTE — Assessment & Plan Note (Signed)
Patient with no chest pain, dyspnea and edema is improving.   Plan to continue medical therapy for acute coronary syndrome, likely cocaine induced.  Plan to transition to rivaroxaban in am.  Continue with aspirin, and losartan. No B blocker due to cocaine use.

## 2021-11-03 NOTE — Hospital Course (Signed)
Mr. Suder was admitted to the hospital with the working diagnosis of decompensated heart failure, in the setting of NSTEMI and cocaine intoxication.   61 yo male with the past medical history of systolic heart failure. LV thrombus and etho abuse who presented with chest pain. Reported lower extremity edema and dyspnea. He used cocaine 4 days prior to presentation. On his initial physical examination his blood pressure was 104/84. RR 18 HR 86 and 02 saturation 100%. Lungs with no wheezing or rhonchi, but positive rales, heart with S1 and S2 present and rhythmic, abdomen not distended and trace lower extremity edema.   Na 135, K 4,0 Cl 102 bicarbonate 24 glucose 102, bun 18 cr 0,92 High sensitive troponin 36, 79, 318, 823, 1,229, 2,311  Wbc 7,5 hgb 12,7 plt 187  Sars covid 19 negative  Urine analysis with SG 1,008 with 30 protein and 0-5 wbc.  Toxicology positive for cocaine.   Chest radiograph with cardiomegaly, mild hilar vascular congestion, no infiltrates.   EKG 89 bpm, left axis with left anterior fascicular block, sinus rhythm with q wave II, III, AvF, V1 and V2, with no significant ST segment changes, negative T V5 and V6. (Old changes).   Patient was placed on heparin for NSTEMI related to cocaine intoxication.   Received furosemide for diuresis. After 48 hrs of IV heparin patient was transitioned to rivaroxaban.  Echocardiogram with reduced LV systolic function.  Heart failure regimen was optimized.  Will need close follow up as outpatient.

## 2021-11-03 NOTE — Progress Notes (Signed)
PT Cancellation Note  Patient Details Name: Allen Mayer MRN: [id number] DOB: February 19, 1961   Cancelled Treatment:    Reason Eval/Treat Not Completed: Patient declined, no reason specified;Pain limiting ability to participate. PT attempted to evaluate pt however pt declines due to L knee discomfort. Pt reports he is not going to walk until he is walking out of the door to leave the hospital. PT attempts to provide encouragement however pt continues to decline. PT will attempt to follow up as time allows.   Arlyss Gandy 11/03/2021, 10:51 AM

## 2021-11-03 NOTE — Progress Notes (Addendum)
ANTICOAGULATION CONSULT NOTE - Follow Up   Pharmacy Consult for heparin Indication: chest pain/ACS and recent mural thrombus  No Known Allergies  Patient Measurements: Height: 6' (182.9 cm) Weight: 78 kg (172 lb) IBW/kg (Calculated) : 77.6  Vital Signs: Temp: 98.2 F (36.8 C) (07/26 0722) Temp Source: Oral (07/26 0722) BP: 121/80 (07/26 0722) Pulse Rate: 79 (07/26 0722)  Labs: Recent Labs    11/01/21 1527 11/01/21 1805 11/01/21 2330 11/02/21 0104 11/02/21 0315 11/02/21 1315 11/02/21 2043 11/03/21 0143  HGB 12.7*  --   --   --  13.7  --   --  13.5  HCT 38.9*  --   --   --  41.6  --   --  40.5  PLT 187  --   --   --  188  --   --  161  APTT  --   --   --   --   --  44* 66* 61*  HEPARINUNFRC  --   --   --   --   --  0.22*  --  0.32  CREATININE 0.92  --   --   --  1.09  --   --   --   CKTOTAL  --   --   --  214  --   --   --   --   TROPONINIHS 36*   < > 823* 1,229* 2,311*  --   --   --    < > = values in this interval not displayed.     Estimated Creatinine Clearance: 77.1 mL/min (by C-G formula based on SCr of 1.09 mg/dL).   Assessment: 61yo male on Xarelto for recent mural thrombus (Apr 2023) presents w/ CP, now CP free but troponin rising (36>79>318>823>1229>2311). Pharmacy consulted to start heparin.  Pt states his last Xarelto dose was "yesterday morning" but couldn't clarify 7/24 vs 7/23.  aPTT 61 sec, slightly below goal. Heparin level is 0.32 - just within goal.  No bleeding noted. RN expressed no issues or concerns at this time.   Goal of Therapy:  Heparin level 0.3-0.7 units/ml aPTT 66-102 seconds Monitor platelets by anticoagulation protocol: Yes   Plan:  Continue heparin to 1750 units/hr to keep in therapeutic range Will check a heparin level and aPTT level today at 1000 to confirm correlation  Check a daily heparin level starting tomorrow -  will likely continue with only daily heparin levels from this point forward   Thank you for allowing  pharmacy to be part of this patient's care.   Blane Ohara, PharmD  PGY1 Pharmacy Resident  _______________________________________________________________ I discussed / reviewed the pharmacy note by Dr. Belva Crome and I agree with the resident's findings and plans as documented. Greta Doom BS, PharmD, BCPS Clinical Pharmacist 11/03/2021 9:47 AM  Contact: [phone] after 3 PM  "Be curious, not judgmental..." -Debbora Dus _______________________________________________________________  Addendum: aPTT level discontinued, levels correlated appropriately.

## 2021-11-03 NOTE — Progress Notes (Signed)
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 Pt educated on the need without participation.   Mateo Flow 11/03/2021, 2:30 PM

## 2021-11-03 NOTE — Progress Notes (Signed)
Progress Note   Patient: Allen Mayer JEH:[id number] DOB: 03/29/1961 DOA: 11/01/2021     1 DOS: the patient was seen and examined on 11/03/2021   Brief hospital course: Mr. Allen Mayer was admitted to the hospital with the working diagnosis of decompensated heart failure  61 yo male with the past medical history of systolic heart failure. LV thrombus and etho abuse who presented with chest pain. Reported lower extremity edema and dyspnea. He used cocaine 4 days prior to presentation. On his initial physical examination his blood pressure was 104/84. RR 18 HR 86 and 02 saturation 100%. Lungs with no wheezing or rhonchi, but positive rales, heart with S1 and S2 present and rhythmic, abdomen not distended and trace lower extremity edema.   Na 135, K 4,0 Cl 102 bicarbonate 24 glucose 102, bun 18 cr 0,92 High sensitive troponin 36, 79, 318, 823, 1,229, 2,311  Wbc 7,5 hgb 12,7 plt 187  Sars covid 19 negative  Urine analysis with SG 1,008 with 30 protein and 0-5 wbc.  Toxicology positive for cocaine.   Chest radiograph with cardiomegaly, mild hilar vascular congestion, no infiltrates.   EKG 89 bpm, left axis with left anterior fascicular block, sinus rhythm with q wave II, III, AvF, V1 and V2 with no significant ST segment changes, negative T V5 and V6. (Old changes).   Patient was placed on heparin for NSTEMI related to cocaine intoxication.   Assessment and Plan: * NSTEMI (non-ST elevated myocardial infarction) [facility]) Patient with no chest pain, dyspnea and edema is improving.   Plan to continue medical therapy for acute coronary syndrome, likely cocaine induced.  Plan to transition to rivaroxaban in am.  Continue with aspirin, and losartan. No B blocker due to cocaine use.    Acute on chronic combined systolic and diastolic CHF (congestive heart failure) (HCC) LV systolic function reduced, <20% with global hypokinesis. RV systolic function with sever reduction. No significant valvular  disease. No evidence of LV thrombus.   Volume status has improved.  Plan to continue medical therapy with digoxin, spironolactone and losartan Diuresis with torsemide.   Cocaine abuse (HCC) Acute cocaine intoxication Continue supportive medical therapy.   COPD (chronic obstructive pulmonary disease) (HCC) No signs of exacerbation   Tobacco abuse Smoking cessation counseling         Subjective: Patient with no chest pain or dyspnea   Physical Exam: Vitals:   11/02/21 2247 11/03/21 0022 11/03/21 0555 11/03/21 0722  BP: 100/83 101/83 108/88 121/80  Pulse: 80 89 99 79  Resp: 20 20 20 20   Temp: 97.6 F (36.4 C) 97.6 F (36.4 C) 98.1 F (36.7 C) 98.2 F (36.8 C)  TempSrc: Oral Oral Oral Oral  SpO2: 98% 93% 93% 97%  Weight:  78 kg    Height:       Neurology awake and alert ENT with no pallor Cardiovascular with S1 and S2 present and rhythmic with no gallops or murmurs Respiratory with no rales Abdomen not distended No lower extremity edema  Right knee with palpable effusion, but not erythema, increase local temperature or significant tenderness  Data Reviewed:    Family Communication: I spoke with patient's wife at the bedside, we talked in detail about patient's condition, plan of care and prognosis and all questions were addressed.   Disposition: Status is: Inpatient Remains inpatient appropriate because: acute coronary syndrome on IV heparin   Planned Discharge Destination: Home    Author: , MD 11/03/2021 2:43 PM  For on call review  [email or web address]

## 2021-11-04 ENCOUNTER — Other Ambulatory Visit (HOSPITAL_COMMUNITY): Payer: Self-pay

## 2021-11-04 ENCOUNTER — Encounter (HOSPITAL_COMMUNITY): Payer: Self-pay | Admitting: Internal Medicine

## 2021-11-04 DIAGNOSIS — F1410 Cocaine abuse, uncomplicated: Secondary | ICD-10-CM | POA: Diagnosis not present

## 2021-11-04 DIAGNOSIS — J431 Panlobular emphysema: Secondary | ICD-10-CM | POA: Diagnosis not present

## 2021-11-04 DIAGNOSIS — I5043 Acute on chronic combined systolic (congestive) and diastolic (congestive) heart failure: Secondary | ICD-10-CM | POA: Diagnosis not present

## 2021-11-04 DIAGNOSIS — I214 Non-ST elevation (NSTEMI) myocardial infarction: Secondary | ICD-10-CM | POA: Diagnosis not present

## 2021-11-04 LAB — BASIC METABOLIC PANEL
Anion gap: 8 (ref 5–15)
BUN: 23 mg/dL (ref 8–23)
CO2: 25 mmol/L (ref 22–32)
Calcium: 8.7 mg/dL — ABNORMAL LOW (ref 8.9–10.3)
Chloride: 101 mmol/L (ref 98–111)
Creatinine, Ser: 1.1 mg/dL (ref 0.61–1.24)
GFR, Estimated: >60 mL/min (ref >60)
Glucose, Bld: 108 mg/dL — ABNORMAL HIGH (ref 70–99)
Potassium: 4.2 mmol/L (ref 3.5–5.1)
Sodium: 134 mmol/L — ABNORMAL LOW (ref 135–145)

## 2021-11-04 LAB — CBC
HCT: 41.1 % (ref 39.0–52.0)
Hemoglobin: 13.3 g/dL (ref 13.0–17.0)
MCH: 28.4 pg (ref 26.0–34.0)
MCHC: 32.4 g/dL (ref 30.0–36.0)
MCV: 87.6 fL (ref 80.0–100.0)
Platelets: 186 10*3/uL (ref 150–400)
RBC: 4.69 MIL/uL (ref 4.22–5.81)
RDW: 14.4 % (ref 11.5–15.5)
WBC: 5.4 10*3/uL (ref 4.0–10.5)
nRBC: 0 % (ref 0.0–0.2)

## 2021-11-04 LAB — GLUCOSE, CAPILLARY
Glucose-Capillary: 118 mg/dL — ABNORMAL HIGH (ref 70–99)
Glucose-Capillary: 125 mg/dL — ABNORMAL HIGH (ref 70–99)

## 2021-11-04 LAB — HEPARIN LEVEL (UNFRACTIONATED): Heparin Unfractionated: 0.38 IU/mL (ref 0.30–0.70)

## 2021-11-04 LAB — MAGNESIUM: Magnesium: 2 mg/dL (ref 1.7–2.4)

## 2021-11-04 MED ORDER — TORSEMIDE 20 MG PO TABS
20.0000 mg | ORAL_TABLET | Freq: Every day | ORAL | 0 refills | Status: DC
  Filled 2021-11-04: qty 30, 30d supply, fill #0

## 2021-11-04 MED ORDER — DIGOXIN 125 MCG PO TABS
0.1250 mg | ORAL_TABLET | Freq: Every day | ORAL | 0 refills | Status: DC
  Filled 2021-11-04: qty 30, 30d supply, fill #0

## 2021-11-04 MED ORDER — ALBUTEROL SULFATE HFA 108 (90 BASE) MCG/ACT IN AERS
1.0000 | INHALATION_SPRAY | Freq: Four times a day (QID) | RESPIRATORY_TRACT | 0 refills | Status: DC | PRN
  Filled 2021-11-04: qty 18, 14d supply, fill #0

## 2021-11-04 MED ORDER — LOSARTAN POTASSIUM 25 MG PO TABS
25.0000 mg | ORAL_TABLET | Freq: Every day | ORAL | 0 refills | Status: DC
  Filled 2021-11-04: qty 30, 30d supply, fill #0

## 2021-11-04 MED ORDER — RIVAROXABAN 20 MG PO TABS
20.0000 mg | ORAL_TABLET | Freq: Once | ORAL | Status: AC
Start: 2021-11-04 — End: 2021-11-04
  Administered 2021-11-04: 20 mg via ORAL
  Filled 2021-11-04: qty 1

## 2021-11-04 MED ORDER — DICLOFENAC SODIUM 1 % EX GEL
2.0000 g | Freq: Three times a day (TID) | CUTANEOUS | Status: DC | PRN
  Administered 2021-11-04: 2 g via TOPICAL
  Filled 2021-11-04: qty 100

## 2021-11-04 MED ORDER — RIVAROXABAN 20 MG PO TABS
20.0000 mg | ORAL_TABLET | Freq: Every day | ORAL | Status: DC
Start: 2021-11-05 — End: 2021-11-04

## 2021-11-04 MED ORDER — SPIRONOLACTONE 25 MG PO TABS
12.5000 mg | ORAL_TABLET | Freq: Every day | ORAL | 0 refills | Status: DC
Start: 2021-11-05 — End: 2021-12-20
  Filled 2021-11-04: qty 15, 30d supply, fill #0

## 2021-11-04 NOTE — Progress Notes (Signed)
Heart Failure Nurse Navigator Progress Note  PCP: Pcp, No PCP-Cardiologist: Hochrein Admission Diagnosis: Chest pain, Elevated troponin.  Admitted from: Shelter via EMS  Presentation:   Allen Mayer presented with chest pain in center of chest, cough, BP 100/80, HR 66, Troponin 36-79, BNP 1,448, CXR showed Cardiomegaly with improved right basilar atelectasis. IV lasix, Ativan po and aspirin given. Patient has a ongoing substance use and compliance. He currently lives in a shelter, however he has a fiance whom lives with her mom, however he isn't allowed to stay there. He stated he is working on finding a apartment soon.   Patient was educated on the sign and symptoms of heart failure, daily weights 9 he states he has a scale at the shelter that he could use, Diet/ fluid restrictions, states he eats out  and doesn't always pay attention to his salt and drinks some soda. Spoke about taking his medications as prescribed m he stated he has someone who helps him with his medications, we spoke about his substance use and possible a cessation program for that, he stated he will think about it, he continues to smoke, "but not like he used too". Education was done on medication compliance and attending his medical appointments. He stated that he gets to his appointments by either his fiance or the bus. A hospital HF TOC appointment is scheduled for 11/22/2021 @ 11 am.   ECHO/ LVEF: <20%   Clinical Course:  Past Medical History:  Diagnosis Date   Acid reflux    Alcohol abuse    Arthritis    Asthma    Back pain    Bronchitis    Chest pain 07/31/2021   CHF (congestive heart failure) (HCC)    CKD (chronic kidney disease)    CKD (chronic kidney disease), stage III (HCC)    Cocaine abuse (HCC)    COPD (chronic obstructive pulmonary disease) (HCC)    History of noncompliance with medical treatment, presenting hazards to health    Homelessness    Hypertension    LV (left ventricular) mural thrombus     Myocardial infarction [facility])    Neuropathic pain    NICM (nonischemic cardiomyopathy) (HCC) 2019   NSTEMI (non-ST elevated myocardial infarction) (HCC) 07/31/2021   NSVT (nonsustained ventricular tachycardia) (HCC)    Pericardial effusion    Pulmonary hypertension (HCC)    RVF (right ventricular failure) (HCC)    Schizo affective schizophrenia (HCC)      Social History   Socioeconomic History   Marital status: Significant Other    Spouse name: Not on file   Number of children: 4   Years of education: Not on file   Highest education level: High school graduate  Occupational History   Occupation: disability  Tobacco Use   Smoking status: Every Day    Packs/day: 0.50    Types: Cigarettes   Smokeless tobacco: Never  Vaping Use   Vaping Use: Never used  Substance and Sexual Activity   Alcohol use: Yes    Comment: occasionally   Drug use: Yes    Types: Marijuana, Cocaine    Comment: LAst time 5 days ago   Sexual activity: Not on file  Other Topics Concern   Not on file  Social History Narrative   ** Merged History Encounter **       Lives with fiance.     Social Determinants of Health   Financial Resource Strain: Not on file  Food Insecurity: No Food Insecurity (11/04/2021)  Hunger Vital Sign    Worried About Running Out of Food in the Last Year: Never true    Ran Out of Food in the Last Year: Never true  Transportation Needs: No Transportation Needs (11/04/2021)   PRAPARE - Administrator, Civil Service (Medical): No    Lack of Transportation (Non-Medical): No  Physical Activity: Not on file  Stress: Not on file  Social Connections: Not on file   Education Assessment and Provision:  Detailed education and instructions provided on heart failure disease management including the following:  Signs and symptoms of Heart Failure When to call the physician Importance of daily weights Low sodium diet Fluid restriction Medication management Anticipated  future follow-up appointments  Patient education given on each of the above topics.  Patient acknowledges understanding via teach back method and acceptance of all instructions.  Education Materials:  "Living Better With Heart Failure" Booklet, HF zone tool, & Daily Weight Tracker Tool.  Patient has scale at home: yes, at shelter Patient has pill box at home: No, CSM to bring one to bedside    High Risk Criteria for Readmission and/or Poor Patient Outcomes: Heart failure hospital admissions (last 6 months): 0  No Show rate: 18% Difficult social situation: Yes, lives in a shelter currently.  Demonstrates medication adherence: No Primary Language: English Literacy level: Reading, writing, and comprehension.  Barriers of Care:   Diet/ fluid restrictions Daily weights/ poly substance Hx Lives in shelter currently   Considerations/Referrals:   Referral made to Heart Failure Pharmacist Stewardship: Yes Referral made to Heart Failure CSW/NCM TOC: Yes Referral made to Heart & Vascular TOC clinic: Yes, 11/22/2021 @ 11 am.   Items for Follow-up on DC/TOC: CSW/Pharm: med costs, lives in shelter currently Diet/ fluid restrictions Daily weights   Rhae Hammock, BSN, RN Heart Failure Teacher, adult education Only

## 2021-11-04 NOTE — Discharge Summary (Addendum)
Physician Discharge Summary   Patient: Allen Mayer MRN: [id number] DOB: 1960/09/01  Admit date:     11/01/2021  Discharge date: 11/04/21  Discharge Physician: York Ram Kirke Breach   PCP: Pcp, No   Recommendations at discharge:    Heart failure regimen was optimized, with digoxin, spironolactone and losartan. Diuresis with torsemide He has been advised to avoid substance abuse Follow up as outpatient. Currently not candidate for advance therapies due to non compliance and polysubstance abuse.   Discharge Diagnoses: Principal Problem:   NSTEMI (non-ST elevated myocardial infarction) (HCC) Active Problems:   Acute on chronic combined systolic and diastolic CHF (congestive heart failure) (HCC)   Cocaine abuse (HCC)   COPD (chronic obstructive pulmonary disease) (HCC)   Tobacco abuse  Resolved Problems:   * No resolved hospital problems. [facility] Course: Mr. Escoe was admitted to the hospital with the working diagnosis of decompensated heart failure, in the setting of NSTEMI and cocaine intoxication.   61 yo male with the past medical history of systolic heart failure. LV thrombus and etho abuse who presented with chest pain. Reported lower extremity edema and dyspnea. He used cocaine 4 days prior to presentation. On his initial physical examination his blood pressure was 104/84. RR 18 HR 86 and 02 saturation 100%. Lungs with no wheezing or rhonchi, but positive rales, heart with S1 and S2 present and rhythmic, abdomen not distended and trace lower extremity edema.   Na 135, K 4,0 Cl 102 bicarbonate 24 glucose 102, bun 18 cr 0,92 High sensitive troponin 36, 79, 318, 823, 1,229, 2,311  Wbc 7,5 hgb 12,7 plt 187  Sars covid 19 negative  Urine analysis with SG 1,008 with 30 protein and 0-5 wbc.  Toxicology positive for cocaine.   Chest radiograph with cardiomegaly, mild hilar vascular congestion, no infiltrates.   EKG 89 bpm, left axis with left anterior fascicular  block, sinus rhythm with q wave II, III, AvF, V1 and V2, with no significant ST segment changes, negative T V5 and V6. (Old changes).   Patient was placed on heparin for NSTEMI related to cocaine intoxication.   Received furosemide for diuresis. After 48 hrs of IV heparin patient was transitioned to rivaroxaban.  Echocardiogram with reduced LV systolic function.  Heart failure regimen was optimized.  Will need close follow up as outpatient.   Assessment and Plan: * NSTEMI (non-ST elevated myocardial infarction) [facility]) Patient with no chest pain, dyspnea and edema is improving.   Plan to continue medical therapy for acute coronary syndrome, likely cocaine induced.  Plan to transition to rivaroxaban in am.  Continue with aspirin, and losartan. No B blocker due to cocaine use.    Acute on chronic combined systolic and diastolic CHF (congestive heart failure) (HCC) LV systolic function reduced, <20% with global hypokinesis. RV systolic function with sever reduction. No significant valvular disease. No evidence of LV thrombus.   Volume status has improved.  Plan to continue medical therapy with digoxin, spironolactone and losartan Diuresis with torsemide.   Cocaine abuse (HCC) Acute cocaine intoxication Continue supportive medical therapy.   COPD (chronic obstructive pulmonary disease) (HCC) No signs of exacerbation   Tobacco abuse Smoking cessation counseling          Consultants: cardiology  Procedures performed: none   Disposition: Home Diet recommendation:  Cardiac diet DISCHARGE MEDICATION: Allergies as of 11/04/2021   No Known Allergies      Medication List     STOP taking these medications    carvedilol  6.25 MG tablet Commonly known as: COREG   citalopram 20 MG tablet Commonly known as: CELEXA   hydrALAZINE 25 MG tablet Commonly known as: APRESOLINE   isosorbide mononitrate 30 MG 24 hr tablet Commonly known as: IMDUR   oxyCODONE-acetaminophen  5-325 MG tablet Commonly known as: Percocet       TAKE these medications    acetaminophen 500 MG tablet Commonly known as: TYLENOL Take 1 tablet (500 mg total) by mouth every 6 (six) hours as needed.   albuterol 108 (90 Base) MCG/ACT inhaler Commonly known as: VENTOLIN HFA Inhale 1-2 puffs into the lungs every 6 (six) hours as needed for wheezing or shortness of breath. What changed: when to take this   Aspirin Low Dose 81 MG tablet Generic drug: aspirin EC Take 1 tablet (81 mg total) by mouth daily. Swallow whole.   atorvastatin 80 MG tablet Commonly known as: LIPITOR Take 1 tablet (80 mg total) by mouth daily.   cyclobenzaprine 10 MG tablet Commonly known as: FLEXERIL Take 1 tablet (10 mg total) by mouth 2 (two) times daily as needed for muscle spasms.   digoxin 0.125 MG tablet Commonly known as: LANOXIN Take 1 tablet (0.125 mg total) by mouth daily. Start taking on: November 05, 2021   Flovent HFA 110 MCG/ACT inhaler Generic drug: fluticasone Inhale 2 puffs into the lungs 2 (two) times daily.   gabapentin 400 MG capsule Commonly known as: NEURONTIN Take 1 capsule (400 mg total) by mouth daily as needed (nerve pain).   losartan 25 MG tablet Commonly known as: COZAAR Take 1 tablet (25 mg total) by mouth daily. Start taking on: November 05, 2021   nitroGLYCERIN 0.4 MG SL tablet Commonly known as: NITROSTAT Place 1 tablet (0.4 mg total) under the tongue every 5 (five) minutes as needed for chest pain.   spironolactone 25 MG tablet Commonly known as: ALDACTONE Take 0.5 tablets (12.5 mg total) by mouth daily. Start taking on: November 05, 2021   torsemide 20 MG tablet Commonly known as: DEMADEX Take 1 tablet (20 mg total) by mouth daily. Start taking on: November 05, 2021 What changed: additional instructions   Xarelto 20 MG Tabs tablet Generic drug: rivaroxaban Take 1 tablet (20 mg total) by mouth daily with supper.               Durable Medical Equipment   (From admission, onward)           Start     Ordered   11/04/21 1359  For home use only DME 4 wheeled rolling walker with seat  Once       Question:  Patient needs a walker to treat with the following condition  Answer:  Heart failure (HCC)   11/04/21 1359            Follow-up Information     [facility] Follow up.   Why: go here to get your medications Contact information: [address] [place] [address] [phone]        [facility] Follow up on 11/23/2021.   Why: 9:30 for hospital follow up Contact information: [address] [place] [address] [phone]        Rollene Rotunda, MD Follow up.   Specialty: Cardiology Why: Hospital follow-up with General Cardiology scheduled for 12/06/2021 at 11:00am. Please arrive 15 minutes early for check-in. If this date/time does not work for you, please call our  office to reschedule. Contact information: [address] [place] [address] [phone]         [facility]. Go in 18 day(s).   Specialty: Cardiology Why: Hospital Follow up PLEASE bring current medication list to appointment FREE valet parking, Entrance C, off [address] information: [address] [id number] mc [place] [address] [phone]               Discharge Exam: Ceasar Mons Weights   11/02/21 1254 11/03/21 0022 11/04/21 0154  Weight: 78.8 kg 78 kg 77.5 kg   BP 94/62 (BP Location: Right Arm)   Pulse 77   Temp 97.8 F (36.6 C) (Oral)   Resp 20   Ht 6' (1.829 m)   Wt 77.5 kg Comment: scale b  SpO2 100%   BMI 23.18 kg/m   Patient with no chest pain or dyspnea, no lower extremity edema  Neurology awake and alert ENT with no pallor Cardiovascular with S1 and S2 present and rhythmic with no gallops, rubs or  murmurs No JVD No lower extremity edema Right knee with local edema but not tenderness, erythema or increased local temperature.  Abdomen not distended   Condition at discharge: stable  The results of significant diagnostics from this hospitalization (including imaging, microbiology, ancillary and laboratory) are listed below for reference.   Imaging Studies: DG Knee 1-2 Views Left  Result Date: 11/02/2021 CLINICAL DATA:  Left knee pain, swelling and fluid for 1 year. EXAM: LEFT KNEE - 1-2 VIEW COMPARISON:  Left knee radiographs 10/07/2021 FINDINGS: Moderate medial compartment joint space narrowing and peripheral osteophytosis. Moderate to large joint effusion is similar to prior. There are 2 chronic ossicles again at the superior aspect of the tibial tubercle, likely the sequela of remote Osgood-Schlatter disease. IMPRESSION: Moderate medial compartment osteoarthritis. Moderate to large joint effusion. Unchanged ossicles at the superior aspect of the tibial tubercle consistent with chronic Osgood-Schlatter disease. Electronically Signed   By: Neita Garnet M.D.   On: 11/02/2021 14:15   ECHOCARDIOGRAM LIMITED  Result Date: 11/02/2021    ECHOCARDIOGRAM LIMITED REPORT   Patient Name:   LUCIA MCCREADIE Date of Exam: 11/02/2021 Medical Rec #:  [id number]        Height:       72.0 in Accession #:    [id number]       Weight:       180.0 lb Date of Birth:  1960-08-27       BSA:          2.037 m Patient Age:    60 years         BP:           137/105 mmHg Patient Gender: M                HR:           94 bpm. Exam Location:  Inpatient Procedure: Limited Echo and Intracardiac Opacification Agent Indications:     Left ventricle Mural Thrombus  History:         Patient has prior history of Echocardiogram examinations, most                  recent 11/02/2021. CHF, COPD and Pulmonary HTN; Risk                  Factors:Hypertension and Current Smoker. Polysubstance Abuse.  Sonographer:     Delcie Roch RDCS  Referring Phys:  [id number] DAYNA N DUNN Diagnosing Phys: [name] IMPRESSIONS  1. LVEF severely reduced EF < 20%. With contrast, no evidence of LV thrombus. FINDINGS  Left Ventricle: LVEF severely reduced EF < 20%. With contrast, no evidence of LV thrombus. IVC IVC diam: 1.60 cm Carolan Clines Electronically signed by Carolan Clines Signature Date/Time: 11/02/2021/12:21:53 PM    Final (Updated)    ECHOCARDIOGRAM COMPLETE  Result Date: 11/02/2021    ECHOCARDIOGRAM REPORT   Patient Name:   ELEASAR KIEL Date of Exam: 11/02/2021 Medical Rec #:  [id number]        Height:       72.0 in Accession #:    [id number]       Weight:       180.0 lb Date of Birth:  December 27, 1960       BSA:          2.037 m Patient Age:    60 years         BP:           137/105 mmHg Patient Gender: M                HR:           93 bpm. Exam Location:  Inpatient Procedure: 2D Echo Indications:    acute systolic chf  History:        Patient has prior history of Echocardiogram examinations, most                 recent 08/01/2021. Cardiomyopathy, COPD, Signs/Symptoms:Chest                 Pain; Risk Factors:Current Smoker and cocaine use.  Sonographer:    Delcie Roch RDCS Referring Phys: [id number] ANASTASSIA DOUTOVA  Sonographer Comments: Image acquisition challenging due to uncooperative patient. IMPRESSIONS  1. Spontaneous contrast is visualized, challenging to definitively rule out LV thrombus without contrast. Compared to prior study, no large thrombus is seen. Left ventricular ejection fraction, by estimation, is <20%. The left ventricle has severely decreased function. The left ventricle demonstrates global hypokinesis. The left ventricular internal cavity size was moderately dilated. Left ventricular diastolic parameters are indeterminate.  2. Right ventricular systolic function is severely reduced. The right ventricular size is mildly enlarged.  3. Left atrial size was moderately dilated.  4. Right atrial size was moderately dilated.  5. The mitral  valve is normal in structure. Mild mitral valve regurgitation.  6. The aortic valve is normal in structure. Aortic valve regurgitation is not visualized.  7. IVC not visualized. Conclusion(s)/Recommendation(s): Large LV thrombus has resolved compared to prior study. FINDINGS  Left Ventricle: Spontaneous contrast is visualized, challenging to definitively rule out LV thrombus without contrast. Compared to prior study, no large thrombus is seen. Left ventricular ejection fraction, by estimation, is <20%. The left ventricle has  severely decreased function. The left ventricle demonstrates global hypokinesis. The left ventricular internal cavity size was moderately dilated. There is no left ventricular hypertrophy. Left ventricular diastolic parameters are indeterminate. Right Ventricle: The right ventricular size is mildly enlarged. Right ventricular systolic function is severely reduced. Left Atrium: Left atrial size was moderately dilated. Right Atrium: Right atrial size was moderately dilated. Pericardium: There is no evidence of pericardial effusion. Mitral Valve: The mitral valve is normal in structure. Mild mitral valve regurgitation. Tricuspid Valve: The tricuspid valve is normal in structure. Tricuspid valve regurgitation is mild. Aortic Valve: The aortic valve is normal in structure. Aortic valve regurgitation is not visualized. Pulmonic Valve:  The pulmonic valve was normal in structure. Pulmonic valve regurgitation is not visualized. Aorta: The aortic root and ascending aorta are structurally normal, with no evidence of dilitation. Venous: IVC not visualized.  LEFT VENTRICLE PLAX 2D LVIDd:         6.90 cm LVIDs:         6.50 cm LV PW:         1.00 cm LV IVS:        1.00 cm LVOT diam:     2.00 cm LV SV:         31 LV SV Index:   15 LVOT Area:     3.14 cm  RIGHT VENTRICLE RV S prime:     9.01 cm/s TAPSE (M-mode): 1.2 cm LEFT ATRIUM           Index        RIGHT ATRIUM           Index LA diam:      4.50 cm 2.21  cm/m   RA Area:     21.90 cm LA Vol (A2C): 74.1 ml 36.37 ml/m  RA Volume:   66.50 ml  32.64 ml/m  AORTIC VALVE LVOT Vmax:   82.70 cm/s LVOT Vmean:  50.700 cm/s LVOT VTI:    0.099 m  AORTA Ao Root diam: 2.90 cm Ao Asc diam:  3.20 cm TRICUSPID VALVE TR Peak grad:   19.7 mmHg TR Vmax:        222.00 cm/s  SHUNTS Systemic VTI:  0.10 m Systemic Diam: 2.00 cm Carolan Clines Electronically signed by Carolan Clines Signature Date/Time: 11/02/2021/10:38:19 AM    Final    DG Chest 1 View  Result Date: 11/01/2021 CLINICAL DATA:  Chest pain EXAM: CHEST  1 VIEW COMPARISON:  10/13/2021 FINDINGS: Unchanged enlarged cardiac silhouette. Normal mediastinal contours. Improved right basilar atelectasis. No new focal pulmonary opacity. No pleural effusion or pneumothorax. No acute osseous abnormality. IMPRESSION: Cardiomegaly with improved right basilar atelectasis. Electronically Signed   By: Wiliam Ke M.D.   On: 11/01/2021 16:04   DG Chest 2 View  Result Date: 10/13/2021 CLINICAL DATA:  Chest pain, leg swelling. EXAM: CHEST - 2 VIEW COMPARISON:  10/07/2021 FINDINGS: The heart is enlarged the mediastinal contour stable. The pulmonary vasculature is within normal limits. Lung volumes are low mild atelectasis is present at the right lung base. No effusion or pneumothorax. Degenerative changes are present in the thoracic spine. IMPRESSION: 1. Cardiomegaly. 2. Mild atelectasis at the right lung base. Electronically Signed   By: Thornell Sartorius M.D.   On: 10/13/2021 04:32   DG Chest Portable 1 View  Result Date: 10/07/2021 CLINICAL DATA:  61 year old male with lower extremity swelling. EXAM: PORTABLE CHEST 1 VIEW COMPARISON:  Chest radiographs 08/13/2021 and earlier. FINDINGS: Portable AP semi upright view at 0541 hours. Moderate cardiomegaly appears stable to mildly improved from last month, possibly due to regressed pericardial effusion. Other mediastinal contours are within normal limits. Visualized tracheal air column is within  normal limits. Allowing for portable technique the lungs are clear. No pneumothorax, pulmonary edema, or pleural effusion. No acute osseous abnormality identified. Negative visible bowel gas. IMPRESSION: Cardiomegaly appears stable to improved from last month. No acute cardiopulmonary abnormality. Electronically Signed   By: Odessa Fleming M.D.   On: 10/07/2021 05:52   DG Knee Complete 4 Views Right  Result Date: 10/07/2021 CLINICAL DATA:  Swelling and pain in both knees. EXAM: RIGHT KNEE - COMPLETE 4+ VIEW; LEFT KNEE -  COMPLETE 4+ VIEW COMPARISON:  Right knee series 10/14/2020. No prior left knee for comparison. FINDINGS: Right knee: A moderate-to-large suprapatellar bursal effusion has developed. There is mild tricompartmental joint space narrowing and small tricompartmental marginal osteophytes. No loose body or radiopaque foreign body is seen. There is artifact from overlying clothing. There is no fracture or destructive suspicious bone lesion. There are atherosclerotic calcifications. Only interval change is development of the suprapatellar fluid. Left knee: There is a large suprapatellar bursal effusion. No acute fracture is evident. The bone mineralization is normal. There is a cluster of dystrophic ossific bodies alongside the anterior tibial tubercle which could be due to remote trauma or childhood Osgood-Schlatter disease. There is mild to moderate tricompartmental joint space loss and mild tricompartmental marginal osteophytosis. No erosive arthropathy is seen. No loose body is evident. There is no radiopaque foreign body. There is artifact from overlying clothing. IMPRESSION: 1. Bilateral suprapatellar bursal effusions larger on the left. 2. Degenerative changes without evidence of fractures with joint space loss greater on the left. 3. Clustered ossicles about the left anterior tibial tubercle consistent with remote trauma or Osgood-Schlatter disease in childhood. Electronically Signed   By: Almira Bar  M.D.   On: 10/07/2021 03:24   DG Knee Complete 4 Views Left  Result Date: 10/07/2021 CLINICAL DATA:  Swelling and pain in both knees. EXAM: RIGHT KNEE - COMPLETE 4+ VIEW; LEFT KNEE - COMPLETE 4+ VIEW COMPARISON:  Right knee series 10/14/2020. No prior left knee for comparison. FINDINGS: Right knee: A moderate-to-large suprapatellar bursal effusion has developed. There is mild tricompartmental joint space narrowing and small tricompartmental marginal osteophytes. No loose body or radiopaque foreign body is seen. There is artifact from overlying clothing. There is no fracture or destructive suspicious bone lesion. There are atherosclerotic calcifications. Only interval change is development of the suprapatellar fluid. Left knee: There is a large suprapatellar bursal effusion. No acute fracture is evident. The bone mineralization is normal. There is a cluster of dystrophic ossific bodies alongside the anterior tibial tubercle which could be due to remote trauma or childhood Osgood-Schlatter disease. There is mild to moderate tricompartmental joint space loss and mild tricompartmental marginal osteophytosis. No erosive arthropathy is seen. No loose body is evident. There is no radiopaque foreign body. There is artifact from overlying clothing. IMPRESSION: 1. Bilateral suprapatellar bursal effusions larger on the left. 2. Degenerative changes without evidence of fractures with joint space loss greater on the left. 3. Clustered ossicles about the left anterior tibial tubercle consistent with remote trauma or Osgood-Schlatter disease in childhood. Electronically Signed   By: Almira Bar M.D.   On: 10/07/2021 03:24    Microbiology: Results for orders placed or performed during the hospital encounter of 11/01/21  SARS Coronavirus 2 by RT PCR (hospital order, performed in [facility] hospital lab) *cepheid single result test* Anterior Nasal Swab     Status: None   Collection Time: 11/02/21 12:24 AM   Specimen:  Anterior Nasal Swab  Result Value Ref Range Status   SARS Coronavirus 2 by RT PCR NEGATIVE NEGATIVE Final    Comment: (NOTE) SARS-CoV-2 target nucleic acids are NOT DETECTED.  The SARS-CoV-2 RNA is generally detectable in upper and lower respiratory specimens during the acute phase of infection. The lowest concentration of SARS-CoV-2 viral copies this assay can detect is 250 copies / mL. A negative result does not preclude SARS-CoV-2 infection and should not be used as the sole basis for treatment or other patient management decisions.  A  negative result may occur with improper specimen collection / handling, submission of specimen other than nasopharyngeal swab, presence of viral mutation(s) within the areas targeted by this assay, and inadequate number of viral copies (<250 copies / mL). A negative result must be combined with clinical observations, patient history, and epidemiological information.  Fact Sheet for Patients:   RoadLapTop.co.za  Fact Sheet for Healthcare Providers: http://kim-miller.com/  This test is not yet approved or  cleared by the [place] FDA and has been authorized for detection and/or diagnosis of SARS-CoV-2 by FDA under an Emergency Use Authorization (EUA).  This EUA will remain in effect (meaning this test can be used) for the duration of the COVID-19 declaration under Section 564(b)(1) of the Act, 21 U.S.C. section 360bbb-3(b)(1), unless the authorization is terminated or revoked sooner.  Performed at [facility], [address], [place], [place] [address]     Labs: CBC: Recent Labs  Lab 11/01/21 1527 11/02/21 0315 11/03/21 0143 11/04/21 0149  WBC 7.5 6.4 5.9 5.4  NEUTROABS 5.1  --   --   --   HGB 12.7* 13.7 13.5 13.3  HCT 38.9* 41.6 40.5 41.1  MCV 87.4 87.6 86.5 87.6  PLT 187 188 161 186   Basic Metabolic Panel: Recent Labs  Lab 11/01/21 1527 11/02/21 0104 11/02/21 0315  11/03/21 1008 11/04/21 0149  NA 135  --  138 133* 134*  K 4.0  --  3.7 3.6 4.2  CL 102  --  101 101 101  CO2 24  --  29 24 25   GLUCOSE 102*  --  105* 130* 108*  BUN 18  --  15 17 23   CREATININE 0.92  --  1.09 0.95 1.10  CALCIUM 8.5*  --  9.0 8.7* 8.7*  MG  --  1.6*  --   --  2.0  PHOS  --  3.0  --   --   --    Liver Function Tests: Recent Labs  Lab 11/01/21 1527 11/02/21 0104 11/02/21 0315  AST 26 34 41  ALT 35 31 32  ALKPHOS 82 73 80  BILITOT 1.2 1.5* 1.7*  PROT 6.9 6.7 7.4  ALBUMIN 3.1* 2.7* 3.0*   CBG: Recent Labs  Lab 11/04/21 0049 11/04/21 0624  GLUCAP 118* 125*    Discharge time spent: greater than 30 minutes.  Signed: 11/06/21, MD [facility] 11/04/2021

## 2021-11-04 NOTE — Progress Notes (Addendum)
Heart Failure Stewardship Pharmacist Progress Note   PCP: Pcp, No PCP-Cardiologist: Rollene Rotunda, MD    HPI:  61 yo M with PMH of CHF, LV thrombus, schizophrenia, COPD, alcohol use, and cocaine use.  History of HFrEF dating back to 2019. EF 25-30%. Cath at that time with normal coronaries. Repeat ECHOs with EF <20% to 30-35% in the setting on ongoing substance use and noncompliance. Cath again in April without major ischemia.   He presented to the ED on 7/24 with chest pain. Dypnea at baseline. Noted LEE. CXR with cardiomegaly and R basilar atelectasis. ECHO 7/25 with LVEF <20%, no LV thrombus, and severely reduced RV.  Current HF Medications: Diuretic: torsemide 20 mg daily ACE/ARB/ARNI: losartan 25 mg daily MRA: spironolactone 12.5 mg daily Other: digoxin 0.125 mg daily  Prior to admission HF Medications: Diuretic: torsemide 20 mg daily Other: hydralazine 25 mg TID + Imdur 15 mg daily  Pertinent Lab Values: Serum creatinine 1.10, BUN 23, Potassium 4.2, Sodium 134, BNP 1448.9, Magnesium 2.0, Digoxin level due in ~1 week  Vital Signs: Weight: 170 lbs (admission weight: 173 lbs) Blood pressure: 120/80s  Heart rate: 70-80s  I/O: not well documented today  Medication Assistance / Insurance Benefits Check: Does the patient have prescription insurance?  Yes Type of insurance plan: [place] Medicaid  Outpatient Pharmacy:  Prior to admission outpatient pharmacy: [facility] Is the patient willing to use [facility] TOC pharmacy at discharge? Yes Is the patient willing to transition their outpatient pharmacy to utilize a [facility] outpatient pharmacy?   Pending    Assessment: 1. Acute on chronic systolic CHF (LVEF <20%), due to NICM - substance abuse. NYHA class IV symptoms. - Continue torsemide 20 mg daily. Keep K>4 and Mag>2 - No BB with low output severe biventricular HF. Carvedilol preferred with ongoing cocaine use when BB indicated. - Continue losartan 25 mg daily - Continue  spironolactone 12.5 mg daily - Continue digoxin 0.125 mg daily. Check level in 1 week. - Consider adding SGLT2i at follow up   Plan: 1) Medication changes recommended at this time: - Digoxin level in 1 week  2) Patient assistance: - None needed - has  Medicaid  3)  Education  - Patient has been educated on current HF medications and potential additions to HF medication regimen - Patient verbalizes understanding that over the next few months, these medication doses may change and more medications may be added to optimize HF regimen - Patient has been educated on basic disease state pathophysiology and goals of therapy   Sharen Hones, PharmD, BCPS Heart Failure Stewardship Pharmacist Phone [phone]

## 2021-11-04 NOTE — Discharge Instructions (Signed)
Recommendations at discharge:     Heart failure regimen was optimized, with digoxin, spironolactone and losartan. Diuresis with torsemide He has been advised to avoid substance abuse Follow up as outpatient. Currently not candidate for advance therapies due to non compliance and polysubstance abuse.

## 2021-11-04 NOTE — Progress Notes (Signed)
Nsg Discharge Note  Admit Date:  11/01/2021 Discharge date: 11/04/2021   Allen Mayer to be D/C'd Home per MD order.  AVS completed.  Copy for chart, and copy for patient signed, and dated. Patient/caregiver able to verbalize understanding.  Discharge Medication: Allergies as of 11/04/2021   No Known Allergies      Medication List     STOP taking these medications    carvedilol 6.25 MG tablet Commonly known as: COREG   citalopram 20 MG tablet Commonly known as: CELEXA   hydrALAZINE 25 MG tablet Commonly known as: APRESOLINE   isosorbide mononitrate 30 MG 24 hr tablet Commonly known as: IMDUR   oxyCODONE-acetaminophen 5-325 MG tablet Commonly known as: Percocet       TAKE these medications    acetaminophen 500 MG tablet Commonly known as: TYLENOL Take 1 tablet (500 mg total) by mouth every 6 (six) hours as needed.   Aspirin Low Dose 81 MG tablet Generic drug: aspirin EC Take 1 tablet (81 mg total) by mouth daily. Swallow whole.   atorvastatin 80 MG tablet Commonly known as: LIPITOR Take 1 tablet (80 mg total) by mouth daily.   cyclobenzaprine 10 MG tablet Commonly known as: FLEXERIL Take 1 tablet (10 mg total) by mouth 2 (two) times daily as needed for muscle spasms.   digoxin 0.125 MG tablet Commonly known as: LANOXIN Take 1 tablet (0.125 mg total) by mouth daily. Start taking on: November 05, 2021   Flovent HFA 110 MCG/ACT inhaler Generic drug: fluticasone Inhale 2 puffs into the lungs 2 (two) times daily.   gabapentin 400 MG capsule Commonly known as: NEURONTIN Take 1 capsule (400 mg total) by mouth daily as needed (nerve pain).   losartan 25 MG tablet Commonly known as: COZAAR Take 1 tablet (25 mg total) by mouth daily. Start taking on: November 05, 2021   nitroGLYCERIN 0.4 MG SL tablet Commonly known as: NITROSTAT Place 1 tablet (0.4 mg total) under the tongue every 5 (five) minutes as needed for chest pain.   spironolactone 25 MG  tablet Commonly known as: ALDACTONE Take 0.5 tablets (12.5 mg total) by mouth daily. Start taking on: November 05, 2021   torsemide 20 MG tablet Commonly known as: DEMADEX Take 1 tablet (20 mg total) by mouth daily. Start taking on: November 05, 2021 What changed: additional instructions   Ventolin HFA 108 (90 Base) MCG/ACT inhaler Generic drug: albuterol Inhale 1-2 puffs into the lungs every 6 (six) hours as needed for wheezing or shortness of breath. What changed: when to take this   Xarelto 20 MG Tabs tablet Generic drug: rivaroxaban Take 1 tablet (20 mg total) by mouth daily with supper.               Durable Medical Equipment  (From admission, onward)           Start     Ordered   11/04/21 1359  For home use only DME 4 wheeled rolling walker with seat  Once       Question:  Patient needs a walker to treat with the following condition  Answer:  Heart failure (HCC)   11/04/21 1359            Discharge Assessment: Vitals:   11/04/21 0718 11/04/21 1207  BP: 111/86 94/62  Pulse: 62 77  Resp: 18 20  Temp: 97.8 F (36.6 C)   SpO2: 93% 100%   Skin clean, dry and intact without evidence of skin break down, no evidence  of skin tears noted. IV catheter discontinued intact. Site without signs and symptoms of complications - no redness or edema noted at insertion site, patient denies c/o pain - only slight tenderness at site.  Dressing with slight pressure applied.  D/c Instructions-Education: Discharge instructions given to patient/family with verbalized understanding. D/c education completed with patient/family including follow up instructions, medication list, d/c activities limitations if indicated, with other d/c instructions as indicated by MD - patient able to verbalize understanding, all questions fully answered. Patient instructed to return to ED, call 911, or call MD for any changes in condition.  Patient escorted via WC, and D/C home via private auto.  Kizzie Bane, RN 11/04/2021 4:05 PM

## 2021-11-04 NOTE — Progress Notes (Signed)
PT Cancellation Note  Patient Details Name: Allen Mayer MRN: [id number] DOB: 1960-10-29   Cancelled Treatment:    Reason Eval/Treat Not Completed: Patient declined, no reason specified. Pt refusing PT treatment at this time, stating that the staff are preparing to remove his leads so he can leave the hospital. PT provides reassurance that therapy could be completed prior to this however pt adamantly declines mobilizing at this time. PT will attempt to follow up as time allows and as the patient is willing.   Arlyss Gandy 11/04/2021, 11:18 AM

## 2021-11-04 NOTE — Progress Notes (Signed)
Initial Nutrition Assessment  DOCUMENTATION CODES:   Non-severe (moderate) malnutrition in context of social or environmental circumstances  INTERVENTION:  Ensure Enlive po BID, each supplement provides 350 kcal and 20 grams of protein. MVI with minerals daily  NUTRITION DIAGNOSIS:   Moderate Malnutrition related to social / environmental circumstances (homeless, cocaine use, alcohol use) as evidenced by mild fat depletion, moderate fat depletion, moderate muscle depletion, mild muscle depletion.  GOAL:   Patient will meet greater than or equal to 90% of their needs  MONITOR:   PO intake, Supplement acceptance, Labs, Weight trends, I & O's  REASON FOR ASSESSMENT:   Consult Assessment of nutrition requirement/status  ASSESSMENT:   Pt admitted with chest pain r/t decompensated HF and NSTEMI. PMH significant for CHF, LV thrombus, schizophrenia, COPD, alcohol use and cocaine use.  Pt sitting up in bed, eating lunch at time of visit. Reports no changes to his intake recently. States that he eats the same outside of the hospital as he has during admission. Usually eats 3 times per day but only eats about half of a meal then stops as he is full. Provided Ensure during assessment.   Meal completions: 07/26: 100%-breakfast, 85%-lunch, 100%-dinner 07/27: 100%-breakfast, 100%-lunch   Uses crutches for mobility assistance d/t swollen knees.  Endorses wt loss during admission of 15 lbs. Likely r/t diuresis. Reviewed wt history. Noted a 14.6% wt loss within the last year. Unable to determine how much wt loss is true dry wt loss versus fluid loss given h/o CHF.  Briefly discussed eating a well balanced diet and limiting intake of sodium as able for heart health, overall wellness and muscle preservation/repletion.   Medications: torsemide  Labs: sodium 134, CBG's 118-125 x24 hours  UOP: x24 hours I/O's: - since admission    NUTRITION - FOCUSED PHYSICAL  EXAM:  Flowsheet Row Most Recent Value  Orbital Region Moderate depletion  Upper Arm Region Moderate depletion  Thoracic and Lumbar Region Mild depletion  Buccal Region Mild depletion  Temple Region Mild depletion  Clavicle Bone Region No depletion  Clavicle and Acromion Bone Region No depletion  Scapular Bone Region No depletion  Dorsal Hand No depletion  Patellar Region Moderate depletion  Anterior Thigh Region Moderate depletion  Posterior Calf Region Mild depletion  Edema (RD Assessment) None  Hair Reviewed  Eyes Reviewed  Mouth Reviewed  Skin Reviewed  Nails Reviewed       Diet Order:   Diet Order             Diet - low sodium heart healthy           Diet Heart Room service appropriate? Yes; Fluid consistency: Thin  Diet effective now                   EDUCATION NEEDS:   Education needs have been addressed  Skin:  Skin Assessment: Reviewed RN Assessment  Last BM:  unknown  Height:   Ht Readings from Last 1 Encounters:  11/02/21 6' (1.829 m)    Weight:   Wt Readings from Last 1 Encounters:  11/04/21 77.5 kg   BMI:  Body mass index is 23.18 kg/m.  Estimated Nutritional Needs:   Kcal:  2000-2200  Protein:  100-115g  Fluid:  >/=2L  Drusilla Kanner, RDN, LDN Clinical Nutrition
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
## 2021-11-04 NOTE — Plan of Care (Signed)

## 2021-11-04 NOTE — Progress Notes (Signed)
OT Cancellation Note  Patient Details Name: Boe Deans MRN: [id number] DOB: May 31, 1960   Cancelled Treatment:    Reason Eval/Treat Not Completed: Patient declined, no reason specified (Patient stating increased left knee pain and asked therapist to return later.) Alfonse Flavors, OTA Acute Rehabilitation Services  Office [phone]  Dewain Penning 11/04/2021, 9:53 AM

## 2021-11-04 NOTE — Progress Notes (Signed)
Cardiology Progress Note  Patient ID: Allen Mayer MRN: [id number] DOB: [birth year] Date of Encounter: 11/04/2021  Primary Cardiologist: Rollene Rotunda, MD  Subjective   Chief Complaint: None.   HPI: No CP. Doing well.   ROS:  All other ROS reviewed and negative. Pertinent positives noted in the HPI.     Inpatient Medications  Scheduled Meds:  aspirin  81 mg Oral Daily   atorvastatin  80 mg Oral Daily   citalopram  20 mg Oral Daily   digoxin  0.125 mg Oral Daily   losartan  25 mg Oral Daily   nicotine  21 mg Transdermal Daily   sodium chloride flush  3 mL Intravenous Q12H   spironolactone  12.5 mg Oral Daily   torsemide  20 mg Oral Daily   Continuous Infusions:  sodium chloride     heparin 1,700 Units/hr (11/04/21 0150)   PRN Meds: sodium chloride, acetaminophen **OR** acetaminophen, albuterol, diclofenac Sodium, gabapentin, HYDROcodone-acetaminophen, sodium chloride flush   Vital Signs   Vitals:   11/04/21 0154 11/04/21 0430 11/04/21 0440 11/04/21 0718  BP:   (!) 127/115 111/86  Pulse:   71 62  Resp:   18 18  Temp:  97.6 F (36.4 C) 97.6 F (36.4 C) 97.8 F (36.6 C)  TempSrc:  Oral Oral Oral  SpO2:   90% 93%  Weight: 77.5 kg     Height:        Intake/Output Summary (Last 24 hours) at 11/04/2021 0949 Last data filed at 11/04/2021 0830 Gross per 24 hour  Intake 1649.55 ml  Output 1975 ml  Net -325.45 ml      11/04/2021    1:54 AM 11/03/2021   12:22 AM 11/02/2021   12:54 PM  Last 3 Weights  Weight (lbs) 170 lb 14.4 oz 172 lb 173 lb 11.6 oz  Weight (kg) 77.52 kg 78.019 kg 78.8 kg      Telemetry  Overnight telemetry shows SR 70s, which I personally reviewed.   Physical Exam   Vitals:   11/04/21 0154 11/04/21 0430 11/04/21 0440 11/04/21 0718  BP:   (!) 127/115 111/86  Pulse:   71 62  Resp:   18 18  Temp:  97.6 F (36.4 C) 97.6 F (36.4 C) 97.8 F (36.6 C)  TempSrc:  Oral Oral Oral  SpO2:   90% 93%  Weight: 77.5 kg     Height:         Intake/Output Summary (Last 24 hours) at 11/04/2021 0949 Last data filed at 11/04/2021 0830 Gross per 24 hour  Intake 1649.55 ml  Output 1975 ml  Net -325.45 ml       11/04/2021    1:54 AM 11/03/2021   12:22 AM 11/02/2021   12:54 PM  Last 3 Weights  Weight (lbs) 170 lb 14.4 oz 172 lb 173 lb 11.6 oz  Weight (kg) 77.52 kg 78.019 kg 78.8 kg    Body mass index is 23.18 kg/m.  General: Well nourished, well developed, in no acute distress Head: Atraumatic, normal size  Eyes: PEERLA, EOMI  Neck: Supple, no JVD Endocrine: No thryomegaly Cardiac: Normal S1, S2; RRR; no murmurs, rubs, or gallops Lungs: Clear to auscultation bilaterally, no wheezing, rhonchi or rales  Abd: Soft, nontender, no hepatomegaly  Ext: No edema, pulses 2+ Musculoskeletal: No deformities, BUE and BLE strength normal and equal Skin: Warm and dry, no rashes   Neuro: Alert and oriented to person, place, time, and situation, CNII-XII grossly intact, no focal deficits  Psych:  Normal mood and affect   Labs  High Sensitivity Troponin:   Recent Labs  Lab 11/01/21 1805 11/01/21 2050 11/01/21 2330 11/02/21 0104 11/02/21 0315  TROPONINIHS 79* 318* 823* 1,229* 2,311*     Cardiac EnzymesNo results for input(s): "TROPONINI" in the last 168 hours. No results for input(s): "TROPIPOC" in the last 168 hours.  Chemistry Recent Labs  Lab 11/01/21 1527 11/02/21 0104 11/02/21 0315 11/03/21 1008 11/04/21 0149  NA 135  --  138 133* 134*  K 4.0  --  3.7 3.6 4.2  CL 102  --  101 101 101  CO2 24  --  29 24 25   GLUCOSE 102*  --  105* 130* 108*  BUN 18  --  15 17 23   CREATININE 0.92  --  1.09 0.95 1.10  CALCIUM 8.5*  --  9.0 8.7* 8.7*  PROT 6.9 6.7 7.4  --   --   ALBUMIN 3.1* 2.7* 3.0*  --   --   AST 26 34 41  --   --   ALT 35 31 32  --   --   ALKPHOS 82 73 80  --   --   BILITOT 1.2 1.5* 1.7*  --   --   GFRNONAA >60  --  >60 >60 >60  ANIONGAP 9  --  8 8 8     Hematology Recent Labs  Lab 11/02/21 0315 11/03/21 0143  11/04/21 0149  WBC 6.4 5.9 5.4  RBC 4.75 4.68 4.69  HGB 13.7 13.5 13.3  HCT 41.6 40.5 41.1  MCV 87.6 86.5 87.6  MCH 28.8 28.8 28.4  MCHC 32.9 33.3 32.4  RDW 14.3 14.4 14.4  PLT 188 161 186   BNP Recent Labs  Lab 11/02/21 0315  BNP 1,448.9*    DDimer No results for input(s): "DDIMER" in the last 168 hours.   Radiology  DG Knee 1-2 Views Left  Result Date: 11/02/2021 CLINICAL DATA:  Left knee pain, swelling and fluid for 1 year. EXAM: LEFT KNEE - 1-2 VIEW COMPARISON:  Left knee radiographs 10/07/2021 FINDINGS: Moderate medial compartment joint space narrowing and peripheral osteophytosis. Moderate to large joint effusion is similar to prior. There are 2 chronic ossicles again at the superior aspect of the tibial tubercle, likely the sequela of remote Osgood-Schlatter disease. IMPRESSION: Moderate medial compartment osteoarthritis. Moderate to large joint effusion. Unchanged ossicles at the superior aspect of the tibial tubercle consistent with chronic Osgood-Schlatter disease. Electronically Signed   By: 11/04/21 M.D.   On: 11/02/2021 14:15   ECHOCARDIOGRAM LIMITED  Result Date: 11/02/2021    ECHOCARDIOGRAM LIMITED REPORT   Patient Name:   Allen Mayer Date of Exam: 11/02/2021 Medical Rec #:  11/04/2021        Height:       72.0 in Accession #:    [name]       Weight:       180.0 lb Date of Birth:  [birth year]       BSA:          2.037 m Patient Age:    60 years         BP:           137/105 mmHg Patient Gender: M                HR:           94 bpm. Exam Location:  Inpatient Procedure: Limited Echo and Intracardiac Opacification Agent Indications:     Left  ventricle Mural Thrombus  History:         Patient has prior history of Echocardiogram examinations, most                  recent 11/02/2021. CHF, COPD and Pulmonary HTN; Risk                  Factors:Hypertension and Current Smoker. Polysubstance Abuse.  Sonographer:     Johny Chess RDCS Referring Phys:  [place]  Diagnosing Phys: [name] IMPRESSIONS  1. LVEF severely reduced EF < 20%. With contrast, no evidence of LV thrombus. FINDINGS  Left Ventricle: LVEF severely reduced EF < 20%. With contrast, no evidence of LV thrombus. IVC IVC diam: 1.60 cm Allen Mayer Electronically signed by Allen Mayer Signature Date/Time: 11/02/2021/12:21:53 PM    Final (Updated)    ECHOCARDIOGRAM COMPLETE  Result Date: 11/02/2021    ECHOCARDIOGRAM REPORT   Patient Name:   JABARI JOUETT Date of Exam: 11/02/2021 Medical Rec #:  [id number]        Height:       72.0 in Accession #:    [id number]       Weight:       180.0 lb Date of Birth:  11-04-1960       BSA:          2.037 m Patient Age:    19 years         BP:           137/105 mmHg Patient Gender: M                HR:           93 bpm. Exam Location:  Inpatient Procedure: 2D Echo Indications:    acute systolic chf  History:        Patient has prior history of Echocardiogram examinations, most                 recent 08/01/2021. Cardiomyopathy, COPD, Signs/Symptoms:Chest                 Pain; Risk Factors:Current Smoker and cocaine use.  Sonographer:    Johny Chess RDCS Referring Phys: [id number] ANASTASSIA DOUTOVA  Sonographer Comments: Image acquisition challenging due to uncooperative patient. IMPRESSIONS  1. Spontaneous contrast is visualized, challenging to definitively rule out LV thrombus without contrast. Compared to prior study, no large thrombus is seen. Left ventricular ejection fraction, by estimation, is <20%. The left ventricle has severely decreased function. The left ventricle demonstrates global hypokinesis. The left ventricular internal cavity size was moderately dilated. Left ventricular diastolic parameters are indeterminate.  2. Right ventricular systolic function is severely reduced. The right ventricular size is mildly enlarged.  3. Left atrial size was moderately dilated.  4. Right atrial size was moderately dilated.  5. The mitral valve is normal in structure. Mild  mitral valve regurgitation.  6. The aortic valve is normal in structure. Aortic valve regurgitation is not visualized.  7. IVC not visualized. Conclusion(s)/Recommendation(s): Large LV thrombus has resolved compared to prior study. FINDINGS  Left Ventricle: Spontaneous contrast is visualized, challenging to definitively rule out LV thrombus without contrast. Compared to prior study, no large thrombus is seen. Left ventricular ejection fraction, by estimation, is <20%. The left ventricle has  severely decreased function. The left ventricle demonstrates global hypokinesis. The left ventricular internal cavity size was moderately dilated. There is no left ventricular hypertrophy. Left ventricular diastolic parameters are indeterminate. Right Ventricle:  The right ventricular size is mildly enlarged. Right ventricular systolic function is severely reduced. Left Atrium: Left atrial size was moderately dilated. Right Atrium: Right atrial size was moderately dilated. Pericardium: There is no evidence of pericardial effusion. Mitral Valve: The mitral valve is normal in structure. Mild mitral valve regurgitation. Tricuspid Valve: The tricuspid valve is normal in structure. Tricuspid valve regurgitation is mild. Aortic Valve: The aortic valve is normal in structure. Aortic valve regurgitation is not visualized. Pulmonic Valve: The pulmonic valve was normal in structure. Pulmonic valve regurgitation is not visualized. Aorta: The aortic root and ascending aorta are structurally normal, with no evidence of dilitation. Venous: IVC not visualized.  LEFT VENTRICLE PLAX 2D LVIDd:         6.90 cm LVIDs:         6.50 cm LV PW:         1.00 cm LV IVS:        1.00 cm LVOT diam:     2.00 cm LV SV:         31 LV SV Index:   15 LVOT Area:     3.14 cm  RIGHT VENTRICLE RV S prime:     9.01 cm/s TAPSE (M-mode): 1.2 cm LEFT ATRIUM           Index        RIGHT ATRIUM           Index LA diam:      4.50 cm 2.21 cm/m   RA Area:     21.90 cm LA  Vol (A2C): 74.1 ml 36.37 ml/m  RA Volume:   66.50 ml  32.64 ml/m  AORTIC VALVE LVOT Vmax:   82.70 cm/s LVOT Vmean:  50.700 cm/s LVOT VTI:    0.099 m  AORTA Ao Root diam: 2.90 cm Ao Asc diam:  3.20 cm TRICUSPID VALVE TR Peak grad:   19.7 mmHg TR Vmax:        222.00 cm/s  SHUNTS Systemic VTI:  0.10 m Systemic Diam: 2.00 cm Allen Mayer Electronically signed by Allen Mayer Signature Date/Time: 11/02/2021/10:38:19 AM    Final     Cardiac Studies  TTE 11/02/2021  1. Spontaneous contrast is visualized, challenging to definitively rule  out LV thrombus without contrast. Compared to prior study, no large  thrombus is seen. Left ventricular ejection fraction, by estimation, is  <20%. The left ventricle has severely  decreased function. The left ventricle demonstrates global hypokinesis.  The left ventricular internal cavity size was moderately dilated. Left  ventricular diastolic parameters are indeterminate.   2. Right ventricular systolic function is severely reduced. The right  ventricular size is mildly enlarged.   3. Left atrial size was moderately dilated.   4. Right atrial size was moderately dilated.   5. The mitral valve is normal in structure. Mild mitral valve  regurgitation.   6. The aortic valve is normal in structure. Aortic valve regurgitation is  not visualized.   7. IVC not visualized.   Patient Profile  61 year old male with history of nonischemic cardiomyopathy, LV thrombus, cocaine abuse, COPD, tobacco abuse who was admitted on 11/02/2021 with chest pain and elevated troponin concerning for non-STEMI.  Assessment & Plan   #NSTEMI #Cocaine Abuse -Admitted with elevated troponin and chest pain in the setting of cocaine use.  Had left heart catheterization recently with nonobstructive CAD. -Medical management recommended in the setting of end-stage cardiomyopathy as well as ongoing cocaine abuse. -He has completed 48 hours of heparin.  Transition back to Xarelto. -Continue  aspirin and statin. -No ST elevation.  EKG is unchanged.  No need for invasive angiography.  Would recommend medical management as we are doing.  #Acute on chronic systolic heart failure, EF 10 to 15% #End-stage cardiomyopathy -Right heart catheterization from April showed low output congestive heart failure.  He has ongoing cocaine abuse.  His LV is 7 cm. -He appears euvolemic.  We will continue with 20 mg of torsemide daily. -No beta-blocker given low output heart failure. -Continue digoxin 0.25 mg daily. -Continue losartan 25 mg daily as well as Aldactone 12.5 mg daily. -Unclear benefit of aggressive care.  His ongoing substance abuse is an issue.  Would recommend he follow-up in clinic to ensure that he can be compliant as well as refrain from drug abuse we will continue with his medical care.  #LV thrombus -Resolved on this echo this admission.  Would still continue Xarelto in the setting of low EF and LV thrombus.  His EF is not improved.  #Cocaine abuse -Cessation advised  [facility] will sign off.   Medication Recommendations: Medical therapy as above. Other recommendations (labs, testing, etc): None. Follow up as an outpatient:   we will arrange outpatient follow-up in 1 to 2 weeks  For questions or updates, please contact [place] Please consult [email or web address] for contact info under        Signed, [place] T. Audie Box, MD, [place]  11/04/2021 9:49 AM

## 2021-11-18 ENCOUNTER — Encounter: Payer: Self-pay | Admitting: *Deleted

## 2021-11-18 NOTE — Congregational Nurse Program (Signed)
Pt comes to nurse this am for weight. Pt c/o being "fussed at for staying out late" he is counseled to always

## 2021-11-22 ENCOUNTER — Other Ambulatory Visit (HOSPITAL_COMMUNITY): Payer: Self-pay

## 2021-11-22 ENCOUNTER — Telehealth (HOSPITAL_COMMUNITY): Payer: Self-pay | Admitting: *Deleted

## 2021-11-22 ENCOUNTER — Inpatient Hospital Stay (HOSPITAL_COMMUNITY): Admit: 2021-11-22 | Discharge: 2021-11-22 | Disposition: A | Discharge status: Home / Self Care | Payer: Medicaid Other

## 2021-11-22 NOTE — Telephone Encounter (Signed)
Call attempted to confirm [facility] TOC appt 11 am on 11/22/21. HIPPA appropriate VM left with callback number.    Rhae Hammock, BSN, Scientist, clinical (histocompatibility and immunogenetics) Only

## 2021-11-23 ENCOUNTER — Inpatient Hospital Stay (INDEPENDENT_AMBULATORY_CARE_PROVIDER_SITE_OTHER): Payer: Medicaid Other | Admitting: Primary Care

## 2021-11-25 ENCOUNTER — Encounter: Payer: Self-pay | Admitting: *Deleted

## 2021-11-25 NOTE — Congregational Nurse Program (Signed)
Pt came to RN as she was leaving and ask to help him with meds, states just tell me what they are and if I really need to take them Digoxin Asa Losartan Xarelto Spironolactone Torsemide He is ask to take all of them as directed on bottles and if he will consider allowing RN to fill pillbox and go over med list with him next wed. He is not sure he desires that but is encouraged to think about it.

## 2021-12-05 ENCOUNTER — Encounter (HOSPITAL_COMMUNITY): Payer: Self-pay

## 2021-12-05 ENCOUNTER — Emergency Department (HOSPITAL_COMMUNITY)
Admission: EM | Admit: 2021-12-05 | Discharge: 2021-12-05 | Discharge status: Against Medical Advice | Payer: Medicaid Other | Attending: Emergency Medicine | Admitting: Emergency Medicine

## 2021-12-05 ENCOUNTER — Ambulatory Visit (HOSPITAL_COMMUNITY): Admission: RE | Admit: 2021-12-05 | Payer: Medicaid Other | Source: Ambulatory Visit

## 2021-12-05 ENCOUNTER — Other Ambulatory Visit: Payer: Self-pay

## 2021-12-05 DIAGNOSIS — R079 Chest pain, unspecified: Secondary | ICD-10-CM | POA: Diagnosis present

## 2021-12-05 DIAGNOSIS — Z5321 Procedure and treatment not carried out due to patient leaving prior to being seen by health care provider: Secondary | ICD-10-CM | POA: Insufficient documentation

## 2021-12-05 DIAGNOSIS — R059 Cough, unspecified: Secondary | ICD-10-CM | POA: Diagnosis not present

## 2021-12-05 LAB — BASIC METABOLIC PANEL
Anion gap: 9 (ref 5–15)
BUN: 22 mg/dL (ref 8–23)
CO2: 23 mmol/L (ref 22–32)
Calcium: 8.9 mg/dL (ref 8.9–10.3)
Chloride: 105 mmol/L (ref 98–111)
Creatinine, Ser: 1.35 mg/dL — ABNORMAL HIGH (ref 0.61–1.24)
GFR, Estimated: 59 mL/min — ABNORMAL LOW (ref >60)
Glucose, Bld: 68 mg/dL — ABNORMAL LOW (ref 70–99)
Potassium: 4 mmol/L (ref 3.5–5.1)
Sodium: 137 mmol/L (ref 135–145)

## 2021-12-05 LAB — CBC
HCT: 41 % (ref 39.0–52.0)
Hemoglobin: 12.9 g/dL — ABNORMAL LOW (ref 13.0–17.0)
MCH: 29.2 pg (ref 26.0–34.0)
MCHC: 31.5 g/dL (ref 30.0–36.0)
MCV: 92.8 fL (ref 80.0–100.0)
Platelets: 197 10*3/uL (ref 150–400)
RBC: 4.42 MIL/uL (ref 4.22–5.81)
RDW: 17.5 % — ABNORMAL HIGH (ref 11.5–15.5)
WBC: 5.2 10*3/uL (ref 4.0–10.5)
nRBC: 0 % (ref 0.0–0.2)

## 2021-12-05 LAB — TROPONIN I (HIGH SENSITIVITY): Troponin I (High Sensitivity): 37 ng/L — ABNORMAL HIGH (ref <18)

## 2021-12-05 NOTE — ED Triage Notes (Signed)
Patient complains of intermittent chest pain with cough x 4 days. Pain worse with inspiration and movement

## 2021-12-05 NOTE — ED Notes (Signed)
Pt has been called for x-ray and has not answered I have check pt in lobby
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
## 2021-12-06 ENCOUNTER — Ambulatory Visit: Payer: Medicaid Other | Attending: Cardiology | Admitting: Cardiology

## 2021-12-06 DIAGNOSIS — I5023 Acute on chronic systolic (congestive) heart failure: Secondary | ICD-10-CM | POA: Insufficient documentation

## 2021-12-06 DIAGNOSIS — I513 Intracardiac thrombosis, not elsewhere classified: Secondary | ICD-10-CM | POA: Insufficient documentation

## 2021-12-09 ENCOUNTER — Inpatient Hospital Stay (INDEPENDENT_AMBULATORY_CARE_PROVIDER_SITE_OTHER): Payer: Medicaid Other | Admitting: Primary Care

## 2021-12-19 ENCOUNTER — Other Ambulatory Visit: Payer: Self-pay

## 2021-12-19 ENCOUNTER — Encounter (HOSPITAL_COMMUNITY): Payer: Self-pay | Admitting: Emergency Medicine

## 2021-12-19 ENCOUNTER — Emergency Department (HOSPITAL_COMMUNITY)
Admission: EM | Admit: 2021-12-19 | Discharge: 2021-12-20 | Disposition: A | Discharge status: Home / Self Care | Payer: Medicaid Other | Attending: Emergency Medicine | Admitting: Emergency Medicine

## 2021-12-19 ENCOUNTER — Emergency Department (HOSPITAL_COMMUNITY): Payer: Medicaid Other

## 2021-12-19 DIAGNOSIS — J189 Pneumonia, unspecified organism: Secondary | ICD-10-CM

## 2021-12-19 DIAGNOSIS — Z7982 Long term (current) use of aspirin: Secondary | ICD-10-CM | POA: Insufficient documentation

## 2021-12-19 DIAGNOSIS — Z20822 Contact with and (suspected) exposure to covid-19: Secondary | ICD-10-CM | POA: Insufficient documentation

## 2021-12-19 DIAGNOSIS — I509 Heart failure, unspecified: Secondary | ICD-10-CM | POA: Diagnosis not present

## 2021-12-19 DIAGNOSIS — Z7901 Long term (current) use of anticoagulants: Secondary | ICD-10-CM | POA: Diagnosis not present

## 2021-12-19 DIAGNOSIS — J449 Chronic obstructive pulmonary disease, unspecified: Secondary | ICD-10-CM | POA: Insufficient documentation

## 2021-12-19 DIAGNOSIS — R0602 Shortness of breath: Secondary | ICD-10-CM | POA: Diagnosis present

## 2021-12-19 DIAGNOSIS — J181 Lobar pneumonia, unspecified organism: Secondary | ICD-10-CM | POA: Insufficient documentation

## 2021-12-19 DIAGNOSIS — Z79899 Other long term (current) drug therapy: Secondary | ICD-10-CM | POA: Diagnosis not present

## 2021-12-19 DIAGNOSIS — Z7951 Long term (current) use of inhaled steroids: Secondary | ICD-10-CM | POA: Insufficient documentation

## 2021-12-19 LAB — CBC WITH DIFFERENTIAL/PLATELET
Abs Immature Granulocytes: 0.01 10*3/uL (ref 0.00–0.07)
Basophils Absolute: 0.1 10*3/uL (ref 0.0–0.1)
Basophils Relative: 1 %
Eosinophils Absolute: 0.2 10*3/uL (ref 0.0–0.5)
Eosinophils Relative: 4 %
HCT: 46.1 % (ref 39.0–52.0)
Hemoglobin: 14.7 g/dL (ref 13.0–17.0)
Immature Granulocytes: 0 %
Lymphocytes Relative: 29 %
Lymphs Abs: 1.9 10*3/uL (ref 0.7–4.0)
MCH: 29.8 pg (ref 26.0–34.0)
MCHC: 31.9 g/dL (ref 30.0–36.0)
MCV: 93.3 fL (ref 80.0–100.0)
Monocytes Absolute: 0.6 10*3/uL (ref 0.1–1.0)
Monocytes Relative: 9 %
Neutro Abs: 3.7 10*3/uL (ref 1.7–7.7)
Neutrophils Relative %: 57 %
Platelets: 236 10*3/uL (ref 150–400)
RBC: 4.94 MIL/uL (ref 4.22–5.81)
RDW: 17.7 % — ABNORMAL HIGH (ref 11.5–15.5)
WBC: 6.5 10*3/uL (ref 4.0–10.5)
nRBC: 0 % (ref 0.0–0.2)

## 2021-12-19 LAB — RESP PANEL BY RT-PCR (FLU A&B, COVID) ARPGX2
Influenza A by PCR: NEGATIVE
Influenza B by PCR: NEGATIVE
SARS Coronavirus 2 by RT PCR: NEGATIVE

## 2021-12-19 LAB — BASIC METABOLIC PANEL
Anion gap: 10 (ref 5–15)
BUN: 16 mg/dL (ref 8–23)
CO2: 25 mmol/L (ref 22–32)
Calcium: 9.3 mg/dL (ref 8.9–10.3)
Chloride: 103 mmol/L (ref 98–111)
Creatinine, Ser: 1.3 mg/dL — ABNORMAL HIGH (ref 0.61–1.24)
GFR, Estimated: >60 mL/min (ref >60)
Glucose, Bld: 98 mg/dL (ref 70–99)
Potassium: 5 mmol/L (ref 3.5–5.1)
Sodium: 138 mmol/L (ref 135–145)

## 2021-12-19 LAB — TROPONIN I (HIGH SENSITIVITY): Troponin I (High Sensitivity): 40 ng/L — ABNORMAL HIGH (ref <18)

## 2021-12-19 LAB — DIGOXIN LEVEL: Digoxin Level: 0.5 ng/mL — ABNORMAL LOW (ref 0.8–2.0)

## 2021-12-19 LAB — BRAIN NATRIURETIC PEPTIDE: B Natriuretic Peptide: 2452.1 pg/mL — ABNORMAL HIGH (ref 0.0–100.0)

## 2021-12-19 MED ORDER — HYDROCODONE-ACETAMINOPHEN 5-325 MG PO TABS
2.0000 | ORAL_TABLET | Freq: Once | ORAL | Status: AC
  Administered 2021-12-19: 2 via ORAL
  Filled 2021-12-19: qty 2

## 2021-12-19 MED ORDER — SODIUM CHLORIDE 0.9 % IV SOLN
500.0000 mg | Freq: Once | INTRAVENOUS | Status: AC
  Administered 2021-12-19: 500 mg via INTRAVENOUS
  Filled 2021-12-19: qty 5

## 2021-12-19 MED ORDER — ALBUTEROL SULFATE HFA 108 (90 BASE) MCG/ACT IN AERS
2.0000 | INHALATION_SPRAY | Freq: Once | RESPIRATORY_TRACT | Status: AC
Start: 2021-12-19 — End: 2021-12-19
  Administered 2021-12-19: 2 via RESPIRATORY_TRACT
  Filled 2021-12-19: qty 6.7

## 2021-12-19 MED ORDER — FUROSEMIDE 10 MG/ML IJ SOLN
40.0000 mg | Freq: Once | INTRAMUSCULAR | Status: AC
  Administered 2021-12-19: 40 mg via INTRAVENOUS
  Filled 2021-12-19: qty 4

## 2021-12-19 NOTE — ED Provider Triage Note (Signed)
Emergency Medicine Provider Triage Evaluation Note  Kimble Hitchens , a 61 y.o. male  was evaluated in triage.  Pt complains of chest tightness, shortness of breath and cough for the last 3 days.  Cough productive of yellow sputum as well as some blood-tinged hemoptysis. No sick contacts.  He is noncompliant with all of his medications at home.  He also has pain to his bilateral knees and right shoulder.  Still uses tobacco.  No lower extremity swelling.  Unknown if sick contacts.  Review of Systems  Positive: Cough, sob, chest tightness Negative: fever  Physical Exam  BP (!) 125/109 (BP Location: Left Arm)   Pulse 85   Temp 97.7 F (36.5 C) (Oral)   Resp 17   Ht 6' (1.829 m)   Wt 81.6 kg   SpO2 100%   BMI 24.41 kg/m  Gen:   Awake, no distress   Resp:  Expiratory wheeze MSK:   Moves extremities without difficulty  Other:    Medical Decision Making  Medically screening exam initiated at 8:31 PM.  Appropriate orders placed.  Adriann Poyser was informed that the remainder of the evaluation will be completed by another provider, this initial triage assessment does not replace that evaluation, and the importance of remaining in the ED until their evaluation is complete.  SOB   Denny Mccree A, PA-C 12/19/21 2031

## 2021-12-19 NOTE — ED Provider Notes (Signed)
[facility] EMERGENCY DEPARTMENT Provider Note   CSN: [id number] Arrival date & time: 12/19/21  2018     History  Chief Complaint  Patient presents with   Cough   Shortness of Breath    Allen Mayer is a 61 y.o. male.  Patient is a 61 year old male with past medical history of congestive heart failure with EF less than 20% on most recent echo, mural thrombus on Xarelto, COPD, schizoaffective disorder.  Patient presenting today with complaints of shortness of breath and cough.  This has been worsening over the past several days.  His cough is productive of a whitish-yellow sputum that he tells me is occasionally tinged with blood.  He denies any fevers or chills.  He reports "pain all over" and sounds as though he is only intermittently compliant with his medications.  He denies ill contacts.  There are no aggravating or alleviating factors.  The history is provided by the patient.       Home Medications Prior to Admission medications   Medication Sig Start Date End Date Taking? Authorizing Provider  acetaminophen (TYLENOL) 500 MG tablet Take 1 tablet (500 mg total) by mouth every 6 (six) hours as needed. 08/13/21   Fayrene Helper, PA-C  albuterol (VENTOLIN HFA) 108 (90 Base) MCG/ACT inhaler Inhale 1-2 puffs into the lungs every 6 (six) hours as needed for wheezing or shortness of breath. 11/04/21   Arrien, York Ram, MD  aspirin 81 MG EC tablet Take 1 tablet (81 mg total) by mouth daily. Swallow whole. 08/03/21   Barrett, Joline Salt, PA-C  atorvastatin (LIPITOR) 80 MG tablet Take 1 tablet (80 mg total) by mouth daily. 08/03/21   Barrett, Joline Salt, PA-C  cyclobenzaprine (FLEXERIL) 10 MG tablet Take 1 tablet (10 mg total) by mouth 2 (two) times daily as needed for muscle spasms. 08/13/21   Fayrene Helper, PA-C  digoxin (LANOXIN) 0.125 MG tablet Take 1 tablet (0.125 mg total) by mouth daily. 11/05/21 12/05/21  Arrien, York Ram, MD  FLOVENT HFA 110 MCG/ACT inhaler  Inhale 2 puffs into the lungs 2 (two) times daily. Patient not taking: Reported on 11/02/2021 12/04/19   Rollene Rotunda, MD  gabapentin (NEURONTIN) 400 MG capsule Take 1 capsule (400 mg total) by mouth daily as needed (nerve pain). 05/22/20   Jodelle Gross, NP  losartan (COZAAR) 25 MG tablet Take 1 tablet (25 mg total) by mouth daily. 11/05/21 12/05/21  Arrien, York Ram, MD  nitroGLYCERIN (NITROSTAT) 0.4 MG SL tablet Place 1 tablet (0.4 mg total) under the tongue every 5 (five) minutes as needed for chest pain. 10/15/21   Rollene Rotunda, MD  rivaroxaban (XARELTO) 20 MG TABS tablet Take 1 tablet (20 mg total) by mouth daily with supper. 08/03/21   Barrett, Joline Salt, PA-C  spironolactone (ALDACTONE) 25 MG tablet Take 0.5 tablets (12.5 mg total) by mouth daily. 11/05/21 12/05/21  Arrien, York Ram, MD  torsemide (DEMADEX) 20 MG tablet Take 1 tablet (20 mg total) by mouth daily. 11/05/21 12/05/21  Arrien, York Ram, MD      Allergies    Patient has no known allergies.    Review of Systems   Review of Systems  All other systems reviewed and are negative.   Physical Exam Updated Vital Signs BP 136/77 (BP Location: Right Arm)   Pulse 71   Temp 97.7 F (36.5 C) (Oral)   Resp 16   Ht 6' (1.829 m)   Wt 81.6 kg   SpO2 99%  BMI 24.41 kg/m  Physical Exam Vitals and nursing note reviewed.  Constitutional:      General: He is not in acute distress.    Appearance: He is well-developed. He is not diaphoretic.  HENT:     Head: Normocephalic and atraumatic.  Cardiovascular:     Rate and Rhythm: Normal rate and regular rhythm.     Heart sounds: No murmur heard.    No friction rub.  Pulmonary:     Effort: Pulmonary effort is normal. No respiratory distress.     Breath sounds: Examination of the right-lower field reveals rales. Examination of the left-lower field reveals rales. Rales present. No wheezing.  Abdominal:     General: Bowel sounds are normal. There is no distension.      Palpations: Abdomen is soft.     Tenderness: There is no abdominal tenderness.  Musculoskeletal:        General: Normal range of motion.     Cervical back: Normal range of motion and neck supple.     Right lower leg: No tenderness. No edema.     Left lower leg: No tenderness. No edema.  Skin:    General: Skin is warm and dry.  Neurological:     Mental Status: He is alert and oriented to person, place, and time.     Coordination: Coordination normal.     ED Results / Procedures / Treatments   Labs (all labs ordered are listed, but only abnormal results are displayed) Labs Reviewed  CBC WITH DIFFERENTIAL/PLATELET - Abnormal; Notable for the following components:      Result Value   RDW 17.7 (*)    All other components within normal limits  BASIC METABOLIC PANEL - Abnormal; Notable for the following components:   Creatinine, Ser 1.30 (*)    All other components within normal limits  DIGOXIN LEVEL - Abnormal; Notable for the following components:   Digoxin Level 0.5 (*)    All other components within normal limits  BRAIN NATRIURETIC PEPTIDE - Abnormal; Notable for the following components:   B Natriuretic Peptide 2,452.1 (*)    All other components within normal limits  TROPONIN I (HIGH SENSITIVITY) - Abnormal; Notable for the following components:   Troponin I (High Sensitivity) 40 (*)    All other components within normal limits  RESP PANEL BY RT-PCR (FLU A&B, COVID) [id number]  TROPONIN I (HIGH SENSITIVITY)    EKG None  Radiology DG Chest 2 View  Result Date: 12/19/2021 CLINICAL DATA:  Cough, shortness of breath EXAM: CHEST - 2 VIEW COMPARISON:  11/01/2021 FINDINGS: Mild right perihilar opacity, overlying the spine on the lateral view, favoring superior segment right lower lobe pneumonia. Left lung is clear. No pleural effusion or pneumothorax. Mild cardiomegaly. IMPRESSION: Mild right perihilar opacity, favoring right lower lobe pneumonia. Electronically Signed   By:  Charline Bills M.D.   On: 12/19/2021 20:52    Procedures Procedures  {Document cardiac monitor, telemetry assessment procedure when appropriate:1}  Medications Ordered in ED Medications  furosemide (LASIX) injection 40 mg (has no administration in time range)  HYDROcodone-acetaminophen (NORCO/VICODIN) 5-325 MG per tablet 2 tablet (has no administration in time range)  azithromycin (ZITHROMAX) 500 mg in sodium chloride 0.9 % 250 mL IVPB (has no administration in time range)  albuterol (VENTOLIN HFA) 108 (90 Base) MCG/ACT inhaler 2 puff (2 puffs Inhalation Given 12/19/21 2034)    ED Course/ Medical Decision Making/ A&P  Medical Decision Making Risk Prescription drug management.   ***  {Document critical care time when appropriate:1} {Document review of labs and clinical decision tools ie heart score, Chads2Vasc2 etc:1}  {Document your independent review of radiology images, and any outside records:1} {Document your discussion with family members, caretakers, and with consultants:1} {Document social determinants of health affecting pt's care:1} {Document your decision making why or why not admission, treatments were needed:1} Final Clinical Impression(s) / ED Diagnoses Final diagnoses:  None    Rx / DC Orders ED Discharge Orders     None

## 2021-12-19 NOTE — ED Triage Notes (Signed)
Pt reports SOB, cough (w/ blood in sputum) and wheezing for about three days.  Pt reports he still smokes "a little"  He has been using his inhaler at home.

## 2021-12-20 LAB — TROPONIN I (HIGH SENSITIVITY): Troponin I (High Sensitivity): 43 ng/L — ABNORMAL HIGH (ref <18)

## 2021-12-20 MED ORDER — ATORVASTATIN CALCIUM 80 MG PO TABS: 80.0000 mg | ORAL_TABLET | Freq: Every day | ORAL | 11 refills | Status: DC

## 2021-12-20 MED ORDER — SPIRONOLACTONE 25 MG PO TABS: 12.5000 mg | ORAL_TABLET | Freq: Every day | ORAL | 0 refills | Status: DC

## 2021-12-20 MED ORDER — AZITHROMYCIN 250 MG PO TABS: 250.0000 mg | ORAL_TABLET | Freq: Every day | ORAL | 0 refills | Status: DC

## 2021-12-20 MED ORDER — CYCLOBENZAPRINE HCL 10 MG PO TABS: 10.0000 mg | ORAL_TABLET | Freq: Two times a day (BID) | ORAL | 0 refills | Status: DC | PRN

## 2021-12-20 MED ORDER — RIVAROXABAN 20 MG PO TABS: 20.0000 mg | ORAL_TABLET | Freq: Every day | ORAL | 0 refills | Status: DC

## 2021-12-20 MED ORDER — ALBUTEROL SULFATE HFA 108 (90 BASE) MCG/ACT IN AERS: 1.0000 | INHALATION_SPRAY | Freq: Four times a day (QID) | RESPIRATORY_TRACT | 0 refills | Status: DC | PRN

## 2021-12-20 MED ORDER — GABAPENTIN 400 MG PO CAPS: 400.0000 mg | ORAL_CAPSULE | Freq: Every day | ORAL | 1 refills | Status: DC | PRN

## 2021-12-20 MED ORDER — FLOVENT HFA 110 MCG/ACT IN AERO: 2.0000 | INHALATION_SPRAY | Freq: Two times a day (BID) | RESPIRATORY_TRACT | 1 refills | Status: DC

## 2021-12-20 MED ORDER — ASPIRIN 81 MG PO TBEC: 81.0000 mg | DELAYED_RELEASE_TABLET | Freq: Every day | ORAL | 11 refills | Status: DC

## 2021-12-20 MED ORDER — HYDROCODONE-ACETAMINOPHEN 5-325 MG PO TABS: 1.0000 | ORAL_TABLET | Freq: Four times a day (QID) | ORAL | 0 refills | Status: DC | PRN

## 2021-12-20 MED ORDER — DIGOXIN 125 MCG PO TABS: 0.1250 mg | ORAL_TABLET | Freq: Every day | ORAL | 0 refills | Status: DC

## 2021-12-20 MED ORDER — LOSARTAN POTASSIUM 25 MG PO TABS: 25.0000 mg | ORAL_TABLET | Freq: Every day | ORAL | 0 refills | Status: DC

## 2021-12-20 MED ORDER — TORSEMIDE 20 MG PO TABS: 20.0000 mg | ORAL_TABLET | Freq: Every day | ORAL | 0 refills | Status: DC

## 2021-12-20 NOTE — Discharge Instructions (Signed)
Continue home medications as previously prescribed.  Begin taking Zithromax as prescribed.  Take hydrocodone as prescribed as needed for pain.  Follow-up with primary doctor if symptoms are not improving in the next few days.

## 2022-01-01 ENCOUNTER — Encounter (HOSPITAL_COMMUNITY): Payer: Self-pay

## 2022-01-01 ENCOUNTER — Emergency Department (HOSPITAL_COMMUNITY): Payer: Medicaid Other

## 2022-01-01 ENCOUNTER — Emergency Department (HOSPITAL_COMMUNITY)
Admission: EM | Admit: 2022-01-01 | Discharge: 2022-01-02 | Disposition: A | Discharge status: Home / Self Care | Payer: Medicaid Other | Attending: Emergency Medicine | Admitting: Emergency Medicine

## 2022-01-01 DIAGNOSIS — I509 Heart failure, unspecified: Secondary | ICD-10-CM | POA: Insufficient documentation

## 2022-01-01 DIAGNOSIS — J441 Chronic obstructive pulmonary disease with (acute) exacerbation: Secondary | ICD-10-CM | POA: Insufficient documentation

## 2022-01-01 DIAGNOSIS — Z7982 Long term (current) use of aspirin: Secondary | ICD-10-CM | POA: Insufficient documentation

## 2022-01-01 DIAGNOSIS — Z7952 Long term (current) use of systemic steroids: Secondary | ICD-10-CM | POA: Diagnosis not present

## 2022-01-01 DIAGNOSIS — M7989 Other specified soft tissue disorders: Secondary | ICD-10-CM | POA: Insufficient documentation

## 2022-01-01 DIAGNOSIS — R059 Cough, unspecified: Secondary | ICD-10-CM | POA: Diagnosis not present

## 2022-01-01 DIAGNOSIS — Z20822 Contact with and (suspected) exposure to covid-19: Secondary | ICD-10-CM | POA: Insufficient documentation

## 2022-01-01 DIAGNOSIS — R0602 Shortness of breath: Secondary | ICD-10-CM | POA: Insufficient documentation

## 2022-01-01 LAB — CBC
HCT: 44.1 % (ref 39.0–52.0)
Hemoglobin: 14.1 g/dL (ref 13.0–17.0)
MCH: 29.3 pg (ref 26.0–34.0)
MCHC: 32 g/dL (ref 30.0–36.0)
MCV: 91.7 fL (ref 80.0–100.0)
Platelets: 303 10*3/uL (ref 150–400)
RBC: 4.81 MIL/uL (ref 4.22–5.81)
RDW: 16.2 % — ABNORMAL HIGH (ref 11.5–15.5)
WBC: 5.4 10*3/uL (ref 4.0–10.5)
nRBC: 0 % (ref 0.0–0.2)

## 2022-01-01 LAB — BASIC METABOLIC PANEL WITH GFR
Anion gap: 9 (ref 5–15)
BUN: 23 mg/dL (ref 8–23)
CO2: 20 mmol/L — ABNORMAL LOW (ref 22–32)
Calcium: 8.8 mg/dL — ABNORMAL LOW (ref 8.9–10.3)
Chloride: 106 mmol/L (ref 98–111)
Creatinine, Ser: 1.15 mg/dL (ref 0.61–1.24)
GFR, Estimated: >60 mL/min
Glucose, Bld: 84 mg/dL (ref 70–99)
Potassium: 4.5 mmol/L (ref 3.5–5.1)
Sodium: 135 mmol/L (ref 135–145)

## 2022-01-01 MED ORDER — IPRATROPIUM-ALBUTEROL 0.5-2.5 (3) MG/3ML IN SOLN
3.0000 mL | Freq: Once | RESPIRATORY_TRACT | Status: AC
  Administered 2022-01-02: 3 mL via RESPIRATORY_TRACT
  Filled 2022-01-01: qty 3

## 2022-01-01 NOTE — ED Provider Notes (Signed)
[facility]-EMERGENCY DEPT [facility] Emergency Department Provider Note MRN:  [id number]  Arrival date & time: 01/02/22     Chief Complaint   Shortness of Breath   History of Present Illness   Allen Mayer is a 61 y.o. year-old male presents to the ED with chief complaint of cough and shortness of breath.  States that he has had the symptoms for the past 2 days.  He denies any fevers or chills.  He also reports generalized arthralgias, but does not associate this with his cough.  Denies any successful treatments..  History provided by patient.   Review of Systems  Pertinent positive and negative review of systems noted in HPI.    Physical Exam   Vitals:   01/01/22 2329 01/02/22 0030  BP: (!) 133/98 (!) 129/102  Pulse: 97 77  Resp: (!) 23 17  Temp: 98 F (36.7 C)   SpO2: 98% 100%    CONSTITUTIONAL:  well-appearing, NAD NEURO:  Alert and oriented x 3, CN 3-12 grossly intact EYES:  eyes equal and reactive ENT/NECK:  Supple, no stridor  CARDIO:  normal rate, regular rhythm, appears well-perfused  PULM:  No respiratory distress, +rhonchi  GI/GU:  non-distended,  MSK/SPINE:  No gross deformities, no edema, moves all extremities  SKIN:  no rash, atraumatic   *Additional and/or pertinent findings included in MDM below  Diagnostic and Interventional Summary    EKG Interpretation  Date/Time:  Saturday January 01 2022 20:05:28 EDT Ventricular Rate:  93 PR Interval:  218 QRS Duration: 94 QT Interval:  370 QTC Calculation: 460 R Axis:   -66 Text Interpretation: Sinus rhythm with 1st degree A-V block Possible Left atrial enlargement Left axis deviation Incomplete right bundle branch block Minimal voltage criteria for LVH, may be normal variant ( Cornell product ) Inferior infarct , age undetermined Anterior infarct , age undetermined Abnormal ECG When compared with ECG of 19-Dec-2021 20:29, PREVIOUS ECG IS PRESENT Confirmed by Marily Memos [phone]) on 01/01/2022 11:13:32  PM       Labs Reviewed  CBC - Abnormal; Notable for the following components:      Result Value   RDW 16.2 (*)    All other components within normal limits  BASIC METABOLIC PANEL - Abnormal; Notable for the following components:   CO2 20 (*)    Calcium 8.8 (*)    All other components within normal limits  SARS CORONAVIRUS 2 BY RT PCR    DG Chest 2 View  Final Result      Medications  albuterol (VENTOLIN HFA) 108 (90 Base) MCG/ACT inhaler 2 puff (has no administration in time range)  ipratropium-albuterol (DUONEB) 0.5-2.5 (3) MG/3ML nebulizer solution 3 mL (3 mLs Nebulization Given 01/02/22 0011)     Procedures  /  Critical Care Procedures  ED Course and Medical Decision Making  I have reviewed the triage vital signs, the nursing notes, and pertinent available records from the EMR.  Social Determinants Affecting Complexity of Care: Patient has no clinically significant social determinants affecting this chief complaint..   ED Course:    Medical Decision Making Patient here with cough that is productive.  Hx of COPD.  CXR negative for infiltrate.  Discussed enlarged cardiac silhouette with Dr. Clayborne Dana, appears similar to priors.  Feels improved with regard to SOB after neb.  Still complains of chronic arthralgias and asks for rx for oxys.  Recommend that patient follow-up with PCP for chronic pain.    Amount and/or Complexity of Data Reviewed Labs:  ordered. Radiology: ordered and independent interpretation performed.    Details: Noted enlarged cardiac silhouette, but similar to many recent CXRs.  PCP follow-up.  Not hypotensive or tachycardic.  Risk Prescription drug management.     Consultants: No consultations were needed in caring for this patient.   Treatment and Plan: Emergency department workup does not suggest an emergent condition requiring admission or immediate intervention beyond  what has been performed at this time. The patient is safe for  discharge and has  been instructed to return immediately for worsening symptoms, change in  symptoms or any other concerns    Final Clinical Impressions(s) / ED Diagnoses     ICD-10-CM   1. COPD exacerbation (HCC)  J44.1       ED Discharge Orders          Ordered    azithromycin (ZITHROMAX) 250 MG tablet  Daily        01/02/22 0052    predniSONE (DELTASONE) 20 MG tablet  Daily        01/02/22 0052              Discharge Instructions Discussed with and Provided to Patient:     Discharge Instructions      Take antibiotics and prednisone as directed.  You need to follow-up with your regular doctor or pain management for your chronic joint pain.         Roxy Horseman, PA-C 01/02/22 0102    Alvira Monday, MD 01/03/22 2238

## 2022-01-01 NOTE — ED Triage Notes (Signed)
Pt reports that he has been having SOB since yesterday and coughing up yellow phlegm, hx of COPD. Pt is also c/o of generalized pain in his legs and arms that has been going on for a long time.

## 2022-01-02 ENCOUNTER — Other Ambulatory Visit: Payer: Self-pay

## 2022-01-02 ENCOUNTER — Encounter (HOSPITAL_COMMUNITY): Payer: Self-pay | Admitting: Emergency Medicine

## 2022-01-02 ENCOUNTER — Emergency Department (HOSPITAL_COMMUNITY)
Admission: EM | Admit: 2022-01-02 | Discharge: 2022-01-03 | Disposition: A | Discharge status: Home / Self Care | Payer: Medicaid Other | Source: Home / Self Care | Attending: Emergency Medicine | Admitting: Emergency Medicine

## 2022-01-02 ENCOUNTER — Emergency Department (HOSPITAL_COMMUNITY): Payer: Medicaid Other

## 2022-01-02 DIAGNOSIS — I509 Heart failure, unspecified: Secondary | ICD-10-CM | POA: Insufficient documentation

## 2022-01-02 DIAGNOSIS — Z7952 Long term (current) use of systemic steroids: Secondary | ICD-10-CM | POA: Insufficient documentation

## 2022-01-02 DIAGNOSIS — J449 Chronic obstructive pulmonary disease, unspecified: Secondary | ICD-10-CM | POA: Insufficient documentation

## 2022-01-02 DIAGNOSIS — R0602 Shortness of breath: Secondary | ICD-10-CM | POA: Insufficient documentation

## 2022-01-02 DIAGNOSIS — Z7982 Long term (current) use of aspirin: Secondary | ICD-10-CM | POA: Insufficient documentation

## 2022-01-02 DIAGNOSIS — M7989 Other specified soft tissue disorders: Secondary | ICD-10-CM | POA: Insufficient documentation

## 2022-01-02 DIAGNOSIS — R059 Cough, unspecified: Secondary | ICD-10-CM | POA: Insufficient documentation

## 2022-01-02 DIAGNOSIS — I5023 Acute on chronic systolic (congestive) heart failure: Secondary | ICD-10-CM

## 2022-01-02 LAB — SARS CORONAVIRUS 2 BY RT PCR: SARS Coronavirus 2 by RT PCR: NEGATIVE

## 2022-01-02 MED ORDER — PREDNISONE 20 MG PO TABS: 40.0000 mg | ORAL_TABLET | Freq: Every day | ORAL | 0 refills | Status: DC

## 2022-01-02 MED ORDER — AZITHROMYCIN 250 MG PO TABS: 250.0000 mg | ORAL_TABLET | Freq: Every day | ORAL | 0 refills | Status: DC

## 2022-01-02 MED ORDER — ALBUTEROL SULFATE HFA 108 (90 BASE) MCG/ACT IN AERS
2.0000 | INHALATION_SPRAY | RESPIRATORY_TRACT | Status: DC | PRN
Start: 2022-01-02 — End: 2022-01-02

## 2022-01-02 NOTE — Discharge Instructions (Signed)
Take antibiotics and prednisone as directed.  You need to follow-up with your regular doctor or pain management for your chronic joint pain.

## 2022-01-02 NOTE — ED Triage Notes (Signed)
Pt c/o bilateral leg/feet swelling, states he is also having trouble breathing b/c of "my stomach."  "I have pain everywhere."  Pt has multiple complaints.  Pt is speaking in complete sentences, alert and oriented.

## 2022-01-02 NOTE — ED Notes (Signed)
Resp at bedside to admin med

## 2022-01-02 NOTE — ED Provider Triage Note (Signed)
Emergency Medicine Provider Triage Evaluation Note  Shabazz Mckey , a 61 y.o. male  was evaluated in triage.  Pt complains of swelling arms legs and abdomen, shortness of breath, abdominal distention.  History of COPD and CHF with EF less than 20%.  Chest pain earlier 40 took nitro which resolved  Review of Systems  Positive: Shortness of breath, bilateral extremity swelling, chest pain Negative: Potation syncope  Physical Exam  BP (!) 134/95 (BP Location: Right Arm)   Pulse 81   Temp 97.7 F (36.5 C) (Oral)   Resp 17   SpO2 97%  Gen:   Awake, no distress   Resp:  Normal effort  MSK:   Moves extremities without difficulty  Other:  Rales in lung bases bilaterally, 1+ bilateral extremity edema without pitting  Medical Decision Making  Medically screening exam initiated at 11:14 PM.  Appropriate orders placed.  Antwaine Dyas was informed that the remainder of the evaluation will be completed by another provider, this initial triage assessment does not replace that evaluation, and the importance of remaining in the ED until their evaluation is complete.  This chart was dictated using voice recognition software, Dragon. Despite the best efforts of this provider to proofread and correct errors, errors may still occur which can change documentation meaning.    Emeline Darling, PA-C 01/02/22 2322

## 2022-01-03 LAB — CBC WITH DIFFERENTIAL/PLATELET
Abs Immature Granulocytes: 0.01 10*3/uL (ref 0.00–0.07)
Basophils Absolute: 0.1 10*3/uL (ref 0.0–0.1)
Basophils Relative: 2 %
Eosinophils Absolute: 0.4 10*3/uL (ref 0.0–0.5)
Eosinophils Relative: 7 %
HCT: 43.6 % (ref 39.0–52.0)
Hemoglobin: 14 g/dL (ref 13.0–17.0)
Immature Granulocytes: 0 %
Lymphocytes Relative: 31 %
Lymphs Abs: 1.6 10*3/uL (ref 0.7–4.0)
MCH: 29.7 pg (ref 26.0–34.0)
MCHC: 32.1 g/dL (ref 30.0–36.0)
MCV: 92.6 fL (ref 80.0–100.0)
Monocytes Absolute: 0.6 10*3/uL (ref 0.1–1.0)
Monocytes Relative: 11 %
Neutro Abs: 2.7 10*3/uL (ref 1.7–7.7)
Neutrophils Relative %: 49 %
Platelets: 273 10*3/uL (ref 150–400)
RBC: 4.71 MIL/uL (ref 4.22–5.81)
RDW: 16.2 % — ABNORMAL HIGH (ref 11.5–15.5)
WBC: 5.4 10*3/uL (ref 4.0–10.5)
nRBC: 0 % (ref 0.0–0.2)

## 2022-01-03 LAB — TROPONIN I (HIGH SENSITIVITY)
Troponin I (High Sensitivity): 38 ng/L — ABNORMAL HIGH (ref <18)
Troponin I (High Sensitivity): 34 ng/L — ABNORMAL HIGH (ref <18)

## 2022-01-03 LAB — COMPREHENSIVE METABOLIC PANEL
ALT: 144 U/L — ABNORMAL HIGH (ref 0–44)
AST: 112 U/L — ABNORMAL HIGH (ref 15–41)
Albumin: 2.9 g/dL — ABNORMAL LOW (ref 3.5–5.0)
Alkaline Phosphatase: 105 U/L (ref 38–126)
Anion gap: 10 (ref 5–15)
BUN: 26 mg/dL — ABNORMAL HIGH (ref 8–23)
CO2: 21 mmol/L — ABNORMAL LOW (ref 22–32)
Calcium: 8.9 mg/dL (ref 8.9–10.3)
Chloride: 107 mmol/L (ref 98–111)
Creatinine, Ser: 1.28 mg/dL — ABNORMAL HIGH (ref 0.61–1.24)
GFR, Estimated: >60 mL/min (ref >60)
Glucose, Bld: 86 mg/dL (ref 70–99)
Potassium: 4.5 mmol/L (ref 3.5–5.1)
Sodium: 138 mmol/L (ref 135–145)
Total Bilirubin: 0.7 mg/dL (ref 0.3–1.2)
Total Protein: 6.9 g/dL (ref 6.5–8.1)

## 2022-01-03 LAB — BRAIN NATRIURETIC PEPTIDE: B Natriuretic Peptide: 1944.7 pg/mL — ABNORMAL HIGH (ref 0.0–100.0)

## 2022-01-03 MED ORDER — FUROSEMIDE 10 MG/ML IJ SOLN
40.0000 mg | Freq: Once | INTRAMUSCULAR | Status: AC
  Administered 2022-01-03: 40 mg via INTRAVENOUS
  Filled 2022-01-03: qty 4

## 2022-01-03 MED ORDER — METHOCARBAMOL 500 MG PO TABS
500.0000 mg | ORAL_TABLET | Freq: Once | ORAL | Status: AC
  Administered 2022-01-03: 500 mg via ORAL
  Filled 2022-01-03: qty 1

## 2022-01-03 NOTE — Progress Notes (Signed)
CSW contacted [employer] in regards to patient getting a walker. CSW left a message.

## 2022-01-03 NOTE — ED Provider Notes (Signed)
[facility] EMERGENCY DEPARTMENT Provider Note   CSN: [id number] Arrival date & time: 01/02/22  2157     History {Add pertinent medical, surgical, social history, OB history to HPI:1} No chief complaint on file.   Allen Mayer is a 61 y.o. male.  HPI Patient presents with shortness of breath cough and pains in multiple areas.  States he has more swelling on his legs.  No fevers.  States he is having more trouble breathing.  History of COPD and CHF.  States has been compliant with his medicines but states he does not feel the medicines are making him urinate anymore.  Does not have a scale at home so does not weigh himself.    Home Medications Prior to Admission medications   Medication Sig Start Date End Date Taking? Authorizing Provider  acetaminophen (TYLENOL) 500 MG tablet Take 1 tablet (500 mg total) by mouth every 6 (six) hours as needed. 08/13/21   Domenic Moras, PA-C  albuterol (VENTOLIN HFA) 108 (90 Base) MCG/ACT inhaler Inhale 1-2 puffs into the lungs every 6 (six) hours as needed for wheezing or shortness of breath. 12/20/21   Veryl Speak, MD  aspirin EC 81 MG tablet Take 1 tablet (81 mg total) by mouth daily. Swallow whole. 12/20/21   Veryl Speak, MD  atorvastatin (LIPITOR) 80 MG tablet Take 1 tablet (80 mg total) by mouth daily. 12/20/21   Veryl Speak, MD  azithromycin (ZITHROMAX) 250 MG tablet Take 1 tablet (250 mg total) by mouth daily. Take first 2 tablets together, then 1 every day until finished. 01/02/22   Montine Circle, PA-C  cyclobenzaprine (FLEXERIL) 10 MG tablet Take 1 tablet (10 mg total) by mouth 2 (two) times daily as needed for muscle spasms. 12/20/21   Veryl Speak, MD  digoxin (LANOXIN) 0.125 MG tablet Take 1 tablet (0.125 mg total) by mouth daily. 12/20/21 01/19/22  Veryl Speak, MD  FLOVENT HFA 110 MCG/ACT inhaler Inhale 2 puffs into the lungs 2 (two) times daily. 12/20/21   Veryl Speak, MD  gabapentin (NEURONTIN) 400 MG capsule Take  1 capsule (400 mg total) by mouth daily as needed (nerve pain). 12/20/21   Veryl Speak, MD  HYDROcodone-acetaminophen (NORCO) 5-325 MG tablet Take 1-2 tablets by mouth every 6 (six) hours as needed. 12/20/21   Veryl Speak, MD  losartan (COZAAR) 25 MG tablet Take 1 tablet (25 mg total) by mouth daily. 12/20/21 01/19/22  Veryl Speak, MD  nitroGLYCERIN (NITROSTAT) 0.4 MG SL tablet Place 1 tablet (0.4 mg total) under the tongue every 5 (five) minutes as needed for chest pain. 10/15/21   Minus Breeding, MD  predniSONE (DELTASONE) 20 MG tablet Take 2 tablets (40 mg total) by mouth daily. 01/02/22   Montine Circle, PA-C  rivaroxaban (XARELTO) 20 MG TABS tablet Take 1 tablet (20 mg total) by mouth daily with supper. 12/20/21   Veryl Speak, MD  spironolactone (ALDACTONE) 25 MG tablet Take 0.5 tablets (12.5 mg total) by mouth daily. 12/20/21 01/19/22  Veryl Speak, MD  torsemide (DEMADEX) 20 MG tablet Take 1 tablet (20 mg total) by mouth daily. 12/20/21 01/19/22  Veryl Speak, MD      Allergies    Patient has no known allergies.    Review of Systems   Review of Systems  Physical Exam Updated Vital Signs BP 123/86   Pulse 73   Temp (!) 97.4 F (36.3 C) (Oral)   Resp (!) 21   SpO2 98%  Physical Exam Vitals and nursing note reviewed.  HENT:     Head: Normocephalic.  Eyes:     Pupils: Pupils are equal, round, and reactive to light.  Cardiovascular:     Rate and Rhythm: Regular rhythm.  Pulmonary:     Comments: Harsh breath sounds with some rales at the bases. Musculoskeletal:        General: Tenderness present.     Right lower leg: Edema present.     Left lower leg: Edema present.     Comments: Some pitting edema bilateral lower extremities.  Neurological:     Mental Status: He is alert and oriented to person, place, and time.    ED Results / Procedures / Treatments   Labs (all labs ordered are listed, but only abnormal results are displayed) Labs Reviewed  CBC WITH  DIFFERENTIAL/PLATELET - Abnormal; Notable for the following components:      Result Value   RDW 16.2 (*)    All other components within normal limits  COMPREHENSIVE METABOLIC PANEL - Abnormal; Notable for the following components:   CO2 21 (*)    BUN 26 (*)    Creatinine, Ser 1.28 (*)    Albumin 2.9 (*)    AST 112 (*)    ALT 144 (*)    All other components within normal limits  BRAIN NATRIURETIC PEPTIDE - Abnormal; Notable for the following components:   B Natriuretic Peptide 1,944.7 (*)    All other components within normal limits  TROPONIN I (HIGH SENSITIVITY) - Abnormal; Notable for the following components:   Troponin I (High Sensitivity) 38 (*)    All other components within normal limits  TROPONIN I (HIGH SENSITIVITY) - Abnormal; Notable for the following components:   Troponin I (High Sensitivity) 34 (*)    All other components within normal limits    EKG EKG Interpretation  Date/Time:  Sunday January 02 2022 23:23:32 EDT Ventricular Rate:  77 PR Interval:  234 QRS Duration: 98 QT Interval:  408 QTC Calculation: 461 R Axis:   -68 Text Interpretation: Sinus rhythm with 1st degree A-V block Left axis deviation Minimal voltage criteria for LVH, may be normal variant ( Cornell product ) Inferior infarct , age undetermined Anterior infarct , age undetermined Abnormal ECG When compared with ECG of 01-Jan-2022 20:05, PREVIOUS ECG IS PRESENT Confirmed by Merrily Pew [phone]) on 01/02/2022 11:32:44 PM  Radiology DG Chest 2 View  Result Date: 01/02/2022 CLINICAL DATA:  Dyspnea EXAM: CHEST - 2 VIEW COMPARISON:  01/01/2022 FINDINGS: Mild cardiomegaly is present, stable since prior examination. Interval development of mild bilateral perihilar interstitial pulmonary edema, most in keeping with mild cardiogenic failure. No pneumothorax or pleural effusion. No acute bone abnormality. IMPRESSION: Stable cardiomegaly. Interval development of mild cardiogenic failure. Electronically  Signed   By: Fidela Salisbury M.D.   On: 01/02/2022 23:55   DG Chest 2 View  Result Date: 01/01/2022 CLINICAL DATA:  Shortness of breath. EXAM: CHEST - 2 VIEW COMPARISON:  December 19, 2021 FINDINGS: Cardiomediastinal silhouette is enlarged. Mediastinal contours appear intact. There is no evidence of focal airspace consolidation, pleural effusion or pneumothorax. Osseous structures are without acute abnormality. Soft tissues are grossly normal. IMPRESSION: Enlarged cardiac silhouette. Further evaluation with cardiac echo may be considered as pericardial effusion is on the differential diagnosis. Electronically Signed   By: Fidela Salisbury M.D.   On: 01/01/2022 20:44    Procedures Procedures  {Document cardiac monitor, telemetry assessment procedure when appropriate:1}  Medications Ordered in ED Medications  furosemide (LASIX) injection 40 mg (40  mg Intravenous Given 01/03/22 7253)    ED Course/ Medical Decision Making/ A&P                           Medical Decision Making Risk Prescription drug management.   Patient presents with edema and shortness of breath.  History of CHF and appears to be somewhat volume overloaded.  Weight is up about 6 pounds since recent admission.  Does show volume overload on x-ray and does have BNP is elevated.  Troponin is elevated but stable and appears stay well to what it has been recently.  Also multiple aches and pains.  We will give some IV Lasix here and reevaluate  {Document critical care time when appropriate:1} {Document review of labs and clinical decision tools ie heart score, Chads2Vasc2 etc:1}  {Document your independent review of radiology images, and any outside records:1} {Document your discussion with family members, caretakers, and with consultants:1} {Document social determinants of health affecting pt's care:1} {Document your decision making why or why not admission, treatments were needed:1} Final Clinical Impression(s) / ED  Diagnoses Final diagnoses:  None    Rx / DC Orders ED Discharge Orders     None

## 2022-01-03 NOTE — ED Notes (Signed)
Pt reports increased difficulty walking, increased muscle cramping, and muscle weakness following the administration of the Robaxin. MD made aware.

## 2022-01-03 NOTE — Progress Notes (Signed)
[employer] informed CSW that patient received a rollator in July 2023 and they cannot supply a walker through insurance. Patient would have to buy his own.

## 2022-01-03 NOTE — Progress Notes (Signed)
CSW spoke with [employer] who will deliver walker to patients room

## 2022-01-03 NOTE — Discharge Instructions (Addendum)
Continue with your medications at home.  Cardiology should be contacting you for a follow-up appointment.  Continue your medicines at home and hopefully with getting the fluid off.  Should help overall.  Try and control your fluid intake. We do not have a cane that we can give you.  They can be bought at medical supply stores but insurance will not pay for 1 because you had the other walker a couple months ago.

## 2022-01-03 NOTE — ED Notes (Signed)
Discharge instructions reviewed with patient. Patient denies any questions or concerns. Patient out to lobby via wheelchair. 

## 2022-01-04 ENCOUNTER — Other Ambulatory Visit: Payer: Self-pay

## 2022-01-04 ENCOUNTER — Emergency Department (HOSPITAL_COMMUNITY)
Admission: EM | Admit: 2022-01-04 | Discharge: 2022-01-05 | Disposition: A | Discharge status: Home / Self Care | Payer: Medicaid Other | Attending: Emergency Medicine | Admitting: Emergency Medicine

## 2022-01-04 ENCOUNTER — Emergency Department (HOSPITAL_COMMUNITY): Payer: Medicaid Other

## 2022-01-04 ENCOUNTER — Encounter (HOSPITAL_COMMUNITY): Payer: Self-pay | Admitting: Emergency Medicine

## 2022-01-04 DIAGNOSIS — I5040 Unspecified combined systolic (congestive) and diastolic (congestive) heart failure: Secondary | ICD-10-CM | POA: Diagnosis not present

## 2022-01-04 DIAGNOSIS — I130 Hypertensive heart and chronic kidney disease with heart failure and stage 1 through stage 4 chronic kidney disease, or unspecified chronic kidney disease: Secondary | ICD-10-CM | POA: Insufficient documentation

## 2022-01-04 DIAGNOSIS — Z79899 Other long term (current) drug therapy: Secondary | ICD-10-CM | POA: Diagnosis not present

## 2022-01-04 DIAGNOSIS — Z7982 Long term (current) use of aspirin: Secondary | ICD-10-CM | POA: Insufficient documentation

## 2022-01-04 DIAGNOSIS — R112 Nausea with vomiting, unspecified: Secondary | ICD-10-CM | POA: Diagnosis not present

## 2022-01-04 DIAGNOSIS — R079 Chest pain, unspecified: Secondary | ICD-10-CM | POA: Diagnosis not present

## 2022-01-04 DIAGNOSIS — Z7951 Long term (current) use of inhaled steroids: Secondary | ICD-10-CM | POA: Diagnosis not present

## 2022-01-04 DIAGNOSIS — Z7901 Long term (current) use of anticoagulants: Secondary | ICD-10-CM | POA: Diagnosis not present

## 2022-01-04 DIAGNOSIS — R42 Dizziness and giddiness: Secondary | ICD-10-CM | POA: Insufficient documentation

## 2022-01-04 DIAGNOSIS — J449 Chronic obstructive pulmonary disease, unspecified: Secondary | ICD-10-CM | POA: Insufficient documentation

## 2022-01-04 DIAGNOSIS — N189 Chronic kidney disease, unspecified: Secondary | ICD-10-CM | POA: Diagnosis not present

## 2022-01-04 DIAGNOSIS — I2510 Atherosclerotic heart disease of native coronary artery without angina pectoris: Secondary | ICD-10-CM | POA: Insufficient documentation

## 2022-01-04 LAB — CBC WITH DIFFERENTIAL/PLATELET
Abs Immature Granulocytes: 0.02 10*3/uL (ref 0.00–0.07)
Basophils Absolute: 0.1 10*3/uL (ref 0.0–0.1)
Basophils Relative: 1 %
Eosinophils Absolute: 0.3 10*3/uL (ref 0.0–0.5)
Eosinophils Relative: 4 %
HCT: 45.2 % (ref 39.0–52.0)
Hemoglobin: 14 g/dL (ref 13.0–17.0)
Immature Granulocytes: 0 %
Lymphocytes Relative: 20 %
Lymphs Abs: 1.2 10*3/uL (ref 0.7–4.0)
MCH: 29.1 pg (ref 26.0–34.0)
MCHC: 31 g/dL (ref 30.0–36.0)
MCV: 94 fL (ref 80.0–100.0)
Monocytes Absolute: 0.8 10*3/uL (ref 0.1–1.0)
Monocytes Relative: 12 %
Neutro Abs: 4 10*3/uL (ref 1.7–7.7)
Neutrophils Relative %: 63 %
Platelets: 281 10*3/uL (ref 150–400)
RBC: 4.81 MIL/uL (ref 4.22–5.81)
RDW: 16.3 % — ABNORMAL HIGH (ref 11.5–15.5)
WBC: 6.4 10*3/uL (ref 4.0–10.5)
nRBC: 0 % (ref 0.0–0.2)

## 2022-01-04 LAB — COMPREHENSIVE METABOLIC PANEL
ALT: 103 U/L — ABNORMAL HIGH (ref 0–44)
AST: 57 U/L — ABNORMAL HIGH (ref 15–41)
Albumin: 2.7 g/dL — ABNORMAL LOW (ref 3.5–5.0)
Alkaline Phosphatase: 113 U/L (ref 38–126)
Anion gap: 8 (ref 5–15)
BUN: 19 mg/dL (ref 8–23)
CO2: 23 mmol/L (ref 22–32)
Calcium: 8.8 mg/dL — ABNORMAL LOW (ref 8.9–10.3)
Chloride: 107 mmol/L (ref 98–111)
Creatinine, Ser: 1.14 mg/dL (ref 0.61–1.24)
GFR, Estimated: >60 mL/min (ref >60)
Glucose, Bld: 104 mg/dL — ABNORMAL HIGH (ref 70–99)
Potassium: 4.4 mmol/L (ref 3.5–5.1)
Sodium: 138 mmol/L (ref 135–145)
Total Bilirubin: 0.9 mg/dL (ref 0.3–1.2)
Total Protein: 6.9 g/dL (ref 6.5–8.1)

## 2022-01-04 LAB — MAGNESIUM: Magnesium: 2 mg/dL (ref 1.7–2.4)

## 2022-01-04 LAB — BRAIN NATRIURETIC PEPTIDE: B Natriuretic Peptide: 1164.1 pg/mL — ABNORMAL HIGH (ref 0.0–100.0)

## 2022-01-04 LAB — TROPONIN I (HIGH SENSITIVITY): Troponin I (High Sensitivity): 28 ng/L — ABNORMAL HIGH (ref <18)

## 2022-01-04 NOTE — ED Triage Notes (Signed)
Patient here with chest pain, no shortness of breath.  Patient states that he took his nitro, which helped with the pain.  Patient states that he took 81mg  x4 ASA.

## 2022-01-04 NOTE — ED Provider Triage Note (Signed)
Emergency Medicine Provider Triage Evaluation Note  Allen Mayer , a 61 y.o. male  was evaluated in triage.  Pt complains of dizziness.  Onset last night.  Patient reports lightheadedness and feeling off balance.  Patient reports associated bilateral leg swelling, bilateral knee pain and right shoulder pain.  No trauma.  Patient reports has been over here several times for his leg swelling and we "are not doing anything about it".  Patient denies any headache or vision changes.  No history of vertigo.  He reports remote history of chest pain or shortness of breath but denies any at this time.  Did take a nitroglycerin that relieved the symptoms..  Review of Systems  Positive: Dizziness, lightheadedness, chest pain, shortness of breath, leg swelling, knee pain Negative: Fever, chills, neck pain  Physical Exam  BP 127/80 (BP Location: Right Arm)   Pulse 89   Temp 98.6 F (37 C) (Oral)   Resp 18   SpO2 98%  Gen:   Awake, no distress   Resp:  Normal effort  MSK:   Moves extremities without difficulty  Other:  Heart regular rate and rhythm.  Lungs clear to auscultation bilaterally.  The patient is alert, attentive, and oriented x 3. Speech is clear. Cranial nerve II-VII grossly intact. Negative pronator drift. Sensation intact. Strength 5/5 in all extremities.  Rapid alternating movement and fine finger movements intact.     Medical Decision Making  Medically screening exam initiated at 9:42 PM.  Appropriate orders placed.  [place] was informed that the remainder of the evaluation will be completed by another provider, this initial triage assessment does not replace that evaluation, and the importance of remaining in the ED until their evaluation is complete.  Patient presents for multiple complaints.  Does complain of dizziness.  Symptoms started last night.  No focal neurological deficit appreciated exam.  Lab work and imaging pending at this time.   Doristine Devoid,  PA-C 01/04/22 2145

## 2022-01-05 ENCOUNTER — Encounter (HOSPITAL_COMMUNITY): Payer: Self-pay | Admitting: Emergency Medicine

## 2022-01-05 ENCOUNTER — Emergency Department (HOSPITAL_COMMUNITY): Payer: Medicaid Other

## 2022-01-05 ENCOUNTER — Emergency Department (HOSPITAL_COMMUNITY)
Admission: EM | Admit: 2022-01-05 | Discharge: 2022-01-06 | Disposition: A | Discharge status: Home / Self Care | Payer: Medicaid Other | Source: Home / Self Care | Attending: Emergency Medicine | Admitting: Emergency Medicine

## 2022-01-05 DIAGNOSIS — Z7951 Long term (current) use of inhaled steroids: Secondary | ICD-10-CM | POA: Insufficient documentation

## 2022-01-05 DIAGNOSIS — N189 Chronic kidney disease, unspecified: Secondary | ICD-10-CM | POA: Insufficient documentation

## 2022-01-05 DIAGNOSIS — R110 Nausea: Secondary | ICD-10-CM

## 2022-01-05 DIAGNOSIS — Z7982 Long term (current) use of aspirin: Secondary | ICD-10-CM | POA: Insufficient documentation

## 2022-01-05 DIAGNOSIS — J449 Chronic obstructive pulmonary disease, unspecified: Secondary | ICD-10-CM | POA: Insufficient documentation

## 2022-01-05 DIAGNOSIS — R112 Nausea with vomiting, unspecified: Secondary | ICD-10-CM | POA: Insufficient documentation

## 2022-01-05 DIAGNOSIS — Z79899 Other long term (current) drug therapy: Secondary | ICD-10-CM | POA: Insufficient documentation

## 2022-01-05 DIAGNOSIS — R42 Dizziness and giddiness: Secondary | ICD-10-CM | POA: Insufficient documentation

## 2022-01-05 DIAGNOSIS — Z7901 Long term (current) use of anticoagulants: Secondary | ICD-10-CM | POA: Insufficient documentation

## 2022-01-05 LAB — URINALYSIS, ROUTINE W REFLEX MICROSCOPIC
Bilirubin Urine: NEGATIVE
Glucose, UA: NEGATIVE mg/dL
Hgb urine dipstick: NEGATIVE
Ketones, ur: NEGATIVE mg/dL
Leukocytes,Ua: NEGATIVE
Nitrite: NEGATIVE
Protein, ur: 100 mg/dL — AB
Specific Gravity, Urine: 1.016 (ref 1.005–1.030)
pH: 5 (ref 5.0–8.0)

## 2022-01-05 LAB — COMPREHENSIVE METABOLIC PANEL
ALT: 80 U/L — ABNORMAL HIGH (ref 0–44)
AST: 40 U/L (ref 15–41)
Albumin: 2.7 g/dL — ABNORMAL LOW (ref 3.5–5.0)
Alkaline Phosphatase: 99 U/L (ref 38–126)
Anion gap: 6 (ref 5–15)
BUN: 15 mg/dL (ref 8–23)
CO2: 25 mmol/L (ref 22–32)
Calcium: 8.9 mg/dL (ref 8.9–10.3)
Chloride: 108 mmol/L (ref 98–111)
Creatinine, Ser: 1.12 mg/dL (ref 0.61–1.24)
GFR, Estimated: >60 mL/min (ref >60)
Glucose, Bld: 97 mg/dL (ref 70–99)
Potassium: 4.9 mmol/L (ref 3.5–5.1)
Sodium: 139 mmol/L (ref 135–145)
Total Bilirubin: 1 mg/dL (ref 0.3–1.2)
Total Protein: 6.6 g/dL (ref 6.5–8.1)

## 2022-01-05 LAB — CBC
HCT: 44.4 % (ref 39.0–52.0)
Hemoglobin: 13.9 g/dL (ref 13.0–17.0)
MCH: 29.1 pg (ref 26.0–34.0)
MCHC: 31.3 g/dL (ref 30.0–36.0)
MCV: 92.9 fL (ref 80.0–100.0)
Platelets: 253 10*3/uL (ref 150–400)
RBC: 4.78 MIL/uL (ref 4.22–5.81)
RDW: 16.5 % — ABNORMAL HIGH (ref 11.5–15.5)
WBC: 5.6 10*3/uL (ref 4.0–10.5)
nRBC: 0 % (ref 0.0–0.2)

## 2022-01-05 LAB — LIPASE, BLOOD: Lipase: 36 U/L (ref 11–51)

## 2022-01-05 LAB — TROPONIN I (HIGH SENSITIVITY): Troponin I (High Sensitivity): 30 ng/L — ABNORMAL HIGH (ref <18)

## 2022-01-05 MED ORDER — MECLIZINE HCL 25 MG PO TABS
25.0000 mg | ORAL_TABLET | Freq: Once | ORAL | Status: AC
  Administered 2022-01-05: 25 mg via ORAL
  Filled 2022-01-05: qty 1

## 2022-01-05 NOTE — ED Notes (Signed)
Patient reports feeling better, denies dizziness.

## 2022-01-05 NOTE — ED Triage Notes (Signed)
Nausea and vomiting that started today without abdominal pain, emesis "looks like food", tolerating PO liquids. Not currently nauseated.  H/o MI, CHF, HTN, neuropathy, COPD

## 2022-01-05 NOTE — Discharge Instructions (Signed)
Continue your daily prescribed medications and follow-up with a primary care doctor.  Return for new or concerning symptoms.

## 2022-01-05 NOTE — ED Provider Notes (Signed)
[facility] EMERGENCY DEPARTMENT Provider Note   CSN: [id number] Arrival date & time: 01/04/22  2115     History {Add pertinent medical, surgical, social history, OB history to HPI:1} Chief Complaint  Patient presents with   Chest Pain    Lysander Calixte is a 61 y.o. male.   Chest Pain      Home Medications Prior to Admission medications   Medication Sig Start Date End Date Taking? Authorizing Provider  acetaminophen (TYLENOL) 500 MG tablet Take 1 tablet (500 mg total) by mouth every 6 (six) hours as needed. 08/13/21   Domenic Moras, PA-C  albuterol (VENTOLIN HFA) 108 (90 Base) MCG/ACT inhaler Inhale 1-2 puffs into the lungs every 6 (six) hours as needed for wheezing or shortness of breath. 12/20/21   Veryl Speak, MD  aspirin EC 81 MG tablet Take 1 tablet (81 mg total) by mouth daily. Swallow whole. 12/20/21   Veryl Speak, MD  atorvastatin (LIPITOR) 80 MG tablet Take 1 tablet (80 mg total) by mouth daily. 12/20/21   Veryl Speak, MD  azithromycin (ZITHROMAX) 250 MG tablet Take 1 tablet (250 mg total) by mouth daily. Take first 2 tablets together, then 1 every day until finished. 01/02/22   Montine Circle, PA-C  cyclobenzaprine (FLEXERIL) 10 MG tablet Take 1 tablet (10 mg total) by mouth 2 (two) times daily as needed for muscle spasms. 12/20/21   Veryl Speak, MD  digoxin (LANOXIN) 0.125 MG tablet Take 1 tablet (0.125 mg total) by mouth daily. 12/20/21 01/19/22  Veryl Speak, MD  FLOVENT HFA 110 MCG/ACT inhaler Inhale 2 puffs into the lungs 2 (two) times daily. 12/20/21   Veryl Speak, MD  gabapentin (NEURONTIN) 400 MG capsule Take 1 capsule (400 mg total) by mouth daily as needed (nerve pain). 12/20/21   Veryl Speak, MD  HYDROcodone-acetaminophen (NORCO) 5-325 MG tablet Take 1-2 tablets by mouth every 6 (six) hours as needed. 12/20/21   Veryl Speak, MD  losartan (COZAAR) 25 MG tablet Take 1 tablet (25 mg total) by mouth daily. 12/20/21 01/19/22  Veryl Speak,  MD  nitroGLYCERIN (NITROSTAT) 0.4 MG SL tablet Place 1 tablet (0.4 mg total) under the tongue every 5 (five) minutes as needed for chest pain. 10/15/21   Minus Breeding, MD  predniSONE (DELTASONE) 20 MG tablet Take 2 tablets (40 mg total) by mouth daily. 01/02/22   Montine Circle, PA-C  rivaroxaban (XARELTO) 20 MG TABS tablet Take 1 tablet (20 mg total) by mouth daily with supper. 12/20/21   Veryl Speak, MD  spironolactone (ALDACTONE) 25 MG tablet Take 0.5 tablets (12.5 mg total) by mouth daily. 12/20/21 01/19/22  Veryl Speak, MD  torsemide (DEMADEX) 20 MG tablet Take 1 tablet (20 mg total) by mouth daily. 12/20/21 01/19/22  Veryl Speak, MD      Allergies    Patient has no known allergies.    Review of Systems   Review of Systems  Cardiovascular:  Positive for chest pain.    Physical Exam Updated Vital Signs BP (!) 118/90   Pulse 84   Temp 98 F (36.7 C) (Oral)   Resp 15   SpO2 98%  Physical Exam  ED Results / Procedures / Treatments   Labs (all labs ordered are listed, but only abnormal results are displayed) Labs Reviewed  CBC WITH DIFFERENTIAL/PLATELET - Abnormal; Notable for the following components:      Result Value   RDW 16.3 (*)    All other components within normal limits  COMPREHENSIVE METABOLIC PANEL -  Abnormal; Notable for the following components:   Glucose, Bld 104 (*)    Calcium 8.8 (*)    Albumin 2.7 (*)    AST 57 (*)    ALT 103 (*)    All other components within normal limits  BRAIN NATRIURETIC PEPTIDE - Abnormal; Notable for the following components:   B Natriuretic Peptide 1,164.1 (*)    All other components within normal limits  TROPONIN I (HIGH SENSITIVITY) - Abnormal; Notable for the following components:   Troponin I (High Sensitivity) 28 (*)    All other components within normal limits  TROPONIN I (HIGH SENSITIVITY) - Abnormal; Notable for the following components:   Troponin I (High Sensitivity) 30 (*)    All other components within normal  limits  MAGNESIUM    EKG EKG Interpretation  Date/Time:  Tuesday January 04 2022 21:45:04 EDT Ventricular Rate:  85 PR Interval:  234 QRS Duration: 92 QT Interval:  384 QTC Calculation: 456 R Axis:   -70 Text Interpretation: Sinus rhythm with 1st degree A-V block Left axis deviation Minimal voltage criteria for LVH, may be normal variant ( Cornell product ) Inferior infarct , age undetermined Anterior infarct , age undetermined Abnormal ECG When compared with ECG of 02-Jan-2022 23:23, No significant change was found Confirmed by Dione Booze ([id number]) on 01/05/2022 12:05:40 AM  Radiology CT Head Wo Contrast  Result Date: 01/05/2022 CLINICAL DATA:  Vertigo EXAM: CT HEAD WITHOUT CONTRAST TECHNIQUE: Contiguous axial images were obtained from the base of the skull through the vertex without intravenous contrast. RADIATION DOSE REDUCTION: This exam was performed according to the departmental dose-optimization program which includes automated exposure control, adjustment of the mA and/or kV according to patient size and/or use of iterative reconstruction technique. COMPARISON:  None Available. FINDINGS: Brain: No intracranial hemorrhage, mass effect, or evidence of acute infarct. No hydrocephalus. No extra-axial fluid collection. Vascular: No hyperdense vessel or unexpected calcification. Skull: No fracture or focal lesion. Sinuses/Orbits: No acute finding. Paranasal sinuses and mastoid air cells are well aerated. Other: Circumscribed fat density lesion in the left posterior occipital scalp near the midline compatible with a benign lipoma. IMPRESSION: No acute intracranial abnormality. Electronically Signed   By: Minerva Fester M.D.   On: 01/05/2022 01:18   DG Chest 2 View  Result Date: 01/04/2022 CLINICAL DATA:  Chest pain and shortness of breath. EXAM: CHEST - 2 VIEW COMPARISON:  Chest radiograph dated 01/02/2022. FINDINGS: No focal consolidation, pleural effusion or pneumothorax. Mild  cardiomegaly with mild central vascular congestion. No acute osseous pathology. IMPRESSION: Mild cardiomegaly with mild central vascular congestion. No focal consolidation. Electronically Signed   By: Elgie Collard M.D.   On: 01/04/2022 22:30    Procedures Procedures  {Document cardiac monitor, telemetry assessment procedure when appropriate:1}  Medications Ordered in ED Medications  meclizine (ANTIVERT) tablet 25 mg (25 mg Oral Given 01/05/22 1914)    ED Course/ Medical Decision Making/ A&P                           Medical Decision Making  ***  {Document critical care time when appropriate:1} {Document review of labs and clinical decision tools ie heart score, Chads2Vasc2 etc:1}  {Document your independent review of radiology images, and any outside records:1} {Document your discussion with family members, caretakers, and with consultants:1} {Document social determinants of health affecting pt's care:1} {Document your decision making why or why not admission, treatments were needed:1} Final Clinical Impression(s) / ED  Diagnoses Final diagnoses:  Dizziness and giddiness    Rx / DC Orders ED Discharge Orders     None

## 2022-01-05 NOTE — ED Provider Notes (Signed)
[facility] EMERGENCY DEPARTMENT Provider Note   CSN: [id number] Arrival date & time: 01/04/22  2115     History  Chief Complaint  Patient presents with   Chest Pain    Allen Mayer is a 61 y.o. male.  The history is provided by the patient.  Chest Pain He has history of hypertension, chronic kidney disease, coronary artery disease, combined systolic and diastolic heart failure, COPD and comes in because of dizziness.  Dizziness started night before last but improved during the day.  He did have some dizziness last night, but now feels that he is completely back to baseline.  He initially was unable to walk because of the dizziness, but has been able to ambulate well in the emergency department.  He states it felt like he was drunk.  He denies nausea or vomiting.  He denies headache, ear pain, tinnitus, hearing loss.  He did have an episode of chest pain which resolved completely with 1 nitroglycerin.  He denies dyspnea, nausea, vomiting, diaphoresis.   Home Medications Prior to Admission medications   Medication Sig Start Date End Date Taking? Authorizing Provider  acetaminophen (TYLENOL) 500 MG tablet Take 1 tablet (500 mg total) by mouth every 6 (six) hours as needed. 08/13/21   Fayrene Helper, PA-C  albuterol (VENTOLIN HFA) 108 (90 Base) MCG/ACT inhaler Inhale 1-2 puffs into the lungs every 6 (six) hours as needed for wheezing or shortness of breath. 12/20/21   Geoffery Lyons, MD  aspirin EC 81 MG tablet Take 1 tablet (81 mg total) by mouth daily. Swallow whole. 12/20/21   Geoffery Lyons, MD  atorvastatin (LIPITOR) 80 MG tablet Take 1 tablet (80 mg total) by mouth daily. 12/20/21   Geoffery Lyons, MD  azithromycin (ZITHROMAX) 250 MG tablet Take 1 tablet (250 mg total) by mouth daily. Take first 2 tablets together, then 1 every day until finished. 01/02/22   Roxy Horseman, PA-C  cyclobenzaprine (FLEXERIL) 10 MG tablet Take 1 tablet (10 mg total) by mouth 2 (two) times  daily as needed for muscle spasms. 12/20/21   Geoffery Lyons, MD  digoxin (LANOXIN) 0.125 MG tablet Take 1 tablet (0.125 mg total) by mouth daily. 12/20/21 01/19/22  Geoffery Lyons, MD  FLOVENT HFA 110 MCG/ACT inhaler Inhale 2 puffs into the lungs 2 (two) times daily. 12/20/21   Geoffery Lyons, MD  gabapentin (NEURONTIN) 400 MG capsule Take 1 capsule (400 mg total) by mouth daily as needed (nerve pain). 12/20/21   Geoffery Lyons, MD  HYDROcodone-acetaminophen (NORCO) 5-325 MG tablet Take 1-2 tablets by mouth every 6 (six) hours as needed. 12/20/21   Geoffery Lyons, MD  losartan (COZAAR) 25 MG tablet Take 1 tablet (25 mg total) by mouth daily. 12/20/21 01/19/22  Geoffery Lyons, MD  nitroGLYCERIN (NITROSTAT) 0.4 MG SL tablet Place 1 tablet (0.4 mg total) under the tongue every 5 (five) minutes as needed for chest pain. 10/15/21   Rollene Rotunda, MD  predniSONE (DELTASONE) 20 MG tablet Take 2 tablets (40 mg total) by mouth daily. 01/02/22   Roxy Horseman, PA-C  rivaroxaban (XARELTO) 20 MG TABS tablet Take 1 tablet (20 mg total) by mouth daily with supper. 12/20/21   Geoffery Lyons, MD  spironolactone (ALDACTONE) 25 MG tablet Take 0.5 tablets (12.5 mg total) by mouth daily. 12/20/21 01/19/22  Geoffery Lyons, MD  torsemide (DEMADEX) 20 MG tablet Take 1 tablet (20 mg total) by mouth daily. 12/20/21 01/19/22  Geoffery Lyons, MD      Allergies  Patient has no known allergies.    Review of Systems   Review of Systems  Cardiovascular:  Positive for chest pain.  All other systems reviewed and are negative.   Physical Exam Updated Vital Signs BP (!) 114/95   Pulse 84   Temp 98.1 F (36.7 C) (Oral)   Resp 16   SpO2 98%  Physical Exam Vitals and nursing note reviewed.   61 year old male, resting comfortably and in no acute distress. Vital signs are significant for borderline elevated diastolic blood pressure. Oxygen saturation is 98%, which is normal. Head is normocephalic and atraumatic. PERRLA, EOMI.  Oropharynx is clear.  There is no nystagmus. Neck is nontender and supple without adenopathy or JVD.  There are no carotid bruits. Back is nontender and there is no CVA tenderness. Lungs are clear without rales, wheezes, or rhonchi. Chest is nontender. Heart has regular rate and rhythm without murmur. Abdomen is soft, flat, nontender. Extremities have no cyanosis or edema, full range of motion is present. Skin is warm and dry without rash. Neurologic: Mental status is normal, cranial nerves are intact.  Dizziness is reproduced by passive head movement.  Moves all extremities equally.  ED Results / Procedures / Treatments   Labs (all labs ordered are listed, but only abnormal results are displayed) Labs Reviewed  CBC WITH DIFFERENTIAL/PLATELET - Abnormal; Notable for the following components:      Result Value   RDW 16.3 (*)    All other components within normal limits  COMPREHENSIVE METABOLIC PANEL - Abnormal; Notable for the following components:   Glucose, Bld 104 (*)    Calcium 8.8 (*)    Albumin 2.7 (*)    AST 57 (*)    ALT 103 (*)    All other components within normal limits  BRAIN NATRIURETIC PEPTIDE - Abnormal; Notable for the following components:   B Natriuretic Peptide 1,164.1 (*)    All other components within normal limits  TROPONIN I (HIGH SENSITIVITY) - Abnormal; Notable for the following components:   Troponin I (High Sensitivity) 28 (*)    All other components within normal limits  TROPONIN I (HIGH SENSITIVITY) - Abnormal; Notable for the following components:   Troponin I (High Sensitivity) 30 (*)    All other components within normal limits  MAGNESIUM    EKG EKG Interpretation  Date/Time:  Tuesday January 04 2022 21:45:04 EDT Ventricular Rate:  85 PR Interval:  234 QRS Duration: 92 QT Interval:  384 QTC Calculation: 456 R Axis:   -70 Text Interpretation: Sinus rhythm with 1st degree A-V block Left axis deviation Minimal voltage criteria for LVH, may  be normal variant ( Cornell product ) Inferior infarct , age undetermined Anterior infarct , age undetermined Abnormal ECG When compared with ECG of 02-Jan-2022 23:23, No significant change was found Confirmed by Dione Booze ([id number]) on 01/05/2022 12:05:40 AM  Radiology CT Head Wo Contrast  Result Date: 01/05/2022 CLINICAL DATA:  Vertigo EXAM: CT HEAD WITHOUT CONTRAST TECHNIQUE: Contiguous axial images were obtained from the base of the skull through the vertex without intravenous contrast. RADIATION DOSE REDUCTION: This exam was performed according to the departmental dose-optimization program which includes automated exposure control, adjustment of the mA and/or kV according to patient size and/or use of iterative reconstruction technique. COMPARISON:  None Available. FINDINGS: Brain: No intracranial hemorrhage, mass effect, or evidence of acute infarct. No hydrocephalus. No extra-axial fluid collection. Vascular: No hyperdense vessel or unexpected calcification. Skull: No fracture or focal lesion. Sinuses/Orbits:  No acute finding. Paranasal sinuses and mastoid air cells are well aerated. Other: Circumscribed fat density lesion in the left posterior occipital scalp near the midline compatible with a benign lipoma. IMPRESSION: No acute intracranial abnormality. Electronically Signed   By: Placido Sou M.D.   On: 01/05/2022 01:18   DG Chest 2 View  Result Date: 01/04/2022 CLINICAL DATA:  Chest pain and shortness of breath. EXAM: CHEST - 2 VIEW COMPARISON:  Chest radiograph dated 01/02/2022. FINDINGS: No focal consolidation, pleural effusion or pneumothorax. Mild cardiomegaly with mild central vascular congestion. No acute osseous pathology. IMPRESSION: Mild cardiomegaly with mild central vascular congestion. No focal consolidation. Electronically Signed   By: Anner Crete M.D.   On: 01/04/2022 22:30    Procedures Procedures    Medications Ordered in ED Medications  meclizine (ANTIVERT) tablet  25 mg (has no administration in time range)    ED Course/ Medical Decision Making/ A&P                           Medical Decision Making  Dizziness which seems to be peripheral vertigo based on symptoms reproduced by passive head movement.  No ongoing symptoms that would suggest a stroke.  I have reviewed and interpreted the electrocardiogram, and my interpretation is first-degree AV block, left ventricular hypertrophy unchanged from prior.  Chest x-ray shows cardiomegaly.  CT of head shows no acute intracranial abnormality.  I have independently viewed all of the images, and agree with radiologist's interpretation.  At this point, with symptoms having resolved I do not feel MRI is indicated.  I have reviewed and interpreted laboratory tests, and my interpretation is mild elevation of transaminases which is chronic, moderate hypoalbuminemia which is also chronic, normal CBC.  I have ordered a dose of meclizine and will have him ambulate in the emergency department.  He ambulated in the emergency department with any residual dizziness and is discharged.  I have sent a prescription for meclizine to his pharmacy.  Final Clinical Impression(s) / ED Diagnoses Final diagnoses:  Dizziness and giddiness    Rx / DC Orders ED Discharge Orders     None         Delora Fuel, MD 53/64/68 [phone]

## 2022-01-06 ENCOUNTER — Emergency Department (HOSPITAL_COMMUNITY): Payer: Medicaid Other

## 2022-01-06 LAB — TROPONIN I (HIGH SENSITIVITY): Troponin I (High Sensitivity): 33 ng/L — ABNORMAL HIGH (ref <18)

## 2022-01-06 MED ORDER — ONDANSETRON 4 MG PO TBDP: 4.0000 mg | ORAL_TABLET | Freq: Three times a day (TID) | ORAL | 0 refills | Status: DC | PRN

## 2022-01-06 MED ORDER — ONDANSETRON HCL 4 MG/2ML IJ SOLN
4.0000 mg | Freq: Once | INTRAMUSCULAR | Status: AC
  Administered 2022-01-06: 4 mg via INTRAVENOUS
  Filled 2022-01-06: qty 2

## 2022-01-06 MED ORDER — IOHEXOL 350 MG/ML SOLN
75.0000 mL | Freq: Once | INTRAVENOUS | Status: AC | PRN
  Administered 2022-01-06: 75 mL via INTRAVENOUS
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
## 2022-01-06 NOTE — Discharge Instructions (Signed)
Today you were seen in the emergency department for your nausea and vomiting.    In the emergency department you had labs and CT scan that were reassuring.    At home, please take the Zofran that was prescribed to you as needed for any vomiting that you may have.    Follow-up with your primary doctor in 2-3 days regarding your visit and your dizziness and vomiting.  Follow-up with sports medicine for your knee pain.    Return immediately to the emergency department if you experience any of the following: Fainting, chest pain, shortness of breath, or any other concerning symptoms.    Thank you for visiting our Emergency Department. It was a pleasure taking care of you today.

## 2022-01-08 DIAGNOSIS — R000 Tachycardia, unspecified: Secondary | ICD-10-CM | POA: Insufficient documentation

## 2022-01-08 NOTE — Progress Notes (Deleted)
Cardiology Office Note   Date:  01/08/2022   ID:  Allen Mayer, DOB Nov 05, 1960, MRN [id number]  PCP:  Pcp, No  Cardiologist:   None   No chief complaint on file.     History of Present Illness: Allen Mayer is a 61 y.o. male who presents for follow up of acute systolic HF and LV thrombus.  Of note he had prior cardiac catheterization in 2019 that demonstrated no coronary artery disease.  He has a nonischemic cardiomyopathy.  He did have runs of nonsustained ventricular tachycardia in the hospital.  Of note he has a history of cocaine use with previous positive tox screen recently.  We are cautiously using carvedilol rather than metoprolol.  Since he was last seen ***.   ***  Overall I think he is doing relatively well.  He is not short of breath as he was when he came to the hospital although he still does have some dyspnea with exertion.  He says he has had some mild nosebleeds.  He has not noticed any GI bleeding.  I think he has been compliant with his cardiac medications.  He has a cough productive of some white phlegm occasionally but has not had any fevers or chills.  He has had no new PND or orthopnea.  He said no new lower extremity swelling.   Past Medical History:  Diagnosis Date   Acid reflux    Alcohol abuse    Arthritis    Asthma    Back pain    Bronchitis    Chest pain 07/31/2021   CHF (congestive heart failure) (HCC)    CKD (chronic kidney disease)    CKD (chronic kidney disease), stage III (HCC)    Cocaine abuse ([place])    COPD (chronic obstructive pulmonary disease) ([place])    History of noncompliance with medical treatment, presenting hazards to health    Homelessness    Hypertension    LV (left ventricular) mural thrombus    Myocardial infarction ([place])    Neuropathic pain    NICM (nonischemic cardiomyopathy) ([place]) 2019   NSTEMI (non-ST elevated myocardial infarction) ([place]) 07/31/2021   NSVT (nonsustained ventricular tachycardia) (HCC)     Pericardial effusion    Pulmonary hypertension ([place])    RVF (right ventricular failure) ([place])    Schizo affective schizophrenia ([place])     Past Surgical History:  Procedure Laterality Date   I & D EXTREMITY Bilateral 12/14/2020   Procedure: IRRIGATION AND DEBRIDEMENT AND CLOSURE OF LACERTATIONS OF BILATEAL ARMS;  Surgeon: Dwan Bolt, MD;  Location: [facility];  Service: General;  Laterality: Bilateral;   LAPAROTOMY N/A 12/14/2020   Procedure: EXPLORATORY LAPAROTOMY;  Surgeon: Dwan Bolt, MD;  Location: [facility];  Service: General;  Laterality: N/A;   None     RIGHT HEART CATH AND CORONARY ANGIOGRAPHY N/A 08/02/2021   Procedure: RIGHT HEART CATH AND CORONARY ANGIOGRAPHY;  Surgeon: Nelva Bush, MD;  Location: [facility];  Service: Cardiovascular;  Laterality: N/A;   RIGHT/LEFT HEART CATH AND CORONARY ANGIOGRAPHY N/A 12/13/2017   Procedure: RIGHT/LEFT HEART CATH AND CORONARY ANGIOGRAPHY;  Surgeon: Lorretta Harp, MD;  Location: [facility];  Service: Cardiovascular;  Laterality: N/A;     Current Outpatient Medications  Medication Sig Dispense Refill   acetaminophen (TYLENOL) 500 MG tablet Take 1 tablet (500 mg total) by mouth every 6 (six) hours as needed. 30 tablet 0   albuterol (VENTOLIN HFA) 108 (90 Base) MCG/ACT inhaler Inhale  1-2 puffs into the lungs every 6 (six) hours as needed for wheezing or shortness of breath. 18 g 0   aspirin EC 81 MG tablet Take 1 tablet (81 mg total) by mouth daily. Swallow whole. 30 tablet 11   atorvastatin (LIPITOR) 80 MG tablet Take 1 tablet (80 mg total) by mouth daily. 30 tablet 11   azithromycin (ZITHROMAX) 250 MG tablet Take 1 tablet (250 mg total) by mouth daily. Take first 2 tablets together, then 1 every day until finished. 6 tablet 0   cyclobenzaprine (FLEXERIL) 10 MG tablet Take 1 tablet (10 mg total) by mouth 2 (two) times daily as needed for muscle spasms. 20 tablet 0   digoxin (LANOXIN) 0.125 MG tablet Take 1 tablet (0.125 mg  total) by mouth daily. 30 tablet 0   FLOVENT HFA 110 MCG/ACT inhaler Inhale 2 puffs into the lungs 2 (two) times daily. 1 each 1   gabapentin (NEURONTIN) 400 MG capsule Take 1 capsule (400 mg total) by mouth daily as needed (nerve pain). 30 capsule 1   HYDROcodone-acetaminophen (NORCO) 5-325 MG tablet Take 1-2 tablets by mouth every 6 (six) hours as needed. 12 tablet 0   losartan (COZAAR) 25 MG tablet Take 1 tablet (25 mg total) by mouth daily. 30 tablet 0   nitroGLYCERIN (NITROSTAT) 0.4 MG SL tablet Place 1 tablet (0.4 mg total) under the tongue every 5 (five) minutes as needed for chest pain. 25 tablet 1   ondansetron (ZOFRAN-ODT) 4 MG disintegrating tablet Take 1 tablet (4 mg total) by mouth every 8 (eight) hours as needed for nausea or vomiting. 12 tablet 0   predniSONE (DELTASONE) 20 MG tablet Take 2 tablets (40 mg total) by mouth daily. 10 tablet 0   rivaroxaban (XARELTO) 20 MG TABS tablet Take 1 tablet (20 mg total) by mouth daily with supper. 30 tablet 0   spironolactone (ALDACTONE) 25 MG tablet Take 0.5 tablets (12.5 mg total) by mouth daily. 15 tablet 0   torsemide (DEMADEX) 20 MG tablet Take 1 tablet (20 mg total) by mouth daily. 30 tablet 0   No current facility-administered medications for this visit.    Allergies:   Patient has no known allergies.    ROS:  Please see the history of present illness.   Otherwise, review of systems are positive for ***.   All other systems are reviewed and negative.    PHYSICAL EXAM: VS:  There were no vitals taken for this visit. , BMI There is no height or weight on file to calculate BMI. GENERAL:  Well appearing NECK:  No jugular venous distention, waveform within normal limits, carotid upstroke brisk and symmetric, no bruits, no thyromegaly LUNGS:  Clear to auscultation bilaterally CHEST:  Unremarkable HEART:  PMI not displaced or sustained,S1 and S2 within normal limits, no S3, no S4, no clicks, no rubs, *** murmurs ABD:  Flat, positive  bowel sounds normal in frequency in pitch, no bruits, no rebound, no guarding, no midline pulsatile mass, no hepatomegaly, no splenomegaly EXT:  2 plus pulses throughout, no edema, no cyanosis no clubbing    ***GENERAL:  Well appearing NECK:  No jugular venous distention, waveform within normal limits, carotid upstroke brisk and symmetric, no bruits, no thyromegaly LUNGS:  Clear to auscultation bilaterally CHEST:  Unremarkable HEART:  PMI not displaced or sustained,S1 and S2 within normal limits, no S3, no S4, no clicks, no rubs, no murmurs ABD:  Flat, positive bowel sounds normal in frequency in pitch, no bruits, no rebound,  no guarding, no midline pulsatile mass, no hepatomegaly, no splenomegaly EXT:  2 plus pulses throughout, no edema, no cyanosis no clubbing   EKG:  EKG is *** ordered today. ***   Recent Labs: 11/02/2021: TSH 1.371 01/04/2022: B Natriuretic Peptide 1,164.1; Magnesium 2.0 01/05/2022: ALT 80; BUN 15; Creatinine, Ser 1.12; Hemoglobin 13.9; Platelets 253; Potassium 4.9; Sodium 139    Lipid Panel    Component Value Date/Time   CHOL 106 07/31/2021 0810   TRIG 45 07/31/2021 0810   HDL 28 (L) 07/31/2021 0810   CHOLHDL 3.8 07/31/2021 0810   VLDL 9 07/31/2021 0810   LDLCALC 69 07/31/2021 0810      Wt Readings from Last 3 Encounters:  01/05/22 178 lb 9.2 oz (81 kg)  01/02/22 180 lb (81.6 kg)  12/19/21 180 lb (81.6 kg)      Other studies Reviewed: Additional studies/ records that were reviewed today include:  *** Review of the above records demonstrates:  Please see elsewhere in the note.     ASSESSMENT AND PLAN:   Acute on chronic combined systolic and diastolic CHF ***  He does have a cough that does not seem to be overtly volume overloaded.  I am going to check a BNP level.  I am going to titrate his medications by increasing his carvedilol to 6.25 mg twice a day and increase his Cozaar to 50 mg daily.   LV thrombus ***  I discussed with him LV thrombus  and is getting his Coumadin checked today.  He will continue with anticoagulation probably for about 3 months with a follow-up echo.  tient follow-up recommendations as stated above.   Sinus tachycardia bradyarrhythmia. ***   I think he will tolerate the change in medications and will watch his heart rate.   COPD ***  He can continue with as needed bronchodilators.    Essential hypertension ***  This is being managed in the context of treating his CHF  Polysubstance abuse ***  He says he is not using drugs.  We talked about cigarettes as below.  Tobacco abuse.   ***  He has been educated about the need to stop smoking completely.   Chronic pain syndrome ***  Per his primary team.   Current medicines are reviewed at length with the patient today.  The patient does not have concerns regarding medicines.  The following changes have been made:  ***  Labs/ tests ordered today include:   No orders of the defined types were placed in this encounter.    Disposition:   FU with ***   Signed, Minus Breeding, MD  01/08/2022 1:59 PM    [facility]

## 2022-01-10 ENCOUNTER — Ambulatory Visit: Payer: Medicaid Other | Admitting: Cardiology

## 2022-01-10 DIAGNOSIS — I10 Essential (primary) hypertension: Secondary | ICD-10-CM

## 2022-01-10 DIAGNOSIS — Z720 Tobacco use: Secondary | ICD-10-CM

## 2022-01-10 DIAGNOSIS — R000 Tachycardia, unspecified: Secondary | ICD-10-CM

## 2022-01-10 DIAGNOSIS — I5023 Acute on chronic systolic (congestive) heart failure: Secondary | ICD-10-CM

## 2022-01-12 ENCOUNTER — Emergency Department (HOSPITAL_COMMUNITY): Payer: Medicaid Other

## 2022-01-12 ENCOUNTER — Emergency Department (HOSPITAL_COMMUNITY)
Admission: EM | Admit: 2022-01-12 | Discharge: 2022-01-13 | Discharge status: Against Medical Advice | Payer: Medicaid Other | Attending: Physician Assistant | Admitting: Physician Assistant

## 2022-01-12 ENCOUNTER — Encounter (HOSPITAL_COMMUNITY): Payer: Self-pay | Admitting: Emergency Medicine

## 2022-01-12 ENCOUNTER — Other Ambulatory Visit: Payer: Self-pay

## 2022-01-12 DIAGNOSIS — R1110 Vomiting, unspecified: Secondary | ICD-10-CM | POA: Insufficient documentation

## 2022-01-12 DIAGNOSIS — I509 Heart failure, unspecified: Secondary | ICD-10-CM | POA: Insufficient documentation

## 2022-01-12 DIAGNOSIS — R600 Localized edema: Secondary | ICD-10-CM | POA: Insufficient documentation

## 2022-01-12 DIAGNOSIS — R0602 Shortness of breath: Secondary | ICD-10-CM | POA: Diagnosis not present

## 2022-01-12 DIAGNOSIS — Z5321 Procedure and treatment not carried out due to patient leaving prior to being seen by health care provider: Secondary | ICD-10-CM | POA: Diagnosis not present

## 2022-01-12 LAB — CBC WITH DIFFERENTIAL/PLATELET
Abs Immature Granulocytes: 0.01 10*3/uL (ref 0.00–0.07)
Basophils Absolute: 0.1 10*3/uL (ref 0.0–0.1)
Basophils Relative: 1 %
Eosinophils Absolute: 0.5 10*3/uL (ref 0.0–0.5)
Eosinophils Relative: 10 %
HCT: 41.2 % (ref 39.0–52.0)
Hemoglobin: 13.1 g/dL (ref 13.0–17.0)
Immature Granulocytes: 0 %
Lymphocytes Relative: 26 %
Lymphs Abs: 1.3 10*3/uL (ref 0.7–4.0)
MCH: 29.4 pg (ref 26.0–34.0)
MCHC: 31.8 g/dL (ref 30.0–36.0)
MCV: 92.4 fL (ref 80.0–100.0)
Monocytes Absolute: 0.5 10*3/uL (ref 0.1–1.0)
Monocytes Relative: 11 %
Neutro Abs: 2.5 10*3/uL (ref 1.7–7.7)
Neutrophils Relative %: 52 %
Platelets: 251 10*3/uL (ref 150–400)
RBC: 4.46 MIL/uL (ref 4.22–5.81)
RDW: 16.2 % — ABNORMAL HIGH (ref 11.5–15.5)
WBC: 4.9 10*3/uL (ref 4.0–10.5)
nRBC: 0 % (ref 0.0–0.2)

## 2022-01-12 LAB — TROPONIN I (HIGH SENSITIVITY): Troponin I (High Sensitivity): 42 ng/L — ABNORMAL HIGH (ref <18)

## 2022-01-12 LAB — COMPREHENSIVE METABOLIC PANEL
ALT: 43 U/L (ref 0–44)
AST: 36 U/L (ref 15–41)
Albumin: 2.8 g/dL — ABNORMAL LOW (ref 3.5–5.0)
Alkaline Phosphatase: 105 U/L (ref 38–126)
Anion gap: 8 (ref 5–15)
BUN: 17 mg/dL (ref 8–23)
CO2: 27 mmol/L (ref 22–32)
Calcium: 8.7 mg/dL — ABNORMAL LOW (ref 8.9–10.3)
Chloride: 105 mmol/L (ref 98–111)
Creatinine, Ser: 1.36 mg/dL — ABNORMAL HIGH (ref 0.61–1.24)
GFR, Estimated: 59 mL/min — ABNORMAL LOW (ref >60)
Glucose, Bld: 92 mg/dL (ref 70–99)
Potassium: 3.9 mmol/L (ref 3.5–5.1)
Sodium: 140 mmol/L (ref 135–145)
Total Bilirubin: 0.9 mg/dL (ref 0.3–1.2)
Total Protein: 6.6 g/dL (ref 6.5–8.1)

## 2022-01-12 LAB — BRAIN NATRIURETIC PEPTIDE: B Natriuretic Peptide: 1920.9 pg/mL — ABNORMAL HIGH (ref 0.0–100.0)

## 2022-01-12 LAB — LIPASE, BLOOD: Lipase: 34 U/L (ref 11–51)

## 2022-01-12 NOTE — ED Triage Notes (Signed)
Patient reports generalized abdominal pain with hematemesis onset this week with mild SOB .

## 2022-01-12 NOTE — ED Provider Triage Note (Signed)
Emergency Medicine Provider Triage Evaluation Note  Allen Mayer , a 61 y.o. male  was evaluated in triage.  Pt complains of multiple complaints.  He feels like his abdomen has been more bloated and he has had additional lower extremity swelling and shortness of breath.  History of heart failure.  He also noted he has been vomiting who initially was dark blood and now is bright red. "Like watermelon"  He has a history of EtOH use.  denies any melena or bright blood per rectum.  States he is supposed to be taking a blood thinner however is not currently taking this.  Review of Systems  Positive: SOB, cough, emesis, abd pain, le edema Negative: Fever, HA  Physical Exam  There were no vitals taken for this visit. Gen:   Awake, no distress   Resp:  Normal effort  MSK:   Moves extremities without difficulty  Other:    Medical Decision Making  Medically screening exam initiated at 8:53 PM.  Appropriate orders placed.  Brelan Arrighi was informed that the remainder of the evaluation will be completed by another provider, this initial triage assessment does not replace that evaluation, and the importance of remaining in the ED until their evaluation is complete.  SOB, le edema, emesis, abd pain   Francheska Villeda A, PA-C 01/12/22 2055

## 2022-01-13 ENCOUNTER — Other Ambulatory Visit: Payer: Self-pay

## 2022-01-13 ENCOUNTER — Encounter (HOSPITAL_COMMUNITY): Payer: Self-pay | Admitting: Emergency Medicine

## 2022-01-13 ENCOUNTER — Inpatient Hospital Stay (HOSPITAL_COMMUNITY)
Admission: EM | Admit: 2022-01-13 | Discharge: 2022-01-17 | DRG: 291 | Disposition: A | Discharge status: Home / Self Care | Payer: Medicaid Other | Attending: Internal Medicine | Admitting: Internal Medicine

## 2022-01-13 DIAGNOSIS — I5082 Biventricular heart failure: Secondary | ICD-10-CM | POA: Diagnosis present

## 2022-01-13 DIAGNOSIS — I5043 Acute on chronic combined systolic (congestive) and diastolic (congestive) heart failure: Secondary | ICD-10-CM | POA: Diagnosis present

## 2022-01-13 DIAGNOSIS — I440 Atrioventricular block, first degree: Secondary | ICD-10-CM | POA: Diagnosis present

## 2022-01-13 DIAGNOSIS — Z8249 Family history of ischemic heart disease and other diseases of the circulatory system: Secondary | ICD-10-CM

## 2022-01-13 DIAGNOSIS — I428 Other cardiomyopathies: Secondary | ICD-10-CM | POA: Diagnosis present

## 2022-01-13 DIAGNOSIS — F259 Schizoaffective disorder, unspecified: Secondary | ICD-10-CM | POA: Diagnosis present

## 2022-01-13 DIAGNOSIS — M792 Neuralgia and neuritis, unspecified: Secondary | ICD-10-CM | POA: Diagnosis present

## 2022-01-13 DIAGNOSIS — F17210 Nicotine dependence, cigarettes, uncomplicated: Secondary | ICD-10-CM | POA: Diagnosis present

## 2022-01-13 DIAGNOSIS — Z7982 Long term (current) use of aspirin: Secondary | ICD-10-CM

## 2022-01-13 DIAGNOSIS — I441 Atrioventricular block, second degree: Secondary | ICD-10-CM | POA: Diagnosis present

## 2022-01-13 DIAGNOSIS — Z7901 Long term (current) use of anticoagulants: Secondary | ICD-10-CM

## 2022-01-13 DIAGNOSIS — Z91148 Patient's other noncompliance with medication regimen for other reason: Secondary | ICD-10-CM

## 2022-01-13 DIAGNOSIS — I10 Essential (primary) hypertension: Secondary | ICD-10-CM | POA: Diagnosis present

## 2022-01-13 DIAGNOSIS — Z720 Tobacco use: Secondary | ICD-10-CM | POA: Diagnosis present

## 2022-01-13 DIAGNOSIS — I513 Intracardiac thrombosis, not elsewhere classified: Secondary | ICD-10-CM | POA: Diagnosis present

## 2022-01-13 DIAGNOSIS — I5020 Unspecified systolic (congestive) heart failure: Secondary | ICD-10-CM | POA: Diagnosis present

## 2022-01-13 DIAGNOSIS — Z888 Allergy status to other drugs, medicaments and biological substances status: Secondary | ICD-10-CM

## 2022-01-13 DIAGNOSIS — Z5901 Sheltered homelessness: Secondary | ICD-10-CM

## 2022-01-13 DIAGNOSIS — I130 Hypertensive heart and chronic kidney disease with heart failure and stage 1 through stage 4 chronic kidney disease, or unspecified chronic kidney disease: Principal | ICD-10-CM | POA: Diagnosis present

## 2022-01-13 DIAGNOSIS — Z7952 Long term (current) use of systemic steroids: Secondary | ICD-10-CM

## 2022-01-13 DIAGNOSIS — I252 Old myocardial infarction: Secondary | ICD-10-CM

## 2022-01-13 DIAGNOSIS — Z1152 Encounter for screening for COVID-19: Secondary | ICD-10-CM

## 2022-01-13 DIAGNOSIS — J449 Chronic obstructive pulmonary disease, unspecified: Secondary | ICD-10-CM | POA: Diagnosis present

## 2022-01-13 DIAGNOSIS — Z79899 Other long term (current) drug therapy: Secondary | ICD-10-CM

## 2022-01-13 DIAGNOSIS — M1990 Unspecified osteoarthritis, unspecified site: Secondary | ICD-10-CM | POA: Diagnosis present

## 2022-01-13 DIAGNOSIS — J441 Chronic obstructive pulmonary disease with (acute) exacerbation: Secondary | ICD-10-CM | POA: Diagnosis present

## 2022-01-13 DIAGNOSIS — I2729 Other secondary pulmonary hypertension: Secondary | ICD-10-CM | POA: Diagnosis present

## 2022-01-13 DIAGNOSIS — F1410 Cocaine abuse, uncomplicated: Secondary | ICD-10-CM | POA: Diagnosis present

## 2022-01-13 DIAGNOSIS — N183 Chronic kidney disease, stage 3 unspecified: Secondary | ICD-10-CM | POA: Diagnosis present

## 2022-01-13 DIAGNOSIS — I4510 Unspecified right bundle-branch block: Secondary | ICD-10-CM | POA: Diagnosis present

## 2022-01-13 NOTE — ED Notes (Signed)
Called pt;s name for vitals and got no response 

## 2022-01-13 NOTE — ED Provider Triage Note (Signed)
Emergency Medicine Provider Triage Evaluation Note  Allen Mayer , a 61 y.o. male  was evaluated in triage.  Pt complains of chest pain.  States he feels it in center of chest, described as "pinching".  He reports some SOB with this.  Denies cough/fever.  Also reports "feels like he is getting weak".  Hx CHF.  Review of Systems  Positive: Chest pain, SOB Negative: fever  Physical Exam  BP (!) 133/95 (BP Location: Right Arm)   Pulse 86   Temp 97.7 F (36.5 C) (Oral)   Resp 18   SpO2 96%  Gen:   Awake, no distress   Resp:  Normal effort  MSK:   Moves extremities without difficulty  Other:    Medical Decision Making  Medically screening exam initiated at 11:49 PM.  Appropriate orders placed.  Allen Mayer was informed that the remainder of the evaluation will be completed by another provider, this initial triage assessment does not replace that evaluation, and the importance of remaining in the ED until their evaluation is complete.  Chest pain, SOB.  Hx CHF.  EKG, labs, CXR.   Allen Pickett, PA-C 01/14/22 [phone]

## 2022-01-13 NOTE — ED Notes (Signed)
Call pt name for second troponin blood draw, no answer

## 2022-01-13 NOTE — ED Triage Notes (Signed)
Pt reports substernal chest pinching pain and dizziness.  "My hands, mouth, face feels numb."  Pt reports that he has not had any medications in three days b/c "the motel through all my meds away."

## 2022-01-13 NOTE — ED Notes (Signed)
PT left AMA 

## 2022-01-13 NOTE — ED Notes (Signed)
Patient came up to RN states he hasn't heard his name called , patient had been called x 2 with no answer , pulled back on the floor.

## 2022-01-14 ENCOUNTER — Emergency Department (HOSPITAL_COMMUNITY): Payer: Medicaid Other

## 2022-01-14 ENCOUNTER — Encounter (HOSPITAL_COMMUNITY): Payer: Self-pay | Admitting: Internal Medicine

## 2022-01-14 ENCOUNTER — Other Ambulatory Visit (HOSPITAL_COMMUNITY): Payer: Self-pay

## 2022-01-14 DIAGNOSIS — Z8249 Family history of ischemic heart disease and other diseases of the circulatory system: Secondary | ICD-10-CM | POA: Diagnosis not present

## 2022-01-14 DIAGNOSIS — J441 Chronic obstructive pulmonary disease with (acute) exacerbation: Secondary | ICD-10-CM | POA: Diagnosis present

## 2022-01-14 DIAGNOSIS — I440 Atrioventricular block, first degree: Secondary | ICD-10-CM | POA: Diagnosis present

## 2022-01-14 DIAGNOSIS — I2729 Other secondary pulmonary hypertension: Secondary | ICD-10-CM | POA: Diagnosis present

## 2022-01-14 DIAGNOSIS — I252 Old myocardial infarction: Secondary | ICD-10-CM | POA: Diagnosis not present

## 2022-01-14 DIAGNOSIS — I441 Atrioventricular block, second degree: Secondary | ICD-10-CM | POA: Diagnosis present

## 2022-01-14 DIAGNOSIS — Z7952 Long term (current) use of systemic steroids: Secondary | ICD-10-CM | POA: Diagnosis not present

## 2022-01-14 DIAGNOSIS — I5020 Unspecified systolic (congestive) heart failure: Secondary | ICD-10-CM | POA: Diagnosis present

## 2022-01-14 DIAGNOSIS — N183 Chronic kidney disease, stage 3 unspecified: Secondary | ICD-10-CM | POA: Diagnosis present

## 2022-01-14 DIAGNOSIS — Z7982 Long term (current) use of aspirin: Secondary | ICD-10-CM | POA: Diagnosis not present

## 2022-01-14 DIAGNOSIS — F1410 Cocaine abuse, uncomplicated: Secondary | ICD-10-CM | POA: Diagnosis present

## 2022-01-14 DIAGNOSIS — I5082 Biventricular heart failure: Secondary | ICD-10-CM | POA: Diagnosis present

## 2022-01-14 DIAGNOSIS — Z79899 Other long term (current) drug therapy: Secondary | ICD-10-CM | POA: Diagnosis not present

## 2022-01-14 DIAGNOSIS — F17210 Nicotine dependence, cigarettes, uncomplicated: Secondary | ICD-10-CM | POA: Diagnosis present

## 2022-01-14 DIAGNOSIS — I513 Intracardiac thrombosis, not elsewhere classified: Secondary | ICD-10-CM | POA: Diagnosis present

## 2022-01-14 DIAGNOSIS — Z91148 Patient's other noncompliance with medication regimen for other reason: Secondary | ICD-10-CM | POA: Diagnosis not present

## 2022-01-14 DIAGNOSIS — I5043 Acute on chronic combined systolic (congestive) and diastolic (congestive) heart failure: Secondary | ICD-10-CM | POA: Diagnosis present

## 2022-01-14 DIAGNOSIS — Z1152 Encounter for screening for COVID-19: Secondary | ICD-10-CM | POA: Diagnosis not present

## 2022-01-14 DIAGNOSIS — Z5901 Sheltered homelessness: Secondary | ICD-10-CM | POA: Diagnosis not present

## 2022-01-14 DIAGNOSIS — M1990 Unspecified osteoarthritis, unspecified site: Secondary | ICD-10-CM | POA: Diagnosis present

## 2022-01-14 DIAGNOSIS — I428 Other cardiomyopathies: Secondary | ICD-10-CM | POA: Diagnosis present

## 2022-01-14 DIAGNOSIS — Z7901 Long term (current) use of anticoagulants: Secondary | ICD-10-CM | POA: Diagnosis not present

## 2022-01-14 DIAGNOSIS — F259 Schizoaffective disorder, unspecified: Secondary | ICD-10-CM | POA: Diagnosis present

## 2022-01-14 DIAGNOSIS — Z888 Allergy status to other drugs, medicaments and biological substances status: Secondary | ICD-10-CM | POA: Diagnosis not present

## 2022-01-14 DIAGNOSIS — I130 Hypertensive heart and chronic kidney disease with heart failure and stage 1 through stage 4 chronic kidney disease, or unspecified chronic kidney disease: Secondary | ICD-10-CM | POA: Diagnosis present

## 2022-01-14 LAB — BASIC METABOLIC PANEL
Anion gap: 8 (ref 5–15)
Anion gap: 9 (ref 5–15)
BUN: 21 mg/dL (ref 8–23)
BUN: 22 mg/dL (ref 8–23)
CO2: 23 mmol/L (ref 22–32)
CO2: 27 mmol/L (ref 22–32)
Calcium: 8.7 mg/dL — ABNORMAL LOW (ref 8.9–10.3)
Calcium: 8.8 mg/dL — ABNORMAL LOW (ref 8.9–10.3)
Chloride: 103 mmol/L (ref 98–111)
Chloride: 104 mmol/L (ref 98–111)
Creatinine, Ser: 1.16 mg/dL (ref 0.61–1.24)
Creatinine, Ser: 1.23 mg/dL (ref 0.61–1.24)
GFR, Estimated: >60 mL/min (ref >60)
GFR, Estimated: >60 mL/min (ref >60)
Glucose, Bld: 125 mg/dL — ABNORMAL HIGH (ref 70–99)
Glucose, Bld: 84 mg/dL (ref 70–99)
Potassium: 4 mmol/L (ref 3.5–5.1)
Potassium: 5.2 mmol/L — ABNORMAL HIGH (ref 3.5–5.1)
Sodium: 136 mmol/L (ref 135–145)
Sodium: 138 mmol/L (ref 135–145)

## 2022-01-14 LAB — CBC WITH DIFFERENTIAL/PLATELET
Abs Immature Granulocytes: 0.01 10*3/uL (ref 0.00–0.07)
Basophils Absolute: 0.1 10*3/uL (ref 0.0–0.1)
Basophils Relative: 1 %
Eosinophils Absolute: 0.4 10*3/uL (ref 0.0–0.5)
Eosinophils Relative: 7 %
HCT: 41.1 % (ref 39.0–52.0)
Hemoglobin: 13.3 g/dL (ref 13.0–17.0)
Immature Granulocytes: 0 %
Lymphocytes Relative: 25 %
Lymphs Abs: 1.3 10*3/uL (ref 0.7–4.0)
MCH: 29.4 pg (ref 26.0–34.0)
MCHC: 32.4 g/dL (ref 30.0–36.0)
MCV: 90.7 fL (ref 80.0–100.0)
Monocytes Absolute: 0.4 10*3/uL (ref 0.1–1.0)
Monocytes Relative: 8 %
Neutro Abs: 3.2 10*3/uL (ref 1.7–7.7)
Neutrophils Relative %: 59 %
Platelets: 226 10*3/uL (ref 150–400)
RBC: 4.53 MIL/uL (ref 4.22–5.81)
RDW: 16.2 % — ABNORMAL HIGH (ref 11.5–15.5)
WBC: 5.4 10*3/uL (ref 4.0–10.5)
nRBC: 0 % (ref 0.0–0.2)

## 2022-01-14 LAB — TROPONIN I (HIGH SENSITIVITY)
Troponin I (High Sensitivity): 44 ng/L — ABNORMAL HIGH (ref <18)
Troponin I (High Sensitivity): 45 ng/L — ABNORMAL HIGH (ref <18)

## 2022-01-14 LAB — DIGOXIN LEVEL: Digoxin Level: <0.2 ng/mL — ABNORMAL LOW (ref 0.8–2.0)

## 2022-01-14 LAB — BRAIN NATRIURETIC PEPTIDE: B Natriuretic Peptide: 3212.1 pg/mL — ABNORMAL HIGH (ref 0.0–100.0)

## 2022-01-14 LAB — HIV ANTIBODY (ROUTINE TESTING W REFLEX): HIV Screen 4th Generation wRfx: NONREACTIVE

## 2022-01-14 MED ORDER — ONDANSETRON HCL 4 MG/2ML IJ SOLN: 4.0000 mg | Freq: Four times a day (QID) | INTRAMUSCULAR | Status: DC | PRN

## 2022-01-14 MED ORDER — FUROSEMIDE 10 MG/ML IJ SOLN: 60.0000 mg | Freq: Once | INTRAMUSCULAR | Status: DC

## 2022-01-14 MED ORDER — CARVEDILOL 3.125 MG PO TABS
3.1250 mg | ORAL_TABLET | Freq: Two times a day (BID) | ORAL | Status: DC
  Administered 2022-01-15 – 2022-01-17 (×5): 3.125 mg via ORAL
  Filled 2022-01-14 (×5): qty 1

## 2022-01-14 MED ORDER — IPRATROPIUM-ALBUTEROL 0.5-2.5 (3) MG/3ML IN SOLN
3.0000 mL | Freq: Once | RESPIRATORY_TRACT | Status: AC
  Administered 2022-01-14: 3 mL via RESPIRATORY_TRACT
  Filled 2022-01-14: qty 3

## 2022-01-14 MED ORDER — FUROSEMIDE 10 MG/ML IJ SOLN
40.0000 mg | Freq: Two times a day (BID) | INTRAMUSCULAR | Status: DC
  Administered 2022-01-14: 40 mg via INTRAVENOUS
  Filled 2022-01-14: qty 4

## 2022-01-14 MED ORDER — METOPROLOL SUCCINATE ER 25 MG PO TB24: 25.0000 mg | ORAL_TABLET | Freq: Every day | ORAL | Status: DC

## 2022-01-14 MED ORDER — ALUM & MAG HYDROXIDE-SIMETH 200-200-20 MG/5ML PO SUSP
30.0000 mL | Freq: Once | ORAL | Status: AC
  Administered 2022-01-14: 30 mL via ORAL
  Filled 2022-01-14: qty 30

## 2022-01-14 MED ORDER — LOSARTAN POTASSIUM 25 MG PO TABS
25.0000 mg | ORAL_TABLET | Freq: Every day | ORAL | Status: DC
  Administered 2022-01-14: 25 mg via ORAL
  Filled 2022-01-14: qty 1

## 2022-01-14 MED ORDER — NITROGLYCERIN 0.4 MG SL SUBL: 0.4000 mg | SUBLINGUAL_TABLET | SUBLINGUAL | Status: DC | PRN

## 2022-01-14 MED ORDER — ACETAMINOPHEN 325 MG PO TABS: 650.0000 mg | ORAL_TABLET | ORAL | Status: DC | PRN

## 2022-01-14 MED ORDER — RIVAROXABAN 20 MG PO TABS
20.0000 mg | ORAL_TABLET | Freq: Every day | ORAL | Status: DC
  Administered 2022-01-14 – 2022-01-16 (×3): 20 mg via ORAL
  Filled 2022-01-14 (×3): qty 1

## 2022-01-14 MED ORDER — SODIUM CHLORIDE 0.9% FLUSH: 3.0000 mL | INTRAVENOUS | Status: DC | PRN

## 2022-01-14 MED ORDER — GABAPENTIN 400 MG PO CAPS: 400.0000 mg | ORAL_CAPSULE | Freq: Every day | ORAL | Status: DC | PRN

## 2022-01-14 MED ORDER — ATORVASTATIN CALCIUM 80 MG PO TABS
80.0000 mg | ORAL_TABLET | Freq: Every day | ORAL | Status: DC
  Administered 2022-01-14 – 2022-01-17 (×4): 80 mg via ORAL
  Filled 2022-01-14 (×4): qty 1

## 2022-01-14 MED ORDER — SODIUM CHLORIDE 0.9 % IV SOLN: 250.0000 mL | INTRAVENOUS | Status: DC | PRN

## 2022-01-14 MED ORDER — FUROSEMIDE 10 MG/ML IJ SOLN
60.0000 mg | Freq: Once | INTRAMUSCULAR | Status: AC
  Administered 2022-01-14: 60 mg via INTRAVENOUS
  Filled 2022-01-14: qty 6

## 2022-01-14 MED ORDER — SPIRONOLACTONE 12.5 MG HALF TABLET
12.5000 mg | ORAL_TABLET | Freq: Every day | ORAL | Status: DC
  Administered 2022-01-14 – 2022-01-17 (×4): 12.5 mg via ORAL
  Filled 2022-01-14 (×5): qty 1

## 2022-01-14 MED ORDER — DIGOXIN 125 MCG PO TABS
0.1250 mg | ORAL_TABLET | Freq: Every day | ORAL | Status: DC
  Administered 2022-01-14 – 2022-01-17 (×4): 0.125 mg via ORAL
  Filled 2022-01-14 (×4): qty 1

## 2022-01-14 MED ORDER — SODIUM CHLORIDE 0.9% FLUSH
3.0000 mL | Freq: Two times a day (BID) | INTRAVENOUS | Status: DC
  Administered 2022-01-14 – 2022-01-17 (×7): 3 mL via INTRAVENOUS

## 2022-01-14 MED ORDER — ASPIRIN 81 MG PO TBEC
81.0000 mg | DELAYED_RELEASE_TABLET | Freq: Every day | ORAL | Status: DC
  Administered 2022-01-14 – 2022-01-17 (×4): 81 mg via ORAL
  Filled 2022-01-14 (×4): qty 1

## 2022-01-14 MED ORDER — CYCLOBENZAPRINE HCL 10 MG PO TABS: 10.0000 mg | ORAL_TABLET | Freq: Two times a day (BID) | ORAL | Status: DC | PRN

## 2022-01-14 NOTE — ED Notes (Signed)
Patient left the floor in stable condition with transport.  

## 2022-01-14 NOTE — Progress Notes (Signed)
Heart Failure Stewardship Pharmacist Progress Note   PCP: Pcp, No PCP-Cardiologist: None    HPI:  Allen Mayer 61 yo male with PMH significant for HTN, HFrEF, COPD, and multi-substance use disorder. He presents to the ED with increased SOB, dyspnea with exertion, and bilateral LEE. He also endorses pinching chest discomfort that are sporadic and not associated with exertion. He states that he has been out of his medications for the past few days as he is currently living in a hotel. He does have a history of non-compliance. Currently endorses cocaine usage.  Of note, the patient has presented to the ED multiple times over the past month for various complaints. Most recent hospitalization with HF exacerbation was 7/24- 11/04/21.   CXR on 10/6 shows stable cardiomegaly. Last ECHO in  10/2021 EF <20% with severely reduced RV.  Current HF Medications: Diuretic: furosemide 40mg  IV BID  Beta Blocker: metoprolol succinate 25mg  once daily  ACE/ARB/ARNI: losartan 25mg  once daily MRA: spironolactone 25mg  1/2 tablet once daily Other: digoxin 0.125mg  once daily  Prior to admission HF Medications: Diuretic: torsemide 20mg  once daily  ACE/ARB/ARNI: losartan 25mg  once daily  MRA: spironolactone 25mg  1/2 tablet once daily  Other: digoxin 0.125mg  once daily   Pertinent Lab Values: Serum creatinine 1.23, BUN 21, Potassium 4.0, Sodium 136, BNP 3121.1, Magnesium 2.0 (9/26), A1c 6.0 (12/14/20), Digoxin <0.2   Vital Signs: Weight: no current weight last weight on file: 178 lbs Blood pressure: 105-119/72-90  Heart rate: 77-102  I/O:  net -1.5L  Medication Assistance / Insurance Benefits Check: Does the patient have prescription insurance?  Yes Type of insurance plan: [employer] [place] Medicaid  Outpatient Pharmacy:  Prior to admission outpatient pharmacy: [facility] Is the patient willing to use [facility] pharmacy at discharge? Pending Is the patient willing to transition their outpatient pharmacy to utilize a [facility] outpatient pharmacy?   Pending   Assessment: 1. Acute on chronic systolic CHF (LVEF <97%), due to substance abuse. NYHA class III-IV symptoms. - Continue current MRA therapy. Continue to monitoring kidney function and serum electrolytes.  -Consider switching losartan for Entresto therapy at this time.  -Given current cocaine use, no appropriate for metoprolol utilization. Carvedilol recommended with cocaine use. -Consider SGLT2i therapy during this hospitalization.  -Will need a PA for any of the SGLT2i and ARNI therapy options.  -Urine output currently good. Continue IV diuretic therapy  - Continue digoxin 0.125mg  once daily. Redraw lab in a few days as the most recent laboratory value will not be an accurate measurement d/t medication noncompliance. -Keep K> 4 and Mg>2   Plan: 1) Medication changes recommended at this time: -Discontinue metoprolol succinate -Initiate carvedilol 3.125mg  BID -Initiate Entresto 24/26mg  BID  -Initiate Farxiga 10mg  once daily -Check serum Mg with AM labs on 10/7  2) Patient assistance: -has [place] Medicaid insurance -Needs PA for SGLT2i and Entresto   3)  Education  - To be completed prior to discharge  Maryan Puls, PharmD PGY-1 [facility] Resident

## 2022-01-14 NOTE — Plan of Care (Signed)
  Problem: Education: Goal: Knowledge of General Education information will improve Description Including pain rating scale, medication(s)/side effects and non-pharmacologic comfort measures Outcome: Progressing   Problem: Health Behavior/Discharge Planning: Goal: Ability to manage health-related needs will improve Outcome: Progressing   
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
## 2022-01-14 NOTE — Progress Notes (Signed)
[facility] PLAN OF CARE NOTE Patient: Allen Mayer LYY:[id number]   PCP: Pcp, No DOB: Jul 31, 1960   DOA: 01/13/2022   DOS: 01/14/2022    Patient was admitted by my colleague earlier on 01/14/2022. I have reviewed the H&P as well as assessment and plan and agree with the same. Important changes in the plan are listed below.  Plan of care: Principal Problem:   Acute on chronic combined systolic and diastolic CHF (congestive heart failure) (HCC) Active Problems:   Cocaine abuse (HCC)   COPD (chronic obstructive pulmonary disease) (HCC)   Tobacco abuse   Neuropathic pain   Essential hypertension, benign   LV (left ventricular) mural thrombus   Long term (current) use of anticoagulants  Continue diuresis, change lasix to bid  Author: Berle Mull, MD [facility] 01/14/2022 8:08 AM   If 7PM-7AM, please contact night-coverage at [email or web address]
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
## 2022-01-15 DIAGNOSIS — I5043 Acute on chronic combined systolic (congestive) and diastolic (congestive) heart failure: Secondary | ICD-10-CM | POA: Diagnosis not present

## 2022-01-15 LAB — BASIC METABOLIC PANEL
Anion gap: 9 (ref 5–15)
BUN: 25 mg/dL — ABNORMAL HIGH (ref 8–23)
CO2: 27 mmol/L (ref 22–32)
Calcium: 8.9 mg/dL (ref 8.9–10.3)
Chloride: 103 mmol/L (ref 98–111)
Creatinine, Ser: 1.52 mg/dL — ABNORMAL HIGH (ref 0.61–1.24)
GFR, Estimated: 51 mL/min — ABNORMAL LOW (ref >60)
Glucose, Bld: 110 mg/dL — ABNORMAL HIGH (ref 70–99)
Potassium: 4 mmol/L (ref 3.5–5.1)
Sodium: 139 mmol/L (ref 135–145)

## 2022-01-15 LAB — MAGNESIUM: Magnesium: 1.8 mg/dL (ref 1.7–2.4)

## 2022-01-15 MED ORDER — UMECLIDINIUM BROMIDE 62.5 MCG/ACT IN AEPB
1.0000 | INHALATION_SPRAY | Freq: Every day | RESPIRATORY_TRACT | Status: DC
  Filled 2022-01-15: qty 7

## 2022-01-15 MED ORDER — IPRATROPIUM-ALBUTEROL 0.5-2.5 (3) MG/3ML IN SOLN: 3.0000 mL | RESPIRATORY_TRACT | Status: DC | PRN

## 2022-01-15 MED ORDER — DM-GUAIFENESIN ER 30-600 MG PO TB12
1.0000 | ORAL_TABLET | Freq: Two times a day (BID) | ORAL | Status: DC
  Administered 2022-01-15 – 2022-01-17 (×5): 1 via ORAL
  Filled 2022-01-15 (×5): qty 1

## 2022-01-15 MED ORDER — CALCIUM CARBONATE ANTACID 500 MG PO CHEW
400.0000 mg | CHEWABLE_TABLET | Freq: Once | ORAL | Status: AC
  Administered 2022-01-15: 400 mg via ORAL
  Filled 2022-01-15: qty 2

## 2022-01-15 MED ORDER — BENZONATATE 100 MG PO CAPS
100.0000 mg | ORAL_CAPSULE | Freq: Three times a day (TID) | ORAL | Status: DC
  Administered 2022-01-15 – 2022-01-17 (×7): 100 mg via ORAL
  Filled 2022-01-15 (×7): qty 1

## 2022-01-15 MED ORDER — UMECLIDINIUM BROMIDE 62.5 MCG/ACT IN AEPB
1.0000 | INHALATION_SPRAY | Freq: Every day | RESPIRATORY_TRACT | Status: DC
  Administered 2022-01-16 – 2022-01-17 (×2): 1 via RESPIRATORY_TRACT
  Filled 2022-01-15: qty 7

## 2022-01-15 MED ORDER — MOMETASONE FURO-FORMOTEROL FUM 200-5 MCG/ACT IN AERO
2.0000 | INHALATION_SPRAY | Freq: Two times a day (BID) | RESPIRATORY_TRACT | Status: DC
  Administered 2022-01-15 – 2022-01-17 (×5): 2 via RESPIRATORY_TRACT
  Filled 2022-01-15: qty 8.8

## 2022-01-15 MED ORDER — ORAL CARE MOUTH RINSE: 15.0000 mL | OROMUCOSAL | Status: DC | PRN

## 2022-01-15 NOTE — Plan of Care (Signed)
  Problem: Education: Goal: Knowledge of General Education information will improve Description: Including pain rating scale, medication(s)/side effects and non-pharmacologic comfort measures Outcome: Progressing   Problem: Health Behavior/Discharge Planning: Goal: Ability to manage health-related needs will improve Outcome: Progressing   Problem: Clinical Measurements: Goal: Ability to maintain clinical measurements within normal limits will improve Outcome: Progressing Goal: Will remain free from infection Outcome: Progressing Goal: Diagnostic test results will improve Outcome: Progressing Goal: Respiratory complications will improve Outcome: Progressing Goal: Cardiovascular complication will be avoided Outcome: Progressing   Problem: Activity: Goal: Risk for activity intolerance will decrease Outcome: Progressing   Problem: Nutrition: Goal: Adequate nutrition will be maintained Outcome: Progressing   Problem: Coping: Goal: Level of anxiety will decrease Outcome: Progressing   Problem: Elimination: Goal: Will not experience complications related to bowel motility Outcome: Progressing Goal: Will not experience complications related to urinary retention Outcome: Progressing   Problem: Pain Managment: Goal: General experience of comfort will improve Outcome: Progressing   Problem: Skin Integrity: Goal: Risk for impaired skin integrity will decrease Outcome: Progressing   Problem: Education: Goal: Knowledge of General Education information will improve Description: Including pain rating scale, medication(s)/side effects and non-pharmacologic comfort measures Outcome: Progressing   Problem: Health Behavior/Discharge Planning: Goal: Ability to manage health-related needs will improve Outcome: Progressing   Problem: Clinical Measurements: Goal: Ability to maintain clinical measurements within normal limits will improve Outcome: Progressing Goal: Will remain free  from infection Outcome: Progressing Goal: Diagnostic test results will improve Outcome: Progressing Goal: Respiratory complications will improve Outcome: Progressing Goal: Cardiovascular complication will be avoided Outcome: Progressing   Problem: Activity: Goal: Risk for activity intolerance will decrease Outcome: Progressing   Problem: Nutrition: Goal: Adequate nutrition will be maintained Outcome: Progressing   Problem: Coping: Goal: Level of anxiety will decrease Outcome: Progressing   Problem: Elimination: Goal: Will not experience complications related to bowel motility Outcome: Progressing Goal: Will not experience complications related to urinary retention Outcome: Progressing   Problem: Pain Managment: Goal: General experience of comfort will improve Outcome: Progressing   Problem: Safety: Goal: Ability to remain free from injury will improve Outcome: Progressing   Problem: Skin Integrity: Goal: Risk for impaired skin integrity will decrease Outcome: Progressing   Problem: Education: Goal: Ability to demonstrate management of disease process will improve Outcome: Progressing Goal: Ability to verbalize understanding of medication therapies will improve Outcome: Progressing Goal: Individualized Educational Video(s) Outcome: Progressing   Problem: Activity: Goal: Capacity to carry out activities will improve Outcome: Progressing   Problem: Cardiac: Goal: Ability to achieve and maintain adequate cardiopulmonary perfusion will improve Outcome: Progressing
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
## 2022-01-16 ENCOUNTER — Encounter (HOSPITAL_COMMUNITY): Payer: Self-pay | Admitting: Internal Medicine

## 2022-01-16 DIAGNOSIS — I5043 Acute on chronic combined systolic (congestive) and diastolic (congestive) heart failure: Secondary | ICD-10-CM | POA: Diagnosis not present

## 2022-01-16 LAB — BASIC METABOLIC PANEL
Anion gap: 7 (ref 5–15)
BUN: 22 mg/dL (ref 8–23)
CO2: 27 mmol/L (ref 22–32)
Calcium: 9.1 mg/dL (ref 8.9–10.3)
Chloride: 100 mmol/L (ref 98–111)
Creatinine, Ser: 1.37 mg/dL — ABNORMAL HIGH (ref 0.61–1.24)
GFR, Estimated: 58 mL/min — ABNORMAL LOW (ref >60)
Glucose, Bld: 104 mg/dL — ABNORMAL HIGH (ref 70–99)
Potassium: 4.7 mmol/L (ref 3.5–5.1)
Sodium: 134 mmol/L — ABNORMAL LOW (ref 135–145)

## 2022-01-16 NOTE — Progress Notes (Signed)
2200 patient refusing medications education given still refused 01/16/2022 130am patient coughing and having lots thick film education on the medications that were refused earlier in the evening patient wanting to take the medications now so medication was given late
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
## 2022-01-17 ENCOUNTER — Inpatient Hospital Stay (HOSPITAL_COMMUNITY): Payer: Medicaid Other

## 2022-01-17 ENCOUNTER — Other Ambulatory Visit (HOSPITAL_COMMUNITY): Payer: Self-pay

## 2022-01-17 DIAGNOSIS — I5043 Acute on chronic combined systolic (congestive) and diastolic (congestive) heart failure: Secondary | ICD-10-CM | POA: Diagnosis not present

## 2022-01-17 DIAGNOSIS — I441 Atrioventricular block, second degree: Secondary | ICD-10-CM | POA: Diagnosis present

## 2022-01-17 LAB — BASIC METABOLIC PANEL
Anion gap: 13 (ref 5–15)
BUN: 27 mg/dL — ABNORMAL HIGH (ref 8–23)
CO2: 23 mmol/L (ref 22–32)
Calcium: 9.6 mg/dL (ref 8.9–10.3)
Chloride: 100 mmol/L (ref 98–111)
Creatinine, Ser: 1.26 mg/dL — ABNORMAL HIGH (ref 0.61–1.24)
GFR, Estimated: >60 mL/min (ref >60)
Glucose, Bld: 85 mg/dL (ref 70–99)
Potassium: 5.2 mmol/L — ABNORMAL HIGH (ref 3.5–5.1)
Sodium: 136 mmol/L (ref 135–145)

## 2022-01-17 LAB — DIGOXIN LEVEL: Digoxin Level: 0.4 ng/mL — ABNORMAL LOW (ref 0.8–2.0)

## 2022-01-17 MED ORDER — UMECLIDINIUM BROMIDE 62.5 MCG/ACT IN AEPB
1.0000 | INHALATION_SPRAY | Freq: Every day | RESPIRATORY_TRACT | 0 refills | Status: DC
  Filled 2022-01-17: qty 30, 30d supply, fill #0

## 2022-01-17 MED ORDER — FUROSEMIDE 40 MG PO TABS
40.0000 mg | ORAL_TABLET | Freq: Every day | ORAL | 0 refills | Status: DC
  Filled 2022-01-17: qty 30, 30d supply, fill #0

## 2022-01-17 MED ORDER — RIVAROXABAN 20 MG PO TABS
20.0000 mg | ORAL_TABLET | Freq: Every day | ORAL | 0 refills | Status: DC
  Filled 2022-01-17: qty 30, 30d supply, fill #0

## 2022-01-17 MED ORDER — LOSARTAN POTASSIUM 25 MG PO TABS
25.0000 mg | ORAL_TABLET | Freq: Every day | ORAL | 0 refills | Status: DC
  Filled 2022-01-17: qty 30, 30d supply, fill #0

## 2022-01-17 MED ORDER — ASPIRIN 81 MG PO TBEC
81.0000 mg | DELAYED_RELEASE_TABLET | Freq: Every day | ORAL | 0 refills | Status: DC
  Filled 2022-01-17: qty 30, 30d supply, fill #0

## 2022-01-17 MED ORDER — PANTOPRAZOLE SODIUM 40 MG PO TBEC
40.0000 mg | DELAYED_RELEASE_TABLET | Freq: Every day | ORAL | 0 refills | Status: DC
  Filled 2022-01-17: qty 30, 30d supply, fill #0

## 2022-01-17 MED ORDER — SODIUM ZIRCONIUM CYCLOSILICATE 10 G PO PACK
10.0000 g | PACK | Freq: Every day | ORAL | Status: DC
  Administered 2022-01-17: 10 g via ORAL
  Filled 2022-01-17: qty 1

## 2022-01-17 MED ORDER — DM-GUAIFENESIN ER 30-600 MG PO TB12
1.0000 | ORAL_TABLET | Freq: Two times a day (BID) | ORAL | 0 refills | Status: DC
  Filled 2022-01-17: qty 20, 10d supply, fill #0

## 2022-01-17 MED ORDER — ARIPIPRAZOLE 5 MG PO TABS
10.0000 mg | ORAL_TABLET | Freq: Every day | ORAL | Status: DC
  Administered 2022-01-17: 10 mg via ORAL
  Filled 2022-01-17: qty 2

## 2022-01-17 MED ORDER — ARIPIPRAZOLE 10 MG PO TABS
10.0000 mg | ORAL_TABLET | Freq: Every day | ORAL | 0 refills | Status: DC
  Filled 2022-01-17: qty 30, 30d supply, fill #0

## 2022-01-17 MED ORDER — PANTOPRAZOLE SODIUM 40 MG PO TBEC
40.0000 mg | DELAYED_RELEASE_TABLET | Freq: Every day | ORAL | Status: DC
  Administered 2022-01-17: 40 mg via ORAL
  Filled 2022-01-17: qty 1

## 2022-01-17 MED ORDER — ATORVASTATIN CALCIUM 80 MG PO TABS
80.0000 mg | ORAL_TABLET | Freq: Every day | ORAL | 0 refills | Status: DC
  Filled 2022-01-17: qty 30, 30d supply, fill #0

## 2022-01-17 MED ORDER — CARVEDILOL 3.125 MG PO TABS
3.1250 mg | ORAL_TABLET | Freq: Two times a day (BID) | ORAL | 0 refills | Status: DC
  Filled 2022-01-17: qty 60, 30d supply, fill #0

## 2022-01-17 MED ORDER — BENZONATATE 100 MG PO CAPS
100.0000 mg | ORAL_CAPSULE | Freq: Three times a day (TID) | ORAL | 0 refills | Status: DC
  Filled 2022-01-17: qty 20, 7d supply, fill #0

## 2022-01-17 MED ORDER — BENZTROPINE MESYLATE 1 MG PO TABS
1.0000 mg | ORAL_TABLET | Freq: Two times a day (BID) | ORAL | Status: DC
  Filled 2022-01-17 (×2): qty 1

## 2022-01-17 MED ORDER — ALBUTEROL SULFATE HFA 108 (90 BASE) MCG/ACT IN AERS
1.0000 | INHALATION_SPRAY | Freq: Four times a day (QID) | RESPIRATORY_TRACT | 0 refills | Status: DC | PRN
  Filled 2022-01-17: qty 18, 14d supply, fill #0

## 2022-01-17 MED ORDER — DIGOXIN 125 MCG PO TABS
0.1250 mg | ORAL_TABLET | Freq: Every day | ORAL | 0 refills | Status: DC
  Filled 2022-01-17: qty 30, 30d supply, fill #0

## 2022-01-17 MED ORDER — MOMETASONE FURO-FORMOTEROL FUM 200-5 MCG/ACT IN AERO
2.0000 | INHALATION_SPRAY | Freq: Two times a day (BID) | RESPIRATORY_TRACT | 0 refills | Status: DC
  Filled 2022-01-17: qty 13, 30d supply, fill #0

## 2022-01-17 MED ORDER — BENZTROPINE MESYLATE 1 MG PO TABS
1.0000 mg | ORAL_TABLET | Freq: Two times a day (BID) | ORAL | 0 refills | Status: DC
  Filled 2022-01-17: qty 30, 15d supply, fill #0

## 2022-01-17 MED ORDER — FLOVENT HFA 110 MCG/ACT IN AERO
2.0000 | INHALATION_SPRAY | Freq: Two times a day (BID) | RESPIRATORY_TRACT | 0 refills | Status: DC
  Filled 2022-01-17: qty 12, 30d supply, fill #0

## 2022-01-17 MED ORDER — CITALOPRAM HYDROBROMIDE 20 MG PO TABS
20.0000 mg | ORAL_TABLET | Freq: Every day | ORAL | 0 refills | Status: DC
  Filled 2022-01-17: qty 30, 30d supply, fill #0

## 2022-01-17 MED ORDER — CYCLOBENZAPRINE HCL 10 MG PO TABS
10.0000 mg | ORAL_TABLET | Freq: Two times a day (BID) | ORAL | 0 refills | Status: DC | PRN
  Filled 2022-01-17: qty 20, 10d supply, fill #0
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
## 2022-01-17 NOTE — Progress Notes (Signed)
Heart Failure Stewardship Pharmacist Progress Note   PCP: Pcp, No PCP-Cardiologist: None    HPI:  61 yo male with PMH significant for HTN, HFrEF, COPD, LV thrombus, schizophrenia, and polysubstance use disorder.   History of HFrEF dating back to 2019. EF 25-30%. Cath at that time with normal coronaries. Repeat ECHOs with EF <20% to 30-35% in the setting on ongoing substance use and noncompliance. Cath again in April without major ischemia.   Has presented to the ED multiple times over the past month for various complaints. Most recent hospitalization with HF exacerbation was 7/24-7/27/23.   He presented to the ED on 10/5 with increased SOB, dyspnea with exertion, and bilateral LEE. He also endorses pinching chest discomfort that are sporadic and not associated with exertion. He stated that he has been out of his medications for the past few days as he is currently living in a hotel. He does have a history of non-compliance. Currently endorses cocaine usage.  CXR on 10/6 shows stable cardiomegaly. Last ECHO in 10/2021 showed LVEF <20% with severely reduced RV.  Current HF Medications: Beta Blocker: carvedilol 3.125 mg BID MRA: spironolactone 12.5 mg daily Other: digoxin 0.125 mg daily  Prior to admission HF Medications: Diuretic: torsemide 20mg  once daily  ACE/ARB/ARNI: losartan 25mg  once daily  MRA: spironolactone 25mg  1/2 tablet once daily  Other: digoxin 0.125mg  once daily   Pertinent Lab Values: Serum creatinine 1.26, BUN 27, Potassium 5.2, Sodium 136, BNP 3121.1, Magnesium 1.8, Digoxin 0.4 (10/9)  Vital Signs: Weight: 184 lbs (admission weight 190 lbs) Blood pressure: 120-140/80s Heart rate: 70-80s I/O: -650 mL yesterday; net -2.3L  Medication Assistance / Insurance Benefits Check: Does the patient have prescription insurance?  Yes Type of insurance plan: [place] Medicaid  Outpatient Pharmacy:  Prior to admission outpatient pharmacy: [facility] Is the patient willing to use [facility] pharmacy at discharge? Yes Is the patient willing to transition their outpatient pharmacy to utilize a [facility] outpatient pharmacy?   Pending   Assessment: 1. Acute on chronic systolic CHF (LVEF <06%), due to substance abuse. NYHA class III-IV symptoms. - IV lasix on hold with bump in renal function. SCr improved today. Consider resuming diuretics. - Continue carvedilol 3.125 mg BID - Off losartan with bump in SCr. BP elevated today, consider resuming with improvement in renal function. Watch potassium.  - Continue spironolactone 12.5 mg daily. K 5.2 - monitor. Lokelma 10 g x 1 ordered today.  - Consider adding SGLT2i prior to discharge - Continue digoxin 0.125 mg daily. Level on 10/9 ok at 0.4.   Plan: 1) Medication changes recommended at this time: - Restart diuretics and losartan 25 mg daily  2) Patient assistance: - Has [place] Medicaid  - Needs prior authorization for SGLT2i and Entresto   3)  Education  - Initial counseling provided - Full education to be completed prior to discharge  Kerby Nora, PharmD, BCPS Heart Failure Cytogeneticist Phone [phone]

## 2022-01-17 NOTE — Progress Notes (Signed)
The  patient HR dropped to 39 but didn't sustained. I chekced on him and he was resting comfortably with HR of 89 Notified Dr. Velia Meyer who responded without any new order. Will continue to monitor.

## 2022-01-18 ENCOUNTER — Emergency Department (HOSPITAL_COMMUNITY): Payer: Medicaid Other

## 2022-01-18 ENCOUNTER — Other Ambulatory Visit: Payer: Self-pay

## 2022-01-18 ENCOUNTER — Emergency Department (HOSPITAL_COMMUNITY)
Admission: EM | Admit: 2022-01-18 | Discharge: 2022-01-19 | Discharge status: Against Medical Advice | Payer: Medicaid Other | Attending: Emergency Medicine | Admitting: Emergency Medicine

## 2022-01-18 ENCOUNTER — Encounter (HOSPITAL_COMMUNITY): Payer: Self-pay | Admitting: *Deleted

## 2022-01-18 DIAGNOSIS — R110 Nausea: Secondary | ICD-10-CM | POA: Diagnosis present

## 2022-01-18 DIAGNOSIS — R079 Chest pain, unspecified: Secondary | ICD-10-CM | POA: Diagnosis not present

## 2022-01-18 DIAGNOSIS — Z5321 Procedure and treatment not carried out due to patient leaving prior to being seen by health care provider: Secondary | ICD-10-CM | POA: Insufficient documentation

## 2022-01-18 LAB — COMPREHENSIVE METABOLIC PANEL
ALT: 37 U/L (ref 0–44)
AST: 40 U/L (ref 15–41)
Albumin: 2.8 g/dL — ABNORMAL LOW (ref 3.5–5.0)
Alkaline Phosphatase: 94 U/L (ref 38–126)
Anion gap: 9 (ref 5–15)
BUN: 32 mg/dL — ABNORMAL HIGH (ref 8–23)
CO2: 23 mmol/L (ref 22–32)
Calcium: 8.7 mg/dL — ABNORMAL LOW (ref 8.9–10.3)
Chloride: 103 mmol/L (ref 98–111)
Creatinine, Ser: 1.47 mg/dL — ABNORMAL HIGH (ref 0.61–1.24)
GFR, Estimated: 54 mL/min — ABNORMAL LOW (ref >60)
Glucose, Bld: 80 mg/dL (ref 70–99)
Potassium: 4.1 mmol/L (ref 3.5–5.1)
Sodium: 135 mmol/L (ref 135–145)
Total Bilirubin: 1.7 mg/dL — ABNORMAL HIGH (ref 0.3–1.2)
Total Protein: 6.1 g/dL — ABNORMAL LOW (ref 6.5–8.1)

## 2022-01-18 LAB — CBC WITH DIFFERENTIAL/PLATELET
Abs Immature Granulocytes: 0.02 10*3/uL (ref 0.00–0.07)
Basophils Absolute: 0.1 10*3/uL (ref 0.0–0.1)
Basophils Relative: 1 %
Eosinophils Absolute: 0.1 10*3/uL (ref 0.0–0.5)
Eosinophils Relative: 2 %
HCT: 45.4 % (ref 39.0–52.0)
Hemoglobin: 14.2 g/dL (ref 13.0–17.0)
Immature Granulocytes: 0 %
Lymphocytes Relative: 22 %
Lymphs Abs: 1.3 10*3/uL (ref 0.7–4.0)
MCH: 28.6 pg (ref 26.0–34.0)
MCHC: 31.3 g/dL (ref 30.0–36.0)
MCV: 91.3 fL (ref 80.0–100.0)
Monocytes Absolute: 0.6 10*3/uL (ref 0.1–1.0)
Monocytes Relative: 10 %
Neutro Abs: 3.9 10*3/uL (ref 1.7–7.7)
Neutrophils Relative %: 65 %
Platelets: 243 10*3/uL (ref 150–400)
RBC: 4.97 MIL/uL (ref 4.22–5.81)
RDW: 15.9 % — ABNORMAL HIGH (ref 11.5–15.5)
WBC: 5.9 10*3/uL (ref 4.0–10.5)
nRBC: 0 % (ref 0.0–0.2)

## 2022-01-18 LAB — TROPONIN I (HIGH SENSITIVITY): Troponin I (High Sensitivity): 33 ng/L — ABNORMAL HIGH (ref <18)

## 2022-01-18 LAB — LIPASE, BLOOD: Lipase: 39 U/L (ref 11–51)

## 2022-01-18 MED ORDER — ALUM & MAG HYDROXIDE-SIMETH 200-200-20 MG/5ML PO SUSP: 30.0000 mL | Freq: Once | ORAL | Status: DC

## 2022-01-18 NOTE — ED Triage Notes (Signed)
Pt states he has been having pain in the center of his chest and in the right arm since last night. Generalized abdominal pain and has vomiting several times. Pain started to become worse after smoking a cigarette today, so he took a nitro and aspirin which did help somewhat.

## 2022-01-18 NOTE — ED Provider Triage Note (Signed)
  Emergency Medicine Provider Triage Evaluation Note  MRN:  [id number]  Arrival date & time: 01/18/22    Medically screening exam initiated at 10:19 PM.   CC:   Nausea   HPI:  Allen Mayer is a 61 y.o. year-old male presents to the ED with chief complaint of nausea and chest pain.  This is a recurrent problem.  Denies fever.    History provided by patient. ROS:  -As included in HPI PE:   Vitals:   01/18/22 2154  BP: 126/73  Pulse: 66  Resp: 18  Temp: 98.1 F (36.7 C)  SpO2: 96%    Non-toxic appearing No respiratory distress  MDM:  Based on signs and symptoms, GERD is highest on my differential. I've ordered labs and CXR in triage to expedite lab/diagnostic workup.  Patient was informed that the remainder of the evaluation will be completed by another provider, this initial triage assessment does not replace that evaluation, and the importance of remaining in the ED until their evaluation is complete.    Montine Circle, PA-C 01/18/22 2221

## 2022-01-19 NOTE — ED Notes (Signed)
Called patient several times for a room patient didn't answer  
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
## 2022-01-21 ENCOUNTER — Other Ambulatory Visit: Payer: Self-pay | Admitting: Physician Assistant

## 2022-01-24 ENCOUNTER — Encounter (HOSPITAL_COMMUNITY): Payer: Medicaid Other

## 2022-01-24 ENCOUNTER — Telehealth (HOSPITAL_COMMUNITY): Payer: Self-pay

## 2022-01-24 NOTE — Telephone Encounter (Signed)
Called to confirm Heart & Vascular Transitions of Care appointment at 11AM today. Patient reminded to bring all medications and pill box organizer with them. Confirmed patient has transportation. Gave directions, instructed to utilize valet parking.  Confirmed appointment prior to ending call.   Ziasia Lenoir, MSN, RN Heart Failure Nurse Navigator 

## 2022-01-25 ENCOUNTER — Ambulatory Visit: Payer: Self-pay | Admitting: Nurse Practitioner

## 2022-02-05 NOTE — Progress Notes (Deleted)
Cardiology Office Note   Date:  02/05/2022   ID:  [name], DOB Dec 21, 1960, MRN [id number]  PCP:  Pcp, No  Cardiologist:   None   No chief complaint on file.     History of Present Illness: Allen Mayer is a 61 y.o. male who presents for follow up of acute systolic HF and LV thrombus. He has a nonischemic cardiomyopathy.  He has a history of substance abuse.  He typically does not come for any out patient follow up.  He has medication non adherence.  He was last in the hospital earlier this month.  He has an EF of 20%.   He had a large mural thrombus on echo.  He has cath with an EF   He had mild non obstructive coronary plaque.  ***   *** He did have runs of nonsustained ventricular tachycardia in the hospital.  Of note he has a history of cocaine use with previous positive tox screen recently.  We are cautiously using carvedilol rather than metoprolol.  Since he was last seen ***.   ***  Overall I think he is doing relatively well.  He is not short of breath as he was when he came to the hospital although he still does have some dyspnea with exertion.  He says he has had some mild nosebleeds.  He has not noticed any GI bleeding.  I think he has been compliant with his cardiac medications.  He has a cough productive of some white phlegm occasionally but has not had any fevers or chills.  He has had no new PND or orthopnea.  He said no new lower extremity swelling.   Past Medical History:  Diagnosis Date   Acid reflux    Alcohol abuse    Arthritis    Asthma    Back pain    Bronchitis    Chest pain 07/31/2021   CHF (congestive heart failure) (HCC)    CKD (chronic kidney disease)    CKD (chronic kidney disease), stage III (HCC)    Cocaine abuse (HCC)    COPD (chronic obstructive pulmonary disease) (HCC)    History of noncompliance with medical treatment, presenting hazards to health    Homelessness    Hypertension    LV (left ventricular) mural thrombus     Myocardial infarction (HCC)    Neuropathic pain    NICM (nonischemic cardiomyopathy) (HCC) 2019   NSTEMI (non-ST elevated myocardial infarction) (HCC) 07/31/2021   NSVT (nonsustained ventricular tachycardia) (HCC)    Pericardial effusion    Pulmonary hypertension (HCC)    RVF (right ventricular failure) (HCC)    Schizo affective schizophrenia (HCC)     Past Surgical History:  Procedure Laterality Date   I & D EXTREMITY Bilateral 12/14/2020   Procedure: IRRIGATION AND DEBRIDEMENT AND CLOSURE OF LACERTATIONS OF BILATEAL ARMS;  Surgeon: Fritzi Mandes, MD;  Location: [facility] OR;  Service: General;  Laterality: Bilateral;   LAPAROTOMY N/A 12/14/2020   Procedure: EXPLORATORY LAPAROTOMY;  Surgeon: Fritzi Mandes, MD;  Location: [facility] OR;  Service: General;  Laterality: N/A;   None     RIGHT HEART CATH AND CORONARY ANGIOGRAPHY N/A 08/02/2021   Procedure: RIGHT HEART CATH AND CORONARY ANGIOGRAPHY;  Surgeon: Yvonne Kendall, MD;  Location: [facility];  Service: Cardiovascular;  Laterality: N/A;   RIGHT/LEFT HEART CATH AND CORONARY ANGIOGRAPHY N/A 12/13/2017   Procedure: RIGHT/LEFT HEART CATH AND CORONARY ANGIOGRAPHY;  Surgeon: Runell Gess, MD;  Location: [facility];  Service: Cardiovascular;  Laterality: N/A;     Current Outpatient Medications  Medication Sig Dispense Refill   albuterol (VENTOLIN HFA) 108 (90 Base) MCG/ACT inhaler Inhale 1-2 puffs into the lungs every 6 (six) hours as needed for wheezing or shortness of breath. 18 g 0   ARIPiprazole (ABILIFY) 10 MG tablet Take 1 tablet (10 mg total) by mouth daily. 30 tablet 0   aspirin EC 81 MG tablet Take 1 tablet (81 mg total) by mouth daily. Swallow whole. 30 tablet 0   atorvastatin (LIPITOR) 80 MG tablet Take 1 tablet (80 mg total) by mouth daily. 30 tablet 0   benzonatate (TESSALON) 100 MG capsule Take 1 capsule (100 mg total) by mouth 3 (three) times daily. 20 capsule 0   benztropine (COGENTIN) 1 MG tablet Take 1 tablet (1 mg  total) by mouth 2 (two) times daily. 30 tablet 0   citalopram (CELEXA) 20 MG tablet Take 1 tablet (20 mg total) by mouth daily. 30 tablet 0   dextromethorphan-guaiFENesin (MUCINEX DM) 30-600 MG 12hr tablet Take 1 tablet by mouth 2 (two) times daily. 30 tablet 0   digoxin (LANOXIN) 0.125 MG tablet Take 1 tablet (0.125 mg total) by mouth daily. 30 tablet 0   furosemide (LASIX) 40 MG tablet Take 1 tablet (40 mg total) by mouth daily. 30 tablet 0   gabapentin (NEURONTIN) 400 MG capsule Take 1 capsule (400 mg total) by mouth daily as needed (nerve pain). 30 capsule 1   losartan (COZAAR) 25 MG tablet Take 1 tablet (25 mg total) by mouth daily. 30 tablet 0   mometasone-formoterol (DULERA) 200-5 MCG/ACT AERO Inhale 2 puffs into the lungs 2 (two) times daily. 13 g 0   nitroGLYCERIN (NITROSTAT) 0.4 MG SL tablet Place 1 tablet (0.4 mg total) under the tongue every 5 (five) minutes as needed for chest pain. 25 tablet 1   pantoprazole (PROTONIX) 40 MG tablet Take 1 tablet (40 mg total) by mouth daily. 30 tablet 0   rivaroxaban (XARELTO) 20 MG TABS tablet Take 1 tablet (20 mg total) by mouth daily with supper. 30 tablet 0   umeclidinium bromide (INCRUSE ELLIPTA) 62.5 MCG/ACT AEPB Inhale 1 puff into the lungs daily. 30 each 0   No current facility-administered medications for this visit.    Allergies:   Flexeril [cyclobenzaprine]    ROS:  Please see the history of present illness.   Otherwise, review of systems are positive for ***.   All other systems are reviewed and negative.    PHYSICAL EXAM: VS:  There were no vitals taken for this visit. , BMI There is no height or weight on file to calculate BMI. GENERAL:  Well appearing NECK:  No jugular venous distention, waveform within normal limits, carotid upstroke brisk and symmetric, no bruits, no thyromegaly LUNGS:  Clear to auscultation bilaterally CHEST:  Unremarkable HEART:  PMI not displaced or sustained,S1 and S2 within normal limits, no S3, no S4,  no clicks, no rubs, *** murmurs ABD:  Flat, positive bowel sounds normal in frequency in pitch, no bruits, no rebound, no guarding, no midline pulsatile mass, no hepatomegaly, no splenomegaly EXT:  2 plus pulses throughout, no edema, no cyanosis no clubbing    ***GENERAL:  Well appearing NECK:  No jugular venous distention, waveform within normal limits, carotid upstroke brisk and symmetric, no bruits, no thyromegaly LUNGS:  Clear to auscultation bilaterally CHEST:  Unremarkable HEART:  PMI not displaced or sustained,S1 and S2 within  normal limits, no S3, no S4, no clicks, no rubs, no murmurs ABD:  Flat, positive bowel sounds normal in frequency in pitch, no bruits, no rebound, no guarding, no midline pulsatile mass, no hepatomegaly, no splenomegaly EXT:  2 plus pulses throughout, no edema, no cyanosis no clubbing   EKG:  EKG is *** ordered today. ***   Recent Labs: 11/02/2021: TSH 1.371 01/13/2022: B Natriuretic Peptide 3,212.1 01/15/2022: Magnesium 1.8 01/18/2022: ALT 37; BUN 32; Creatinine, Ser 1.47; Hemoglobin 14.2; Platelets 243; Potassium 4.1; Sodium 135    Lipid Panel    Component Value Date/Time   CHOL 106 07/31/2021 0810   TRIG 45 07/31/2021 0810   HDL 28 (L) 07/31/2021 0810   CHOLHDL 3.8 07/31/2021 0810   VLDL 9 07/31/2021 0810   LDLCALC 69 07/31/2021 0810      Wt Readings from Last 3 Encounters:  01/17/22 184 lb 9.6 oz (83.7 kg)  01/05/22 178 lb 9.2 oz (81 kg)  01/02/22 180 lb (81.6 kg)      Other studies Reviewed: Additional studies/ records that were reviewed today include:  *** Review of the above records demonstrates:  Please see elsewhere in the note.     ASSESSMENT AND PLAN:   Acute on chronic combined systolic and diastolic CHF ***  He does have a cough that does not seem to be overtly volume overloaded.  I am going to check a BNP level.  I am going to titrate his medications by increasing his carvedilol to 6.25 mg twice a day and increase his  Cozaar to 50 mg daily.   LV thrombus ***  I discussed with him LV thrombus and is getting his Coumadin checked today.  He will continue with anticoagulation probably for about 3 months with a follow-up echo.  tient follow-up recommendations as stated above.   Sinus tachycardia bradyarrhythmia. ***   I think he will tolerate the change in medications and will watch his heart rate.   COPD ***  He can continue with as needed bronchodilators.    Essential hypertension ***  This is being managed in the context of treating his CHF  Polysubstance abuse ***  He says he is not using drugs.  We talked about cigarettes as below.  Tobacco abuse.   ***  He has been educated about the need to stop smoking completely.   Chronic pain syndrome ***  Per his primary team.   Current medicines are reviewed at length with the patient today.  The patient does not have concerns regarding medicines.  The following changes have been made:  ***  Labs/ tests ordered today include:   No orders of the defined types were placed in this encounter.    Disposition:   FU with ***   Signed, Minus Breeding, MD  02/05/2022 9:30 PM    [place]

## 2022-02-07 ENCOUNTER — Ambulatory Visit: Payer: Medicaid Other | Attending: Cardiology | Admitting: Cardiology

## 2022-02-24 ENCOUNTER — Inpatient Hospital Stay (HOSPITAL_COMMUNITY)
Admission: EM | Admit: 2022-02-24 | Discharge: 2022-02-28 | DRG: 291 | Disposition: A | Discharge status: Home / Self Care | Payer: Medicaid Other | Attending: Internal Medicine | Admitting: Internal Medicine

## 2022-02-24 ENCOUNTER — Emergency Department (HOSPITAL_COMMUNITY): Payer: Medicaid Other

## 2022-02-24 ENCOUNTER — Other Ambulatory Visit: Payer: Self-pay

## 2022-02-24 DIAGNOSIS — I5023 Acute on chronic systolic (congestive) heart failure: Secondary | ICD-10-CM | POA: Diagnosis present

## 2022-02-24 DIAGNOSIS — I252 Old myocardial infarction: Secondary | ICD-10-CM

## 2022-02-24 DIAGNOSIS — Z91148 Patient's other noncompliance with medication regimen for other reason: Secondary | ICD-10-CM

## 2022-02-24 DIAGNOSIS — Z5900 Homelessness unspecified: Secondary | ICD-10-CM

## 2022-02-24 DIAGNOSIS — Z7982 Long term (current) use of aspirin: Secondary | ICD-10-CM

## 2022-02-24 DIAGNOSIS — I5042 Chronic combined systolic (congestive) and diastolic (congestive) heart failure: Secondary | ICD-10-CM | POA: Diagnosis present

## 2022-02-24 DIAGNOSIS — I2729 Other secondary pulmonary hypertension: Secondary | ICD-10-CM | POA: Diagnosis present

## 2022-02-24 DIAGNOSIS — G629 Polyneuropathy, unspecified: Secondary | ICD-10-CM | POA: Diagnosis present

## 2022-02-24 DIAGNOSIS — I130 Hypertensive heart and chronic kidney disease with heart failure and stage 1 through stage 4 chronic kidney disease, or unspecified chronic kidney disease: Principal | ICD-10-CM | POA: Diagnosis present

## 2022-02-24 DIAGNOSIS — I5082 Biventricular heart failure: Secondary | ICD-10-CM | POA: Diagnosis present

## 2022-02-24 DIAGNOSIS — Z8249 Family history of ischemic heart disease and other diseases of the circulatory system: Secondary | ICD-10-CM

## 2022-02-24 DIAGNOSIS — N1831 Chronic kidney disease, stage 3a: Secondary | ICD-10-CM | POA: Diagnosis present

## 2022-02-24 DIAGNOSIS — Z720 Tobacco use: Secondary | ICD-10-CM | POA: Diagnosis present

## 2022-02-24 DIAGNOSIS — I513 Intracardiac thrombosis, not elsewhere classified: Secondary | ICD-10-CM | POA: Diagnosis present

## 2022-02-24 DIAGNOSIS — Z888 Allergy status to other drugs, medicaments and biological substances status: Secondary | ICD-10-CM

## 2022-02-24 DIAGNOSIS — Z79899 Other long term (current) drug therapy: Secondary | ICD-10-CM

## 2022-02-24 DIAGNOSIS — I428 Other cardiomyopathies: Secondary | ICD-10-CM | POA: Diagnosis present

## 2022-02-24 DIAGNOSIS — I440 Atrioventricular block, first degree: Secondary | ICD-10-CM | POA: Diagnosis present

## 2022-02-24 DIAGNOSIS — F419 Anxiety disorder, unspecified: Secondary | ICD-10-CM | POA: Diagnosis present

## 2022-02-24 DIAGNOSIS — Z87891 Personal history of nicotine dependence: Secondary | ICD-10-CM

## 2022-02-24 DIAGNOSIS — N179 Acute kidney failure, unspecified: Secondary | ICD-10-CM | POA: Diagnosis present

## 2022-02-24 DIAGNOSIS — F1410 Cocaine abuse, uncomplicated: Secondary | ICD-10-CM | POA: Diagnosis present

## 2022-02-24 DIAGNOSIS — I509 Heart failure, unspecified: Secondary | ICD-10-CM

## 2022-02-24 DIAGNOSIS — F32A Depression, unspecified: Secondary | ICD-10-CM | POA: Diagnosis present

## 2022-02-24 DIAGNOSIS — E785 Hyperlipidemia, unspecified: Secondary | ICD-10-CM | POA: Diagnosis present

## 2022-02-24 DIAGNOSIS — J4489 Other specified chronic obstructive pulmonary disease: Secondary | ICD-10-CM | POA: Diagnosis present

## 2022-02-24 DIAGNOSIS — J449 Chronic obstructive pulmonary disease, unspecified: Secondary | ICD-10-CM | POA: Diagnosis present

## 2022-02-24 DIAGNOSIS — Z7901 Long term (current) use of anticoagulants: Secondary | ICD-10-CM

## 2022-02-24 LAB — CBC WITH DIFFERENTIAL/PLATELET
Abs Immature Granulocytes: 0.01 10*3/uL (ref 0.00–0.07)
Basophils Absolute: 0.1 10*3/uL (ref 0.0–0.1)
Basophils Relative: 2 %
Eosinophils Absolute: 0.1 10*3/uL (ref 0.0–0.5)
Eosinophils Relative: 3 %
HCT: 49.6 % (ref 39.0–52.0)
Hemoglobin: 16 g/dL (ref 13.0–17.0)
Immature Granulocytes: 0 %
Lymphocytes Relative: 38 %
Lymphs Abs: 1.4 10*3/uL (ref 0.7–4.0)
MCH: 28.6 pg (ref 26.0–34.0)
MCHC: 32.3 g/dL (ref 30.0–36.0)
MCV: 88.7 fL (ref 80.0–100.0)
Monocytes Absolute: 0.3 10*3/uL (ref 0.1–1.0)
Monocytes Relative: 8 %
Neutro Abs: 1.8 10*3/uL (ref 1.7–7.7)
Neutrophils Relative %: 49 %
Platelets: 177 10*3/uL (ref 150–400)
RBC: 5.59 MIL/uL (ref 4.22–5.81)
RDW: 15.5 % (ref 11.5–15.5)
WBC: 3.6 10*3/uL — ABNORMAL LOW (ref 4.0–10.5)
nRBC: 0 % (ref 0.0–0.2)

## 2022-02-24 LAB — RAPID URINE DRUG SCREEN, HOSP PERFORMED
Amphetamines: NOT DETECTED
Barbiturates: NOT DETECTED
Benzodiazepines: NOT DETECTED
Cocaine: POSITIVE — AB
Opiates: NOT DETECTED
Tetrahydrocannabinol: NOT DETECTED

## 2022-02-24 LAB — BASIC METABOLIC PANEL
Anion gap: 8 (ref 5–15)
BUN: 19 mg/dL (ref 8–23)
CO2: 23 mmol/L (ref 22–32)
Calcium: 9 mg/dL (ref 8.9–10.3)
Chloride: 102 mmol/L (ref 98–111)
Creatinine, Ser: 1.36 mg/dL — ABNORMAL HIGH (ref 0.61–1.24)
GFR, Estimated: 58 mL/min — ABNORMAL LOW (ref >60)
Glucose, Bld: 113 mg/dL — ABNORMAL HIGH (ref 70–99)
Potassium: 4.1 mmol/L (ref 3.5–5.1)
Sodium: 133 mmol/L — ABNORMAL LOW (ref 135–145)

## 2022-02-24 LAB — BRAIN NATRIURETIC PEPTIDE: B Natriuretic Peptide: 3388.6 pg/mL — ABNORMAL HIGH (ref 0.0–100.0)

## 2022-02-24 MED ORDER — PANTOPRAZOLE SODIUM 40 MG PO TBEC
40.0000 mg | DELAYED_RELEASE_TABLET | Freq: Every day | ORAL | Status: DC
  Administered 2022-02-25 – 2022-02-28 (×4): 40 mg via ORAL
  Filled 2022-02-24 (×4): qty 1

## 2022-02-24 MED ORDER — SODIUM CHLORIDE 0.9% FLUSH
3.0000 mL | Freq: Two times a day (BID) | INTRAVENOUS | Status: DC
  Administered 2022-02-24 – 2022-02-27 (×8): 3 mL via INTRAVENOUS

## 2022-02-24 MED ORDER — CITALOPRAM HYDROBROMIDE 20 MG PO TABS
20.0000 mg | ORAL_TABLET | Freq: Every day | ORAL | Status: DC
  Administered 2022-02-25 – 2022-02-28 (×4): 20 mg via ORAL
  Filled 2022-02-24 (×4): qty 1

## 2022-02-24 MED ORDER — RIVAROXABAN 20 MG PO TABS
20.0000 mg | ORAL_TABLET | Freq: Every day | ORAL | Status: DC
  Administered 2022-02-25 – 2022-02-27 (×3): 20 mg via ORAL
  Filled 2022-02-24 (×4): qty 1

## 2022-02-24 MED ORDER — SODIUM CHLORIDE 0.9% FLUSH: 3.0000 mL | INTRAVENOUS | Status: DC | PRN

## 2022-02-24 MED ORDER — ALBUTEROL SULFATE (2.5 MG/3ML) 0.083% IN NEBU: 3.0000 mL | INHALATION_SOLUTION | Freq: Four times a day (QID) | RESPIRATORY_TRACT | Status: DC | PRN

## 2022-02-24 MED ORDER — GABAPENTIN 400 MG PO CAPS
400.0000 mg | ORAL_CAPSULE | Freq: Every day | ORAL | Status: DC | PRN
  Administered 2022-02-26 – 2022-02-28 (×2): 400 mg via ORAL
  Filled 2022-02-24 (×3): qty 1

## 2022-02-24 MED ORDER — NICOTINE 7 MG/24HR TD PT24
7.0000 mg | MEDICATED_PATCH | Freq: Every day | TRANSDERMAL | Status: DC
  Administered 2022-02-25: 7 mg via TRANSDERMAL
  Filled 2022-02-24 (×5): qty 1

## 2022-02-24 MED ORDER — LOSARTAN POTASSIUM 25 MG PO TABS
25.0000 mg | ORAL_TABLET | Freq: Every day | ORAL | Status: DC
  Administered 2022-02-25 – 2022-02-28 (×4): 25 mg via ORAL
  Filled 2022-02-24 (×4): qty 1

## 2022-02-24 MED ORDER — BENZTROPINE MESYLATE 1 MG PO TABS
1.0000 mg | ORAL_TABLET | Freq: Two times a day (BID) | ORAL | Status: DC
  Administered 2022-02-25 – 2022-02-28 (×7): 1 mg via ORAL
  Filled 2022-02-24 (×9): qty 1

## 2022-02-24 MED ORDER — ASPIRIN 81 MG PO TBEC
81.0000 mg | DELAYED_RELEASE_TABLET | Freq: Every day | ORAL | Status: DC
  Administered 2022-02-25 – 2022-02-28 (×4): 81 mg via ORAL
  Filled 2022-02-24 (×4): qty 1

## 2022-02-24 MED ORDER — FUROSEMIDE 10 MG/ML IJ SOLN
60.0000 mg | Freq: Once | INTRAMUSCULAR | Status: AC
  Administered 2022-02-24: 60 mg via INTRAVENOUS
  Filled 2022-02-24: qty 6

## 2022-02-24 MED ORDER — UMECLIDINIUM BROMIDE 62.5 MCG/ACT IN AEPB
1.0000 | INHALATION_SPRAY | Freq: Every day | RESPIRATORY_TRACT | Status: DC
  Administered 2022-02-25 – 2022-02-28 (×4): 1 via RESPIRATORY_TRACT
  Filled 2022-02-24: qty 7

## 2022-02-24 MED ORDER — ALBUTEROL SULFATE HFA 108 (90 BASE) MCG/ACT IN AERS: 2.0000 | INHALATION_SPRAY | RESPIRATORY_TRACT | Status: DC | PRN

## 2022-02-24 MED ORDER — BENZONATATE 100 MG PO CAPS
100.0000 mg | ORAL_CAPSULE | Freq: Three times a day (TID) | ORAL | Status: DC
  Administered 2022-02-24 – 2022-02-28 (×11): 100 mg via ORAL
  Filled 2022-02-24 (×12): qty 1

## 2022-02-24 MED ORDER — MOMETASONE FURO-FORMOTEROL FUM 200-5 MCG/ACT IN AERO
2.0000 | INHALATION_SPRAY | Freq: Two times a day (BID) | RESPIRATORY_TRACT | Status: DC
  Administered 2022-02-25 – 2022-02-28 (×5): 2 via RESPIRATORY_TRACT
  Filled 2022-02-24: qty 8.8

## 2022-02-24 MED ORDER — FUROSEMIDE 10 MG/ML IJ SOLN
60.0000 mg | Freq: Two times a day (BID) | INTRAMUSCULAR | Status: DC
  Administered 2022-02-24 – 2022-02-26 (×4): 60 mg via INTRAVENOUS
  Filled 2022-02-24 (×4): qty 6

## 2022-02-24 MED ORDER — SODIUM CHLORIDE 0.9 % IV SOLN: 250.0000 mL | INTRAVENOUS | Status: DC | PRN

## 2022-02-24 MED ORDER — ARIPIPRAZOLE 5 MG PO TABS
10.0000 mg | ORAL_TABLET | Freq: Every day | ORAL | Status: DC
  Administered 2022-02-25 – 2022-02-28 (×4): 10 mg via ORAL
  Filled 2022-02-24 (×4): qty 2

## 2022-02-24 MED ORDER — DIGOXIN 125 MCG PO TABS
0.1250 mg | ORAL_TABLET | Freq: Every day | ORAL | Status: DC
  Administered 2022-02-25 – 2022-02-28 (×4): 0.125 mg via ORAL
  Filled 2022-02-24 (×4): qty 1

## 2022-02-24 MED ORDER — ONDANSETRON HCL 4 MG/2ML IJ SOLN: 4.0000 mg | Freq: Four times a day (QID) | INTRAMUSCULAR | Status: DC | PRN

## 2022-02-24 MED ORDER — ATORVASTATIN CALCIUM 80 MG PO TABS
80.0000 mg | ORAL_TABLET | Freq: Every day | ORAL | Status: DC
  Administered 2022-02-25 – 2022-02-28 (×4): 80 mg via ORAL
  Filled 2022-02-24 (×4): qty 1

## 2022-02-24 MED ORDER — ACETAMINOPHEN 325 MG PO TABS
650.0000 mg | ORAL_TABLET | ORAL | Status: DC | PRN
  Administered 2022-02-26 – 2022-02-28 (×3): 650 mg via ORAL
  Filled 2022-02-24 (×4): qty 2

## 2022-02-24 MED ORDER — DM-GUAIFENESIN ER 30-600 MG PO TB12
1.0000 | ORAL_TABLET | Freq: Two times a day (BID) | ORAL | Status: DC
  Administered 2022-02-24 – 2022-02-28 (×8): 1 via ORAL
  Filled 2022-02-24 (×9): qty 1

## 2022-02-24 NOTE — ED Notes (Signed)
PT requesting coffee and something to eat. Dr. Doran Durand aware. PT provided coffee and sandwich bag lunch. Pt given urinals following Lasix administration. Pt on portable monitoring.

## 2022-02-24 NOTE — ED Notes (Signed)
Pt family at bedside. Pt eating food brought by family and requesting coke and ice to drink. PT and pt family educated on importance of pt monitoring fluid intake and amount of salt pt consumes. Will continue to monitor pt.

## 2022-02-24 NOTE — ED Provider Triage Note (Signed)
Emergency Medicine Provider Triage Evaluation Note  Allen Mayer , a 61 y.o. male  was evaluated in triage.  Pt complains of SOB. Increased sob and fluid retention to BLE x 3 days.  Ran out of his Lasix for "a while".  No fever, chest, pain, worsening cough  Review of Systems  Positive: As above Negative: As above  Physical Exam  BP (!) 136/100 (BP Location: Right Arm)   Pulse 88   Temp (!) 97.2 F (36.2 C) (Oral)   Resp (!) 22   Ht 6' (1.829 m)   Wt 83.9 kg   SpO2 99%   BMI 25.09 kg/m  Gen:   Awake, no distress   Resp:  Normal effort  MSK:   Moves extremities without difficulty  Other:    Medical Decision Making  Medically screening exam initiated at 6:46 AM.  Appropriate orders placed.  Allen Mayer was informed that the remainder of the evaluation will be completed by another provider, this initial triage assessment does not replace that evaluation, and the importance of remaining in the ED until their evaluation is complete.     Allen Helper, PA-C 02/24/22 [phone]

## 2022-02-24 NOTE — ED Notes (Signed)
Awaiting home medication verification from pharmacy.

## 2022-02-24 NOTE — ED Triage Notes (Signed)
Pt c/o increasing SOB x3 days along with bilateral leg swelling, cough, and chills. Pt endorses missing two doses of lasix.

## 2022-02-24 NOTE — ED Provider Notes (Signed)
[facility] EMERGENCY DEPARTMENT Provider Note   CSN: [id number] Arrival date & time: 02/24/22  6433     History Chief Complaint  Patient presents with   Allen Mayer    HPI Archer Moist is a 61 y.o. male presenting for bilateral lower extremity Mayer, Allen Mayer, Allen Mayer, Allen Mayer, Allen Mayer, Allen Mayer time.  He states that he stopped taking his Lasix.  He does not know why.  He denies running out of it.  Patient's recorded medical, surgical, social, medication list and allergies were reviewed in the Snapshot window as part of the initial history.   Review of Systems   Review of Systems  Constitutional:  Positive for Allen Mayer. Negative for Mayer and fever.  HENT:  Negative for ear pain and sore throat.   Eyes:  Negative for pain and visual disturbance.  Respiratory:  Positive for Allen Mayer. Negative for cough.   Cardiovascular:  Positive for leg Mayer. Negative for Allen pain and palpitations.  Gastrointestinal:  Negative for abdominal pain and Mayer.  Genitourinary:  Negative for dysuria and hematuria.  Musculoskeletal:  Negative for arthralgias and back pain.  Skin:  Negative for color change and rash.  Neurological:  Negative for seizures and syncope.  All other systems reviewed and are negative.   Physical Exam Updated Vital Signs BP (!) 154/98   Pulse 86   Temp 98.6 F (37 C) (Oral)   Resp 16   Ht 6' (1.829 m)   Wt 83.9 kg   SpO2 100%   BMI 25.09 kg/m  Physical Exam Vitals and nursing note reviewed.  Constitutional:      General: He is not in acute distress.    Appearance: He is well-developed.  HENT:     Head: Normocephalic and atraumatic.  Eyes:     Conjunctiva/sclera: Conjunctivae normal.  Cardiovascular:     Rate and Rhythm: Normal rate and regular rhythm.     Heart sounds: No murmur heard. Pulmonary:      Effort: Pulmonary effort is normal. No respiratory distress.     Mayer sounds: Rales present. No rhonchi.  Abdominal:     Palpations: Abdomen is soft.     Tenderness: There is no abdominal tenderness.  Musculoskeletal:        General: No Mayer.     Cervical back: Neck supple.     Right lower leg: Edema present.     Left lower leg: Edema present.  Skin:    General: Skin is warm and dry.     Capillary Refill: Capillary refill takes less than 2 seconds.  Neurological:     Mental Status: He is alert.  Psychiatric:        Mood and Affect: Mood normal.      ED Course/ Medical Decision Making/ A&P Clinical Course as of 02/24/22 1527  Thu Feb 24, 2022  0955 CHF Exacerbation. Likely Admit. Maybe lasix, and DC if on RA and sats ok. Needs reassess [CC]    Clinical Course User Index [CC] Glyn Ade, MD    Procedures Procedures   Medications Ordered in ED Medications  albuterol (VENTOLIN HFA) 108 (90 Base) MCG/ACT inhaler 2 puff (has no administration in time range)  furosemide (LASIX) injection 60 mg (60 mg Intravenous Given 02/24/22 1355)    Medical Decision Making:    Atanacio Melnyk is a 61 y.o. male who presented to the ED today  with Allen Mayer, bilateral lower extremity Mayer detailed above.     Patient's presentation is complicated by their history of multiple comorbid medical conditions.  Patient placed on continuous vitals and telemetry monitoring while in ED which was reviewed periodically.   Complete initial physical exam performed, notably the patient  was with substantial pitting edema.  He gets very dyspneic on exertion..      Reviewed and confirmed nursing documentation for past medical history, family history, social history.    Initial Assessment:   With the patient's presentation of Allen Mayer, leg Mayer, Allen Mayer, most likely diagnosis is heart failure exacerbation. Other diagnoses were considered including (but not limited  to) pneumonia, pneumothorax, pulmonary embolism, ACS. These are considered less likely due to history of present illness and physical exam findings.   Mayer is most consistent with an acute life/limb threatening illness complicated by underlying chronic conditions.  Initial Plan:  Screening labs including CBC and Metabolic panel to evaluate for infectious or metabolic etiology of disease.  CXR to evaluate for structural/infectious intrathoracic pathology.  BNP/EKG to evaluate for cardiac pathology. Objective evaluation as below reviewed with plan for close reassessment  Initial Study Results:   Laboratory  All laboratory results reviewed without evidence of clinically relevant pathology.     EKG EKG was reviewed independently. Rate, rhythm, axis, intervals all examined and without medically relevant abnormality. ST segments without concerns for elevations.    Radiology  All images reviewed independently. Agree with radiology report at Mayer time.   DG Allen 2 View  Result Date: 02/24/2022 CLINICAL DATA:  Allen Mayer EXAM: Allen - 2 VIEW COMPARISON:  CXR 01/18/22 FINDINGS: No pleural effusion. No pneumothorax. Cardiomegaly. Unchanged mediastinal contours. No displaced rib fractures. No focal airspace opacity. Mildly prominent interstitial opacities suggestive of pulmonary venous congestion. No overt pulmonary edema. Visualized upper abdomen unremarkable. Vertebral body heights are maintained. IMPRESSION: Cardiomegaly with pulmonary venous congestion. No overt pulmonary edema. Electronically Signed   By: Lorenza Cambridge M.D.   On: 02/24/2022 12:55     Consults:  Case discussed with hospitalist Dr. Chipper Herb.   Final Assessment and Plan:   On reassessment, patient feels he is still unable to ambulate despite 9 hours of observation in the emergency department, IV Lasix.  He is also grossly dyspneic.  Given these findings, his substantial decreased EF, he is likely to require ongoing care and  management.  Discussed with hospitalist who agreed with need for admission.  Patient admitted with no further acute events.   Disposition:   Based on the above findings, I believe Mayer patient is stable for admission.    Patient/family educated about specific findings on our evaluation and explained exact reasons for admission.  Patient/family educated about clinical situation and time was allowed to answer questions.   Admission team communicated with and agreed with need for admission. Patient admitted. Patient ready to move at Mayer time.     Emergency Department Medication Summary:   Medications  albuterol (VENTOLIN HFA) 108 (90 Base) MCG/ACT inhaler 2 puff (has no administration in time range)  furosemide (LASIX) injection 60 mg (60 mg Intravenous Given 02/24/22 1355)         Clinical Impression:  1. Acute on chronic systolic congestive heart failure (HCC)      Admit   Final Clinical Impression(s) / ED Diagnoses Final diagnoses:  Acute on chronic systolic congestive heart failure (HCC)    Rx / DC Orders ED Discharge Orders  None         Glyn Ade, MD 02/24/22 1529

## 2022-02-24 NOTE — ED Notes (Signed)
Report given and care endorsed to Tiffany RN 

## 2022-02-24 NOTE — H&P (Signed)
History and Physical    Allen Mayer ELF:[id number] DOB: 15-Dec-1960 DOA: 02/24/2022  PCP: Patient, No Pcp Per (Confirm with patient/family/NH records and if not entered, this has to be entered at [facility] point of entry) Patient coming from: Home  I have personally briefly reviewed patient's old medical records in [facility]  Chief Complaint: SOB and leg swelling  HPI: Allen Mayer is a 61 y.o. male with medical history significant of chronic HFrEF with LVEF 10-20% with LV thrombosis on chronic Xarelto, CKD stage II, cigarette smoker, cocaine abuse, COPD, medical noncompliance, HTN, came to for increasing shortness of breath and leg swelling for 3 days.  Confronted patient regarding the question of whether he has been taking his CHF and BP medications, patient insisted that he only missed 1 day of CHF medication this week.  His significant other however reported that patient only takes his medication " sometimes".  For last 3 days, patient developed increasing shortness of breath and leg swelling, he has a dry cough no sputum production no chest pain no fever or chills.  He denied alcohol use, still smokes cigarette " here and there but not much", denied recent cocaine use   ED Course: Pressure significant elevated, no hypoxia no tachycardia.  Chest x-ray showed pulmonary congestion.  Creatinine 1.3 above baseline, patient was given 60 mg IV Lasix and put out > 2000 mL of urine  Review of Systems: As per HPI otherwise 14 point review of systems negative.    Past Medical History:  Diagnosis Date   Acid reflux    Alcohol abuse    Arthritis    Asthma    Back pain    Bronchitis    Chest pain 07/31/2021   CHF (congestive heart failure) (HCC)    CKD (chronic kidney disease)    CKD (chronic kidney disease), stage III (HCC)    Cocaine abuse (HCC)    COPD (chronic obstructive pulmonary disease) (HCC)    History of noncompliance with medical treatment, presenting hazards to health     Homelessness    Hypertension    LV (left ventricular) mural thrombus    Myocardial infarction (HCC)    Neuropathic pain    NICM (nonischemic cardiomyopathy) (HCC) 2019   NSTEMI (non-ST elevated myocardial infarction) (HCC) 07/31/2021   NSVT (nonsustained ventricular tachycardia) (HCC)    Pericardial effusion    Pulmonary hypertension (HCC)    RVF (right ventricular failure) (HCC)    Schizo affective schizophrenia (HCC)     Past Surgical History:  Procedure Laterality Date   I & D EXTREMITY Bilateral 12/14/2020   Procedure: IRRIGATION AND DEBRIDEMENT AND CLOSURE OF LACERTATIONS OF BILATEAL ARMS;  Surgeon: Fritzi Mandes, MD;  Location: [facility] OR;  Service: General;  Laterality: Bilateral;   LAPAROTOMY N/A 12/14/2020   Procedure: EXPLORATORY LAPAROTOMY;  Surgeon: Fritzi Mandes, MD;  Location: [facility] OR;  Service: General;  Laterality: N/A;   None     RIGHT HEART CATH AND CORONARY ANGIOGRAPHY N/A 08/02/2021   Procedure: RIGHT HEART CATH AND CORONARY ANGIOGRAPHY;  Surgeon: Yvonne Kendall, MD;  Location: [facility] INVASIVE CV LAB;  Service: Cardiovascular;  Laterality: N/A;   RIGHT/LEFT HEART CATH AND CORONARY ANGIOGRAPHY N/A 12/13/2017   Procedure: RIGHT/LEFT HEART CATH AND CORONARY ANGIOGRAPHY;  Surgeon: Runell Gess, MD;  Location: [facility] INVASIVE CV LAB;  Service: Cardiovascular;  Laterality: N/A;     reports that he has been smoking cigarettes. He has been smoking an average of .5 packs per day. He  has never used smokeless tobacco. He reports current drug use. Drugs: Marijuana and Cocaine. No history on file for alcohol use.  Allergies  Allergen Reactions   Flexeril [Cyclobenzaprine] Other (See Comments)    Caused cramping and patient did not like the way it made it feel. Requests this medication to NOT be given to him.    Family History  Problem Relation Age of Onset   Heart attack Mother        Died age 19   Heart attack Brother        16     Prior to Admission medications    Medication Sig Start Date End Date Taking? Authorizing Provider  albuterol (VENTOLIN HFA) 108 (90 Base) MCG/ACT inhaler Inhale 1-2 puffs into the lungs every 6 (six) hours as needed for wheezing or shortness of breath. 01/17/22   Rolly Salter, MD  ARIPiprazole (ABILIFY) 10 MG tablet Take 1 tablet (10 mg total) by mouth daily. 01/17/22   Rolly Salter, MD  aspirin EC 81 MG tablet Take 1 tablet (81 mg total) by mouth daily. Swallow whole. 01/17/22   Rolly Salter, MD  atorvastatin (LIPITOR) 80 MG tablet Take 1 tablet (80 mg total) by mouth daily. 01/17/22   Rolly Salter, MD  benzonatate (TESSALON) 100 MG capsule Take 1 capsule (100 mg total) by mouth 3 (three) times daily. 01/17/22   Rolly Salter, MD  benztropine (COGENTIN) 1 MG tablet Take 1 tablet (1 mg total) by mouth 2 (two) times daily. 01/17/22   Rolly Salter, MD  citalopram (CELEXA) 20 MG tablet Take 1 tablet (20 mg total) by mouth daily. 01/17/22   Rolly Salter, MD  dextromethorphan-guaiFENesin [facility] DM) 30-600 MG 12hr tablet Take 1 tablet by mouth 2 (two) times daily. 01/17/22   Rolly Salter, MD  digoxin (LANOXIN) 0.125 MG tablet Take 1 tablet (0.125 mg total) by mouth daily. 01/17/22 02/16/22  Rolly Salter, MD  furosemide (LASIX) 40 MG tablet Take 1 tablet (40 mg total) by mouth daily. 01/17/22 01/17/23  Rolly Salter, MD  gabapentin (NEURONTIN) 400 MG capsule Take 1 capsule (400 mg total) by mouth daily as needed (nerve pain). 12/20/21   Geoffery Lyons, MD  losartan (COZAAR) 25 MG tablet Take 1 tablet (25 mg total) by mouth daily. 01/17/22 02/16/22  Rolly Salter, MD  mometasone-formoterol (DULERA) 200-5 MCG/ACT AERO Inhale 2 puffs into the lungs 2 (two) times daily. 01/17/22   Rolly Salter, MD  nitroGLYCERIN (NITROSTAT) 0.4 MG SL tablet Place 1 tablet (0.4 mg total) under the tongue every 5 (five) minutes as needed for chest pain. 10/15/21   Rollene Rotunda, MD  pantoprazole (PROTONIX) 40 MG tablet Take 1 tablet (40 mg  total) by mouth daily. 01/17/22   Rolly Salter, MD  rivaroxaban (XARELTO) 20 MG TABS tablet Take 1 tablet (20 mg total) by mouth daily with supper. 01/17/22   Rolly Salter, MD  umeclidinium bromide (INCRUSE ELLIPTA) 62.5 MCG/ACT AEPB Inhale 1 puff into the lungs daily. 01/18/22   Rolly Salter, MD    Physical Exam: Vitals:   02/24/22 0637 02/24/22 0953 02/24/22 1248 02/24/22 1515  BP:  (!) 116/92 (!) 139/91 (!) 154/98  Pulse:  72 70 86  Resp:  16 17 16   Temp:  98.2 F (36.8 C) 97.6 F (36.4 C) 98.6 F (37 C)  TempSrc:  Oral  Oral  SpO2:  100% 95% 100%  Weight: 83.9 kg  Height: 6' (1.829 m)       Constitutional: NAD, calm, comfortable Vitals:   02/24/22 0637 02/24/22 0953 02/24/22 1248 02/24/22 1515  BP:  (!) 116/92 (!) 139/91 (!) 154/98  Pulse:  72 70 86  Resp:  16 17 16   Temp:  98.2 F (36.8 C) 97.6 F (36.4 C) 98.6 F (37 C)  TempSrc:  Oral  Oral  SpO2:  100% 95% 100%  Weight: 83.9 kg     Height: 6' (1.829 m)      Eyes: PERRL, lids and conjunctivae normal ENMT: Mucous membranes are moist. Posterior pharynx clear of any exudate or lesions.Normal dentition.  Neck: normal, supple, no masses, no thyromegaly Respiratory: clear to auscultation bilaterally, no wheezing, fine crackles on bilateral lower fields, increasing breathing effort. No accessory muscle use.  Cardiovascular: Regular rate and rhythm, no murmurs / rubs / gallops.  Anasarca to bilateral upper thighs. 2+ pedal pulses. No carotid bruits.  Abdomen: no tenderness, no masses palpated. No hepatosplenomegaly. Bowel sounds positive.  Musculoskeletal: no clubbing / cyanosis. No joint deformity upper and lower extremities. Good ROM, no contractures. Normal muscle tone.  Skin: no rashes, lesions, ulcers. No induration Neurologic: CN 2-12 grossly intact. Sensation intact, DTR normal. Strength 5/5 in all 4.  Psychiatric: Normal judgment and insight. Alert and oriented x 3. Normal mood.     Labs on Admission:  I have personally reviewed following labs and imaging studies  CBC: Recent Labs  Lab 02/24/22 0702  WBC 3.6*  NEUTROABS 1.8  HGB 16.0  HCT 49.6  MCV 88.7  PLT 177   Basic Metabolic Panel: Recent Labs  Lab 02/24/22 0702  NA 133*  K 4.1  CL 102  CO2 23  GLUCOSE 113*  BUN 19  CREATININE 1.36*  CALCIUM 9.0   GFR: Estimated Creatinine Clearance: 61 mL/min (A) (by C-G formula based on SCr of 1.36 mg/dL (H)). Liver Function Tests: No results for input(s): "AST", "ALT", "ALKPHOS", "BILITOT", "PROT", "ALBUMIN" in the last 168 hours. No results for input(s): "LIPASE", "AMYLASE" in the last 168 hours. No results for input(s): "AMMONIA" in the last 168 hours. Coagulation Profile: No results for input(s): "INR", "PROTIME" in the last 168 hours. Cardiac Enzymes: No results for input(s): "CKTOTAL", "CKMB", "CKMBINDEX", "TROPONINI" in the last 168 hours. BNP (last 3 results) No results for input(s): "PROBNP" in the last 8760 hours. HbA1C: No results for input(s): "HGBA1C" in the last 72 hours. CBG: No results for input(s): "GLUCAP" in the last 168 hours. Lipid Profile: No results for input(s): "CHOL", "HDL", "LDLCALC", "TRIG", "CHOLHDL", "LDLDIRECT" in the last 72 hours. Thyroid Function Tests: No results for input(s): "TSH", "T4TOTAL", "FREET4", "T3FREE", "THYROIDAB" in the last 72 hours. Anemia Panel: No results for input(s): "VITAMINB12", "FOLATE", "FERRITIN", "TIBC", "IRON", "RETICCTPCT" in the last 72 hours. Urine analysis:    Component Value Date/Time   COLORURINE YELLOW 01/05/2022 2155   APPEARANCEUR CLEAR 01/05/2022 2155   LABSPEC 1.016 01/05/2022 2155   PHURINE 5.0 01/05/2022 2155   GLUCOSEU NEGATIVE 01/05/2022 2155   HGBUR NEGATIVE 01/05/2022 2155   BILIRUBINUR NEGATIVE 01/05/2022 2155   KETONESUR NEGATIVE 01/05/2022 2155   PROTEINUR 100 (A) 01/05/2022 2155   UROBILINOGEN 1.0 01/31/2012 0445   NITRITE NEGATIVE 01/05/2022 2155   LEUKOCYTESUR NEGATIVE 01/05/2022  2155    Radiological Exams on Admission: DG Chest 2 View  Result Date: 02/24/2022 CLINICAL DATA:  Shortness of breath EXAM: CHEST - 2 VIEW COMPARISON:  CXR 01/18/22 FINDINGS: No pleural effusion. No pneumothorax. Cardiomegaly. Unchanged  mediastinal contours. No displaced rib fractures. No focal airspace opacity. Mildly prominent interstitial opacities suggestive of pulmonary venous congestion. No overt pulmonary edema. Visualized upper abdomen unremarkable. Vertebral body heights are maintained. IMPRESSION: Cardiomegaly with pulmonary venous congestion. No overt pulmonary edema. Electronically Signed   By: Lorenza Cambridge M.D.   On: 02/24/2022 12:55    EKG: Independently reviewed.  Sinus rhythm, incomplete LBBB  Assessment/Plan Principal Problem:   CHF (congestive heart failure) (HCC) Active Problems:   Chronic combined systolic and diastolic CHF (congestive heart failure) (HCC)  (please populate well all problems here in Problem List. (For example, if patient is on BP meds at home and you resume or decide to hold them, it is a problem that needs to be her. Same for CAD, COPD, HLD and so on)  Acute on chronic HF R EF decompensation -Secondary to noncompliant with CHF/BP medications, although there is a question of ongoing cocaine use -Continue high-dose IV diuresis, expect patient volume status will significantly improved after 1-2 more doses of IV Lasix and likely can discharge home tomorrow.  Follow-up chest x-ray in the morning -Consider start low-dose of Coreg on discharge -Check digoxin level  COPD -No symptoms signs of acute exacerbation continue as needed breathing medications  CKD stage II -Creatinine level stable, K4.1, continue losartan  Chronic anxiety depression, neuropathy -Continue gabapentin Cogentin Celexa  History of with thrombosis -Continue Xarelto  DVT prophylaxis: Xarelto Code Status: Full code Family Communication: Significant other at bedside Disposition  Plan: Expect less than 2 midnight hospital stay Consults called: None Admission status: Telemetry observation   Emeline General MD [facility] Pager [phone]  02/24/2022, 4:04 PM

## 2022-02-25 ENCOUNTER — Encounter (HOSPITAL_COMMUNITY): Payer: Self-pay | Admitting: Internal Medicine

## 2022-02-25 ENCOUNTER — Observation Stay (HOSPITAL_COMMUNITY): Payer: Medicaid Other

## 2022-02-25 DIAGNOSIS — G629 Polyneuropathy, unspecified: Secondary | ICD-10-CM | POA: Diagnosis present

## 2022-02-25 DIAGNOSIS — J431 Panlobular emphysema: Secondary | ICD-10-CM | POA: Diagnosis not present

## 2022-02-25 DIAGNOSIS — N179 Acute kidney failure, unspecified: Secondary | ICD-10-CM | POA: Diagnosis present

## 2022-02-25 DIAGNOSIS — F1410 Cocaine abuse, uncomplicated: Secondary | ICD-10-CM

## 2022-02-25 DIAGNOSIS — I5023 Acute on chronic systolic (congestive) heart failure: Secondary | ICD-10-CM | POA: Diagnosis present

## 2022-02-25 DIAGNOSIS — I428 Other cardiomyopathies: Secondary | ICD-10-CM | POA: Diagnosis present

## 2022-02-25 DIAGNOSIS — N1831 Chronic kidney disease, stage 3a: Secondary | ICD-10-CM | POA: Diagnosis present

## 2022-02-25 DIAGNOSIS — I513 Intracardiac thrombosis, not elsewhere classified: Secondary | ICD-10-CM | POA: Diagnosis present

## 2022-02-25 DIAGNOSIS — Z7901 Long term (current) use of anticoagulants: Secondary | ICD-10-CM | POA: Diagnosis not present

## 2022-02-25 DIAGNOSIS — I5082 Biventricular heart failure: Secondary | ICD-10-CM | POA: Diagnosis present

## 2022-02-25 DIAGNOSIS — I130 Hypertensive heart and chronic kidney disease with heart failure and stage 1 through stage 4 chronic kidney disease, or unspecified chronic kidney disease: Secondary | ICD-10-CM | POA: Diagnosis present

## 2022-02-25 DIAGNOSIS — Z79899 Other long term (current) drug therapy: Secondary | ICD-10-CM | POA: Diagnosis not present

## 2022-02-25 DIAGNOSIS — Z7982 Long term (current) use of aspirin: Secondary | ICD-10-CM | POA: Diagnosis not present

## 2022-02-25 DIAGNOSIS — Z8249 Family history of ischemic heart disease and other diseases of the circulatory system: Secondary | ICD-10-CM | POA: Diagnosis not present

## 2022-02-25 DIAGNOSIS — E785 Hyperlipidemia, unspecified: Secondary | ICD-10-CM | POA: Diagnosis present

## 2022-02-25 DIAGNOSIS — J4489 Other specified chronic obstructive pulmonary disease: Secondary | ICD-10-CM | POA: Diagnosis present

## 2022-02-25 DIAGNOSIS — F419 Anxiety disorder, unspecified: Secondary | ICD-10-CM | POA: Diagnosis present

## 2022-02-25 DIAGNOSIS — I252 Old myocardial infarction: Secondary | ICD-10-CM | POA: Diagnosis not present

## 2022-02-25 DIAGNOSIS — F32A Depression, unspecified: Secondary | ICD-10-CM | POA: Diagnosis present

## 2022-02-25 DIAGNOSIS — I509 Heart failure, unspecified: Secondary | ICD-10-CM | POA: Diagnosis present

## 2022-02-25 DIAGNOSIS — Z87891 Personal history of nicotine dependence: Secondary | ICD-10-CM | POA: Diagnosis not present

## 2022-02-25 DIAGNOSIS — Z91148 Patient's other noncompliance with medication regimen for other reason: Secondary | ICD-10-CM | POA: Diagnosis not present

## 2022-02-25 DIAGNOSIS — Z888 Allergy status to other drugs, medicaments and biological substances status: Secondary | ICD-10-CM | POA: Diagnosis not present

## 2022-02-25 DIAGNOSIS — I5020 Unspecified systolic (congestive) heart failure: Secondary | ICD-10-CM | POA: Insufficient documentation

## 2022-02-25 DIAGNOSIS — Z5900 Homelessness unspecified: Secondary | ICD-10-CM | POA: Diagnosis not present

## 2022-02-25 DIAGNOSIS — I2729 Other secondary pulmonary hypertension: Secondary | ICD-10-CM | POA: Diagnosis present

## 2022-02-25 LAB — BASIC METABOLIC PANEL
Anion gap: 11 (ref 5–15)
BUN: 16 mg/dL (ref 8–23)
CO2: 26 mmol/L (ref 22–32)
Calcium: 8.9 mg/dL (ref 8.9–10.3)
Chloride: 104 mmol/L (ref 98–111)
Creatinine, Ser: 1.33 mg/dL — ABNORMAL HIGH (ref 0.61–1.24)
GFR, Estimated: >60 mL/min (ref >60)
Glucose, Bld: 115 mg/dL — ABNORMAL HIGH (ref 70–99)
Potassium: 3.7 mmol/L (ref 3.5–5.1)
Sodium: 141 mmol/L (ref 135–145)

## 2022-02-25 LAB — DIGOXIN LEVEL: Digoxin Level: <0.2 ng/mL — ABNORMAL LOW (ref 0.8–2.0)

## 2022-02-25 MED ORDER — SPIRONOLACTONE 12.5 MG HALF TABLET
12.5000 mg | ORAL_TABLET | Freq: Every day | ORAL | Status: DC
  Administered 2022-02-25 – 2022-02-28 (×4): 12.5 mg via ORAL
  Filled 2022-02-25 (×4): qty 1

## 2022-02-25 MED ORDER — ORAL CARE MOUTH RINSE: 15.0000 mL | OROMUCOSAL | Status: DC | PRN

## 2022-02-25 NOTE — Assessment & Plan Note (Addendum)
Echocardiogram with reduced LV systolic function to < 20% with global hypokinesis, RV with sever reduction in systolic function, LA and RA with moderate dilatation, with no significant valvular disease.   Urine output is 8,850 cc Systolic blood pressure 132 mmHg.   Continue medical therapy with Losartan and digoxin  On spironolactone and add SGLT 2 ing Hold pm dose of furosemide for now.  Add TED to lower extremities.

## 2022-02-25 NOTE — Hospital Course (Signed)
Allen Mayer was admitted to the hospital with the working diagnosis of decompensated heart failure.   61 yo male with the past medical history of heart failure with reduced systolic function, LV thrombus, CKD, COPD, and hypertension who presented with dyspnea and lower extremity edema for 3 days, associated with dry cough. Apparently patient has not been compliant with his medications at home On his initial physical examination his blood pressure was 116/92, HR 72, RR 16, 02 saturation 95%, lungs with rales bilaterally, with increased work of breathing, heart with S1 and S2 present and rhythmic, abdomen with no distention, positive upper and lower extremity edema.   Na 136, K 4.1 Cl 102, bicarbonate 23, glucose 113, bun 19 cr 1,36  BNP 3,388  Wbc 3,6 hgb 16.0 plt 177  Toxicology screen positive for cocaine   Chest radiograph with cardiomegaly with bilateral interstitial infiltrates, peribronchial cuffing with no effusions.  EKG 86 bpm, left axis deviation, qtc not prolonged, sinus with 1st degree AV block type 2, q wave lead II, III, AvF, V1 to V3, with no significant ST segment or T wave changes.   11/18 patient is responding well to diuresis with IV furosemide.  11/19 clinically improving, transitioned to oral furosemide 11/20 patient will be discharge on torsemide, spironolactone and SGLT 2 inh for diuresis, will need close follow up as outpatient.

## 2022-02-25 NOTE — Progress Notes (Signed)
   Heart Failure Stewardship Pharmacist Progress Note   PCP: Patient, No Pcp Per PCP-Cardiologist: None    HPI:   61 y.o. male with PMH significant for HTN, COPD, HFrEF, polysubstance use disorder, schizophrenia, and LV thrombus. He presented to the ED on 11/16 with complaints of bilateral LEE, SOB, fatigue, and inability to ambulate d/t leg pain. He reports missing 2 doses of his Lasix. He denies current cocaine use.   EF dating back to 2019 with an EF of 25-30%. He has been in and out of the ED multiple times this year. His most recent hospitalization for HF exacerbation was 10/6-10/12/2021. Most recent ECHO on 11/02/2021 with EF <20%, global hypokinesis, moderate dilation of the LV, and mild RV dilation. Right heart cath on 08/02/2021 demonstrated non-obstructive CAD, normal l/r heart filling pressures, and moderately reduced cardiac output/index. CXR on 11/16 showed cardiomegaly with pulmonary venous congestion.  He endorses medication adherence, however, he pulled out medication bottles from his previous discharge that were barely utilized. Additionally, he was eating a bag of chips when I went to see him this afternoon with a bag on his bed full of multiple snack size chip bags.   Current HF Medications: Diuretic: Lasix 60mg  IV BID ACE/ARB/ARNI: losartan 25mg  daily Other: digoxin 0.125mg  daily  Prior to admission HF Medications: Diuretic: furosemide 40mg  once daily ACE/ARB/ARNI: losartan 25mg  once daily Other: digoxin 0.125mg  once daily  Pertinent Lab Values: Serum creatinine 1.33, BUN 16, Potassium 3.7, Sodium 141, BNP 3,388.6, Magnesium 1.8 (01/15/2022), A1c 6.0 (12/14/2020), Digoxin <0.2   Vital Signs: Weight: 176 lbs (admission weight: 185 lbs) Blood pressure: 120-140s/90-100s Heart rate: 70-90s  I/O: -3.225L yesterday; net -5.6L  Medication Assistance / Insurance Benefits Check: Does the patient have prescription insurance?  Yes Type of insurance plan: [place]  Medicaid  Outpatient Pharmacy:  Prior to admission outpatient pharmacy: Pharmacy Is the patient willing to use [facility] TOC pharmacy at discharge? Pending Is the patient willing to transition their outpatient pharmacy to utilize a [facility] outpatient pharmacy?   Pending    Assessment: 1. Acute on chronic systolic CHF (LVEF <20%), due to polysubstance abuse. NYHA class III-IV symptoms. - Previously on carvedilol therapy. Consider reinitiating once euvolemic  - Consider switching current RAAS therapy for ARNI therapy at this time. - Consider SGLT2i therapy at this time. - Would benefit from MRA therapy during this hospitalization. - Based on fill history, there is some possible medication non-adherence. Would recommend rechecking a digoxin level in 5 days (11/21) -Continue current diuretic therapy -Keep Mg>2 and K>4. Daily standing weight. Strict I/Os.   Plan: 1) Medication changes recommended at this time: -Discontinue losartan  -Initiate Entresto 24/26mg  BID - Initiate Farxiga 10mg  once daily -Check Mg with AM labs -Check A1c with AM labs  2) Patient assistance: -Has [place] Medicaid insurance -PA required for SGLT2i and Entresto  3)  Education  -Initial education provided - Full education to be completed prior to discharge  Renaee Munda, PharmD PGY-1 [facility] Pharmacy Resident

## 2022-02-25 NOTE — Progress Notes (Addendum)
Progress Note   Patient: Allen Mayer IDP:[id number] DOB: Apr 26, 1960 DOA: 02/24/2022     0 DOS: the patient was seen and examined on 02/25/2022   Brief hospital course: Allen Mayer was admitted to the hospital with the working diagnosis of decompensated heart failure.   61 yo male with the past medical history of heart failure with reduced systolic function, LV thrombus, CKD, COPD, and hypertension who presented with dyspnea and lower extremity edema for 3 days, associated with dry cough. Apparently patient has not been compliant with his medications at home On his initial physical examination his blood pressure was 116/92, HR 72, RR 16, 02 saturation 95%, lungs with rales bilaterally, with increased work of breathing, heart with S1 and S2 present and rhythmic, abdomen with no distention, positive upper and lower extremity edema.   Na 136, K 4.1 Cl 102, bicarbonate 23, glucose 113, bun 19 cr 1,36  BNP 3,388  Wbc 3,6 hgb 16.0 plt 177  Toxicology screen positive for cocaine   Chest radiograph with cardiomegaly with bilateral interstitial infiltrates, peribronchial cuffing with no effusions.  EKG 86 bpm, left axis deviation, qtc not prolonged, sinus with 1st degree AV block type 2, q wave lead II, III, AvF, V1 to V3, with no significant ST segment or T wave changes.     Assessment and Plan: * Acute on chronic systolic CHF (congestive heart failure) (HCC) Echocardiogram with reduced LV systolic function to < 20% with global hypokinesis, RV with sever reduction in systolic function, LA and RA with moderate dilatation, with no significant valvular disease.   Urine output is 5,600 cc Systolic blood pressure 112 to 114 mmHg.   Continue diuresis with IV furosemide 60 mg IV q12 hrs Losartan and digoxin  Add low dose spironolactone   LV (left ventricular) mural thrombus Continue anticoagulation with rivaroxaban   COPD (chronic obstructive pulmonary disease) (HCC) No signs of  exacerbation, continue oxymetry monitoring  Continue bronchodilator therapy.  Tobacco abuse Smoking cessation   Cocaine abuse (HCC) Advised to avoid cocaine   Stage 3a chronic kidney disease (CKD) (HCC) Renal function with serum cr at 1,3 with K at 3,7 and serum bicarbonate at 26. Continue diuresis with furosemide Follow up renal function and electrolytes in am.         Subjective: Patient with improvement in dyspnea but not yet back to baseline, no chest pain   Physical Exam: Vitals:   02/25/22 0403 02/25/22 0749 02/25/22 0816 02/25/22 1634  BP: (!) 140/95     Pulse: 90     Resp: (!) 21     Temp: 97.8 F (36.6 C) 97.7 F (36.5 C)  98 F (36.7 C)  TempSrc: Oral Oral  Oral  SpO2: 100%  100%   Weight: 80.2 kg     Height:       Neurology somnolent but easy to arouse, no confusion or agitation  ENT with mild pallor Cardiovascular with S1 and S2 present and rhythmic with no gallops, rubs or murmurs Positive JVD  Respiratory with scattered rales with no wheezing Abdomen with no distention  Positive lower extremity edema ++ Data Reviewed:    Family Communication: I spoke with patient's fiance at the bedside, we talked in detail about patient's condition, plan of care and prognosis and all questions were addressed.   Disposition: Status is: Inpatient Remains inpatient appropriate because: heart failure   Planned Discharge Destination: Home     Author: Coralie Keens, MD 02/25/2022 5:22 PM  For on call  review [email or web address].

## 2022-02-25 NOTE — Assessment & Plan Note (Signed)
Advised to avoid cocaine

## 2022-02-25 NOTE — Assessment & Plan Note (Signed)
Continue anticoagulation with rivaroxaban 
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
## 2022-02-25 NOTE — Progress Notes (Signed)
Heart Failure Nurse Navigator Progress Note  PCP: Patient, No Pcp Per PCP-Cardiologist: Hoch[place] Admission Diagnosis: Acute on chronic systolic congestive heart failure.  Admitted from: Home  Presentation:   Allen Mayer presented with shortness of breath x 3 days, bilateral leg swelling, cough and chills. (Patient recently hospitalized ( 10/6-10/9/23) for same symptoms.  Patient reported to stopping his lasix. "He doesn't know why". BP 154/98, HR 86, BNP 3,386, IV lasix 60 mg given. CXR shows Cardiomegaly with pulmonary venous congestion.   Navigator attempted to re-educate patient on the sign and symptoms of heart failure, getting his daily weights, diet/ fluid restrictions, taking all his medications as prescribed and attending all medical appointments, and substance cessation. Patient would not interact with Navigator and frequently said, "Yeah, I know". Patient was scheduled for a hospital follow up in HF TOC on 03/08/22 @ 11 am.   ECHO/ LVEF: <20% G3DD  Clinical Course:  Past Medical History:  Diagnosis Date   Acid reflux    Alcohol abuse    Arthritis    Asthma    Back pain    Bronchitis    Chest pain 07/31/2021   CHF (congestive heart failure) (HCC)    CKD (chronic kidney disease)    CKD (chronic kidney disease), stage III (HCC)    Cocaine abuse (HCC)    COPD (chronic obstructive pulmonary disease) (HCC)    History of noncompliance with medical treatment, presenting hazards to health    Homelessness    Hypertension    LV (left ventricular) mural thrombus    Myocardial infarction [facility])    Neuropathic pain    NICM (nonischemic cardiomyopathy) (HCC) 2019   NSTEMI (non-ST elevated myocardial infarction) (HCC) 07/31/2021   NSVT (nonsustained ventricular tachycardia) (HCC)    Pericardial effusion    Pulmonary hypertension (HCC)    RVF (right ventricular failure) (HCC)    Schizo affective schizophrenia (HCC)      Social History   Socioeconomic History   Marital  status: Significant Other    Spouse name: Not on file   Number of children: 4   Years of education: Not on file   Highest education level: High school graduate  Occupational History   Occupation: disability  Tobacco Use   Smoking status: Every Day    Packs/day: 0.50    Types: Cigarettes   Smokeless tobacco: Never  Vaping Use   Vaping Use: Never used  Substance and Sexual Activity   Alcohol use: Not on file    Comment: occasionally   Drug use: Yes    Types: Marijuana, Cocaine    Comment: couple days ago   Sexual activity: Not on file  Other Topics Concern   Not on file  Social History Narrative   ** Merged History Encounter **       Lives with fiance.     Social Determinants of Health   Financial Resource Strain: Medium Risk (01/14/2022)   Overall Financial Resource Strain (CARDIA)    Difficulty of Paying Living Expenses: Somewhat hard  Food Insecurity: Food Insecurity Present (01/16/2022)   Hunger Vital Sign    Worried About Running Out of Food in the Last Year: Sometimes true    Ran Out of Food in the Last Year: Sometimes true  Transportation Needs: No Transportation Needs (01/14/2022)   PRAPARE - Administrator, Civil Service (Medical): No    Lack of Transportation (Non-Medical): No  Physical Activity: Not on file  Stress: Not on file  Social Connections: Not  on file   Education Assessment and Provision:  Detailed education and instructions provided on heart failure disease management including the following:  Signs and symptoms of Heart Failure When to call the physician Importance of daily weights Low sodium diet Fluid restriction Medication management Anticipated future follow-up appointments  Patient education given on each of the above topics.  Patient acknowledges understanding via teach back method and acceptance of all instructions.  Education Materials:  "Living Better With Heart Failure" Booklet, HF zone tool, & Daily Weight Tracker  Tool.  Patient has scale at home: yes, "girlfriend has one" live toether Patient has pill box at home: NA    High Risk Criteria for Readmission and/or Poor Patient Outcomes: Heart failure hospital admissions (last 6 months): 3  No Show rate: 24% Difficult social situation: No Demonstrates medication adherence: No Primary Language: English Literacy level: Reading, writing, and comprehension  Barriers of Care:   Medication/ appointment compliance Substance cessation Diet/ fluid restrictions Daily weights  Considerations/Referrals:   Referral made to Heart Failure Pharmacist Stewardship: yes Referral made to Heart Failure CSW/NCM TOC: NO Referral made to Heart & Vascular TOC clinic: Yes, 03/08/22 @ 11 am  Items for Follow-up on DC/TOC: Medication/ appointment compliance Substance cessation Diet/ fluid restrictions Daily weights   Earnestine Leys, BSN, RN Heart Failure Transport planner Only

## 2022-02-25 NOTE — Assessment & Plan Note (Signed)
Smoking cessation  

## 2022-02-25 NOTE — Assessment & Plan Note (Addendum)
No signs of exacerbation, continue oxymetry monitoring  Continue bronchodilator therapy.

## 2022-02-25 NOTE — Assessment & Plan Note (Addendum)
CKD stage 3a Hypomagnesemia   Patient responded well to diuresis, his Mag was corrected with Mag sulfate IV. At the time of his discharge his serum cr is 1,28 with K at 4,4 and serum bicarbonate at 27. Mg 2,3   Plan to continue diuresis with spironolactone, empagliflozin and torsemide  Follow up renal function as outpatient

## 2022-02-25 NOTE — TOC Progression Note (Signed)
Transition of Care [facility]) - Progression Note    Patient Details  Name: Allen Mayer MRN: [id number] Date of Birth: July 05, 1960  Transition of Care [facility]) CM/SW [place], [place] Phone Number: 02/25/2022, 4:32 PM  Clinical Narrative:     CSW met with pt and pt's significant other bedside. Pt seems disengaged and significant other does most of the talking. CSW speaks directly to patient and he answers some questions. They have concerns about housing/shelter. Pt has been staying on front porches; s/o lives with her parents and pt not able to stay there. Pt has applied to pallet house program but has not heard back yet. CSW discusses shelter options in [place] and [facility]; pt is hesitant initially but agreeable to try them. 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   CSW left room and returned with info for the following: [facility] mens shelter [place]- must be referred by [employer] and [facility](through servant center)  Pt's s/o began following up on these options. Shelter/housing resources are extremely limited and often have poor follow up. May be unable to secure shelter by DC date if relying only on community resources vs friends/family/income.   Expected Discharge Plan: Homeless Shelter Barriers to Discharge: Continued Medical Work up  Expected Discharge Plan and Services Expected Discharge Plan: [place] arrangements for the past 2 months: Homeless                                       Social Determinants of Health (SDOH) Interventions Food Insecurity Interventions: Intervention Not Indicated Housing Interventions: Intervention Not Indicated Transportation Interventions: Intervention Not Indicated Alcohol Usage Interventions: Intervention Not Indicated (Score <7) Financial Strain Interventions: Intervention Not Indicated  Readmission Risk  Interventions    11/04/2021    2:35 PM 12/18/2020   12:36 PM  Readmission Risk Prevention Plan  [place] Appt    Medication Screening    Transportation Screening Complete   Medication Review ([place]) Complete   PCP or Specialist appointment within 3-5 days of discharge Complete   HRI or [place] Complete   SW Recovery Care/Counseling Consult Complete   [place] Not Applicable      Information is confidential and restricted. Go to Review Flowsheets to unlock data.

## 2022-02-25 NOTE — Progress Notes (Signed)
Pt refused scheduled Xarelto and would not give reason why. RN  educated pt on purpose of medication but pt still refused and stated "I only take it sometimes" and then would not discuss further with RN. Night MD notified of refusal.

## 2022-02-25 NOTE — Plan of Care (Signed)
  Problem: Clinical Measurements: Goal: Ability to maintain clinical measurements within normal limits will improve 02/25/2022 0231 by Dorathy Daft, RN Outcome: Progressing 02/25/2022 0231 by Dorathy Daft, RN Outcome: Progressing Goal: Will remain free from infection 02/25/2022 0231 by Dorathy Daft, RN Outcome: Progressing 02/25/2022 0231 by Dorathy Daft, RN Outcome: Progressing Goal: Diagnostic test results will improve 02/25/2022 0231 by Dorathy Daft, RN Outcome: Progressing 02/25/2022 0231 by Dorathy Daft, RN Outcome: Progressing Goal: Respiratory complications will improve 02/25/2022 0231 by Dorathy Daft, RN Outcome: Progressing 02/25/2022 0231 by Dorathy Daft, RN Outcome: Progressing Goal: Cardiovascular complication will be avoided 02/25/2022 0231 by Dorathy Daft, RN Outcome: Progressing 02/25/2022 0231 by Dorathy Daft, RN Outcome: Progressing   Problem: Education: Goal: Ability to demonstrate management of disease process will improve 02/25/2022 0231 by Dorathy Daft, RN Outcome: Not Progressing 02/25/2022 0231 by Dorathy Daft, RN Outcome: Progressing Goal: Ability to verbalize understanding of medication therapies will improve 02/25/2022 0231 by Dorathy Daft, RN Outcome: Not Progressing 02/25/2022 0231 by Dorathy Daft, RN Outcome: Progressing Goal: Individualized Educational Video(s) Outcome: Progressing

## 2022-02-26 ENCOUNTER — Encounter (HOSPITAL_COMMUNITY): Payer: Self-pay | Admitting: Internal Medicine

## 2022-02-26 DIAGNOSIS — N179 Acute kidney failure, unspecified: Secondary | ICD-10-CM

## 2022-02-26 DIAGNOSIS — N189 Chronic kidney disease, unspecified: Secondary | ICD-10-CM

## 2022-02-26 LAB — BASIC METABOLIC PANEL
Anion gap: 12 (ref 5–15)
BUN: 18 mg/dL (ref 8–23)
CO2: 30 mmol/L (ref 22–32)
Calcium: 8.8 mg/dL — ABNORMAL LOW (ref 8.9–10.3)
Chloride: 99 mmol/L (ref 98–111)
Creatinine, Ser: 1.62 mg/dL — ABNORMAL HIGH (ref 0.61–1.24)
GFR, Estimated: 47 mL/min — ABNORMAL LOW (ref >60)
Glucose, Bld: 102 mg/dL — ABNORMAL HIGH (ref 70–99)
Potassium: 3.9 mmol/L (ref 3.5–5.1)
Sodium: 141 mmol/L (ref 135–145)

## 2022-02-26 LAB — MAGNESIUM: Magnesium: 1.5 mg/dL — ABNORMAL LOW (ref 1.7–2.4)

## 2022-02-26 LAB — HEMOGLOBIN A1C
Hgb A1c MFr Bld: 6.2 % — ABNORMAL HIGH (ref 4.8–5.6)
Mean Plasma Glucose: 131.24 mg/dL

## 2022-02-26 MED ORDER — EMPAGLIFLOZIN 10 MG PO TABS
10.0000 mg | ORAL_TABLET | Freq: Every day | ORAL | Status: DC
  Administered 2022-02-26 – 2022-02-28 (×3): 10 mg via ORAL
  Filled 2022-02-26 (×3): qty 1

## 2022-02-26 MED ORDER — MAGNESIUM SULFATE 4 GM/100ML IV SOLN
4.0000 g | Freq: Once | INTRAVENOUS | Status: AC
  Administered 2022-02-26: 4 g via INTRAVENOUS
  Filled 2022-02-26: qty 100

## 2022-02-26 NOTE — Progress Notes (Signed)
Progress Note   Patient: Charlotte Brafford JJH:[id number] DOB: [birth year] DOA: 02/24/2022     1 DOS: the patient was seen and examined on 02/26/2022   Brief hospital course: Mr. Chiappetta was admitted to the hospital with the working diagnosis of decompensated heart failure.   61 yo male with the past medical history of heart failure with reduced systolic function, LV thrombus, CKD, COPD, and hypertension who presented with dyspnea and lower extremity edema for 3 days, associated with dry cough. Apparently patient has not been compliant with his medications at home On his initial physical examination his blood pressure was 116/92, HR 72, RR 16, 02 saturation 95%, lungs with rales bilaterally, with increased work of breathing, heart with S1 and S2 present and rhythmic, abdomen with no distention, positive upper and lower extremity edema.   Na 136, K 4.1 Cl 102, bicarbonate 23, glucose 113, bun 19 cr 1,36  BNP 3,388  Wbc 3,6 hgb 16.0 plt 177  Toxicology screen positive for cocaine   Chest radiograph with cardiomegaly with bilateral interstitial infiltrates, peribronchial cuffing with no effusions.  EKG 86 bpm, left axis deviation, qtc not prolonged, sinus with 1st degree AV block type 2, q wave lead II, III, AvF, V1 to V3, with no significant ST segment or T wave changes.   11/18 patient is responding well to diuresis with IV furosemide.   Assessment and Plan: * Acute on chronic systolic CHF (congestive heart failure) (HCC) Echocardiogram with reduced LV systolic function to < 20% with global hypokinesis, RV with sever reduction in systolic function, LA and RA with moderate dilatation, with no significant valvular disease.   Urine output is 8,850 cc Systolic blood pressure 132 mmHg.   Continue medical therapy with Losartan and digoxin  On spironolactone and add SGLT 2 ing Hold pm dose of furosemide for now.  Add TED to lower extremities.   LV (left ventricular) mural thrombus Continue  anticoagulation with rivaroxaban   COPD (chronic obstructive pulmonary disease) (HCC) No signs of exacerbation, continue oxymetry monitoring  Continue bronchodilator therapy.  Tobacco abuse Smoking cessation   Cocaine abuse (HCC) Advised to avoid cocaine   Acute kidney injury superimposed on chronic kidney disease (HCC) CKD stage 3a Hypomagnesemia   Patient is responding well to diuresis, renal function with serum cr at  1.62 with K at 3,9 and serum bicarbonate at 30. Mg is 1,5   Plan to continue diuresis with SGLT 2 inh, and spironolactone Will hold on PM dose of furosemide due to increased serum cr Follow up renal function in am, avoid hypotension and nephrotoxic medications Add 4 g mag sulfate IV        Subjective: Patient is feeling better, dyspnea and lower extremity edema have improved but not back to baseline,  Physical Exam: Vitals:   02/25/22 1913 02/25/22 2012 02/26/22 0422 02/26/22 0732  BP: 132/88  132/87   Pulse: 79  93 90  Resp: 20  19 16   Temp: 98.2 F (36.8 C)  97.8 F (36.6 C)   TempSrc: Oral  Oral   SpO2: 95% 92% 94%   Weight:   75.8 kg   Height:       Neurology awake and alert ENT with no pallor Cardiovascular with S1 and S2 present and rhythmic with no gallops or murmurs Mild JVD Respiratory with no rales or wheezing Abdomen with no distention Trace lower extremity edema  Data Reviewed:    Family Communication: no family at the bedside   Disposition: Status is:  Inpatient Remains inpatient appropriate because: heart failure   Planned Discharge Destination: Home     Author: Coralie Keens, MD 02/26/2022 12:29 PM  For on call review [email or web address].

## 2022-02-26 NOTE — Progress Notes (Signed)
   02/26/22 1600  Mobility  Activity Ambulated with assistance in room  Level of Assistance Contact guard assist, steadying assist  Assistive Device Front wheel walker  Distance Ambulated (ft) 5 ft  Activity Response Tolerated well  Mobility Referral Yes  $Mobility charge 1 Mobility   Mobility Specialist Progress Note  Pt was in bed and agreeable. Mobility cut short d/t c/o foot pain. Rn notified and in room.   Anastasia Pall Mobility Specialist

## 2022-02-27 DIAGNOSIS — Z720 Tobacco use: Secondary | ICD-10-CM

## 2022-02-27 LAB — BASIC METABOLIC PANEL
Anion gap: 11 (ref 5–15)
BUN: 18 mg/dL (ref 8–23)
CO2: 30 mmol/L (ref 22–32)
Calcium: 9 mg/dL (ref 8.9–10.3)
Chloride: 98 mmol/L (ref 98–111)
Creatinine, Ser: 1.38 mg/dL — ABNORMAL HIGH (ref 0.61–1.24)
GFR, Estimated: 57 mL/min — ABNORMAL LOW (ref >60)
Glucose, Bld: 115 mg/dL — ABNORMAL HIGH (ref 70–99)
Potassium: 4.1 mmol/L (ref 3.5–5.1)
Sodium: 139 mmol/L (ref 135–145)

## 2022-02-27 LAB — MAGNESIUM: Magnesium: 2.3 mg/dL (ref 1.7–2.4)

## 2022-02-27 MED ORDER — FUROSEMIDE 40 MG PO TABS
40.0000 mg | ORAL_TABLET | Freq: Every day | ORAL | Status: DC
  Administered 2022-02-27 – 2022-02-28 (×2): 40 mg via ORAL
  Filled 2022-02-27 (×2): qty 1

## 2022-02-27 NOTE — Progress Notes (Signed)
Progress Note   Patient: Allen Mayer TFT:[id number] DOB: 1960-10-08 DOA: 02/24/2022     2 DOS: the patient was seen and examined on 02/27/2022   Brief hospital course: Allen Mayer was admitted to the hospital with the working diagnosis of decompensated heart failure.   61 yo male with the past medical history of heart failure with reduced systolic function, LV thrombus, CKD, COPD, and hypertension who presented with dyspnea and lower extremity edema for 3 days, associated with dry cough. Apparently patient has not been compliant with his medications at home On his initial physical examination his blood pressure was 116/92, HR 72, RR 16, 02 saturation 95%, lungs with rales bilaterally, with increased work of breathing, heart with S1 and S2 present and rhythmic, abdomen with no distention, positive upper and lower extremity edema.   Na 136, K 4.1 Cl 102, bicarbonate 23, glucose 113, bun 19 cr 1,36  BNP 3,388  Wbc 3,6 hgb 16.0 plt 177  Toxicology screen positive for cocaine   Chest radiograph with cardiomegaly with bilateral interstitial infiltrates, peribronchial cuffing with no effusions.  EKG 86 bpm, left axis deviation, qtc not prolonged, sinus with 1st degree AV block type 2, q wave lead II, III, AvF, V1 to V3, with no significant ST segment or T wave changes.   11/18 patient is responding well to diuresis with IV furosemide.  11/19 clinically improving, transitioned to oral furosemide and plan for possible discharge home tomorrow if patient continue to improve.   Assessment and Plan: * Acute on chronic systolic CHF (congestive heart failure) (HCC) Echocardiogram with reduced LV systolic function to < 20% with global hypokinesis, RV with sever reduction in systolic function, LA and RA with moderate dilatation, with no significant valvular disease.   Urine output is 5,100 cc Systolic blood pressure 112 mmHg.   Continue medical therapy with Losartan and digoxin  Diuresis with  spironolactone, empagliflozin and transitioned to oral furoswemide Add TED hose to lower extremities.   Acute kidney injury superimposed on chronic kidney disease (HCC) CKD stage 3a Hypomagnesemia   Renal function with serum cr at 1.38 with K at 4,1 and serum bicarbonate at 30. Mg is 2,3  Continue diuresis and follow up renal function in am.    LV (left ventricular) mural thrombus Continue anticoagulation with rivaroxaban   COPD (chronic obstructive pulmonary disease) (HCC) No signs of exacerbation, continue oxymetry monitoring  Continue bronchodilator therapy.  Cocaine abuse (HCC) Advised to avoid cocaine   Tobacco abuse Smoking cessation         Subjective: Patient with improvement in dyspnea and edema but not yet back to baseline, no chest pain   Physical Exam: Vitals:   02/27/22 0427 02/27/22 0742 02/27/22 0832 02/27/22 0930  BP: 112/77 112/84    Pulse: 79 83 81 82  Resp: 20 18 16    Temp: (!) 97.5 F (36.4 C) (!) 97.3 F (36.3 C)    TempSrc: Oral Oral    SpO2: 95% 94%    Weight: 74.4 kg     Height:       Neurology awake and alert ENT with mild pallor Cardiovascular with S1 and S2 present and rhythmic with no gallops, rubs or murmurs. No JVD Trace lower extremity edema  Respiratory with mild rales with no wheezing or rhonchi Abdomen with no distention  Data Reviewed:    Family Communication: no family at the bedside   Disposition: Status is: Inpatient Remains inpatient appropriate because: heart failure   Planned Discharge Destination: Home  Author: Tawni Millers, MD 02/27/2022 1:39 PM  For on call review [email or web address].

## 2022-02-27 NOTE — Plan of Care (Signed)
  Problem: Clinical Measurements: Goal: Will remain free from infection Outcome: Completed/Met   Problem: Nutrition: Goal: Adequate nutrition will be maintained Outcome: Completed/Met   Problem: Elimination: Goal: Will not experience complications related to bowel motility Outcome: Completed/Met Goal: Will not experience complications related to urinary retention Outcome: Completed/Met   Problem: Pain Managment: Goal: General experience of comfort will improve Outcome: Completed/Met   Problem: Skin Integrity: Goal: Risk for impaired skin integrity will decrease Outcome: Completed/Met

## 2022-02-28 ENCOUNTER — Other Ambulatory Visit (HOSPITAL_COMMUNITY): Payer: Self-pay

## 2022-02-28 ENCOUNTER — Telehealth (HOSPITAL_COMMUNITY): Payer: Self-pay | Admitting: Pharmacy Technician

## 2022-02-28 LAB — BASIC METABOLIC PANEL
Anion gap: 12 (ref 5–15)
BUN: 23 mg/dL (ref 8–23)
CO2: 27 mmol/L (ref 22–32)
Calcium: 9.4 mg/dL (ref 8.9–10.3)
Chloride: 100 mmol/L (ref 98–111)
Creatinine, Ser: 1.28 mg/dL — ABNORMAL HIGH (ref 0.61–1.24)
GFR, Estimated: >60 mL/min (ref >60)
Glucose, Bld: 98 mg/dL (ref 70–99)
Potassium: 4.4 mmol/L (ref 3.5–5.1)
Sodium: 139 mmol/L (ref 135–145)

## 2022-02-28 MED ORDER — SPIRONOLACTONE 25 MG PO TABS
12.5000 mg | ORAL_TABLET | Freq: Every day | ORAL | 0 refills | Status: DC
  Filled 2022-02-28: qty 15, 30d supply, fill #0

## 2022-02-28 MED ORDER — MOMETASONE FURO-FORMOTEROL FUM 200-5 MCG/ACT IN AERO
2.0000 | INHALATION_SPRAY | Freq: Two times a day (BID) | RESPIRATORY_TRACT | 2 refills | Status: DC
  Filled 2022-02-28: qty 13, 30d supply, fill #0

## 2022-02-28 MED ORDER — DIGOXIN 125 MCG PO TABS
0.1250 mg | ORAL_TABLET | Freq: Every day | ORAL | 0 refills | Status: DC
  Filled 2022-02-28: qty 30, 30d supply, fill #0

## 2022-02-28 MED ORDER — EMPAGLIFLOZIN 10 MG PO TABS
10.0000 mg | ORAL_TABLET | Freq: Every day | ORAL | 0 refills | Status: DC
  Filled 2022-02-28: qty 30, 30d supply, fill #0

## 2022-02-28 MED ORDER — TORSEMIDE 20 MG PO TABS
20.0000 mg | ORAL_TABLET | Freq: Every day | ORAL | 0 refills | Status: DC
  Filled 2022-02-28: qty 30, 30d supply, fill #0

## 2022-02-28 MED ORDER — LOSARTAN POTASSIUM 25 MG PO TABS
25.0000 mg | ORAL_TABLET | Freq: Every day | ORAL | 0 refills | Status: DC
  Filled 2022-02-28: qty 30, 30d supply, fill #0

## 2022-02-28 NOTE — Progress Notes (Signed)
   Heart Failure Stewardship Pharmacist Progress Note   PCP: Patient, No Pcp Per PCP-Cardiologist: None    HPI:   61 y.o. male with PMH significant for HTN, COPD, HFrEF, polysubstance use disorder, schizophrenia, and LV thrombus. He presented to the ED on 11/16 with complaints of bilateral LEE, SOB, fatigue, and inability to ambulate d/t leg pain. He reports missing 2 doses of his Lasix. He denies current cocaine use.   EF dating back to 2019 with an EF of 25-30%. He has been in and out of the ED multiple times this year. His most recent hospitalization for HF exacerbation was 10/6-10/12/2021. Most recent ECHO on 11/02/2021 with EF <20%, global hypokinesis, moderate dilation of the LV, and mild RV dilation. Right heart cath on 08/02/2021 demonstrated non-obstructive CAD, normal l/r heart filling pressures, and moderately reduced cardiac output/index. CXR on 11/16 showed cardiomegaly with pulmonary venous congestion.  Patient being discharged today, expresses concern for no housing after discharge.  Discharge HF Medications: Diuretic: torsemide 20 mg daily ACE/ARB/ARNI: losartan 25mg  daily MRA: spironolactone 12.5 mg daily SGLT2i: Jardiance 10 mg daily Other: digoxin 0.125mg  daily  Prior to admission HF Medications: Diuretic: furosemide 40mg  once daily ACE/ARB/ARNI: losartan 25mg  once daily Other: digoxin 0.125mg  once daily  Pertinent Lab Values: Serum creatinine 1.28, BUN 23, Potassium 4.4, Sodium 139, BNP 3,388.6, Magnesium 2.3, A1c 6.2, Digoxin <0.2   Vital Signs: Weight: 168 lbs (admission weight: 185 lbs) Blood pressure: 110/90s Heart rate: 70-80s  I/O: -2L yesterday; net -20.1L  Medication Assistance / Insurance Benefits Check: Does the patient have prescription insurance?  Yes Type of insurance plan: [place] Medicaid  Outpatient Pharmacy:  Prior to admission outpatient pharmacy: Pharmacy Is the patient willing to use [facility] TOC pharmacy at discharge? Pending Is the patient  willing to transition their outpatient pharmacy to utilize a [facility] outpatient pharmacy?   Pending    Assessment: 1. Acute on chronic systolic CHF (LVEF <20%), due to polysubstance abuse. NYHA class II symptoms. - Agree with discharging on torsemide - Previously on carvedilol. Consider reinitiating at follow up  - Continue losartan 25 mg daily and spironolactone 12.5 mg daily - Continue Jardiance 10 mg daily, prior authorization approved - Continue digoxin 0.125 mg daily, recheck level at follow up, concerned about compliance   Plan: 1) Medication changes recommended at this time: -Discharge today, stressed the need to come to [facility] appt on 11/28  2) Patient assistance: -Has [place] Medicaid insurance -PA required for SGLT2i and [facility], Jardiance approved through 02/28/23 -Consulted RNCM to speak with patient regarding housing -Advised pt to call Medicaid transportation to arrange appts -Highly concerned about compliance on discharge, pt not interactive during education   3)  Education  - Patient has been educated on current HF medications and potential additions to HF medication regimen - Patient verbalizes understanding that over the next few months, these medication doses may change and more medications may be added to optimize HF regimen - Patient has been educated on basic disease state pathophysiology and goals of therapy  12/28, PharmD, BCPS Heart Failure Sherryll Burger Phone [phone]

## 2022-02-28 NOTE — TOC CM/SW Note (Signed)
HF TOC CM spoke to pt and fiance, Gloria at bedside. Pt was able to stay at [facility] but currently per fiance, he is not eligible. Contacted [employer] and left message for return call to follow up on eligibility. Offered shelter in [place] and pt declined. Referral sent to [facility], will secure a small unit for a patient to stay until March. Spaces are limited.Left message for coordinator, Ms Curtain. Isidoro Donning RN3 CCM, Heart Failure TOC CM [phone]

## 2022-02-28 NOTE — Plan of Care (Signed)
  Problem: Education: Goal: Knowledge of General Education information will improve Description: Including pain rating scale, medication(s)/side effects and non-pharmacologic comfort measures Outcome: Adequate for Discharge   Problem: Health Behavior/Discharge Planning: Goal: Ability to manage health-related needs will improve Outcome: Adequate for Discharge   Problem: Clinical Measurements: Goal: Ability to maintain clinical measurements within normal limits will improve Outcome: Adequate for Discharge Goal: Diagnostic test results will improve Outcome: Adequate for Discharge Goal: Respiratory complications will improve Outcome: Adequate for Discharge Goal: Cardiovascular complication will be avoided Outcome: Adequate for Discharge   Problem: Activity: Goal: Risk for activity intolerance will decrease Outcome: Adequate for Discharge   Problem: Coping: Goal: Level of anxiety will decrease Outcome: Adequate for Discharge   Problem: Safety: Goal: Ability to remain free from injury will improve Outcome: Adequate for Discharge   Problem: Education: Goal: Ability to demonstrate management of disease process will improve Outcome: Adequate for Discharge Goal: Ability to verbalize understanding of medication therapies will improve Outcome: Adequate for Discharge Goal: Individualized Educational Video(s) Outcome: Adequate for Discharge   Problem: Activity: Goal: Capacity to carry out activities will improve Outcome: Adequate for Discharge   Problem: Cardiac: Goal: Ability to achieve and maintain adequate cardiopulmonary perfusion will improve Outcome: Adequate for Discharge

## 2022-02-28 NOTE — TOC Transition Note (Signed)
Transition of Care [facility]) - CM/SW Discharge Note   Patient Details  Name: Allen Mayer MRN: [id number] Date of Birth: 03/03/1961  [facility] CM/SW Contact:  Leone Haven, RN Phone Number: 02/28/2022, 10:26 AM   Clinical Narrative:    NCM spoke with patient at bedside, he states he needs a rolllator,  NCM offered choice for DME agency, he and girlfriend has no preference. He was given a Barrister's clerk from [employer], he will get bus passes at dc, he is ambulatory.    Final next level of care: Homeless Shelter Barriers to Discharge: No Barriers Identified   Patient Goals and CMS Choice Patient states their goals for this hospitalization and ongoing recovery are:: go to [employer].gov Compare Post Acute Care list provided to:: Patient Represenative (must comment) Choice offered to / list presented to :  (girlfriend)  Discharge Placement                       Discharge Plan and Services                DME Arranged: Walker rolling with seat DME Agency: [employer] Date DME Agency Contacted: 02/28/22 Time DME Agency Contacted: 1026 Representative spoke with at DME Agency: [employer] rep HH Arranged: NA          Social Determinants of Health (SDOH) Interventions Food Insecurity Interventions: Intervention Not Indicated Housing Interventions: Intervention Not Indicated Transportation Interventions: Intervention Not Indicated Alcohol Usage Interventions: Intervention Not Indicated (Score <7) Financial Strain Interventions: Intervention Not Indicated   Readmission Risk Interventions    11/04/2021    2:35 PM 12/18/2020   12:36 PM  Readmission Risk Prevention Plan  Post Dischage Appt    Medication Screening    Transportation Screening Complete   Medication Review (RN Care Manager) Complete   PCP or Specialist appointment within 3-5 days of discharge Complete   HRI or Home Care Consult Complete   SW Recovery Care/Counseling Consult  Complete   Palliative Care Screening Not Applicable   Skilled Nursing Facility Not Applicable      Information is confidential and restricted. Go to Review Flowsheets to unlock data.

## 2022-02-28 NOTE — Discharge Summary (Addendum)
Physician Discharge Summary   Patient: Allen Mayer MRN: [id number] DOB: April 25, 1960  Admit date:     02/24/2022  Discharge date: 02/28/22  Discharge Physician: York Ram Amos Gaber   PCP: Patient, No Pcp Per   Recommendations at discharge:    Patient has been placed on spironolactone and SGLT 2 inh Continue torsemide for diuresis.  Follow up as outpatient with primary care in 7 to 10 days Follow up with Cardiology as scheduled Follow up renal function and electrolytes in 7 days.   Discharge Diagnoses: Principal Problem:   Acute on chronic systolic CHF (congestive heart failure) (HCC) Active Problems:   Acute kidney injury superimposed on chronic kidney disease (HCC)   LV (left ventricular) mural thrombus   COPD (chronic obstructive pulmonary disease) (HCC)   Cocaine abuse (HCC)   Tobacco abuse  Resolved Problems:   * No resolved hospital problems. [facility] Course: Mr. Lapiana was admitted to the hospital with the working diagnosis of decompensated heart failure.   61 yo male with the past medical history of heart failure with reduced systolic function, LV thrombus, CKD, COPD, and hypertension who presented with dyspnea and lower extremity edema for 3 days, associated with dry cough. Apparently patient has not been compliant with his medications at home On his initial physical examination his blood pressure was 116/92, HR 72, RR 16, 02 saturation 95%, lungs with rales bilaterally, with increased work of breathing, heart with S1 and S2 present and rhythmic, abdomen with no distention, positive upper and lower extremity edema.   Na 136, K 4.1 Cl 102, bicarbonate 23, glucose 113, bun 19 cr 1,36  BNP 3,388  Wbc 3,6 hgb 16.0 plt 177  Toxicology screen positive for cocaine   Chest radiograph with cardiomegaly with bilateral interstitial infiltrates, peribronchial cuffing with no effusions.  EKG 86 bpm, left axis deviation, qtc not prolonged, sinus with 1st degree AV  block type 2, q wave lead II, III, AvF, V1 to V3, with no significant ST segment or T wave changes.   11/18 patient is responding well to diuresis with IV furosemide.  11/19 clinically improving, transitioned to oral furosemide and plan for possible discharge home tomorrow if patient continue to improve.   Assessment and Plan: * Acute on chronic systolic CHF (congestive heart failure) (HCC) Echocardiogram with reduced LV systolic function to < 20% with global hypokinesis, RV with sever reduction in systolic function, LA and RA with moderate dilatation, with no significant valvular disease.   Patient was placed on IV furosemide for diuresis, negative fluid balance was achieved, -20,119 ml with significant improvement in his symptoms.   Patient will continue guideline medical therapy with  Losartan and digoxin, spironolactone, empagliflozin and torsemide.   Will need close follow up as outpatient, he has been advised to avoid cocaine and be compliant with his medications.   Acute kidney injury superimposed on chronic kidney disease (HCC) CKD stage 3a Hypomagnesemia   Patient responded well to diuresis, his Mag was corrected with Mag sulfate IV. At the time of his discharge his serum cr is 1,28 with K at 4,4 and serum bicarbonate at 27. Mg 2,3   Plan to continue diuresis with spironolactone, empagliflozin and torsemide  Follow up renal function as outpatient    LV (left ventricular) mural thrombus Continue anticoagulation with rivaroxaban   COPD (chronic obstructive pulmonary disease) (HCC) No signs of exacerbation, continue oxymetry monitoring  Continue bronchodilator therapy.  Cocaine abuse (HCC) Advised to avoid cocaine   Tobacco abuse Smoking  cessation          Consultants: none  Procedures performed: none  Disposition: Home Diet recommendation:  Cardiac diet DISCHARGE MEDICATION: Allergies as of 02/28/2022       Reactions   Flexeril [cyclobenzaprine] Other  (See Comments)   Caused cramping and patient did not like the way it made it feel. Requests this medication to NOT be given to him.        Medication List     STOP taking these medications    Flovent HFA 110 MCG/ACT inhaler Generic drug: fluticasone   furosemide 40 MG tablet Commonly known as: Lasix       TAKE these medications    ARIPiprazole 10 MG tablet Commonly known as: ABILIFY Take 1 tablet (10 mg total) by mouth daily.   Aspirin Low Dose 81 MG tablet Generic drug: aspirin EC Take 1 tablet (81 mg total) by mouth daily. Swallow whole.   atorvastatin 80 MG tablet Commonly known as: LIPITOR Take 1 tablet (80 mg total) by mouth daily.   benzonatate 100 MG capsule Commonly known as: TESSALON Take 1 capsule (100 mg total) by mouth 3 (three) times daily.   benztropine 1 MG tablet Commonly known as: COGENTIN Take 1 tablet (1 mg total) by mouth 2 (two) times daily.   citalopram 20 MG tablet Commonly known as: CELEXA Take 1 tablet (20 mg total) by mouth daily.   digoxin 0.125 MG tablet Commonly known as: LANOXIN Take 1 tablet (0.125 mg total) by mouth daily.   empagliflozin 10 MG Tabs tablet Commonly known as: JARDIANCE Take 1 tablet (10 mg total) by mouth daily.   gabapentin 400 MG capsule Commonly known as: NEURONTIN Take 1 capsule (400 mg total) by mouth daily as needed (nerve pain).   Incruse Ellipta 62.5 MCG/ACT Aepb Generic drug: umeclidinium bromide Inhale 1 puff into the lungs daily.   losartan 25 MG tablet Commonly known as: COZAAR Take 1 tablet (25 mg total) by mouth daily.   mometasone-formoterol 200-5 MCG/ACT Aero Commonly known as: DULERA Inhale 2 puffs into the lungs 2 (two) times daily.   Mucus Relief DM 30-600 MG Tb12 Take 1 tablet by mouth 2 (two) times daily. What changed:  when to take this reasons to take this   nitroGLYCERIN 0.4 MG SL tablet Commonly known as: NITROSTAT Place 1 tablet (0.4 mg total) under the tongue every  5 (five) minutes as needed for chest pain.   pantoprazole 40 MG tablet Commonly known as: PROTONIX Take 1 tablet (40 mg total) by mouth daily.   spironolactone 25 MG tablet Commonly known as: ALDACTONE Take 1/2 tablets (12.5 mg total) by mouth daily.   torsemide 20 MG tablet Commonly known as: DEMADEX Take 1 tablet (20 mg total) by mouth daily.   Ventolin HFA 108 (90 Base) MCG/ACT inhaler Generic drug: albuterol Inhale 1-2 puffs into the lungs every 6 (six) hours as needed for wheezing or shortness of breath.   Xarelto 20 MG Tabs tablet Generic drug: rivaroxaban Take 1 tablet (20 mg total) by mouth daily with supper.               Durable Medical Equipment  (From admission, onward)           Start     Ordered   02/28/22 1014  For home use only DME 4 wheeled rolling walker with seat  Once       Question:  Patient needs a walker to treat with the following condition  Answer:  Weakness   02/28/22 1014            Follow-up Information     [facility]. Go in 11 day(s).   Specialty: Cardiology Why: Hospital follow up PLEASE bring a current medication list to appointment FREE valet parking, Entrance C, off [employer] information: [address] [phone] mc [place] [address] [phone]        [facility] Follow up on 03/02/2022.   Specialty: Family Medicine Why: @10 :50am Contact information: [place] [phone]               Discharge Exam: Filed Weights   02/26/22 0422 02/27/22 0427 02/28/22 0500  Weight: 75.8 kg 74.4 kg 76.6 kg   BP 104/68 (BP Location: Left Arm)   Pulse 77   Temp 98.1 F (36.7 C) (Oral)   Resp 19   Ht 6' (1.829 m)   Wt 76.6 kg   SpO2 98%   BMI 22.90 kg/m   Patient with improvement in dyspnea and edema, no chest pain.   Neurology awake and alert ENT with no  pallor Cardiovascular with S1 and S2 present and rhythmic with no gallops, rubs or murmurs Respiratory with no rales or wheezing Abdomen with no distention  No lower extremity edema   Condition at discharge: stable  The results of significant diagnostics from this hospitalization (including imaging, microbiology, ancillary and laboratory) are listed below for reference.   Imaging Studies: DG Chest 1 View  Result Date: 02/25/2022 CLINICAL DATA:  CHF EXAM: CHEST  1 VIEW COMPARISON:  Yesterday FINDINGS: Unchanged cardiopericardial enlargement with generalized interstitial coarsening and cephalized flow. No visible effusion or pneumothorax. No focal pneumonia. IMPRESSION: Unchanged cardiomegaly and vascular congestion. Electronically Signed   By: 02/27/2022 M.D.   On: 02/25/2022 07:23   DG Chest 2 View  Result Date: 02/24/2022 CLINICAL DATA:  Shortness of breath EXAM: CHEST - 2 VIEW COMPARISON:  CXR 01/18/22 FINDINGS: No pleural effusion. No pneumothorax. Cardiomegaly. Unchanged mediastinal contours. No displaced rib fractures. No focal airspace opacity. Mildly prominent interstitial opacities suggestive of pulmonary venous congestion. No overt pulmonary edema. Visualized upper abdomen unremarkable. Vertebral body heights are maintained. IMPRESSION: Cardiomegaly with pulmonary venous congestion. No overt pulmonary edema. Electronically Signed   By: 03/20/22 M.D.   On: 02/24/2022 12:55    Microbiology: Results for orders placed or performed during the hospital encounter of 01/01/22  SARS Coronavirus 2 by RT PCR (hospital order, performed in [facility] hospital lab) *cepheid single result test* Anterior Nasal Swab     Status: None   Collection Time: 01/02/22  1:06 AM   Specimen: Anterior Nasal Swab  Result Value Ref Range Status   SARS Coronavirus 2 by RT PCR NEGATIVE NEGATIVE Final    Comment: (NOTE) SARS-CoV-2 target nucleic acids are NOT DETECTED.  The SARS-CoV-2 RNA is  generally detectable in upper and lower respiratory specimens during the acute phase of infection. The lowest concentration of SARS-CoV-2 viral copies this assay can detect is 250 copies / mL. A negative result does not preclude SARS-CoV-2 infection and should not be used as the sole basis for treatment or other patient management decisions.  A negative result may occur with improper specimen collection / handling, submission of specimen other than nasopharyngeal swab, presence of viral mutation(s) within the areas targeted by this assay, and inadequate number of viral copies (<250 copies /  mL). A negative result must be combined with clinical observations, patient history, and epidemiological information.  Fact Sheet for Patients:   RoadLapTop.co.za  Fact Sheet for Healthcare Providers: http://kim-miller.com/  This test is not yet approved or  cleared by the [place] FDA and has been authorized for detection and/or diagnosis of SARS-CoV-2 by FDA under an Emergency Use Authorization (EUA).  This EUA will remain in effect (meaning this test can be used) for the duration of the COVID-19 declaration under Section 564(b)(1) of the Act, 21 U.S.C. section 360bbb-3(b)(1), unless the authorization is terminated or revoked sooner.  Performed at [facility], [address] [place], [place] [address]     Labs: CBC: Recent Labs  Lab 02/24/22 0702  WBC 3.6*  NEUTROABS 1.8  HGB 16.0  HCT 49.6  MCV 88.7  PLT 177   Basic Metabolic Panel: Recent Labs  Lab 02/24/22 0702 02/25/22 0049 02/26/22 0035 02/27/22 0049 02/28/22 0010  NA 133* 141 141 139 139  K 4.1 3.7 3.9 4.1 4.4  CL 102 104 99 98 100  CO2 23 26 30 30 27   GLUCOSE 113* 115* 102* 115* 98  BUN 19 16 18 18 23   CREATININE 1.36* 1.33* 1.62* 1.38* 1.28*  CALCIUM 9.0 8.9 8.8* 9.0 9.4  MG  --   --  1.5* 2.3  --    Liver Function Tests: No results for input(s): "AST",  "ALT", "ALKPHOS", "BILITOT", "PROT", "ALBUMIN" in the last 168 hours. CBG: No results for input(s): "GLUCAP" in the last 168 hours.  Discharge time spent: greater than 30 minutes.  Signed: , MD [facility] 02/28/2022

## 2022-02-28 NOTE — TOC Benefit Eligibility Note (Signed)
Patient Product/process development scientist completed.    The patient is currently admitted and upon discharge could be taking Jardiance 10 mg.  Prior Authorization Required  The patient is insured through [facility] Medicaid     Roland Earl, CPhT Pharmacy Patient Advocate Specialist [facility] Patient Advocate Team Direct Number: [phone]  Fax: [phone]

## 2022-02-28 NOTE — Progress Notes (Signed)
Mobility Specialist Progress Note:   02/28/22 1110  Mobility  Activity Ambulated with assistance in hallway  Level of Assistance Contact guard assist, steadying assist  Assistive Device Four wheel walker  Distance Ambulated (ft) 150 ft  Activity Response Tolerated well  $Mobility charge 1 Mobility   Pt received in bed willing to participate in mobility. Complaints of 8/10 knee pain. MinA to stand then contact guard throughout.  Left in bed with call bell in reach and all needs met.  Gareth Eagle Gittel [place] Mobility Specialist Please contact via [employer] or  Rehab Office at [phone]

## 2022-02-28 NOTE — Telephone Encounter (Signed)
Patient Advocate Encounter  Prior Authorization for Jardiance 10 mg has been approved.    PA# [id number] Confirmation#[id number] W Effective dates: 02/28/2022 through 02/28/2023      Roland Earl, CPhT Pharmacy Patient Advocate Specialist [facility] Patient Advocate Team Direct Number: [phone]  Fax: [phone]

## 2022-03-01 ENCOUNTER — Telehealth: Payer: Self-pay | Admitting: *Deleted

## 2022-03-01 NOTE — Telephone Encounter (Signed)
Received call from [facility], admissions coordinator, Lawanna Kobus and they will accept pt back. She placed his name on waiting list. Isidoro Donning RN3 CCM, Heart Failure TOC CM [phone]

## 2022-03-02 ENCOUNTER — Inpatient Hospital Stay (INDEPENDENT_AMBULATORY_CARE_PROVIDER_SITE_OTHER): Payer: Medicaid Other | Admitting: Primary Care
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
## 2022-03-08 ENCOUNTER — Encounter (HOSPITAL_COMMUNITY): Payer: Medicaid Other

## 2022-03-08 NOTE — Telephone Encounter (Signed)
Called to confirm Heart & Vascular Transitions of Care appointment at 11 am on 03/08/22. Patient reminded to bring all medications and pill box organizer with them. Confirmed patient has transportation. Gave directions, instructed to utilize valet parking.  Confirmed appointment prior to ending call with patients girlfriend Shirlene.  Rhae Hammock, BSN, Scientist, clinical (histocompatibility and immunogenetics) Only

## 2022-03-11 ENCOUNTER — Emergency Department (HOSPITAL_COMMUNITY): Payer: Medicaid Other

## 2022-03-11 ENCOUNTER — Emergency Department (HOSPITAL_COMMUNITY)
Admission: EM | Admit: 2022-03-11 | Discharge: 2022-03-12 | Disposition: A | Discharge status: Home / Self Care | Payer: Medicaid Other | Attending: Emergency Medicine | Admitting: Emergency Medicine

## 2022-03-11 DIAGNOSIS — F17210 Nicotine dependence, cigarettes, uncomplicated: Secondary | ICD-10-CM | POA: Insufficient documentation

## 2022-03-11 DIAGNOSIS — J449 Chronic obstructive pulmonary disease, unspecified: Secondary | ICD-10-CM | POA: Diagnosis not present

## 2022-03-11 DIAGNOSIS — F10920 Alcohol use, unspecified with intoxication, uncomplicated: Secondary | ICD-10-CM | POA: Diagnosis present

## 2022-03-11 DIAGNOSIS — N183 Chronic kidney disease, stage 3 unspecified: Secondary | ICD-10-CM | POA: Insufficient documentation

## 2022-03-11 DIAGNOSIS — I130 Hypertensive heart and chronic kidney disease with heart failure and stage 1 through stage 4 chronic kidney disease, or unspecified chronic kidney disease: Secondary | ICD-10-CM | POA: Diagnosis not present

## 2022-03-11 DIAGNOSIS — I509 Heart failure, unspecified: Secondary | ICD-10-CM | POA: Insufficient documentation

## 2022-03-11 DIAGNOSIS — I2510 Atherosclerotic heart disease of native coronary artery without angina pectoris: Secondary | ICD-10-CM | POA: Insufficient documentation

## 2022-03-11 NOTE — ED Notes (Signed)
Unable to complete triage screenings and obtain MSE signature due to pt intoxication at time of arrival.

## 2022-03-11 NOTE — ED Triage Notes (Signed)
Pt via [facility] c/o ETOH intoxication. Per EMS, pt was trying to get into his old house to sleep but was turned away and fell off the porch, 12 inches or so to ground. Hx bilateral chronic knee pain with complaints of same. No other injuries. Pt is asleep on arrival in NAD.   BP 112/92 HR 88 O2 96% ra RR WNL CBG 175

## 2022-03-11 NOTE — ED Provider Notes (Signed)
[facility]-EMERGENCY DEPT [facility] Emergency Department Provider Note MRN:  [id number]  Arrival date & time: 03/12/22     Chief Complaint   Alcohol Intoxication   History of Present Illness   Allen Mayer is a 61 y.o. year-old male with a history of alcohol use disorder, CAD, COPD presenting to the ED with chief complaint of intoxication.  Brought by EMS, suspected alcohol intoxication, was confused trying to get into a place where he used to live.  Possibly fell, complaining of bilateral knee pain.  Review of Systems  A thorough review of systems was obtained and all systems are negative except as noted in the HPI and PMH.   Patient's Health History    Past Medical History:  Diagnosis Date   Acid reflux    Alcohol abuse    Arthritis    Asthma    Back pain    Bronchitis    Chest pain 07/31/2021   CHF (congestive heart failure) (HCC)    CKD (chronic kidney disease)    CKD (chronic kidney disease), stage III (HCC)    Cocaine abuse (HCC)    COPD (chronic obstructive pulmonary disease) (HCC)    History of noncompliance with medical treatment, presenting hazards to health    Homelessness    Hypertension    LV (left ventricular) mural thrombus    Myocardial infarction (HCC)    Neuropathic pain    NICM (nonischemic cardiomyopathy) (HCC) 2019   NSTEMI (non-ST elevated myocardial infarction) (HCC) 07/31/2021   NSVT (nonsustained ventricular tachycardia) (HCC)    Pericardial effusion    Pulmonary hypertension (HCC)    RVF (right ventricular failure) (HCC)    Schizo affective schizophrenia (HCC)     Past Surgical History:  Procedure Laterality Date   I & D EXTREMITY Bilateral 12/14/2020   Procedure: IRRIGATION AND DEBRIDEMENT AND CLOSURE OF LACERTATIONS OF BILATEAL ARMS;  Surgeon: Fritzi Mandes, MD;  Location: [facility] OR;  Service: General;  Laterality: Bilateral;   LAPAROTOMY N/A 12/14/2020   Procedure: EXPLORATORY LAPAROTOMY;  Surgeon: Fritzi Mandes, MD;  Location: [facility] OR;   Service: General;  Laterality: N/A;   None     RIGHT HEART CATH AND CORONARY ANGIOGRAPHY N/A 08/02/2021   Procedure: RIGHT HEART CATH AND CORONARY ANGIOGRAPHY;  Surgeon: Yvonne Kendall, MD;  Location: [facility] INVASIVE CV LAB;  Service: Cardiovascular;  Laterality: N/A;   RIGHT/LEFT HEART CATH AND CORONARY ANGIOGRAPHY N/A 12/13/2017   Procedure: RIGHT/LEFT HEART CATH AND CORONARY ANGIOGRAPHY;  Surgeon: Runell Gess, MD;  Location: [facility] INVASIVE CV LAB;  Service: Cardiovascular;  Laterality: N/A;    Family History  Problem Relation Age of Onset   Heart attack Mother        Died age 36   Heart attack Brother        45    Social History   Socioeconomic History   Marital status: Significant Other    Spouse name: Not on file   Number of children: 4   Years of education: Not on file   Highest education level: High school graduate  Occupational History   Occupation: disability  Tobacco Use   Smoking status: Every Day    Packs/day: 0.50    Types: Cigarettes   Smokeless tobacco: Never  Vaping Use   Vaping Use: Never used  Substance and Sexual Activity   Alcohol use: Yes    Comment: occasionally   Drug use: Yes    Types: Marijuana, Cocaine    Comment: couple days ago  Sexual activity: Not on file  Other Topics Concern   Not on file  Social History Narrative   ** Merged History Encounter **       Lives with fiance.     Social Determinants of Health   Financial Resource Strain: Low Risk  (02/25/2022)   Overall Financial Resource Strain (CARDIA)    Difficulty of Paying Living Expenses: Not very hard  Recent Concern: Financial Resource Strain - Medium Risk (01/14/2022)   Overall Financial Resource Strain (CARDIA)    Difficulty of Paying Living Expenses: Somewhat hard  Food Insecurity: No Food Insecurity (02/25/2022)   Hunger Vital Sign    Worried About Running Out of Food in the Last Year: Never true    Ran Out of Food in the Last Year: Never true  Recent Concern: Food  Insecurity - Food Insecurity Present (01/16/2022)   Hunger Vital Sign    Worried About Running Out of Food in the Last Year: Sometimes true    Ran Out of Food in the Last Year: Sometimes true  Transportation Needs: No Transportation Needs (02/25/2022)   PRAPARE - Administrator, Civil Service (Medical): No    Lack of Transportation (Non-Medical): No  Physical Activity: Not on file  Stress: Not on file  Social Connections: Not on file  Intimate Partner Violence: Not At Risk (02/25/2022)   Humiliation, Afraid, Rape, and Kick questionnaire    Fear of Current or Ex-Partner: No    Emotionally Abused: No    Physically Abused: No    Sexually Abused: No     Physical Exam   Vitals:   03/12/22 0413 03/12/22 0544  BP: 99/72 123/87  Pulse: 73 88  Resp: 16 (!) 22  Temp:  97.9 F (36.6 C)  SpO2: 97% 95%    CONSTITUTIONAL: Chronically ill-appearing, NAD NEURO/PSYCH: Somnolent, difficult to wake EYES:  eyes equal and reactive ENT/NECK:  no LAD, no JVD CARDIO: Regular rate, well-perfused, normal S1 and S2 PULM:  CTAB no wheezing or rhonchi GI/GU:  non-distended, non-tender MSK/SPINE:  No gross deformities, no edema SKIN:  no rash, atraumatic   *Additional and/or pertinent findings included in MDM below  Diagnostic and Interventional Summary    EKG Interpretation  Date/Time:    Ventricular Rate:    PR Interval:    QRS Duration:   QT Interval:    QTC Calculation:   R Axis:     Text Interpretation:         Labs Reviewed - No data to display  CT HEAD WO CONTRAST ( )  Final Result      Medications - No data to display   Procedures  /  Critical Care Procedures  ED Course and Medical Decision Making  Initial Impression and Ddx Suspect alcohol intoxication however there is report of trauma, he is a 61, generally unhealthy, will obtain CT head to exclude subdural hematoma, allow time for metabolization.  Past medical/surgical history that increases  complexity of ED encounter: Alcohol use disorder, substance use disorder  Interpretation of Diagnostics CT head is without acute process.  Patient Reassessment and Ultimate Disposition/Management     On reassessment at 6 AM patient is clinically sober and appropriate for discharge.  Patient management required discussion with the following services or consulting groups:  None  Complexity of Problems Addressed Acute illness or injury that poses threat of life of bodily function  Additional Data Reviewed and Analyzed Further history obtained from: Past medical history and medications listed in the  EMR and Prior ED visit notes  Additional Factors Impacting ED Encounter Risk None  Elmer Sow. Pilar Plate, MD [facility] Emergency Medicine [facility] [email or web address]  Final Clinical Impressions(s) / ED Diagnoses     ICD-10-CM   1. Alcoholic intoxication without complication (HCC)  F74.944       ED Discharge Orders     None        Discharge Instructions Discussed with and Provided to Patient:     Discharge Instructions      You were evaluated in the Emergency Department and after careful evaluation, we did not find any emergent condition requiring admission or further testing in the hospital.  Your exam/testing today was overall reassuring.  Please return to the Emergency Department if you experience any worsening of your condition.  Thank you for allowing [place] to be a part of your care.        Sabas Sous, MD 03/12/22 [id number]

## 2022-03-12 NOTE — Discharge Instructions (Signed)
You were evaluated in the Emergency Department and after careful evaluation, we did not find any emergent condition requiring admission or further testing in the hospital.  Your exam/testing today was overall reassuring.  Please return to the Emergency Department if you experience any worsening of your condition.  Thank you for allowing us to be a part of your care.  

## 2022-03-14 ENCOUNTER — Emergency Department (HOSPITAL_COMMUNITY)
Admission: EM | Admit: 2022-03-14 | Discharge: 2022-03-14 | Discharge status: Against Medical Advice | Payer: Medicaid Other

## 2022-03-14 NOTE — ED Notes (Signed)
Pt called multiple times for triage, no answer. 

## 2022-03-14 NOTE — ED Triage Notes (Signed)
PER EMS: pt reports bilateral leg and foot swelling associated with pain for 2 days. He has not taken his Digoxin or Torsemide for an unknown amount of time.   BP-138/90, HR-90, 02-99%

## 2022-03-15 ENCOUNTER — Emergency Department (HOSPITAL_COMMUNITY): Payer: Medicaid Other

## 2022-03-15 ENCOUNTER — Other Ambulatory Visit: Payer: Self-pay

## 2022-03-15 ENCOUNTER — Inpatient Hospital Stay (HOSPITAL_COMMUNITY)
Admission: EM | Admit: 2022-03-15 | Discharge: 2022-03-21 | DRG: 291 | Disposition: A | Discharge status: Home / Self Care | Payer: Medicaid Other | Attending: Internal Medicine | Admitting: Internal Medicine

## 2022-03-15 ENCOUNTER — Encounter (HOSPITAL_COMMUNITY): Payer: Self-pay

## 2022-03-15 DIAGNOSIS — F419 Anxiety disorder, unspecified: Secondary | ICD-10-CM | POA: Diagnosis present

## 2022-03-15 DIAGNOSIS — Z7982 Long term (current) use of aspirin: Secondary | ICD-10-CM

## 2022-03-15 DIAGNOSIS — J449 Chronic obstructive pulmonary disease, unspecified: Secondary | ICD-10-CM | POA: Diagnosis present

## 2022-03-15 DIAGNOSIS — J4489 Other specified chronic obstructive pulmonary disease: Secondary | ICD-10-CM | POA: Diagnosis present

## 2022-03-15 DIAGNOSIS — L03115 Cellulitis of right lower limb: Secondary | ICD-10-CM | POA: Diagnosis present

## 2022-03-15 DIAGNOSIS — K219 Gastro-esophageal reflux disease without esophagitis: Secondary | ICD-10-CM | POA: Diagnosis present

## 2022-03-15 DIAGNOSIS — F259 Schizoaffective disorder, unspecified: Secondary | ICD-10-CM | POA: Diagnosis present

## 2022-03-15 DIAGNOSIS — I428 Other cardiomyopathies: Secondary | ICD-10-CM | POA: Diagnosis present

## 2022-03-15 DIAGNOSIS — Z7951 Long term (current) use of inhaled steroids: Secondary | ICD-10-CM

## 2022-03-15 DIAGNOSIS — E785 Hyperlipidemia, unspecified: Secondary | ICD-10-CM | POA: Diagnosis present

## 2022-03-15 DIAGNOSIS — F17210 Nicotine dependence, cigarettes, uncomplicated: Secondary | ICD-10-CM | POA: Diagnosis present

## 2022-03-15 DIAGNOSIS — Z720 Tobacco use: Secondary | ICD-10-CM

## 2022-03-15 DIAGNOSIS — M7989 Other specified soft tissue disorders: Secondary | ICD-10-CM

## 2022-03-15 DIAGNOSIS — M1990 Unspecified osteoarthritis, unspecified site: Secondary | ICD-10-CM | POA: Diagnosis present

## 2022-03-15 DIAGNOSIS — I130 Hypertensive heart and chronic kidney disease with heart failure and stage 1 through stage 4 chronic kidney disease, or unspecified chronic kidney disease: Secondary | ICD-10-CM | POA: Diagnosis present

## 2022-03-15 DIAGNOSIS — Z5902 Unsheltered homelessness: Secondary | ICD-10-CM

## 2022-03-15 DIAGNOSIS — I5043 Acute on chronic combined systolic (congestive) and diastolic (congestive) heart failure: Secondary | ICD-10-CM | POA: Diagnosis present

## 2022-03-15 DIAGNOSIS — Z888 Allergy status to other drugs, medicaments and biological substances status: Secondary | ICD-10-CM

## 2022-03-15 DIAGNOSIS — J431 Panlobular emphysema: Secondary | ICD-10-CM | POA: Diagnosis not present

## 2022-03-15 DIAGNOSIS — N183 Chronic kidney disease, stage 3 unspecified: Secondary | ICD-10-CM | POA: Diagnosis present

## 2022-03-15 DIAGNOSIS — Z91199 Patient's noncompliance with other medical treatment and regimen due to unspecified reason: Secondary | ICD-10-CM

## 2022-03-15 DIAGNOSIS — L03119 Cellulitis of unspecified part of limb: Secondary | ICD-10-CM

## 2022-03-15 DIAGNOSIS — Z8249 Family history of ischemic heart disease and other diseases of the circulatory system: Secondary | ICD-10-CM | POA: Diagnosis not present

## 2022-03-15 DIAGNOSIS — L0390 Cellulitis, unspecified: Secondary | ICD-10-CM | POA: Diagnosis present

## 2022-03-15 DIAGNOSIS — I509 Heart failure, unspecified: Principal | ICD-10-CM

## 2022-03-15 DIAGNOSIS — I2729 Other secondary pulmonary hypertension: Secondary | ICD-10-CM | POA: Diagnosis present

## 2022-03-15 DIAGNOSIS — Z8679 Personal history of other diseases of the circulatory system: Secondary | ICD-10-CM

## 2022-03-15 DIAGNOSIS — I5082 Biventricular heart failure: Secondary | ICD-10-CM | POA: Diagnosis present

## 2022-03-15 DIAGNOSIS — F32A Depression, unspecified: Secondary | ICD-10-CM | POA: Diagnosis present

## 2022-03-15 DIAGNOSIS — Z79899 Other long term (current) drug therapy: Secondary | ICD-10-CM

## 2022-03-15 DIAGNOSIS — I252 Old myocardial infarction: Secondary | ICD-10-CM

## 2022-03-15 DIAGNOSIS — Z7984 Long term (current) use of oral hypoglycemic drugs: Secondary | ICD-10-CM

## 2022-03-15 DIAGNOSIS — Z7901 Long term (current) use of anticoagulants: Secondary | ICD-10-CM

## 2022-03-15 DIAGNOSIS — Z86718 Personal history of other venous thrombosis and embolism: Secondary | ICD-10-CM

## 2022-03-15 DIAGNOSIS — F1410 Cocaine abuse, uncomplicated: Secondary | ICD-10-CM | POA: Diagnosis present

## 2022-03-15 LAB — COMPREHENSIVE METABOLIC PANEL
ALT: 19 U/L (ref 0–44)
AST: 20 U/L (ref 15–41)
Albumin: 2.9 g/dL — ABNORMAL LOW (ref 3.5–5.0)
Alkaline Phosphatase: 106 U/L (ref 38–126)
Anion gap: 12 (ref 5–15)
BUN: 26 mg/dL — ABNORMAL HIGH (ref 8–23)
CO2: 22 mmol/L (ref 22–32)
Calcium: 9.6 mg/dL (ref 8.9–10.3)
Chloride: 107 mmol/L (ref 98–111)
Creatinine, Ser: 1.18 mg/dL (ref 0.61–1.24)
GFR, Estimated: >60 mL/min (ref >60)
Glucose, Bld: 108 mg/dL — ABNORMAL HIGH (ref 70–99)
Potassium: 4.8 mmol/L (ref 3.5–5.1)
Sodium: 141 mmol/L (ref 135–145)
Total Bilirubin: 0.8 mg/dL (ref 0.3–1.2)
Total Protein: 7.1 g/dL (ref 6.5–8.1)

## 2022-03-15 LAB — CBC WITH DIFFERENTIAL/PLATELET
Abs Immature Granulocytes: 0.02 10*3/uL (ref 0.00–0.07)
Basophils Absolute: 0 10*3/uL (ref 0.0–0.1)
Basophils Relative: 1 %
Eosinophils Absolute: 0.1 10*3/uL (ref 0.0–0.5)
Eosinophils Relative: 2 %
HCT: 46.7 % (ref 39.0–52.0)
Hemoglobin: 14.7 g/dL (ref 13.0–17.0)
Immature Granulocytes: 0 %
Lymphocytes Relative: 21 %
Lymphs Abs: 1.2 10*3/uL (ref 0.7–4.0)
MCH: 27.8 pg (ref 26.0–34.0)
MCHC: 31.5 g/dL (ref 30.0–36.0)
MCV: 88.4 fL (ref 80.0–100.0)
Monocytes Absolute: 0.4 10*3/uL (ref 0.1–1.0)
Monocytes Relative: 8 %
Neutro Abs: 3.8 10*3/uL (ref 1.7–7.7)
Neutrophils Relative %: 68 %
Platelets: 359 10*3/uL (ref 150–400)
RBC: 5.28 MIL/uL (ref 4.22–5.81)
RDW: 15.6 % — ABNORMAL HIGH (ref 11.5–15.5)
WBC: 5.6 10*3/uL (ref 4.0–10.5)
nRBC: 0 % (ref 0.0–0.2)

## 2022-03-15 LAB — ETHANOL: Alcohol, Ethyl (B): <10 mg/dL (ref <10)

## 2022-03-15 LAB — CBG MONITORING, ED: Glucose-Capillary: 104 mg/dL — ABNORMAL HIGH (ref 70–99)

## 2022-03-15 LAB — BRAIN NATRIURETIC PEPTIDE: B Natriuretic Peptide: 3259.1 pg/mL — ABNORMAL HIGH (ref 0.0–100.0)

## 2022-03-15 MED ORDER — BENZTROPINE MESYLATE 1 MG PO TABS
1.0000 mg | ORAL_TABLET | Freq: Two times a day (BID) | ORAL | Status: DC
  Administered 2022-03-15 – 2022-03-21 (×11): 1 mg via ORAL
  Filled 2022-03-15 (×12): qty 1

## 2022-03-15 MED ORDER — SODIUM CHLORIDE 0.9 % IV SOLN
1.0000 g | INTRAVENOUS | Status: DC
  Administered 2022-03-15 – 2022-03-17 (×3): 1 g via INTRAVENOUS
  Filled 2022-03-15 (×3): qty 10

## 2022-03-15 MED ORDER — ALBUTEROL SULFATE (2.5 MG/3ML) 0.083% IN NEBU: 2.5000 mg | INHALATION_SOLUTION | Freq: Four times a day (QID) | RESPIRATORY_TRACT | Status: DC | PRN

## 2022-03-15 MED ORDER — EMPAGLIFLOZIN 10 MG PO TABS
10.0000 mg | ORAL_TABLET | Freq: Every day | ORAL | Status: DC
  Administered 2022-03-15 – 2022-03-21 (×7): 10 mg via ORAL
  Filled 2022-03-15 (×7): qty 1

## 2022-03-15 MED ORDER — FUROSEMIDE 10 MG/ML IJ SOLN
40.0000 mg | Freq: Two times a day (BID) | INTRAMUSCULAR | Status: DC
  Administered 2022-03-15 – 2022-03-18 (×6): 40 mg via INTRAVENOUS
  Filled 2022-03-15 (×6): qty 4

## 2022-03-15 MED ORDER — CITALOPRAM HYDROBROMIDE 20 MG PO TABS
20.0000 mg | ORAL_TABLET | Freq: Every day | ORAL | Status: DC
  Administered 2022-03-15 – 2022-03-21 (×7): 20 mg via ORAL
  Filled 2022-03-15 (×2): qty 1
  Filled 2022-03-15: qty 2
  Filled 2022-03-15 (×3): qty 1
  Filled 2022-03-15: qty 2

## 2022-03-15 MED ORDER — SODIUM CHLORIDE 0.9% FLUSH
3.0000 mL | Freq: Two times a day (BID) | INTRAVENOUS | Status: DC
  Administered 2022-03-15 – 2022-03-21 (×11): 3 mL via INTRAVENOUS

## 2022-03-15 MED ORDER — ATORVASTATIN CALCIUM 80 MG PO TABS
80.0000 mg | ORAL_TABLET | Freq: Every day | ORAL | Status: DC
  Administered 2022-03-15 – 2022-03-21 (×7): 80 mg via ORAL
  Filled 2022-03-15 (×7): qty 1

## 2022-03-15 MED ORDER — PANTOPRAZOLE SODIUM 40 MG PO TBEC
40.0000 mg | DELAYED_RELEASE_TABLET | Freq: Every day | ORAL | Status: DC
  Administered 2022-03-15 – 2022-03-21 (×7): 40 mg via ORAL
  Filled 2022-03-15 (×7): qty 1

## 2022-03-15 MED ORDER — GABAPENTIN 400 MG PO CAPS: 400.0000 mg | ORAL_CAPSULE | Freq: Every day | ORAL | Status: DC | PRN

## 2022-03-15 MED ORDER — LOSARTAN POTASSIUM 25 MG PO TABS
25.0000 mg | ORAL_TABLET | Freq: Every day | ORAL | Status: DC
  Administered 2022-03-16 – 2022-03-21 (×6): 25 mg via ORAL
  Filled 2022-03-15 (×6): qty 1

## 2022-03-15 MED ORDER — ACETAMINOPHEN 325 MG PO TABS: 650.0000 mg | ORAL_TABLET | Freq: Four times a day (QID) | ORAL | Status: DC | PRN

## 2022-03-15 MED ORDER — ASPIRIN 81 MG PO TBEC
81.0000 mg | DELAYED_RELEASE_TABLET | Freq: Every day | ORAL | Status: DC
  Administered 2022-03-15 – 2022-03-21 (×7): 81 mg via ORAL
  Filled 2022-03-15 (×7): qty 1

## 2022-03-15 MED ORDER — SPIRONOLACTONE 12.5 MG HALF TABLET
12.5000 mg | ORAL_TABLET | Freq: Every day | ORAL | Status: DC
  Administered 2022-03-15 – 2022-03-21 (×7): 12.5 mg via ORAL
  Filled 2022-03-15 (×7): qty 1

## 2022-03-15 MED ORDER — UMECLIDINIUM BROMIDE 62.5 MCG/ACT IN AEPB
1.0000 | INHALATION_SPRAY | Freq: Every day | RESPIRATORY_TRACT | Status: DC
  Administered 2022-03-20 – 2022-03-21 (×2): 1 via RESPIRATORY_TRACT
  Filled 2022-03-15 (×2): qty 7

## 2022-03-15 MED ORDER — RIVAROXABAN 20 MG PO TABS
20.0000 mg | ORAL_TABLET | Freq: Every day | ORAL | Status: DC
  Administered 2022-03-15 – 2022-03-20 (×6): 20 mg via ORAL
  Filled 2022-03-15 (×2): qty 1
  Filled 2022-03-15: qty 2
  Filled 2022-03-15 (×3): qty 1

## 2022-03-15 MED ORDER — MOMETASONE FURO-FORMOTEROL FUM 200-5 MCG/ACT IN AERO
2.0000 | INHALATION_SPRAY | Freq: Two times a day (BID) | RESPIRATORY_TRACT | Status: DC
  Administered 2022-03-15 – 2022-03-21 (×8): 2 via RESPIRATORY_TRACT
  Filled 2022-03-15 (×2): qty 8.8

## 2022-03-15 MED ORDER — ARIPIPRAZOLE 5 MG PO TABS
10.0000 mg | ORAL_TABLET | Freq: Every day | ORAL | Status: DC
  Administered 2022-03-15 – 2022-03-21 (×7): 10 mg via ORAL
  Filled 2022-03-15 (×3): qty 2
  Filled 2022-03-15: qty 1
  Filled 2022-03-15: qty 2
  Filled 2022-03-15: qty 1
  Filled 2022-03-15: qty 2

## 2022-03-15 MED ORDER — NICOTINE 21 MG/24HR TD PT24
21.0000 mg | MEDICATED_PATCH | Freq: Every day | TRANSDERMAL | Status: DC
  Administered 2022-03-15 – 2022-03-21 (×3): 21 mg via TRANSDERMAL
  Filled 2022-03-15 (×5): qty 1

## 2022-03-15 MED ORDER — DIGOXIN 125 MCG PO TABS
0.1250 mg | ORAL_TABLET | Freq: Every day | ORAL | Status: DC
  Administered 2022-03-15 – 2022-03-21 (×6): 0.125 mg via ORAL
  Filled 2022-03-15 (×7): qty 1

## 2022-03-15 MED ORDER — ACETAMINOPHEN 650 MG RE SUPP: 650.0000 mg | Freq: Four times a day (QID) | RECTAL | Status: DC | PRN

## 2022-03-15 MED ORDER — FUROSEMIDE 10 MG/ML IJ SOLN
40.0000 mg | Freq: Once | INTRAMUSCULAR | Status: AC
  Administered 2022-03-15: 40 mg via INTRAVENOUS
  Filled 2022-03-15: qty 4

## 2022-03-15 NOTE — ED Triage Notes (Addendum)
Pt arrived POV c/o bilateral leg swelling and pain x2 days. Pt is also intoxicated.

## 2022-03-15 NOTE — ED Notes (Signed)
Pt in room A&O x4 with a hacking productive cough and bilateral leg swelling. +3 Pitting edema. Updated on plan of care. Room adjusted for comfort.

## 2022-03-15 NOTE — Progress Notes (Signed)
Heart Failure Navigator Progress Note  Assessed for Heart & Vascular TOC clinic readiness.  Patient does not meet criteria due to pt has missed last 3 scheduled HV TOC appts as a no show, no answer. Pt will pretend to sleep during previous interactions stating "just waiting for you to leave". Significant other interactive in the past.   If pt shows interest and initiative to attend his follow up appointments, please re-consult.   Navigator available for reassessment of patient.   Ozella Rocks, MSN, RN Heart Failure Nurse Navigator

## 2022-03-15 NOTE — ED Provider Triage Note (Signed)
Emergency Medicine Provider Triage Evaluation Note  Allen Mayer , a 61 y.o. male  was evaluated in triage.  Pt complains of bilateral leg swelling for several weeks.  Prior history of CHF, unable to put his shoes on lately as his ankles are severely swollen.  Also endorsing shortness of breath with any type of movement.  He does have an ongoing history of alcohol abuse, currently clinically intoxicated.  Has not tried anything for symptomatic control..  Review of Systems  Positive: Leg swelling, sob Negative: Chest pain, cough, fever  Physical Exam  BP (!) 125/106 (BP Location: Right Arm)   Pulse 98   Temp 98 F (36.7 C)   Resp 18   Ht 6' (1.829 m)   Wt 72.6 kg   SpO2 100%   BMI 21.70 kg/m  Gen:   Awake, no distress   Resp:  Normal effort  MSK:   Moves extremities without difficulty  Other:  2+ pitting edema, right calf tenderness, lungs are clear to auscultation  Medical Decision Making  Medically screening exam initiated at 7:42 AM.  Appropriate orders placed.  Allen Mayer was informed that the remainder of the evaluation will be completed by another provider, this initial triage assessment does not replace that evaluation, and the importance of remaining in the ED until their evaluation is complete.     Claude Manges, PA-C 03/15/22 0745

## 2022-03-15 NOTE — ED Provider Notes (Signed)
[facility] EMERGENCY DEPARTMENT Provider Note   CSN: [id number] Arrival date & time: 03/15/22  1884     History  Chief Complaint  Patient presents with   Leg Swelling    Allen Mayer is a 61 y.o. male with a past medical history significant for congestive heart failure, LV thrombosis, CKD, COPD, and hypertension who presents to the ED due to lower extremity edema x2 weeks.  Very difficult to obtain HPI given patient just says "no" to every question I ask.  Patient denies chest pain and shortness of breath.  Patient recently admitted to hospital on 11/16 to 11/20 due to CHF exacerbation.  Patient is currently on torsemide which he notes he occasionally takes.  He notes he has not taken it in the past few days.  Per triage note, patient intoxicated in triage however, patient states he has not had anything to drink in the past few days. Ethanol level normal.  He notes he never drank daily. Denies speech changes, visual changes, or unilateral weakness.  History obtained from patient and past medical records. No interpreter used during encounter.       Home Medications Prior to Admission medications   Medication Sig Start Date End Date Taking? Authorizing Provider  albuterol (VENTOLIN HFA) 108 (90 Base) MCG/ACT inhaler Inhale 1-2 puffs into the lungs every 6 (six) hours as needed for wheezing or shortness of breath. 01/17/22   Rolly Salter, MD  ARIPiprazole (ABILIFY) 10 MG tablet Take 1 tablet (10 mg total) by mouth daily. 01/17/22   Rolly Salter, MD  aspirin EC 81 MG tablet Take 1 tablet (81 mg total) by mouth daily. Swallow whole. 01/17/22   Rolly Salter, MD  atorvastatin (LIPITOR) 80 MG tablet Take 1 tablet (80 mg total) by mouth daily. 01/17/22   Rolly Salter, MD  benzonatate (TESSALON) 100 MG capsule Take 1 capsule (100 mg total) by mouth 3 (three) times daily. 01/17/22   Rolly Salter, MD  benztropine (COGENTIN) 1 MG tablet Take 1 tablet (1 mg total) by  mouth 2 (two) times daily. 01/17/22   Rolly Salter, MD  citalopram (CELEXA) 20 MG tablet Take 1 tablet (20 mg total) by mouth daily. 01/17/22   Rolly Salter, MD  dextromethorphan-guaiFENesin [facility] DM) 30-600 MG 12hr tablet Take 1 tablet by mouth 2 (two) times daily. Patient taking differently: Take 1 tablet by mouth 2 (two) times daily as needed for cough. 01/17/22   Rolly Salter, MD  digoxin (LANOXIN) 0.125 MG tablet Take 1 tablet (0.125 mg total) by mouth daily. 02/28/22 03/30/22  Arrien, York Ram, MD  empagliflozin (JARDIANCE) 10 MG TABS tablet Take 1 tablet (10 mg total) by mouth daily. 02/28/22   Arrien, York Ram, MD  gabapentin (NEURONTIN) 400 MG capsule Take 1 capsule (400 mg total) by mouth daily as needed (nerve pain). 12/20/21   Geoffery Lyons, MD  losartan (COZAAR) 25 MG tablet Take 1 tablet (25 mg total) by mouth daily. 02/28/22 03/30/22  Arrien, York Ram, MD  mometasone-formoterol [facility]) 200-5 MCG/ACT AERO Inhale 2 puffs into the lungs 2 (two) times daily. 02/28/22   Arrien, York Ram, MD  nitroGLYCERIN (NITROSTAT) 0.4 MG SL tablet Place 1 tablet (0.4 mg total) under the tongue every 5 (five) minutes as needed for chest pain. 10/15/21   Rollene Rotunda, MD  pantoprazole (PROTONIX) 40 MG tablet Take 1 tablet (40 mg total) by mouth daily. 01/17/22   Rolly Salter, MD  rivaroxaban (  XARELTO) 20 MG TABS tablet Take 1 tablet (20 mg total) by mouth daily with supper. 01/17/22   Rolly Salter, MD  spironolactone (ALDACTONE) 25 MG tablet Take 1/2 tablets (12.5 mg total) by mouth daily. 02/28/22 03/30/22  Arrien, York Ram, MD  torsemide (DEMADEX) 20 MG tablet Take 1 tablet (20 mg total) by mouth daily. 02/28/22 03/30/22  Arrien, York Ram, MD  umeclidinium bromide (INCRUSE ELLIPTA) 62.5 MCG/ACT AEPB Inhale 1 puff into the lungs daily. 01/18/22   Rolly Salter, MD      Allergies    Flexeril [cyclobenzaprine]    Review of Systems   Review of  Systems  Constitutional:  Negative for chills and fever.  Respiratory:  Negative for shortness of breath.   Cardiovascular:  Positive for leg swelling. Negative for chest pain.  Gastrointestinal:  Negative for abdominal pain, diarrhea, nausea and vomiting.  All other systems reviewed and are negative.   Physical Exam Updated Vital Signs BP (!) 127/97   Pulse 86   Temp 98 F (36.7 C) (Oral)   Resp 19   Ht 6' (1.829 m)   Wt 72.6 kg   SpO2 98%   BMI 21.70 kg/m  Physical Exam Vitals and nursing note reviewed.  Constitutional:      General: He is not in acute distress.    Appearance: He is not ill-appearing.  HENT:     Head: Normocephalic.  Eyes:     Pupils: Pupils are equal, round, and reactive to light.  Cardiovascular:     Rate and Rhythm: Normal rate and regular rhythm.     Pulses: Normal pulses.     Heart sounds: Normal heart sounds. No murmur heard.    No friction rub. No gallop.  Pulmonary:     Effort: Pulmonary effort is normal.     Breath sounds: Normal breath sounds.  Abdominal:     General: Abdomen is flat. There is no distension.     Palpations: Abdomen is soft.     Tenderness: There is no abdominal tenderness. There is no guarding or rebound.  Musculoskeletal:        General: Normal range of motion.     Cervical back: Neck supple.     Comments: 2+ pitting edema bilaterally  Skin:    General: Skin is warm and dry.  Neurological:     General: No focal deficit present.     Mental Status: He is alert.  Psychiatric:        Mood and Affect: Mood normal.        Behavior: Behavior normal.     ED Results / Procedures / Treatments   Labs (all labs ordered are listed, but only abnormal results are displayed) Labs Reviewed  CBC WITH DIFFERENTIAL/PLATELET - Abnormal; Notable for the following components:      Result Value   RDW 15.6 (*)    All other components within normal limits  COMPREHENSIVE METABOLIC PANEL - Abnormal; Notable for the following  components:   Glucose, Bld 108 (*)    BUN 26 (*)    Albumin 2.9 (*)    All other components within normal limits  BRAIN NATRIURETIC PEPTIDE - Abnormal; Notable for the following components:   B Natriuretic Peptide 3,259.1 (*)    All other components within normal limits  CBG MONITORING, ED - Abnormal; Notable for the following components:   Glucose-Capillary 104 (*)    All other components within normal limits  ETHANOL    EKG None  Radiology DG Chest 2 View  Result Date: 03/15/2022 CLINICAL DATA:  Shortness of breath with bilateral leg swelling several weeks. EXAM: CHEST - 2 VIEW COMPARISON:  02/25/2022 FINDINGS: Lungs are adequately inflated and otherwise clear. Mild to moderate cardiomegaly unchanged. Remainder of the exam is unchanged. IMPRESSION: 1. No active cardiopulmonary disease. 2. Mild to moderate cardiomegaly unchanged. Electronically Signed   By: Elberta Fortis M.D.   On: 03/15/2022 08:37    Procedures Procedures    Medications Ordered in ED Medications  furosemide (LASIX) injection 40 mg (has no administration in time range)  sodium chloride flush (NS) 0.9 % injection 3 mL (has no administration in time range)  acetaminophen (TYLENOL) tablet 650 mg (has no administration in time range)    Or  acetaminophen (TYLENOL) suppository 650 mg (has no administration in time range)  albuterol (PROVENTIL) (2.5 MG/3ML) 0.083% nebulizer solution 2.5 mg (has no administration in time range)  furosemide (LASIX) injection 40 mg (40 mg Intravenous Given 03/15/22 1407)    ED Course/ Medical Decision Making/ A&P Clinical Course as of 03/15/22 1441  Tue Mar 15, 2022  1342 B Natriuretic Peptide(!): 3,259.1 [CA]  1342 Alcohol, Ethyl (B): <10 [CA]    Clinical Course User Index [CA] Mannie Stabile, PA-C                           Medical Decision Making Amount and/or Complexity of Data Reviewed External Data Reviewed: notes.    Details: Recent admission notes Labs: ordered.  Decision-making details documented in ED Course. Radiology: ordered and independent interpretation performed. Decision-making details documented in ED Course. ECG/medicine tests: ordered and independent interpretation performed. Decision-making details documented in ED Course.  Risk Prescription drug management. Decision regarding hospitalization.   This patient presents to the ED for concern of lower extremity edema, this involves an extensive number of treatment options, and is a complaint that carries with it a high risk of complications and morbidity.  The differential diagnosis includes CHF exacerbation, cellulitis, DVT, venous stasis, etc  61 year old male presents to the ED due to bilateral lower extremity edema x2 2 weeks.  Patient denies chest pain and shortness of breath.  Patient recently admitted the hospital less than a month ago for CHF exacerbation.  Patient is currently homeless.  Patient states he occasionally takes his torsemide.  Very difficult to obtain HPI given patient just says "no" to every question I ask.  Upon arrival, stable vitals.  Patient in no acute distress.  Physical exam significant for 2+ pitting edema bilaterally.  Routine labs ordered.  BNP to rule out CHF exacerbation.  Chest x-ray to rule out evidence of fluid overload versus pneumonia.  IV Lasix given.  CBC reassuring.  No leukocytosis and normal hemoglobin.  CMP reassuring.  Mild elevated BUN at 26.  No major electrolyte derangements.  Ethanol within normal limits.  Normal neurological exam without any neurological deficits. Unsure why triage noted patient to be intoxicated?  BNP elevated at 3259.  Suspect symptoms related to CHF exacerbation.  Feel patient will benefit from admission given patient is currently homeless and has no follow-up.  Will discuss with hospitalist for admission.  Low suspicion for cellulitis or DVT.  2:21 PM Discussed with Dr. Katrinka Blazing with [facility] who agrees to admit patient.   Discussed  with Dr. Durwin Nora who agrees with assessment and plan.       Final Clinical Impression(s) / ED Diagnoses Final diagnoses:  Acute on chronic  congestive heart failure, unspecified heart failure type [facility])  Leg swelling    Rx / DC Orders ED Discharge Orders     None         Jesusita Oka 03/15/22 1442    Gloris Manchester, MD 03/15/22 1719

## 2022-03-15 NOTE — H&P (Addendum)
History and Physical    Patient: Allen Mayer SWF:[id number] DOB: 09/01/1960 DOA: 03/15/2022 DOS: the patient was seen and examined on 03/15/2022 PCP: Patient, No Pcp Per  Patient coming from:  Chief Complaint:  Chief Complaint  Patient presents with   Leg Swelling   HPI: Allen Mayer is a 61 y.o. male with medical history significant of  hypertension, chronic HFrEF with LVEF 10-20% with LV thrombosis, CKD stage III, COPD, cigarette smoker, and cocaine abuse presents with complaints of cramps and swelling in legs which started 2 days ago.  He reports that he has been taking the medication, but does admit that he does miss taking doses occasionally.  Denies having any significant chest pain or shortness of breath.  He reports that he just noticed that his legs were red in color today.  Patient reports that he has not done cocaine or drink alcohol in a couple weeks.  He was last admitted for Acute on chronic systolic and diastolic CHF exacerbation last month requiring IV diuresis.  In the emergency department patient was found to be afebrile with blood pressures 127/97, and all other vital signs maintained.  Labs significant for BNP 3259.1.  Chest x-ray noted mild to moderate cardiomegaly without acute cardiopulmonary disease.  Patient was given Lasix 40 mg IV x 1 dose.  Review of Systems: As mentioned in the history of present illness. All other systems reviewed and are negative. Past Medical History:  Diagnosis Date   Acid reflux    Alcohol abuse    Arthritis    Asthma    Back pain    Bronchitis    Chest pain 07/31/2021   CHF (congestive heart failure) (HCC)    CKD (chronic kidney disease)    CKD (chronic kidney disease), stage III (HCC)    Cocaine abuse (HCC)    COPD (chronic obstructive pulmonary disease) (HCC)    History of noncompliance with medical treatment, presenting hazards to health    Homelessness    Hypertension    LV (left ventricular) mural thrombus     Myocardial infarction (HCC)    Neuropathic pain    NICM (nonischemic cardiomyopathy) (HCC) 2019   NSTEMI (non-ST elevated myocardial infarction) (HCC) 07/31/2021   NSVT (nonsustained ventricular tachycardia) (HCC)    Pericardial effusion    Pulmonary hypertension (HCC)    RVF (right ventricular failure) (HCC)    Schizo affective schizophrenia (HCC)    Past Surgical History:  Procedure Laterality Date   I & D EXTREMITY Bilateral 12/14/2020   Procedure: IRRIGATION AND DEBRIDEMENT AND CLOSURE OF LACERTATIONS OF BILATEAL ARMS;  Surgeon: Fritzi Mandes, MD;  Location: [facility] OR;  Service: General;  Laterality: Bilateral;   LAPAROTOMY N/A 12/14/2020   Procedure: EXPLORATORY LAPAROTOMY;  Surgeon: Fritzi Mandes, MD;  Location: [facility] OR;  Service: General;  Laterality: N/A;   None     RIGHT HEART CATH AND CORONARY ANGIOGRAPHY N/A 08/02/2021   Procedure: RIGHT HEART CATH AND CORONARY ANGIOGRAPHY;  Surgeon: Yvonne Kendall, MD;  Location: [facility] INVASIVE CV LAB;  Service: Cardiovascular;  Laterality: N/A;   RIGHT/LEFT HEART CATH AND CORONARY ANGIOGRAPHY N/A 12/13/2017   Procedure: RIGHT/LEFT HEART CATH AND CORONARY ANGIOGRAPHY;  Surgeon: Runell Gess, MD;  Location: [facility] INVASIVE CV LAB;  Service: Cardiovascular;  Laterality: N/A;   Social History:  reports that he has been smoking cigarettes. He has been smoking an average of .5 packs per day. He has never used smokeless tobacco. He reports current alcohol use. He reports current  drug use. Drugs: Marijuana and Cocaine.  Allergies  Allergen Reactions   Flexeril [Cyclobenzaprine] Other (See Comments)    Caused cramping and patient did not like the way it made it feel. Requests this medication to NOT be given to him.    Family History  Problem Relation Age of Onset   Heart attack Mother        Died age 52   Heart attack Brother        34    Prior to Admission medications   Medication Sig Start Date End Date Taking? Authorizing Provider  albuterol  (VENTOLIN HFA) 108 (90 Base) MCG/ACT inhaler Inhale 1-2 puffs into the lungs every 6 (six) hours as needed for wheezing or shortness of breath. 01/17/22   Rolly Salter, MD  ARIPiprazole (ABILIFY) 10 MG tablet Take 1 tablet (10 mg total) by mouth daily. 01/17/22   Rolly Salter, MD  aspirin EC 81 MG tablet Take 1 tablet (81 mg total) by mouth daily. Swallow whole. 01/17/22   Rolly Salter, MD  atorvastatin (LIPITOR) 80 MG tablet Take 1 tablet (80 mg total) by mouth daily. 01/17/22   Rolly Salter, MD  benzonatate (TESSALON) 100 MG capsule Take 1 capsule (100 mg total) by mouth 3 (three) times daily. 01/17/22   Rolly Salter, MD  benztropine (COGENTIN) 1 MG tablet Take 1 tablet (1 mg total) by mouth 2 (two) times daily. 01/17/22   Rolly Salter, MD  citalopram (CELEXA) 20 MG tablet Take 1 tablet (20 mg total) by mouth daily. 01/17/22   Rolly Salter, MD  dextromethorphan-guaiFENesin [facility] DM) 30-600 MG 12hr tablet Take 1 tablet by mouth 2 (two) times daily. Patient taking differently: Take 1 tablet by mouth 2 (two) times daily as needed for cough. 01/17/22   Rolly Salter, MD  digoxin (LANOXIN) 0.125 MG tablet Take 1 tablet (0.125 mg total) by mouth daily. 02/28/22 03/30/22  Arrien, York Ram, MD  empagliflozin (JARDIANCE) 10 MG TABS tablet Take 1 tablet (10 mg total) by mouth daily. 02/28/22   Arrien, York Ram, MD  gabapentin (NEURONTIN) 400 MG capsule Take 1 capsule (400 mg total) by mouth daily as needed (nerve pain). 12/20/21   Geoffery Lyons, MD  losartan (COZAAR) 25 MG tablet Take 1 tablet (25 mg total) by mouth daily. 02/28/22 03/30/22  Arrien, York Ram, MD  mometasone-formoterol [facility]) 200-5 MCG/ACT AERO Inhale 2 puffs into the lungs 2 (two) times daily. 02/28/22   Arrien, York Ram, MD  nitroGLYCERIN (NITROSTAT) 0.4 MG SL tablet Place 1 tablet (0.4 mg total) under the tongue every 5 (five) minutes as needed for chest pain. 10/15/21   Rollene Rotunda, MD   pantoprazole (PROTONIX) 40 MG tablet Take 1 tablet (40 mg total) by mouth daily. 01/17/22   Rolly Salter, MD  rivaroxaban (XARELTO) 20 MG TABS tablet Take 1 tablet (20 mg total) by mouth daily with supper. 01/17/22   Rolly Salter, MD  spironolactone (ALDACTONE) 25 MG tablet Take 1/2 tablets (12.5 mg total) by mouth daily. 02/28/22 03/30/22  Arrien, York Ram, MD  torsemide (DEMADEX) 20 MG tablet Take 1 tablet (20 mg total) by mouth daily. 02/28/22 03/30/22  Arrien, York Ram, MD  umeclidinium bromide (INCRUSE ELLIPTA) 62.5 MCG/ACT AEPB Inhale 1 puff into the lungs daily. 01/18/22   Rolly Salter, MD    Physical Exam: Vitals:   03/15/22 0718 03/15/22 0737 03/15/22 1043 03/15/22 1355  BP: (!) 125/106  (!) 124/93 (!) 127/97  Pulse: 98  86 86  Resp: 18  18 19   Temp: 98 F (36.7 C)  98 F (36.7 C)   TempSrc:   Oral   SpO2: 100%  99% 98%  Weight:  72.6 kg    Height:  6' (1.829 m)    Eyes: PERRL, lids and conjunctivae normal ENMT: Mucous membranes are moist. Posterior pharynx clear of any exudate or lesions.fair dentition. Neck: normal, supple  Respiratory: Normal respiratory effort without significant wheezes appreciated.  O2 saturation currently maintained on room air. Cardiovascular: Regular rate and rhythm, with positive systolic murmur.  At least 2 to plus pitting bilateral lower extremity edema. Abdomen: no tenderness, no masses palpated.  Bowel sounds appreciated. Musculoskeletal: no clubbing / cyanosis. No joint deformity upper and lower extremities. Good ROM, no contractures. Normal muscle tone.  Skin: Erythema present of the bilateral lower extremities with increased warmth. Neurologic: CN 2-12 grossly intact.   Strength 5/5 in all 4.  Psychiatric: Normal judgment and insight. Alert and oriented x 3. Normal mood.    Data Reviewed:  Sinus rhythm 89 bpm with first-degree heart block.  Reviewed labs, imaging, and pertinent records as noted above in  HPI  Assessment and Plan: Acute on chronic systolic and diastolic congestive heart failure Patient presents with complaints of swelling of the bilateral legs and cramps.  On physical exam patient with at least 2+ pitting bilateral lower extremity edema.  BNP elevated at 3259.  chest x-ray does cardiomegaly with acute abnormality.  Echocardiogram had noted EF to be less than 20% with global hypokinesis, RV with severe reduction in systolic function, and moderately dilated atriums bilaterally. -Admit to a telemetry bed -Heart failure order set utilized -Strict I&O's and daily weights -Lasix 40 mg IV twice daily.  Reassess in a.m. and adjust IV diuresis as needed -Continue ARB and spironolactone  Cellulitis of the lower extremity Patient reported redness of the bilateral extremities started today.  On physical exam patient with erythema and increased warmth present on the bilateral lower extremities. -Check CRP -Start Rocephin IV.   History of LV thrombus -Continue Xarelto  COPD, without exacerbation On physical exam patient without wheezing rhonchi appreciated. -Continue inhalers  Hyperlipidemia -Continue atorvastatin  Anxiety, depression Patient reported taking these medications still although fill dates do not match. -Continue Cogentin, Celexa, and Abilify  Cocaine abuse Patient denies any recent use.  Last UDS positive for cocaine in November -Continue to counsel need of cessation of cocaine   Tobacco abuse -Counseled on need of cessation of tobacco use  GERD -Continue Protonix  DVT prophylaxis: Continue Xarelto  Advance Care Planning:   Code Status: Full Code    Consults: None  Family Communication: None requested  Severity of Illness: The appropriate patient status for this patient is INPATIENT. Inpatient status is judged to be reasonable and necessary in order to provide the required intensity of service to ensure the patient's safety. The patient's presenting  symptoms, physical exam findings, and initial radiographic and laboratory data in the context of their chronic comorbidities is felt to place them at high risk for further clinical deterioration. Furthermore, it is not anticipated that the patient will be medically stable for discharge from the hospital within 2 midnights of admission.   * I certify that at the point of admission it is my clinical judgment that the patient will require inpatient hospital care spanning beyond 2 midnights from the point of admission due to high intensity of service, high risk for further deterioration and high  frequency of surveillance required.*  Author: Clydie Braun, MD 03/15/2022 2:24 PM  For on call review [email or web address].

## 2022-03-16 DIAGNOSIS — I5043 Acute on chronic combined systolic (congestive) and diastolic (congestive) heart failure: Secondary | ICD-10-CM | POA: Diagnosis not present

## 2022-03-16 LAB — BASIC METABOLIC PANEL
Anion gap: 10 (ref 5–15)
BUN: 23 mg/dL (ref 8–23)
CO2: 22 mmol/L (ref 22–32)
Calcium: 8.6 mg/dL — ABNORMAL LOW (ref 8.9–10.3)
Chloride: 101 mmol/L (ref 98–111)
Creatinine, Ser: 1.04 mg/dL (ref 0.61–1.24)
GFR, Estimated: >60 mL/min (ref >60)
Glucose, Bld: 84 mg/dL (ref 70–99)
Potassium: 4.3 mmol/L (ref 3.5–5.1)
Sodium: 133 mmol/L — ABNORMAL LOW (ref 135–145)

## 2022-03-16 LAB — MAGNESIUM: Magnesium: 1.7 mg/dL (ref 1.7–2.4)

## 2022-03-16 LAB — CBC
HCT: 42 % (ref 39.0–52.0)
Hemoglobin: 13.7 g/dL (ref 13.0–17.0)
MCH: 28.5 pg (ref 26.0–34.0)
MCHC: 32.6 g/dL (ref 30.0–36.0)
MCV: 87.3 fL (ref 80.0–100.0)
Platelets: 303 10*3/uL (ref 150–400)
RBC: 4.81 MIL/uL (ref 4.22–5.81)
RDW: 15.5 % (ref 11.5–15.5)
WBC: 6.7 10*3/uL (ref 4.0–10.5)
nRBC: 0 % (ref 0.0–0.2)

## 2022-03-16 NOTE — ED Notes (Signed)
Pt given nourishment bag along with cup of water

## 2022-03-16 NOTE — ED Notes (Signed)
Called to activate purple man 

## 2022-03-16 NOTE — Progress Notes (Signed)
PROGRESS NOTE    Allen Mayer  [id number] DOB: [birth year] DOA: 03/15/2022 PCP: Patient, No Pcp Per  61/M with history of hypertension, chronic HFrEF with LVEF 10-20% with LV thrombosis, RV failure,, CKD stage III, COPD, cigarette smoker, and cocaine abuse presents with complaints of cramps and swelling in legs which started 2 days ago.  He is homeless, takes his medications sometimes, does not have a place to stay, noticed swelling in both legs and redness in his right leg  In the ED, vital stable, BNP 3259, chest x-ray with cardiomegaly, no acute findings   Subjective: -Feels fair today, much better after eating something  Assessment and Plan:  Acute on chronic systolic and diastolic CHF -Known severe BiV failure  -Last echo 7/23 with EF <20%, RV with severe reduction in function -Continue IV Lasix today -Resume Jardiance, Aldactone, ARB -Monitor I's/O, daily weights, check albumin with a.m. labs -Frequent hospitalizations, complicated social issues with homelessness, poor compliance, substance abuse etc.   Cellulitis of the lower extremity -Mild erythema and warmth noted, continue IV ceftriaxone today -Check CRP and CBC in a.m   History of LV thrombus -Continue Xarelto   COPD, without exacerbation On physical exam patient without wheezing rhonchi appreciated. -Continue inhalers   Hyperlipidemia -Continue atorvastatin   Anxiety, depression Patient reported taking these medications still although fill dates do not match. -Continue Cogentin, Celexa, and Abilify   Cocaine abuse -Last UDS positive for cocaine in November -Counseled   Tobacco abuse -Counseled on need of cessation of tobacco use   GERD -Continue Protonix  Homeless -TOC consult   DVT prophylaxis: Continue Xarelto  Code Status: Full code Family Communication: None present Disposition Plan: To be determined  Consultants:    Procedures:   Antimicrobials:    Objective: Vitals:    03/16/22 0730 03/16/22 0915 03/16/22 1026 03/16/22 1115  BP: 112/77 116/76  102/82  Pulse: 80 80  69  Resp: 13 17  18   Temp:   98.1 F (36.7 C)   TempSrc:   Oral   SpO2: 100% 99%  100%  Weight:      Height:        Intake/Output Summary (Last 24 hours) at 03/16/2022 1246 Last data filed at 03/16/2022 0500 Gross per 24 hour  Intake 700 ml  Output 2500 ml  Net -1800 ml   Filed Weights   03/15/22 0737  Weight: 72.6 kg    Examination:  General exam: Chronically ill male sitting up in bed, AAOx3, no distress HEENT: Positive JVD CVS: S1-S2, regular rhythm Lungs: Decreased breath sounds at the bases Abdomen: Soft, nontender, bowel sounds present Extremities: 1-2+ edema Psychiatry:  Mood & affect appropriate.     Data Reviewed:   CBC: Recent Labs  Lab 03/15/22 0758 03/16/22 0323  WBC 5.6 6.7  NEUTROABS 3.8  --   HGB 14.7 13.7  HCT 46.7 42.0  MCV 88.4 87.3  PLT 359 [id number]   Basic Metabolic Panel: Recent Labs  Lab 03/15/22 0758 03/16/22 0323  NA 141 133*  K 4.8 4.3  CL 107 101  CO2 22 22  GLUCOSE 108* 84  BUN 26* 23  CREATININE 1.18 1.04  CALCIUM 9.6 8.6*   GFR: Estimated Creatinine Clearance: 74.7 mL/min (by C-G formula based on SCr of 1.04 mg/dL). Liver Function Tests: Recent Labs  Lab 03/15/22 0758  AST 20  ALT 19  ALKPHOS 106  BILITOT 0.8  PROT 7.1  ALBUMIN 2.9*   No results for input(s): "LIPASE", "AMYLASE" in the  last 168 hours. No results for input(s): "AMMONIA" in the last 168 hours. Coagulation Profile: No results for input(s): "INR", "PROTIME" in the last 168 hours. Cardiac Enzymes: No results for input(s): "CKTOTAL", "CKMB", "CKMBINDEX", "TROPONINI" in the last 168 hours. BNP (last 3 results) No results for input(s): "PROBNP" in the last 8760 hours. HbA1C: No results for input(s): "HGBA1C" in the last 72 hours. CBG: Recent Labs  Lab 03/15/22 0821  GLUCAP 104*   Lipid Profile: No results for input(s): "CHOL", "HDL", "LDLCALC",  "TRIG", "CHOLHDL", "LDLDIRECT" in the last 72 hours. Thyroid Function Tests: No results for input(s): "TSH", "T4TOTAL", "FREET4", "T3FREE", "THYROIDAB" in the last 72 hours. Anemia Panel: No results for input(s): "VITAMINB12", "FOLATE", "FERRITIN", "TIBC", "IRON", "RETICCTPCT" in the last 72 hours. Urine analysis:    Component Value Date/Time   COLORURINE YELLOW 01/05/2022 2155   APPEARANCEUR CLEAR 01/05/2022 2155   LABSPEC 1.016 01/05/2022 2155   PHURINE 5.0 01/05/2022 2155   GLUCOSEU NEGATIVE 01/05/2022 2155   HGBUR NEGATIVE 01/05/2022 2155   BILIRUBINUR NEGATIVE 01/05/2022 2155   KETONESUR NEGATIVE 01/05/2022 2155   PROTEINUR 100 (A) 01/05/2022 2155   UROBILINOGEN 1.0 01/31/2012 0445   NITRITE NEGATIVE 01/05/2022 2155   LEUKOCYTESUR NEGATIVE 01/05/2022 2155   Sepsis Labs: @LABRCNTIP (procalcitonin:4,lacticidven:4)  )No results found for this or any previous visit (from the past 240 hour(s)).   Radiology Studies: DG Chest 2 View  Result Date: 03/15/2022 CLINICAL DATA:  Shortness of breath with bilateral leg swelling several weeks. EXAM: CHEST - 2 VIEW COMPARISON:  02/25/2022 FINDINGS: Lungs are adequately inflated and otherwise clear. Mild to moderate cardiomegaly unchanged. Remainder of the exam is unchanged. IMPRESSION: 1. No active cardiopulmonary disease. 2. Mild to moderate cardiomegaly unchanged. Electronically Signed   By: 02/27/2022 M.D.   On: 03/15/2022 08:37     Scheduled Meds:  ARIPiprazole  10 mg Oral Daily   aspirin EC  81 mg Oral Daily   atorvastatin  80 mg Oral Daily   benztropine  1 mg Oral BID   citalopram  20 mg Oral Daily   digoxin  0.125 mg Oral Daily   empagliflozin  10 mg Oral Daily   furosemide  40 mg Intravenous BID   losartan  25 mg Oral Daily   mometasone-formoterol  2 puff Inhalation BID   nicotine  21 mg Transdermal Daily   pantoprazole  40 mg Oral Daily   rivaroxaban  20 mg Oral Q supper   sodium chloride flush  3 mL Intravenous Q12H    spironolactone  12.5 mg Oral Daily   umeclidinium bromide  1 puff Inhalation Daily   Continuous Infusions:  cefTRIAXone (ROCEPHIN)  IV Stopped (03/15/22 2114)     LOS: 1 day    Time spent: 2115  , MD [facility]   03/16/2022, 12:46 PM

## 2022-03-16 NOTE — Progress Notes (Signed)
Pt admitted to the unit from ED. Pt oriented to the unit, room and call light, skin assessment completed per protocol. VSS, telemetry applied and verified with CCMD per protocol. Telemetry called to inform pt had 7 beats run of VTach twice. Pt asymptomatic in bed. Dr. Jomarie Longs notified, will continue to closely monitor pt. PLeotis Pain Emil Klassen RN   03/16/22 1600  Vitals  Temp 97.8 F (36.6 C)  Temp Source Oral  BP (!) 120/91  MAP (mmHg) 101  BP Location Right Arm  BP Method Automatic  Patient Position (if appropriate) Lying  Pulse Rate 82  Pulse Rate Source Monitor  ECG Heart Rate 81  Resp 18  Level of Consciousness  Level of Consciousness Alert  MEWS COLOR  MEWS Score Color Green  Oxygen Therapy  SpO2 99 %  O2 Device Room Air  Pain Assessment  Pain Scale 0-10  Pain Score 0  Height and Weight  Height 6' (1.829 m)  Weight 75.1 kg  Type of Scale Used Standing  Type of Weight Actual  BSA (Calculated - sq m) 1.95 sq meters  BMI (Calculated) 22.44  Weight in (lb) to have BMI = 25 183.9  ECG Monitoring  CV Strip Heart Rate 92  Cardiac Rhythm VT;Other (Comment) (10 beat run; Database administrator aware)  MEWS Score  MEWS Temp 0  MEWS Systolic 0  MEWS Pulse 0  MEWS RR 0  MEWS LOC 0  MEWS Score 0

## 2022-03-17 DIAGNOSIS — I5043 Acute on chronic combined systolic (congestive) and diastolic (congestive) heart failure: Secondary | ICD-10-CM | POA: Diagnosis not present

## 2022-03-17 LAB — BASIC METABOLIC PANEL
Anion gap: 8 (ref 5–15)
BUN: 19 mg/dL (ref 8–23)
CO2: 29 mmol/L (ref 22–32)
Calcium: 8.8 mg/dL — ABNORMAL LOW (ref 8.9–10.3)
Chloride: 101 mmol/L (ref 98–111)
Creatinine, Ser: 1.26 mg/dL — ABNORMAL HIGH (ref 0.61–1.24)
GFR, Estimated: >60 mL/min (ref >60)
Glucose, Bld: 101 mg/dL — ABNORMAL HIGH (ref 70–99)
Potassium: 3.8 mmol/L (ref 3.5–5.1)
Sodium: 138 mmol/L (ref 135–145)

## 2022-03-17 MED ORDER — POTASSIUM CHLORIDE CRYS ER 20 MEQ PO TBCR
40.0000 meq | EXTENDED_RELEASE_TABLET | Freq: Once | ORAL | Status: AC
  Administered 2022-03-17: 40 meq via ORAL
  Filled 2022-03-17: qty 2

## 2022-03-17 NOTE — Progress Notes (Signed)
   03/17/22 1135  Mobility  Activity Ambulated with assistance in room  Level of Assistance Modified independent, requires aide device or extra time  Assistive Device None  Distance Ambulated (ft) 20 ft  Activity Response Tolerated well  Mobility Referral Yes  $Mobility charge 1 Mobility   Mobility Specialist Progress Note  Pt was in bed and agreeable. Had c/o foot pain during ambulation. Returned to bed w/ all needs met and call bell in reach.   Lucious Groves Mobility Specialist  Please contact via SecureChat or Rehab office at [phone]

## 2022-03-17 NOTE — Progress Notes (Signed)
   03/17/22 1000  Mobility  Activity Refused mobility   Mobility Specialist Progress Note  Pt refused mobility d/t " Not feeling like it". Will f/u as able.    Anastasia Pall Mobility Specialist  Please contact via SecureChat or Rehab office at [phone]

## 2022-03-17 NOTE — Progress Notes (Signed)
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  He is homeless, takes his medications sometimes, does not have a place to stay, noticed swelling in both legs and redness in his right leg  In the ED, vital stable, BNP 3259, chest x-ray with cardiomegaly, no acute findings   Subjective: -Feels better overall,, swelling is starting to improve  Assessment and Plan:  Acute on chronic systolic and diastolic CHF -Known severe BiV failure  -Last echo 7/23 with EF <20%, RV with severe reduction in function -Diuresed well yesterday, he is 5.7 L negative, continue IV Lasix today -Resumed Jardiance, Aldactone, ARB, mild bump in creatinine noted -Frequent hospitalizations, complicated social issues with homelessness, poor compliance, substance abuse etc.   Cellulitis R lower extremity -Mild erythema and warmth noted, continue IV ceftriaxone 1 more day   History of LV thrombus -Continue Xarelto   COPD, without exacerbation On physical exam patient without wheezing rhonchi appreciated. -Continue inhalers   Hyperlipidemia -Continue atorvastatin   Anxiety, depression Patient reported taking these medications still although fill dates do not match. -Continue Cogentin, Celexa, and Abilify   Cocaine abuse -Last UDS positive for cocaine in November -Counseled   Tobacco abuse -Counseled on need of cessation of tobacco use   GERD -Continue Protonix  Homeless -TOC consult   DVT prophylaxis: Continue Xarelto  Code Status: Full code Family Communication: None present Disposition Plan: To be determined  Consultants:    Procedures:   Antimicrobials:    Objective: Vitals:   03/16/22  1959 03/17/22 0002 03/17/22 0322 03/17/22 0713  BP: 105/69 108/85 113/88 107/68  Pulse: 80 80 75 84  Resp: 17 18 17 18   Temp: 98.1 F (36.7 C) 98.8 F (37.1 C) 97.9 F (36.6 C) 97.9 F (36.6 C)  TempSrc: Oral Oral Oral Oral  SpO2: 94% 97% 100% 96%  Weight:  74.2 kg    Height:        Intake/Output Summary (Last 24 hours) at 03/17/2022 1208 Last data filed at 03/17/2022 1056 Gross per 24 hour  Intake 1194 ml  Output 6200 ml  Net -5006 ml   Filed Weights   03/15/22 0737 03/16/22 1600 03/17/22 0002  Weight: 72.6 kg 75.1 kg 74.2 kg    Examination:  General exam: Chronically ill male sitting up in bed, AAOx3, no distress HEENT: Positive JVD CVS: S1-S2, regular rhythm Lungs: Decreased breath sounds at the bases Abdomen: Soft, nontender, bowel sounds present Extremities: Trace edema, mild right leg erythema Psychiatry:  Mood & affect appropriate.     Data Reviewed:   CBC: Recent Labs  Lab 03/15/22 0758 03/16/22 0323  WBC 5.6 6.7  NEUTROABS 3.8  --   HGB 14.7 13.7  HCT 46.7 42.0  MCV 88.4 87.3  PLT 359 303   Basic Metabolic Panel: Recent Labs  Lab 03/15/22 0758 03/16/22 0323 03/17/22 0114  NA 141 133* 138  K 4.8 4.3 3.8  CL 107 101 101  CO2 22 22 29   GLUCOSE 108* 84 101*  BUN 26* 23 19  CREATININE 1.18 1.04 1.26*  CALCIUM 9.6 8.6* 8.8*  MG  --  1.7  --    GFR: Estimated Creatinine Clearance: 63 mL/min (A) (by C-G formula based on  SCr of 1.26 mg/dL (H)). Liver Function Tests: Recent Labs  Lab 03/15/22 0758  AST 20  ALT 19  ALKPHOS 106  BILITOT 0.8  PROT 7.1  ALBUMIN 2.9*   No results for input(s): "LIPASE", "AMYLASE" in the last 168 hours. No results for input(s): "AMMONIA" in the last 168 hours. Coagulation Profile: No results for input(s): "INR", "PROTIME" in the last 168 hours. Cardiac Enzymes: No results for input(s): "CKTOTAL", "CKMB", "CKMBINDEX", "TROPONINI" in the last 168 hours. BNP (last 3 results) No results for input(s):  "PROBNP" in the last 8760 hours. HbA1C: No results for input(s): "HGBA1C" in the last 72 hours. CBG: Recent Labs  Lab 03/15/22 0821  GLUCAP 104*   Lipid Profile: No results for input(s): "CHOL", "HDL", "LDLCALC", "TRIG", "CHOLHDL", "LDLDIRECT" in the last 72 hours. Thyroid Function Tests: No results for input(s): "TSH", "T4TOTAL", "FREET4", "T3FREE", "THYROIDAB" in the last 72 hours. Anemia Panel: No results for input(s): "VITAMINB12", "FOLATE", "FERRITIN", "TIBC", "IRON", "RETICCTPCT" in the last 72 hours. Urine analysis:    Component Value Date/Time   COLORURINE YELLOW 01/05/2022 2155   APPEARANCEUR CLEAR 01/05/2022 2155   LABSPEC 1.016 01/05/2022 2155   PHURINE 5.0 01/05/2022 2155   GLUCOSEU NEGATIVE 01/05/2022 2155   HGBUR NEGATIVE 01/05/2022 2155   BILIRUBINUR NEGATIVE 01/05/2022 2155   KETONESUR NEGATIVE 01/05/2022 2155   PROTEINUR 100 (A) 01/05/2022 2155   UROBILINOGEN 1.0 01/31/2012 0445   NITRITE NEGATIVE 01/05/2022 2155   LEUKOCYTESUR NEGATIVE 01/05/2022 2155   Sepsis Labs: @LABRCNTIP (procalcitonin:4,lacticidven:4)  )No results found for this or any previous visit (from the past 240 hour(s)).   Radiology Studies: No results found.   Scheduled Meds:  ARIPiprazole  10 mg Oral Daily   aspirin EC  81 mg Oral Daily   atorvastatin  80 mg Oral Daily   benztropine  1 mg Oral BID   citalopram  20 mg Oral Daily   digoxin  0.125 mg Oral Daily   empagliflozin  10 mg Oral Daily   furosemide  40 mg Intravenous BID   losartan  25 mg Oral Daily   mometasone-formoterol  2 puff Inhalation BID   nicotine  21 mg Transdermal Daily   pantoprazole  40 mg Oral Daily   rivaroxaban  20 mg Oral Q supper   sodium chloride flush  3 mL Intravenous Q12H   spironolactone  12.5 mg Oral Daily   umeclidinium bromide  1 puff Inhalation Daily   Continuous Infusions:  cefTRIAXone (ROCEPHIN)  IV Stopped (03/16/22 2140)     LOS: 2 days    Time spent: 14/06/23  ,  MD [facility]   03/17/2022, 12:08 PM

## 2022-03-18 ENCOUNTER — Other Ambulatory Visit (HOSPITAL_COMMUNITY): Payer: Self-pay

## 2022-03-18 DIAGNOSIS — I5043 Acute on chronic combined systolic (congestive) and diastolic (congestive) heart failure: Secondary | ICD-10-CM | POA: Diagnosis not present

## 2022-03-18 LAB — COMPREHENSIVE METABOLIC PANEL
ALT: 31 U/L (ref 0–44)
AST: 43 U/L — ABNORMAL HIGH (ref 15–41)
Albumin: 2.8 g/dL — ABNORMAL LOW (ref 3.5–5.0)
Alkaline Phosphatase: 99 U/L (ref 38–126)
Anion gap: 12 (ref 5–15)
BUN: 19 mg/dL (ref 8–23)
CO2: 26 mmol/L (ref 22–32)
Calcium: 9.2 mg/dL (ref 8.9–10.3)
Chloride: 98 mmol/L (ref 98–111)
Creatinine, Ser: 1.1 mg/dL (ref 0.61–1.24)
GFR, Estimated: >60 mL/min (ref >60)
Glucose, Bld: 102 mg/dL — ABNORMAL HIGH (ref 70–99)
Potassium: 4.4 mmol/L (ref 3.5–5.1)
Sodium: 136 mmol/L (ref 135–145)
Total Bilirubin: 0.7 mg/dL (ref 0.3–1.2)
Total Protein: 6.8 g/dL (ref 6.5–8.1)

## 2022-03-18 MED ORDER — CEPHALEXIN 250 MG PO CAPS
250.0000 mg | ORAL_CAPSULE | Freq: Four times a day (QID) | ORAL | 0 refills | Status: DC
  Filled 2022-03-18: qty 8, 2d supply, fill #0

## 2022-03-18 MED ORDER — TORSEMIDE 20 MG PO TABS: 20.0000 mg | ORAL_TABLET | Freq: Every day | ORAL | Status: DC

## 2022-03-18 MED ORDER — GABAPENTIN 400 MG PO CAPS
400.0000 mg | ORAL_CAPSULE | Freq: Every day | ORAL | 0 refills | Status: DC | PRN
  Filled 2022-03-18: qty 30, 30d supply, fill #0

## 2022-03-18 MED ORDER — ASPIRIN 81 MG PO TBEC
81.0000 mg | DELAYED_RELEASE_TABLET | Freq: Every day | ORAL | 0 refills | Status: DC
  Filled 2022-03-18: qty 30, 30d supply, fill #0

## 2022-03-18 MED ORDER — BENZTROPINE MESYLATE 1 MG PO TABS
1.0000 mg | ORAL_TABLET | Freq: Two times a day (BID) | ORAL | 0 refills | Status: DC
  Filled 2022-03-18: qty 60, 30d supply, fill #0

## 2022-03-18 MED ORDER — TORSEMIDE 20 MG PO TABS
20.0000 mg | ORAL_TABLET | Freq: Every day | ORAL | Status: DC
  Administered 2022-03-18 – 2022-03-21 (×4): 20 mg via ORAL
  Filled 2022-03-18 (×4): qty 1

## 2022-03-18 MED ORDER — CEPHALEXIN 250 MG PO CAPS
250.0000 mg | ORAL_CAPSULE | Freq: Four times a day (QID) | ORAL | Status: DC
  Administered 2022-03-18 – 2022-03-19 (×4): 250 mg via ORAL
  Filled 2022-03-18 (×4): qty 1

## 2022-03-18 MED ORDER — ARIPIPRAZOLE 10 MG PO TABS
10.0000 mg | ORAL_TABLET | Freq: Every day | ORAL | 0 refills | Status: DC
  Filled 2022-03-18: qty 30, 30d supply, fill #0

## 2022-03-18 NOTE — TOC Transition Note (Signed)
Discharge medications (5) are being stored in the main pharmacy on the ground floor until patient is ready for discharge.   

## 2022-03-18 NOTE — Progress Notes (Signed)
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  He is homeless, takes his medications sometimes, does not have a place to stay, noticed swelling in both legs and redness in his right leg  In the ED, vital stable, BNP 3259, chest x-ray with cardiomegaly, no acute findings   Subjective: -Feels better overall, breathing is improving, swelling is almost gone  Assessment and Plan:  Acute on chronic systolic and diastolic CHF -Known severe BiV failure  -Last echo 7/23 with EF <20%, RV with severe reduction in function -Diuresed well, he is 7.5 L negative, switch to oral torsemide -Resumed Jardiance, Aldactone, ARB,  -Frequent hospitalizations, complicated social issues with homelessness, poor compliance, substance abuse etc. -High risk of quick readmission   Cellulitis R lower extremity -Improving, switch to oral Keflex for 3 days   History of LV thrombus -Continue Xarelto   COPD, without exacerbation -Stable -Continue inhalers   Hyperlipidemia -Continue atorvastatin   Anxiety, depression -Continue Cogentin, Celexa, and Abilify   Cocaine abuse -Last UDS positive for cocaine in November -Counseled   Tobacco abuse -Counseled on need of cessation of tobacco use   GERD -Continue Protonix  Homeless -TOC consult   DVT prophylaxis: Continue Xarelto  Code Status: Full code Family Communication: None present Disposition Plan: To be determined  Consultants:    Procedures:   Antimicrobials:    Objective: Vitals:   03/17/22 1959 03/17/22 2009 03/18/22 0334 03/18/22 1129  BP: 95/73 100/72 102/73 (!) 110/95  Pulse: 80 76 79 67  Resp: 19  19 19   Temp: (!) 97.5 F (36.4 C)   97.6 F (36.4 C) (!) 97.5 F (36.4 C)  TempSrc: Oral  Oral Oral  SpO2: 94%  100% 99%  Weight:   71.9 kg   Height:        Intake/Output Summary (Last 24 hours) at 03/18/2022 1228 Last data filed at 03/18/2022 1129 Gross per 24 hour  Intake 994 ml  Output 2700 ml  Net -1706 ml   Filed Weights   03/16/22 1600 03/17/22 0002 03/18/22 0334  Weight: 75.1 kg 74.2 kg 71.9 kg    Examination:  General exam: Chronically ill male sitting up in bed, AAOx3, no distress HEENT: no JVD CVS: S1-S2, regular rhythm Lungs: Decreased breath sounds at the bases otherwise clear Abdomen: Soft, nontender, bowel sounds present Extremities: No edema, minimal right leg erythema Psychiatry:  Mood & affect appropriate.     Data Reviewed:   CBC: Recent Labs  Lab 03/15/22 0758 03/16/22 0323  WBC 5.6 6.7  NEUTROABS 3.8  --   HGB 14.7 13.7  HCT 46.7 42.0  MCV 88.4 87.3  PLT 359 303   Basic Metabolic Panel: Recent Labs  Lab 03/15/22 0758 03/16/22 0323 03/17/22 0114 03/18/22 0029  NA 141 133* 138 136  K 4.8 4.3 3.8 4.4  CL 107 101 101 98  CO2 22 22 29 26   GLUCOSE 108* 84 101* 102*  BUN 26* 23 19 19   CREATININE 1.18 1.04 1.26* 1.10  CALCIUM 9.6 8.6* 8.8* 9.2  MG  --  1.7  --   --    GFR: Estimated Creatinine Clearance: 69.9 mL/min (by C-G formula based on SCr of 1.1 mg/dL). Liver Function  Tests: Recent Labs  Lab 03/15/22 0758 03/18/22 0029  AST 20 43*  ALT 19 31  ALKPHOS 106 99  BILITOT 0.8 0.7  PROT 7.1 6.8  ALBUMIN 2.9* 2.8*   No results for input(s): "LIPASE", "AMYLASE" in the last 168 hours. No results for input(s): "AMMONIA" in the last 168 hours. Coagulation Profile: No results for input(s): "INR", "PROTIME" in the last 168 hours. Cardiac Enzymes: No results for input(s): "CKTOTAL", "CKMB", "CKMBINDEX", "TROPONINI" in the last 168 hours. BNP (last 3 results) No results for input(s): "PROBNP" in the last 8760 hours. HbA1C: No results for input(s): "HGBA1C" in the  last 72 hours. CBG: Recent Labs  Lab 03/15/22 0821  GLUCAP 104*   Lipid Profile: No results for input(s): "CHOL", "HDL", "LDLCALC", "TRIG", "CHOLHDL", "LDLDIRECT" in the last 72 hours. Thyroid Function Tests: No results for input(s): "TSH", "T4TOTAL", "FREET4", "T3FREE", "THYROIDAB" in the last 72 hours. Anemia Panel: No results for input(s): "VITAMINB12", "FOLATE", "FERRITIN", "TIBC", "IRON", "RETICCTPCT" in the last 72 hours. Urine analysis:    Component Value Date/Time   COLORURINE YELLOW 01/05/2022 2155   APPEARANCEUR CLEAR 01/05/2022 2155   LABSPEC 1.016 01/05/2022 2155   PHURINE 5.0 01/05/2022 2155   GLUCOSEU NEGATIVE 01/05/2022 2155   HGBUR NEGATIVE 01/05/2022 2155   BILIRUBINUR NEGATIVE 01/05/2022 2155   KETONESUR NEGATIVE 01/05/2022 2155   PROTEINUR 100 (A) 01/05/2022 2155   UROBILINOGEN 1.0 01/31/2012 0445   NITRITE NEGATIVE 01/05/2022 2155   LEUKOCYTESUR NEGATIVE 01/05/2022 2155   Sepsis Labs: @LABRCNTIP (procalcitonin:4,lacticidven:4)  )No results found for this or any previous visit (from the past 240 hour(s)).   Radiology Studies: No results found.   Scheduled Meds:  ARIPiprazole  10 mg Oral Daily   aspirin EC  81 mg Oral Daily   atorvastatin  80 mg Oral Daily   benztropine  1 mg Oral BID   cephALEXin  250 mg Oral Q6H   citalopram  20 mg Oral Daily   digoxin  0.125 mg Oral Daily   empagliflozin  10 mg Oral Daily   losartan  25 mg Oral Daily   mometasone-formoterol  2 puff Inhalation BID   nicotine  21 mg Transdermal Daily   pantoprazole  40 mg Oral Daily   rivaroxaban  20 mg Oral Q supper   sodium chloride flush  3 mL Intravenous Q12H   spironolactone  12.5 mg Oral Daily   torsemide  20 mg Oral Daily   umeclidinium bromide  1 puff Inhalation Daily   Continuous Infusions:     LOS: 3 days    Time spent:  , MD [facility]   03/18/2022, 12:28 PM

## 2022-03-18 NOTE — Progress Notes (Addendum)
[facility] to call [facility]- per conversation pt is established with the [facility] clinic and they have been working with him for housing needs. Per Misty Stanley they have secured him placement, however this place can not do intake until Monday 12/11- Pt will also have someone that can transport him on Monday, she will just need to be contacted Monday with regards to time that pt will be ready.  Lisa's contact at the [place]- [phone] Melinda Crutch (transport person) contact- [phone] cell.

## 2022-03-19 DIAGNOSIS — I5043 Acute on chronic combined systolic (congestive) and diastolic (congestive) heart failure: Secondary | ICD-10-CM | POA: Diagnosis not present

## 2022-03-19 LAB — BASIC METABOLIC PANEL
Anion gap: 14 (ref 5–15)
BUN: 23 mg/dL (ref 8–23)
CO2: 28 mmol/L (ref 22–32)
Calcium: 9.4 mg/dL (ref 8.9–10.3)
Chloride: 94 mmol/L — ABNORMAL LOW (ref 98–111)
Creatinine, Ser: 1.23 mg/dL (ref 0.61–1.24)
GFR, Estimated: >60 mL/min (ref >60)
Glucose, Bld: 141 mg/dL — ABNORMAL HIGH (ref 70–99)
Potassium: 4.4 mmol/L (ref 3.5–5.1)
Sodium: 136 mmol/L (ref 135–145)

## 2022-03-19 MED ORDER — CEPHALEXIN 250 MG PO CAPS
250.0000 mg | ORAL_CAPSULE | Freq: Four times a day (QID) | ORAL | Status: AC
  Administered 2022-03-19 – 2022-03-20 (×3): 250 mg via ORAL
  Filled 2022-03-19 (×4): qty 1

## 2022-03-19 NOTE — Progress Notes (Signed)
PROGRESS NOTE    Allen Mayer  ZHG:[id number] DOB: Jan 19, 1961 DOA: 03/15/2022 PCP: Patient, No Pcp Per  61/M with history of hypertension, chronic HFrEF with LVEF 10-20% with LV thrombosis, RV failure,, CKD stage III, COPD, cigarette smoker, and cocaine abuse presents with complaints of cramps and swelling in legs which started 2 days ago.  He is homeless, takes his medications sometimes, does not have a place to stay, noticed swelling in both legs and redness in his right leg  In the ED, vital stable, BNP 3259, chest x-ray with cardiomegaly, no acute findings   Subjective: -Feels better overall, breathing is improving, swelling is almost gone  Assessment and Plan:  Acute on chronic systolic and diastolic CHF -Known severe BiV failure  -Last echo 7/23 with EF <20%, RV with severe reduction in function -Diuresed well, he is 9.9 L negative,  -Resumed Jardiance, Aldactone, ARB,  -Now euvolemic, oral torsemide resumed -Frequent hospitalizations, complicated social issues with homelessness, poor compliance, substance abuse etc. -High risk of quick readmission, per case management [facility] with [facility]   Cellulitis R lower extremity -Improving, continue oral Keflex for 1 more day   History of LV thrombus -Continue Xarelto   COPD, without exacerbation -Stable -Continue inhalers   Hyperlipidemia -Continue atorvastatin   Anxiety, depression -Continue Cogentin, Celexa, and Abilify   Cocaine abuse -Last UDS positive for cocaine in November -Counseled   Tobacco abuse -Counseled on need of cessation of tobacco use   GERD -Continue Protonix  Homeless -TOC consult   DVT prophylaxis: Continue Xarelto  Code Status: Full code Family Communication: None present Disposition Plan: Homeless, will [facility]  Consultants:    Procedures:   Antimicrobials:    Objective: Vitals:   03/19/22  0414 03/19/22 0734 03/19/22 0830 03/19/22 0846  BP: 108/70  115/80   Pulse: 79  71 78  Resp: 19  20   Temp: (!) 97.5 F (36.4 C)  (!) 97.4 F (36.3 C)   TempSrc: Oral  Oral   SpO2: 95% 92% 95%   Weight: 69.6 kg     Height:        Intake/Output Summary (Last 24 hours) at 03/19/2022 1117 Last data filed at 03/19/2022 0831 Gross per 24 hour  Intake 1020 ml  Output 2975 ml  Net -1955 ml   Filed Weights   03/17/22 0002 03/18/22 0334 03/19/22 0414  Weight: 74.2 kg 71.9 kg 69.6 kg    Examination:  General exam: Pleasant chronically ill male sitting up in bed, AAOx3, no distress HEENT: No JVD CVS: S1-S2, regular rhythm Lungs: Decreased breath sounds at the bases Abdomen: Soft, nontender, bowel sounds present Extremities: No further edema, minimal right leg erythema  Data Reviewed:   CBC: Recent Labs  Lab 03/15/22 0758 03/16/22 0323  WBC 5.6 6.7  NEUTROABS 3.8  --   HGB 14.7 13.7  HCT 46.7 42.0  MCV 88.4 87.3  PLT 359 303   Basic Metabolic Panel: Recent Labs  Lab 03/15/22 0758 03/16/22 0323 03/17/22 0114 03/18/22 0029 03/19/22 0026  NA 141 133* 138 136 136  K 4.8 4.3 3.8 4.4 4.4  CL 107 101 101 98 94*  CO2 22 22 29 26 28   GLUCOSE 108* 84 101* 102* 141*  BUN 26* 23 19 19 23   CREATININE 1.18 1.04 1.26* 1.10 1.23  CALCIUM 9.6 8.6* 8.8* 9.2 9.4  MG  --  1.7  --   --   --  GFR: Estimated Creatinine Clearance: 60.5 mL/min (by C-G formula based on SCr of 1.23 mg/dL). Liver Function Tests: Recent Labs  Lab 03/15/22 0758 03/18/22 0029  AST 20 43*  ALT 19 31  ALKPHOS 106 99  BILITOT 0.8 0.7  PROT 7.1 6.8  ALBUMIN 2.9* 2.8*   No results for input(s): "LIPASE", "AMYLASE" in the last 168 hours. No results for input(s): "AMMONIA" in the last 168 hours. Coagulation Profile: No results for input(s): "INR", "PROTIME" in the last 168 hours. Cardiac Enzymes: No results for input(s): "CKTOTAL", "CKMB", "CKMBINDEX", "TROPONINI" in the last 168 hours. BNP (last  3 results) No results for input(s): "PROBNP" in the last 8760 hours. HbA1C: No results for input(s): "HGBA1C" in the last 72 hours. CBG: Recent Labs  Lab 03/15/22 0821  GLUCAP 104*   Lipid Profile: No results for input(s): "CHOL", "HDL", "LDLCALC", "TRIG", "CHOLHDL", "LDLDIRECT" in the last 72 hours. Thyroid Function Tests: No results for input(s): "TSH", "T4TOTAL", "FREET4", "T3FREE", "THYROIDAB" in the last 72 hours. Anemia Panel: No results for input(s): "VITAMINB12", "FOLATE", "FERRITIN", "TIBC", "IRON", "RETICCTPCT" in the last 72 hours. Urine analysis:    Component Value Date/Time   COLORURINE YELLOW 01/05/2022 2155   APPEARANCEUR CLEAR 01/05/2022 2155   LABSPEC 1.016 01/05/2022 2155   PHURINE 5.0 01/05/2022 2155   GLUCOSEU NEGATIVE 01/05/2022 2155   HGBUR NEGATIVE 01/05/2022 2155   BILIRUBINUR NEGATIVE 01/05/2022 2155   KETONESUR NEGATIVE 01/05/2022 2155   PROTEINUR 100 (A) 01/05/2022 2155   UROBILINOGEN 1.0 01/31/2012 0445   NITRITE NEGATIVE 01/05/2022 2155   LEUKOCYTESUR NEGATIVE 01/05/2022 2155   Sepsis Labs: @LABRCNTIP (procalcitonin:4,lacticidven:4)  )No results found for this or any previous visit (from the past 240 hour(s)).   Radiology Studies: No results found.   Scheduled Meds:  ARIPiprazole  10 mg Oral Daily   aspirin EC  81 mg Oral Daily   atorvastatin  80 mg Oral Daily   benztropine  1 mg Oral BID   cephALEXin  250 mg Oral Q6H   citalopram  20 mg Oral Daily   digoxin  0.125 mg Oral Daily   empagliflozin  10 mg Oral Daily   losartan  25 mg Oral Daily   mometasone-formoterol  2 puff Inhalation BID   nicotine  21 mg Transdermal Daily   pantoprazole  40 mg Oral Daily   rivaroxaban  20 mg Oral Q supper   sodium chloride flush  3 mL Intravenous Q12H   spironolactone  12.5 mg Oral Daily   torsemide  20 mg Oral Daily   umeclidinium bromide  1 puff Inhalation Daily   Continuous Infusions:     LOS: 4 days    Time spent:  , MD [facility]   03/19/2022, 11:17 AM

## 2022-03-19 NOTE — Progress Notes (Signed)
Patient took his telemetry off and is refusing to put it back on. States he wants to be left alone. Physician notified.

## 2022-03-20 LAB — BASIC METABOLIC PANEL
Anion gap: 11 (ref 5–15)
BUN: 30 mg/dL — ABNORMAL HIGH (ref 8–23)
CO2: 29 mmol/L (ref 22–32)
Calcium: 9.3 mg/dL (ref 8.9–10.3)
Chloride: 97 mmol/L — ABNORMAL LOW (ref 98–111)
Creatinine, Ser: 1.28 mg/dL — ABNORMAL HIGH (ref 0.61–1.24)
GFR, Estimated: >60 mL/min (ref >60)
Glucose, Bld: 100 mg/dL — ABNORMAL HIGH (ref 70–99)
Potassium: 4.6 mmol/L (ref 3.5–5.1)
Sodium: 137 mmol/L (ref 135–145)

## 2022-03-20 NOTE — Progress Notes (Signed)
   03/20/22 1400  Mobility  Activity Refused mobility   Mobility Specialist Progress Note  Pt refused mobility d/t "Being good". Will f/u as able.    Anastasia Pall Mobility Specialist  Please contact via SecureChat or Rehab office at [phone]

## 2022-03-20 NOTE — Progress Notes (Signed)
Patient seen and examined, no changes from my note yesterday, 61/M with severe biventricular failure, homelessness, he will discharged to a new placement/dispo [facility]  Zannie Cove, MD

## 2022-03-21 ENCOUNTER — Other Ambulatory Visit (HOSPITAL_COMMUNITY): Payer: Self-pay

## 2022-03-21 DIAGNOSIS — I5043 Acute on chronic combined systolic (congestive) and diastolic (congestive) heart failure: Secondary | ICD-10-CM | POA: Diagnosis not present

## 2022-03-21 NOTE — Progress Notes (Deleted)
SATURATION QUALIFICATIONS: (This note is used to comply with regulatory documentation for home oxygen)  Patient Saturations on Room Air at Rest = 95%  Patient Saturations on Room Air while Ambulating = 92%   

## 2022-03-21 NOTE — Plan of Care (Signed)

## 2022-03-21 NOTE — Discharge Summary (Signed)
Physician Discharge Summary  Denney Shein WUJ:[id number] DOB: May 11, 1960 DOA: 03/15/2022  PCP: Patient, No Pcp Per  Admit date: 03/15/2022 Discharge date: 03/21/2022  Time spent: 35 minutes  Recommendations for Outpatient Follow-up:  Heart failure clinic in 2 to 3 weeks High risk of quick readmission   Discharge Diagnoses:  Principal Problem:   Acute on chronic combined systolic and diastolic CHF (congestive heart failure) (HCC) Severe BiV failure Noncompliance Homelessness   Cellulitis   History of thrombosis   COPD (chronic obstructive pulmonary disease) (HCC)   Hyperlipidemia   Anxiety and depression   Cocaine abuse (HCC)   Tobacco abuse   Discharge Condition: Stable  Diet recommendation: Low-sodium, heart healthy  Filed Weights   03/19/22 0414 03/20/22 0456 03/21/22 0452  Weight: 69.6 kg 70 kg 71 kg    History of present illness:  61/M with history of hypertension, chronic HFrEF with LVEF 10-20% with LV thrombosis, RV failure,, CKD stage III, COPD, cigarette smoker, and cocaine abuse presents with complaints of cramps and swelling in legs which started 2 days ago.  He is homeless, takes his medications sometimes, does not have a place to stay, noticed swelling in both legs and redness in his right leg  In the ED, vital stable, BNP 3259, chest x-ray with cardiomegaly, no acute findings  Hospital Course:   Acute on chronic systolic and diastolic CHF -Known severe BiV failure  -Last echo 7/23 with EF <20%, RV with severe reduction in function -Diuresed well, he is 9.9 L negative,  -Resumed Jardiance, Aldactone, ARB,  -Now euvolemic, oral torsemide resumed -Frequent hospitalizations, complicated social issues with homelessness, poor compliance, substance abuse, no-shows for TOC follow-up -High risk of quick readmission, per case management [facility] Mr. Otterson and he will be discharged to his new residence today   Cellulitis R lower  extremity -Completed course of Keflex   History of LV thrombus -Continue Xarelto   COPD, without exacerbation -Stable -Continue inhalers   Hyperlipidemia -Continue atorvastatin   Anxiety, depression -Continue Cogentin, Celexa, and Abilify   Cocaine abuse -Last UDS positive for cocaine in November -Counseled   Tobacco abuse -Counseled on need of cessation of tobacco use   GERD -Continue Protonix   Homelessness -[facility] him and he will be discharged to his new residence on Monday  Discharge Exam: Vitals:   03/21/22 0728 03/21/22 0948  BP:  112/83  Pulse:  80  Resp:  19  Temp:    SpO2: 98%    Gen: Awake, Alert, Oriented X 3,  HEENT: no JVD Lungs: Good air movement bilaterally, CTAB CVS: S1S2/RRR Abd: soft, Non tender, non distended, BS present Extremities: No edema Skin: no new rashes on exposed skin   Discharge Instructions   Discharge Instructions     Diet - low sodium heart healthy   Complete by: As directed    Increase activity slowly   Complete by: As directed       Allergies as of 03/21/2022       Reactions   Flexeril [cyclobenzaprine] Other (See Comments)   Caused cramping and patient did not like the way it made it feel. Requests this medication to NOT be given to him.        Medication List     TAKE these medications    ARIPiprazole 10 MG tablet Commonly known as: ABILIFY Take 1 tablet (10 mg total) by mouth daily.   Aspirin Low Dose 81 MG tablet Generic  drug: aspirin EC Take 1 tablet (81 mg total) by mouth daily. Swallow whole.   atorvastatin 80 MG tablet Commonly known as: LIPITOR Take 1 tablet (80 mg total) by mouth daily.   benzonatate 100 MG capsule Commonly known as: TESSALON Take 1 capsule (100 mg total) by mouth 3 (three) times daily.   benztropine 1 MG tablet Commonly known as: COGENTIN Take 1 tablet (1 mg total) by mouth 2 (two) times daily.   citalopram 20 MG tablet Commonly known  as: CELEXA Take 1 tablet (20 mg total) by mouth daily.   digoxin 0.125 MG tablet Commonly known as: LANOXIN Take 1 tablet (0.125 mg total) by mouth daily.   Dulera 200-5 MCG/ACT Aero Generic drug: mometasone-formoterol Inhale 2 puffs into the lungs 2 (two) times daily.   gabapentin 400 MG capsule Commonly known as: NEURONTIN Take 1 capsule (400 mg total) by mouth daily as needed (nerve pain).   Incruse Ellipta 62.5 MCG/ACT Aepb Generic drug: umeclidinium bromide Inhale 1 puff into the lungs daily.   Jardiance 10 MG Tabs tablet Generic drug: empagliflozin Take 1 tablet (10 mg total) by mouth daily.   losartan 25 MG tablet Commonly known as: COZAAR Take 1 tablet (25 mg total) by mouth daily.   Mucus Relief DM 30-600 MG Tb12 Take 1 tablet by mouth 2 (two) times daily. What changed:  when to take this reasons to take this   nitroGLYCERIN 0.4 MG SL tablet Commonly known as: NITROSTAT Place 1 tablet (0.4 mg total) under the tongue every 5 (five) minutes as needed for chest pain.   pantoprazole 40 MG tablet Commonly known as: PROTONIX Take 1 tablet (40 mg total) by mouth daily.   spironolactone 25 MG tablet Commonly known as: ALDACTONE Take 1/2 tablets (12.5 mg total) by mouth daily.   torsemide 20 MG tablet Commonly known as: DEMADEX Take 1 tablet (20 mg total) by mouth daily.   Ventolin HFA 108 (90 Base) MCG/ACT inhaler Generic drug: albuterol Inhale 1-2 puffs into the lungs every 6 (six) hours as needed for wheezing or shortness of breath.   Xarelto 20 MG Tabs tablet Generic drug: rivaroxaban Take 1 tablet (20 mg total) by mouth daily with supper.       Allergies  Allergen Reactions   Flexeril [Cyclobenzaprine] Other (See Comments)    Caused cramping and patient did not like the way it made it feel. Requests this medication to NOT be given to him.    Follow-up Information     [facility]   Why: Please follow up in a week. Contact  information: [address] [facility] [place] [address] [phone]                  The results of significant diagnostics from this hospitalization (including imaging, microbiology, ancillary and laboratory) are listed below for reference.    Significant Diagnostic Studies: DG Chest 2 View  Result Date: 03/15/2022 CLINICAL DATA:  Shortness of breath with bilateral leg swelling several weeks. EXAM: CHEST - 2 VIEW COMPARISON:  02/25/2022 FINDINGS: Lungs are adequately inflated and otherwise clear. Mild to moderate cardiomegaly unchanged. Remainder of the exam is unchanged. IMPRESSION: 1. No active cardiopulmonary disease. 2. Mild to moderate cardiomegaly unchanged. Electronically Signed   By: Elberta Fortis M.D.   On: 03/15/2022 08:37   CT HEAD WO CONTRAST ( )  Result Date: 03/12/2022 CLINICAL DATA:  Delirium EXAM: CT HEAD WITHOUT CONTRAST TECHNIQUE: Contiguous axial images were obtained from the base of  the skull through the vertex without intravenous contrast. RADIATION DOSE REDUCTION: This exam was performed according to the departmental dose-optimization program which includes automated exposure control, adjustment of the mA and/or kV according to patient size and/or use of iterative reconstruction technique. COMPARISON:  None Available. FINDINGS: Brain: There is no mass, hemorrhage or extra-axial collection. The size and configuration of the ventricles and extra-axial CSF spaces are normal. The brain parenchyma is normal, without acute or chronic infarction. Vascular: No abnormal hyperdensity of the major intracranial arteries or dural venous sinuses. No intracranial atherosclerosis. Skull: The visualized skull base, calvarium and extracranial soft tissues are normal. Sinuses/Orbits: No fluid levels or advanced mucosal thickening of the visualized paranasal sinuses. No mastoid or middle ear effusion. The orbits are normal. IMPRESSION: Normal head CT. Electronically Signed    By: Deatra Robinson M.D.   On: 03/12/2022 00:13   DG Chest 1 View  Result Date: 02/25/2022 CLINICAL DATA:  CHF EXAM: CHEST  1 VIEW COMPARISON:  Yesterday FINDINGS: Unchanged cardiopericardial enlargement with generalized interstitial coarsening and cephalized flow. No visible effusion or pneumothorax. No focal pneumonia. IMPRESSION: Unchanged cardiomegaly and vascular congestion. Electronically Signed   By: Tiburcio Pea M.D.   On: 02/25/2022 07:23   DG Chest 2 View  Result Date: 02/24/2022 CLINICAL DATA:  Shortness of breath EXAM: CHEST - 2 VIEW COMPARISON:  CXR 01/18/22 FINDINGS: No pleural effusion. No pneumothorax. Cardiomegaly. Unchanged mediastinal contours. No displaced rib fractures. No focal airspace opacity. Mildly prominent interstitial opacities suggestive of pulmonary venous congestion. No overt pulmonary edema. Visualized upper abdomen unremarkable. Vertebral body heights are maintained. IMPRESSION: Cardiomegaly with pulmonary venous congestion. No overt pulmonary edema. Electronically Signed   By: Lorenza Cambridge M.D.   On: 02/24/2022 12:55    Microbiology: No results found for this or any previous visit (from the past 240 hour(s)).   Labs: Basic Metabolic Panel: Recent Labs  Lab 03/16/22 0323 03/17/22 0114 03/18/22 0029 03/19/22 0026 03/20/22 0028  NA 133* 138 136 136 137  K 4.3 3.8 4.4 4.4 4.6  CL 101 101 98 94* 97*  CO2 22 29 26 28 29   GLUCOSE 84 101* 102* 141* 100*  BUN 23 19 19 23  30*  CREATININE 1.04 1.26* 1.10 1.23 1.28*  CALCIUM 8.6* 8.8* 9.2 9.4 9.3  MG 1.7  --   --   --   --    Liver Function Tests: Recent Labs  Lab 03/15/22 0758 03/18/22 0029  AST 20 43*  ALT 19 31  ALKPHOS 106 99  BILITOT 0.8 0.7  PROT 7.1 6.8  ALBUMIN 2.9* 2.8*   No results for input(s): "LIPASE", "AMYLASE" in the last 168 hours. No results for input(s): "AMMONIA" in the last 168 hours. CBC: Recent Labs  Lab 03/15/22 0758 03/16/22 0323  WBC 5.6 6.7  NEUTROABS 3.8  --    HGB 14.7 13.7  HCT 46.7 42.0  MCV 88.4 87.3  PLT 359 303   Cardiac Enzymes: No results for input(s): "CKTOTAL", "CKMB", "CKMBINDEX", "TROPONINI" in the last 168 hours. BNP: BNP (last 3 results) Recent Labs    01/13/22 2358 02/24/22 0702 03/15/22 0758  BNP 3,212.1* 3,388.6* 3,259.1*    ProBNP (last 3 results) No results for input(s): "PROBNP" in the last 8760 hours.  CBG: Recent Labs  Lab 03/15/22 0821  GLUCAP 104*       Signed:  14/05/23 MD.  [facility] 03/21/2022, 1:52 PM

## 2022-03-21 NOTE — TOC Transition Note (Signed)
Transition of Care [facility]) - CM/SW Discharge Note   Patient Details  Name: Allen Mayer MRN: [id number] Date of Birth: [birth year]  [facility] CM/SW Contact:  Erin Sons, LCSW Phone Number: 03/21/2022, 11:10 AM   Clinical Narrative:      Pt has secured housing placement through the [place] and is scheduled for intake today. He is set for DC. CSW called Melinda Crutch (transport person) [phone] who will arrange for pt to be picked up at [facility] tower around 11am. Transport will call the unit upon arrival.        Patient Goals and CMS Choice        Discharge Placement                       Discharge Plan and Services                                     Social Determinants of Health (SDOH) Interventions     Readmission Risk Interventions    11/04/2021    2:35 PM 12/18/2020   12:36 PM  Readmission Risk Prevention Plan  Post Dischage Appt    Medication Screening    Transportation Screening Complete   Medication Review (RN Care Manager) Complete   PCP or Specialist appointment within 3-5 days of discharge Complete   HRI or Home Care Consult Complete   SW Recovery Care/Counseling Consult Complete   Palliative Care Screening Not Applicable   Skilled Nursing Facility Not Applicable      Information is confidential and restricted. Go to Review Flowsheets to unlock data.

## 2022-03-21 NOTE — Progress Notes (Signed)
AVS and prescriptions meds from [facility] given to patient.

## 2022-03-21 NOTE — TOC Transition Note (Signed)
Transition of Care [facility]) - CM/SW Discharge Note   Patient Details  Name: Allen Mayer MRN: [id number] Date of Birth: [birth year]  Transition of Care [facility]) CM/SW Contact:  Leone Haven, RN Phone Number: 03/21/2022, 10:16 AM   Clinical Narrative:     Patient is for dc today, per previous NCM note, the [facility] a [facility] for [facility] patient to stay and they will provide transportation as well. The [facility] transport will [facility] him today.         Patient Goals and CMS Choice        Discharge Placement                       Discharge Plan and Services                                     Social Determinants of Health (SDOH) Interventions     Readmission Risk Interventions    11/04/2021    2:35 PM 12/18/2020   12:36 PM  Readmission Risk Prevention Plan  Post Dischage Appt    Medication Screening    Transportation Screening Complete   Medication Review (RN Care Manager) Complete   PCP or Specialist appointment within 3-5 days of discharge Complete   HRI or Home Care Consult Complete   SW Recovery Care/Counseling Consult Complete   Palliative Care Screening Not Applicable   Skilled Nursing Facility Not Applicable      Information is confidential and restricted. Go to Review Flowsheets to unlock data.

## 2022-03-21 NOTE — TOC Transition Note (Signed)
Discharge medications (5) are being stored in the Transitions of Care (TOC) Pharmacy on the second floor until patient is ready for discharge.    

## 2022-04-23 ENCOUNTER — Other Ambulatory Visit: Payer: Self-pay | Admitting: Physician Assistant

## 2023-12-23 ENCOUNTER — Observation Stay (HOSPITAL_COMMUNITY)
Admission: EM | Admit: 2023-12-23 | Discharge: 2023-12-25 | Disposition: A | Discharge status: Home / Self Care | Attending: Internal Medicine | Admitting: Internal Medicine

## 2023-12-23 ENCOUNTER — Other Ambulatory Visit: Payer: Self-pay

## 2023-12-23 ENCOUNTER — Encounter (HOSPITAL_COMMUNITY): Payer: Self-pay | Admitting: *Deleted

## 2023-12-23 ENCOUNTER — Emergency Department (HOSPITAL_COMMUNITY)

## 2023-12-23 DIAGNOSIS — R001 Bradycardia, unspecified: Secondary | ICD-10-CM | POA: Diagnosis not present

## 2023-12-23 DIAGNOSIS — I130 Hypertensive heart and chronic kidney disease with heart failure and stage 1 through stage 4 chronic kidney disease, or unspecified chronic kidney disease: Secondary | ICD-10-CM | POA: Diagnosis not present

## 2023-12-23 DIAGNOSIS — F17220 Nicotine dependence, chewing tobacco, uncomplicated: Secondary | ICD-10-CM | POA: Insufficient documentation

## 2023-12-23 DIAGNOSIS — M792 Neuralgia and neuritis, unspecified: Secondary | ICD-10-CM | POA: Insufficient documentation

## 2023-12-23 DIAGNOSIS — R0789 Other chest pain: Principal | ICD-10-CM

## 2023-12-23 DIAGNOSIS — Z7982 Long term (current) use of aspirin: Secondary | ICD-10-CM | POA: Diagnosis not present

## 2023-12-23 DIAGNOSIS — I5022 Chronic systolic (congestive) heart failure: Secondary | ICD-10-CM | POA: Insufficient documentation

## 2023-12-23 DIAGNOSIS — J449 Chronic obstructive pulmonary disease, unspecified: Secondary | ICD-10-CM | POA: Diagnosis not present

## 2023-12-23 DIAGNOSIS — R9431 Abnormal electrocardiogram [ECG] [EKG]: Secondary | ICD-10-CM

## 2023-12-23 DIAGNOSIS — I10 Essential (primary) hypertension: Secondary | ICD-10-CM | POA: Diagnosis present

## 2023-12-23 DIAGNOSIS — R079 Chest pain, unspecified: Secondary | ICD-10-CM | POA: Diagnosis present

## 2023-12-23 DIAGNOSIS — F10920 Alcohol use, unspecified with intoxication, uncomplicated: Secondary | ICD-10-CM | POA: Diagnosis not present

## 2023-12-23 DIAGNOSIS — I5020 Unspecified systolic (congestive) heart failure: Secondary | ICD-10-CM | POA: Diagnosis present

## 2023-12-23 DIAGNOSIS — F259 Schizoaffective disorder, unspecified: Secondary | ICD-10-CM | POA: Diagnosis not present

## 2023-12-23 DIAGNOSIS — F1410 Cocaine abuse, uncomplicated: Secondary | ICD-10-CM | POA: Diagnosis present

## 2023-12-23 DIAGNOSIS — N183 Chronic kidney disease, stage 3 unspecified: Secondary | ICD-10-CM | POA: Insufficient documentation

## 2023-12-23 DIAGNOSIS — Z720 Tobacco use: Secondary | ICD-10-CM | POA: Diagnosis present

## 2023-12-23 DIAGNOSIS — E785 Hyperlipidemia, unspecified: Secondary | ICD-10-CM | POA: Diagnosis present

## 2023-12-23 DIAGNOSIS — Z91199 Patient's noncompliance with other medical treatment and regimen due to unspecified reason: Secondary | ICD-10-CM | POA: Insufficient documentation

## 2023-12-23 DIAGNOSIS — R0781 Pleurodynia: Principal | ICD-10-CM | POA: Insufficient documentation

## 2023-12-23 DIAGNOSIS — I513 Intracardiac thrombosis, not elsewhere classified: Secondary | ICD-10-CM | POA: Diagnosis present

## 2023-12-23 DIAGNOSIS — F12920 Cannabis use, unspecified with intoxication, uncomplicated: Secondary | ICD-10-CM | POA: Diagnosis not present

## 2023-12-23 LAB — CBC
HCT: 48 % (ref 39.0–52.0)
Hemoglobin: 15.1 g/dL (ref 13.0–17.0)
MCH: 28.5 pg (ref 26.0–34.0)
MCHC: 31.5 g/dL (ref 30.0–36.0)
MCV: 90.6 fL (ref 80.0–100.0)
Platelets: 239 K/uL (ref 150–400)
RBC: 5.3 MIL/uL (ref 4.22–5.81)
RDW: 14.6 % (ref 11.5–15.5)
WBC: 6.2 K/uL (ref 4.0–10.5)
nRBC: 0 % (ref 0.0–0.2)

## 2023-12-23 LAB — I-STAT CHEM 8, ED
BUN: 20 mg/dL (ref 8–23)
Calcium, Ion: 1.19 mmol/L (ref 1.15–1.40)
Chloride: 105 mmol/L (ref 98–111)
Creatinine, Ser: 1 mg/dL (ref 0.61–1.24)
Glucose, Bld: 100 mg/dL — ABNORMAL HIGH (ref 70–99)
HCT: 49 % (ref 39.0–52.0)
Hemoglobin: 16.7 g/dL (ref 13.0–17.0)
Potassium: 3.9 mmol/L (ref 3.5–5.1)
Sodium: 141 mmol/L (ref 135–145)
TCO2: 24 mmol/L (ref 22–32)

## 2023-12-23 LAB — BASIC METABOLIC PANEL WITH GFR
Anion gap: 11 (ref 5–15)
BUN: 17 mg/dL (ref 8–23)
CO2: 25 mmol/L (ref 22–32)
Calcium: 9.3 mg/dL (ref 8.9–10.3)
Chloride: 103 mmol/L (ref 98–111)
Creatinine, Ser: 0.98 mg/dL (ref 0.61–1.24)
GFR, Estimated: >60 mL/min (ref >60)
Glucose, Bld: 100 mg/dL — ABNORMAL HIGH (ref 70–99)
Potassium: 4 mmol/L (ref 3.5–5.1)
Sodium: 139 mmol/L (ref 135–145)

## 2023-12-23 LAB — TROPONIN I (HIGH SENSITIVITY): Troponin I (High Sensitivity): 19 ng/L — ABNORMAL HIGH (ref <18)

## 2023-12-23 NOTE — ED Triage Notes (Signed)
 The pt is c/o total body for 3-4 days and he has some soreness  in his chest with swelling in his legs.  He also ran out of meds 3-4 days ago

## 2023-12-23 NOTE — ED Triage Notes (Signed)
 The pt just got dismissed from prison

## 2023-12-24 ENCOUNTER — Observation Stay (HOSPITAL_COMMUNITY)

## 2023-12-24 ENCOUNTER — Emergency Department (HOSPITAL_COMMUNITY)

## 2023-12-24 DIAGNOSIS — I5020 Unspecified systolic (congestive) heart failure: Secondary | ICD-10-CM | POA: Diagnosis not present

## 2023-12-24 DIAGNOSIS — E785 Hyperlipidemia, unspecified: Secondary | ICD-10-CM

## 2023-12-24 DIAGNOSIS — R079 Chest pain, unspecified: Secondary | ICD-10-CM | POA: Diagnosis not present

## 2023-12-24 DIAGNOSIS — I513 Intracardiac thrombosis, not elsewhere classified: Secondary | ICD-10-CM

## 2023-12-24 DIAGNOSIS — I10 Essential (primary) hypertension: Secondary | ICD-10-CM

## 2023-12-24 DIAGNOSIS — Z720 Tobacco use: Secondary | ICD-10-CM

## 2023-12-24 DIAGNOSIS — R001 Bradycardia, unspecified: Secondary | ICD-10-CM

## 2023-12-24 DIAGNOSIS — F1410 Cocaine abuse, uncomplicated: Secondary | ICD-10-CM

## 2023-12-24 LAB — ECHOCARDIOGRAM COMPLETE
Area-P 1/2: 2.91 cm2
Height: 72 in
S' Lateral: 4.1 cm
Weight: 2504.43 [oz_av]

## 2023-12-24 LAB — RAPID URINE DRUG SCREEN, HOSP PERFORMED
Amphetamines: NOT DETECTED
Barbiturates: NOT DETECTED
Benzodiazepines: NOT DETECTED
Cocaine: POSITIVE — AB
Opiates: NOT DETECTED
Tetrahydrocannabinol: NOT DETECTED

## 2023-12-24 LAB — BRAIN NATRIURETIC PEPTIDE: B Natriuretic Peptide: 11.7 pg/mL (ref 0.0–100.0)

## 2023-12-24 LAB — HIV ANTIBODY (ROUTINE TESTING W REFLEX): HIV Screen 4th Generation wRfx: NONREACTIVE

## 2023-12-24 LAB — RESP PANEL BY RT-PCR (RSV, FLU A&B, COVID)  RVPGX2
Influenza A by PCR: NEGATIVE
Influenza B by PCR: NEGATIVE
Resp Syncytial Virus by PCR: NEGATIVE
SARS Coronavirus 2 by RT PCR: NEGATIVE

## 2023-12-24 LAB — DIGOXIN LEVEL: Digoxin Level: <0.6 ng/mL — ABNORMAL LOW (ref 0.8–2.0)

## 2023-12-24 LAB — TROPONIN I (HIGH SENSITIVITY): Troponin I (High Sensitivity): 16 ng/L (ref <18)

## 2023-12-24 MED ORDER — RIVAROXABAN 20 MG PO TABS: 20.0000 mg | ORAL_TABLET | Freq: Every day | ORAL | 0 refills | Status: DC

## 2023-12-24 MED ORDER — EMPAGLIFLOZIN 10 MG PO TABS
10.0000 mg | ORAL_TABLET | Freq: Every day | ORAL | Status: DC
  Administered 2023-12-24 – 2023-12-25 (×2): 10 mg via ORAL
  Filled 2023-12-24 (×2): qty 1

## 2023-12-24 MED ORDER — SODIUM CHLORIDE 0.9% FLUSH
3.0000 mL | Freq: Two times a day (BID) | INTRAVENOUS | Status: DC
  Administered 2023-12-25: 3 mL via INTRAVENOUS

## 2023-12-24 MED ORDER — TORSEMIDE 20 MG PO TABS
20.0000 mg | ORAL_TABLET | Freq: Every day | ORAL | Status: DC
Start: 2023-12-24 — End: 2023-12-25
  Administered 2023-12-24: 20 mg via ORAL
  Filled 2023-12-24 (×2): qty 1

## 2023-12-24 MED ORDER — ATORVASTATIN CALCIUM 80 MG PO TABS: 80.0000 mg | ORAL_TABLET | Freq: Every day | ORAL | 0 refills | Status: DC

## 2023-12-24 MED ORDER — ASPIRIN 81 MG PO TBEC
81.0000 mg | DELAYED_RELEASE_TABLET | Freq: Every day | ORAL | Status: DC
  Administered 2023-12-24 – 2023-12-25 (×2): 81 mg via ORAL
  Filled 2023-12-24 (×2): qty 1

## 2023-12-24 MED ORDER — RIVAROXABAN 20 MG PO TABS
20.0000 mg | ORAL_TABLET | Freq: Every day | ORAL | Status: DC
  Administered 2023-12-24: 20 mg via ORAL
  Filled 2023-12-24: qty 1

## 2023-12-24 MED ORDER — OXYCODONE-ACETAMINOPHEN 5-325 MG PO TABS: 1.0000 | ORAL_TABLET | Freq: Three times a day (TID) | ORAL | Status: DC | PRN

## 2023-12-24 MED ORDER — EMPAGLIFLOZIN 10 MG PO TABS: 10.0000 mg | ORAL_TABLET | Freq: Every day | ORAL | 0 refills | Status: DC

## 2023-12-24 MED ORDER — ACETAMINOPHEN 650 MG RE SUPP: 650.0000 mg | Freq: Four times a day (QID) | RECTAL | Status: DC | PRN

## 2023-12-24 MED ORDER — ACETAMINOPHEN 325 MG PO TABS: 650.0000 mg | ORAL_TABLET | Freq: Four times a day (QID) | ORAL | Status: DC | PRN

## 2023-12-24 MED ORDER — ONDANSETRON HCL 4 MG/2ML IJ SOLN: 4.0000 mg | Freq: Four times a day (QID) | INTRAMUSCULAR | Status: DC | PRN

## 2023-12-24 MED ORDER — GABAPENTIN 400 MG PO CAPS: 400.0000 mg | ORAL_CAPSULE | Freq: Every day | ORAL | Status: DC | PRN

## 2023-12-24 MED ORDER — DIGOXIN 125 MCG PO TABS: 0.1250 mg | ORAL_TABLET | Freq: Every day | ORAL | 1 refills | Status: DC

## 2023-12-24 MED ORDER — ASPIRIN 81 MG PO TBEC: 81.0000 mg | DELAYED_RELEASE_TABLET | Freq: Every day | ORAL | 0 refills | Status: DC

## 2023-12-24 MED ORDER — ACETAMINOPHEN 500 MG PO TABS
1000.0000 mg | ORAL_TABLET | Freq: Once | ORAL | Status: AC
Start: 2023-12-24 — End: 2023-12-24
  Administered 2023-12-24: 1000 mg via ORAL
  Filled 2023-12-24: qty 2

## 2023-12-24 MED ORDER — VITAMIN D3 25 MCG (1000 UNIT) PO TABS: 5000.0000 [IU] | ORAL_TABLET | Freq: Every day | ORAL | 0 refills | Status: DC

## 2023-12-24 MED ORDER — DIGOXIN 125 MCG PO TABS
0.1250 mg | ORAL_TABLET | Freq: Every day | ORAL | Status: DC
Start: 2023-12-24 — End: 2023-12-25
  Administered 2023-12-24 – 2023-12-25 (×2): 0.125 mg via ORAL
  Filled 2023-12-24 (×2): qty 1

## 2023-12-24 MED ORDER — IOHEXOL 350 MG/ML SOLN
75.0000 mL | Freq: Once | INTRAVENOUS | Status: AC | PRN
  Administered 2023-12-24: 75 mL via INTRAVENOUS

## 2023-12-24 MED ORDER — ONDANSETRON HCL 4 MG PO TABS: 4.0000 mg | ORAL_TABLET | Freq: Four times a day (QID) | ORAL | Status: DC | PRN

## 2023-12-24 MED ORDER — LOSARTAN POTASSIUM 25 MG PO TABS
25.0000 mg | ORAL_TABLET | Freq: Every day | ORAL | Status: DC
Start: 2023-12-24 — End: 2023-12-25
  Administered 2023-12-24 – 2023-12-25 (×2): 25 mg via ORAL
  Filled 2023-12-24 (×2): qty 1

## 2023-12-24 MED ORDER — ATORVASTATIN CALCIUM 80 MG PO TABS
80.0000 mg | ORAL_TABLET | Freq: Every day | ORAL | Status: DC
  Administered 2023-12-24 – 2023-12-25 (×2): 80 mg via ORAL
  Filled 2023-12-24 (×2): qty 1

## 2023-12-24 MED ORDER — LOSARTAN POTASSIUM 25 MG PO TABS: 25.0000 mg | ORAL_TABLET | Freq: Every day | ORAL | 0 refills | Status: DC

## 2023-12-24 MED ORDER — ALBUTEROL SULFATE (2.5 MG/3ML) 0.083% IN NEBU: 2.5000 mg | INHALATION_SOLUTION | Freq: Four times a day (QID) | RESPIRATORY_TRACT | Status: DC | PRN

## 2023-12-24 MED ORDER — SPIRONOLACTONE 12.5 MG HALF TABLET
12.5000 mg | ORAL_TABLET | Freq: Every day | ORAL | Status: DC
Start: 2023-12-24 — End: 2023-12-25
  Administered 2023-12-24 – 2023-12-25 (×2): 12.5 mg via ORAL
  Filled 2023-12-24 (×2): qty 1

## 2023-12-24 NOTE — ED Provider Notes (Signed)
 [place] [facility] Provider Note   CSN: [id number] Arrival date & time: 12/23/23  2142     History  Chief Complaint  Patient presents with   Chest Pain    Allen Mayer is a 63 y.o. 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  He also ran out of meds 3-4 days ago, but he still has his blood thinner.  He reports he has not missed any doses of his blood thinner.. Was just released from prison.  He endorses a cough productive of white phlegm but denies any shortness of breath or fever.  Denies exertional symptoms, diaphoresis, leg swelling, nausea vomiting abdominal pain diarrhea.   Past Medical History:  Diagnosis Date   Acid reflux    Alcohol abuse    Arthritis    Asthma    Back pain    Bronchitis    Chest pain 07/31/2021   CHF (congestive heart failure) (HCC)    CKD (chronic kidney disease)    CKD (chronic kidney disease), stage III (HCC)    Cocaine abuse (HCC)    COPD (chronic obstructive pulmonary disease) (HCC)    History of noncompliance with medical treatment, presenting hazards to health    Homelessness    Hypertension    LV (left ventricular) mural thrombus    Myocardial infarction [facility])    Neuropathic pain    NICM (nonischemic cardiomyopathy) (HCC) 2019   NSTEMI (non-ST elevated myocardial infarction) (HCC) 07/31/2021   NSVT (nonsustained ventricular tachycardia) (HCC)    Pericardial effusion    Pulmonary hypertension (HCC)    RVF (right ventricular failure) (HCC)    Schizo affective schizophrenia (HCC)        Home Medications Prior to Admission medications   Medication Sig Start Date End Date Taking? Authorizing Provider  albuterol  (VENTOLIN  HFA) 108 (90  Base) MCG/ACT inhaler Inhale 1-2 puffs into the lungs every 6 (six) hours as needed for wheezing or shortness of breath. 01/17/22   Tobie Yetta HERO, MD  ARIPiprazole  (ABILIFY ) 10 MG tablet Take 1 tablet (10 mg total) by mouth daily. 03/18/22   Fairy Frames, MD  aspirin  EC 81 MG tablet Take 1 tablet (81 mg total) by mouth daily. Swallow whole. 03/18/22   Fairy Frames, MD  atorvastatin  (LIPITOR ) 80 MG tablet Take 1 tablet (80 mg total) by mouth daily. 01/17/22   Tobie Yetta HERO, MD  benzonatate  (TESSALON ) 100 MG capsule Take 1 capsule (100 mg total) by mouth 3 (three) times daily. 01/17/22   Tobie Yetta HERO, MD  benztropine  (COGENTIN ) 1 MG tablet Take 1 tablet (1 mg total) by mouth 2 (two) times daily. 03/18/22   Fairy Frames, MD  citalopram  (CELEXA ) 20 MG tablet Take 1 tablet (20 mg total) by mouth daily. 01/17/22   Tobie Yetta HERO, MD  dextromethorphan -guaiFENesin  (MUCINEX  DM) 30-600 MG 12hr tablet Take 1 tablet by mouth 2 (two) times daily. Patient taking differently: Take 1 tablet by mouth 2 (two) times daily as needed for cough. 01/17/22   Tobie Yetta HERO, MD  digoxin  (LANOXIN ) 0.125 MG tablet Take 1 tablet (0.125 mg total) by mouth daily. 02/28/22 03/30/22  Arrien, Elidia Sieving, MD  empagliflozin  (JARDIANCE ) 10 MG TABS tablet Take 1 tablet (10 mg  total) by mouth daily. 02/28/22   Arrien, Mauricio Daniel, MD  gabapentin  (NEURONTIN ) 400 MG capsule Take 1 capsule (400 mg total) by mouth daily as needed (nerve pain). 03/18/22   Fairy Frames, MD  losartan  (COZAAR ) 25 MG tablet Take 1 tablet (25 mg total) by mouth daily. 02/28/22 03/30/22  Arrien, Elidia Sieving, MD  mometasone -formoterol  (DULERA ) 200-5 MCG/ACT AERO Inhale 2 puffs into the lungs 2 (two) times daily. 02/28/22   Arrien, Mauricio Daniel, MD  nitroGLYCERIN  (NITROSTAT ) 0.4 MG SL tablet Place 1 tablet (0.4 mg total) under the tongue every 5 (five) minutes as needed for chest pain. 10/15/21   Lavona Agent, MD  pantoprazole  (PROTONIX )  40 MG tablet Take 1 tablet (40 mg total) by mouth daily. 01/17/22   Tobie Yetta HERO, MD  rivaroxaban  (XARELTO ) 20 MG TABS tablet Take 1 tablet (20 mg total) by mouth daily with supper. 01/17/22   Tobie Yetta HERO, MD  spironolactone  (ALDACTONE ) 25 MG tablet Take 1/2 tablets (12.5 mg total) by mouth daily. 02/28/22 03/30/22  Arrien, Elidia Sieving, MD  torsemide  (DEMADEX ) 20 MG tablet Take 1 tablet (20 mg total) by mouth daily. 04/25/22 05/25/22  Barrett, Shona MATSU, PA-C  umeclidinium bromide  (INCRUSE ELLIPTA ) 62.5 MCG/ACT AEPB Inhale 1 puff into the lungs daily. 01/18/22   Tobie Yetta HERO, MD      Allergies    Flexeril  [cyclobenzaprine ]    Review of Systems   Review of Systems A 10 point review of systems was performed and is negative unless otherwise reported in HPI.  Physical Exam Updated Vital Signs BP (!) 155/85 (BP Location: Left Arm)   Pulse (!) 51   Temp 98.1 F (36.7 C) (Oral)   Resp 20   Ht 6' (1.829 m)   Wt 71 kg   SpO2 98%   BMI 21.23 kg/m  Physical Exam General: Normal appearing male, lying in bed.  HEENT: Sclera anicteric, MMM, trachea midline.  Cardiology: RRR, no murmurs/rubs/gallops. BL radial and DP pulses equal bilaterally. No chest wall TTP deformities or signs of injury. Resp: Normal respiratory rate and effort. CTAB, no wheezes, rhonchi, crackles.  Abd: Soft, non-tender, non-distended. No rebound tenderness or guarding.  GU: Deferred. MSK: No peripheral edema or signs of trauma. Extremities without deformity or TTP. No cyanosis or clubbing. Skin: warm, dry.  Neuro: A&Ox4, CNs II-XII grossly intact. MAEs. Sensation grossly intact.  Psych: Normal mood and affect.   ED Results / Procedures / Treatments   Labs (all labs ordered are listed, but only abnormal results are displayed) Labs Reviewed  BASIC METABOLIC PANEL WITH GFR - Abnormal; Notable for the following components:      Result Value   Glucose, Bld 100 (*)    All other components within normal limits   RAPID URINE DRUG SCREEN, HOSP PERFORMED - Abnormal; Notable for the following components:   Cocaine POSITIVE (*)    All other components within normal limits  I-STAT CHEM 8, ED - Abnormal; Notable for the following components:   Glucose, Bld 100 (*)    All other components within normal limits  TROPONIN I (HIGH SENSITIVITY) - Abnormal; Notable for the following components:   Troponin I (High Sensitivity) 19 (*)    All other components within normal limits  RESP PANEL BY RT-PCR (RSV, FLU A&B, COVID)  RVPGX2  CBC  BRAIN NATRIURETIC PEPTIDE  TROPONIN I (HIGH SENSITIVITY)    EKG EKG Interpretation Date/Time:  Saturday December 23 2023 22:22:14 EDT Ventricular Rate:  59 PR Interval:  248 QRS Duration:  88 QT Interval:  412 QTC Calculation: 407 R Axis:   5  Text Interpretation: Sinus bradycardia with sinus arrhythmia with 1st degree A-V block Left ventricular hypertrophy with repolarization abnormality ( Cornell product ) Anteroseptal infarct , age undetermined  Confirmed by Franklyn Gills [phone]) on 12/24/2023 3:03:39 AM  Radiology CT Angio Chest PE W and/or Wo Contrast Result Date: 12/24/2023 EXAM: CTA of the Chest with contrast for PE 12/24/2023 05:29:18 AM TECHNIQUE: CTA of the chest was performed after the administration of intravenous contrast. Multiplanar reformatted images are provided for review. MIP images are provided for review. Automated exposure control, iterative reconstruction, and/or weight based adjustment of the mA/kV was utilized to reduce the radiation dose to as low as reasonably achievable. COMPARISON: 05/30/2017 CLINICAL HISTORY: Pulmonary embolism (PE) suspected, high prob. Cp, leg swelling FINDINGS: PULMONARY ARTERIES: Pulmonary arteries are adequately opacified for evaluation. No pulmonary embolism. Main pulmonary artery is normal in caliber. MEDIASTINUM: The heart and pericardium demonstrate no acute abnormality. There is no acute abnormality of the  thoracic aorta. Aortic atherosclerotic calcification and coronary artery calcification. LYMPH NODES: No mediastinal, hilar or axillary lymphadenopathy. LUNGS AND PLEURA: Band like area of scarring noted within the lingula. Nodule within the superior segment of the right lower lobe is new from previous exam measuring 5 mm, image 51/6. Unchanged, broad-based pleural nodule overlying the posterior medial left upper lobe measuring 0.9 cm, image 44/6. This most likely represents a benign abnormality. UPPER ABDOMEN: Limited images of the upper abdomen are unremarkable. SOFT TISSUES AND BONES: No acute bone or soft tissue abnormality. IMPRESSION: 1. No evidence of pulmonary embolism. 2. New 5 mm nodule in the superior segment of the right lower lobe.In a patient who was at low risk, no further follow-up is indicated. If the patient is at increased risk a follow-up CT of the chest without contrast material in 12 months may be considered. 3. Unchanged 0.9 cm broad-based pleural nodule overlying the posterior medial left upper lobe, likely benign. Electronically signed by: Waddell Calk MD 12/24/2023 06:08 AM EDT RP Workstation: [id number]   DG Chest 2 View Result Date: 12/23/2023 CLINICAL DATA:  Chest pain and shortness of breath EXAM: CHEST - 2 VIEW COMPARISON:  03/15/2022 FINDINGS: The heart size and mediastinal contours are within normal limits. Both lungs are clear. The visualized skeletal structures are unremarkable. IMPRESSION: No active cardiopulmonary disease. Electronically Signed   By: Oneil Devonshire M.D.   On: 12/23/2023 23:25    Procedures Ultrasound ED Echo  Date/Time: 12/24/2023 6:29 AM  Performed by: Franklyn Gills SAILOR, MD Authorized by: Franklyn Gills SAILOR, MD   Procedure details:    Indications: chest pain     Views: parasternal long axis view, parasternal short axis view and apical 4 chamber view     Images: not archived     Limitations:  Acoustic shadowing Findings:    Pericardium: no pericardial  effusion     LV Function: severly depressed (<30%)   Impression:    Impression: decreased contractility     Impression comment:  With no visible LV thrombus or pericardial effusion     Medications Ordered in ED Medications  acetaminophen  (TYLENOL ) tablet 1,000 mg (has no administration in time range)  iohexol  (OMNIPAQUE ) 350 MG/ML injection 75 mL (75 mLs Intravenous Contrast Given 12/24/23 0530)    ED Course/ Medical Decision Making/ A&P  Medical Decision Making Amount and/or Complexity of Data Reviewed Labs: ordered. Decision-making details documented in ED Course. Radiology: ordered. Decision-making details documented in ED Course.  Risk OTC drugs. Prescription drug management. Decision regarding hospitalization.    This patient presents to the ED for concern of chest pain, this involves an extensive number of treatment options, and is a complaint that carries with it a high risk of complications and morbidity.  I considered the following differential and admission for this acute, potentially life threatening condition. Pt is overall well-appearing and HDS.   MDM:    DDX for chest pain includes but is not limited to:  Patient gestures in his lower rib cage when describing the pain and states that it is worse with deep breaths and with certain movements.  Have higher suspicion for musculoskeletal pain in the setting of cough and likely bronchitis.  Patient has a negative chest x-ray for any pneumonia and a CT PE does not demonstrate any pulmonary embolism or other thrombosis.  Also performed a bedside echocardiogram which demonstrated severely reduced EF grossly but no visible LV thrombus or pericardial effusion. He has no chest wall TTP however, lower c/f costochondritis or muscle spasm. No EKG signs of pericarditis. Lower c/f biliary disease or esophageal spasms. UDS +cocaine as well but patient states CP not distinctly associated w/ cocaine use.    Performed SDM with patient and given his history will admit patient for CP w/u.   Clinical Course as of 12/24/23 9346  [place] Dec 24, 2023  0303 Troponin I (High Sensitivity): 16 Negative tropoinin x2.  [HN]  0304 DG Chest 2 View No active cardiopulmonary disease. [HN]  0603 B Natriuretic Peptide: 11.7 neg [HN]  0603 Resp panel by RT-PCR (RSV, Flu A&B, Covid) Anterior Nasal Swab neg [HN]  9374 CT Angio Chest PE W and/or Wo Contrast 1. No evidence of pulmonary embolism. 2. New 5 mm nodule in the superior segment of the right lower lobe.In a patient who was at low risk, no further follow-up is indicated. If the patient is at increased risk a follow-up CT of the chest without contrast material in 12 months may be considered. 3. Unchanged 0.9 cm broad-based pleural nodule overlying the posterior medial left upper lobe, likely benign.   [HN]  9374 COCAINE(!): POSITIVE [HN]  0645 Refilled patient's atorvastatin , digoxin , losartan , aspirin , Jardiance , Xarelto , and vitamin D3 per his request [HN]  9347 Discussed with patient and performed shared decision making.  Given patient's extensive history we will offer admission for chest pain and patient would rather be admitted [HN]    Clinical Course User Index [HN] Franklyn Sid SAILOR, MD    Labs: I Ordered, and personally interpreted labs.  The pertinent results include: Those listed above  Imaging Studies ordered: I ordered imaging studies including chest x-ray, CT PE I independently visualized and interpreted imaging. I agree with the radiologist interpretation  Additional history obtained from chart review.    Cardiac Monitoring: The patient was maintained on a cardiac monitor.  I personally viewed and interpreted the cardiac monitored which showed an underlying rhythm of: Normal sinus rhythm or sinus bradycardia  Reevaluation: After the interventions noted above, I reevaluated the patient and found that they have :improved  Social  Determinants of Health:  lives independently  Disposition:  admit to medicine  Co morbidities that complicate the patient evaluation  Past Medical History:  Diagnosis Date   Acid reflux    Alcohol abuse    Arthritis  Asthma    Back pain    Bronchitis    Chest pain 07/31/2021   CHF (congestive heart failure) (HCC)    CKD (chronic kidney disease)    CKD (chronic kidney disease), stage III (HCC)    Cocaine abuse (HCC)    COPD (chronic obstructive pulmonary disease) (HCC)    History of noncompliance with medical treatment, presenting hazards to health    Homelessness    Hypertension    LV (left ventricular) mural thrombus    Myocardial infarction [facility])    Neuropathic pain    NICM (nonischemic cardiomyopathy) (HCC) 2019   NSTEMI (non-ST elevated myocardial infarction) (HCC) 07/31/2021   NSVT (nonsustained ventricular tachycardia) (HCC)    Pericardial effusion    Pulmonary hypertension (HCC)    RVF (right ventricular failure) (HCC)    Schizo affective schizophrenia (HCC)      Medicines Meds ordered this encounter  Medications   iohexol  (OMNIPAQUE ) 350 MG/ML injection 75 mL   acetaminophen  (TYLENOL ) tablet 1,000 mg    I have reviewed the patients home medicines and have made adjustments as needed  Problem List / ED Course: Problem List Items Addressed This Visit   None Visit Diagnoses       Atypical chest pain    -  Primary                   This note was created using dictation software, which may contain spelling or grammatical errors.    Franklyn Sid SAILOR, MD 12/27/23 1409

## 2023-12-24 NOTE — ED Notes (Signed)
 MD Franklyn has pts medications in her possesion

## 2023-12-24 NOTE — ED Provider Notes (Signed)
 Patient signed out to me by previous provider. Please refer to their note for full HPI.  Briefly this is a 63 year old male with extensive comorbidities including an EF around 20% on an echo over 2 years ago who presented with chest pain.  Workup shows that he is positive for cocaine, admits that last use was 2 days ago.  Troponin was downtrending from 19-16 but more concerning EKG has new T wave inversions in the inferior lateral leads.  Patient is currently chest pain-free while at rest but with the comorbidities, concerning echo 2 years ago with new EKG changes and chest pain we will recommend inpatient evaluation.   Patient signed out pending admission.   Patients evaluation and results requires admission for further treatment and care.  Spoke with hospitalist, reviewed patient's ED course and they accept admission.  Patient agrees with admission plan, offers no new complaints and is stable/unchanged at time of admit.   Bari Roxie HERO, [place] 12/24/23 [phone]

## 2023-12-24 NOTE — H&P (Signed)
 History and Physical    Patient: Allen Mayer FMW:[id number] DOB: 02-09-1961 DOA: 12/23/2023 DOS: the patient was seen and examined on 12/24/2023 PCP: Patient, No Pcp Per  Patient coming from: Home  Chief Complaint:  Chief Complaint  Patient presents with   Chest Pain   HPI: Allen Mayer is a 63 y.o. 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  Patient reports just recently being released from being blocked up  He has been experiencing chest pain and rib cage discomfort for the past three to four days, located bilaterally on the rib cage and exacerbated by breathing and certain movements. There is no history of recent falls or injuries. Despite attempts to alleviate the pain, including the use of substances, the discomfort persists.  He reports general fatigue without any specific activity triggering the tiredness. No nausea, vomiting, diarrhea, sweating, fainting, leg swelling, or calf pain. He reports occasional coughing and has a history of COPD.  He has a history of smoking, currently smoking one to two cigarettes per day. He also has a history of cocaine use, with the last use reported two to three days ago, which he mentions was an attempt to alleviate the chest pain.  He is currently on Xarelto  but has run out of medications including spironolactone  and Jardiance . He mentions previous use of gabapentin  and oxycodone  for pain management.  In the ED patient was noted to be afebrile with pulse 51-64, blood pressures elevated up to 160/78, and all other vital signs maintained.  Labs significant for high-sensitivity troponin 19 -> 16 and BNP 11.7.  Chest x-ray noted no acute abnormality.  CT angiogram of the chest noted no signs of a pulmonary embolism with a new 5 mm nodule in the superior segment of the right lower lobe, and unchanged 0.9 cm  broad-based pleural nodule overlying the posterior medial left upper lobe.   Influenza, COVID-19, and RSV screening were negative. UDS positive for cocaine.    Review of Systems: As mentioned in the history of present illness. All other systems reviewed and are negative. Past Medical History:  Diagnosis Date   Acid reflux    Alcohol abuse    Arthritis    Asthma    Back pain    Bronchitis    Chest pain 07/31/2021   CHF (congestive heart failure) (HCC)    CKD (chronic kidney disease)    CKD (chronic kidney disease), stage III (HCC)    Cocaine abuse (HCC)    COPD (chronic obstructive pulmonary disease) (HCC)    History of noncompliance with medical treatment, presenting hazards to health    Homelessness    Hypertension    LV (left ventricular) mural thrombus    Myocardial infarction [facility])    Neuropathic pain    NICM (nonischemic cardiomyopathy) (HCC) 2019   NSTEMI (non-ST elevated myocardial infarction) (HCC) 07/31/2021   NSVT (nonsustained ventricular tachycardia) (HCC)    Pericardial effusion    Pulmonary hypertension (HCC)    RVF (right ventricular failure) (HCC)    Schizo affective schizophrenia (HCC)    Past Surgical History:  Procedure Laterality Date   I & D EXTREMITY Bilateral 12/14/2020   Procedure: IRRIGATION AND DEBRIDEMENT AND CLOSURE OF LACERTATIONS OF BILATEAL ARMS;  Surgeon: Dasie Leonor CROME, MD;  Location: [facility] OR;  Service: General;  Laterality: Bilateral;   LAPAROTOMY N/A 12/14/2020   Procedure: EXPLORATORY LAPAROTOMY;  Surgeon: Dasie,  Leonor CROME, MD;  Location: [facility] OR;  Service: General;  Laterality: N/A;   None     RIGHT HEART CATH AND CORONARY ANGIOGRAPHY N/A 08/02/2021   Procedure: RIGHT HEART CATH AND CORONARY ANGIOGRAPHY;  Surgeon: Mady Bruckner, MD;  Location: [facility] INVASIVE CV LAB;  Service: Cardiovascular;  Laterality: N/A;   RIGHT/LEFT HEART CATH AND CORONARY ANGIOGRAPHY N/A 12/13/2017   Procedure: RIGHT/LEFT HEART CATH AND CORONARY ANGIOGRAPHY;  Surgeon: Court Dorn PARAS, MD;  Location: [facility] INVASIVE CV LAB;  Service: Cardiovascular;  Laterality: N/A;   Social History:  reports that he has been smoking cigarettes. He has never used smokeless tobacco. He reports current alcohol use. He reports current drug use. Drugs: Marijuana and Cocaine.  Allergies  Allergen Reactions   Flexeril  [Cyclobenzaprine ] Other (See Comments)    Caused cramping and patient did not like the way it made it feel. Requests this medication to NOT be given to him.    Family History  Problem Relation Age of Onset   Heart attack Mother        Died age 76   Heart attack Brother        13    Prior to Admission medications   Medication Sig Start Date End Date Taking? Authorizing Provider  cholecalciferol (VITAMIN D3) 25 MCG (1000 UNIT) tablet Take 5 tablets (5,000 Units total) by mouth daily. 12/24/23 01/23/24 Yes Franklyn Sid SAILOR, MD  albuterol  (VENTOLIN  HFA) 108 (90 Base) MCG/ACT inhaler Inhale 1-2 puffs into the lungs every 6 (six) hours as needed for wheezing or shortness of breath. 01/17/22   Tobie Yetta HERO, MD  ARIPiprazole  (ABILIFY ) 10 MG tablet Take 1 tablet (10 mg total) by mouth daily. 03/18/22   Fairy Frames, MD  aspirin  EC 81 MG tablet Take 1 tablet (81 mg total) by mouth daily. Swallow whole. 12/24/23 02/22/24  Franklyn Sid SAILOR, MD  atorvastatin  (LIPITOR ) 80 MG tablet Take 1 tablet (80 mg total) by mouth daily. 12/24/23   Franklyn Sid SAILOR, MD  benzonatate  (TESSALON ) 100 MG capsule Take 1 capsule (100 mg total) by mouth 3 (three) times daily. 01/17/22   Tobie Yetta HERO, MD  benztropine  (COGENTIN ) 1 MG tablet Take 1 tablet (1 mg total) by mouth 2 (two) times daily. 03/18/22   Fairy Frames, MD  citalopram  (CELEXA ) 20 MG tablet Take 1 tablet (20 mg total) by mouth daily. 01/17/22   Tobie Yetta HERO, MD  dextromethorphan -guaiFENesin  (MUCINEX  DM) 30-600 MG 12hr tablet Take 1 tablet by mouth 2 (two) times daily. Patient taking differently: Take 1 tablet by mouth 2 (two) times  daily as needed for cough. 01/17/22   Tobie Yetta HERO, MD  digoxin  (LANOXIN ) 0.125 MG tablet Take 1 tablet (0.125 mg total) by mouth daily. 12/24/23 02/22/24  Franklyn Sid SAILOR, MD  empagliflozin  (JARDIANCE ) 10 MG TABS tablet Take 1 tablet (10 mg total) by mouth daily. 12/24/23 02/22/24  Franklyn Sid SAILOR, MD  gabapentin  (NEURONTIN ) 400 MG capsule Take 1 capsule (400 mg total) by mouth daily as needed (nerve pain). 03/18/22   Fairy Frames, MD  losartan  (COZAAR ) 25 MG tablet Take 1 tablet (25 mg total) by mouth daily. 12/24/23 02/22/24  Franklyn Sid SAILOR, MD  mometasone -formoterol  (DULERA ) 200-5 MCG/ACT AERO Inhale 2 puffs into the lungs 2 (two) times daily. 02/28/22   Arrien, Mauricio Daniel, MD  nitroGLYCERIN  (NITROSTAT ) 0.4 MG SL tablet Place 1 tablet (0.4 mg total) under the tongue every 5 (five) minutes as needed for chest pain. 10/15/21   Hochrein,  Lynwood, MD  pantoprazole  (PROTONIX ) 40 MG tablet Take 1 tablet (40 mg total) by mouth daily. 01/17/22   Tobie Yetta HERO, MD  rivaroxaban  (XARELTO ) 20 MG TABS tablet Take 1 tablet (20 mg total) by mouth daily with supper. 12/24/23 02/22/24  Franklyn Sid SAILOR, MD  spironolactone  (ALDACTONE ) 25 MG tablet Take 1/2 tablets (12.5 mg total) by mouth daily. 02/28/22 03/30/22  Arrien, Elidia Sieving, MD  torsemide  (DEMADEX ) 20 MG tablet Take 1 tablet (20 mg total) by mouth daily. 04/25/22 05/25/22  Barrett, Shona MATSU, PA-C  umeclidinium bromide  (INCRUSE ELLIPTA ) 62.5 MCG/ACT AEPB Inhale 1 puff into the lungs daily. 01/18/22   Tobie Yetta HERO, MD    Physical Exam: Vitals:   12/24/23 0207 12/24/23 0645 12/24/23 0700 12/24/23 0717  BP: (!) 155/85 136/78 135/80 (!) 160/78  Pulse: (!) 51 (!) 53 (!) 51 (!) 57  Resp: 20   18  Temp: 98.1 F (36.7 C)   98 F (36.7 C)  TempSrc: Oral   Oral  SpO2: 98% 100% 98% 100%  Weight:      Height:        Constitutional: Older adult male currently in no acute distress Eyes: PERRL, lids and conjunctivae normal ENMT: Mucous membranes  are moist. Normal dentition.  Neck: normal, supple, no JVD appreciated Respiratory: clear to auscultation bilaterally, no wheezing, no crackles. Normal respiratory effort. No accessory muscle use.  Cardiovascular: Regular rate and rhythm, positive systolic murmur present. no extremity edema.  Abdomen: no tenderness, no masses palpated. Bowel sounds positive.  Musculoskeletal: no clubbing / cyanosis. No joint deformity upper and lower extremities. Good ROM, no contractures. Normal muscle tone.  Skin: no rashes, lesions, ulcers. No induration Neurologic: CN 2-12 grossly intact. Strength 5/5 in all 4.  Psychiatric: Normal judgment and insight. Alert and oriented x 3. Normal mood.   Data Reviewed:  EKG shows sinus bradycardia with first-degree AV block and left ventricular hypertrophy T wave inversions in the inferior leads.  Reviewed labs, imaging, and pertinent records as documented.  Assessment and Plan:  Atypical chest pain Elevated troponin Patient presents with complaints of chest pain on the bilateral ribs that worsens with certain movements and deep breathing.  High-sensitivity troponins were just mildly elevated at 19 but repeat check within normal limits at 16.  EKG reportedly showed T wave inversions in inferior leads.  CT angiogram of the chest did not note any signs of a pulmonary embolism.  Suspect symptoms to be more likely musculoskeletal in nature - Admit to a cardiac telemetry bed - Check repeat echocardiogram - Consider need of formal consult to cardiology if  Sinus bradycardia First-degree AV block Patient noted to have heart rates in the 50s with blood pressures currently maintained. - Check dig level - Follow-up telemetry  Heart failure with reduced EF Chronic.  Patient does not appear to be acutely fluid overloaded at this time.  Last echocardiogram noted EF to be less than 20% - Strict I&O's and daily weights - Follow-up echocardiogram -  losartan  and  spironolactone   Essential hypertension Blood pressures initially elevated up to 174/89. - Continue losartan   Hyperlipidemia - Resume atorvastatin   History of LV thrombus - Continue Xarelto   Cocaine abuse Patient was positive for cocaine which he reports last using 2 to 3 days ago. - Continue to counsel need of cessation of cocaine use  Tobacco abuse Patient still reports smoking a couple cigarettes/day on average.  DVT prophylaxis: Xarelto  Advance Care Planning:   Code Status: Full Code  Consults: None  Family Communication: None  Severity of Illness: The appropriate patient status for this patient is OBSERVATION. Observation status is judged to be reasonable and necessary in order to provide the required intensity of service to ensure the patient's safety. The patient's presenting symptoms, physical exam findings, and initial radiographic and laboratory data in the context of their medical condition is felt to place them at decreased risk for further clinical deterioration. Furthermore, it is anticipated that the patient will be medically stable for discharge from the hospital within 2 midnights of admission.   Author: Maximino DELENA Sharps, MD 12/24/2023 8:01 AM  For on call review [email or web address].

## 2023-12-25 ENCOUNTER — Other Ambulatory Visit (HOSPITAL_COMMUNITY): Payer: Self-pay

## 2023-12-25 ENCOUNTER — Encounter (HOSPITAL_COMMUNITY): Payer: Self-pay | Admitting: Internal Medicine

## 2023-12-25 ENCOUNTER — Telehealth (HOSPITAL_COMMUNITY): Payer: Self-pay | Admitting: Pharmacy Technician

## 2023-12-25 DIAGNOSIS — R001 Bradycardia, unspecified: Secondary | ICD-10-CM | POA: Diagnosis not present

## 2023-12-25 DIAGNOSIS — R9431 Abnormal electrocardiogram [ECG] [EKG]: Secondary | ICD-10-CM

## 2023-12-25 DIAGNOSIS — R0789 Other chest pain: Secondary | ICD-10-CM | POA: Diagnosis not present

## 2023-12-25 DIAGNOSIS — I10 Essential (primary) hypertension: Secondary | ICD-10-CM | POA: Diagnosis not present

## 2023-12-25 LAB — BASIC METABOLIC PANEL WITH GFR
Anion gap: 12 (ref 5–15)
BUN: 22 mg/dL (ref 8–23)
CO2: 26 mmol/L (ref 22–32)
Calcium: 8.9 mg/dL (ref 8.9–10.3)
Chloride: 101 mmol/L (ref 98–111)
Creatinine, Ser: 1.32 mg/dL — ABNORMAL HIGH (ref 0.61–1.24)
GFR, Estimated: >60 mL/min (ref >60)
Glucose, Bld: 109 mg/dL — ABNORMAL HIGH (ref 70–99)
Potassium: 3.5 mmol/L (ref 3.5–5.1)
Sodium: 139 mmol/L (ref 135–145)

## 2023-12-25 LAB — CBC
HCT: 50.8 % (ref 39.0–52.0)
Hemoglobin: 16.2 g/dL (ref 13.0–17.0)
MCH: 28.3 pg (ref 26.0–34.0)
MCHC: 31.9 g/dL (ref 30.0–36.0)
MCV: 88.7 fL (ref 80.0–100.0)
Platelets: 243 K/uL (ref 150–400)
RBC: 5.73 MIL/uL (ref 4.22–5.81)
RDW: 14.6 % (ref 11.5–15.5)
WBC: 5.9 K/uL (ref 4.0–10.5)
nRBC: 0 % (ref 0.0–0.2)

## 2023-12-25 MED ORDER — VITAMIN D3 25 MCG (1000 UNIT) PO TABS
5000.0000 [IU] | ORAL_TABLET | Freq: Every day | ORAL | 0 refills | Status: AC
  Filled 2023-12-25: qty 150, 30d supply, fill #0

## 2023-12-25 MED ORDER — EMPAGLIFLOZIN 10 MG PO TABS
10.0000 mg | ORAL_TABLET | Freq: Every day | ORAL | 0 refills | Status: DC
  Filled 2023-12-25: qty 30, 30d supply, fill #0

## 2023-12-25 MED ORDER — ASPIRIN 81 MG PO TBEC
81.0000 mg | DELAYED_RELEASE_TABLET | Freq: Every day | ORAL | 0 refills | Status: DC
  Filled 2023-12-25: qty 60, 60d supply, fill #0

## 2023-12-25 MED ORDER — GABAPENTIN 400 MG PO CAPS
400.0000 mg | ORAL_CAPSULE | Freq: Every day | ORAL | 0 refills | Status: DC | PRN
  Filled 2023-12-25: qty 90, 90d supply, fill #0

## 2023-12-25 MED ORDER — CITALOPRAM HYDROBROMIDE 20 MG PO TABS
20.0000 mg | ORAL_TABLET | Freq: Every day | ORAL | 0 refills | Status: DC
  Filled 2023-12-25: qty 90, 90d supply, fill #0

## 2023-12-25 MED ORDER — ATORVASTATIN CALCIUM 80 MG PO TABS
80.0000 mg | ORAL_TABLET | Freq: Every day | ORAL | 0 refills | Status: DC
  Filled 2023-12-25: qty 30, 30d supply, fill #0

## 2023-12-25 MED ORDER — SPIRONOLACTONE 25 MG PO TABS
12.5000 mg | ORAL_TABLET | Freq: Every day | ORAL | 0 refills | Status: DC
  Filled 2023-12-25: qty 45, 90d supply, fill #0

## 2023-12-25 MED ORDER — ARIPIPRAZOLE 10 MG PO TABS
10.0000 mg | ORAL_TABLET | Freq: Every day | ORAL | 0 refills | Status: DC
  Filled 2023-12-25: qty 30, 30d supply, fill #0

## 2023-12-25 MED ORDER — TORSEMIDE 20 MG PO TABS
20.0000 mg | ORAL_TABLET | Freq: Every day | ORAL | 0 refills | Status: DC
  Filled 2023-12-25: qty 90, 90d supply, fill #0

## 2023-12-25 MED ORDER — ALBUTEROL SULFATE HFA 108 (90 BASE) MCG/ACT IN AERS
1.0000 | INHALATION_SPRAY | Freq: Four times a day (QID) | RESPIRATORY_TRACT | 0 refills | Status: DC | PRN
  Filled 2023-12-25: qty 18, 25d supply, fill #0

## 2023-12-25 MED ORDER — LOSARTAN POTASSIUM 25 MG PO TABS
25.0000 mg | ORAL_TABLET | Freq: Every day | ORAL | 0 refills | Status: DC
  Filled 2023-12-25: qty 30, 30d supply, fill #0

## 2023-12-25 MED ORDER — DIGOXIN 125 MCG PO TABS
0.1250 mg | ORAL_TABLET | Freq: Every day | ORAL | 1 refills | Status: DC
  Filled 2023-12-25: qty 30, 30d supply, fill #0

## 2023-12-25 MED ORDER — RIVAROXABAN 20 MG PO TABS
20.0000 mg | ORAL_TABLET | Freq: Every day | ORAL | 0 refills | Status: DC
  Filled 2023-12-25: qty 30, 30d supply, fill #0

## 2023-12-25 NOTE — Progress Notes (Signed)
 DISCHARGE NOTE HOME Allen Mayer to be discharged Home per MD order. Discussed prescriptions and follow up appointments with the patient. Prescriptions given to patient; medication list explained in detail. Patient verbalized understanding.  Skin clean, dry and intact without evidence of skin break down, no evidence of skin tears noted. IV catheter discontinued intact. Site without signs and symptoms of complications. Dressing and pressure applied. Pt denies pain at the site currently. No complaints noted.  Patient free of lines, drains, and wounds.   An After Visit Summary (AVS) was printed and given to the patient.  Pharmacy still working on [employer] coach has to work on [employer] once pharmacy get [employer] approval  Peyton SHAUNNA Pepper, RN

## 2023-12-25 NOTE — Discharge Instructions (Signed)
 [employer] assistance programs Crisis assistance programs  -[facility] Arts development officer. If you are experiencing homelessness in [place], [place], your first point of contact should be Pensions consultant. You can reach Coordinated Entry by calling (336) (586)[phone] or by emailing [email or web address] [email or web address].  Community access points: [employer] [phone], [place]) every Tuesday from 9am-10am. [facility] ([address] [place], [place]) every Wednesday from 8am-9am.   -[place] Coordinated Re-entry Marcy Panning: Dial 211 and request. Offers referrals to homeless shelters in the area.    -The [employer] 705-857-6775) offers several services to local families, as funding allows. The Emergency Assistance Program (EAP), which they administer, provides household goods, free food, clothing, and financial aid to people in need in the [employer] area. The EAP program does have some qualification, and counselors will interview clients for financial assistance by written referral only. Referrals need to be made by the Department of Social Services or by other EAP approved human services agencies or charities in the area.  -[facility] of [employer], which can be reached at [phone], offers emergency assistance programs for those in need of help, such as food, rent assistance, a soup kitchen, shelter, and clothing. They are based in [facility] but provide a number of services to those that qualify for assistance.   [facility] Department of Social Services may be able to offer temporary financial assistance and cash grants for paying rent and utilities, Help may be provided for local county residents who may be experiencing personal crisis when other resources, including government programs, are not available. Call [phone]  -High [employer] is a [employer] agency, The organization can offer emergency assistance for paying rent, Care[employer] Rx, utilities, food, household products and furniture. They offer extensive emergency and transitional housing for families, children and single women, and also run a Boy's and [employer]. Thrift Shops, Secondary school teacher, and other aid offered too. [address] [place], [place] [address], [phone]  -[place] Low Income Energy Assistance Program -- This is offered for [facility] families. The federal government created [employer] Program provides a one-time cash grant payment to help eligible low-income families pay their electric and heating bills. [address] [place], [place] [address], ([phone]  -[place] Emergency Assistance -- A program offers emergency utility and rent funds for greater [employer] area residents. The program can also provide counseling and referrals to charities and government programs. Also provides food and a free meal program that serves lunch Mondays - Saturdays and dinner seven days per week to individuals in the community. [address], [place], [place] [address], [phone]  -[employer] - Offers affordable apartment and housing communities across      [place] and [place]. The low income and seniors can access public housing, rental assistance to qualified applicants, and apply for the section 8 rent subsidy program. Other programs include Chiropractor and Engineer, maintenance. [address] [place], [place] [address], dial [phone].  -The [facility] provides transitional housing to veterans and the disabled. Clients will also access other services too, including assistance in applying for Disability, life skills classes, case management, and assistance in finding permanent housing. [address] [place], [place]  [place] [address], call [phone]  -[place] Transitional Housing through [facility] is for people who were just  evicted or that are formerly homeless. The non-profit will also help then gain self-sufficiency, find a home or apartment to live in, and also provides information on rent assistance when needed. Phone [phone]  -The [place] [facility] [facility] helps low income, elderly, or disabled residents in seven counties in the [place] ([place], [place], [place], [place], [place], [place], [place], and [place]) save energy and reduce their utility bills by improving energy efficiency. Phone [phone].  -[employer] is located in the [place] [facility] in the [employer], [address], [place], [place] [address]. Parking is in the rear of the building. Phone: [phone]   General Email: info@gsohc .org  GHC provides free housing counseling assistance in locating affordable rental housing or housing with support services for families and individuals in crisis and the chronically homeless. We provide potential resources for other housing needs like utilities. Our trained counselors also work with clients on budgeting and financial literacy in effort to empower them to take control of their financial situations. [employer] collaborates with homeless service providers and other stakeholders as part of the [employer] COC (Continuum of Care). The (COC) is a regional/local planning body that coordinates housing and services funding for homeless families and individuals. The role of [facility] in the COC is through housing counseling to work with people we serve on diversion strategies for those that are at imminent risk of becoming homeless. We also work with the Coordinated Assessment/Entry Specialist who attempts to find temporary solutions and/or connects the people  to Housing First, Rapid Re-housing or transitional housing programs. Our Homelessness Prevention Housing Counselors meet with clients on business days (Monday-Fridays, except scheduled holidays) from 8:30 am to 4:30 pm.  Legal assistance for evictions, foreclosure, and more -If you need free legal advice on civil issues, such as foreclosures, evictions, Electronics engineer, government programs, domestic issues and more, Armed forces operational officer Aid of [facility]) is a Associate Professor firm that provides free legal services and counsel to lower income people, seniors, disabled, and others, The goal is to ensure everyone has access to justice and fair representation. Call them at [phone].  [facility] for Housing and Community Studies can provide info about obtaining legal assistance with evictions. Phone [phone].  Data processing manager  The [employer], [employer]. offers job and Dispensing optician. Resources are focused on helping students obtain the skills and experiences that are necessary to compete in today's challenging and tight job market. The non-profit faith-based community action agency offers internship trainings as well as classroom instruction. Classes are tailored to meet the needs of people in the [facility] region. [place], [place] [address], [phone]  Foreclosure prevention/Debt Services Family Services of the [employer] Credit Counseling Service inludes debt and foreclosure prevention programs for local families. This includes money management, financial advice, budget review and development of a written action plan with a Pensions consultant to help solve specific individual financial problems. In addition, housing and mortgage counselors can also provide pre- and post-purchase homeownership counseling, default resolution counseling (to prevent foreclosure) and reverse mortgage counseling. A Debt Management Program allows  people and families with a high level of credit card or medical debt to consolidate and repay consumer debt and loans to creditors and rebuild positive credit ratings and scores. Contact (336) Q4373065.  Community clinics in [place] -Health Department [facility]: [address] [place], [place], [address]. [phone].  -Health Department [place] [facility]: [phone], [facility] [place], [address]. [phone].  -[facility] offers medical care through a group of doctors, pharmacies and other healthcare related agencies that offer services for low income, uninsured adults in [place]. Also offers adult Dental care and assistance with applying for an [employer]. Call [phone].   [facility]. This center provides low-cost health care to those without health insurance. Services offered include an onsite pharmacy. Phone [phone]. [address], [address], [place].  -Medication Assistance Program serves as a link between pharmaceutical companies and patients to provide low cost or free prescription medications. This service is available for residents who meet certain income restrictions and have no insurance coverage. PLEASE CALL [phone] Ginette Otto) OR [phone] ([place])  -One Step Further: Materials engineer, The [employer] Support & Nutrition Program, [employer]. Call [phone]/ [phone].  Food pantry and assistance -[facility]ban [facility]-Food Bank: [address], [place] [address]. Phone [phone]  -[facility] Food Pantry: [address] [place], [place] [address]. 8435970702.  -Missionary Ministry: has the purpose of visiting the sick and shut-ins and provide for needs in the surrounding communities. Call [phone]. Email: [email or web address] .[email or web address] This program provides: Food box for seniors, Financial assistance, Food to meet basic  nutritional needs.  -Meals on Wheels with Senior Resources: [facility] residents age 65 and over who are homebound and unable to obtain and prepare a nutritious meal for themselves are eligible for this service. There may be a waiting list in certain parts of [facility] if the route in that area is full. If you are in [facility] and [place] call ([phone] to register. For all other areas call [phone] to register.  -Greater Dietitian: https://findfood.BargainContractor.si  TRANSPORTATION: -[employer] Department of Health: Call [facility] and [employer] at [phone] for details. AttractionGuides.es  -Access [place]: Access [place] is the [employer] Agency's shared-ride transportation service for eligible riders who have a disability that prevents them from riding the fixed route bus. Call ([phone]. Access [place] riders must pay a fare of $1.50 per trip, or may purchase a 10-ride punch card for $14.00 ($1.40 per ride) or a 40-ride punch card for $48.00 ($1.20 per ride).  -The [facility]'s WHEELS rideshare transportation service is provided for senior citizens (60+) who live independently within [place] city limits and are unable to drive or have limited access to transportation. Call [phone] to schedule an appointment.  -[place] Transportation: For Medicare or Medicaid recipients call [phone]?[facility], wheelchair Zenaida Niece, and ambulatory quotes available.   FLEEING VIOLENCE: -Family Services of the [place]- 24/7 Crisis line (520)297-1755) -[facility]: (336) 641-SAFE [phone])  [place] 2-1-1 is another useful way to locate resources in the community. Visit ShedSizes.ch to find service information online. If you need additional assistance, 2-1-1 Referral Specialists are available 24 hours a day, every day by dialing  2-1-1 or [phone] from any phone. The call is free, confidential, and available in any language.  Affordable Housing Search http://www.nchousingsearch.org  [facility])   M-F 8a-3p [address] [place], [place] [address] [phone] Services include: laundry, barbering, support groups, case management, phone & computer access, showers, AA/NA mtgs, mental health/substance abuse nurse, job skills class, disability information, VA assistance, spiritual classes, etc. Winter Shelter available when temperatures are less than 32 degrees.   HOMELESS SHELTERS [facility] at [facility]- Call [phone] ext. 347  or ext. 336. Located at [address], [place], [place] [address]  [facility] Mens Shelter- Call [phone]. Located at [address] [place] [address].  [facility]- [employer]. Call [phone]. Office located at [address] [place] [address].  [facility] Family Housing through [place] [phone].  [facility]- Call [phone]. Located at [address] [place], [place], [place] [address].  [facility]-For Pregnant mothers. Call [phone]. Located at [address] [place], [address].  [facility]-For men in [place]. Call [phone]. [facility]-Shelter in [place]. Call [phone].  Home of [employer] for [employer] 7315740023. Office located at [address] [place], [address].  [employer] be agreeable to help with chores. Call [phone] ext. 5000.  Men's: [address] [place],  [address]. Women's: [facility]  [address] [place], [place] [address]  Crisis Services [facility] Mobile Crisis Management- [phone]  [facility] [address], [place], [place] [address]. Phone: [phone]

## 2023-12-25 NOTE — TOC CM/SW Note (Signed)
 Transition of Care [facility]) - Inpatient Brief Assessment   Patient Details  Name: Allen Mayer MRN: [id number] Date of Birth: 03-Aug-1960  Transition [facility]) CM/SW Contact:    Allen Barnie Rama, RN Phone Number: 12/25/2023, 10:08 AM   Clinical Narrative: Homeless, has no PCP and insurance on file, states has no HH services in place at this time or DME.  States he will need a bus pass , states gets medications from [facility] but will need TOC pharmacy to fill for him, he has no funds to pay for meds.  Pta self ambulatory.   He needs housing resources.     Transition of Care Asessment: Insurance and Status: Insurance coverage has been reviewed Patient has primary care physician: No Home environment has been reviewed: homeless Prior level of function:: indep Prior/Current Home Services: No current home services Social Drivers of Health Review: SDOH reviewed needs interventions Readmission risk has been reviewed: Yes Transition of care needs: transition of care needs identified, TOC will continue to follow

## 2023-12-25 NOTE — Progress Notes (Signed)
 Patient will need bus pass to Heart Failure TOC appt

## 2023-12-25 NOTE — Progress Notes (Signed)
 Patient does not have housing after this discharge.  Had been incarcerated then stayed with his x-wife until now.  Does not have housing.

## 2023-12-25 NOTE — Telephone Encounter (Signed)
 Pharmacy Patient Advocate Encounter  Received notification from [facility] MEDICAID that Prior Authorization for Jardiance  10 mg tablets has been APPROVED from 12/25/2023 to 12/24/2024   PA #/Case ID/Reference #: [id number] Confirmation#: [id number] W

## 2023-12-25 NOTE — Progress Notes (Signed)
 Heart Failure Nurse Navigator Progress Note  PCP: Patient, No Pcp Per PCP-Cardiologist: None Admission Diagnosis: Chest Pain Admitted from: Recent Incarceration  Presentation:   Allen Mayer is a 63 y.o. male with combined systolic and diastolic heart failure who presented with soreness in his bilateral lower chest that is worse with deep breaths and with movement. Patient had been non compliant with his medications because he ran out of them 3-4 days prior but had not missed his blood thinner.  Also complains of a productive cough of white phlegm. Denies shortness pf breath, fever, exertional symptoms, diaphoresis, leg swelling, nausea or vomiting, abdominal pain or diarrhea. Yk:Ybezmuzwdpnw, Chronic HFrEF, CKD stage III, COPD, Polysubstance use. UDS + Cocaine. BNP 11.7.  HS-Troponin-19>16. Chest x-ray noted no acute abnormality. He has recently been released from prison.   ECHO/ LVEF: 35-40% 12/24/23  Clinical Course:  Past Medical History:  Diagnosis Date   Acid reflux    Alcohol abuse    Arthritis    Asthma    Back pain    Bronchitis    Chest pain 07/31/2021   CHF (congestive heart failure) (HCC)    CKD (chronic kidney disease)    CKD (chronic kidney disease), stage III (HCC)    Cocaine abuse (HCC)    COPD (chronic obstructive pulmonary disease) (HCC)    History of noncompliance with medical treatment, presenting hazards to health    Homelessness    Hypertension    LV (left ventricular) mural thrombus    Myocardial infarction [facility])    Neuropathic pain    NICM (nonischemic cardiomyopathy) (HCC) 2019   NSTEMI (non-ST elevated myocardial infarction) (HCC) 07/31/2021   NSVT (nonsustained ventricular tachycardia) (HCC)    Pericardial effusion    Pulmonary hypertension (HCC)    RVF (right ventricular failure) (HCC)    Schizo affective schizophrenia (HCC)      Social History   Socioeconomic History   Marital status: Divorced    Spouse name: Not on file   Number of  children: 4   Years of education: Not on file   Highest education level: High school graduate  Occupational History   Occupation: disability  Tobacco Use   Smoking status: Every Day    Current packs/day: 0.50    Types: Cigarettes   Smokeless tobacco: Never  Vaping Use   Vaping status: Never Used  Substance and Sexual Activity   Alcohol use: Yes    Comment: occasionally   Drug use: Yes    Types: Cocaine   Sexual activity: Not on file  Other Topics Concern   Not on file  Social History Narrative   ** Merged History Encounter **       Lives with fiance.     Social Drivers of Corporate investment banker Strain: High Risk (12/25/2023)   Overall Financial Resource Strain (CARDIA)    Difficulty of Paying Living Expenses: Very hard  Food Insecurity: No Food Insecurity (12/25/2023)   Hunger Vital Sign    Worried About Running Out of Food in the Last Year: Never true    Ran Out of Food in the Last Year: Never true  Transportation Needs: No Transportation Needs (12/25/2023)   PRAPARE - Administrator, Civil Service (Medical): No    Lack of Transportation (Non-Medical): No  Physical Activity: Not on File (07/29/2021)   [facility]   Physical Activity    Physical Activity: 0  Stress: Not on File (07/29/2021)   [facility]  Stress    Stress: 0  Social Connections: Not on File (12/24/2022)   Received from [employer]    Connectedness: 0   Education Assessment and Provision:  Detailed education and instructions provided on heart failure disease management including the following:  Signs and symptoms of Heart Failure When to call the physician Importance of daily weights Low sodium diet Fluid restriction Medication management Anticipated future follow-up appointments  Patient education given on each of the above topics.  Patient acknowledges understanding via teach back method and acceptance of all instructions.  Education  Materials:  Living Better With Heart Failure Booklet, HF zone tool, & Daily Weight Tracker Tool.  Patient has scale at home: No.  Patient is unsure of his housing location at time of discharge.  At Heart Failure TOC reassess patient need for scale at that time. Patient has pill box at home: Medications to be provided to patient at the bedside for discharge.    High Risk Criteria for Readmission and/or Poor Patient Outcomes: Heart failure hospital admissions (last 6 months): 1  No Show rate: 24% Difficult social situation: Homelessness Demonstrates medication adherence: Yes Primary Language: English Literacy level: Reading, Writing & Comprehension  Barriers of Care:   Homelessness, Polysubstance Use, Transportation, Financial Resource Strain  Considerations/Referrals:  Referral made to Heart Failure Pharmacist Stewardship: N/A Referral made to Heart Failure CSW/NCM TOC: Yes. Referral made to Heart & Vascular TOC clinic: [facility] 12/27/2023 @ 9:45.  Scheduled in 2 days from discharge due to patient SDOH needs.  Patient said he will take the bus to the appointment.SW provided him with bus pass.   Items for Follow-up on DC/TOC: Daily Weights Diet & Fluid Restrictions Polysubstance Use Cessation Compliance with HF Medications Continued Heart Failure Education  Reassess scale and pill box needs at [facility] appointment  Charmaine Pines, RN, BSN [facility] Heart Failure Navigator Secure Chat Only

## 2023-12-25 NOTE — Discharge Summary (Addendum)
 Physician Discharge Summary   Patient: Allen Mayer MRN: [id number] DOB: [birth year]  Admit date:     12/23/2023  Discharge date: 12/25/23  Discharge Physician: Isaiah Lever   [facility] [place] but does not have consistent transportation to make appointments  Recommendations at discharge:   Patient will be given refills on all of his home medications upon discharge.  He will receive a 90-day supply through our transitions of care pharmacy. [facility] team has been notified that patient needs to reestablish for care Have asked transitions of care to assist with helping patient establish with a PCP in [facility] due to lack of [facility] [place] Incidental finding of new 5 mm nodule in the superior segment of the right lower lobe.  Patient has a history of smoking and needs follow-up CT of the chest without contrast in 12 months. Patient remains homeless and this is impacting his ability to attend follow-up appointments and at times adhere to medication regimen.  Discharge Diagnoses: Principal Problem:   Chest pain Active Problems:   Tobacco abuse   Essential hypertension, benign   LV (left ventricular) mural thrombus   Cocaine abuse (HCC)   Heart failure with reduced ejection fraction (HCC)   Hyperlipidemia   Sinus bradycardia   Hospital Course: 63 year old homeless male with history of COPD, hypertension, chronic HFrEF with last known EF 10 to 20% without evidence of LV thrombus, CKD stage III, tobacco abuse and polysubstance abuse.  Patient reported apparent chest pain with rib cage discomfort for 3 to 4 days bilaterally and exacerbated by breathing and movement.  No recent falls or injuries.  He did use his albuterol  inhaler several times without improvement.  Attempted to use cocaine in an effort to alleviate the pain.  Patient recently incarcerated in prison for 20 months and did not have any refills on his  chronic medications.  In the ED his vital signs were stable and he had intermittent borderline sinus bradycardia and sinus rhythm.  Troponin minimally elevated with flat trend.  BNP was normal.  Chest x-ray unremarkable, CT of the chest without PE.  No evidence of viral infection.  Urine drug screen was positive for cocaine.  Incidental finding of 5 mm nodule in the superior segment of the right lower lobe with an unchanged 0.9 cm broad-based pleural nodule overlying the posterior medial left upper lobe.  Since admission patient's chest pain has resolved.  Symptoms and workup not consistent with cardiac chest pain.  Echocardiogram has been completed that revealed significant improvement in EF now 35 to 40% with grade 1 diastolic dysfunction.  It is suspected this improvement has been a result of patient being compliant with medications while incarcerated.  Potassium was 3.5 and creatinine 1.32 on date of admission.  Assessment and Plan: Atypical chest pain Pleuritic in nature Not consistent with ischemic cardiac pain noting EKG essentially unremarkable and no acute ischemic changes, troponin essentially normal with flat trend Suspect a degree of mild COPD exacerbation contributing noting patient is noncompliant with his long-acting medication  HFrEF 2/2 NICM Previous echo in 2023 revealed an EF of 20% with severe reduction in RV function i.e. biventricular failure Follow-up echo this admission shows significant improvement in EF now 35 to 45% with resolution of RV systolic dysfunction GDMT: Continue Cozaar , Jardiance , Aldactone  and Demadex -hopefully patient will remain compliant with these medications now that he is out of prison Given patient on ARB and Aldactone  will not utilize potassium supplementation Have  contacted cardiology in regards to assisting patient to reestablish with the heart failure clinic for outpatient follow-up Previously had been placed on Xarelto  given extremely low EF and risk  for subsequent development of LV thrombus.  Will continue for now given concerns over patient will likely return to noncompliant behaviors regarding cardiac medications which would potentially lead to reduction in EF over time.  Final decision about discontinuing Xarelto  will be left to the heart failure clinic.  Hypertension Current blood pressure control See above medications under heart failure  COPD Patient presented with pleuritic chest discomfort that did not improve with short acting albuterol  x 3 puffs. Patient attempted to treat pain with cocaine which likely only worsened COPD type symptoms Did not receive nebulizer treatments here and pain has completely resolved Patient admits to nonadherence to fluticasone  centimeter all as well as Incruse inhaler stating they make my breathing worse.  Extensive discussion with patient regarding need for compliance with these medications since these are maintenance medications that are to be used in conjunction with rescue inhaler albuterol . Smoking cessation discussed  CKD stage III Creatinine at baseline  Polysubstance abuse Urine drug screen was positive for cocaine at presentation; states last use was about 3 to 4 days ago while he was trying to treat his pleuritic chest pain Counseled regarding absolute cessation especially with underlying cardiac problem Also continues to smoke.  Unclear if continuing to use alcoholic beverages  Chronic neuropathic pain Continue Neurontin  as needed which patient states is helpful  History of nonadherence with medical therapy Congratulated patient on improvement in EF and stated this was only possible because he was adherent with his medications while in prison and stressed the importance of continued medication adherence now that he is out of prison  Schizoaffective disorder Apparently had noncompliant with medications in the past Resumed Abilify         Consultants: None Procedures performed:  2D echocardiogram Disposition: Homeless Diet recommendation:  Cardiac diet   DISCHARGE MEDICATION: Allergies as of 12/25/2023       Reactions   Flexeril  [cyclobenzaprine ] Other (See Comments)   Caused cramping and patient did not like the way it made it feel. Requests this medication to NOT be given to him.        Medication List     STOP taking these medications    benzonatate  100 MG capsule Commonly known as: TESSALON    benztropine  1 MG tablet Commonly known as: COGENTIN    Mucus Relief DM 30-600 MG Tb12   nitroGLYCERIN  0.4 MG SL tablet Commonly known as: NITROSTAT    pantoprazole  40 MG tablet Commonly known as: PROTONIX        TAKE these medications    albuterol  108 (90 Base) MCG/ACT inhaler Commonly known as: VENTOLIN  HFA Inhale 1-2 puffs into the lungs every 6 (six) hours as needed for wheezing or shortness of breath.   ARIPiprazole  10 MG tablet Commonly known as: ABILIFY  Take 1 tablet (10 mg total) by mouth daily.   aspirin  EC 81 MG tablet Take 1 tablet (81 mg total) by mouth daily. Swallow whole.   atorvastatin  80 MG tablet Commonly known as: LIPITOR  Take 1 tablet (80 mg total) by mouth daily.   cholecalciferol  25 MCG (1000 UNIT) tablet Commonly known as: VITAMIN D3 Take 5 tablets (5,000 Units total) by mouth daily.   citalopram  20 MG tablet Commonly known as: CELEXA  Take 1 tablet (20 mg total) by mouth daily.   digoxin  0.125 MG tablet Commonly known as: LANOXIN  Take 1 tablet (0.125  mg total) by mouth daily.   Dulera  200-5 MCG/ACT Aero Generic drug: mometasone -formoterol  Inhale 2 puffs into the lungs 2 (two) times daily.   empagliflozin  10 MG Tabs tablet Commonly known as: JARDIANCE  Take 1 tablet (10 mg total) by mouth daily.   gabapentin  400 MG capsule Commonly known as: NEURONTIN  Take 1 capsule (400 mg total) by mouth daily as needed (nerve pain).   Incruse Ellipta  62.5 MCG/ACT Aepb Generic drug: umeclidinium bromide  Inhale 1 puff  into the lungs daily.   losartan  25 MG tablet Commonly known as: COZAAR  Take 1 tablet (25 mg total) by mouth daily.   rivaroxaban  20 MG Tabs tablet Commonly known as: XARELTO  Take 1 tablet (20 mg total) by mouth daily with supper.   spironolactone  25 MG tablet Commonly known as: ALDACTONE  Take 0.5 tablets (12.5 mg total) by mouth daily. Start taking on: December 26, 2023   torsemide  20 MG tablet Commonly known as: DEMADEX  Take 1 tablet (20 mg total) by mouth daily.        Discharge Exam: Filed Weights   12/23/23 2240 12/24/23 1444 12/25/23 0354  Weight: 71 kg 91.2 kg 90.4 kg   Constitutional: NAD, calm, comfortable Respiratory: clear but somewhat coarse to auscultation bilaterally, no wheezing, no crackles. Normal respiratory effort. No accessory muscle use.  Cardiovascular: Regular rate and rhythm, no murmurs / rubs / gallops. No extremity edema. 2+ pedal pulses.  Abdomen: no tenderness, no masses palpated. No hepatosplenomegaly. Bowel sounds positive.  Musculoskeletal: no clubbing / cyanosis. No joint deformity upper and lower extremities. Good ROM, no contractures. Normal muscle tone.  Skin: no rashes, lesions, ulcers. No induration Neurologic: CN 2-12 grossly intact. Sensation intact, Strength 5/5 x all 4 extremities.  Psychiatric: Normal judgment and insight. Alert and oriented x 3. Normal mood.    Condition at discharge: stable  The results of significant diagnostics from this hospitalization (including imaging, microbiology, ancillary and laboratory) are listed below for reference.   Imaging Studies: ECHOCARDIOGRAM COMPLETE Result Date: 12/24/2023    ECHOCARDIOGRAM REPORT   Patient Name:   DAYVIN ABER Date of Exam: 12/24/2023 Medical Rec #:  [id number]        Height:       72.0 in Accession #:    [id number]       Weight:       156.5 lb Date of Birth:  06-01-1960       BSA:          1.920 m Patient Age:    62 years         BP:           151/88 mmHg Patient  Gender: M                HR:           71 bpm. Exam Location:  Inpatient Procedure: 2D Echo, Cardiac Doppler and Color Doppler (Both Spectral and Color            Flow Doppler were utilized during procedure). Indications:    Chest Pain  History:        Patient has prior history of Echocardiogram examinations, most                 recent 11/02/2021. COPD, Signs/Symptoms:Chest Pain; Risk                 Factors:Current Smoker, Hypertension and Dyslipidemia.  Sonographer:    Philomena Daring Referring Phys: [id number] RONDELL A SMITH IMPRESSIONS  1.  Left ventricular ejection fraction, by estimation, is 35 to 40%. The left ventricle has moderately decreased function. The left ventricle demonstrates global hypokinesis. There is mild left ventricular hypertrophy. Left ventricular diastolic parameters are consistent with Grade I diastolic dysfunction (impaired relaxation).  2. Right ventricular systolic function is normal. The right ventricular size is normal. Tricuspid regurgitation signal is inadequate for assessing PA pressure.  3. The mitral valve is normal in structure. Trivial mitral valve regurgitation.  4. The aortic valve was not well visualized. Aortic valve regurgitation is not visualized. No aortic stenosis is present.  5. The inferior vena cava is normal in size with greater than 50% respiratory variability, suggesting right atrial pressure of 3 mmHg. Comparison(s): Compared to prior echo, LV systolic function has improved. FINDINGS  Left Ventricle: Left ventricular ejection fraction, by estimation, is 35 to 40%. The left ventricle has moderately decreased function. The left ventricle demonstrates global hypokinesis. The left ventricular internal cavity size was normal in size. There is mild left ventricular hypertrophy. Left ventricular diastolic parameters are consistent with Grade I diastolic dysfunction (impaired relaxation). Right Ventricle: The right ventricular size is normal. No increase in right ventricular  wall thickness. Right ventricular systolic function is normal. Tricuspid regurgitation signal is inadequate for assessing PA pressure. Left Atrium: Left atrial size was normal in size. Right Atrium: Right atrial size was normal in size. Pericardium: There is no evidence of pericardial effusion. Mitral Valve: The mitral valve is normal in structure. Trivial mitral valve regurgitation. Tricuspid Valve: The tricuspid valve is normal in structure. Tricuspid valve regurgitation is trivial. Aortic Valve: The aortic valve was not well visualized. Aortic valve regurgitation is not visualized. No aortic stenosis is present. Pulmonic Valve: The pulmonic valve was not well visualized. Pulmonic valve regurgitation is trivial. Aorta: The aortic root and ascending aorta are structurally normal, with no evidence of dilitation. Venous: The inferior vena cava is normal in size with greater than 50% respiratory variability, suggesting right atrial pressure of 3 mmHg. IAS/Shunts: The interatrial septum was not well visualized.  LEFT VENTRICLE PLAX 2D LVIDd:         5.30 cm   Diastology LVIDs:         4.10 cm   LV e' medial:    6.31 cm/s LV PW:         1.20 cm   LV E/e' medial:  8.0 LV IVS:        1.20 cm   LV e' lateral:   7.51 cm/s LVOT diam:     2.20 cm   LV E/e' lateral: 6.7 LV SV:         54 LV SV Index:   28 LVOT Area:     3.80 cm  RIGHT VENTRICLE             IVC RV S prime:     12.20 cm/s  IVC diam: 1.00 cm TAPSE (M-mode): 2.0 cm LEFT ATRIUM             Index        RIGHT ATRIUM           Index LA diam:        3.80 cm 1.98 cm/m   RA Area:     13.30 cm LA Vol (A2C):   32.9 ml 17.14 ml/m  RA Volume:   28.40 ml  14.79 ml/m LA Vol (A4C):   26.1 ml 13.60 ml/m LA Biplane Vol: 30.1 ml 15.68 ml/m  AORTIC VALVE LVOT Vmax:  91.20 cm/s LVOT Vmean:  57.100 cm/s LVOT VTI:    0.142 m  AORTA Ao Root diam: 2.60 cm Ao Asc diam:  3.40 cm MITRAL VALVE MV Area (PHT): 2.91 cm    SHUNTS MV Decel Time: 261 msec    Systemic VTI:  0.14 m MV E  velocity: 50.30 cm/s  Systemic Diam: 2.20 cm MV A velocity: 68.10 cm/s MV E/A ratio:  0.74 Lonni Nanas MD Electronically signed by Lonni Nanas MD Signature Date/Time: 12/24/2023/2:41:37 PM    Final    CT Angio Chest PE W and/or Wo Contrast Result Date: 12/24/2023 EXAM: CTA of the Chest with contrast for PE 12/24/2023 05:29:18 AM TECHNIQUE: CTA of the chest was performed after the administration of intravenous contrast. Multiplanar reformatted images are provided for review. MIP images are provided for review. Automated exposure control, iterative reconstruction, and/or weight based adjustment of the mA/kV was utilized to reduce the radiation dose to as low as reasonably achievable. COMPARISON: 05/30/2017 CLINICAL HISTORY: Pulmonary embolism (PE) suspected, high prob. Cp, leg swelling FINDINGS: PULMONARY ARTERIES: Pulmonary arteries are adequately opacified for evaluation. No pulmonary embolism. Main pulmonary artery is normal in caliber. MEDIASTINUM: The heart and pericardium demonstrate no acute abnormality. There is no acute abnormality of the thoracic aorta. Aortic atherosclerotic calcification and coronary artery calcification. LYMPH NODES: No mediastinal, hilar or axillary lymphadenopathy. LUNGS AND PLEURA: Band like area of scarring noted within the lingula. Nodule within the superior segment of the right lower lobe is new from previous exam measuring 5 mm, image 51/6. Unchanged, broad-based pleural nodule overlying the posterior medial left upper lobe measuring 0.9 cm, image 44/6. This most likely represents a benign abnormality. UPPER ABDOMEN: Limited images of the upper abdomen are unremarkable. SOFT TISSUES AND BONES: No acute bone or soft tissue abnormality. IMPRESSION: 1. No evidence of pulmonary embolism. 2. New 5 mm nodule in the superior segment of the right lower lobe.In a patient who was at low risk, no further follow-up is indicated. If the patient is at increased risk a  follow-up CT of the chest without contrast material in 12 months may be considered. 3. Unchanged 0.9 cm broad-based pleural nodule overlying the posterior medial left upper lobe, likely benign. Electronically signed by: Waddell Calk MD 12/24/2023 06:08 AM EDT RP Workstation: [id number]   DG Chest 2 View Result Date: 12/23/2023 CLINICAL DATA:  Chest pain and shortness of breath EXAM: CHEST - 2 VIEW COMPARISON:  03/15/2022 FINDINGS: The heart size and mediastinal contours are within normal limits. Both lungs are clear. The visualized skeletal structures are unremarkable. IMPRESSION: No active cardiopulmonary disease. Electronically Signed   By: Oneil Devonshire M.D.   On: 12/23/2023 23:25    Microbiology: Results for orders placed or performed during the hospital encounter of 12/23/23  Resp panel by RT-PCR (RSV, Flu A&B, Covid) Anterior Nasal Swab     Status: None   Collection Time: 12/24/23  4:57 AM   Specimen: Anterior Nasal Swab  Result Value Ref Range Status   SARS Coronavirus 2 by RT PCR NEGATIVE NEGATIVE Final   Influenza A by PCR NEGATIVE NEGATIVE Final   Influenza B by PCR NEGATIVE NEGATIVE Final    Comment: (NOTE) The Xpert Xpress SARS-CoV-2/FLU/RSV plus assay is intended as an aid in the diagnosis of influenza from Nasopharyngeal swab specimens and should not be used as a sole basis for treatment. Nasal washings and aspirates are unacceptable for Xpert Xpress SARS-CoV-2/FLU/RSV testing.  Fact Sheet for Patients: BloggerCourse.com  Fact Sheet for Healthcare  Providers: SeriousBroker.it  This test is not yet approved or cleared by the [place]  FDA and has been authorized for detection and/or diagnosis of SARS-CoV-2 by FDA under an Emergency Use Authorization (EUA). This EUA will remain in effect (meaning this test can be used) for the duration of the COVID-19 declaration under Section 564(b)(1) of the Act, 21 U.S.C. section  360bbb-3(b)(1), unless the authorization is terminated or revoked.     Resp Syncytial Virus by PCR NEGATIVE NEGATIVE Final    Comment: (NOTE) Fact Sheet for Patients: BloggerCourse.com  Fact Sheet for Healthcare Providers: SeriousBroker.it  This test is not yet approved or cleared by the [place]  FDA and has been authorized for detection and/or diagnosis of SARS-CoV-2 by FDA under an Emergency Use Authorization (EUA). This EUA will remain in effect (meaning this test can be used) for the duration of the COVID-19 declaration under Section 564(b)(1) of the Act, 21 U.S.C. section 360bbb-3(b)(1), unless the authorization is terminated or revoked.  Performed at [facility], [address] [place], [place] [address]     Labs: CBC: Recent Labs  Lab 12/23/23 2254 12/23/23 2300 12/25/23 0307  WBC 6.2  --  5.9  HGB 15.1 16.7 16.2  HCT 48.0 49.0 50.8  MCV 90.6  --  88.7  PLT 239  --  243   Basic Metabolic Panel: Recent Labs  Lab 12/23/23 2254 12/23/23 2300 12/25/23 0307  NA 139 141 139  K 4.0 3.9 3.5  CL 103 105 101  CO2 25  --  26  GLUCOSE 100* 100* 109*  BUN 17 20 22   CREATININE 0.98 1.00 1.32*  CALCIUM  9.3  --  8.9   Liver Function Tests: No results for input(s): AST, ALT, ALKPHOS, BILITOT, PROT, ALBUMIN in the last 168 hours. CBG: No results for input(s): GLUCAP in the last 168 hours.  Discharge time spent: less than 30 minutes.  Signed: Isaiah Lever, NP [facility] 12/25/2023

## 2023-12-25 NOTE — TOC Transition Note (Addendum)
 Transition of Care [facility]) - Discharge Note   Patient Details  Name: Allen Mayer MRN: [id number] Date of Birth: 09-Jan-1961  [facility] CM/SW Contact:  [name], RN Phone Number: 12/25/2023, 10:11 AM   Clinical Narrative:    For dc today, will need bus pass per patient and shelter resources. Will need TOC pharmacy to fill medications .  TOC pharmacy will send. [facility] pharmacy will bill patient for the meds.          Patient Goals and CMS Choice            Discharge Placement                       Discharge Plan and Services Additional resources added to the After Visit Summary for                                       Social Drivers of Health (SDOH) Interventions SDOH Screenings   Food Insecurity: No Food Insecurity (12/25/2023)  Housing: Unknown (12/25/2023)  Transportation Needs: No Transportation Needs (12/25/2023)  Utilities: Not At Risk (02/25/2022)  Alcohol Screen: Low Risk  (02/25/2022)  Financial Resource Strain: High Risk (12/25/2023)  Physical Activity: Not on File (07/29/2021)   [facility] from [facility]  Social Connections: Not on File (12/24/2022)   Received from [facility]  Stress: Not on File (07/29/2021)   Received from [facility]  Tobacco Use: High Risk (12/25/2023)     Readmission Risk Interventions    11/04/2021    2:35 PM  Readmission Risk Prevention Plan  Transportation Screening Complete  Medication Review (RN Care Manager) Complete  PCP or Specialist appointment within 3-5 days of discharge Complete  HRI or Home Care Consult Complete  SW Recovery Care/Counseling Consult Complete  Palliative Care Screening Not Applicable  Skilled Nursing Facility Not Applicable

## 2023-12-25 NOTE — TOC CM/SW Note (Signed)
 Per RNCM, patient needing housing resources. CSW provided housing resources and shelter list on patients AVS and RNCM provided paper copy at bedside.   Allen Mayer, MSW, LCSWA Transitions of Care [phone]

## 2023-12-26 ENCOUNTER — Telehealth (HOSPITAL_COMMUNITY): Payer: Self-pay

## 2023-12-26 ENCOUNTER — Emergency Department (HOSPITAL_COMMUNITY)
Admission: EM | Admit: 2023-12-26 | Discharge: 2023-12-27 | Discharge status: Against Medical Advice | Attending: Emergency Medicine | Admitting: Emergency Medicine

## 2023-12-26 ENCOUNTER — Telehealth: Payer: Self-pay

## 2023-12-26 ENCOUNTER — Other Ambulatory Visit: Payer: Self-pay

## 2023-12-26 DIAGNOSIS — Z5321 Procedure and treatment not carried out due to patient leaving prior to being seen by health care provider: Secondary | ICD-10-CM | POA: Insufficient documentation

## 2023-12-26 DIAGNOSIS — R52 Pain, unspecified: Secondary | ICD-10-CM | POA: Diagnosis present

## 2023-12-26 NOTE — ED Triage Notes (Signed)
 Pt was seen yesterday and was not given medicine. Pt having generalized pain.10/10 pain. Pt states he has bone degeneration

## 2023-12-26 NOTE — Progress Notes (Incomplete)
 HEART & VASCULAR TRANSITION OF CARE CONSULT NOTE     Referring Physician: Dr. Davia PCP: Patient, No Pcp Per  Cardiologist: Previously followed with Dr. Edison in 2022  HPI: Referred to clinic by Dr. Davia for heart failure consultation.   Allen Mayer is a 63 y.o. male with history of severe HFrEF, LV thrombi, NSVT, HTN, COPD, CKD 3b, trauma, schizoaffective disorder, and polysubstance abuse.   HF initially diagnosed in 9/19 when EF by echo was found to be 25-30% with G3DD, and mild/mod RV dysfunction. He then underwent R/LHC which showed NICM with elevated LVEDP and severely reduced LV function. After cath patient eloped without signing AMA form.   Since then has had frequent ED visits and admissions for various cardiac/respiratory concerns: shortness of breath, chest pain, edema. In 07/2021, he was admitted for NSTEMI in the setting of cocaine use. TEE at this time showed multiple large LV and mitral thrombi (see below). He underwent repeat R/LHC. He was scheduled for [facility] clinic post hospital, however failed to follow up. Continued to be high ED and utilizer with occasional admissions for ADHF.   Was incarcerated for the last 20 months, without chronic medications.   Admitted to [facility] 9/13 after presenting with chest pain and shortness of breath. He had attempted to use cocaine to allievate pain. Had recent incarceration for 20 months and did not have refills on his medications. CT chest without PE, however incidental finding of pulmonary nodule in RLL. Echo performed this admission (see cardiac testing below).   Today he  presents for transition of care visit. Overall feeling ***. NYHA ***. Reports {Symptoms; cardiac:12860::dyspnea,fatigue}. Denies {Symptoms; cardiac:12860::chest pain,dyspnea,fatigue,near-syncope,orthopnea,palpitations,dizziness,abnormal bleeding}. Able to perform ADLs. Appetite okay. Weight at home ***. BP at home***. Compliant with all  medications. Denies ETOH, tobacco, or drug use.      Cardiac Testing:  TTE 9/25: EF 35-40%, G2DD, nl RV TTE 7/23: EF <20%, gHK, severely reduced RV, mild MR, no LV thrombus [facility] 4/23: mild, nonobstructive CAD. RA 1, pa 30/9 (18), pcw 14, co/ci (FICK) 3.43/1.7 TTE 4/23: large mural LV thrombi at apex and mital leaflet, EF 20-25%, G3DD, low-nl RV function R/LHC 9/19: RA7, PA 52/31 (41), PW 24, CO/CI 5.29/2.44 TTE 9/19: EF 25-30%, G3DD, mild/mod RV dysfunction  Past Medical History:  Diagnosis Date   Acid reflux    Alcohol abuse    Arthritis    Asthma    Back pain    Bronchitis    Chest pain 07/31/2021   CHF (congestive heart failure) (HCC)    CKD (chronic kidney disease)    CKD (chronic kidney disease), stage III (HCC)    Cocaine abuse (HCC)    COPD (chronic obstructive pulmonary disease) (HCC)    History of noncompliance with medical treatment, presenting hazards to health    Homelessness    Hypertension    LV (left ventricular) mural thrombus    Myocardial infarction [facility])    Neuropathic pain    NICM (nonischemic cardiomyopathy) (HCC) 2019   NSTEMI (non-ST elevated myocardial infarction) (HCC) 07/31/2021   NSVT (nonsustained ventricular tachycardia) (HCC)    Pericardial effusion    Pulmonary hypertension (HCC)    RVF (right ventricular failure) (HCC)    Schizo affective schizophrenia (HCC)     Current Outpatient Medications  Medication Sig Dispense Refill   albuterol  (VENTOLIN  HFA) 108 (90 Base) MCG/ACT inhaler Inhale 1-2 puffs into the lungs every 6 (six) hours as needed for wheezing or shortness of breath. 18 g 0  ARIPiprazole  (ABILIFY ) 10 MG tablet Take 1 tablet (10 mg total) by mouth daily. 90 tablet 0   aspirin  EC 81 MG tablet Take 1 tablet (81 mg total) by mouth daily. Swallow whole. 60 tablet 0   atorvastatin  (LIPITOR ) 80 MG tablet Take 1 tablet (80 mg total) by mouth daily. 30 tablet 0   cholecalciferol  (VITAMIN D3) 25 MCG (1000 UNIT) tablet Take 5 tablets  (5,000 Units total) by mouth daily. 150 tablet 0   citalopram  (CELEXA ) 20 MG tablet Take 1 tablet (20 mg total) by mouth daily. 90 tablet 0   digoxin  (LANOXIN ) 0.125 MG tablet Take 1 tablet (0.125 mg total) by mouth daily. 30 tablet 1   empagliflozin  (JARDIANCE ) 10 MG TABS tablet Take 1 tablet (10 mg total) by mouth daily. 60 tablet 0   gabapentin  (NEURONTIN ) 400 MG capsule Take 1 capsule (400 mg total) by mouth daily as needed (nerve pain). 90 capsule 0   losartan  (COZAAR ) 25 MG tablet Take 1 tablet (25 mg total) by mouth daily. 60 tablet 0   mometasone -formoterol  (DULERA ) 200-5 MCG/ACT AERO Inhale 2 puffs into the lungs 2 (two) times daily. 13 g 2   rivaroxaban  (XARELTO ) 20 MG TABS tablet Take 1 tablet (20 mg total) by mouth daily with supper. 60 tablet 0   spironolactone  (ALDACTONE ) 25 MG tablet Take 0.5 tablets (12.5 mg total) by mouth daily. 90 tablet 0   torsemide  (DEMADEX ) 20 MG tablet Take 1 tablet (20 mg total) by mouth daily. 90 tablet 0   umeclidinium bromide  (INCRUSE ELLIPTA ) 62.5 MCG/ACT AEPB Inhale 1 puff into the lungs daily. (Patient not taking: Reported on 12/24/2023) 30 each 0   No current facility-administered medications for this visit.    Allergies  Allergen Reactions   Flexeril  [Cyclobenzaprine ] Other (See Comments)    Caused cramping and patient did not like the way it made it feel. Requests this medication to NOT be given to him.      Social History   Socioeconomic History   Marital status: Divorced    Spouse name: Not on file   Number of children: 4   Years of education: Not on file   Highest education level: High school graduate  Occupational History   Occupation: disability  Tobacco Use   Smoking status: Every Day    Current packs/day: 0.50    Types: Cigarettes   Smokeless tobacco: Never  Vaping Use   Vaping status: Never Used  Substance and Sexual Activity   Alcohol use: Yes    Comment: occasionally   Drug use: Yes    Types: Cocaine   Sexual  activity: Not on file  Other Topics Concern   Not on file  Social History Narrative   ** Merged History Encounter **       Lives with fiance.     Social Drivers of Corporate investment banker Strain: High Risk (12/25/2023)   Overall Financial Resource Strain (CARDIA)    Difficulty of Paying Living Expenses: Very hard  Food Insecurity: No Food Insecurity (12/25/2023)   Hunger Vital Sign    Worried About Running Out of Food in the Last Year: Never true    Ran Out of Food in the Last Year: Never true  Transportation Needs: No Transportation Needs (12/25/2023)   PRAPARE - Administrator, Civil Service (Medical): No    Lack of Transportation (Non-Medical): No  Physical Activity: Not on File (07/29/2021)   Received from [facility]   Physical Activity  Physical Activity: 0  Stress: Not on File (07/29/2021)   Received [facility]   Stress    Stress: 0  Social Connections: Not on File (12/24/2022)   Received from [facility]   Social Connections    Connectedness: 0  Intimate Partner Violence: Not At Risk (12/25/2023)   Humiliation, Afraid, Rape, and Kick questionnaire    Fear of Current or Ex-Partner: No    Emotionally Abused: No    Physically Abused: No    Sexually Abused: No      Family History  Problem Relation Age of Onset   Heart attack Mother        Died age 22   Heart attack Brother        40    There were no vitals filed for this visit.  PHYSICAL EXAM: General: Well appearing. No distress on RA Cardiac: JVP ***. S1 and S2 present. No murmurs or rub. Resp: Lung sounds clear and equal B/L Abdomen: Soft, non-tender, non-distended.  Extremities: Warm and dry.  *** edema.  Neuro: Alert and oriented x3. Affect pleasant. Moves all extremities without difficulty.  ECG (personally reviewed):  ASSESSMENT & PLAN:  NYHA *** Volume *** GDMT  Diuretic BB Ace/ARB/ARNI MRA SGLT2i   Referred to HFSW (PCP, Medications, Transportation, ETOH Abuse, Drug Abuse, Insurance,  Financial ): Yes or No Refer to Pharmacy: Yes or No Refer to Home Health: Yes on No Refer to [facility]: Yes or no  Refer to General Cardiology: Yes or No  Follow up with ***  [place] Antrone Walla, NP 12/26/23

## 2023-12-26 NOTE — Telephone Encounter (Signed)
 Called to confirm/remind patient of their appointment at the [facility] on 12/27/23 9:45.   Appointment:   [x] Confirmed  [] Left mess   [] No answer/No voice mail  [] VM Full/unable to leave message  [] Phone not in service  Patient reminded to bring all medications and/or complete list.  Confirmed patient has transportation. Gave directions, instructed to utilize valet parking.

## 2023-12-26 NOTE — Telephone Encounter (Signed)
 Copied from CRM [id number]. Topic: Appointments - Appointment Scheduling >> Dec 25, 2023 10:43 AM Charolett L wrote: Patient/patient representative is calling to schedule an appointment. Hospital follow up being DISCHARGED 09/15 and as per hospital guidelines he needs to be seen within 14 days of discharge date

## 2023-12-26 NOTE — Telephone Encounter (Signed)
Is not a patient of this office

## 2023-12-27 ENCOUNTER — Encounter (HOSPITAL_COMMUNITY)

## 2023-12-27 NOTE — ED Notes (Signed)
 Patient left AMA.

## 2023-12-29 ENCOUNTER — Encounter (HOSPITAL_COMMUNITY): Payer: Self-pay | Admitting: *Deleted

## 2023-12-29 ENCOUNTER — Emergency Department (HOSPITAL_COMMUNITY)
Admission: EM | Admit: 2023-12-29 | Discharge: 2023-12-30 | Disposition: A | Discharge status: Home / Self Care | Attending: Emergency Medicine | Admitting: Emergency Medicine

## 2023-12-29 ENCOUNTER — Other Ambulatory Visit: Payer: Self-pay

## 2023-12-29 DIAGNOSIS — G8929 Other chronic pain: Secondary | ICD-10-CM | POA: Diagnosis present

## 2023-12-29 DIAGNOSIS — Z7982 Long term (current) use of aspirin: Secondary | ICD-10-CM | POA: Diagnosis not present

## 2023-12-29 DIAGNOSIS — Z7901 Long term (current) use of anticoagulants: Secondary | ICD-10-CM | POA: Insufficient documentation

## 2023-12-29 NOTE — ED Triage Notes (Signed)
 Pt in with generalized body pain, particularly to R side and R hip. Recently discharged and states he has not been able to get all of his medications

## 2023-12-30 LAB — RESP PANEL BY RT-PCR (RSV, FLU A&B, COVID)  RVPGX2
Influenza A by PCR: NEGATIVE
Influenza B by PCR: NEGATIVE
Resp Syncytial Virus by PCR: NEGATIVE
SARS Coronavirus 2 by RT PCR: NEGATIVE

## 2023-12-30 LAB — I-STAT CHEM 8, ED
BUN: 23 mg/dL (ref 8–23)
Calcium, Ion: 1.2 mmol/L (ref 1.15–1.40)
Chloride: 105 mmol/L (ref 98–111)
Creatinine, Ser: 1.2 mg/dL (ref 0.61–1.24)
Glucose, Bld: 126 mg/dL — ABNORMAL HIGH (ref 70–99)
HCT: 47 % (ref 39.0–52.0)
Hemoglobin: 16 g/dL (ref 13.0–17.0)
Potassium: 4.4 mmol/L (ref 3.5–5.1)
Sodium: 142 mmol/L (ref 135–145)
TCO2: 28 mmol/L (ref 22–32)

## 2023-12-30 MED ORDER — MELOXICAM 7.5 MG PO TABS: 7.5000 mg | ORAL_TABLET | Freq: Every day | ORAL | 0 refills | Status: AC

## 2023-12-30 MED ORDER — NAPROXEN 250 MG PO TABS
500.0000 mg | ORAL_TABLET | Freq: Once | ORAL | Status: AC
  Administered 2023-12-30: 500 mg via ORAL
  Filled 2023-12-30: qty 2

## 2023-12-30 MED ORDER — ACETAMINOPHEN 500 MG PO TABS
1000.0000 mg | ORAL_TABLET | Freq: Once | ORAL | Status: AC
  Administered 2023-12-30: 1000 mg via ORAL
  Filled 2023-12-30: qty 2

## 2023-12-30 NOTE — ED Provider Notes (Signed)
 [place] EMERGENCY DEPARTMENT AT [facility] Provider Note   CSN: [id number] Arrival date & time: 12/29/23  2211     Patient presents with: Generalized Body Pain   Allen Mayer is a 63 y.o. male.   The history is provided by the patient.  Muscle Pain This is a chronic problem. The current episode started more than 1 week ago (about 20 years). The problem occurs constantly. The problem has not changed since onset.Pertinent negatives include no chest pain, no abdominal pain, no headaches and no shortness of breath. Nothing aggravates the symptoms. Nothing relieves the symptoms. He has tried nothing for the symptoms. The treatment provided no relief.       Prior to Admission medications   Medication Sig Start Date End Date Taking? Authorizing Provider  albuterol  (VENTOLIN  HFA) 108 (90 Base) MCG/ACT inhaler Inhale 1-2 puffs into the lungs every 6 (six) hours as needed for wheezing or shortness of breath. 12/25/23   Allen Isaiah CROME, NP  ARIPiprazole  (ABILIFY ) 10 MG tablet Take 1 tablet (10 mg total) by mouth daily. 12/25/23   Allen Isaiah CROME, NP  aspirin  EC 81 MG tablet Take 1 tablet (81 mg total) by mouth daily. Swallow whole. 12/25/23 02/23/24  Allen Isaiah CROME, NP  atorvastatin  (LIPITOR ) 80 MG tablet Take 1 tablet (80 mg total) by mouth daily. 12/25/23   Allen Isaiah CROME, NP  cholecalciferol  (VITAMIN D3) 25 MCG (1000 UNIT) tablet Take 5 tablets (5,000 Units total) by mouth daily. 12/25/23 01/24/24  Allen Isaiah CROME, NP  citalopram  (CELEXA ) 20 MG tablet Take 1 tablet (20 mg total) by mouth daily. 12/25/23   Allen Isaiah CROME, NP  digoxin  (LANOXIN ) 0.125 MG tablet Take 1 tablet (0.125 mg total) by mouth daily. 12/25/23 02/23/24  Allen Isaiah CROME, NP  empagliflozin  (JARDIANCE ) 10 MG TABS tablet Take 1 tablet (10 mg total) by mouth daily. 12/25/23 02/23/24  Allen Isaiah CROME, NP  gabapentin  (NEURONTIN ) 400 MG capsule Take 1 capsule (400 mg total) by mouth daily as needed (nerve  pain). 12/25/23   Allen Isaiah CROME, NP  losartan  (COZAAR ) 25 MG tablet Take 1 tablet (25 mg total) by mouth daily. 12/25/23 02/23/24  Allen Isaiah CROME, NP  mometasone -formoterol  (DULERA ) 200-5 MCG/ACT AERO Inhale 2 puffs into the lungs 2 (two) times daily. 02/28/22   Arrien, Allen Daniel, MD  rivaroxaban  (XARELTO ) 20 MG TABS tablet Take 1 tablet (20 mg total) by mouth daily with supper. 12/25/23 02/23/24  Allen Isaiah CROME, NP  spironolactone  (ALDACTONE ) 25 MG tablet Take 0.5 tablets (12.5 mg total) by mouth daily. 12/26/23   Allen Isaiah CROME, NP  torsemide  (DEMADEX ) 20 MG tablet Take 1 tablet (20 mg total) by mouth daily. 12/25/23 03/24/24  Allen Isaiah CROME, NP  umeclidinium bromide  (INCRUSE ELLIPTA ) 62.5 MCG/ACT AEPB Inhale 1 puff into the lungs daily. Patient not taking: Reported on 12/24/2023 01/18/22   Allen Yetta HERO, MD    Allergies: Flexeril  [cyclobenzaprine ]    Review of Systems  Constitutional:  Negative for fever.  Eyes:  Negative for photophobia.  Respiratory:  Negative for shortness of breath.   Cardiovascular:  Negative for chest pain.  Gastrointestinal:  Negative for abdominal pain.  Neurological:  Negative for headaches.  All other systems reviewed and are negative.   Updated Vital Signs BP 119/62 (BP Location: Right Arm)   Pulse 75   Temp 98.1 F (36.7 C)   Resp 18   Ht 6' (1.829 m)   Wt 90.4 kg   SpO2  96%   BMI 27.03 kg/m   Physical Exam Vitals and nursing note reviewed. Exam conducted with a chaperone present.  Constitutional:      General: He is not in acute distress.    Appearance: Normal appearance. He is well-developed. He is not diaphoretic.  HENT:     Head: Normocephalic and atraumatic.     Nose: Nose normal.  Eyes:     Conjunctiva/sclera: Conjunctivae normal.     Pupils: Pupils are equal, round, and reactive to light.  Cardiovascular:     Rate and Rhythm: Normal rate and regular rhythm.     Pulses: Normal pulses.     Heart sounds: Normal heart  sounds.  Pulmonary:     Effort: No respiratory distress.     Breath sounds: Normal breath sounds. No stridor. No wheezing, rhonchi or rales.  Chest:     Chest wall: No tenderness.  Abdominal:     General: Bowel sounds are normal.     Palpations: Abdomen is soft.     Tenderness: There is no abdominal tenderness. There is no guarding or rebound.  Musculoskeletal:        General: No tenderness. Normal range of motion.     Cervical back: Normal range of motion and neck supple.     Right lower leg: No edema.     Left lower leg: No edema.  Skin:    General: Skin is warm and dry.     Capillary Refill: Capillary refill takes less than 2 seconds.  Neurological:     General: No focal deficit present.     Mental Status: He is alert and oriented to person, place, and time.     (all labs ordered are listed, but only abnormal results are displayed) Results for orders placed or performed during the hospital encounter of 12/29/23  I-stat chem 8, ED (not at [facility], [facility] or [facility])   Collection Time: 12/30/23  5:32 AM  Result Value Ref Range   Sodium 142 135 - 145 mmol/L   Potassium 4.4 3.5 - 5.1 mmol/L   Chloride 105 98 - 111 mmol/L   BUN 23 8 - 23 mg/dL   Creatinine, Ser 8.79 0.61 - 1.24 mg/dL   Glucose, Bld 873 (H) 70 - 99 mg/dL   Calcium , Ion 1.20 1.15 - 1.40 mmol/L   TCO2 28 22 - 32 mmol/L   Hemoglobin 16.0 13.0 - 17.0 g/dL   HCT 52.9 60.9 - 47.9 %   ECHOCARDIOGRAM COMPLETE Result Date: 12/24/2023    ECHOCARDIOGRAM REPORT   Patient Name:   Allen Mayer Date of Exam: 12/24/2023 Medical Rec #:  [id number]        Height:       72.0 in Accession #:    [id number]       Weight:       156.5 lb Date of Birth:  1960-06-22       BSA:          1.920 m Patient Age:    63 years         BP:           151/88 mmHg Patient Gender: M                HR:           71 bpm. Exam Location:  Inpatient Procedure: 2D Echo, Cardiac Doppler and Color Doppler (Both Spectral and Color            Flow Doppler were  utilized during procedure). Indications:    Chest Pain  History:        Patient has prior history of Echocardiogram examinations, most                 recent 11/02/2021. COPD, Signs/Symptoms:Chest Pain; Risk                 Factors:Current Smoker, Hypertension and Dyslipidemia.  Sonographer:    Philomena Daring Referring Phys: [id number] RONDELL A SMITH IMPRESSIONS  1. Left ventricular ejection fraction, by estimation, is 35 to 40%. The left ventricle has moderately decreased function. The left ventricle demonstrates global hypokinesis. There is mild left ventricular hypertrophy. Left ventricular diastolic parameters are consistent with Grade I diastolic dysfunction (impaired relaxation).  2. Right ventricular systolic function is normal. The right ventricular size is normal. Tricuspid regurgitation signal is inadequate for assessing PA pressure.  3. The mitral valve is normal in structure. Trivial mitral valve regurgitation.  4. The aortic valve was not well visualized. Aortic valve regurgitation is not visualized. No aortic stenosis is present.  5. The inferior vena cava is normal in size with greater than 50% respiratory variability, suggesting right atrial pressure of 3 mmHg. Comparison(s): Compared to prior echo, LV systolic function has improved. FINDINGS  Left Ventricle: Left ventricular ejection fraction, by estimation, is 35 to 40%. The left ventricle has moderately decreased function. The left ventricle demonstrates global hypokinesis. The left ventricular internal cavity size was normal in size. There is mild left ventricular hypertrophy. Left ventricular diastolic parameters are consistent with Grade I diastolic dysfunction (impaired relaxation). Right Ventricle: The right ventricular size is normal. No increase in right ventricular wall thickness. Right ventricular systolic function is normal. Tricuspid regurgitation signal is inadequate for assessing PA pressure. Left Atrium: Left atrial size was normal in  size. Right Atrium: Right atrial size was normal in size. Pericardium: There is no evidence of pericardial effusion. Mitral Valve: The mitral valve is normal in structure. Trivial mitral valve regurgitation. Tricuspid Valve: The tricuspid valve is normal in structure. Tricuspid valve regurgitation is trivial. Aortic Valve: The aortic valve was not well visualized. Aortic valve regurgitation is not visualized. No aortic stenosis is present. Pulmonic Valve: The pulmonic valve was not well visualized. Pulmonic valve regurgitation is trivial. Aorta: The aortic root and ascending aorta are structurally normal, with no evidence of dilitation. Venous: The inferior vena cava is normal in size with greater than 50% respiratory variability, suggesting right atrial pressure of 3 mmHg. IAS/Shunts: The interatrial septum was not well visualized.  LEFT VENTRICLE PLAX 2D LVIDd:         5.30 cm   Diastology LVIDs:         4.10 cm   LV e' medial:    6.31 cm/s LV PW:         1.20 cm   LV E/e' medial:  8.0 LV IVS:        1.20 cm   LV e' lateral:   7.51 cm/s LVOT diam:     2.20 cm   LV E/e' lateral: 6.7 LV SV:         54 LV SV Index:   28 LVOT Area:     3.80 cm  RIGHT VENTRICLE             IVC RV S prime:     12.20 cm/s  IVC diam: 1.00 cm TAPSE (M-mode): 2.0 cm LEFT ATRIUM  Index        RIGHT ATRIUM           Index LA diam:        3.80 cm 1.98 cm/m   RA Area:     13.30 cm LA Vol (A2C):   32.9 ml 17.14 ml/m  RA Volume:   28.40 ml  14.79 ml/m LA Vol (A4C):   26.1 ml 13.60 ml/m LA Biplane Vol: 30.1 ml 15.68 ml/m  AORTIC VALVE LVOT Vmax:   91.20 cm/s LVOT Vmean:  57.100 cm/s LVOT VTI:    0.142 m  AORTA Ao Root diam: 2.60 cm Ao Asc diam:  3.40 cm MITRAL VALVE MV Area (PHT): 2.91 cm    SHUNTS MV Decel Time: 261 msec    Systemic VTI:  0.14 m MV E velocity: 50.30 cm/s  Systemic Diam: 2.20 cm MV A velocity: 68.10 cm/s MV E/A ratio:  0.74 Lonni Nanas MD Electronically signed by Lonni Nanas MD Signature  Date/Time: 12/24/2023/2:41:37 PM    Final    CT Angio Chest PE W and/or Wo Contrast Result Date: 12/24/2023 EXAM: CTA of the Chest with contrast for PE 12/24/2023 05:29:18 AM TECHNIQUE: CTA of the chest was performed after the administration of intravenous contrast. Multiplanar reformatted images are provided for review. MIP images are provided for review. Automated exposure control, iterative reconstruction, and/or weight based adjustment of the mA/kV was utilized to reduce the radiation dose to as low as reasonably achievable. COMPARISON: 05/30/2017 CLINICAL HISTORY: Pulmonary embolism (PE) suspected, high prob. Cp, leg swelling FINDINGS: PULMONARY ARTERIES: Pulmonary arteries are adequately opacified for evaluation. No pulmonary embolism. Main pulmonary artery is normal in caliber. MEDIASTINUM: The heart and pericardium demonstrate no acute abnormality. There is no acute abnormality of the thoracic aorta. Aortic atherosclerotic calcification and coronary artery calcification. LYMPH NODES: No mediastinal, hilar or axillary lymphadenopathy. LUNGS AND PLEURA: Band like area of scarring noted within the lingula. Nodule within the superior segment of the right lower lobe is new from previous exam measuring 5 mm, image 51/6. Unchanged, broad-based pleural nodule overlying the posterior medial left upper lobe measuring 0.9 cm, image 44/6. This most likely represents a benign abnormality. UPPER ABDOMEN: Limited images of the upper abdomen are unremarkable. SOFT TISSUES AND BONES: No acute bone or soft tissue abnormality. IMPRESSION: 1. No evidence of pulmonary embolism. 2. New 5 mm nodule in the superior segment of the right lower lobe.In a patient who was at low risk, no further follow-up is indicated. If the patient is at increased risk a follow-up CT of the chest without contrast material in 12 months may be considered. 3. Unchanged 0.9 cm broad-based pleural nodule overlying the posterior medial left upper lobe,  likely benign. Electronically signed by: Waddell Calk MD 12/24/2023 06:08 AM EDT RP Workstation: [id number]   DG Chest 2 View Result Date: 12/23/2023 CLINICAL DATA:  Chest pain and shortness of breath EXAM: CHEST - 2 VIEW COMPARISON:  03/15/2022 FINDINGS: The heart size and mediastinal contours are within normal limits. Both lungs are clear. The visualized skeletal structures are unremarkable. IMPRESSION: No active cardiopulmonary disease. Electronically Signed   By: Oneil Devonshire M.D.   On: 12/23/2023 23:25    EKG: None  Radiology: No results found.   Procedures   Medications Ordered in the ED  naproxen  (NAPROSYN ) tablet 500 mg (has no administration in time range)  acetaminophen  (TYLENOL ) tablet 1,000 mg (has no administration in time range)  Medical Decision Making Patient with ongoing all over body pain   Amount and/or Complexity of Data Reviewed External Data Reviewed: labs and notes.    Details: Previous notes and labs reviewed  Labs: ordered.    Details: Normal sodium   Risk OTC drugs. Prescription drug management.     Final diagnoses:  None   No signs of systemic illness or infection. The patient is nontoxic-appearing on exam and vital signs are within normal limits.  I have reviewed the triage vital signs and the nursing notes. Pertinent labs & imaging results that were available during my care of the patient were reviewed by me and considered in my medical decision making (see chart for details). After history, exam, and medical workup I feel the patient has been appropriately medically screened and is safe for discharge home. Pertinent diagnoses were discussed with the patient. Patient was given return precautions.    ED Discharge Orders     None          Katharyn Schauer, MD 12/30/23 [id number]

## 2023-12-30 NOTE — ED Provider Notes (Signed)
 Patient was angry about his chronic pain when I was attempting to take a history and do my exam  He was annoyed with choice of medications.  He will not be getting opioids for chronic widespread pain.  Stable for discharge    Siddarth Hsiung, MD 12/30/23 [id number]

## 2024-01-05 NOTE — Progress Notes (Incomplete)
 HEART & VASCULAR TRANSITION OF CARE CONSULT NOTE     Referring Physician: Dr. Kassie PCP: Patient, No Pcp Per   Chief Complaint: HFimpEF  HPI: Referred to clinic by Dr. Kassie for heart failure consultation.   Allen Mayer is a 63 y.o. homeless male with chronic HFrEF, COPD, hypertension, CKD stage III, tobacco abuse and polysubstance abuse.  He was admitted 9/25 with chest pain, pleuritic in nature.  Cardiac workup relatively negative.  Drug screen positive for cocaine.  Echo with EF 35-40%, mild LVH, GIDD, RV normal, trivial MR.  Suspected increase in EF 2/2 him being compliant with medications while incarcerated, previously less than 20%.  Incidental finding of 5 mm nodule in the superior segment of the right lower lobe with an unchanged 0.9 cm broad-based pleural nodule overlying the posterior medial left upper lobe.  GDMT restarted at discharge.  Today he presents for AHF [facility] clinic visit. Overall feeling ***. Denies palpitations, CP, dizziness, edema, or PND/Orthopnea. *** SOB. Appetite ok. No fever or chills. Weight at home *** pounds. Taking all medications. Denies ETOH, tobacco or drug use.    Cardiac Testing  Echo 9/25 EF 35-40%, mild LVH, GIDD, RV normal, trivial MR. Limited echo 7/23 EF less than 20% L/RHC 4/23: RA 4, PA 32/15, PCWP 16, Fick CO/CI 3.4/1.7.  Mild nonobstructive CAD. L/RHC 9/19: RA 9, PA 52/31 (41), PWP 26, LVEDP 30 Fick CO/CI 5.29/2.44.  Clean coronaries  Past Medical History:  Diagnosis Date   Acid reflux    Alcohol abuse    Arthritis    Asthma    Back pain    Bronchitis    Chest pain 07/31/2021   CHF (congestive heart failure) (HCC)    CKD (chronic kidney disease)    CKD (chronic kidney disease), stage III (HCC)    Cocaine abuse (HCC)    COPD (chronic obstructive pulmonary disease) (HCC)    History of noncompliance with medical treatment, presenting hazards to health    Homelessness    Hypertension    LV (left ventricular) mural  thrombus    Myocardial infarction [facility])    Neuropathic pain    NICM (nonischemic cardiomyopathy) (HCC) 2019   NSTEMI (non-ST elevated myocardial infarction) (HCC) 07/31/2021   NSVT (nonsustained ventricular tachycardia) (HCC)    Pericardial effusion    Pulmonary hypertension (HCC)    RVF (right ventricular failure) (HCC)    Schizo affective schizophrenia (HCC)     Current Outpatient Medications  Medication Sig Dispense Refill   albuterol  (VENTOLIN  HFA) 108 (90 Base) MCG/ACT inhaler Inhale 1-2 puffs into the lungs every 6 (six) hours as needed for wheezing or shortness of breath. 18 g 0   ARIPiprazole  (ABILIFY ) 10 MG tablet Take 1 tablet (10 mg total) by mouth daily. 90 tablet 0   aspirin  EC 81 MG tablet Take 1 tablet (81 mg total) by mouth daily. Swallow whole. 60 tablet 0   atorvastatin  (LIPITOR ) 80 MG tablet Take 1 tablet (80 mg total) by mouth daily. 30 tablet 0   cholecalciferol  (VITAMIN D3) 25 MCG (1000 UNIT) tablet Take 5 tablets (5,000 Units total) by mouth daily. 150 tablet 0   citalopram  (CELEXA ) 20 MG tablet Take 1 tablet (20 mg total) by mouth daily. 90 tablet 0   digoxin  (LANOXIN ) 0.125 MG tablet Take 1 tablet (0.125 mg total) by mouth daily. 30 tablet 1   empagliflozin  (JARDIANCE ) 10 MG TABS tablet Take 1 tablet (10 mg total) by mouth daily. 60 tablet 0  gabapentin  (NEURONTIN ) 400 MG capsule Take 1 capsule (400 mg total) by mouth daily as needed (nerve pain). 90 capsule 0   losartan  (COZAAR ) 25 MG tablet Take 1 tablet (25 mg total) by mouth daily. 60 tablet 0   meloxicam  (MOBIC ) 7.5 MG tablet Take 1 tablet (7.5 mg total) by mouth daily. 5 tablet 0   mometasone -formoterol  (DULERA ) 200-5 MCG/ACT AERO Inhale 2 puffs into the lungs 2 (two) times daily. 13 g 2   rivaroxaban  (XARELTO ) 20 MG TABS tablet Take 1 tablet (20 mg total) by mouth daily with supper. 60 tablet 0   spironolactone  (ALDACTONE ) 25 MG tablet Take 0.5 tablets (12.5 mg total) by mouth daily. 90 tablet 0   torsemide   (DEMADEX ) 20 MG tablet Take 1 tablet (20 mg total) by mouth daily. 90 tablet 0   umeclidinium bromide  (INCRUSE ELLIPTA ) 62.5 MCG/ACT AEPB Inhale 1 puff into the lungs daily. (Patient not taking: Reported on 12/24/2023) 30 each 0   No current facility-administered medications for this visit.    Allergies  Allergen Reactions   Flexeril  [Cyclobenzaprine ] Other (See Comments)    Caused cramping and patient did not like the way it made it feel. Requests this medication to NOT be given to him.      Social History   Socioeconomic History   Marital status: Divorced    Spouse name: Not on file   Number of children: 4   Years of education: Not on file   Highest education level: High school graduate  Occupational History   Occupation: disability  Tobacco Use   Smoking status: Every Day    Current packs/day: 0.50    Types: Cigarettes   Smokeless tobacco: Never  Vaping Use   Vaping status: Never Used  Substance and Sexual Activity   Alcohol use: Yes    Comment: occasionally   Drug use: Yes    Types: Cocaine   Sexual activity: Not on file  Other Topics Concern   Not on file  Social History Narrative   ** Merged History Encounter **       Lives with fiance.     Social Drivers of Corporate investment banker Strain: High Risk (12/25/2023)   Overall Financial Resource Strain (CARDIA)    Difficulty of Paying Living Expenses: Very hard  Food Insecurity: No Food Insecurity (12/25/2023)   Hunger Vital Sign    Worried About Running Out of Food in the Last Year: Never true    Ran Out of Food in the Last Year: Never true  Transportation Needs: No Transportation Needs (12/25/2023)   PRAPARE - Administrator, Civil Service (Medical): No    Lack of Transportation (Non-Medical): No  Physical Activity: Not on File (07/29/2021)   Received from [facility]   Physical Activity    Physical Activity: 0  Stress: Not on File (07/29/2021)   Received from [facility]   Stress    Stress: 0  Social  Connections: Not on File (12/24/2022)   Received from [facility]   Social Connections    Connectedness: 0  Intimate Partner Violence: Not At Risk (12/25/2023)   Humiliation, Afraid, Rape, and Kick questionnaire    Fear of Current or Ex-Partner: No    Emotionally Abused: No    Physically Abused: No    Sexually Abused: No      Family History  Problem Relation Age of Onset   Heart attack Mother        Died age 54   Heart attack  Brother        40    There were no vitals filed for this visit.  PHYSICAL EXAM: General:  *** appearing.  No respiratory difficulty Neck: JVD *** cm.  Cor: Regular rate & rhythm. No murmurs. Lungs: clear Extremities: no edema  Neuro: alert & oriented x 3. Affect pleasant.   ECG:   ASSESSMENT & PLAN: HFimpEF, previously HFrEF - L/RHC 4/23: RA 4, PA 32/15, PCWP 16, Fick CO/CI 3.4/1.7.  Mild nonobstructive CAD. - Limited echo 7/23 EF less than 20% - Echo 9/25 EF 35-40%, mild LVH, GIDD, RV normal, trivial MR. NYHA *** GDMT  Diuretic-continue torsemide  20 mg daily BB-continue digoxin  0.125 mg daily. Avoid BB with ongoing cocaine use Ace/ARB/ARNI continue losartan  25 mg daily MRA continue spironolactone  12.5 mg daily SGLT2i continue Jardiance  10 mg daily -Candidate for advanced therapies at this time with ongoing cocaine use.  Hypertension -BP ***today -Meds as above  Mild nonobstructive CAD -As seen on [facility] 4/23 -Continue ASA and statin - Denies CP  CKD stage III -SCr baseline ~1 - Avoid hypotension - Bmet today  Polysubstance abuse (cocaine/tobacco) -Cocaine positive last admission -Cessation advised  COPD -Needs follow-up with pulmonology  Needs PCP***   Referred to HFSW (PCP, Medications, Transportation, ETOH Abuse, Drug Abuse, Insurance, Financial ): Yes or No Refer to Pharmacy: Yes or No Refer to Home Health: Yes on No Refer to [facility]: Yes or no  Refer to General Cardiology: Yes or No  Follow up

## 2024-01-08 ENCOUNTER — Encounter (HOSPITAL_COMMUNITY)

## 2024-01-08 ENCOUNTER — Encounter (HOSPITAL_COMMUNITY): Payer: Self-pay

## 2024-01-08 ENCOUNTER — Ambulatory Visit (HOSPITAL_COMMUNITY)
Admission: RE | Admit: 2024-01-08 | Discharge: 2024-01-08 | Disposition: A | Discharge status: Home / Self Care | Source: Ambulatory Visit | Attending: Internal Medicine | Admitting: Internal Medicine

## 2024-01-08 ENCOUNTER — Ambulatory Visit (HOSPITAL_COMMUNITY): Payer: Self-pay | Admitting: Internal Medicine

## 2024-01-08 VITALS — BP 138/105 | HR 97 | Ht 72.0 in | Wt 213.4 lb

## 2024-01-08 DIAGNOSIS — Z79899 Other long term (current) drug therapy: Secondary | ICD-10-CM | POA: Insufficient documentation

## 2024-01-08 DIAGNOSIS — Z139 Encounter for screening, unspecified: Secondary | ICD-10-CM

## 2024-01-08 DIAGNOSIS — J449 Chronic obstructive pulmonary disease, unspecified: Secondary | ICD-10-CM | POA: Insufficient documentation

## 2024-01-08 DIAGNOSIS — I513 Intracardiac thrombosis, not elsewhere classified: Secondary | ICD-10-CM

## 2024-01-08 DIAGNOSIS — Z5941 Food insecurity: Secondary | ICD-10-CM | POA: Diagnosis not present

## 2024-01-08 DIAGNOSIS — I5022 Chronic systolic (congestive) heart failure: Secondary | ICD-10-CM | POA: Insufficient documentation

## 2024-01-08 DIAGNOSIS — Z5986 Financial insecurity: Secondary | ICD-10-CM | POA: Insufficient documentation

## 2024-01-08 DIAGNOSIS — F1410 Cocaine abuse, uncomplicated: Secondary | ICD-10-CM | POA: Insufficient documentation

## 2024-01-08 DIAGNOSIS — N183 Chronic kidney disease, stage 3 unspecified: Secondary | ICD-10-CM | POA: Insufficient documentation

## 2024-01-08 DIAGNOSIS — Z720 Tobacco use: Secondary | ICD-10-CM

## 2024-01-08 DIAGNOSIS — Z7901 Long term (current) use of anticoagulants: Secondary | ICD-10-CM | POA: Diagnosis not present

## 2024-01-08 DIAGNOSIS — Z7982 Long term (current) use of aspirin: Secondary | ICD-10-CM | POA: Diagnosis not present

## 2024-01-08 DIAGNOSIS — I10 Essential (primary) hypertension: Secondary | ICD-10-CM

## 2024-01-08 DIAGNOSIS — I2510 Atherosclerotic heart disease of native coronary artery without angina pectoris: Secondary | ICD-10-CM | POA: Insufficient documentation

## 2024-01-08 DIAGNOSIS — I5032 Chronic diastolic (congestive) heart failure: Secondary | ICD-10-CM

## 2024-01-08 DIAGNOSIS — I130 Hypertensive heart and chronic kidney disease with heart failure and stage 1 through stage 4 chronic kidney disease, or unspecified chronic kidney disease: Secondary | ICD-10-CM | POA: Insufficient documentation

## 2024-01-08 DIAGNOSIS — Z5900 Homelessness unspecified: Secondary | ICD-10-CM | POA: Insufficient documentation

## 2024-01-08 DIAGNOSIS — R918 Other nonspecific abnormal finding of lung field: Secondary | ICD-10-CM | POA: Insufficient documentation

## 2024-01-08 DIAGNOSIS — F17200 Nicotine dependence, unspecified, uncomplicated: Secondary | ICD-10-CM | POA: Insufficient documentation

## 2024-01-08 DIAGNOSIS — Z7984 Long term (current) use of oral hypoglycemic drugs: Secondary | ICD-10-CM | POA: Insufficient documentation

## 2024-01-08 LAB — BASIC METABOLIC PANEL WITH GFR
Anion gap: 9 (ref 5–15)
BUN: 13 mg/dL (ref 8–23)
CO2: 27 mmol/L (ref 22–32)
Calcium: 9.9 mg/dL (ref 8.9–10.3)
Chloride: 101 mmol/L (ref 98–111)
Creatinine, Ser: 0.94 mg/dL (ref 0.61–1.24)
GFR, Estimated: >60 mL/min (ref >60)
Glucose, Bld: 90 mg/dL (ref 70–99)
Potassium: 4.6 mmol/L (ref 3.5–5.1)
Sodium: 137 mmol/L (ref 135–145)

## 2024-01-08 LAB — BRAIN NATRIURETIC PEPTIDE: B Natriuretic Peptide: 27.6 pg/mL (ref 0.0–100.0)

## 2024-01-08 MED ORDER — DIGOXIN 125 MCG PO TABS: 0.1250 mg | ORAL_TABLET | Freq: Every day | ORAL | 1 refills | Status: DC

## 2024-01-08 MED ORDER — SPIRONOLACTONE 25 MG PO TABS: 25.0000 mg | ORAL_TABLET | Freq: Every day | ORAL | 1 refills | Status: DC

## 2024-01-08 MED ORDER — LOSARTAN POTASSIUM 25 MG PO TABS: 25.0000 mg | ORAL_TABLET | Freq: Every day | ORAL | 1 refills | Status: DC

## 2024-01-08 MED ORDER — TORSEMIDE 20 MG PO TABS: 20.0000 mg | ORAL_TABLET | Freq: Every day | ORAL | 1 refills | Status: DC

## 2024-01-08 MED ORDER — EMPAGLIFLOZIN 10 MG PO TABS: 10.0000 mg | ORAL_TABLET | Freq: Every day | ORAL | 1 refills | Status: DC

## 2024-01-08 MED ORDER — ATORVASTATIN CALCIUM 80 MG PO TABS: 80.0000 mg | ORAL_TABLET | Freq: Every day | ORAL | 1 refills | Status: DC

## 2024-01-08 MED ORDER — ASPIRIN 81 MG PO TBEC: 81.0000 mg | DELAYED_RELEASE_TABLET | Freq: Every day | ORAL | 1 refills | Status: DC

## 2024-01-08 NOTE — Patient Instructions (Signed)
 Medication Changes:  CARDIAC MEDICATIONS REFILLED   INCREASE SPIRONOLACTONE  TO 25MG  (1) TABLET ONCE DAILY   Lab Work:  Labs done today, your results will be available in MyChart, we will contact you for abnormal readings.  Referrals:  YOU HAVE BEEN REFERRED TO GENERAL CARDIOLOGY THEY WILL REACH OUT TO YOU OR CALL TO ARRANGE THIS. PLEASE CALL US  WITH ANY CONCERNS   Special Instructions // Education:  PILL BOX AND SCALE PROVIDED TO YOU TODAY   Follow-Up in: 3 WEEKS AS SCHEDULED WITH TOC CLINIC   At the [facility], you and your health needs are our priority. We have a designated team specialized in the treatment of Heart Failure. This Care Team includes your primary Heart Failure Specialized Cardiologist (physician), Advanced Practice Providers (APPs- Physician Assistants and Nurse Practitioners), and Pharmacist who all work together to provide you with the care you need, when you need it.   You may see any of the following providers on your designated Care Team at your next follow up:  Dr. Toribio Fuel Dr. Ezra Shuck Dr. Ria Commander Dr. Odis Brownie Greig Mosses, NP Caffie Shed, [place] [facility], [place] Beckey Coe, NP [place] Lee, NP Tinnie Redman, PharmD   Please be sure to bring in all your medications bottles to every appointment.   Need to Contact Us :  If you have any questions or concerns before your next appointment please send us  a message through [place] or call our office at [phone].    TO LEAVE A MESSAGE FOR THE NURSE SELECT OPTION 2, PLEASE LEAVE A MESSAGE INCLUDING: YOUR NAME DATE OF BIRTH CALL BACK NUMBER REASON FOR CALL**this is important as we prioritize the call backs  YOU WILL RECEIVE A CALL BACK THE SAME DAY AS LONG AS YOU CALL BEFORE 4:00 PM

## 2024-01-08 NOTE — Progress Notes (Signed)
 Heart and Vascular Care Navigation  01/08/2024  Allen Mayer 17-Feb-1961 [id number]  Reason for Referral: homelessness/SDOH concerns   Engaged with patient face to face for initial visit for Heart and Vascular Care Coordination.                                                                                                   Assessment:  CSW met with pt to discuss current SDOH concerns.  Per chart review pt has struggled with homelessness for years.  Pt reports he is currently staying outside but [facility] through [employer] and [facility] voucher program.  Pt has had issues remaining in contact with them as he does not have a phone.  CSW provided with phone- unlimited minutes until 10/29.  Left message for [employer] program with pt new number- they confirmed receipt and sending updated information to pt case worker.  Pt denies need for food- gets food from soup kitchens as he can't carry around with him easily.  Uses bus system to get around- provided bus passes.                                      HRT/VAS Care Coordination     Patients Home Cardiology Office [facility]   Outpatient Care Team Social Worker   Social Worker Name: Lonell Crouch, KEN [phone]   Living arrangements for the past 2 months Homeless   Lives with: Self   Patient Current Insurance Coverage Medicaid   Patient Has Concern With Paying Medical Bills No   Does Patient Have Prescription Coverage? Yes   Home Assistive Devices/Equipment Eyeglasses; Cane (specify quad or straight)   DME Agency [employer]       Social History:                                                                             SDOH Screenings   Food Insecurity: No Food Insecurity (12/25/2023)  Housing: High Risk (01/08/2024)  Transportation Needs: No Transportation Needs (12/25/2023)  Utilities: Not At Risk (12/25/2023)  Alcohol Screen: Low Risk  (02/25/2022)  Financial Resource Strain: High Risk  (12/25/2023)  Physical Activity: Not on File (07/29/2021)   [facility]  Social Connections: Not on File (12/24/2022)   Received [facility]  Stress: Not on File (07/29/2021)   Received from [facility]  Tobacco Use: High Risk (01/08/2024)    SDOH Interventions: Financial Resources:    Awaiting disability benefits being restarted  Food Insecurity:  Uses soup kitchens  Housing Insecurity:  Housing Interventions: Programmer, applications Provided  Transportation:   Utilizes the bus- passes provided     Follow-up plan:    Will check back in with pt during next  visit with [facility] clinic to see if we can further assist.  Allen HILARIO Leech, LCSW Clinical Social Worker [facility] Desk#: [phone] Cell#: [phone]

## 2024-01-11 ENCOUNTER — Emergency Department (HOSPITAL_COMMUNITY)
Admission: EM | Admit: 2024-01-11 | Discharge: 2024-01-12 | Disposition: A | Discharge status: Home / Self Care | Attending: Emergency Medicine | Admitting: Emergency Medicine

## 2024-01-11 ENCOUNTER — Emergency Department (HOSPITAL_COMMUNITY)

## 2024-01-11 DIAGNOSIS — Z7982 Long term (current) use of aspirin: Secondary | ICD-10-CM | POA: Diagnosis not present

## 2024-01-11 DIAGNOSIS — J4489 Other specified chronic obstructive pulmonary disease: Secondary | ICD-10-CM | POA: Insufficient documentation

## 2024-01-11 DIAGNOSIS — N183 Chronic kidney disease, stage 3 unspecified: Secondary | ICD-10-CM | POA: Insufficient documentation

## 2024-01-11 DIAGNOSIS — Z86718 Personal history of other venous thrombosis and embolism: Secondary | ICD-10-CM | POA: Diagnosis not present

## 2024-01-11 DIAGNOSIS — I130 Hypertensive heart and chronic kidney disease with heart failure and stage 1 through stage 4 chronic kidney disease, or unspecified chronic kidney disease: Secondary | ICD-10-CM | POA: Diagnosis not present

## 2024-01-11 DIAGNOSIS — Z7901 Long term (current) use of anticoagulants: Secondary | ICD-10-CM | POA: Insufficient documentation

## 2024-01-11 DIAGNOSIS — Z7951 Long term (current) use of inhaled steroids: Secondary | ICD-10-CM | POA: Insufficient documentation

## 2024-01-11 DIAGNOSIS — Z79899 Other long term (current) drug therapy: Secondary | ICD-10-CM | POA: Diagnosis not present

## 2024-01-11 DIAGNOSIS — R072 Precordial pain: Secondary | ICD-10-CM | POA: Insufficient documentation

## 2024-01-11 DIAGNOSIS — R079 Chest pain, unspecified: Secondary | ICD-10-CM | POA: Diagnosis present

## 2024-01-11 DIAGNOSIS — I509 Heart failure, unspecified: Secondary | ICD-10-CM | POA: Diagnosis not present

## 2024-01-11 LAB — CBC
HCT: 47.2 % (ref 39.0–52.0)
Hemoglobin: 15 g/dL (ref 13.0–17.0)
MCH: 28.4 pg (ref 26.0–34.0)
MCHC: 31.8 g/dL (ref 30.0–36.0)
MCV: 89.4 fL (ref 80.0–100.0)
Platelets: 237 K/uL (ref 150–400)
RBC: 5.28 MIL/uL (ref 4.22–5.81)
RDW: 14.8 % (ref 11.5–15.5)
WBC: 5.8 K/uL (ref 4.0–10.5)
nRBC: 0 % (ref 0.0–0.2)

## 2024-01-11 LAB — BASIC METABOLIC PANEL WITH GFR
Anion gap: 14 (ref 5–15)
BUN: 20 mg/dL (ref 8–23)
CO2: 23 mmol/L (ref 22–32)
Calcium: 9.1 mg/dL (ref 8.9–10.3)
Chloride: 104 mmol/L (ref 98–111)
Creatinine, Ser: 1.26 mg/dL — ABNORMAL HIGH (ref 0.61–1.24)
GFR, Estimated: >60 mL/min (ref >60)
Glucose, Bld: 125 mg/dL — ABNORMAL HIGH (ref 70–99)
Potassium: 4.2 mmol/L (ref 3.5–5.1)
Sodium: 141 mmol/L (ref 135–145)

## 2024-01-11 LAB — TROPONIN I (HIGH SENSITIVITY): Troponin I (High Sensitivity): 23 ng/L — ABNORMAL HIGH (ref <18)

## 2024-01-11 NOTE — ED Provider Notes (Signed)
 [place] EMERGENCY DEPARTMENT AT [facility] Provider Note   CSN: [id number] Arrival date & time: 01/11/24  2128     Patient presents with: Chest Pain   Allen Mayer is a 63 y.o. male.  {Add pertinent medical, surgical, social history, OB history to [id number]} Patient with history of CHF, CKD, cocaine abuse, COPD, homelessness, NSTEMI,      has a past medical history of Acid reflux, Alcohol abuse, Arthritis, Asthma, Back pain, Bronchitis, Chest pain (07/31/2021), CHF (congestive heart failure) (HCC), CKD (chronic kidney disease), CKD (chronic kidney disease), stage III (HCC), Cocaine abuse (HCC), COPD (chronic obstructive pulmonary disease) (HCC), History of noncompliance with medical treatment, presenting hazards to health, Homelessness, Hypertension, LV (left ventricular) mural thrombus, Myocardial infarction [facility]), Neuropathic pain, NICM (nonischemic cardiomyopathy) (HCC) (2019), NSTEMI (non-ST elevated myocardial infarction) (HCC) (07/31/2021), NSVT (nonsustained ventricular tachycardia) (HCC), Pericardial effusion, Pulmonary hypertension (HCC), RVF (right ventricular failure) (HCC), and Schizo affective schizophrenia (HCC).   The history is provided by the patient. No language interpreter was used.  Chest Pain      Prior to Admission medications   Medication Sig Start Date End Date Taking? Authorizing Provider  albuterol  (VENTOLIN  HFA) 108 (90 Base) MCG/ACT inhaler Inhale 1-2 puffs into the lungs every 6 (six) hours as needed for wheezing or shortness of breath. 12/25/23   Alto Isaiah CROME, NP  ARIPiprazole  (ABILIFY ) 10 MG tablet Take 1 tablet (10 mg total) by mouth daily. 12/25/23   Alto Isaiah CROME, NP  aspirin  EC 81 MG tablet Take 1 tablet (81 mg total) by mouth daily. Swallow whole. 01/08/24   Hayes Beckey CROME, NP  atorvastatin  (LIPITOR ) 80 MG tablet Take 1 tablet (80 mg total) by mouth daily. 01/08/24   Hayes Beckey CROME, NP  cholecalciferol  (VITAMIN D3) 25 MCG (1000 UNIT)  tablet Take 5 tablets (5,000 Units total) by mouth daily. 12/25/23 01/24/24  Alto Isaiah CROME, NP  citalopram  (CELEXA ) 20 MG tablet Take 1 tablet (20 mg total) by mouth daily. 12/25/23   Alto Isaiah CROME, NP  digoxin  (LANOXIN ) 0.125 MG tablet Take 1 tablet (0.125 mg total) by mouth daily. 01/08/24   Hayes Beckey CROME, NP  empagliflozin  (JARDIANCE ) 10 MG TABS tablet Take 1 tablet (10 mg total) by mouth daily. 01/08/24   Hayes Beckey CROME, NP  gabapentin  (NEURONTIN ) 400 MG capsule Take 1 capsule (400 mg total) by mouth daily as needed (nerve pain). 12/25/23   Alto Isaiah CROME, NP  losartan  (COZAAR ) 25 MG tablet Take 1 tablet (25 mg total) by mouth daily. 01/08/24   Hayes Beckey CROME, NP  meloxicam  (MOBIC ) 7.5 MG tablet Take 1 tablet (7.5 mg total) by mouth daily. Patient not taking: Reported on 01/08/2024 12/30/23   Palumbo, April, MD  mometasone -formoterol  (DULERA ) 200-5 MCG/ACT AERO Inhale 2 puffs into the lungs 2 (two) times daily. 02/28/22   Arrien, Mauricio Daniel, MD  rivaroxaban  (XARELTO ) 20 MG TABS tablet Take 1 tablet (20 mg total) by mouth daily with supper. 12/25/23 02/23/24  Alto Isaiah CROME, NP  spironolactone  (ALDACTONE ) 25 MG tablet Take 1 tablet (25 mg total) by mouth daily. 01/08/24   Hayes Beckey CROME, NP  torsemide  (DEMADEX ) 20 MG tablet Take 1 tablet (20 mg total) by mouth daily. 01/08/24   Hayes Beckey CROME, NP  umeclidinium bromide  (INCRUSE ELLIPTA ) 62.5 MCG/ACT AEPB Inhale 1 puff into the lungs daily. 01/18/22   Tobie Yetta HERO, MD    Allergies: Flexeril  [cyclobenzaprine ]    Review of Systems  Cardiovascular:  Positive  for chest pain.    Updated Vital Signs BP (!) 126/112 (BP Location: Right Arm)   Pulse 64   Temp 98.4 F (36.9 C)   Resp 16   SpO2 94%   Physical Exam  (all labs ordered are listed, but only abnormal results are displayed) Labs Reviewed  BASIC METABOLIC PANEL WITH GFR - Abnormal; Notable for the following components:      Result Value   Glucose, Bld 125 (*)    Creatinine, Ser 1.26  (*)    All other components within normal limits  TROPONIN I (HIGH SENSITIVITY) - Abnormal; Notable for the following components:   Troponin I (High Sensitivity) 23 (*)    All other components within normal limits  CBC  TROPONIN I (HIGH SENSITIVITY)    EKG: None  Radiology: DG Chest 2 View Result Date: 01/11/2024 EXAM: 2 VIEW(S) XRAY OF THE CHEST 01/11/2024 10:21:00 PM COMPARISON: 12/23/2023 CLINICAL HISTORY: CP. Per triage notes: Patient here with complaints of central chest pain, full body aches, shob and weakness. Patient states this started 2 hours ago while he was on the bus  FINDINGS: LUNGS AND PLEURA: No focal pulmonary opacity. No pulmonary edema. No pleural effusion. No pneumothorax. HEART AND MEDIASTINUM: No acute abnormality of the cardiac and mediastinal silhouettes. BONES AND SOFT TISSUES: No acute osseous abnormality. IMPRESSION: 1. No acute abnormalities. Electronically signed by: Norman Gatlin MD 01/11/2024 10:24 PM EDT RP Workstation: [id number]    {Document cardiac monitor, telemetry assessment procedure when appropriate:32947} Procedures   Medications Ordered in the ED - No data to display    {Click here for ABCD2, HEART and other calculators REFRESH Note before signing:1}                              Medical Decision Making Amount and/or Complexity of Data Reviewed Labs: ordered. Radiology: ordered.   ***  {Document critical care time when appropriate  Document review of labs and clinical decision tools ie CHADS2VASC2, etc  Document your independent review of radiology images and any outside records  Document your discussion with family members, caretakers and with consultants  Document social determinants of health affecting pt's care  Document your decision making why or why not admission, treatments were needed:32947:::1}   Final diagnoses:  None    ED Discharge Orders     None

## 2024-01-11 NOTE — ED Triage Notes (Signed)
 Pt states that approximately one hour ago he began having midsternal CP radiating to his back and neck area.

## 2024-01-11 NOTE — ED Triage Notes (Signed)
 Patient here with complaints of central chest pain, full body aches, shob and weakness. Patient states this started 2 hours ago while he was on the bus.

## 2024-01-11 NOTE — ED Provider Notes (Incomplete)
 [place] EMERGENCY DEPARTMENT AT [facility] Provider Note   CSN: [id number] Arrival date & time: 01/11/24  2128     Patient presents with: Chest Pain   Allen Mayer is a 63 y.o. male.  {Add pertinent medical, surgical, social history, OB history to YEP:67052} Patient with history of CHF, CKD, cocaine abuse, COPD, homelessness, NSTEMI, schizophrenia presents today with complaints of chest pain. Reports that symptoms began 2 hours prior to arrival when he was on the bus. Reports pain is substernal in nature and feels like burning. Reports some mild dyspnea. Does not feel fluid overloaded. Denies nausea or vomiting. No leg pain or leg swelling, recent travel, or recent surgeries. No fevers, chills, cough, or congestion. Does report continued cocaine use, last use was 2-3 days ago.  The history is provided by the patient. No language interpreter was used.  Chest Pain      Prior to Admission medications   Medication Sig Start Date End Date Taking? Authorizing Provider  albuterol  (VENTOLIN  HFA) 108 (90 Base) MCG/ACT inhaler Inhale 1-2 puffs into the lungs every 6 (six) hours as needed for wheezing or shortness of breath. 12/25/23   Alto Isaiah CROME, NP  ARIPiprazole  (ABILIFY ) 10 MG tablet Take 1 tablet (10 mg total) by mouth daily. 12/25/23   Alto Isaiah CROME, NP  aspirin  EC 81 MG tablet Take 1 tablet (81 mg total) by mouth daily. Swallow whole. 01/08/24   Hayes Beckey CROME, NP  atorvastatin  (LIPITOR ) 80 MG tablet Take 1 tablet (80 mg total) by mouth daily. 01/08/24   Hayes Beckey CROME, NP  cholecalciferol  (VITAMIN D3) 25 MCG (1000 UNIT) tablet Take 5 tablets (5,000 Units total) by mouth daily. 12/25/23 01/24/24  Alto Isaiah CROME, NP  citalopram  (CELEXA ) 20 MG tablet Take 1 tablet (20 mg total) by mouth daily. 12/25/23   Alto Isaiah CROME, NP  digoxin  (LANOXIN ) 0.125 MG tablet Take 1 tablet (0.125 mg total) by mouth daily. 01/08/24   Hayes Beckey CROME, NP  empagliflozin  (JARDIANCE ) 10 MG TABS  tablet Take 1 tablet (10 mg total) by mouth daily. 01/08/24   Hayes Beckey CROME, NP  gabapentin  (NEURONTIN ) 400 MG capsule Take 1 capsule (400 mg total) by mouth daily as needed (nerve pain). 12/25/23   Alto Isaiah CROME, NP  losartan  (COZAAR ) 25 MG tablet Take 1 tablet (25 mg total) by mouth daily. 01/08/24   Hayes Beckey CROME, NP  meloxicam  (MOBIC ) 7.5 MG tablet Take 1 tablet (7.5 mg total) by mouth daily. Patient not taking: Reported on 01/08/2024 12/30/23   Palumbo, April, MD  mometasone -formoterol  (DULERA ) 200-5 MCG/ACT AERO Inhale 2 puffs into the lungs 2 (two) times daily. 02/28/22   Arrien, Mauricio Daniel, MD  rivaroxaban  (XARELTO ) 20 MG TABS tablet Take 1 tablet (20 mg total) by mouth daily with supper. 12/25/23 02/23/24  Alto Isaiah CROME, NP  spironolactone  (ALDACTONE ) 25 MG tablet Take 1 tablet (25 mg total) by mouth daily. 01/08/24   Hayes Beckey CROME, NP  torsemide  (DEMADEX ) 20 MG tablet Take 1 tablet (20 mg total) by mouth daily. 01/08/24   Hayes Beckey CROME, NP  umeclidinium bromide  (INCRUSE ELLIPTA ) 62.5 MCG/ACT AEPB Inhale 1 puff into the lungs daily. 01/18/22   Tobie Yetta HERO, MD    Allergies: Flexeril  [cyclobenzaprine ]    Review of Systems  Cardiovascular:  Positive for chest pain.  All other systems reviewed and are negative.   Updated Vital Signs BP (!) 126/112 (BP Location: Right Arm)   Pulse 64  Temp 98.4 F (36.9 C)   Resp 16   SpO2 94%   Physical Exam Vitals and nursing note reviewed.  Constitutional:      General: He is not in acute distress.    Appearance: Normal appearance. He is normal weight. He is not ill-appearing, toxic-appearing or diaphoretic.  HENT:     Head: Normocephalic and atraumatic.  Cardiovascular:     Rate and Rhythm: Normal rate and regular rhythm.     Pulses:          Dorsalis pedis pulses are 2+ on the right side and 2+ on the left side.       Posterior tibial pulses are 2+ on the right side and 2+ on the left side.     Heart sounds: Normal heart sounds.   Pulmonary:     Effort: Pulmonary effort is normal. No respiratory distress.     Breath sounds: Normal breath sounds.     Comments: Laying flat in bed, resting comfortably in no acute distress Abdominal:     Palpations: Abdomen is soft.     Tenderness: There is no abdominal tenderness.  Musculoskeletal:        General: Normal range of motion.     Cervical back: Normal range of motion.     Right lower leg: No tenderness. No edema.     Left lower leg: No tenderness. No edema.  Skin:    General: Skin is warm and dry.  Neurological:     General: No focal deficit present.     Mental Status: He is alert.  Psychiatric:        Mood and Affect: Mood normal.        Behavior: Behavior normal.     (all labs ordered are listed, but only abnormal results are displayed) Labs Reviewed  BASIC METABOLIC PANEL WITH GFR - Abnormal; Notable for the following components:      Result Value   Glucose, Bld 125 (*)    Creatinine, Ser 1.26 (*)    All other components within normal limits  TROPONIN I (HIGH SENSITIVITY) - Abnormal; Notable for the following components:   Troponin I (High Sensitivity) 23 (*)    All other components within normal limits  CBC  TROPONIN I (HIGH SENSITIVITY)    EKG: None  Radiology: DG Chest 2 View Result Date: 01/11/2024 EXAM: 2 VIEW(S) XRAY OF THE CHEST 01/11/2024 10:21:00 PM COMPARISON: 12/23/2023 CLINICAL HISTORY: CP. Per triage notes: Patient here with complaints of central chest pain, full body aches, shob and weakness. Patient states this started 2 hours ago while he was on the bus  FINDINGS: LUNGS AND PLEURA: No focal pulmonary opacity. No pulmonary edema. No pleural effusion. No pneumothorax. HEART AND MEDIASTINUM: No acute abnormality of the cardiac and mediastinal silhouettes. BONES AND SOFT TISSUES: No acute osseous abnormality. IMPRESSION: 1. No acute abnormalities. Electronically signed by: Norman Gatlin MD 01/11/2024 10:24 PM EDT RP Workstation: [id number]     {Document cardiac monitor, telemetry assessment procedure when appropriate:32947} Procedures   Medications Ordered in the ED - No data to display    {Click here for ABCD2, HEART and other calculators REFRESH Note before signing:1}                              Medical Decision Making Amount and/or Complexity of Data Reviewed Labs: ordered. Radiology: ordered.   ***  {Document critical care time when appropriate  Document review of  labs and clinical decision tools ie CHADS2VASC2, etc  Document your independent review of radiology images and any outside records  Document your discussion with family members, caretakers and with consultants  Document social determinants of health affecting pt's care  Document your decision making why or why not admission, treatments were needed:32947:::1}   Final diagnoses:  None    ED Discharge Orders     None

## 2024-01-12 LAB — TROPONIN I (HIGH SENSITIVITY): Troponin I (High Sensitivity): 19 ng/L — ABNORMAL HIGH (ref <18)

## 2024-01-12 NOTE — Discharge Instructions (Signed)
You were seen in the emergency department today for chest pain.  As we discussed your lab work, EKG, chest x-ray all looked reassuring today.    I recommend monitoring your stress levels.  Continue to monitor how you are doing overall, and return to the emergency department for any new or worsening symptoms such as: Worsening pain or pain with exertion, difficulty breathing, sweating, or pain or swelling in your legs.

## 2024-01-18 ENCOUNTER — Other Ambulatory Visit (HOSPITAL_COMMUNITY)
Admission: EM | Admit: 2024-01-18 | Discharge: 2024-01-23 | Disposition: A | Discharge status: Home / Self Care | Source: Intra-hospital

## 2024-01-18 ENCOUNTER — Ambulatory Visit (HOSPITAL_COMMUNITY)
Admission: EM | Admit: 2024-01-18 | Discharge: 2024-01-18 | Disposition: A | Discharge status: Other Acute Inpatient Hospital | Attending: Behavioral Health | Admitting: Behavioral Health

## 2024-01-18 DIAGNOSIS — Z79899 Other long term (current) drug therapy: Secondary | ICD-10-CM | POA: Insufficient documentation

## 2024-01-18 DIAGNOSIS — J4489 Other specified chronic obstructive pulmonary disease: Secondary | ICD-10-CM | POA: Insufficient documentation

## 2024-01-18 DIAGNOSIS — F1410 Cocaine abuse, uncomplicated: Secondary | ICD-10-CM | POA: Insufficient documentation

## 2024-01-18 DIAGNOSIS — F4321 Adjustment disorder with depressed mood: Secondary | ICD-10-CM | POA: Diagnosis present

## 2024-01-18 DIAGNOSIS — F259 Schizoaffective disorder, unspecified: Secondary | ICD-10-CM | POA: Insufficient documentation

## 2024-01-18 DIAGNOSIS — F1010 Alcohol abuse, uncomplicated: Secondary | ICD-10-CM | POA: Diagnosis not present

## 2024-01-18 DIAGNOSIS — Z5900 Homelessness unspecified: Secondary | ICD-10-CM | POA: Insufficient documentation

## 2024-01-18 DIAGNOSIS — F1910 Other psychoactive substance abuse, uncomplicated: Secondary | ICD-10-CM | POA: Insufficient documentation

## 2024-01-18 DIAGNOSIS — F1090 Alcohol use, unspecified, uncomplicated: Secondary | ICD-10-CM | POA: Diagnosis not present

## 2024-01-18 DIAGNOSIS — Z7984 Long term (current) use of oral hypoglycemic drugs: Secondary | ICD-10-CM | POA: Insufficient documentation

## 2024-01-18 DIAGNOSIS — I110 Hypertensive heart disease with heart failure: Secondary | ICD-10-CM | POA: Insufficient documentation

## 2024-01-18 DIAGNOSIS — E785 Hyperlipidemia, unspecified: Secondary | ICD-10-CM | POA: Insufficient documentation

## 2024-01-18 DIAGNOSIS — Z7982 Long term (current) use of aspirin: Secondary | ICD-10-CM | POA: Insufficient documentation

## 2024-01-18 DIAGNOSIS — E1140 Type 2 diabetes mellitus with diabetic neuropathy, unspecified: Secondary | ICD-10-CM | POA: Insufficient documentation

## 2024-01-18 DIAGNOSIS — F4310 Post-traumatic stress disorder, unspecified: Secondary | ICD-10-CM | POA: Insufficient documentation

## 2024-01-18 DIAGNOSIS — I509 Heart failure, unspecified: Secondary | ICD-10-CM | POA: Insufficient documentation

## 2024-01-18 LAB — CBC WITH DIFFERENTIAL/PLATELET
Abs Immature Granulocytes: 0.01 K/uL (ref 0.00–0.07)
Basophils Absolute: 0 K/uL (ref 0.0–0.1)
Basophils Relative: 1 %
Eosinophils Absolute: 0.2 K/uL (ref 0.0–0.5)
Eosinophils Relative: 4 %
HCT: 50.3 % (ref 39.0–52.0)
Hemoglobin: 15.8 g/dL (ref 13.0–17.0)
Immature Granulocytes: 0 %
Lymphocytes Relative: 32 %
Lymphs Abs: 1.5 K/uL (ref 0.7–4.0)
MCH: 28 pg (ref 26.0–34.0)
MCHC: 31.4 g/dL (ref 30.0–36.0)
MCV: 89.2 fL (ref 80.0–100.0)
Monocytes Absolute: 0.3 K/uL (ref 0.1–1.0)
Monocytes Relative: 6 %
Neutro Abs: 2.7 K/uL (ref 1.7–7.7)
Neutrophils Relative %: 57 %
Platelets: 223 K/uL (ref 150–400)
RBC: 5.64 MIL/uL (ref 4.22–5.81)
RDW: 14.8 % (ref 11.5–15.5)
WBC: 4.7 K/uL (ref 4.0–10.5)
nRBC: 0 % (ref 0.0–0.2)

## 2024-01-18 LAB — LIPID PANEL
Cholesterol: 92 mg/dL (ref 0–200)
HDL: 46 mg/dL (ref >40)
LDL Cholesterol: 39 mg/dL (ref 0–99)
Total CHOL/HDL Ratio: 2 ratio
Triglycerides: 34 mg/dL (ref <150)
VLDL: 7 mg/dL (ref 0–40)

## 2024-01-18 LAB — COMPREHENSIVE METABOLIC PANEL WITH GFR
ALT: 46 U/L — ABNORMAL HIGH (ref 0–44)
AST: 62 U/L — ABNORMAL HIGH (ref 15–41)
Albumin: 4 g/dL (ref 3.5–5.0)
Alkaline Phosphatase: 65 U/L (ref 38–126)
Anion gap: 11 (ref 5–15)
BUN: 15 mg/dL (ref 8–23)
CO2: 29 mmol/L (ref 22–32)
Calcium: 9.5 mg/dL (ref 8.9–10.3)
Chloride: 99 mmol/L (ref 98–111)
Creatinine, Ser: 0.95 mg/dL (ref 0.61–1.24)
GFR, Estimated: >60 mL/min (ref >60)
Glucose, Bld: 120 mg/dL — ABNORMAL HIGH (ref 70–99)
Potassium: 3.6 mmol/L (ref 3.5–5.1)
Sodium: 139 mmol/L (ref 135–145)
Total Bilirubin: 1.2 mg/dL (ref 0.0–1.2)
Total Protein: 8.3 g/dL — ABNORMAL HIGH (ref 6.5–8.1)

## 2024-01-18 LAB — POCT URINE DRUG SCREEN - MANUAL ENTRY (I-SCREEN)
POC Amphetamine UR: NOT DETECTED
POC Buprenorphine (BUP): NOT DETECTED
POC Cocaine UR: POSITIVE — AB
POC Marijuana UR: NOT DETECTED
POC Methadone UR: NOT DETECTED
POC Methamphetamine UR: NOT DETECTED
POC Morphine: NOT DETECTED
POC Oxazepam (BZO): NOT DETECTED
POC Oxycodone UR: NOT DETECTED
POC Secobarbital (BAR): NOT DETECTED

## 2024-01-18 LAB — ETHANOL: Alcohol, Ethyl (B): <15 mg/dL (ref <15)

## 2024-01-18 LAB — TSH: TSH: 0.814 u[IU]/mL (ref 0.350–4.500)

## 2024-01-18 LAB — HEMOGLOBIN A1C
Hgb A1c MFr Bld: 5.7 % — ABNORMAL HIGH (ref 4.8–5.6)
Mean Plasma Glucose: 116.89 mg/dL

## 2024-01-18 MED ORDER — LOSARTAN POTASSIUM 50 MG PO TABS
25.0000 mg | ORAL_TABLET | Freq: Every day | ORAL | Status: DC
  Administered 2024-01-18 – 2024-01-23 (×6): 25 mg via ORAL
  Filled 2024-01-18 (×4): qty 1
  Filled 2024-01-18: qty 4
  Filled 2024-01-18 (×2): qty 1

## 2024-01-18 MED ORDER — OLANZAPINE 10 MG IM SOLR: 5.0000 mg | Freq: Three times a day (TID) | INTRAMUSCULAR | Status: DC | PRN

## 2024-01-18 MED ORDER — ACETAMINOPHEN 325 MG PO TABS: 650.0000 mg | ORAL_TABLET | Freq: Four times a day (QID) | ORAL | Status: DC | PRN

## 2024-01-18 MED ORDER — ARIPIPRAZOLE 10 MG PO TABS
10.0000 mg | ORAL_TABLET | Freq: Every day | ORAL | Status: DC
  Administered 2024-01-18 – 2024-01-22 (×3): 10 mg via ORAL
  Filled 2024-01-18 (×4): qty 1
  Filled 2024-01-18: qty 7
  Filled 2024-01-18 (×3): qty 1

## 2024-01-18 MED ORDER — DIGOXIN 125 MCG PO TABS
0.1250 mg | ORAL_TABLET | Freq: Every day | ORAL | Status: DC
Start: 2024-01-18 — End: 2024-01-23
  Administered 2024-01-18 – 2024-01-23 (×6): 0.125 mg via ORAL
  Filled 2024-01-18 (×5): qty 1
  Filled 2024-01-18: qty 7
  Filled 2024-01-18: qty 1

## 2024-01-18 MED ORDER — MAGNESIUM HYDROXIDE 400 MG/5ML PO SUSP: 30.0000 mL | Freq: Every day | ORAL | Status: DC | PRN

## 2024-01-18 MED ORDER — GABAPENTIN 400 MG PO CAPS: 400.0000 mg | ORAL_CAPSULE | Freq: Every day | ORAL | Status: DC | PRN

## 2024-01-18 MED ORDER — LORAZEPAM 1 MG PO TABS: 1.0000 mg | ORAL_TABLET | Freq: Four times a day (QID) | ORAL | Status: AC | PRN

## 2024-01-18 MED ORDER — ALUM & MAG HYDROXIDE-SIMETH 200-200-20 MG/5ML PO SUSP: 30.0000 mL | ORAL | Status: DC | PRN

## 2024-01-18 MED ORDER — TORSEMIDE 20 MG PO TABS
20.0000 mg | ORAL_TABLET | Freq: Every day | ORAL | Status: DC
Start: 2024-01-18 — End: 2024-01-23
  Administered 2024-01-18: 20 mg via ORAL
  Filled 2024-01-18 (×2): qty 1
  Filled 2024-01-18: qty 7
  Filled 2024-01-18 (×3): qty 1

## 2024-01-18 MED ORDER — ALBUTEROL SULFATE HFA 108 (90 BASE) MCG/ACT IN AERS
1.0000 | INHALATION_SPRAY | Freq: Four times a day (QID) | RESPIRATORY_TRACT | Status: DC | PRN
  Administered 2024-01-18: 2 via RESPIRATORY_TRACT
  Administered 2024-01-21: 1 via RESPIRATORY_TRACT
  Filled 2024-01-18: qty 6.7

## 2024-01-18 MED ORDER — OLANZAPINE 5 MG PO TBDP: 5.0000 mg | ORAL_TABLET | Freq: Three times a day (TID) | ORAL | Status: DC | PRN

## 2024-01-18 MED ORDER — EMPAGLIFLOZIN 10 MG PO TABS
10.0000 mg | ORAL_TABLET | Freq: Every day | ORAL | Status: DC
  Administered 2024-01-18 – 2024-01-23 (×6): 10 mg via ORAL
  Filled 2024-01-18: qty 1
  Filled 2024-01-18: qty 7
  Filled 2024-01-18 (×5): qty 1

## 2024-01-18 MED ORDER — ADULT MULTIVITAMIN W/MINERALS CH
1.0000 | ORAL_TABLET | Freq: Every day | ORAL | Status: DC
  Administered 2024-01-18: 1 via ORAL
  Filled 2024-01-18 (×5): qty 1

## 2024-01-18 MED ORDER — CITALOPRAM HYDROBROMIDE 20 MG PO TABS
20.0000 mg | ORAL_TABLET | Freq: Every day | ORAL | Status: DC
  Administered 2024-01-18: 20 mg via ORAL
  Filled 2024-01-18 (×3): qty 1

## 2024-01-18 MED ORDER — SPIRONOLACTONE 12.5 MG HALF TABLET
12.5000 mg | ORAL_TABLET | Freq: Every day | ORAL | Status: DC
  Administered 2024-01-18 – 2024-01-23 (×6): 12.5 mg via ORAL
  Filled 2024-01-18 (×2): qty 1
  Filled 2024-01-18 (×2): qty 4
  Filled 2024-01-18 (×4): qty 1

## 2024-01-18 MED ORDER — THIAMINE MONONITRATE 100 MG PO TABS
100.0000 mg | ORAL_TABLET | Freq: Every day | ORAL | Status: DC
  Administered 2024-01-22: 100 mg via ORAL
  Filled 2024-01-18 (×4): qty 1

## 2024-01-18 MED ORDER — ASPIRIN 81 MG PO TBEC
81.0000 mg | DELAYED_RELEASE_TABLET | Freq: Every day | ORAL | Status: DC
  Administered 2024-01-18 – 2024-01-23 (×6): 81 mg via ORAL
  Filled 2024-01-18 (×6): qty 1

## 2024-01-18 MED ORDER — ATORVASTATIN CALCIUM 40 MG PO TABS
80.0000 mg | ORAL_TABLET | Freq: Every evening | ORAL | Status: DC
  Administered 2024-01-18 – 2024-01-22 (×5): 80 mg via ORAL
  Filled 2024-01-18 (×2): qty 2
  Filled 2024-01-18: qty 14
  Filled 2024-01-18 (×3): qty 2

## 2024-01-18 MED ORDER — TRAZODONE HCL 50 MG PO TABS: 50.0000 mg | ORAL_TABLET | Freq: Every evening | ORAL | Status: DC | PRN

## 2024-01-18 MED ORDER — LOPERAMIDE HCL 2 MG PO CAPS: 2.0000 mg | ORAL_CAPSULE | ORAL | Status: AC | PRN

## 2024-01-18 MED ORDER — ONDANSETRON 4 MG PO TBDP: 4.0000 mg | ORAL_TABLET | Freq: Four times a day (QID) | ORAL | Status: AC | PRN

## 2024-01-18 MED ORDER — HYDROXYZINE HCL 25 MG PO TABS: 25.0000 mg | ORAL_TABLET | Freq: Four times a day (QID) | ORAL | Status: AC | PRN

## 2024-01-18 MED ORDER — RIVAROXABAN 20 MG PO TABS
20.0000 mg | ORAL_TABLET | Freq: Every day | ORAL | Status: DC
Start: 2024-01-18 — End: 2024-01-23
  Administered 2024-01-18 – 2024-01-22 (×5): 20 mg via ORAL
  Filled 2024-01-18 (×2): qty 1
  Filled 2024-01-18: qty 7
  Filled 2024-01-18 (×3): qty 1

## 2024-01-18 MED ORDER — HYDROXYZINE HCL 25 MG PO TABS: 25.0000 mg | ORAL_TABLET | Freq: Three times a day (TID) | ORAL | Status: DC | PRN

## 2024-01-18 NOTE — Discharge Instructions (Signed)
 Transfer to [facility]

## 2024-01-18 NOTE — ED Notes (Signed)
 Patient is in the bedroom calm and composed. NAD. Respirations even and unlabored.  Environment secured per policy Will monitor for safety.

## 2024-01-18 NOTE — Care Management (Signed)
 [facility] Care Management  Writer met with the patient and discussed discharge planning.  Patient requests inpatient substance abuse treatment.     Patient has been referred to [facility] and [facility].

## 2024-01-18 NOTE — Progress Notes (Signed)
   01/18/24 0929  [facility] Triage Screening (Walk-ins at [facility] only)  What Is the Reason for Your Visit/Call Today? Allen Mayer 62Y male presents to [facility] unaccompanied, voluntarily. PT is experiencing homeless. PT states he is diagnosed with schizoaffective, ptsd and severe depression. PT currently takes prescribed medications. PT states he is going through some issues and doesn't want to do anything stupid. PT denies SI, HI, AVH. PT reports to drinking alcohol and using every drug within the past 24hrs.  How Long Has This Been Causing You Problems? > than 6 months  Have You Recently Had Any Thoughts About Hurting Yourself? No  Are You Planning to Commit Suicide/Harm Yourself At This time? No  Have you Recently Had Thoughts About Hurting Someone [name]? No  Are You Planning To Harm Someone At This Time? No  Physical Abuse Denies  Verbal Abuse Denies  Sexual Abuse Denies  Exploitation of patient/patient's resources Denies  Self-Neglect Yes, present (Comment)  Are you currently experiencing any auditory, visual or other hallucinations? No  Have You Used Any Alcohol or Drugs in the Past 24 Hours? Yes  What Did You Use and How Much? everything (alcohol and illicit drugs), when asked specifically what drugs did he take pt exclaimed everything!  Do you have any current medical co-morbidities that require immediate attention?  (hypertension, congested heart failure, copd, condition where his nerves in his feet and hands tingle all the time, type 2 diabetes, deterial bone deteroiation)  Clinician description of patient physical appearance/behavior: cooperative, tangential at times  What Do You Feel Would Help You the Most Today? Alcohol or Drug Use Treatment;Treatment for Depression or other mood problem;Social Support;Housing Assistance;Food Assistance;Financial Resources;Medication(s);Transportation Assistance  Determination of Need Routine (7 days)  Options For Referral [facility] Urgent  Care;Facility-Based Crisis;Medication Management

## 2024-01-18 NOTE — ED Provider Notes (Signed)
 Behavioral Health Urgent Care Medical Screening Exam  Patient Name: Allen Mayer MRN: [id number] Date of Evaluation: 01/18/24 Chief Complaint:  I need help emotionally. Diagnosis:  Final diagnoses:  Substance abuse (HCC)  Adjustment disorder with depressed mood  Cocaine abuse (HCC)  Alcohol use    History of Present illness: Allen Mayer is a 63 year old male patient with a reported psychiatric history significant for schizoaffective disorder, PTSD and depression and a medical history of CHF, COPD, hypertension, hyperlipidemia, neuropathy, diabetes type 2 and degenerative disc disease who presented to the [facility]gent [facility] voluntary unaccompanied with complaints of worsening depression and substance abuse, stating I need help emotionally.   Patient states that he got out of jail in August 2025 and was incarcerated for 2 years for larceny. He reports worsening depression since August and describes his depressive symptoms as sadness, hopelessness, isolating, difficulty sleeping, poor appetite, decreased energy, and decreased motivation. He identifies current stressors as abusing drugs, homelessness, waiting on [employer] housing voucher from the [employer] and waiting to get approved for his disability from social security. He states that he was on disability for years prior to being incarcerated and that he went to [employer] in August and still haven't received his disability benefits. He states that he would like to stop drinking alcohol and using cocaine. He reports drinking alcohol off-and-on since he was 63 years old. He reports drinking alcohol every day since August (2025) and reports drinking a combination of liquor, wine and beer. He reports on average drinking anywhere from a mixture of a pint of liquor, 6-12 beers and 3 to 4 pints of wine. He states that he will typically consume a 1/2 pint of liquor and 3 beers or a pint of wine and a little beer in 24 hours.  He states that he last consumed alcohol yesterday, 2 bootleggers, 3 beers and a pint of liquor. He denies alcohol withdrawal symptoms. He denies a history of alcohol withdrawal, delirium tremens or seizures.  He reports a sobriety of 2 years when he was incarcerated, 5 years when he was incarcerated, and 7 years when he was incarcerated and states that he has been in and out of jail for years. He states that he is established with the [employer] in [place]. He reports a general discharge from the [employer] in 1981. He reports taking prescribed medications and states that he brought his medications with him. However, unable to read prescription labels as they appear damaged. He states that he is homeless and that he has been sleeping on the streets.  On evaluation, patient is alert and oriented x 4. Thought process is linear. Thought content is negative for SI/HI/AVH. Objectively, no signs of acute psychosis. His mood is depressed/irritable and affect is congruent. He has fair eye contact. He is casually dressed. He is cooperative and does not appear to be in acute distress.  He denies physical complaints.   Flowsheet Row ED from 01/18/2024 in [facility] ED from 01/11/2024 in [facility] Emergency Department at [facility] ED from 12/29/2023 in [facility] Emergency Department at [facility]  C-SSRS RISK CATEGORY No Risk No Risk No Risk    Psychiatric Specialty Exam  Presentation  General Appearance:Appropriate for Environment  Eye Contact:Fair  Speech:Clear and Coherent  Speech Volume:Normal  Handedness:Right   Mood and Affect  Mood: Irritable; Depressed  Affect: Congruent   Thought Process  Thought Processes: Coherent  Descriptions of Associations:Intact  Orientation:Full (Time, Place  and Person)  Thought Content:Logical  Diagnosis of Schizophrenia or Schizoaffective disorder in past: Yes   Hallucinations:None  Ideas of  Reference:None  Suicidal Thoughts:No  Homicidal Thoughts:No  Sensorium  Memory: Immediate Fair; Recent Fair; Remote Fair  Judgment: Fair  Insight: Fair   Art therapist  Concentration: Fair  Attention Span: Fair  Recall: [employer] of Knowledge: Fair  Language: Fair   Psychomotor Activity  Psychomotor Activity: Normal   Assets  Assets: Manufacturing systems engineer; Desire for Improvement   Sleep  Sleep: Poor   Physical Exam: Physical Exam Cardiovascular:     Rate and Rhythm: Normal rate.  Pulmonary:     Effort: Pulmonary effort is normal.  Musculoskeletal:        General: Normal range of motion.     Cervical back: Normal range of motion.  Neurological:     Mental Status: He is alert and oriented to person, place, and time.    Review of Systems  Constitutional: Negative.   HENT: Negative.    Eyes: Negative.   Respiratory: Negative.    Cardiovascular: Negative.   Gastrointestinal: Negative.   Musculoskeletal: Negative.   Neurological: Negative.   Psychiatric/Behavioral:  Positive for depression and substance abuse.    Blood pressure 139/86, pulse 60, temperature 97.7 F (36.5 C), temperature source Oral, resp. rate 18, SpO2 96%. There is no height or weight on file to calculate BMI.  Musculoskeletal: Strength & Muscle Tone: within normal limits Gait & Station: normal Patient leans: N/A   [facility] MSE Discharge Disposition for Follow up and Recommendations: Based on my evaluation I certify that psychiatric inpatient services furnished can reasonably be expected to improve the patient's condition which I recommend transfer to an appropriate accepting facility.   Allen Mayer is a 63 year old male patient with a reported psychiatric history significant for schizoaffective disorder, PTSD and depression and a medical history of CHF, COPD, hypertension, hyperlipidemia, neuropathy, diabetes type 2 and degenerative disc disease who presented to  the [facility] voluntary unaccompanied with complaints of worsening depression and substance abuse, stating I need help emotionally. Patient to be admitted to the facility based crisis center for mood stabilization and substance abuse.  Labs Will obtain CMP, CBC, A1c, lipid, TSH, UDS, BAL and EKG Lipid, and A1C labs are required on admission to determine baseline or changes in levels due to risk or metabolic syndrome caused by prescribed antipsychotic medications. BAL and UDS labs are required on admission to assess for substance that may contribute to psychiatric symptoms and are required for diagnosing substance use disorders.    Teresa Wyline CROME, NP 01/18/2024, 12:32 PM

## 2024-01-18 NOTE — ED Notes (Signed)
 Pt alert and orientedx3, Pt was calm, cooperative, mildly irritable. Pt was disheveled and dressed in casual clothing, with an odor, changed into scrubs, skin intact refer to skin assessment. Pt brought on to unit, oriented to unit. His judgment and insight seemed impaired. Pt's mood was depressed and flat affect. Denies SI or thoughts. Denies hallucinations and delusions  Pt Given food. safety precautions have been implemented as appropriate and envt secured.
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
## 2024-01-18 NOTE — ED Notes (Signed)
 Voluntary status.  Pt transferred from Obs to [facility] bed 153.  Guarded.  Pt reports some feelings of depression but denied current SI plan and intent,  Denied HI and A/V hallucinations. Pt reports recent release from prison and shares the stressors of trying to get everything back in order,  Pt currently un housed.  Pt reports he wants to detox from everything Cooperative with admission process Q 15 minute observations for safety continue

## 2024-01-18 NOTE — ED Notes (Signed)
 Pt dc to [facility]

## 2024-01-18 NOTE — ED Provider Notes (Signed)
 Facility Based Crisis Admission H&P  Date: 01/18/24 Patient Name: Allen Mayer MRN: [id number] Chief Complaint: I need help emotionally.  Diagnoses:  Final diagnoses:  Substance abuse (HCC)  Adjustment disorder with depressed mood  Cocaine abuse (HCC)  Alcohol use    HPI: Allen Mayer is a 63 year old male patient with a reported psychiatric history significant for schizoaffective disorder, PTSD and depression and a medical history of CHF, COPD, hypertension, hyperlipidemia, neuropathy, diabetes type 2 and degenerative disc disease who presented to the [facility] voluntary unaccompanied with complaints of worsening depression and substance abuse, stating I need help emotionally.   Patient states that he got out of jail in August 2025 and was incarcerated for 2 years for larceny. He reports worsening depression since August and describes his depressive symptoms as sadness, hopelessness, isolating, difficulty sleeping, poor appetite, decreased energy, and decreased motivation. He identifies current stressors as abusing drugs, homelessness, waiting on HUD housing voucher from the [employer] and waiting to get approved for his disability from social security. He states that he was on disability for years prior to being incarcerated and that he went to [employer] in August and still haven't received his disability benefits.   He states that he would like to stop drinking alcohol and using cocaine. He reports drinking alcohol off-and-on since he was 63 years old. He reports drinking alcohol every day since August (2025) and reports drinking a combination of liquor, wine and beer. He reports on average drinking anywhere from a mixture of a pint of liquor, 6-12 beers and 3 to 4 pints of wine. He states that he will typically consume a 1/2 pint of liquor and 3 beers or a pint of wine and a little beer in 24 hours. He states that he last consumed alcohol  yesterday, 2 bootleggers, 3 beers and a pint of liquor. He denies alcohol withdrawal symptoms. He denies a history of alcohol withdrawal, delirium tremens or seizures.  He reports a sobriety of 2 years when he was incarcerated, 5 years when he was incarcerated, and 7 years when he was incarcerated and states that he has been in and out of jail for years.  He states that he is established with the [employer] in [place]. He reports a general discharge from the [employer] in 1981.    PHQ 2-9:  Flowsheet Row ED from 01/18/2024 in [facility]  Thoughts that you would be better off dead, or of hurting yourself in some way Not at all  PHQ-9 Total Score 18    Flowsheet Row ED from 01/18/2024 in [facility] ED from 01/11/2024 in [facility] Department at [facility] ED from 12/29/2023 in [facility] Emergency Department at [facility]  C-SSRS RISK CATEGORY No Risk No Risk No Risk      Total Time spent with patient: 30 minutes  Musculoskeletal  Strength & Muscle Tone: within normal limits Gait & Station: normal Patient leans: N/A  Psychiatric Specialty Exam  Presentation General Appearance:  Appropriate for Environment  Eye Contact: Fair  Speech: Clear and Coherent  Speech Volume: Normal  Handedness: Right   Mood and Affect  Mood: Depressed  Affect: Congruent   Thought Process  Thought Processes: Coherent  Descriptions of Associations:Intact  Orientation:Full (Time, Place and Person)  Thought Content:Logical  Diagnosis of Schizophrenia or Schizoaffective disorder in past: Yes   Hallucinations:Hallucinations: None  Ideas of Reference:None  Suicidal Thoughts:Suicidal Thoughts: No  Homicidal Thoughts:Homicidal Thoughts: No   Sensorium  Memory: Immediate Good; Remote Good; Recent Good  Judgment: Fair  Insight: Fair   Chartered certified accountant: Fair  Attention  Span: Fair  Recall: [employer] of Knowledge: Fair  Language: Fair   Psychomotor Activity  Psychomotor Activity: Psychomotor Activity: Normal   Assets  Assets: Manufacturing systems engineer; Desire for Improvement; Financial Resources/Insurance   Sleep  Sleep: Sleep: Poor   Nutritional Assessment (For OBS and FBC admissions only) Has the patient had a weight loss or gain of 10 pounds or more in the last 3 months?: No Has the patient had a decrease in food intake/or appetite?: Yes Does the patient have dental problems?: Yes Does the patient have eating habits or behaviors that may be indicators of an eating disorder including binging or inducing vomiting?: No Has the patient recently lost weight without trying?: 0 Has the patient been eating poorly because of a decreased appetite?: 1 Malnutrition Screening Tool Score: 1    Physical Exam Cardiovascular:     Rate and Rhythm: Normal rate.  Pulmonary:     Effort: Pulmonary effort is normal.  Musculoskeletal:        General: Normal range of motion.     Cervical back: Normal range of motion.  Neurological:     Mental Status: He is alert and oriented to person, place, and time.    Review of Systems  Constitutional: Negative.   HENT: Negative.    Eyes: Negative.   Respiratory: Negative.    Cardiovascular: Negative.   Gastrointestinal: Negative.   Genitourinary: Negative.   Neurological: Negative.   Endo/Heme/Allergies: Negative.   Psychiatric/Behavioral:  Positive for depression and substance abuse.     Blood pressure 139/86, pulse 60, temperature 97.7 F (36.5 C), temperature source Oral, resp. rate 18, SpO2 96%. There is no height or weight on file to calculate BMI.  Past Psychiatric History: a reported history of schizoaffective disorder, PTSD and depression. No history of inpatient psychiatric hospitalizations.   Is the patient at risk to self? No  Has the patient been a risk to self in the past 6 months? No .     Has the patient been a risk to self within the distant past? No   Is the patient a risk to others? No   Has the patient been a risk to others in the past 6 months? No   Has the patient been a risk to others within the distant past? No   Past Medical History: a medical history of CHF, COPD, hypertension, hyperlipidemia, neuropathy, diabetes type 2 and degenerative disc disease  Family History: No reported history.   Social History: Single. Homelessness. Cocaine use. Alcohol use.  Last Labs:  Admission on 01/18/2024, Discharged on 01/18/2024  Component Date Value Ref Range Status   WBC 01/18/2024 4.7  4.0 - 10.5 K/uL Final   RBC 01/18/2024 5.64  4.22 - 5.81 MIL/uL Final   Hemoglobin 01/18/2024 15.8  13.0 - 17.0 g/dL Final   HCT 89/90/7974 50.3  39.0 - 52.0 % Final   MCV 01/18/2024 89.2  80.0 - 100.0 fL Final   MCH 01/18/2024 28.0  26.0 - 34.0 pg Final   MCHC 01/18/2024 31.4  30.0 - 36.0 g/dL Final   RDW 89/90/7974 14.8  11.5 - 15.5 % Final   Platelets 01/18/2024 223  150 - 400 K/uL Final   nRBC 01/18/2024 0.0  0.0 - 0.2 % Final   Neutrophils Relative % 01/18/2024 57  % Final  Neutro Abs 01/18/2024 2.7  1.7 - 7.7 K/uL Final   Lymphocytes Relative 01/18/2024 32  % Final   Lymphs Abs 01/18/2024 1.5  0.7 - 4.0 K/uL Final   Monocytes Relative 01/18/2024 6  % Final   Monocytes Absolute 01/18/2024 0.3  0.1 - 1.0 K/uL Final   Eosinophils Relative 01/18/2024 4  % Final   Eosinophils Absolute 01/18/2024 0.2  0.0 - 0.5 K/uL Final   Basophils Relative 01/18/2024 1  % Final   Basophils Absolute 01/18/2024 0.0  0.0 - 0.1 K/uL Final   Immature Granulocytes 01/18/2024 0  % Final   Abs Immature Granulocytes 01/18/2024 0.01  0.00 - 0.07 K/uL Final   Performed at [facility], [address] [place], [place] [address]   Sodium 01/18/2024 139  135 - 145 mmol/L Final   Potassium 01/18/2024 3.6  3.5 - 5.1 mmol/L Final   Chloride 01/18/2024 99  98 - 111 mmol/L Final   CO2 01/18/2024 29  22 -  32 mmol/L Final   Glucose, Bld 01/18/2024 120 (H)  70 - 99 mg/dL Final   Glucose reference range applies only to samples taken after fasting for at least 8 hours.   BUN 01/18/2024 15  8 - 23 mg/dL Final   Creatinine, Ser 01/18/2024 0.95  0.61 - 1.24 mg/dL Final   Calcium  01/18/2024 9.5  8.9 - 10.3 mg/dL Final   Total Protein 89/90/7974 8.3 (H)  6.5 - 8.1 g/dL Final   Albumin 89/90/7974 4.0  3.5 - 5.0 g/dL Final   AST 89/90/7974 62 (H)  15 - 41 U/L Final   ALT 01/18/2024 46 (H)  0 - 44 U/L Final   Alkaline Phosphatase 01/18/2024 65  38 - 126 U/L Final   Total Bilirubin 01/18/2024 1.2  0.0 - 1.2 mg/dL Final   GFR, Estimated 01/18/2024 >60  >60 mL/min Final   Comment: (NOTE) Calculated using the CKD-EPI Creatinine Equation (2021)    Anion gap 01/18/2024 11  5 - 15 Final   Performed at [facility], [address] [place], [place] [address]   Hgb A1c MFr Bld 01/18/2024 5.7 (H)  4.8 - 5.6 % Final   Comment: (NOTE) Diagnosis of Diabetes The following HbA1c ranges recommended by the American Diabetes Association (ADA) may be used as an aid in the diagnosis of diabetes mellitus.  Hemoglobin             Suggested A1C NGSP%              Diagnosis  <5.7                   Non Diabetic  5.7-6.4                Pre-Diabetic  >6.4                   Diabetic  <7.0                   Glycemic control for                       adults with diabetes.     Mean Plasma Glucose 01/18/2024 116.89  mg/dL Final   Performed at [facility], [address] [place], [place] [address]   Alcohol, Ethyl (B) 01/18/2024 <15  <15 mg/dL Final   Comment: (NOTE) For medical purposes only. Performed at [facility], [address] [place], [place] [address]    Cholesterol 01/18/2024 92  0 - 200 mg/dL Final   Triglycerides 89/90/7974 34  <150 mg/dL Final   HDL 89/90/7974 46  >40 mg/dL Final   Total CHOL/HDL Ratio 01/18/2024 2.0  RATIO Final   VLDL 01/18/2024 7  0 - 40 mg/dL Final   LDL  Cholesterol 01/18/2024 39  0 - 99 mg/dL Final   Comment:        Total Cholesterol/HDL:CHD Risk Coronary Heart Disease Risk Table                     Men   Women  1/2 Average Risk   3.4   3.3  Average Risk       5.0   4.4  2 X Average Risk   9.6   7.1  3 X Average Risk  23.4   11.0        Use the calculated Patient Ratio above and the CHD Risk Table to determine the patient's CHD Risk.        ATP III CLASSIFICATION (LDL):  <100     mg/dL   Optimal  899-870  mg/dL   Near or Above                    Optimal  130-159  mg/dL   Borderline  839-810  mg/dL   High  >809     mg/dL   Very High Performed at [facility], [address], [place], [place] [address]    TSH 01/18/2024 0.814  0.350 - 4.500 uIU/mL Final   Comment: Performed by a 3rd Generation assay with a functional sensitivity of <=0.01 uIU/mL. Performed at [facility], [address] [place], [place] [address]    POC Amphetamine UR 01/18/2024 None Detected  NONE DETECTED (Cut Off Level 1000 ng/mL) Final   POC Secobarbital (BAR) 01/18/2024 None Detected  NONE DETECTED (Cut Off Level 300 ng/mL) Final   POC Buprenorphine (BUP) 01/18/2024 None Detected  NONE DETECTED (Cut Off Level 10 ng/mL) Final   POC Oxazepam (BZO) 01/18/2024 None Detected  NONE DETECTED (Cut Off Level 300 ng/mL) Final   POC Cocaine UR 01/18/2024 Positive (A)  NONE DETECTED (Cut Off Level 300 ng/mL) Final   POC Methamphetamine UR 01/18/2024 None Detected  NONE DETECTED (Cut Off Level 1000 ng/mL) Final   POC Morphine  01/18/2024 None Detected  NONE DETECTED (Cut Off Level 300 ng/mL) Final   POC Methadone UR 01/18/2024 None Detected  NONE DETECTED (Cut Off Level 300 ng/mL) Final   POC Oxycodone  UR 01/18/2024 None Detected  NONE DETECTED (Cut Off Level 100 ng/mL) Final   POC Marijuana UR 01/18/2024 None Detected  NONE DETECTED (Cut Off Level 50 ng/mL) Final  Admission on 01/11/2024, Discharged on 01/12/2024  Component Date Value Ref Range Status    Sodium 01/11/2024 141  135 - 145 mmol/L Final   Potassium 01/11/2024 4.2  3.5 - 5.1 mmol/L Final   Chloride 01/11/2024 104  98 - 111 mmol/L Final   CO2 01/11/2024 23  22 - 32 mmol/L Final   Glucose, Bld 01/11/2024 125 (H)  70 - 99 mg/dL Final   Glucose reference range applies only to samples taken after fasting for at least 8 hours.   BUN 01/11/2024 20  8 - 23 mg/dL Final   Creatinine, Ser 01/11/2024 1.26 (H)  0.61 - 1.24 mg/dL Final   Calcium  01/11/2024 9.1  8.9 - 10.3 mg/dL  Final   GFR, Estimated 01/11/2024 >60  >60 mL/min Final   Comment: (NOTE) Calculated using the CKD-EPI Creatinine Equation (2021)    Anion gap 01/11/2024 14  5 - 15 Final   Performed at [facility], [address], [place], [place] [address]   WBC 01/11/2024 5.8  4.0 - 10.5 K/uL Final   RBC 01/11/2024 5.28  4.22 - 5.81 MIL/uL Final   Hemoglobin 01/11/2024 15.0  13.0 - 17.0 g/dL Final   HCT 89/97/7974 47.2  39.0 - 52.0 % Final   MCV 01/11/2024 89.4  80.0 - 100.0 fL Final   MCH 01/11/2024 28.4  26.0 - 34.0 pg Final   MCHC 01/11/2024 31.8  30.0 - 36.0 g/dL Final   RDW 89/97/7974 14.8  11.5 - 15.5 % Final   Platelets 01/11/2024 237  150 - 400 K/uL Final   nRBC 01/11/2024 0.0  0.0 - 0.2 % Final   Performed at [facility], [address] [place], [place] [address]   Troponin I (High Sensitivity) 01/11/2024 23 (H)  <18 ng/L Final   Comment: (NOTE) Elevated high sensitivity troponin I (hsTnI) values and significant  changes across serial measurements may suggest ACS but many other  chronic and acute conditions are known to elevate hsTnI results.  Refer to the Links section for chest pain algorithms and additional  guidance. Performed at [facility], [address], [place], [place] [address]    Troponin I (High Sensitivity) 01/11/2024 19 (H)  <18 ng/L Final   Comment: (NOTE) Elevated high sensitivity troponin I (hsTnI) values and significant  changes across serial measurements may suggest  ACS but many other  chronic and acute conditions are known to elevate hsTnI results.  Refer to the Links section for chest pain algorithms and additional  guidance. Performed at [facility], [address], [place], [place] [address]   Hospital Outpatient Visit on 01/08/2024  Component Date Value Ref Range Status   Sodium 01/08/2024 137  135 - 145 mmol/L Final   Potassium 01/08/2024 4.6  3.5 - 5.1 mmol/L Final   Chloride 01/08/2024 101  98 - 111 mmol/L Final   CO2 01/08/2024 27  22 - 32 mmol/L Final   Glucose, Bld 01/08/2024 90  70 - 99 mg/dL Final   Glucose reference range applies only to samples taken after fasting for at least 8 hours.   BUN 01/08/2024 13  8 - 23 mg/dL Final   Creatinine, Ser 01/08/2024 0.94  0.61 - 1.24 mg/dL Final   Calcium  01/08/2024 9.9  8.9 - 10.3 mg/dL Final   GFR, Estimated 01/08/2024 >60  >60 mL/min Final   Comment: (NOTE) Calculated using the CKD-EPI Creatinine Equation (2021)    Anion gap 01/08/2024 9  5 - 15 Final   Performed at [facility], [address], [place], [place] [address]   B Natriuretic Peptide 01/08/2024 27.6  0.0 - 100.0 pg/mL Final   Performed at [facility], [address], [place], [place] [address]  Admission on 12/29/2023, Discharged on 12/30/2023  Component Date Value Ref Range Status   SARS Coronavirus 2 by RT PCR 12/30/2023 NEGATIVE  NEGATIVE Final   Influenza A by PCR 12/30/2023 NEGATIVE  NEGATIVE Final   Influenza B by PCR 12/30/2023 NEGATIVE  NEGATIVE Final   Comment: (NOTE) The Xpert Xpress SARS-CoV-2/FLU/RSV plus assay is intended as an aid in the diagnosis of influenza from Nasopharyngeal swab specimens and should not be  used as a sole basis for treatment. Nasal washings and aspirates are unacceptable for Xpert Xpress SARS-CoV-2/FLU/RSV testing.  Fact Sheet for Patients: BloggerCourse.com  Fact Sheet for Healthcare  Providers: SeriousBroker.it  This test is not yet approved or cleared by the [place]  FDA and has been authorized for detection and/or diagnosis of SARS-CoV-2 by FDA under an Emergency Use Authorization (EUA). This EUA will remain in effect (meaning this test can be used) for the duration of the COVID-19 declaration under Section 564(b)(1) of the Act, 21 U.S.C. section 360bbb-3(b)(1), unless the authorization is terminated or revoked.     Resp Syncytial Virus by PCR 12/30/2023 NEGATIVE  NEGATIVE Final   Comment: (NOTE) Fact Sheet for Patients: BloggerCourse.com  Fact Sheet for Healthcare Providers: SeriousBroker.it  This test is not yet approved or cleared by the [place]  FDA and has been authorized for detection and/or diagnosis of SARS-CoV-2 by FDA under an Emergency Use Authorization (EUA). This EUA will remain in effect (meaning this test can be used) for the duration of the COVID-19 declaration under Section 564(b)(1) of the Act, 21 U.S.C. section 360bbb-3(b)(1), unless the authorization is terminated or revoked.  Performed at [facility], [address], [place], [place] [address]    Sodium 12/30/2023 142  135 - 145 mmol/L Final   Potassium 12/30/2023 4.4  3.5 - 5.1 mmol/L Final   Chloride 12/30/2023 105  98 - 111 mmol/L Final   BUN 12/30/2023 23  8 - 23 mg/dL Final   Creatinine, Ser 12/30/2023 1.20  0.61 - 1.24 mg/dL Final   Glucose, Bld 90/79/7974 126 (H)  70 - 99 mg/dL Final   Glucose reference range applies only to samples taken after fasting for at least 8 hours.   Calcium , Ion 12/30/2023 1.20  1.15 - 1.40 mmol/L Final   TCO2 12/30/2023 28  22 - 32 mmol/L Final   Hemoglobin 12/30/2023 16.0  13.0 - 17.0 g/dL Final   HCT 90/79/7974 47.0  39.0 - 52.0 % Final  Admission on 12/23/2023, Discharged on 12/25/2023  Component Date Value Ref Range Status   Sodium 12/23/2023 139  135  - 145 mmol/L Final   Potassium 12/23/2023 4.0  3.5 - 5.1 mmol/L Final   Chloride 12/23/2023 103  98 - 111 mmol/L Final   CO2 12/23/2023 25  22 - 32 mmol/L Final   Glucose, Bld 12/23/2023 100 (H)  70 - 99 mg/dL Final   Glucose reference range applies only to samples taken after fasting for at least 8 hours.   BUN 12/23/2023 17  8 - 23 mg/dL Final   Creatinine, Ser 12/23/2023 0.98  0.61 - 1.24 mg/dL Final   Calcium  12/23/2023 9.3  8.9 - 10.3 mg/dL Final   GFR, Estimated 12/23/2023 >60  >60 mL/min Final   Comment: (NOTE) Calculated using the CKD-EPI Creatinine Equation (2021)    Anion gap 12/23/2023 11  5 - 15 Final   Performed at [facility], [address], [place], [place] [address]   WBC 12/23/2023 6.2  4.0 - 10.5 K/uL Final   RBC 12/23/2023 5.30  4.22 - 5.81 MIL/uL Final   Hemoglobin 12/23/2023 15.1  13.0 - 17.0 g/dL Final   HCT 90/86/7974 48.0  39.0 - 52.0 % Final   MCV 12/23/2023 90.6  80.0 - 100.0 fL Final   MCH 12/23/2023 28.5  26.0 - 34.0 pg Final   MCHC 12/23/2023 31.5  30.0 - 36.0 g/dL Final   RDW 90/86/7974 14.6  11.5 -  15.5 % Final   Platelets 12/23/2023 239  150 - 400 K/uL Final   nRBC 12/23/2023 0.0  0.0 - 0.2 % Final   Performed at [facility], [address] [place], [place] [address]   Troponin I (High Sensitivity) 12/23/2023 19 (H)  <18 ng/L Final   Comment: (NOTE) Elevated high sensitivity troponin I (hsTnI) values and significant  changes across serial measurements may suggest ACS but many other  chronic and acute conditions are known to elevate hsTnI results.  Refer to the Links section for chest pain algorithms and additional  guidance. Performed at [facility], [address], [place], [place] [address]    Sodium 12/23/2023 141  135 - 145 mmol/L Final   Potassium 12/23/2023 3.9  3.5 - 5.1 mmol/L Final   Chloride 12/23/2023 105  98 - 111 mmol/L Final   BUN 12/23/2023 20  8 - 23 mg/dL Final   Creatinine, Ser 12/23/2023 1.00  0.61 -  1.24 mg/dL Final   Glucose, Bld 90/86/7974 100 (H)  70 - 99 mg/dL Final   Glucose reference range applies only to samples taken after fasting for at least 8 hours.   Calcium , Ion 12/23/2023 1.19  1.15 - 1.40 mmol/L Final   TCO2 12/23/2023 24  22 - 32 mmol/L Final   Hemoglobin 12/23/2023 16.7  13.0 - 17.0 g/dL Final   HCT 90/86/7974 49.0  39.0 - 52.0 % Final   Troponin I (High Sensitivity) 12/24/2023 16  <18 ng/L Final   Comment: (NOTE) Elevated high sensitivity troponin I (hsTnI) values and significant  changes across serial measurements may suggest ACS but many other  chronic and acute conditions are known to elevate hsTnI results.  Refer to the Links section for chest pain algorithms and additional  guidance. Performed at [facility], [address] [place], [place] [address]    SARS Coronavirus 2 by RT PCR 12/24/2023 NEGATIVE  NEGATIVE Final   Influenza A by PCR 12/24/2023 NEGATIVE  NEGATIVE Final   Influenza B by PCR 12/24/2023 NEGATIVE  NEGATIVE Final   Comment: (NOTE) The Xpert Xpress SARS-CoV-2/FLU/RSV plus assay is intended as an aid in the diagnosis of influenza from Nasopharyngeal swab specimens and should not be used as a sole basis for treatment. Nasal washings and aspirates are unacceptable for Xpert Xpress SARS-CoV-2/FLU/RSV testing.  Fact Sheet for Patients: BloggerCourse.com  Fact Sheet for Healthcare Providers: SeriousBroker.it  This test is not yet approved or cleared by the [place]  FDA and has been authorized for detection and/or diagnosis of SARS-CoV-2 by FDA under an Emergency Use Authorization (EUA). This EUA will remain in effect (meaning this test can be used) for the duration of the COVID-19 declaration under Section 564(b)(1) of the Act, 21 U.S.C. section 360bbb-3(b)(1), unless the authorization is terminated or revoked.     Resp Syncytial Virus by PCR 12/24/2023 NEGATIVE  NEGATIVE  Final   Comment: (NOTE) Fact Sheet for Patients: BloggerCourse.com  Fact Sheet for Healthcare Providers: SeriousBroker.it  This test is not yet approved or cleared by the [place]  FDA and has been authorized for detection and/or diagnosis of SARS-CoV-2 by FDA under an Emergency Use Authorization (EUA). This EUA will remain in effect (meaning this test can be used) for the duration of the COVID-19 declaration under Section 564(b)(1) of the Act, 21 U.S.C. section 360bbb-3(b)(1), unless the authorization is terminated or revoked.  Performed at [facility], [address], [place], [place] [address]  B Natriuretic Peptide 12/24/2023 11.7  0.0 - 100.0 pg/mL Final   Performed at [facility], [address], [place], [place] [address]   Opiates 12/24/2023 NONE DETECTED  NONE DETECTED Final   Cocaine 12/24/2023 POSITIVE (A)  NONE DETECTED Final   Benzodiazepines 12/24/2023 NONE DETECTED  NONE DETECTED Final   Amphetamines 12/24/2023 NONE DETECTED  NONE DETECTED Final   Tetrahydrocannabinol 12/24/2023 NONE DETECTED  NONE DETECTED Final   Barbiturates 12/24/2023 NONE DETECTED  NONE DETECTED Final   Comment: (NOTE) DRUG SCREEN FOR MEDICAL PURPOSES ONLY.  IF CONFIRMATION IS NEEDED FOR ANY PURPOSE, NOTIFY LAB WITHIN 5 DAYS.  LOWEST DETECTABLE LIMITS FOR URINE DRUG SCREEN Drug Class                     Cutoff (ng/mL) Amphetamine and metabolites    1000 Barbiturate and metabolites    200 Benzodiazepine                 200 Opiates and metabolites        300 Cocaine and metabolites        300 THC                            50 Performed at [facility], [address] [place], [place] [address]    HIV Screen 4th Generation wRfx 12/24/2023 Non Reactive  Non Reactive Final   Performed at [facility], [address], [place], [place] [address]   Weight 12/24/2023 2,504.43  oz Final   Height 12/24/2023 72   in Final   BP 12/24/2023 151/88  mmHg Final   S' Lateral 12/24/2023 4.10  cm Final   Area-P 1/2 12/24/2023 2.91  cm2 Final   Est EF 12/24/2023 35 - 40%   Final   Digoxin  Level 12/24/2023 <0.6 (L)  0.8 - 2.0 ng/mL Final   Comment: RESULT CONFIRMED BY MANUAL DILUTION Performed at [facility], [address] [place], [place] [address]    WBC 12/25/2023 5.9  4.0 - 10.5 K/uL Final   RBC 12/25/2023 5.73  4.22 - 5.81 MIL/uL Final   Hemoglobin 12/25/2023 16.2  13.0 - 17.0 g/dL Final   HCT 90/84/7974 50.8  39.0 - 52.0 % Final   MCV 12/25/2023 88.7  80.0 - 100.0 fL Final   MCH 12/25/2023 28.3  26.0 - 34.0 pg Final   MCHC 12/25/2023 31.9  30.0 - 36.0 g/dL Final   RDW 90/84/7974 14.6  11.5 - 15.5 % Final   Platelets 12/25/2023 243  150 - 400 K/uL Final   nRBC 12/25/2023 0.0  0.0 - 0.2 % Final   Performed at [facility], [address] [place], [place] [address]   Sodium 12/25/2023 139  135 - 145 mmol/L Final   Potassium 12/25/2023 3.5  3.5 - 5.1 mmol/L Final   Chloride 12/25/2023 101  98 - 111 mmol/L Final   CO2 12/25/2023 26  22 - 32 mmol/L Final   Glucose, Bld 12/25/2023 109 (H)  70 - 99 mg/dL Final   Glucose reference range applies only to samples taken after fasting for at least 8 hours.   BUN 12/25/2023 22  8 - 23 mg/dL Final   Creatinine, Ser 12/25/2023 1.32 (H)  0.61 - 1.24 mg/dL Final   Calcium  12/25/2023 8.9  8.9 - 10.3 mg/dL Final   GFR, Estimated 12/25/2023 >60  >60 mL/min Final  Comment: (NOTE) Calculated using the CKD-EPI Creatinine Equation (2021)    Anion gap 12/25/2023 12  5 - 15 Final   Performed at [facility], [address] [place], [place] [address]    Allergies: Flexeril  [cyclobenzaprine ]  Medications:  Facility Ordered Medications  Medication   acetaminophen  (TYLENOL ) tablet 650 mg   alum & mag hydroxide-simeth (MAALOX/MYLANTA) 200-200-20 MG/5ML suspension 30 mL   magnesium  hydroxide (MILK OF MAGNESIA) suspension 30 mL   OLANZapine  zydis (ZYPREXA) disintegrating tablet 5 mg   OLANZapine (ZYPREXA) injection 5 mg   albuterol  (VENTOLIN  HFA) 108 (90 Base) MCG/ACT inhaler 1-2 puff   ARIPiprazole  (ABILIFY ) tablet 10 mg   aspirin  EC tablet 81 mg   atorvastatin  (LIPITOR ) tablet 80 mg   citalopram  (CELEXA ) tablet 20 mg   digoxin  (LANOXIN ) tablet 0.125 mg   empagliflozin  (JARDIANCE ) tablet 10 mg   gabapentin  (NEURONTIN ) capsule 400 mg   losartan  (COZAAR ) tablet 25 mg   rivaroxaban  (XARELTO ) tablet 20 mg   spironolactone  (ALDACTONE ) tablet 12.5 mg   torsemide  (DEMADEX ) tablet 20 mg   PTA Medications  Medication Sig   ARIPiprazole  (ABILIFY ) 10 MG tablet Take 1 tablet (10 mg total) by mouth daily.   citalopram  (CELEXA ) 20 MG tablet Take 1 tablet (20 mg total) by mouth daily.   gabapentin  (NEURONTIN ) 400 MG capsule Take 1 capsule (400 mg total) by mouth daily as needed (nerve pain). (Patient taking differently: Take 400 mg by mouth 2 (two) times daily as needed (nerve pain).)   albuterol  (VENTOLIN  HFA) 108 (90 Base) MCG/ACT inhaler Inhale 1-2 puffs into the lungs every 6 (six) hours as needed for wheezing or shortness of breath.   rivaroxaban  (XARELTO ) 20 MG TABS tablet Take 1 tablet (20 mg total) by mouth daily with supper.   cholecalciferol  (VITAMIN D3) 25 MCG (1000 UNIT) tablet Take 5 tablets (5,000 Units total) by mouth daily.   torsemide  (DEMADEX ) 20 MG tablet Take 1 tablet (20 mg total) by mouth daily.   spironolactone  (ALDACTONE ) 25 MG tablet Take 1 tablet (25 mg total) by mouth daily.   losartan  (COZAAR ) 25 MG tablet Take 1 tablet (25 mg total) by mouth daily.   empagliflozin  (JARDIANCE ) 10 MG TABS tablet Take 1 tablet (10 mg total) by mouth daily.   digoxin  (LANOXIN ) 0.125 MG tablet Take 1 tablet (0.125 mg total) by mouth daily.   atorvastatin  (LIPITOR ) 80 MG tablet Take 1 tablet (80 mg total) by mouth daily.   aspirin  EC 81 MG tablet Take 1 tablet (81 mg total) by mouth daily. Swallow whole.   meloxicam  (MOBIC ) 7.5 MG  tablet Take 1 tablet (7.5 mg total) by mouth daily. (Patient not taking: No sig reported)    Long Term Goals: Improvement in symptoms so as ready for discharge  Short Term Goals: Patient will verbalize feelings in meetings with treatment team members., Patient will attend at least of 50% of the groups daily., Pt will complete the PHQ9 on admission, day 3 and discharge., Patient will score a low risk of violence for 24 hours prior to discharge, and Patient will take medications as prescribed daily.  Medical Decision Making  Allen Mayer is a 63 year old male patient with a reported psychiatric history significant for schizoaffective disorder, PTSD and depression and a medical history of CHF, COPD, hypertension, hyperlipidemia, neuropathy, diabetes type 2 and degenerative disc disease who presented to the [facility] voluntary unaccompanied with complaints of worsening depression and substance abuse, stating I need  help emotionally. Patient to be admitted to the facility-based crisis center for mood stabilization and substance abuse treatment.   Labs UDS positive for cocaine. BAL negative   Home medications verified by pharmacy, and patient (using last hospital d/c summary 12/23/23) Continue Abilify  10 mg by mouth daily for mood stabilization and hx of schizoaffective disorder Continue Celexa  20 mg daily for depression Continue albuterol  1 to 2 puffs every 6 hours as needed for shortness of breath/asthma Continue aspirin  81 mg p.o. daily for extensive cardiac history Continue digoxin  0.125 mg p.o. daily for history of CHF Continue losartan  25 mg p.o. daily for history of hypertension Continue Xarelto  20 mg p.o. daily for cardiac history/prevent clots  Continue spironolactone  12.5 mg daily for cardiac history/CHF Continue torsemide  20 mg daily for cardiac history/CHF Continue atorvastatin  80 mg daily for hyperlipidemia Continue Jardiance  10 mg p.o. daily  for history of diabetes type 2 Add ativan  1 mg every 6 hours prn for CIWA greater than 10  Screening Tools Add CIWA to monitor of alcohol withdrawal   Disposition, pending, patient is interested in residential substance abuse rehabilitation.   Recommendations  Based on my evaluation the patient does not appear to have an emergency medical condition.  Teresa Wyline CROME, NP

## 2024-01-18 NOTE — ED Provider Notes (Deleted)
 [facility] Continuous Assessment Admission H&P  Date: 01/18/24 Patient Name: Allen Mayer MRN: [id number] Chief Complaint: I need help emotionally.   Diagnoses:  Final diagnoses:  Substance abuse (HCC)  Adjustment disorder with depressed mood    HPI: Allen Mayer is a 63 year old male patient with a reported psychiatric history significant for schizoaffective disorder, PTSD and depression and a medical history of CHF, COPD, hypertension, hyperlipidemia, neuropathy, diabetes type 2 and degenerative disc disease who presented to the [facility] voluntary unaccompanied with complaints of worsening depression and substance abuse, stating I need help emotionally.   Patient states that he got out of jail in August 2025 and was incarcerated for 2 years for larceny. He reports worsening depression since August and describes his depressive symptoms as sadness, hopelessness, isolating, difficulty sleeping, poor appetite, decreased energy, and decreased motivation. He identifies current stressors as abusing drugs, homelessness, waiting on [employer] housing voucher from the [employer] and waiting to get approved for his disability from social security. He states that he was on disability for years prior to being incarcerated and that he went to [employer] in August and still haven't received his disability benefits.   He states that he would like to stop drinking alcohol and using cocaine. He reports drinking alcohol off-and-on since he was 63 years old. He reports drinking alcohol every day since August (2025) and reports drinking a combination of liquor, wine and beer. He reports on average drinking anywhere from a mixture of a pint of liquor, 6-12 beers and 3 to 4 pints of wine. He states that he will typically consume a 1/2 pint of liquor and 3 beers or a pint of wine and a little beer in 24 hours. He states that he last consumed alcohol yesterday, 2 bootleggers, 3  beers and a pint of liquor. He denies alcohol withdrawal symptoms. He denies a history of alcohol withdrawal, delirium tremens or seizures.  He reports a sobriety of 2 years when he was incarcerated, 5 years when he was incarcerated, and 7 years when he was incarcerated and states that he has been in and out of jail for years.  He states that he is established with the [employer] in [place]. He reports a general discharge from the [employer] in 1981. He reports taking prescribed medications and states that he brought his medications with him. However, unable to read prescription labels as they appear damaged. He states that he is homeless and that he has been sleeping on the streets.   Total Time spent with patient: 45 minutes  Musculoskeletal  Strength & Muscle Tone: within normal limits Gait & Station: normal Patient leans: N/A  Psychiatric Specialty Exam  Presentation General Appearance:  Appropriate for Environment  Eye Contact: Fair  Speech: Clear and Coherent  Speech Volume: Normal  Handedness: Right   Mood and Affect  Mood: Irritable; Depressed  Affect: Congruent   Thought Process  Thought Processes: Coherent  Descriptions of Associations:Intact  Orientation:Full (Time, Place and Person)  Thought Content:Logical    Hallucinations:Hallucinations: None  Ideas of Reference:None  Suicidal Thoughts:Suicidal Thoughts: No  Homicidal Thoughts:No data recorded  Sensorium  Memory: Immediate Fair; Recent Fair; Remote Fair  Judgment: Fair  Insight: Fair   Art therapist  Concentration: Fair  Attention Span: Fair  Recall: [employer] of Knowledge: Fair  Language: Fair   Psychomotor Activity  Psychomotor Activity: Psychomotor Activity: Normal   Assets  Assets: Communication Skills; Desire for Improvement   Sleep  Sleep: Sleep: Poor   Nutritional Assessment (For OBS and FBC admissions only) Has the patient had a weight loss or  gain of 10 pounds or more in the last 3 months?: No Has the patient had a decrease in food intake/or appetite?: Yes Does the patient have dental problems?: Yes Does the patient have eating habits or behaviors that may be indicators of an eating disorder including binging or inducing vomiting?: No Has the patient recently lost weight without trying?: 0 Has the patient been eating poorly because of a decreased appetite?: 1 Malnutrition Screening Tool Score: 1    Physical Exam Cardiovascular:     Rate and Rhythm: Normal rate.  Pulmonary:     Effort: Pulmonary effort is normal.  Musculoskeletal:        General: Normal range of motion.     Cervical back: Normal range of motion.  Neurological:     Mental Status: He is alert and oriented to person, place, and time.    Review of Systems  Constitutional: Negative.   HENT: Negative.    Eyes: Negative.   Respiratory: Negative.    Cardiovascular: Negative.   Gastrointestinal: Negative.   Genitourinary: Negative.   Musculoskeletal: Negative.   Neurological: Negative.   Psychiatric/Behavioral:  Positive for depression and substance abuse.     Blood pressure 139/86, pulse 60, temperature 97.7 F (36.5 C), temperature source Oral, resp. rate 18, SpO2 96%. There is no height or weight on file to calculate BMI.  Past Psychiatric History:   Is the patient at risk to self? No  Has the patient been a risk to self in the past 6 months? No .    Has the patient been a risk to self within the distant past? No   Is the patient a risk to others? No   Has the patient been a risk to others in the past 6 months? No   Has the patient been a risk to others within the distant past? No   Past Medical History:   Family History:   Social History:   Last Labs:  Admission on 01/18/2024  Component Date Value Ref Range Status   POC Amphetamine UR 01/18/2024 None Detected  NONE DETECTED (Cut Off Level 1000 ng/mL) Final   POC Secobarbital (BAR)  01/18/2024 None Detected  NONE DETECTED (Cut Off Level 300 ng/mL) Final   POC Buprenorphine (BUP) 01/18/2024 None Detected  NONE DETECTED (Cut Off Level 10 ng/mL) Final   POC Oxazepam (BZO) 01/18/2024 None Detected  NONE DETECTED (Cut Off Level 300 ng/mL) Final   POC Cocaine UR 01/18/2024 Positive (A)  NONE DETECTED (Cut Off Level 300 ng/mL) Final   POC Methamphetamine UR 01/18/2024 None Detected  NONE DETECTED (Cut Off Level 1000 ng/mL) Final   POC Morphine  01/18/2024 None Detected  NONE DETECTED (Cut Off Level 300 ng/mL) Final   POC Methadone UR 01/18/2024 None Detected  NONE DETECTED (Cut Off Level 300 ng/mL) Final   POC Oxycodone  UR 01/18/2024 None Detected  NONE DETECTED (Cut Off Level 100 ng/mL) Final   POC Marijuana UR 01/18/2024 None Detected  NONE DETECTED (Cut Off Level 50 ng/mL) Final  Admission on 01/11/2024, Discharged on 01/12/2024  Component Date Value Ref Range Status   Sodium 01/11/2024 141  135 - 145 mmol/L Final   Potassium 01/11/2024 4.2  3.5 - 5.1 mmol/L Final   Chloride 01/11/2024 104  98 - 111 mmol/L Final   CO2 01/11/2024 23  22 - 32 mmol/L Final   Glucose,  Bld 01/11/2024 125 (H)  70 - 99 mg/dL Final   Glucose reference range applies only to samples taken after fasting for at least 8 hours.   BUN 01/11/2024 20  8 - 23 mg/dL Final   Creatinine, Ser 01/11/2024 1.26 (H)  0.61 - 1.24 mg/dL Final   Calcium  01/11/2024 9.1  8.9 - 10.3 mg/dL Final   GFR, Estimated 01/11/2024 >60  >60 mL/min Final   Comment: (NOTE) Calculated using the CKD-EPI Creatinine Equation (2021)    Anion gap 01/11/2024 14  5 - 15 Final   Performed at [facility] Lab, [address] [place], [place] [address]   WBC 01/11/2024 5.8  4.0 - 10.5 K/uL Final   RBC 01/11/2024 5.28  4.22 - 5.81 MIL/uL Final   Hemoglobin 01/11/2024 15.0  13.0 - 17.0 g/dL Final   HCT 89/97/7974 47.2  39.0 - 52.0 % Final   MCV 01/11/2024 89.4  80.0 - 100.0 fL Final   MCH 01/11/2024 28.4  26.0 - 34.0 pg Final   MCHC  01/11/2024 31.8  30.0 - 36.0 g/dL Final   RDW 89/97/7974 14.8  11.5 - 15.5 % Final   Platelets 01/11/2024 237  150 - 400 K/uL Final   nRBC 01/11/2024 0.0  0.0 - 0.2 % Final   Performed at [facility], [address], [place], [place] [address]   Troponin I (High Sensitivity) 01/11/2024 23 (H)  <18 ng/L Final   Comment: (NOTE) Elevated high sensitivity troponin I (hsTnI) values and significant  changes across serial measurements may suggest ACS but many other  chronic and acute conditions are known to elevate hsTnI results.  Refer to the Links section for chest pain algorithms and additional  guidance. Performed at [facility], [address] [place], [place] [address]    Troponin I (High Sensitivity) 01/11/2024 19 (H)  <18 ng/L Final   Comment: (NOTE) Elevated high sensitivity troponin I (hsTnI) values and significant  changes across serial measurements may suggest ACS but many other  chronic and acute conditions are known to elevate hsTnI results.  Refer to the Links section for chest pain algorithms and additional  guidance. Performed at [facility], [address] [place], [place] [address]   Hospital Outpatient Visit on 01/08/2024  Component Date Value Ref Range Status   Sodium 01/08/2024 137  135 - 145 mmol/L Final   Potassium 01/08/2024 4.6  3.5 - 5.1 mmol/L Final   Chloride 01/08/2024 101  98 - 111 mmol/L Final   CO2 01/08/2024 27  22 - 32 mmol/L Final   Glucose, Bld 01/08/2024 90  70 - 99 mg/dL Final   Glucose reference range applies only to samples taken after fasting for at least 8 hours.   BUN 01/08/2024 13  8 - 23 mg/dL Final   Creatinine, Ser 01/08/2024 0.94  0.61 - 1.24 mg/dL Final   Calcium  01/08/2024 9.9  8.9 - 10.3 mg/dL Final   GFR, Estimated 01/08/2024 >60  >60 mL/min Final   Comment: (NOTE) Calculated using the CKD-EPI Creatinine Equation (2021)    Anion gap 01/08/2024 9  5 - 15 Final   Performed at [facility], [address] [place], [place] [address]   B Natriuretic Peptide 01/08/2024 27.6  0.0 - 100.0 pg/mL Final   Performed at [facility], [address] [place], [place] [address]  Admission on 12/29/2023, Discharged on 12/30/2023  Component Date Value Ref Range Status  SARS Coronavirus 2 by RT PCR 12/30/2023 NEGATIVE  NEGATIVE Final   Influenza A by PCR 12/30/2023 NEGATIVE  NEGATIVE Final   Influenza B by PCR 12/30/2023 NEGATIVE  NEGATIVE Final   Comment: (NOTE) The Xpert Xpress SARS-CoV-2/FLU/RSV plus assay is intended as an aid in the diagnosis of influenza from Nasopharyngeal swab specimens and should not be used as a sole basis for treatment. Nasal washings and aspirates are unacceptable for Xpert Xpress SARS-CoV-2/FLU/RSV testing.  Fact Sheet for Patients: BloggerCourse.com  Fact Sheet for Healthcare Providers: SeriousBroker.it  This test is not yet approved or cleared by the [place]  FDA and has been authorized for detection and/or diagnosis of SARS-CoV-2 by FDA under an Emergency Use Authorization (EUA). This EUA will remain in effect (meaning this test can be used) for the duration of the COVID-19 declaration under Section 564(b)(1) of the Act, 21 U.S.C. section 360bbb-3(b)(1), unless the authorization is terminated or revoked.     Resp Syncytial Virus by PCR 12/30/2023 NEGATIVE  NEGATIVE Final   Comment: (NOTE) Fact Sheet for Patients: BloggerCourse.com  Fact Sheet for Healthcare Providers: SeriousBroker.it  This test is not yet approved or cleared by the [place]  FDA and has been authorized for detection and/or diagnosis of SARS-CoV-2 by FDA under an Emergency Use Authorization (EUA). This EUA will remain in effect (meaning this test can be used) for the duration of the COVID-19 declaration under Section 564(b)(1) of the Act, 21 U.S.C. section 360bbb-3(b)(1),  unless the authorization is terminated or revoked.  Performed at [facility], [address], [place], [place] [address]    Sodium 12/30/2023 142  135 - 145 mmol/L Final   Potassium 12/30/2023 4.4  3.5 - 5.1 mmol/L Final   Chloride 12/30/2023 105  98 - 111 mmol/L Final   BUN 12/30/2023 23  8 - 23 mg/dL Final   Creatinine, Ser 12/30/2023 1.20  0.61 - 1.24 mg/dL Final   Glucose, Bld 90/79/7974 126 (H)  70 - 99 mg/dL Final   Glucose reference range applies only to samples taken after fasting for at least 8 hours.   Calcium , Ion 12/30/2023 1.20  1.15 - 1.40 mmol/L Final   TCO2 12/30/2023 28  22 - 32 mmol/L Final   Hemoglobin 12/30/2023 16.0  13.0 - 17.0 g/dL Final   HCT 90/79/7974 47.0  39.0 - 52.0 % Final  Admission on 12/23/2023, Discharged on 12/25/2023  Component Date Value Ref Range Status   Sodium 12/23/2023 139  135 - 145 mmol/L Final   Potassium 12/23/2023 4.0  3.5 - 5.1 mmol/L Final   Chloride 12/23/2023 103  98 - 111 mmol/L Final   CO2 12/23/2023 25  22 - 32 mmol/L Final   Glucose, Bld 12/23/2023 100 (H)  70 - 99 mg/dL Final   Glucose reference range applies only to samples taken after fasting for at least 8 hours.   BUN 12/23/2023 17  8 - 23 mg/dL Final   Creatinine, Ser 12/23/2023 0.98  0.61 - 1.24 mg/dL Final   Calcium  12/23/2023 9.3  8.9 - 10.3 mg/dL Final   GFR, Estimated 12/23/2023 >60  >60 mL/min Final   Comment: (NOTE) Calculated using the CKD-EPI Creatinine Equation (2021)    Anion gap 12/23/2023 11  5 - 15 Final   Performed at [facility], [address] [place], [place] [address]   WBC 12/23/2023 6.2  4.0 - 10.5 K/uL Final   RBC 12/23/2023 5.30  4.22 - 5.81 MIL/uL Final   Hemoglobin  12/23/2023 15.1  13.0 - 17.0 g/dL Final   HCT 90/86/7974 48.0  39.0 - 52.0 % Final   MCV 12/23/2023 90.6  80.0 - 100.0 fL Final   MCH 12/23/2023 28.5  26.0 - 34.0 pg Final   MCHC 12/23/2023 31.5  30.0 - 36.0 g/dL Final   RDW 90/86/7974 14.6  11.5 - 15.5 % Final    Platelets 12/23/2023 239  150 - 400 K/uL Final   nRBC 12/23/2023 0.0  0.0 - 0.2 % Final   Performed at [facility], [address], [place], [place] [address]   Troponin I (High Sensitivity) 12/23/2023 19 (H)  <18 ng/L Final   Comment: (NOTE) Elevated high sensitivity troponin I (hsTnI) values and significant  changes across serial measurements may suggest ACS but many other  chronic and acute conditions are known to elevate hsTnI results.  Refer to the Links section for chest pain algorithms and additional  guidance. Performed at [facility], [address], [place], [place] [address]    Sodium 12/23/2023 141  135 - 145 mmol/L Final   Potassium 12/23/2023 3.9  3.5 - 5.1 mmol/L Final   Chloride 12/23/2023 105  98 - 111 mmol/L Final   BUN 12/23/2023 20  8 - 23 mg/dL Final   Creatinine, Ser 12/23/2023 1.00  0.61 - 1.24 mg/dL Final   Glucose, Bld 90/86/7974 100 (H)  70 - 99 mg/dL Final   Glucose reference range applies only to samples taken after fasting for at least 8 hours.   Calcium , Ion 12/23/2023 1.19  1.15 - 1.40 mmol/L Final   TCO2 12/23/2023 24  22 - 32 mmol/L Final   Hemoglobin 12/23/2023 16.7  13.0 - 17.0 g/dL Final   HCT 90/86/7974 49.0  39.0 - 52.0 % Final   Troponin I (High Sensitivity) 12/24/2023 16  <18 ng/L Final   Comment: (NOTE) Elevated high sensitivity troponin I (hsTnI) values and significant  changes across serial measurements may suggest ACS but many other  chronic and acute conditions are known to elevate hsTnI results.  Refer to the Links section for chest pain algorithms and additional  guidance. Performed at [facility], [address], [place], [place] [address]    SARS Coronavirus 2 by RT PCR 12/24/2023 NEGATIVE  NEGATIVE Final   Influenza A by PCR 12/24/2023 NEGATIVE  NEGATIVE Final   Influenza B by PCR 12/24/2023 NEGATIVE  NEGATIVE Final   Comment: (NOTE) The Xpert Xpress SARS-CoV-2/FLU/RSV plus assay is intended as an aid in  the diagnosis of influenza from Nasopharyngeal swab specimens and should not be used as a sole basis for treatment. Nasal washings and aspirates are unacceptable for Xpert Xpress SARS-CoV-2/FLU/RSV testing.  Fact Sheet for Patients: BloggerCourse.com  Fact Sheet for Healthcare Providers: SeriousBroker.it  This test is not yet approved or cleared by the [place]  FDA and has been authorized for detection and/or diagnosis of SARS-CoV-2 by FDA under an Emergency Use Authorization (EUA). This EUA will remain in effect (meaning this test can be used) for the duration of the COVID-19 declaration under Section 564(b)(1) of the Act, 21 U.S.C. section 360bbb-3(b)(1), unless the authorization is terminated or revoked.     Resp Syncytial Virus by PCR 12/24/2023 NEGATIVE  NEGATIVE Final   Comment: (NOTE) Fact Sheet for Patients: BloggerCourse.com  Fact Sheet for Healthcare Providers: SeriousBroker.it  This test is not yet approved or cleared by the [place]  FDA and has been authorized for detection and/or diagnosis of SARS-CoV-2  by FDA under an Emergency Use Authorization (EUA). This EUA will remain in effect (meaning this test can be used) for the duration of the COVID-19 declaration under Section 564(b)(1) of the Act, 21 U.S.C. section 360bbb-3(b)(1), unless the authorization is terminated or revoked.  Performed at [facility], [address], [place], [place] [address]    B Natriuretic Peptide 12/24/2023 11.7  0.0 - 100.0 pg/mL Final   Performed at [facility], [address] [place], [place] [address]   Opiates 12/24/2023 NONE DETECTED  NONE DETECTED Final   Cocaine 12/24/2023 POSITIVE (A)  NONE DETECTED Final   Benzodiazepines 12/24/2023 NONE DETECTED  NONE DETECTED Final   Amphetamines 12/24/2023 NONE DETECTED  NONE DETECTED Final   Tetrahydrocannabinol  12/24/2023 NONE DETECTED  NONE DETECTED Final   Barbiturates 12/24/2023 NONE DETECTED  NONE DETECTED Final   Comment: (NOTE) DRUG SCREEN FOR MEDICAL PURPOSES ONLY.  IF CONFIRMATION IS NEEDED FOR ANY PURPOSE, NOTIFY LAB WITHIN 5 DAYS.  LOWEST DETECTABLE LIMITS FOR URINE DRUG SCREEN Drug Class                     Cutoff (ng/mL) Amphetamine and metabolites    1000 Barbiturate and metabolites    200 Benzodiazepine                 200 Opiates and metabolites        300 Cocaine and metabolites        300 THC                            50 Performed at [facility], [address], [place], [place] [address]    HIV Screen 4th Generation wRfx 12/24/2023 Non Reactive  Non Reactive Final   Performed at [facility], [address], [place], [place] [address]   Weight 12/24/2023 2,504.43  oz Final   Height 12/24/2023 72  in Final   BP 12/24/2023 151/88  mmHg Final   S' Lateral 12/24/2023 4.10  cm Final   Area-P 1/2 12/24/2023 2.91  cm2 Final   Est EF 12/24/2023 35 - 40%   Final   Digoxin  Level 12/24/2023 <0.6 (L)  0.8 - 2.0 ng/mL Final   Comment: RESULT CONFIRMED BY MANUAL DILUTION Performed at [facility], [address] [place], [place] [address]    WBC 12/25/2023 5.9  4.0 - 10.5 K/uL Final   RBC 12/25/2023 5.73  4.22 - 5.81 MIL/uL Final   Hemoglobin 12/25/2023 16.2  13.0 - 17.0 g/dL Final   HCT 90/84/7974 50.8  39.0 - 52.0 % Final   MCV 12/25/2023 88.7  80.0 - 100.0 fL Final   MCH 12/25/2023 28.3  26.0 - 34.0 pg Final   MCHC 12/25/2023 31.9  30.0 - 36.0 g/dL Final   RDW 90/84/7974 14.6  11.5 - 15.5 % Final   Platelets 12/25/2023 243  150 - 400 K/uL Final   nRBC 12/25/2023 0.0  0.0 - 0.2 % Final   Performed at [facility], [address], [place], [place] [address]   Sodium 12/25/2023 139  135 - 145 mmol/L Final   Potassium 12/25/2023 3.5  3.5 - 5.1 mmol/L Final   Chloride 12/25/2023 101  98 - 111 mmol/L Final   CO2 12/25/2023 26  22 - 32 mmol/L Final    Glucose, Bld 12/25/2023 109 (H)  70 - 99 mg/dL Final  Glucose reference range applies only to samples taken after fasting for at least 8 hours.   BUN 12/25/2023 22  8 - 23 mg/dL Final   Creatinine, Ser 12/25/2023 1.32 (H)  0.61 - 1.24 mg/dL Final   Calcium  12/25/2023 8.9  8.9 - 10.3 mg/dL Final   GFR, Estimated 12/25/2023 >60  >60 mL/min Final   Comment: (NOTE) Calculated using the CKD-EPI Creatinine Equation (2021)    Anion gap 12/25/2023 12  5 - 15 Final   Performed at [facility], [address], [place], [place] [address]    Allergies: Flexeril  [cyclobenzaprine ]  Medications:  Facility Ordered Medications  Medication   acetaminophen  (TYLENOL ) tablet 650 mg   alum & mag hydroxide-simeth (MAALOX/MYLANTA) 200-200-20 MG/5ML suspension 30 mL   magnesium  hydroxide (MILK OF MAGNESIA) suspension 30 mL   OLANZapine zydis (ZYPREXA) disintegrating tablet 5 mg   OLANZapine (ZYPREXA) injection 5 mg   hydrOXYzine (ATARAX) tablet 25 mg   traZODone  (DESYREL ) tablet 50 mg   PTA Medications  Medication Sig   ARIPiprazole  (ABILIFY ) 10 MG tablet Take 1 tablet (10 mg total) by mouth daily.   citalopram  (CELEXA ) 20 MG tablet Take 1 tablet (20 mg total) by mouth daily.   gabapentin  (NEURONTIN ) 400 MG capsule Take 1 capsule (400 mg total) by mouth daily as needed (nerve pain). (Patient taking differently: Take 400 mg by mouth 2 (two) times daily as needed (nerve pain).)   albuterol  (VENTOLIN  HFA) 108 (90 Base) MCG/ACT inhaler Inhale 1-2 puffs into the lungs every 6 (six) hours as needed for wheezing or shortness of breath.   rivaroxaban  (XARELTO ) 20 MG TABS tablet Take 1 tablet (20 mg total) by mouth daily with supper.   cholecalciferol  (VITAMIN D3) 25 MCG (1000 UNIT) tablet Take 5 tablets (5,000 Units total) by mouth daily.   torsemide  (DEMADEX ) 20 MG tablet Take 1 tablet (20 mg total) by mouth daily.   spironolactone  (ALDACTONE ) 25 MG tablet Take 1 tablet (25 mg total) by mouth daily.    losartan  (COZAAR ) 25 MG tablet Take 1 tablet (25 mg total) by mouth daily.   empagliflozin  (JARDIANCE ) 10 MG TABS tablet Take 1 tablet (10 mg total) by mouth daily.   digoxin  (LANOXIN ) 0.125 MG tablet Take 1 tablet (0.125 mg total) by mouth daily.   atorvastatin  (LIPITOR ) 80 MG tablet Take 1 tablet (80 mg total) by mouth daily.   aspirin  EC 81 MG tablet Take 1 tablet (81 mg total) by mouth daily. Swallow whole.   meloxicam  (MOBIC ) 7.5 MG tablet Take 1 tablet (7.5 mg total) by mouth daily. (Patient not taking: No sig reported)      Medical Decision Making      Recommendations    Teresa Wyline CROME, NP 01/18/24  12:21 PM

## 2024-01-18 NOTE — ED Notes (Signed)
 Patient is in the bedroom calm and sleeping atm, NAD. Respirations even and unlabored. Will continue to monitor for safety.
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
## 2024-01-19 DIAGNOSIS — F1090 Alcohol use, unspecified, uncomplicated: Secondary | ICD-10-CM | POA: Diagnosis not present

## 2024-01-19 DIAGNOSIS — F4321 Adjustment disorder with depressed mood: Secondary | ICD-10-CM | POA: Diagnosis not present

## 2024-01-19 DIAGNOSIS — F1910 Other psychoactive substance abuse, uncomplicated: Secondary | ICD-10-CM | POA: Diagnosis not present

## 2024-01-19 DIAGNOSIS — F1410 Cocaine abuse, uncomplicated: Secondary | ICD-10-CM | POA: Diagnosis not present

## 2024-01-19 NOTE — Care Management (Signed)
 [facility] Care Management  Writer met with the patient and discussed discharge planning.  Patient requests inpatient substance abuse treatment.    Writer left a voice mail message with the [facility] in [place] [phone] Ext [phone].

## 2024-01-19 NOTE — ED Notes (Signed)
 Paitent had lunch.

## 2024-01-19 NOTE — ED Notes (Signed)
 Pt became verbally aggressive at medication widow, initially refusing ans storming off to room. Pt declined dieretic multivitamin, thiamine and mental health medications. Prior to medication pass, pt says he feels 'alright' , but 'tired'. Pt attend [facility] group held earlier today.  Pt says he ate cereal for breakfast.

## 2024-01-19 NOTE — Care Management (Addendum)
 [facility] Care Management  [facility] states they need a letter/referral from [facility] in order for patient to receive services. [facility]   Writer will follow-up with [facility]
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
## 2024-01-19 NOTE — ED Provider Notes (Signed)
 Behavioral Health Progress Note  Date and Time: 01/19/2024 1:42 PM Name: Allen Mayer MRN:  [id number]  Subjective:  Allen Mayer is a 63 year old male patient with a reported psychiatric history significant for schizoaffective disorder, PTSD and depression and a medical history of CHF, COPD, hypertension, hyperlipidemia, neuropathy, diabetes type 2 and degenerative disc disease who was admitted to the [facility] on 01/18/24 with c/o of worsening depression and substance abuse, stating I need help emotionally. Patient states that he got out of jail in August 2025 and was incarcerated for 2 years for larceny. He reports worsening depression since August and describes his depressive symptoms as sadness, hopelessness, isolating, difficulty sleeping, poor appetite, decreased energy, and decreased motivation. He identifies current stressors as abusing drugs, homelessness, waiting on HUD housing voucher from the [employer] and waiting to get approved for his disability from social security. He states that he was on disability for years prior to being incarcerated and that he went to [employer] in August and still haven't received his disability benefits. He states that he would like to stop drinking alcohol and using cocaine. He reports drinking alcohol off-and-on since he was 63 years old. He reports drinking alcohol every day since August (2025) and reports drinking a combination of liquor, wine and beer. He reports on average drinking anywhere from a mixture of a pint of liquor, 6-12 beers and 3 to 4 pints of wine. He states that he will typically consume a 1/2 pint of liquor and 3 beers or a pint of wine and a little beer in 24 hours. He states that he last consumed alcohol yesterday, 2 bootleggers, 3 beers and a pint of liquor. He denies alcohol withdrawal symptoms. He denies a history of alcohol withdrawal, delirium tremens or seizures.  He reports a sobriety of 2 years  when he was incarcerated, 5 years when he was incarcerated, and 7 years when he was incarcerated and states that he has been in and out of jail for years. He states that he is established with the [employer] in [place]. He reports a general discharge from the [employer] in 1981.   01/19/24: On evaluation, patient is alert and oriented x 4. His thought process is linear. His thought content is negative for suicidal thoughts, homicidal thoughts and auditory or visual hallucinations. No paranoia. Objectively, no signs of acute psychosis. He denies depressive symptoms. No reports of sadness, hopelessness, decreased energy, decreased motivation, worthlessness or anhedonia. He reports fair sleep. He reports fair appetite. He denies symptoms of anxiety. No excessive worrying, palpitations, sense of doom or anxiousness. He denies alcohol withdrawal symptoms or craving. CIWA score 1 for mild hypertension. No evidence of severe withdrawal or bilateral hand tremors on exam. Will continue to monitor for alcohol withdrawal. He denies physical pain at this time. He reports feeling slightly dizzy this morning after taking medications. He states that he does not want his heart medications adjusted or rescheduled to later time. He continues to express interest in going to substance abuse rehab.  Diagnosis:  Final diagnoses:  Cocaine abuse (HCC)  Adjustment disorder with depressed mood  Alcohol use  Substance abuse (HCC)    Total Time spent with patient: 30 minutes  Past Psychiatric History: a reported history of schizoaffective disorder, PTSD and depression. No history of inpatient psychiatric hospitalizations. [facility].    Past Medical History: a medical history of CHF, COPD, hypertension, hyperlipidemia, neuropathy, diabetes type 2 and degenerative disc disease   Family History: No  reported history.    Social History: Single. Homelessness. Cocaine use. Alcohol use.  Current Medications:  Current  Facility-Administered Medications  Medication Dose Route Frequency Provider Last Rate Last Admin   acetaminophen  (TYLENOL ) tablet 650 mg  650 mg Oral Q6H PRN Linwood Gullikson L, NP       albuterol  (VENTOLIN  HFA) 108 (90 Base) MCG/ACT inhaler 1-2 puff  1-2 puff Inhalation Q6H PRN Okey Zelek L, NP   2 puff at 01/18/24 1956   alum & mag hydroxide-simeth (MAALOX/MYLANTA) 200-200-20 MG/5ML suspension 30 mL  30 mL Oral Q4H PRN Nayely Dingus L, NP       ARIPiprazole  (ABILIFY ) tablet 10 mg  10 mg Oral Daily Derriona Branscom L, NP   10 mg at 01/18/24 1600   aspirin  EC tablet 81 mg  81 mg Oral Daily Launi Asencio L, NP   81 mg at 01/19/24 1120   atorvastatin  (LIPITOR ) tablet 80 mg  80 mg Oral QPM Michaeline Eckersley L, NP   80 mg at 01/18/24 1705   citalopram  (CELEXA ) tablet 20 mg  20 mg Oral Daily Darielys Giglia L, NP   20 mg at 01/18/24 1600   digoxin  (LANOXIN ) tablet 0.125 mg  0.125 mg Oral Daily Olivia Pavelko L, NP   0.125 mg at 01/19/24 1120   empagliflozin  (JARDIANCE ) tablet 10 mg  10 mg Oral Daily Shiro Ellerman L, NP   10 mg at 01/19/24 1123   gabapentin  (NEURONTIN ) capsule 400 mg  400 mg Oral Daily PRN Char Feltman L, NP       hydrOXYzine (ATARAX) tablet 25 mg  25 mg Oral Q6H PRN Sagan Maselli L, NP       loperamide (IMODIUM) capsule 2-4 mg  2-4 mg Oral PRN Leon Montoya L, NP       LORazepam  (ATIVAN ) tablet 1 mg  1 mg Oral Q6H PRN Salwa Bai L, NP       losartan  (COZAAR ) tablet 25 mg  25 mg Oral Daily Timoty Bourke L, NP   25 mg at 01/19/24 1120   magnesium  hydroxide (MILK OF MAGNESIA) suspension 30 mL  30 mL Oral Daily PRN Joachim Carton L, NP       multivitamin with minerals tablet 1 tablet  1 tablet Oral Daily Heyward Douthit L, NP   1 tablet at 01/18/24 1600   OLANZapine (ZYPREXA) injection 5 mg  5 mg Intramuscular TID PRN Ryatt Corsino L, NP       OLANZapine zydis (ZYPREXA) disintegrating tablet 5 mg  5 mg Oral TID PRN Roizy Harold L, NP       ondansetron  (ZOFRAN -ODT) disintegrating  tablet 4 mg  4 mg Oral Q6H PRN Lian Pounds L, NP       rivaroxaban  (XARELTO ) tablet 20 mg  20 mg Oral Q supper Iris Hairston L, NP   20 mg at 01/18/24 1600   spironolactone  (ALDACTONE ) tablet 12.5 mg  12.5 mg Oral Daily Ailed Defibaugh L, NP   12.5 mg at 01/19/24 1119   thiamine (VITAMIN B1) tablet 100 mg  100 mg Oral Daily Oneta Sigman L, NP       torsemide  (DEMADEX ) tablet 20 mg  20 mg Oral Daily Kathreen Dileo L, NP   20 mg at 01/18/24 1600   Current Outpatient Medications  Medication Sig Dispense Refill   albuterol  (VENTOLIN  HFA) 108 (90 Base) MCG/ACT inhaler Inhale 1-2 puffs into the lungs every 6 (six) hours as needed for wheezing or shortness of breath. 18 g 0  ARIPiprazole  (ABILIFY ) 10 MG tablet Take 1 tablet (10 mg total) by mouth daily. 90 tablet 0   aspirin  EC 81 MG tablet Take 1 tablet (81 mg total) by mouth daily. Swallow whole. 90 tablet 1   atorvastatin  (LIPITOR ) 80 MG tablet Take 1 tablet (80 mg total) by mouth daily. 90 tablet 1   cholecalciferol  (VITAMIN D3) 25 MCG (1000 UNIT) tablet Take 5 tablets (5,000 Units total) by mouth daily. 150 tablet 0   citalopram  (CELEXA ) 20 MG tablet Take 1 tablet (20 mg total) by mouth daily. 90 tablet 0   digoxin  (LANOXIN ) 0.125 MG tablet Take 1 tablet (0.125 mg total) by mouth daily. 30 tablet 1   empagliflozin  (JARDIANCE ) 10 MG TABS tablet Take 1 tablet (10 mg total) by mouth daily. 90 tablet 1   gabapentin  (NEURONTIN ) 400 MG capsule Take 1 capsule (400 mg total) by mouth daily as needed (nerve pain). (Patient taking differently: Take 400 mg by mouth 2 (two) times daily as needed (nerve pain).) 90 capsule 0   losartan  (COZAAR ) 25 MG tablet Take 1 tablet (25 mg total) by mouth daily. 90 tablet 1   meloxicam  (MOBIC ) 7.5 MG tablet Take 1 tablet (7.5 mg total) by mouth daily. (Patient not taking: No sig reported) 5 tablet 0   mometasone -formoterol  (DULERA ) 200-5 MCG/ACT AERO Inhale 2 puffs into the lungs 2 (two) times daily.     rivaroxaban   (XARELTO ) 20 MG TABS tablet Take 1 tablet (20 mg total) by mouth daily with supper. 60 tablet 0   spironolactone  (ALDACTONE ) 25 MG tablet Take 1 tablet (25 mg total) by mouth daily. 90 tablet 1   torsemide  (DEMADEX ) 20 MG tablet Take 1 tablet (20 mg total) by mouth daily. 90 tablet 1   umeclidinium bromide  (INCRUSE ELLIPTA ) 62.5 MCG/ACT AEPB Inhale 1 puff into the lungs daily.      Labs  Lab Results:  Admission on 01/18/2024, Discharged on 01/18/2024  Component Date Value Ref Range Status   WBC 01/18/2024 4.7  4.0 - 10.5 K/uL Final   RBC 01/18/2024 5.64  4.22 - 5.81 MIL/uL Final   Hemoglobin 01/18/2024 15.8  13.0 - 17.0 g/dL Final   HCT 89/90/7974 50.3  39.0 - 52.0 % Final   MCV 01/18/2024 89.2  80.0 - 100.0 fL Final   MCH 01/18/2024 28.0  26.0 - 34.0 pg Final   MCHC 01/18/2024 31.4  30.0 - 36.0 g/dL Final   RDW 89/90/7974 14.8  11.5 - 15.5 % Final   Platelets 01/18/2024 223  150 - 400 K/uL Final   nRBC 01/18/2024 0.0  0.0 - 0.2 % Final   Neutrophils Relative % 01/18/2024 57  % Final   Neutro Abs 01/18/2024 2.7  1.7 - 7.7 K/uL Final   Lymphocytes Relative 01/18/2024 32  % Final   Lymphs Abs 01/18/2024 1.5  0.7 - 4.0 K/uL Final   Monocytes Relative 01/18/2024 6  % Final   Monocytes Absolute 01/18/2024 0.3  0.1 - 1.0 K/uL Final   Eosinophils Relative 01/18/2024 4  % Final   Eosinophils Absolute 01/18/2024 0.2  0.0 - 0.5 K/uL Final   Basophils Relative 01/18/2024 1  % Final   Basophils Absolute 01/18/2024 0.0  0.0 - 0.1 K/uL Final   Immature Granulocytes 01/18/2024 0  % Final   Abs Immature Granulocytes 01/18/2024 0.01  0.00 - 0.07 K/uL Final   Performed at [facility], [address] [place], [place] [address]   Sodium 01/18/2024 139  135 -  145 mmol/L Final   Potassium 01/18/2024 3.6  3.5 - 5.1 mmol/L Final   Chloride 01/18/2024 99  98 - 111 mmol/L Final   CO2 01/18/2024 29  22 - 32 mmol/L Final   Glucose, Bld 01/18/2024 120 (H)  70 - 99 mg/dL Final   Glucose reference  range applies only to samples taken after fasting for at least 8 hours.   BUN 01/18/2024 15  8 - 23 mg/dL Final   Creatinine, Ser 01/18/2024 0.95  0.61 - 1.24 mg/dL Final   Calcium  01/18/2024 9.5  8.9 - 10.3 mg/dL Final   Total Protein 89/90/7974 8.3 (H)  6.5 - 8.1 g/dL Final   Albumin 89/90/7974 4.0  3.5 - 5.0 g/dL Final   AST 89/90/7974 62 (H)  15 - 41 U/L Final   ALT 01/18/2024 46 (H)  0 - 44 U/L Final   Alkaline Phosphatase 01/18/2024 65  38 - 126 U/L Final   Total Bilirubin 01/18/2024 1.2  0.0 - 1.2 mg/dL Final   GFR, Estimated 01/18/2024 >60  >60 mL/min Final   Comment: (NOTE) Calculated using the CKD-EPI Creatinine Equation (2021)    Anion gap 01/18/2024 11  5 - 15 Final   Performed at [facility], [address] [place], [place] [address]   Hgb A1c MFr Bld 01/18/2024 5.7 (H)  4.8 - 5.6 % Final   Comment: (NOTE) Diagnosis of Diabetes The following HbA1c ranges recommended by the American Diabetes Association (ADA) may be used as an aid in the diagnosis of diabetes mellitus.  Hemoglobin             Suggested A1C NGSP%              Diagnosis  <5.7                   Non Diabetic  5.7-6.4                Pre-Diabetic  >6.4                   Diabetic  <7.0                   Glycemic control for                       adults with diabetes.     Mean Plasma Glucose 01/18/2024 116.89  mg/dL Final   Performed at [facility], [address], [place], [place] [address]   Alcohol, Ethyl (B) 01/18/2024 <15  <15 mg/dL Final   Comment: (NOTE) For medical purposes only. Performed at [facility], [address] [place], [place] [address]    Cholesterol 01/18/2024 92  0 - 200 mg/dL Final   Triglycerides 89/90/7974 34  <150 mg/dL Final   HDL 89/90/7974 46  >40 mg/dL Final   Total CHOL/HDL Ratio 01/18/2024 2.0  RATIO Final   VLDL 01/18/2024 7  0 - 40 mg/dL Final   LDL Cholesterol 01/18/2024 39  0 - 99 mg/dL Final   Comment:        Total Cholesterol/HDL:CHD  Risk Coronary Heart Disease Risk Table                     Men   Women  1/2 Average Risk   3.4   3.3  Average Risk       5.0   4.4  2 X Average Risk  9.6   7.1  3 X Average Risk  23.4   11.0        Use the calculated Patient Ratio above and the CHD Risk Table to determine the patient's CHD Risk.        ATP III CLASSIFICATION (LDL):  <100     mg/dL   Optimal  899-870  mg/dL   Near or Above                    Optimal  130-159  mg/dL   Borderline  839-810  mg/dL   High  >809     mg/dL   Very High Performed at [facility], [address] [place], [place] [address]    TSH 01/18/2024 0.814  0.350 - 4.500 uIU/mL Final   Comment: Performed by a 3rd Generation assay with a functional sensitivity of <=0.01 uIU/mL. Performed at [facility], [address] [place], [place] [address]    POC Amphetamine UR 01/18/2024 None Detected  NONE DETECTED (Cut Off Level 1000 ng/mL) Final   POC Secobarbital (BAR) 01/18/2024 None Detected  NONE DETECTED (Cut Off Level 300 ng/mL) Final   POC Buprenorphine (BUP) 01/18/2024 None Detected  NONE DETECTED (Cut Off Level 10 ng/mL) Final   POC Oxazepam (BZO) 01/18/2024 None Detected  NONE DETECTED (Cut Off Level 300 ng/mL) Final   POC Cocaine UR 01/18/2024 Positive (A)  NONE DETECTED (Cut Off Level 300 ng/mL) Final   POC Methamphetamine UR 01/18/2024 None Detected  NONE DETECTED (Cut Off Level 1000 ng/mL) Final   POC Morphine  01/18/2024 None Detected  NONE DETECTED (Cut Off Level 300 ng/mL) Final   POC Methadone UR 01/18/2024 None Detected  NONE DETECTED (Cut Off Level 300 ng/mL) Final   POC Oxycodone  UR 01/18/2024 None Detected  NONE DETECTED (Cut Off Level 100 ng/mL) Final   POC Marijuana UR 01/18/2024 None Detected  NONE DETECTED (Cut Off Level 50 ng/mL) Final  Admission on 01/11/2024, Discharged on 01/12/2024  Component Date Value Ref Range Status   Sodium 01/11/2024 141  135 - 145 mmol/L Final   Potassium 01/11/2024 4.2  3.5 - 5.1 mmol/L  Final   Chloride 01/11/2024 104  98 - 111 mmol/L Final   CO2 01/11/2024 23  22 - 32 mmol/L Final   Glucose, Bld 01/11/2024 125 (H)  70 - 99 mg/dL Final   Glucose reference range applies only to samples taken after fasting for at least 8 hours.   BUN 01/11/2024 20  8 - 23 mg/dL Final   Creatinine, Ser 01/11/2024 1.26 (H)  0.61 - 1.24 mg/dL Final   Calcium  01/11/2024 9.1  8.9 - 10.3 mg/dL Final   GFR, Estimated 01/11/2024 >60  >60 mL/min Final   Comment: (NOTE) Calculated using the CKD-EPI Creatinine Equation (2021)    Anion gap 01/11/2024 14  5 - 15 Final   Performed at [facility], [address] [place], [place] [address]   WBC 01/11/2024 5.8  4.0 - 10.5 K/uL Final   RBC 01/11/2024 5.28  4.22 - 5.81 MIL/uL Final   Hemoglobin 01/11/2024 15.0  13.0 - 17.0 g/dL Final   HCT 89/97/7974 47.2  39.0 - 52.0 % Final   MCV 01/11/2024 89.4  80.0 - 100.0 fL Final   MCH 01/11/2024 28.4  26.0 - 34.0 pg Final   MCHC 01/11/2024 31.8  30.0 - 36.0 g/dL Final   RDW 89/97/7974 14.8  11.5 - 15.5 % Final   Platelets  01/11/2024 237  150 - 400 K/uL Final   nRBC 01/11/2024 0.0  0.0 - 0.2 % Final   Performed at [facility], [address], [place], [place] [address]   Troponin I (High Sensitivity) 01/11/2024 23 (H)  <18 ng/L Final   Comment: (NOTE) Elevated high sensitivity troponin I (hsTnI) values and significant  changes across serial measurements may suggest ACS but many other  chronic and acute conditions are known to elevate hsTnI results.  Refer to the Links section for chest pain algorithms and additional  guidance. Performed at [facility], [address] [place], [place] [address]    Troponin I (High Sensitivity) 01/11/2024 19 (H)  <18 ng/L Final   Comment: (NOTE) Elevated high sensitivity troponin I (hsTnI) values and significant  changes across serial measurements may suggest ACS but many other  chronic and acute conditions are known to elevate hsTnI results.  Refer  to the Links section for chest pain algorithms and additional  guidance. Performed at [facility], [address] [place], [place] [address]   Hospital Outpatient Visit on 01/08/2024  Component Date Value Ref Range Status   Sodium 01/08/2024 137  135 - 145 mmol/L Final   Potassium 01/08/2024 4.6  3.5 - 5.1 mmol/L Final   Chloride 01/08/2024 101  98 - 111 mmol/L Final   CO2 01/08/2024 27  22 - 32 mmol/L Final   Glucose, Bld 01/08/2024 90  70 - 99 mg/dL Final   Glucose reference range applies only to samples taken after fasting for at least 8 hours.   BUN 01/08/2024 13  8 - 23 mg/dL Final   Creatinine, Ser 01/08/2024 0.94  0.61 - 1.24 mg/dL Final   Calcium  01/08/2024 9.9  8.9 - 10.3 mg/dL Final   GFR, Estimated 01/08/2024 >60  >60 mL/min Final   Comment: (NOTE) Calculated using the CKD-EPI Creatinine Equation (2021)    Anion gap 01/08/2024 9  5 - 15 Final   Performed at [facility], [address], [place], [place] [address]   B Natriuretic Peptide 01/08/2024 27.6  0.0 - 100.0 pg/mL Final   Performed at [facility], [address], [place], [place] [address]  Admission on 12/29/2023, Discharged on 12/30/2023  Component Date Value Ref Range Status   SARS Coronavirus 2 by RT PCR 12/30/2023 NEGATIVE  NEGATIVE Final   Influenza A by PCR 12/30/2023 NEGATIVE  NEGATIVE Final   Influenza B by PCR 12/30/2023 NEGATIVE  NEGATIVE Final   Comment: (NOTE) The Xpert Xpress SARS-CoV-2/FLU/RSV plus assay is intended as an aid in the diagnosis of influenza from Nasopharyngeal swab specimens and should not be used as a sole basis for treatment. Nasal washings and aspirates are unacceptable for Xpert Xpress SARS-CoV-2/FLU/RSV testing.  Fact Sheet for Patients: BloggerCourse.com  Fact Sheet for Healthcare Providers: SeriousBroker.it  This test is not yet approved or cleared by the [place]  FDA and has been authorized  for detection and/or diagnosis of SARS-CoV-2 by FDA under an Emergency Use Authorization (EUA). This EUA will remain in effect (meaning this test can be used) for the duration of the COVID-19 declaration under Section 564(b)(1) of the Act, 21 U.S.C. section 360bbb-3(b)(1), unless the authorization is terminated or revoked.     Resp Syncytial Virus by PCR 12/30/2023 NEGATIVE  NEGATIVE Final   Comment: (NOTE) Fact Sheet for Patients: BloggerCourse.com  Fact Sheet for Healthcare Providers: SeriousBroker.it  This test is not yet approved or cleared by  the [place]  FDA and has been authorized for detection and/or diagnosis of SARS-CoV-2 by FDA under an Emergency Use Authorization (EUA). This EUA will remain in effect (meaning this test can be used) for the duration of the COVID-19 declaration under Section 564(b)(1) of the Act, 21 U.S.C. section 360bbb-3(b)(1), unless the authorization is terminated or revoked.  Performed at [facility], [address] [place], [place] [address]    Sodium 12/30/2023 142  135 - 145 mmol/L Final   Potassium 12/30/2023 4.4  3.5 - 5.1 mmol/L Final   Chloride 12/30/2023 105  98 - 111 mmol/L Final   BUN 12/30/2023 23  8 - 23 mg/dL Final   Creatinine, Ser 12/30/2023 1.20  0.61 - 1.24 mg/dL Final   Glucose, Bld 90/79/7974 126 (H)  70 - 99 mg/dL Final   Glucose reference range applies only to samples taken after fasting for at least 8 hours.   Calcium , Ion 12/30/2023 1.20  1.15 - 1.40 mmol/L Final   TCO2 12/30/2023 28  22 - 32 mmol/L Final   Hemoglobin 12/30/2023 16.0  13.0 - 17.0 g/dL Final   HCT 90/79/7974 47.0  39.0 - 52.0 % Final  Admission on 12/23/2023, Discharged on 12/25/2023  Component Date Value Ref Range Status   Sodium 12/23/2023 139  135 - 145 mmol/L Final   Potassium 12/23/2023 4.0  3.5 - 5.1 mmol/L Final   Chloride 12/23/2023 103  98 - 111 mmol/L Final   CO2 12/23/2023 25  22 -  32 mmol/L Final   Glucose, Bld 12/23/2023 100 (H)  70 - 99 mg/dL Final   Glucose reference range applies only to samples taken after fasting for at least 8 hours.   BUN 12/23/2023 17  8 - 23 mg/dL Final   Creatinine, Ser 12/23/2023 0.98  0.61 - 1.24 mg/dL Final   Calcium  12/23/2023 9.3  8.9 - 10.3 mg/dL Final   GFR, Estimated 12/23/2023 >60  >60 mL/min Final   Comment: (NOTE) Calculated using the CKD-EPI Creatinine Equation (2021)    Anion gap 12/23/2023 11  5 - 15 Final   Performed at [facility], [address], [place], [place] [address]   WBC 12/23/2023 6.2  4.0 - 10.5 K/uL Final   RBC 12/23/2023 5.30  4.22 - 5.81 MIL/uL Final   Hemoglobin 12/23/2023 15.1  13.0 - 17.0 g/dL Final   HCT 90/86/7974 48.0  39.0 - 52.0 % Final   MCV 12/23/2023 90.6  80.0 - 100.0 fL Final   MCH 12/23/2023 28.5  26.0 - 34.0 pg Final   MCHC 12/23/2023 31.5  30.0 - 36.0 g/dL Final   RDW 90/86/7974 14.6  11.5 - 15.5 % Final   Platelets 12/23/2023 239  150 - 400 K/uL Final   nRBC 12/23/2023 0.0  0.0 - 0.2 % Final   Performed at [facility], [address] [place], [place] [address]   Troponin I (High Sensitivity) 12/23/2023 19 (H)  <18 ng/L Final   Comment: (NOTE) Elevated high sensitivity troponin I (hsTnI) values and significant  changes across serial measurements may suggest ACS but many other  chronic and acute conditions are known to elevate hsTnI results.  Refer to the Links section for chest pain algorithms and additional  guidance. Performed at [facility], [address] [place], [place] [address]    Sodium 12/23/2023 141  135 - 145 mmol/L Final   Potassium 12/23/2023 3.9  3.5 - 5.1 mmol/L Final  Chloride 12/23/2023 105  98 - 111 mmol/L Final   BUN 12/23/2023 20  8 - 23 mg/dL Final   Creatinine, Ser 12/23/2023 1.00  0.61 - 1.24 mg/dL Final   Glucose, Bld 90/86/7974 100 (H)  70 - 99 mg/dL Final   Glucose reference range applies only to samples taken after fasting for  at least 8 hours.   Calcium , Ion 12/23/2023 1.19  1.15 - 1.40 mmol/L Final   TCO2 12/23/2023 24  22 - 32 mmol/L Final   Hemoglobin 12/23/2023 16.7  13.0 - 17.0 g/dL Final   HCT 90/86/7974 49.0  39.0 - 52.0 % Final   Troponin I (High Sensitivity) 12/24/2023 16  <18 ng/L Final   Comment: (NOTE) Elevated high sensitivity troponin I (hsTnI) values and significant  changes across serial measurements may suggest ACS but many other  chronic and acute conditions are known to elevate hsTnI results.  Refer to the Links section for chest pain algorithms and additional  guidance. Performed at [facility], [address], [place], [place] [address]    SARS Coronavirus 2 by RT PCR 12/24/2023 NEGATIVE  NEGATIVE Final   Influenza A by PCR 12/24/2023 NEGATIVE  NEGATIVE Final   Influenza B by PCR 12/24/2023 NEGATIVE  NEGATIVE Final   Comment: (NOTE) The Xpert Xpress SARS-CoV-2/FLU/RSV plus assay is intended as an aid in the diagnosis of influenza from Nasopharyngeal swab specimens and should not be used as a sole basis for treatment. Nasal washings and aspirates are unacceptable for Xpert Xpress SARS-CoV-2/FLU/RSV testing.  Fact Sheet for Patients: BloggerCourse.com  Fact Sheet for Healthcare Providers: SeriousBroker.it  This test is not yet approved or cleared by the [place]  FDA and has been authorized for detection and/or diagnosis of SARS-CoV-2 by FDA under an Emergency Use Authorization (EUA). This EUA will remain in effect (meaning this test can be used) for the duration of the COVID-19 declaration under Section 564(b)(1) of the Act, 21 U.S.C. section 360bbb-3(b)(1), unless the authorization is terminated or revoked.     Resp Syncytial Virus by PCR 12/24/2023 NEGATIVE  NEGATIVE Final   Comment: (NOTE) Fact Sheet for Patients: BloggerCourse.com  Fact Sheet for Healthcare  Providers: SeriousBroker.it  This test is not yet approved or cleared by the [place]  FDA and has been authorized for detection and/or diagnosis of SARS-CoV-2 by FDA under an Emergency Use Authorization (EUA). This EUA will remain in effect (meaning this test can be used) for the duration of the COVID-19 declaration under Section 564(b)(1) of the Act, 21 U.S.C. section 360bbb-3(b)(1), unless the authorization is terminated or revoked.  Performed at [facility], [address] [place], [place] [address]    B Natriuretic Peptide 12/24/2023 11.7  0.0 - 100.0 pg/mL Final   Performed at [facility] Lab, [address] [place], [place] [address]   Opiates 12/24/2023 NONE DETECTED  NONE DETECTED Final   Cocaine 12/24/2023 POSITIVE (A)  NONE DETECTED Final   Benzodiazepines 12/24/2023 NONE DETECTED  NONE DETECTED Final   Amphetamines 12/24/2023 NONE DETECTED  NONE DETECTED Final   Tetrahydrocannabinol 12/24/2023 NONE DETECTED  NONE DETECTED Final   Barbiturates 12/24/2023 NONE DETECTED  NONE DETECTED Final   Comment: (NOTE) DRUG SCREEN FOR MEDICAL PURPOSES ONLY.  IF CONFIRMATION IS NEEDED FOR ANY PURPOSE, NOTIFY LAB WITHIN 5 DAYS.  LOWEST DETECTABLE LIMITS FOR URINE DRUG SCREEN Drug Class  Cutoff (ng/mL) Amphetamine and metabolites    1000 Barbiturate and metabolites    200 Benzodiazepine                 200 Opiates and metabolites        300 Cocaine and metabolites        300 THC                            50 Performed at [facility] Lab, [address], [place], [place] [address]    HIV Screen 4th Generation wRfx 12/24/2023 Non Reactive  Non Reactive Final   Performed at [facility] Lab, [address] [place], [place] [address]   Weight 12/24/2023 2,504.43  oz Final   Height 12/24/2023 72  in Final   BP 12/24/2023 151/88  mmHg Final   S' Lateral 12/24/2023 4.10  cm Final   Area-P 1/2 12/24/2023 2.91  cm2  Final   Est EF 12/24/2023 35 - 40%   Final   Digoxin  Level 12/24/2023 <0.6 (L)  0.8 - 2.0 ng/mL Final   Comment: RESULT CONFIRMED BY MANUAL DILUTION Performed at [facility] Lab, [address], [place], [place] [address]    WBC 12/25/2023 5.9  4.0 - 10.5 K/uL Final   RBC 12/25/2023 5.73  4.22 - 5.81 MIL/uL Final   Hemoglobin 12/25/2023 16.2  13.0 - 17.0 g/dL Final   HCT 90/84/7974 50.8  39.0 - 52.0 % Final   MCV 12/25/2023 88.7  80.0 - 100.0 fL Final   MCH 12/25/2023 28.3  26.0 - 34.0 pg Final   MCHC 12/25/2023 31.9  30.0 - 36.0 g/dL Final   RDW 90/84/7974 14.6  11.5 - 15.5 % Final   Platelets 12/25/2023 243  150 - 400 K/uL Final   nRBC 12/25/2023 0.0  0.0 - 0.2 % Final   Performed at [facility] Lab, [address], [place], [place] [address]   Sodium 12/25/2023 139  135 - 145 mmol/L Final   Potassium 12/25/2023 3.5  3.5 - 5.1 mmol/L Final   Chloride 12/25/2023 101  98 - 111 mmol/L Final   CO2 12/25/2023 26  22 - 32 mmol/L Final   Glucose, Bld 12/25/2023 109 (H)  70 - 99 mg/dL Final   Glucose reference range applies only to samples taken after fasting for at least 8 hours.   BUN 12/25/2023 22  8 - 23 mg/dL Final   Creatinine, Ser 12/25/2023 1.32 (H)  0.61 - 1.24 mg/dL Final   Calcium  12/25/2023 8.9  8.9 - 10.3 mg/dL Final   GFR, Estimated 12/25/2023 >60  >60 mL/min Final   Comment: (NOTE) Calculated using the CKD-EPI Creatinine Equation (2021)    Anion gap 12/25/2023 12  5 - 15 Final   Performed at [facility] Lab, [address] [place], [place] [address]    Blood Alcohol level:  Lab Results  Component Value Date   [facility] <15 01/18/2024   ETH <10 03/15/2022    Metabolic Disorder Labs: Lab Results  Component Value Date   HGBA1C 5.7 (H) 01/18/2024   MPG 116.89 01/18/2024   MPG 131.24 02/26/2022   No results found for: PROLACTIN Lab Results  Component Value Date   CHOL 92 01/18/2024   TRIG 34 01/18/2024   HDL 46 01/18/2024   CHOLHDL 2.0 01/18/2024    VLDL 7 01/18/2024   LDLCALC 39 01/18/2024   LDLCALC 69 07/31/2021  Therapeutic Lab Levels: No results found for: LITHIUM No results found for: VALPROATE No results found for: CBMZ  Physical Findings   AUDIT    Flowsheet Row ED to Hosp-Admission (Discharged) from 01/13/2022 in [facility] 3E HF PCU  Alcohol Use Disorder Identification Test Final Score (AUDIT) 3   PHQ2-9    Flowsheet Row ED from 01/18/2024 in [facility]  PHQ-2 Total Score 6  PHQ-9 Total Score 18   Flowsheet Row ED from 01/18/2024 in [facility] Most recent reading at 01/18/2024  2:54 PM ED from 01/18/2024 in [facility] Most recent reading at 01/18/2024 12:01 PM ED from 01/11/2024 in [facility] Emergency Department at [facility] Most recent reading at 01/11/2024  9:40 PM  C-SSRS RISK CATEGORY No Risk No Risk No Risk     Musculoskeletal  Strength & Muscle Tone: within normal limits Gait & Station: normal Patient leans: N/A  Psychiatric Specialty Exam  Presentation  General Appearance:  Appropriate for Environment  Eye Contact: Fair  Speech: Clear and Coherent  Speech Volume: Normal  Handedness: Right   Mood and Affect  Mood: Dysphoric  Affect: Flat   Thought Process  Thought Processes: Coherent; Goal Directed  Descriptions of Associations:Intact  Orientation:Full (Time, Place and Person)  Thought Content:Logical  Diagnosis of Schizophrenia or Schizoaffective disorder in past: Yes    Hallucinations:Hallucinations: None  Ideas of Reference:None  Suicidal Thoughts:Suicidal Thoughts: No  Homicidal Thoughts:Homicidal Thoughts: No   Sensorium  Memory: Immediate Fair; Recent Fair; Remote Fair  Judgment: Fair  Insight: Fair   Chartered certified accountant: Fair  Attention Span: Fair  Recall: [employer] of  Knowledge: Fair  Language: Fair   Psychomotor Activity  Psychomotor Activity: Psychomotor Activity: Normal   Assets  Assets: Communication Skills; Desire for Improvement; Financial Resources/Insurance   Sleep  Sleep: Sleep: Fair    Nutritional Assessment (For OBS and FBC admissions only) Has the patient had a weight loss or gain of 10 pounds or more in the last 3 months?: No Has the patient had a decrease in food intake/or appetite?: Yes Does the patient have dental problems?: Yes Does the patient have eating habits or behaviors that may be indicators of an eating disorder including binging or inducing vomiting?: No Has the patient recently lost weight without trying?: 0 Has the patient been eating poorly because of a decreased appetite?: 1 Malnutrition Screening Tool Score: 1    Physical Exam  Physical Exam Cardiovascular:     Rate and Rhythm: Normal rate.  Musculoskeletal:        General: Normal range of motion.     Cervical back: Normal range of motion.  Neurological:     Mental Status: He is alert and oriented to person, place, and time.    Review of Systems  Constitutional: Negative.   HENT: Negative.    Eyes: Negative.   Respiratory: Negative.    Cardiovascular: Negative.   Gastrointestinal: Negative.   Genitourinary: Negative.   Musculoskeletal: Negative.   Neurological: Negative.    Blood pressure (!) 146/86, pulse 62, temperature 97.7 F (36.5 C), temperature source Oral, resp. rate 18, SpO2 100%. There is no height or weight on file to calculate BMI.  Treatment Plan Summary: Allen Mayer is a 63 year old male patient with a reported psychiatric history significant for schizoaffective disorder, PTSD and depression and a medical history of CHF, COPD, hypertension, hyperlipidemia, neuropathy, diabetes type 2 and degenerative disc disease  who presented to the [facility] voluntary unaccompanied with complaints of  worsening depression and substance abuse, stating I need help emotionally. Patient to be admitted to the facility-based crisis center for mood stabilization and substance abuse treatment.    Labs UDS positive for cocaine. BAL negative    Home medications  Continue Abilify  10 mg by mouth daily for mood stabilization and hx of schizoaffective disorder Continue Celexa  20 mg daily for depression Continue albuterol  1 to 2 puffs every 6 hours as needed for shortness of breath/asthma Continue aspirin  81 mg p.o. daily for extensive cardiac history Continue digoxin  0.125 mg p.o. daily for history of CHF Continue losartan  25 mg p.o. daily for history of hypertension Continue Xarelto  20 mg p.o. daily for cardiac history/prevent clots  Continue spironolactone  12.5 mg daily for cardiac history/CHF Continue torsemide  20 mg daily for cardiac history/CHF Continue atorvastatin  80 mg daily for hyperlipidemia Continue Jardiance  10 mg p.o. daily for history of diabetes type 2 Continue add on ativan  1 mg every 6 hours prn for CIWA greater than 10 for alcohol withdrawal    Screening Tools CIWA to monitor of alcohol withdrawal   CIWA score 0  Disposition, pending, patient is interested in residential substance abuse rehabilitation.   Safira Proffit L, NP 01/19/2024 1:42 PM
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
## 2024-01-19 NOTE — ED Notes (Signed)
 Mht went into paitent's room because door was shut and paitent was holding door.

## 2024-01-19 NOTE — ED Notes (Signed)
 Pt ate dinner, compliant with evening medications. Pt generally calm and cooperative.

## 2024-01-19 NOTE — Care Management (Addendum)
 Writer contacted [place]  [facility] and was informed by contact specialist, that veteran does not have assigned PCP or social worker at this due to his recent re[facility]engagement with [facility] after a [facility] year [facility].   Writer contacted the following location to locate placement for a BH patient:  [place]: Pending a return call from [place] the intake specialist. Left Voicemail. [place]: AOD reported no available beds at this time.   Contacted the 72-hour reporting line for inpatient stay at [phone] authorization will be sent via fax.   Ora Mohr, [facility] 01/19/24 @ 12:02 PM  Contacted the AOD ([place]) at [facility] who reported presently 3 beds are available, however they have over 3 veteran in their ED being transferred into the open slots. AOD stated if a bed becomes available we can call back on Tuesday to check, due to Monday being a federal holiday.  Ora Mohr,  [facility] 01/19/2024 @ 4:30PM

## 2024-01-19 NOTE — ED Notes (Signed)
 Pt became volatile, refusing schedule AM medications.

## 2024-01-19 NOTE — ED Notes (Signed)
 Patient asleep, no CIWA assessment completed at this time.

## 2024-01-19 NOTE — ED Notes (Signed)
 Paitent attended group

## 2024-01-19 NOTE — ED Notes (Signed)
 Paitent had breakfast.

## 2024-01-19 NOTE — ED Notes (Signed)
 Patient is in the bedroom asleep. NAD

## 2024-01-20 ENCOUNTER — Encounter (HOSPITAL_COMMUNITY): Payer: Self-pay | Admitting: Behavioral Health

## 2024-01-20 DIAGNOSIS — F1410 Cocaine abuse, uncomplicated: Secondary | ICD-10-CM | POA: Diagnosis present

## 2024-01-20 DIAGNOSIS — F259 Schizoaffective disorder, unspecified: Secondary | ICD-10-CM | POA: Diagnosis not present

## 2024-01-20 DIAGNOSIS — Z7982 Long term (current) use of aspirin: Secondary | ICD-10-CM | POA: Diagnosis not present

## 2024-01-20 DIAGNOSIS — Z79899 Other long term (current) drug therapy: Secondary | ICD-10-CM | POA: Diagnosis not present

## 2024-01-20 DIAGNOSIS — F4321 Adjustment disorder with depressed mood: Secondary | ICD-10-CM

## 2024-01-20 DIAGNOSIS — I509 Heart failure, unspecified: Secondary | ICD-10-CM | POA: Diagnosis not present

## 2024-01-20 DIAGNOSIS — F4310 Post-traumatic stress disorder, unspecified: Secondary | ICD-10-CM | POA: Diagnosis not present

## 2024-01-20 DIAGNOSIS — E785 Hyperlipidemia, unspecified: Secondary | ICD-10-CM | POA: Diagnosis not present

## 2024-01-20 DIAGNOSIS — I110 Hypertensive heart disease with heart failure: Secondary | ICD-10-CM | POA: Diagnosis not present

## 2024-01-20 DIAGNOSIS — E1140 Type 2 diabetes mellitus with diabetic neuropathy, unspecified: Secondary | ICD-10-CM | POA: Diagnosis not present

## 2024-01-20 DIAGNOSIS — Z5900 Homelessness unspecified: Secondary | ICD-10-CM | POA: Diagnosis not present

## 2024-01-20 DIAGNOSIS — F1090 Alcohol use, unspecified, uncomplicated: Secondary | ICD-10-CM | POA: Diagnosis not present

## 2024-01-20 DIAGNOSIS — J4489 Other specified chronic obstructive pulmonary disease: Secondary | ICD-10-CM | POA: Diagnosis not present

## 2024-01-20 DIAGNOSIS — Z7984 Long term (current) use of oral hypoglycemic drugs: Secondary | ICD-10-CM | POA: Diagnosis not present

## 2024-01-20 DIAGNOSIS — F1910 Other psychoactive substance abuse, uncomplicated: Secondary | ICD-10-CM | POA: Diagnosis not present

## 2024-01-20 LAB — VITAMIN B12: Vitamin B-12: 366 pg/mL (ref 180–914)

## 2024-01-20 LAB — VITAMIN D 25 HYDROXY (VIT D DEFICIENCY, FRACTURES): Vit D, 25-Hydroxy: 38.23 ng/mL (ref 30–100)

## 2024-01-20 MED ORDER — SERTRALINE HCL 25 MG PO TABS
25.0000 mg | ORAL_TABLET | Freq: Every day | ORAL | Status: DC
  Filled 2024-01-20: qty 1

## 2024-01-20 MED ORDER — FLUTICASONE FUROATE-VILANTEROL 100-25 MCG/ACT IN AEPB
1.0000 | INHALATION_SPRAY | Freq: Every day | RESPIRATORY_TRACT | Status: DC
  Administered 2024-01-20 – 2024-01-23 (×2): 1 via RESPIRATORY_TRACT
  Filled 2024-01-20: qty 28

## 2024-01-20 MED ORDER — GABAPENTIN 100 MG PO CAPS
200.0000 mg | ORAL_CAPSULE | Freq: Two times a day (BID) | ORAL | Status: DC
  Administered 2024-01-20 – 2024-01-23 (×6): 200 mg via ORAL
  Filled 2024-01-20: qty 28
  Filled 2024-01-20 (×7): qty 2
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
## 2024-01-20 NOTE — ED Notes (Signed)
 Patient A&Ox4. Denies intent to harm self/others when asked. Denies A/VH. Patient denies any physical complaints when asked. No acute distress noted. Support and encouragement provided. Routine safety checks conducted according to facility protocol. Encouraged patient to notify staff if thoughts of harm toward self or others arise. Patient verbalize understanding and agreement. Will continue to monitor for safety.

## 2024-01-20 NOTE — ED Notes (Signed)
 Pt is sleeping in his room. No acute distress noted.Q15 safety checks in place.

## 2024-01-20 NOTE — ED Notes (Signed)
 Pt sitting in dayroom watching television and interacting with peers. No acute distress noted. No concerns voiced. Informed pt to notify staff with any needs or assistance. Pt verbalized understanding and agreement. Will continue to monitor for safety.

## 2024-01-20 NOTE — ED Provider Notes (Signed)
 Behavioral Health Progress Note  Date and Time: 01/20/2024 2:32 PM Name: Allen Mayer MRN:  [id number]  HPI:  Allen Mayer is a 63 year old male patient with a reported psychiatric history significant for schizoaffective disorder, PTSD and depression and a medical history of CHF, COPD, hypertension, hyperlipidemia, neuropathy, diabetes type 2 and degenerative disc disease who was admitted to the [facility] on 01/18/24 with c/o of worsening depression and substance abuse, stating I need help emotionally. Patient states that he got out of jail in August 2025 and was incarcerated for 2 years for larceny. He reports worsening depression since August and describes his depressive symptoms as sadness, hopelessness, isolating, difficulty sleeping, poor appetite, decreased energy, and decreased motivation. He identifies current stressors as abusing drugs, homelessness, waiting on HUD housing voucher from the [employer] and waiting to get approved for his disability from social security. He states that he was on disability for years prior to being incarcerated and that he went to [employer] in August and still haven't received his disability benefits. He states that he would like to stop drinking alcohol and using cocaine. He reports drinking alcohol off-and-on since he was 63 years old. He reports drinking alcohol every day since August (2025) and reports drinking a combination of liquor, wine and beer. He reports on average drinking anywhere from a mixture of a pint of liquor, 6-12 beers and 3 to 4 pints of wine. He states that he will typically consume a 1/2 pint of liquor and 3 beers or a pint of wine and a little beer in 24 hours. He states that he last consumed alcohol yesterday, 2 bootleggers, 3 beers and a pint of liquor. He denies alcohol withdrawal symptoms. He denies a history of alcohol withdrawal, delirium tremens or seizures.  He reports a sobriety of 2 years when he  was incarcerated, 5 years when he was incarcerated, and 7 years when he was incarcerated and states that he has been in and out of jail for years. He states that he is established with the [employer] in [place]. He reports a general discharge from the [employer] in 1981.   01/20/24: On evaluation, pt presents with a flat affect and depressed mood, attention to personal hygiene and grooming is poor, he appears disheveled and the need to tend to personal hygiene and grooming has been reiterated. Eye contact is fair, speech is clear & coherent. Thought contents are organized and logical, and pt currently denies SI/HI/AVH or paranoia. There is no evidence of delusional thoughts. Denies first ranks and there are no overt signs of psychosis.   Patient shares that he does not drink alcohol every day, but admits to drinking 4 days out of the week, states that he drinks what ever I can get, liquor or beer, unable to quantify how much he drinks.  Reports multiple blackouts, denies having a history of seizures related to alcohol use.  Reports cravings via wanting alcohol first thing in the morning.  Reports that alcoholism started at age 14 to 63 years old, and it became a habit in his 31s.  Patient is asked if he is homeless, and states I am in transit, clarifies by stating that he was living at an apartment building prior to going to jail, but is currently homeless.  Reports that he has some family in this area and his nieces and nephews, but does not have any children of his own.  Reports that he is divorced, also states that he is  a veteran, and could not go to a [facility] due to the government shutdown.  Patient reports that medications listed on the [facility] are currently reflective of what he takes at home for his medical conditions.  He reports wanting gabapentin  and vitamin D3 which he takes at home.  As per chart review, patient takes gabapentin  400 mg daily for nerve pain.  This medication will be ordered in divided  doses of 200 mg twice daily to help with nerve pain and also with alcohol use disorder.  Also ordered the Breo Ellipta long-acting inhaler for pt's asthma.  Patient reports a good sleep quality last night, reports a good appetite, is currently denying depressive symptoms, reports that he has auditory hallucinations at baseline, states that he has a diagnosis of schizoaffective disorder.  Reports that the last time he had auditory hallucinations was last night, but is unable to state when the voices were telling him.  Currently denying auditory hallucinations.  Denies that his auditory hallucinations are in the context of cocaine abuse.  Labs reviewed: Hemoglobin A1c is 5.7, but this is an improvement from a year ago, which was 6.2.  Liver function tests slightly elevated at AST of 62 and ALT of 46 respectively.  Will need to recheck in a few days.  Qtc WNL. Ordering vitamin D level prior to initiating an order for supplementation.  Also ordered vitamin B12.  Continuing all other medications as listed below.  Tentative discharge date is to be determined, as patient reports that plan is for him to go to rehab, and that he has began the talks with CSW.  Writer encouraged him to call from the list provided to him by CSW, verbalized understanding.  Diagnosis:  Final diagnoses:  Cocaine abuse (HCC)  Adjustment disorder with depressed mood  Alcohol use  Substance abuse (HCC)    Total Time spent with patient: 30 minutes  Past Psychiatric History: a reported history of schizoaffective disorder, PTSD and depression. No history of inpatient psychiatric hospitalizations. [facility]    Past Medical History: a medical history of CHF, COPD, hypertension, hyperlipidemia, neuropathy, diabetes type 2 and degenerative disc disease   Family History: No reported history.    Social History: Single. Homelessness. Cocaine use. Alcohol use.  Current Medications:  Current Facility-Administered Medications   Medication Dose Route Frequency Provider Last Rate Last Admin   acetaminophen  (TYLENOL ) tablet 650 mg  650 mg Oral Q6H PRN White, Patrice L, NP       albuterol  (VENTOLIN  HFA) 108 (90 Base) MCG/ACT inhaler 1-2 puff  1-2 puff Inhalation Q6H PRN White, Patrice L, NP   2 puff at 01/18/24 1956   alum & mag hydroxide-simeth (MAALOX/MYLANTA) 200-200-20 MG/5ML suspension 30 mL  30 mL Oral Q4H PRN White, Patrice L, NP       ARIPiprazole  (ABILIFY ) tablet 10 mg  10 mg Oral Daily White, Patrice L, NP   10 mg at 01/18/24 1600   aspirin  EC tablet 81 mg  81 mg Oral Daily White, Patrice L, NP   81 mg at 01/20/24 1014   atorvastatin  (LIPITOR ) tablet 80 mg  80 mg Oral QPM White, Patrice L, NP   80 mg at 01/19/24 1737   digoxin  (LANOXIN ) tablet 0.125 mg  0.125 mg Oral Daily White, Patrice L, NP   0.125 mg at 01/20/24 1014   empagliflozin  (JARDIANCE ) tablet 10 mg  10 mg Oral Daily White, Patrice L, NP   10 mg at 01/20/24 1015   fluticasone  furoate-vilanterol (BREO  ELLIPTA) 100-25 MCG/ACT 1 puff  1 puff Inhalation Daily Fotios Amos, NP       gabapentin  (NEURONTIN ) capsule 200 mg  200 mg Oral BID [place] Drilling, NP       hydrOXYzine (ATARAX) tablet 25 mg  25 mg Oral Q6H PRN White, Patrice L, NP       loperamide (IMODIUM) capsule 2-4 mg  2-4 mg Oral PRN White, Patrice L, NP       LORazepam  (ATIVAN ) tablet 1 mg  1 mg Oral Q6H PRN White, Patrice L, NP       losartan  (COZAAR ) tablet 25 mg  25 mg Oral Daily White, Patrice L, NP   25 mg at 01/20/24 1014   magnesium  hydroxide (MILK OF MAGNESIA) suspension 30 mL  30 mL Oral Daily PRN White, Patrice L, NP       multivitamin with minerals tablet 1 tablet  1 tablet Oral Daily White, Patrice L, NP   1 tablet at 01/18/24 1600   OLANZapine (ZYPREXA) injection 5 mg  5 mg Intramuscular TID PRN White, Patrice L, NP       OLANZapine zydis (ZYPREXA) disintegrating tablet 5 mg  5 mg Oral TID PRN White, Patrice L, NP       ondansetron  (ZOFRAN -ODT) disintegrating tablet 4 mg  4 mg Oral  Q6H PRN White, Patrice L, NP       rivaroxaban  (XARELTO ) tablet 20 mg  20 mg Oral Q supper White, Patrice L, NP   20 mg at 01/19/24 1737   sertraline (ZOLOFT) tablet 25 mg  25 mg Oral QHS Jeree Delcid, NP       spironolactone  (ALDACTONE ) tablet 12.5 mg  12.5 mg Oral Daily White, Patrice L, NP   12.5 mg at 01/20/24 1015   thiamine (VITAMIN B1) tablet 100 mg  100 mg Oral Daily White, Patrice L, NP       torsemide  (DEMADEX ) tablet 20 mg  20 mg Oral Daily White, Patrice L, NP   20 mg at 01/18/24 1600   Current Outpatient Medications  Medication Sig Dispense Refill   albuterol  (VENTOLIN  HFA) 108 (90 Base) MCG/ACT inhaler Inhale 1-2 puffs into the lungs every 6 (six) hours as needed for wheezing or shortness of breath. 18 g 0   ARIPiprazole  (ABILIFY ) 10 MG tablet Take 1 tablet (10 mg total) by mouth daily. 90 tablet 0   aspirin  EC 81 MG tablet Take 1 tablet (81 mg total) by mouth daily. Swallow whole. 90 tablet 1   atorvastatin  (LIPITOR ) 80 MG tablet Take 1 tablet (80 mg total) by mouth daily. 90 tablet 1   cholecalciferol  (VITAMIN D3) 25 MCG (1000 UNIT) tablet Take 5 tablets (5,000 Units total) by mouth daily. 150 tablet 0   citalopram  (CELEXA ) 20 MG tablet Take 1 tablet (20 mg total) by mouth daily. 90 tablet 0   digoxin  (LANOXIN ) 0.125 MG tablet Take 1 tablet (0.125 mg total) by mouth daily. 30 tablet 1   empagliflozin  (JARDIANCE ) 10 MG TABS tablet Take 1 tablet (10 mg total) by mouth daily. 90 tablet 1   gabapentin  (NEURONTIN ) 400 MG capsule Take 1 capsule (400 mg total) by mouth daily as needed (nerve pain). (Patient taking differently: Take 400 mg by mouth 2 (two) times daily as needed (nerve pain).) 90 capsule 0   losartan  (COZAAR ) 25 MG tablet Take 1 tablet (25 mg total) by mouth daily. 90 tablet 1   meloxicam  (MOBIC ) 7.5 MG tablet Take 1 tablet (7.5 mg total) by mouth daily. (  Patient not taking: No sig reported) 5 tablet 0   mometasone -formoterol  (DULERA ) 200-5 MCG/ACT AERO Inhale 2 puffs  into the lungs 2 (two) times daily.     rivaroxaban  (XARELTO ) 20 MG TABS tablet Take 1 tablet (20 mg total) by mouth daily with supper. 60 tablet 0   spironolactone  (ALDACTONE ) 25 MG tablet Take 1 tablet (25 mg total) by mouth daily. 90 tablet 1   torsemide  (DEMADEX ) 20 MG tablet Take 1 tablet (20 mg total) by mouth daily. 90 tablet 1   umeclidinium bromide  (INCRUSE ELLIPTA ) 62.5 MCG/ACT AEPB Inhale 1 puff into the lungs daily.      Labs  Lab Results:  Admission on 01/18/2024, Discharged on 01/18/2024  Component Date Value Ref Range Status   WBC 01/18/2024 4.7  4.0 - 10.5 K/uL Final   RBC 01/18/2024 5.64  4.22 - 5.81 MIL/uL Final   Hemoglobin 01/18/2024 15.8  13.0 - 17.0 g/dL Final   HCT 89/90/7974 50.3  39.0 - 52.0 % Final   MCV 01/18/2024 89.2  80.0 - 100.0 fL Final   MCH 01/18/2024 28.0  26.0 - 34.0 pg Final   MCHC 01/18/2024 31.4  30.0 - 36.0 g/dL Final   RDW 89/90/7974 14.8  11.5 - 15.5 % Final   Platelets 01/18/2024 223  150 - 400 K/uL Final   nRBC 01/18/2024 0.0  0.0 - 0.2 % Final   Neutrophils Relative % 01/18/2024 57  % Final   Neutro Abs 01/18/2024 2.7  1.7 - 7.7 K/uL Final   Lymphocytes Relative 01/18/2024 32  % Final   Lymphs Abs 01/18/2024 1.5  0.7 - 4.0 K/uL Final   Monocytes Relative 01/18/2024 6  % Final   Monocytes Absolute 01/18/2024 0.3  0.1 - 1.0 K/uL Final   Eosinophils Relative 01/18/2024 4  % Final   Eosinophils Absolute 01/18/2024 0.2  0.0 - 0.5 K/uL Final   Basophils Relative 01/18/2024 1  % Final   Basophils Absolute 01/18/2024 0.0  0.0 - 0.1 K/uL Final   Immature Granulocytes 01/18/2024 0  % Final   Abs Immature Granulocytes 01/18/2024 0.01  0.00 - 0.07 K/uL Final   Performed at [facility], [address], [place], [place] [address]   Sodium 01/18/2024 139  135 - 145 mmol/L Final   Potassium 01/18/2024 3.6  3.5 - 5.1 mmol/L Final   Chloride 01/18/2024 99  98 - 111 mmol/L Final   CO2 01/18/2024 29  22 - 32 mmol/L Final   Glucose, Bld 01/18/2024  120 (H)  70 - 99 mg/dL Final   Glucose reference range applies only to samples taken after fasting for at least 8 hours.   BUN 01/18/2024 15  8 - 23 mg/dL Final   Creatinine, Ser 01/18/2024 0.95  0.61 - 1.24 mg/dL Final   Calcium  01/18/2024 9.5  8.9 - 10.3 mg/dL Final   Total Protein 89/90/7974 8.3 (H)  6.5 - 8.1 g/dL Final   Albumin 89/90/7974 4.0  3.5 - 5.0 g/dL Final   AST 89/90/7974 62 (H)  15 - 41 U/L Final   ALT 01/18/2024 46 (H)  0 - 44 U/L Final   Alkaline Phosphatase 01/18/2024 65  38 - 126 U/L Final   Total Bilirubin 01/18/2024 1.2  0.0 - 1.2 mg/dL Final   GFR, Estimated 01/18/2024 >60  >60 mL/min Final   Comment: (NOTE) Calculated using the CKD-EPI Creatinine Equation (2021)    Anion gap 01/18/2024 11  5 - 15 Final   Performed at [facility]  [facility], [address], [place], [place] [address]   Hgb A1c MFr Bld 01/18/2024 5.7 (H)  4.8 - 5.6 % Final   Comment: (NOTE) Diagnosis of Diabetes The following HbA1c ranges recommended by the American Diabetes Association (ADA) may be used as an aid in the diagnosis of diabetes mellitus.  Hemoglobin             Suggested A1C NGSP%              Diagnosis  <5.7                   Non Diabetic  5.7-6.4                Pre-Diabetic  >6.4                   Diabetic  <7.0                   Glycemic control for                       adults with diabetes.     Mean Plasma Glucose 01/18/2024 116.89  mg/dL Final   Performed at [facility], [address] [place], [place] [address]   Alcohol, Ethyl (B) 01/18/2024 <15  <15 mg/dL Final   Comment: (NOTE) For medical purposes only. Performed at [facility], [address], [place], [place] [address]    Cholesterol 01/18/2024 92  0 - 200 mg/dL Final   Triglycerides 89/90/7974 34  <150 mg/dL Final   HDL 89/90/7974 46  >40 mg/dL Final   Total CHOL/HDL Ratio 01/18/2024 2.0  RATIO Final   VLDL 01/18/2024 7  0 - 40 mg/dL Final   LDL Cholesterol 01/18/2024 39  0 - 99 mg/dL  Final   Comment:        Total Cholesterol/HDL:CHD Risk Coronary Heart Disease Risk Table                     Men   Women  1/2 Average Risk   3.4   3.3  Average Risk       5.0   4.4  2 X Average Risk   9.6   7.1  3 X Average Risk  23.4   11.0        Use the calculated Patient Ratio above and the CHD Risk Table to determine the patient's CHD Risk.        ATP III CLASSIFICATION (LDL):  <100     mg/dL   Optimal  899-870  mg/dL   Near or Above                    Optimal  130-159  mg/dL   Borderline  839-810  mg/dL   High  >809     mg/dL   Very High Performed at [facility], [address], [place], [place] [address]    TSH 01/18/2024 0.814  0.350 - 4.500 uIU/mL Final   Comment: Performed by a 3rd Generation assay with a functional sensitivity of <=0.01 uIU/mL. Performed at [facility], [address], [place], [place] [address]    POC Amphetamine UR 01/18/2024 None Detected  NONE DETECTED (Cut Off Level 1000 ng/mL) Final   POC Secobarbital (BAR) 01/18/2024 None Detected  NONE DETECTED (Cut Off Level 300 ng/mL) Final   POC Buprenorphine (BUP) 01/18/2024 None Detected  NONE DETECTED (Cut Off Level 10 ng/mL) Final   POC Oxazepam (BZO) 01/18/2024 None Detected  NONE DETECTED (Cut Off Level 300 ng/mL) Final   POC Cocaine UR 01/18/2024 Positive (A)  NONE DETECTED (Cut Off Level 300 ng/mL) Final   POC Methamphetamine UR 01/18/2024 None Detected  NONE DETECTED (Cut Off Level 1000 ng/mL) Final   POC Morphine  01/18/2024 None Detected  NONE DETECTED (Cut Off Level 300 ng/mL) Final   POC Methadone UR 01/18/2024 None Detected  NONE DETECTED (Cut Off Level 300 ng/mL) Final   POC Oxycodone  UR 01/18/2024 None Detected  NONE DETECTED (Cut Off Level 100 ng/mL) Final   POC Marijuana UR 01/18/2024 None Detected  NONE DETECTED (Cut Off Level 50 ng/mL) Final  Admission on 01/11/2024, Discharged on 01/12/2024  Component Date Value Ref Range Status   Sodium 01/11/2024 141  135 - 145 mmol/L  Final   Potassium 01/11/2024 4.2  3.5 - 5.1 mmol/L Final   Chloride 01/11/2024 104  98 - 111 mmol/L Final   CO2 01/11/2024 23  22 - 32 mmol/L Final   Glucose, Bld 01/11/2024 125 (H)  70 - 99 mg/dL Final   Glucose reference range applies only to samples taken after fasting for at least 8 hours.   BUN 01/11/2024 20  8 - 23 mg/dL Final   Creatinine, Ser 01/11/2024 1.26 (H)  0.61 - 1.24 mg/dL Final   Calcium  01/11/2024 9.1  8.9 - 10.3 mg/dL Final   GFR, Estimated 01/11/2024 >60  >60 mL/min Final   Comment: (NOTE) Calculated using the CKD-EPI Creatinine Equation (2021)    Anion gap 01/11/2024 14  5 - 15 Final   Performed at [facility], [address] [place], [place] [address]   WBC 01/11/2024 5.8  4.0 - 10.5 K/uL Final   RBC 01/11/2024 5.28  4.22 - 5.81 MIL/uL Final   Hemoglobin 01/11/2024 15.0  13.0 - 17.0 g/dL Final   HCT 89/97/7974 47.2  39.0 - 52.0 % Final   MCV 01/11/2024 89.4  80.0 - 100.0 fL Final   MCH 01/11/2024 28.4  26.0 - 34.0 pg Final   MCHC 01/11/2024 31.8  30.0 - 36.0 g/dL Final   RDW 89/97/7974 14.8  11.5 - 15.5 % Final   Platelets 01/11/2024 237  150 - 400 K/uL Final   nRBC 01/11/2024 0.0  0.0 - 0.2 % Final   Performed at [facility], [address], [place], [place] [address]   Troponin I (High Sensitivity) 01/11/2024 23 (H)  <18 ng/L Final   Comment: (NOTE) Elevated high sensitivity troponin I (hsTnI) values and significant  changes across serial measurements may suggest ACS but many other  chronic and acute conditions are known to elevate hsTnI results.  Refer to the Links section for chest pain algorithms and additional  guidance. Performed at [facility], [address], [place], [place] [address]    Troponin I (High Sensitivity) 01/11/2024 19 (H)  <18 ng/L Final   Comment: (NOTE) Elevated high sensitivity troponin I (hsTnI) values and significant  changes across serial measurements may suggest ACS but many other  chronic and acute  conditions are known to elevate hsTnI results.  Refer to the Links section for chest pain algorithms and additional  guidance. Performed at [facility], [address] [place], [place] [address]   Hospital Outpatient Visit on 01/08/2024  Component Date Value Ref Range Status   Sodium 01/08/2024 137  135 - 145 mmol/L Final  Potassium 01/08/2024 4.6  3.5 - 5.1 mmol/L Final   Chloride 01/08/2024 101  98 - 111 mmol/L Final   CO2 01/08/2024 27  22 - 32 mmol/L Final   Glucose, Bld 01/08/2024 90  70 - 99 mg/dL Final   Glucose reference range applies only to samples taken after fasting for at least 8 hours.   BUN 01/08/2024 13  8 - 23 mg/dL Final   Creatinine, Ser 01/08/2024 0.94  0.61 - 1.24 mg/dL Final   Calcium  01/08/2024 9.9  8.9 - 10.3 mg/dL Final   GFR, Estimated 01/08/2024 >60  >60 mL/min Final   Comment: (NOTE) Calculated using the CKD-EPI Creatinine Equation (2021)    Anion gap 01/08/2024 9  5 - 15 Final   Performed at [facility], [address] [place], [place] [address]   B Natriuretic Peptide 01/08/2024 27.6  0.0 - 100.0 pg/mL Final   Performed at [facility], [address], [place], [place] [address]  Admission on 12/29/2023, Discharged on 12/30/2023  Component Date Value Ref Range Status   SARS Coronavirus 2 by RT PCR 12/30/2023 NEGATIVE  NEGATIVE Final   Influenza A by PCR 12/30/2023 NEGATIVE  NEGATIVE Final   Influenza B by PCR 12/30/2023 NEGATIVE  NEGATIVE Final   Comment: (NOTE) The Xpert Xpress SARS-CoV-2/FLU/RSV plus assay is intended as an aid in the diagnosis of influenza from Nasopharyngeal swab specimens and should not be used as a sole basis for treatment. Nasal washings and aspirates are unacceptable for Xpert Xpress SARS-CoV-2/FLU/RSV testing.  Fact Sheet for Patients: BloggerCourse.com  Fact Sheet for Healthcare Providers: SeriousBroker.it  This test is not yet approved or  cleared by the [place]  FDA and has been authorized for detection and/or diagnosis of SARS-CoV-2 by FDA under an Emergency Use Authorization (EUA). This EUA will remain in effect (meaning this test can be used) for the duration of the COVID-19 declaration under Section 564(b)(1) of the Act, 21 U.S.C. section 360bbb-3(b)(1), unless the authorization is terminated or revoked.     Resp Syncytial Virus by PCR 12/30/2023 NEGATIVE  NEGATIVE Final   Comment: (NOTE) Fact Sheet for Patients: BloggerCourse.com  Fact Sheet for Healthcare Providers: SeriousBroker.it  This test is not yet approved or cleared by the [place]  FDA and has been authorized for detection and/or diagnosis of SARS-CoV-2 by FDA under an Emergency Use Authorization (EUA). This EUA will remain in effect (meaning this test can be used) for the duration of the COVID-19 declaration under Section 564(b)(1) of the Act, 21 U.S.C. section 360bbb-3(b)(1), unless the authorization is terminated or revoked.  Performed at [facility], [address] [place], [place] [address]    Sodium 12/30/2023 142  135 - 145 mmol/L Final   Potassium 12/30/2023 4.4  3.5 - 5.1 mmol/L Final   Chloride 12/30/2023 105  98 - 111 mmol/L Final   BUN 12/30/2023 23  8 - 23 mg/dL Final   Creatinine, Ser 12/30/2023 1.20  0.61 - 1.24 mg/dL Final   Glucose, Bld 90/79/7974 126 (H)  70 - 99 mg/dL Final   Glucose reference range applies only to samples taken after fasting for at least 8 hours.   Calcium , Ion 12/30/2023 1.20  1.15 - 1.40 mmol/L Final   TCO2 12/30/2023 28  22 - 32 mmol/L Final   Hemoglobin 12/30/2023 16.0  13.0 - 17.0 g/dL Final   HCT 90/79/7974 47.0  39.0 - 52.0 % Final  Admission on 12/23/2023, Discharged on 12/25/2023  Component  Date Value Ref Range Status   Sodium 12/23/2023 139  135 - 145 mmol/L Final   Potassium 12/23/2023 4.0  3.5 - 5.1 mmol/L Final   Chloride  12/23/2023 103  98 - 111 mmol/L Final   CO2 12/23/2023 25  22 - 32 mmol/L Final   Glucose, Bld 12/23/2023 100 (H)  70 - 99 mg/dL Final   Glucose reference range applies only to samples taken after fasting for at least 8 hours.   BUN 12/23/2023 17  8 - 23 mg/dL Final   Creatinine, Ser 12/23/2023 0.98  0.61 - 1.24 mg/dL Final   Calcium  12/23/2023 9.3  8.9 - 10.3 mg/dL Final   GFR, Estimated 12/23/2023 >60  >60 mL/min Final   Comment: (NOTE) Calculated using the CKD-EPI Creatinine Equation (2021)    Anion gap 12/23/2023 11  5 - 15 Final   Performed at [facility], [address], [place], [place] [address]   WBC 12/23/2023 6.2  4.0 - 10.5 K/uL Final   RBC 12/23/2023 5.30  4.22 - 5.81 MIL/uL Final   Hemoglobin 12/23/2023 15.1  13.0 - 17.0 g/dL Final   HCT 90/86/7974 48.0  39.0 - 52.0 % Final   MCV 12/23/2023 90.6  80.0 - 100.0 fL Final   MCH 12/23/2023 28.5  26.0 - 34.0 pg Final   MCHC 12/23/2023 31.5  30.0 - 36.0 g/dL Final   RDW 90/86/7974 14.6  11.5 - 15.5 % Final   Platelets 12/23/2023 239  150 - 400 K/uL Final   nRBC 12/23/2023 0.0  0.0 - 0.2 % Final   Performed at [facility], [address] [place], [place] [address]   Troponin I (High Sensitivity) 12/23/2023 19 (H)  <18 ng/L Final   Comment: (NOTE) Elevated high sensitivity troponin I (hsTnI) values and significant  changes across serial measurements may suggest ACS but many other  chronic and acute conditions are known to elevate hsTnI results.  Refer to the Links section for chest pain algorithms and additional  guidance. Performed at [facility], [address] [place], [place] [address]    Sodium 12/23/2023 141  135 - 145 mmol/L Final   Potassium 12/23/2023 3.9  3.5 - 5.1 mmol/L Final   Chloride 12/23/2023 105  98 - 111 mmol/L Final   BUN 12/23/2023 20  8 - 23 mg/dL Final   Creatinine, Ser 12/23/2023 1.00  0.61 - 1.24 mg/dL Final   Glucose, Bld 90/86/7974 100 (H)  70 - 99 mg/dL Final   Glucose  reference range applies only to samples taken after fasting for at least 8 hours.   Calcium , Ion 12/23/2023 1.19  1.15 - 1.40 mmol/L Final   TCO2 12/23/2023 24  22 - 32 mmol/L Final   Hemoglobin 12/23/2023 16.7  13.0 - 17.0 g/dL Final   HCT 90/86/7974 49.0  39.0 - 52.0 % Final   Troponin I (High Sensitivity) 12/24/2023 16  <18 ng/L Final   Comment: (NOTE) Elevated high sensitivity troponin I (hsTnI) values and significant  changes across serial measurements may suggest ACS but many other  chronic and acute conditions are known to elevate hsTnI results.  Refer to the Links section for chest pain algorithms and additional  guidance. Performed at [facility], [address], [place], [place] [address]    SARS Coronavirus 2 by RT PCR 12/24/2023 NEGATIVE  NEGATIVE Final   Influenza A by PCR 12/24/2023 NEGATIVE  NEGATIVE Final   Influenza B by PCR  12/24/2023 NEGATIVE  NEGATIVE Final   Comment: (NOTE) The Xpert Xpress SARS-CoV-2/FLU/RSV plus assay is intended as an aid in the diagnosis of influenza from Nasopharyngeal swab specimens and should not be used as a sole basis for treatment. Nasal washings and aspirates are unacceptable for Xpert Xpress SARS-CoV-2/FLU/RSV testing.  Fact Sheet for Patients: BloggerCourse.com  Fact Sheet for Healthcare Providers: SeriousBroker.it  This test is not yet approved or cleared by the [place]  FDA and has been authorized for detection and/or diagnosis of SARS-CoV-2 by FDA under an Emergency Use Authorization (EUA). This EUA will remain in effect (meaning this test can be used) for the duration of the COVID-19 declaration under Section 564(b)(1) of the Act, 21 U.S.C. section 360bbb-3(b)(1), unless the authorization is terminated or revoked.     Resp Syncytial Virus by PCR 12/24/2023 NEGATIVE  NEGATIVE Final   Comment: (NOTE) Fact Sheet for  Patients: BloggerCourse.com  Fact Sheet for Healthcare Providers: SeriousBroker.it  This test is not yet approved or cleared by the [place]  FDA and has been authorized for detection and/or diagnosis of SARS-CoV-2 by FDA under an Emergency Use Authorization (EUA). This EUA will remain in effect (meaning this test can be used) for the duration of the COVID-19 declaration under Section 564(b)(1) of the Act, 21 U.S.C. section 360bbb-3(b)(1), unless the authorization is terminated or revoked.  Performed at [facility], [address], [place], [place] [address]    B Natriuretic Peptide 12/24/2023 11.7  0.0 - 100.0 pg/mL Final   Performed at [facility], [address], [place], [place] [address]   Opiates 12/24/2023 NONE DETECTED  NONE DETECTED Final   Cocaine 12/24/2023 POSITIVE (A)  NONE DETECTED Final   Benzodiazepines 12/24/2023 NONE DETECTED  NONE DETECTED Final   Amphetamines 12/24/2023 NONE DETECTED  NONE DETECTED Final   Tetrahydrocannabinol 12/24/2023 NONE DETECTED  NONE DETECTED Final   Barbiturates 12/24/2023 NONE DETECTED  NONE DETECTED Final   Comment: (NOTE) DRUG SCREEN FOR MEDICAL PURPOSES ONLY.  IF CONFIRMATION IS NEEDED FOR ANY PURPOSE, NOTIFY LAB WITHIN 5 DAYS.  LOWEST DETECTABLE LIMITS FOR URINE DRUG SCREEN Drug Class                     Cutoff (ng/mL) Amphetamine and metabolites    1000 Barbiturate and metabolites    200 Benzodiazepine                 200 Opiates and metabolites        300 Cocaine and metabolites        300 THC                            50 Performed at [facility], [address], [place], [place] [address]    HIV Screen 4th Generation wRfx 12/24/2023 Non Reactive  Non Reactive Final   Performed at [facility], [address], [place], [place] [address]   Weight 12/24/2023 2,504.43  oz Final   Height 12/24/2023 72  in Final   BP 12/24/2023 151/88  mmHg  Final   S' Lateral 12/24/2023 4.10  cm Final   Area-P 1/2 12/24/2023 2.91  cm2 Final   Est EF 12/24/2023 35 - 40%   Final   Digoxin  Level 12/24/2023 <0.6 (L)  0.8 - 2.0 ng/mL Final   Comment: RESULT CONFIRMED BY MANUAL DILUTION Performed at [facility], [address],  [place], [place] [address]    WBC 12/25/2023 5.9  4.0 - 10.5 K/uL Final   RBC 12/25/2023 5.73  4.22 - 5.81 MIL/uL Final   Hemoglobin 12/25/2023 16.2  13.0 - 17.0 g/dL Final   HCT 90/84/7974 50.8  39.0 - 52.0 % Final   MCV 12/25/2023 88.7  80.0 - 100.0 fL Final   MCH 12/25/2023 28.3  26.0 - 34.0 pg Final   MCHC 12/25/2023 31.9  30.0 - 36.0 g/dL Final   RDW 90/84/7974 14.6  11.5 - 15.5 % Final   Platelets 12/25/2023 243  150 - 400 K/uL Final   nRBC 12/25/2023 0.0  0.0 - 0.2 % Final   Performed at [facility], [address], [place], [place] [address]   Sodium 12/25/2023 139  135 - 145 mmol/L Final   Potassium 12/25/2023 3.5  3.5 - 5.1 mmol/L Final   Chloride 12/25/2023 101  98 - 111 mmol/L Final   CO2 12/25/2023 26  22 - 32 mmol/L Final   Glucose, Bld 12/25/2023 109 (H)  70 - 99 mg/dL Final   Glucose reference range applies only to samples taken after fasting for at least 8 hours.   BUN 12/25/2023 22  8 - 23 mg/dL Final   Creatinine, Ser 12/25/2023 1.32 (H)  0.61 - 1.24 mg/dL Final   Calcium  12/25/2023 8.9  8.9 - 10.3 mg/dL Final   GFR, Estimated 12/25/2023 >60  >60 mL/min Final   Comment: (NOTE) Calculated using the CKD-EPI Creatinine Equation (2021)    Anion gap 12/25/2023 12  5 - 15 Final   Performed at [facility], [address], [place], [place] [address]    Blood Alcohol level:  Lab Results  Component Value Date   [facility] <15 01/18/2024   ETH <10 03/15/2022    Metabolic Disorder Labs: Lab Results  Component Value Date   HGBA1C 5.7 (H) 01/18/2024   MPG 116.89 01/18/2024   MPG 131.24 02/26/2022   No results found for: PROLACTIN Lab Results  Component Value Date   CHOL 92  01/18/2024   TRIG 34 01/18/2024   HDL 46 01/18/2024   CHOLHDL 2.0 01/18/2024   VLDL 7 01/18/2024   LDLCALC 39 01/18/2024   LDLCALC 69 07/31/2021    Therapeutic Lab Levels: No results found for: LITHIUM No results found for: VALPROATE No results found for: CBMZ  Physical Findings   AUDIT    Flowsheet Row ED to Hosp-Admission (Discharged) from 01/13/2022 in [facility] 3E HF PCU  Alcohol Use Disorder Identification Test Final Score (AUDIT) 3   PHQ2-9    Flowsheet Row ED from 01/18/2024 in [facility] Most recent reading at 01/20/2024  2:32 PM ED from 01/18/2024 in [facility] Most recent reading at 01/18/2024  2:49 PM  PHQ-2 Total Score 2 6  PHQ-9 Total Score 9 18   Flowsheet Row ED from 01/18/2024 in [facility] Most recent reading at 01/18/2024  2:54 PM ED from 01/18/2024 in [facility] Most recent reading at 01/18/2024 12:01 PM ED from 01/11/2024 in [facility] Emergency Department at [facility] Most recent reading at 01/11/2024  9:40 PM  C-SSRS RISK CATEGORY No Risk No Risk No Risk     Musculoskeletal  Strength & Muscle Tone: within normal limits Gait & Station: normal Patient leans: N/A  Psychiatric Specialty Exam  Presentation  General Appearance:  Casual  Eye Contact: Fair  Speech: Clear  and Coherent  Speech Volume: Normal  Handedness: Right   Mood and Affect  Mood: Anxious; Depressed  Affect: Congruent   Thought Process  Thought Processes: Coherent  Descriptions of Associations:Intact  Orientation:Full (Time, Place and Person)  Thought Content:Logical  Diagnosis of Schizophrenia or Schizoaffective disorder in past: No    Hallucinations:Hallucinations: None  Ideas of Reference:None  Suicidal Thoughts:Suicidal Thoughts: No  Homicidal Thoughts:Homicidal Thoughts: No   Sensorium  Memory: Immediate  Fair; Recent Fair  Judgment: Fair  Insight: Fair   Art therapist  Concentration: Fair  Attention Span: Fair  Recall: Fair  Fund of Knowledge: Fair  Language: Fair   Psychomotor Activity  Psychomotor Activity: Psychomotor Activity: Normal   Assets  Assets: Resilience   Sleep  Sleep: Sleep: Fair    Nutritional Assessment (For OBS and FBC admissions only) Has the patient had a weight loss or gain of 10 pounds or more in the last 3 months?: No Has the patient had a decrease in food intake/or appetite?: No Does the patient have dental problems?: No Does the patient have eating habits or behaviors that may be indicators of an eating disorder including binging or inducing vomiting?: No Has the patient recently lost weight without trying?: 0 Has the patient been eating poorly because of a decreased appetite?: 0 Malnutrition Screening Tool Score: 0   Physical Exam  Physical Exam Vitals and nursing note reviewed.  Cardiovascular:     Rate and Rhythm: Normal rate.  Musculoskeletal:        General: Normal range of motion.     Cervical back: Normal range of motion.  Neurological:     Mental Status: He is alert and oriented to person, place, and time.    Review of Systems  Constitutional: Negative.   HENT: Negative.    Eyes: Negative.   Respiratory: Negative.    Cardiovascular: Negative.   Gastrointestinal: Negative.   Genitourinary: Negative.   Musculoskeletal: Negative.   Neurological: Negative.   Psychiatric/Behavioral:  Positive for depression and substance abuse. Negative for hallucinations, memory loss and suicidal ideas. The patient is nervous/anxious and has insomnia.   All other systems reviewed and are negative.  Blood pressure 122/75, pulse 67, temperature 97.8 F (36.6 C), temperature source Oral, resp. rate 18, SpO2 97%. There is no height or weight on file to calculate BMI.  Treatment Plan Summary: Ameet Sandy is a 63 year old  male patient with a reported psychiatric history significant for schizoaffective disorder, PTSD and depression and a medical history of CHF, COPD, hypertension, hyperlipidemia, neuropathy, diabetes type 2 and degenerative disc disease who presented to the [facility] voluntary unaccompanied with complaints of worsening depression and substance abuse, stating I need help emotionally. Patient to be admitted to the facility-based crisis center for mood stabilization and substance abuse treatment.    Labs UDS positive for cocaine. BAL negative    Home medications  Continue Abilify  10 mg by mouth daily for mood stabilization and hx of schizoaffective disorder Celexa  20 mg daily Continue albuterol  1 to 2 puffs every 6 hours as needed for shortness of breath/asthma Continue aspirin  81 mg p.o. daily for extensive cardiac history Continue digoxin  0.125 mg p.o. daily for history of CHF Continue losartan  25 mg p.o. daily for history of hypertension Continue Xarelto  20 mg p.o. daily for cardiac history/prevent clots  Continue spironolactone  12.5 mg daily for cardiac history/CHF Continue torsemide  20 mg daily for cardiac history/CHF Continue atorvastatin  80 mg daily for hyperlipidemia Continue Jardiance  10  mg p.o. daily for history of diabetes type 2 Continue add on ativan  1 mg every 6 hours prn for CIWA greater than 10 for alcohol withdrawal   Medication adjustments for today: --Discontinue Celexa  20 mg daily due to pre-existing cardiac conditions which might render this medication not suitable due to its tendency for more QT prolongation. -Start gabapentin  200 mg twice daily for nerve pain and alcohol use disorder. -Start Breo Ellipta Inhaler BID for asthma - Start Zoloft 25 mg nightly for depressive symptoms and GAD   Screening Tools CIWA to monitor of alcohol withdrawal   CIWA score 0  Disposition, pending, patient is interested in residential substance abuse  rehabilitation. Educated  on the need to call from the list provided to him by CSW, encouraged to continue to work with CSW to pinpoint a rehabilitation that is appropriate for his needs.  Donia Snell, NP 01/20/2024 2:32 PM
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
## 2024-01-20 NOTE — ED Notes (Signed)
 Pt is sleeping at the moment. No acute distress noted. Respirations are even and labored. Q15 safety checks in place.

## 2024-01-20 NOTE — ED Notes (Signed)
 Pt is sleeping at the moment in his room. No acute distress noted. Q15 safety checks in place.

## 2024-01-21 DIAGNOSIS — F1910 Other psychoactive substance abuse, uncomplicated: Secondary | ICD-10-CM | POA: Diagnosis not present

## 2024-01-21 DIAGNOSIS — F4321 Adjustment disorder with depressed mood: Secondary | ICD-10-CM | POA: Diagnosis not present

## 2024-01-21 DIAGNOSIS — F1410 Cocaine abuse, uncomplicated: Secondary | ICD-10-CM | POA: Diagnosis not present

## 2024-01-21 DIAGNOSIS — F1090 Alcohol use, unspecified, uncomplicated: Secondary | ICD-10-CM | POA: Diagnosis not present

## 2024-01-21 MED ORDER — CITALOPRAM HYDROBROMIDE 10 MG PO TABS
10.0000 mg | ORAL_TABLET | Freq: Every day | ORAL | Status: DC
  Administered 2024-01-21 – 2024-01-22 (×2): 10 mg via ORAL
  Filled 2024-01-21 (×3): qty 1
  Filled 2024-01-21: qty 7

## 2024-01-21 NOTE — ED Notes (Signed)
 Patient A&Ox4. Denies intent to harm self/others when asked. Denies A/VH. Patient denies any physical complaints when asked. No acute distress noted. Support and encouragement provided. Routine safety checks conducted according to facility protocol. Encouraged patient to notify staff if thoughts of harm toward self or others arise. Patient verbalize understanding and agreement. Will continue to monitor for safety.

## 2024-01-21 NOTE — Progress Notes (Signed)
 Meal given

## 2024-01-21 NOTE — ED Notes (Signed)
 Pt is sleeping. No acute distress noted. Respirations are even and labored. Q15 safety checks in place.

## 2024-01-21 NOTE — ED Provider Notes (Signed)
 Behavioral Health Progress Note  Date and Time: 01/21/2024 9:42 AM Name: Allen Mayer MRN:  [id number]  Subjective:  Allen Mayer stated  I am not taking Zoloft, my ex-girlfriend was on that medication and she was crazy.  [name] 63 year old African-American male was seen and evaluated face-to-face by this provider.  He reports overall his mood is stabilized.  Was reported that patient had declined taking Abilify  and Zoloft on yesterday.  States he was unsure how he was placed on Zoloft when he was taken Celexa  in the past.  Felt that Abilify  and Celexa  was the  good medication combination.  States he was previously on Invega  due to diagnoses related to schizoaffective disorder after discharging from the [facility]. Allen Mayer is denying suicidal or homicidal ideations.   Allen Mayer denies auditory or visual hallucinations.  Reports some concern and apprehension with attending a residential treatment facility.  States  I cannot be locked away for 20 or 30 days I have things to do in appointments in the area.  Patient reports he is interested in attending [facility] which is local.  States he has multiple appointments through heart and vascular in the [employer] [facility].  States he is hopeful to follow-up with [facility] for housing.  He reports a good appetite.  States he is resting well throughout the night.  Denied any cravings or withdrawal symptoms during this assessment.  Social work to follow-up for additional discharge disposition needs.  Support, encouragement and reassurance was provided.  Diagnosis:  Final diagnoses:  Cocaine abuse (HCC)  Adjustment disorder with depressed mood  Alcohol use  Substance abuse (HCC)    Total Time spent with patient: 20 minutes  Copied from chart:past Psychiatric History: a reported history of schizoaffective disorder, PTSD and depression. No history of inpatient psychiatric hospitalizations. [facility].    Past Medical History:  a medical history of CHF, COPD, hypertension, hyperlipidemia, neuropathy, diabetes type 2 and degenerative disc disease   Family History: No reported history.    Social History: Single. Homelessness. Cocaine use. Alcohol use  Additional Social History:        Sleep: Fair  Appetite:  Good  Current Medications:  Current Facility-Administered Medications  Medication Dose Route Frequency Provider Last Rate Last Admin   acetaminophen  (TYLENOL ) tablet 650 mg  650 mg Oral Q6H PRN White, Patrice L, NP       albuterol  (VENTOLIN  HFA) 108 (90 Base) MCG/ACT inhaler 1-2 puff  1-2 puff Inhalation Q6H PRN White, Patrice L, NP   1 puff at 01/21/24 0914   alum & mag hydroxide-simeth (MAALOX/MYLANTA) 200-200-20 MG/5ML suspension 30 mL  30 mL Oral Q4H PRN White, Patrice L, NP       ARIPiprazole  (ABILIFY ) tablet 10 mg  10 mg Oral Daily White, Patrice L, NP   10 mg at 01/18/24 1600   aspirin  EC tablet 81 mg  81 mg Oral Daily White, Patrice L, NP   81 mg at 01/21/24 9090   atorvastatin  (LIPITOR ) tablet 80 mg  80 mg Oral QPM White, Patrice L, NP   80 mg at 01/20/24 1725   digoxin  (LANOXIN ) tablet 0.125 mg  0.125 mg Oral Daily White, Patrice L, NP   0.125 mg at 01/21/24 0908   empagliflozin  (JARDIANCE ) tablet 10 mg  10 mg Oral Daily White, Patrice L, NP   10 mg at 01/21/24 0909   fluticasone  furoate-vilanterol (BREO ELLIPTA) 100-25 MCG/ACT 1 puff  1 puff Inhalation Daily Nkwenti, Doris, NP   1 puff at  01/20/24 1530   gabapentin  (NEURONTIN ) capsule 200 mg  200 mg Oral BID Tex Drilling, NP   200 mg at 01/21/24 0908   hydrOXYzine (ATARAX) tablet 25 mg  25 mg Oral Q6H PRN White, Patrice L, NP       loperamide (IMODIUM) capsule 2-4 mg  2-4 mg Oral PRN White, Patrice L, NP       LORazepam  (ATIVAN ) tablet 1 mg  1 mg Oral Q6H PRN White, Patrice L, NP       losartan  (COZAAR ) tablet 25 mg  25 mg Oral Daily White, Patrice L, NP   25 mg at 01/21/24 9090   magnesium  hydroxide (MILK OF MAGNESIA) suspension 30 mL  30 mL  Oral Daily PRN White, Patrice L, NP       multivitamin with minerals tablet 1 tablet  1 tablet Oral Daily White, Patrice L, NP   1 tablet at 01/18/24 1600   OLANZapine (ZYPREXA) injection 5 mg  5 mg Intramuscular TID PRN White, Patrice L, NP       OLANZapine zydis (ZYPREXA) disintegrating tablet 5 mg  5 mg Oral TID PRN White, Patrice L, NP       ondansetron  (ZOFRAN -ODT) disintegrating tablet 4 mg  4 mg Oral Q6H PRN White, Patrice L, NP       rivaroxaban  (XARELTO ) tablet 20 mg  20 mg Oral Q supper White, Patrice L, NP   20 mg at 01/20/24 1625   spironolactone  (ALDACTONE ) tablet 12.5 mg  12.5 mg Oral Daily White, Patrice L, NP   12.5 mg at 01/21/24 9090   thiamine (VITAMIN B1) tablet 100 mg  100 mg Oral Daily White, Patrice L, NP       torsemide  (DEMADEX ) tablet 20 mg  20 mg Oral Daily White, Patrice L, NP   20 mg at 01/18/24 1600   Current Outpatient Medications  Medication Sig Dispense Refill   albuterol  (VENTOLIN  HFA) 108 (90 Base) MCG/ACT inhaler Inhale 1-2 puffs into the lungs every 6 (six) hours as needed for wheezing or shortness of breath. 18 g 0   ARIPiprazole  (ABILIFY ) 10 MG tablet Take 1 tablet (10 mg total) by mouth daily. 90 tablet 0   aspirin  EC 81 MG tablet Take 1 tablet (81 mg total) by mouth daily. Swallow whole. 90 tablet 1   atorvastatin  (LIPITOR ) 80 MG tablet Take 1 tablet (80 mg total) by mouth daily. 90 tablet 1   cholecalciferol  (VITAMIN D3) 25 MCG (1000 UNIT) tablet Take 5 tablets (5,000 Units total) by mouth daily. 150 tablet 0   citalopram  (CELEXA ) 20 MG tablet Take 1 tablet (20 mg total) by mouth daily. 90 tablet 0   digoxin  (LANOXIN ) 0.125 MG tablet Take 1 tablet (0.125 mg total) by mouth daily. 30 tablet 1   empagliflozin  (JARDIANCE ) 10 MG TABS tablet Take 1 tablet (10 mg total) by mouth daily. 90 tablet 1   gabapentin  (NEURONTIN ) 400 MG capsule Take 1 capsule (400 mg total) by mouth daily as needed (nerve pain). (Patient taking differently: Take 400 mg by mouth 2 (two)  times daily as needed (nerve pain).) 90 capsule 0   losartan  (COZAAR ) 25 MG tablet Take 1 tablet (25 mg total) by mouth daily. 90 tablet 1   meloxicam  (MOBIC ) 7.5 MG tablet Take 1 tablet (7.5 mg total) by mouth daily. (Patient not taking: No sig reported) 5 tablet 0   mometasone -formoterol  (DULERA ) 200-5 MCG/ACT AERO Inhale 2 puffs into the lungs 2 (two) times daily.  rivaroxaban  (XARELTO ) 20 MG TABS tablet Take 1 tablet (20 mg total) by mouth daily with supper. 60 tablet 0   spironolactone  (ALDACTONE ) 25 MG tablet Take 1 tablet (25 mg total) by mouth daily. 90 tablet 1   torsemide  (DEMADEX ) 20 MG tablet Take 1 tablet (20 mg total) by mouth daily. 90 tablet 1   umeclidinium bromide  (INCRUSE ELLIPTA ) 62.5 MCG/ACT AEPB Inhale 1 puff into the lungs daily.      Labs  Lab Results:  Admission on 01/18/2024  Component Date Value Ref Range Status   Vit D, 25-Hydroxy 01/20/2024 38.23  30 - 100 ng/mL Final   Comment: (NOTE) Vitamin D deficiency has been defined by the Institute of Medicine  and an Endocrine Society practice guideline as a level of serum 25-OH  vitamin D less than 20 ng/mL (1,2). The Endocrine Society went on to  further define vitamin D insufficiency as a level between 21 and 29  ng/mL (2).  1. IOM (Institute of Medicine). 2010. Dietary reference intakes for  calcium  and D. [place]: [employer]. 2. Holick MF, Binkley [place], Bischoff-Ferrari HA, et al. Evaluation,  treatment, and prevention of vitamin D deficiency: an Endocrine  Society clinical practice guideline, JCEM. 2011 Jul; 96(7): 1911-30.  Performed at [facility] Lab, [address], [place], [place] [address]    Vitamin B-12 01/20/2024 366  180 - 914 pg/mL Final   Comment: (NOTE) This assay is not validated for testing neonatal or myeloproliferative syndrome specimens for Vitamin B12 levels. Performed at [facility], [address], [place], [place] [address]   Admission on  01/18/2024, Discharged on 01/18/2024  Component Date Value Ref Range Status   WBC 01/18/2024 4.7  4.0 - 10.5 K/uL Final   RBC 01/18/2024 5.64  4.22 - 5.81 MIL/uL Final   Hemoglobin 01/18/2024 15.8  13.0 - 17.0 g/dL Final   HCT 89/90/7974 50.3  39.0 - 52.0 % Final   MCV 01/18/2024 89.2  80.0 - 100.0 fL Final   MCH 01/18/2024 28.0  26.0 - 34.0 pg Final   MCHC 01/18/2024 31.4  30.0 - 36.0 g/dL Final   RDW 89/90/7974 14.8  11.5 - 15.5 % Final   Platelets 01/18/2024 223  150 - 400 K/uL Final   nRBC 01/18/2024 0.0  0.0 - 0.2 % Final   Neutrophils Relative % 01/18/2024 57  % Final   Neutro Abs 01/18/2024 2.7  1.7 - 7.7 K/uL Final   Lymphocytes Relative 01/18/2024 32  % Final   Lymphs Abs 01/18/2024 1.5  0.7 - 4.0 K/uL Final   Monocytes Relative 01/18/2024 6  % Final   Monocytes Absolute 01/18/2024 0.3  0.1 - 1.0 K/uL Final   Eosinophils Relative 01/18/2024 4  % Final   Eosinophils Absolute 01/18/2024 0.2  0.0 - 0.5 K/uL Final   Basophils Relative 01/18/2024 1  % Final   Basophils Absolute 01/18/2024 0.0  0.0 - 0.1 K/uL Final   Immature Granulocytes 01/18/2024 0  % Final   Abs Immature Granulocytes 01/18/2024 0.01  0.00 - 0.07 K/uL Final   Performed at [facility], [address] [place], [place] [address]   Sodium 01/18/2024 139  135 - 145 mmol/L Final   Potassium 01/18/2024 3.6  3.5 - 5.1 mmol/L Final   Chloride 01/18/2024 99  98 - 111 mmol/L Final   CO2 01/18/2024 29  22 - 32 mmol/L Final   Glucose, Bld 01/18/2024 120 (H)  70 - 99  mg/dL Final   Glucose reference range applies only to samples taken after fasting for at least 8 hours.   BUN 01/18/2024 15  8 - 23 mg/dL Final   Creatinine, Ser 01/18/2024 0.95  0.61 - 1.24 mg/dL Final   Calcium  01/18/2024 9.5  8.9 - 10.3 mg/dL Final   Total Protein 89/90/7974 8.3 (H)  6.5 - 8.1 g/dL Final   Albumin 89/90/7974 4.0  3.5 - 5.0 g/dL Final   AST 89/90/7974 62 (H)  15 - 41 U/L Final   ALT 01/18/2024 46 (H)  0 - 44 U/L Final   Alkaline  Phosphatase 01/18/2024 65  38 - 126 U/L Final   Total Bilirubin 01/18/2024 1.2  0.0 - 1.2 mg/dL Final   GFR, Estimated 01/18/2024 >60  >60 mL/min Final   Comment: (NOTE) Calculated using the CKD-EPI Creatinine Equation (2021)    Anion gap 01/18/2024 11  5 - 15 Final   Performed at [facility], [address] [place], [place] [address]   Hgb A1c MFr Bld 01/18/2024 5.7 (H)  4.8 - 5.6 % Final   Comment: (NOTE) Diagnosis of Diabetes The following HbA1c ranges recommended by the American Diabetes Association (ADA) may be used as an aid in the diagnosis of diabetes mellitus.  Hemoglobin             Suggested A1C NGSP%              Diagnosis  <5.7                   Non Diabetic  5.7-6.4                Pre-Diabetic  >6.4                   Diabetic  <7.0                   Glycemic control for                       adults with diabetes.     Mean Plasma Glucose 01/18/2024 116.89  mg/dL Final   Performed at [facility], [address], [place], [place] [address]   Alcohol, Ethyl (B) 01/18/2024 <15  <15 mg/dL Final   Comment: (NOTE) For medical purposes only. Performed at [facility], [address], [place], [place] [address]    Cholesterol 01/18/2024 92  0 - 200 mg/dL Final   Triglycerides 89/90/7974 34  <150 mg/dL Final   HDL 89/90/7974 46  >40 mg/dL Final   Total CHOL/HDL Ratio 01/18/2024 2.0  RATIO Final   VLDL 01/18/2024 7  0 - 40 mg/dL Final   LDL Cholesterol 01/18/2024 39  0 - 99 mg/dL Final   Comment:        Total Cholesterol/HDL:CHD Risk Coronary Heart Disease Risk Table                     Men   Women  1/2 Average Risk   3.4   3.3  Average Risk       5.0   4.4  2 X Average Risk   9.6   7.1  3 X Average Risk  23.4   11.0        Use the calculated Patient Ratio above and the CHD Risk Table to determine the patient's CHD Risk.        ATP III  CLASSIFICATION (LDL):  <100     mg/dL   Optimal  899-870  mg/dL   Near or Above                     Optimal  130-159  mg/dL   Borderline  839-810  mg/dL   High  >809     mg/dL   Very High Performed at [facility], [address], [place], [place] [address]    TSH 01/18/2024 0.814  0.350 - 4.500 uIU/mL Final   Comment: Performed by a 3rd Generation assay with a functional sensitivity of <=0.01 uIU/mL. Performed at [facility], [address], [place], [place] [address]    POC Amphetamine UR 01/18/2024 None Detected  NONE DETECTED (Cut Off Level 1000 ng/mL) Final   POC Secobarbital (BAR) 01/18/2024 None Detected  NONE DETECTED (Cut Off Level 300 ng/mL) Final   POC Buprenorphine (BUP) 01/18/2024 None Detected  NONE DETECTED (Cut Off Level 10 ng/mL) Final   POC Oxazepam (BZO) 01/18/2024 None Detected  NONE DETECTED (Cut Off Level 300 ng/mL) Final   POC Cocaine UR 01/18/2024 Positive (A)  NONE DETECTED (Cut Off Level 300 ng/mL) Final   POC Methamphetamine UR 01/18/2024 None Detected  NONE DETECTED (Cut Off Level 1000 ng/mL) Final   POC Morphine  01/18/2024 None Detected  NONE DETECTED (Cut Off Level 300 ng/mL) Final   POC Methadone UR 01/18/2024 None Detected  NONE DETECTED (Cut Off Level 300 ng/mL) Final   POC Oxycodone  UR 01/18/2024 None Detected  NONE DETECTED (Cut Off Level 100 ng/mL) Final   POC Marijuana UR 01/18/2024 None Detected  NONE DETECTED (Cut Off Level 50 ng/mL) Final  Admission on 01/11/2024, Discharged on 01/12/2024  Component Date Value Ref Range Status   Sodium 01/11/2024 141  135 - 145 mmol/L Final   Potassium 01/11/2024 4.2  3.5 - 5.1 mmol/L Final   Chloride 01/11/2024 104  98 - 111 mmol/L Final   CO2 01/11/2024 23  22 - 32 mmol/L Final   Glucose, Bld 01/11/2024 125 (H)  70 - 99 mg/dL Final   Glucose reference range applies only to samples taken after fasting for at least 8 hours.   BUN 01/11/2024 20  8 - 23 mg/dL Final   Creatinine, Ser 01/11/2024 1.26 (H)  0.61 - 1.24 mg/dL Final   Calcium  01/11/2024 9.1  8.9 - 10.3 mg/dL Final   GFR, Estimated  01/11/2024 >60  >60 mL/min Final   Comment: (NOTE) Calculated using the CKD-EPI Creatinine Equation (2021)    Anion gap 01/11/2024 14  5 - 15 Final   Performed at [facility] Lab, [address], [place], [place] [address]   WBC 01/11/2024 5.8  4.0 - 10.5 K/uL Final   RBC 01/11/2024 5.28  4.22 - 5.81 MIL/uL Final   Hemoglobin 01/11/2024 15.0  13.0 - 17.0 g/dL Final   HCT 89/97/7974 47.2  39.0 - 52.0 % Final   MCV 01/11/2024 89.4  80.0 - 100.0 fL Final   MCH 01/11/2024 28.4  26.0 - 34.0 pg Final   MCHC 01/11/2024 31.8  30.0 - 36.0 g/dL Final   RDW 89/97/7974 14.8  11.5 - 15.5 % Final   Platelets 01/11/2024 237  150 - 400 K/uL Final   nRBC 01/11/2024 0.0  0.0 - 0.2 % Final   Performed at [facility], [address], [place], [place] [address]   Troponin I (High Sensitivity) 01/11/2024 23 (H)  <18 ng/L  Final   Comment: (NOTE) Elevated high sensitivity troponin I (hsTnI) values and significant  changes across serial measurements may suggest ACS but many other  chronic and acute conditions are known to elevate hsTnI results.  Refer to the Links section for chest pain algorithms and additional  guidance. Performed at [facility], [address] [place], [place] [address]    Troponin I (High Sensitivity) 01/11/2024 19 (H)  <18 ng/L Final   Comment: (NOTE) Elevated high sensitivity troponin I (hsTnI) values and significant  changes across serial measurements may suggest ACS but many other  chronic and acute conditions are known to elevate hsTnI results.  Refer to the Links section for chest pain algorithms and additional  guidance. Performed at [facility], [address], [place], [place] [address]   Hospital Outpatient Visit on 01/08/2024  Component Date Value Ref Range Status   Sodium 01/08/2024 137  135 - 145 mmol/L Final   Potassium 01/08/2024 4.6  3.5 - 5.1 mmol/L Final   Chloride 01/08/2024 101  98 - 111 mmol/L Final   CO2 01/08/2024 27  22 - 32  mmol/L Final   Glucose, Bld 01/08/2024 90  70 - 99 mg/dL Final   Glucose reference range applies only to samples taken after fasting for at least 8 hours.   BUN 01/08/2024 13  8 - 23 mg/dL Final   Creatinine, Ser 01/08/2024 0.94  0.61 - 1.24 mg/dL Final   Calcium  01/08/2024 9.9  8.9 - 10.3 mg/dL Final   GFR, Estimated 01/08/2024 >60  >60 mL/min Final   Comment: (NOTE) Calculated using the CKD-EPI Creatinine Equation (2021)    Anion gap 01/08/2024 9  5 - 15 Final   Performed at [facility], [address], [place], [place] [address]   B Natriuretic Peptide 01/08/2024 27.6  0.0 - 100.0 pg/mL Final   Performed at [facility], [address] [place], [place] [address]  Admission on 12/29/2023, Discharged on 12/30/2023  Component Date Value Ref Range Status   SARS Coronavirus 2 by RT PCR 12/30/2023 NEGATIVE  NEGATIVE Final   Influenza A by PCR 12/30/2023 NEGATIVE  NEGATIVE Final   Influenza B by PCR 12/30/2023 NEGATIVE  NEGATIVE Final   Comment: (NOTE) The Xpert Xpress SARS-CoV-2/FLU/RSV plus assay is intended as an aid in the diagnosis of influenza from Nasopharyngeal swab specimens and should not be used as a sole basis for treatment. Nasal washings and aspirates are unacceptable for Xpert Xpress SARS-CoV-2/FLU/RSV testing.  Fact Sheet for Patients: BloggerCourse.com  Fact Sheet for Healthcare Providers: SeriousBroker.it  This test is not yet approved or cleared by the [place]  FDA and has been authorized for detection and/or diagnosis of SARS-CoV-2 by FDA under an Emergency Use Authorization (EUA). This EUA will remain in effect (meaning this test can be used) for the duration of the COVID-19 declaration under Section 564(b)(1) of the Act, 21 U.S.C. section 360bbb-3(b)(1), unless the authorization is terminated or revoked.     Resp Syncytial Virus by PCR 12/30/2023 NEGATIVE  NEGATIVE Final   Comment:  (NOTE) Fact Sheet for Patients: BloggerCourse.com  Fact Sheet for Healthcare Providers: SeriousBroker.it  This test is not yet approved or cleared by the [place]  FDA and has been authorized for detection and/or diagnosis of SARS-CoV-2 by FDA under an Emergency Use Authorization (EUA). This EUA will remain in effect (meaning this test can be used) for the duration of the COVID-19 declaration under Section 564(b)(1) of the  Act, 21 U.S.C. section 360bbb-3(b)(1), unless the authorization is terminated or revoked.  Performed at [facility], [address] [place], [place] [address]    Sodium 12/30/2023 142  135 - 145 mmol/L Final   Potassium 12/30/2023 4.4  3.5 - 5.1 mmol/L Final   Chloride 12/30/2023 105  98 - 111 mmol/L Final   BUN 12/30/2023 23  8 - 23 mg/dL Final   Creatinine, Ser 12/30/2023 1.20  0.61 - 1.24 mg/dL Final   Glucose, Bld 90/79/7974 126 (H)  70 - 99 mg/dL Final   Glucose reference range applies only to samples taken after fasting for at least 8 hours.   Calcium , Ion 12/30/2023 1.20  1.15 - 1.40 mmol/L Final   TCO2 12/30/2023 28  22 - 32 mmol/L Final   Hemoglobin 12/30/2023 16.0  13.0 - 17.0 g/dL Final   HCT 90/79/7974 47.0  39.0 - 52.0 % Final  Admission on 12/23/2023, Discharged on 12/25/2023  Component Date Value Ref Range Status   Sodium 12/23/2023 139  135 - 145 mmol/L Final   Potassium 12/23/2023 4.0  3.5 - 5.1 mmol/L Final   Chloride 12/23/2023 103  98 - 111 mmol/L Final   CO2 12/23/2023 25  22 - 32 mmol/L Final   Glucose, Bld 12/23/2023 100 (H)  70 - 99 mg/dL Final   Glucose reference range applies only to samples taken after fasting for at least 8 hours.   BUN 12/23/2023 17  8 - 23 mg/dL Final   Creatinine, Ser 12/23/2023 0.98  0.61 - 1.24 mg/dL Final   Calcium  12/23/2023 9.3  8.9 - 10.3 mg/dL Final   GFR, Estimated 12/23/2023 >60  >60 mL/min Final   Comment: (NOTE) Calculated using the  CKD-EPI Creatinine Equation (2021)    Anion gap 12/23/2023 11  5 - 15 Final   Performed at [facility], [address], [place], [place] [address]   WBC 12/23/2023 6.2  4.0 - 10.5 K/uL Final   RBC 12/23/2023 5.30  4.22 - 5.81 MIL/uL Final   Hemoglobin 12/23/2023 15.1  13.0 - 17.0 g/dL Final   HCT 90/86/7974 48.0  39.0 - 52.0 % Final   MCV 12/23/2023 90.6  80.0 - 100.0 fL Final   MCH 12/23/2023 28.5  26.0 - 34.0 pg Final   MCHC 12/23/2023 31.5  30.0 - 36.0 g/dL Final   RDW 90/86/7974 14.6  11.5 - 15.5 % Final   Platelets 12/23/2023 239  150 - 400 K/uL Final   nRBC 12/23/2023 0.0  0.0 - 0.2 % Final   Performed at [facility] Lab, [address], [place], [place] [address]   Troponin I (High Sensitivity) 12/23/2023 19 (H)  <18 ng/L Final   Comment: (NOTE) Elevated high sensitivity troponin I (hsTnI) values and significant  changes across serial measurements may suggest ACS but many other  chronic and acute conditions are known to elevate hsTnI results.  Refer to the Links section for chest pain algorithms and additional  guidance. Performed at [facility], [address], [place], [place] [address]    Sodium 12/23/2023 141  135 - 145 mmol/L Final   Potassium 12/23/2023 3.9  3.5 - 5.1 mmol/L Final   Chloride 12/23/2023 105  98 - 111 mmol/L Final   BUN 12/23/2023 20  8 - 23 mg/dL Final   Creatinine, Ser 12/23/2023 1.00  0.61 - 1.24 mg/dL Final   Glucose, Bld 90/86/7974 100 (H)  70 - 99 mg/dL Final  Glucose reference range applies only to samples taken after fasting for at least 8 hours.   Calcium , Ion 12/23/2023 1.19  1.15 - 1.40 mmol/L Final   TCO2 12/23/2023 24  22 - 32 mmol/L Final   Hemoglobin 12/23/2023 16.7  13.0 - 17.0 g/dL Final   HCT 90/86/7974 49.0  39.0 - 52.0 % Final   Troponin I (High Sensitivity) 12/24/2023 16  <18 ng/L Final   Comment: (NOTE) Elevated high sensitivity troponin I (hsTnI) values and significant  changes across serial measurements may  suggest ACS but many other  chronic and acute conditions are known to elevate hsTnI results.  Refer to the Links section for chest pain algorithms and additional  guidance. Performed at [facility] Lab, [address], [place], [place] [address]    SARS Coronavirus 2 by RT PCR 12/24/2023 NEGATIVE  NEGATIVE Final   Influenza A by PCR 12/24/2023 NEGATIVE  NEGATIVE Final   Influenza B by PCR 12/24/2023 NEGATIVE  NEGATIVE Final   Comment: (NOTE) The Xpert Xpress SARS-CoV-2/FLU/RSV plus assay is intended as an aid in the diagnosis of influenza from Nasopharyngeal swab specimens and should not be used as a sole basis for treatment. Nasal washings and aspirates are unacceptable for Xpert Xpress SARS-CoV-2/FLU/RSV testing.  Fact Sheet for Patients: BloggerCourse.com  Fact Sheet for Healthcare Providers: SeriousBroker.it  This test is not yet approved or cleared by the [place]  FDA and has been authorized for detection and/or diagnosis of SARS-CoV-2 by FDA under an Emergency Use Authorization (EUA). This EUA will remain in effect (meaning this test can be used) for the duration of the COVID-19 declaration under Section 564(b)(1) of the Act, 21 U.S.C. section 360bbb-3(b)(1), unless the authorization is terminated or revoked.     Resp Syncytial Virus by PCR 12/24/2023 NEGATIVE  NEGATIVE Final   Comment: (NOTE) Fact Sheet for Patients: BloggerCourse.com  Fact Sheet for Healthcare Providers: SeriousBroker.it  This test is not yet approved or cleared by the [place]  FDA and has been authorized for detection and/or diagnosis of SARS-CoV-2 by FDA under an Emergency Use Authorization (EUA). This EUA will remain in effect (meaning this test can be used) for the duration of the COVID-19 declaration under Section 564(b)(1) of the Act, 21 U.S.C. section 360bbb-3(b)(1), unless the  authorization is terminated or revoked.  Performed at [facility] Lab, [address], [place], [place] [address]    B Natriuretic Peptide 12/24/2023 11.7  0.0 - 100.0 pg/mL Final   Performed at [facility] Lab, [address] [place], [place] [address]   Opiates 12/24/2023 NONE DETECTED  NONE DETECTED Final   Cocaine 12/24/2023 POSITIVE (A)  NONE DETECTED Final   Benzodiazepines 12/24/2023 NONE DETECTED  NONE DETECTED Final   Amphetamines 12/24/2023 NONE DETECTED  NONE DETECTED Final   Tetrahydrocannabinol 12/24/2023 NONE DETECTED  NONE DETECTED Final   Barbiturates 12/24/2023 NONE DETECTED  NONE DETECTED Final   Comment: (NOTE) DRUG SCREEN FOR MEDICAL PURPOSES ONLY.  IF CONFIRMATION IS NEEDED FOR ANY PURPOSE, NOTIFY LAB WITHIN 5 DAYS.  LOWEST DETECTABLE LIMITS FOR URINE DRUG SCREEN Drug Class                     Cutoff (ng/mL) Amphetamine and metabolites    1000 Barbiturate and metabolites    200 Benzodiazepine                 200 Opiates and metabolites        300 Cocaine and  metabolites        300 THC                            50 Performed at [facility], [address], [place], [place] [address]    HIV Screen 4th Generation wRfx 12/24/2023 Non Reactive  Non Reactive Final   Performed at [facility], [address] [place], [place] [address]   Weight 12/24/2023 2,504.43  oz Final   Height 12/24/2023 72  in Final   BP 12/24/2023 151/88  mmHg Final   S' Lateral 12/24/2023 4.10  cm Final   Area-P 1/2 12/24/2023 2.91  cm2 Final   Est EF 12/24/2023 35 - 40%   Final   Digoxin  Level 12/24/2023 <0.6 (L)  0.8 - 2.0 ng/mL Final   Comment: RESULT CONFIRMED BY MANUAL DILUTION Performed at [facility], [address], [place], [place] [address]    WBC 12/25/2023 5.9  4.0 - 10.5 K/uL Final   RBC 12/25/2023 5.73  4.22 - 5.81 MIL/uL Final   Hemoglobin 12/25/2023 16.2  13.0 - 17.0 g/dL Final   HCT 90/84/7974 50.8  39.0 - 52.0 % Final   MCV 12/25/2023  88.7  80.0 - 100.0 fL Final   MCH 12/25/2023 28.3  26.0 - 34.0 pg Final   MCHC 12/25/2023 31.9  30.0 - 36.0 g/dL Final   RDW 90/84/7974 14.6  11.5 - 15.5 % Final   Platelets 12/25/2023 243  150 - 400 K/uL Final   nRBC 12/25/2023 0.0  0.0 - 0.2 % Final   Performed at [facility], [address] [place], [place] [address]   Sodium 12/25/2023 139  135 - 145 mmol/L Final   Potassium 12/25/2023 3.5  3.5 - 5.1 mmol/L Final   Chloride 12/25/2023 101  98 - 111 mmol/L Final   CO2 12/25/2023 26  22 - 32 mmol/L Final   Glucose, Bld 12/25/2023 109 (H)  70 - 99 mg/dL Final   Glucose reference range applies only to samples taken after fasting for at least 8 hours.   BUN 12/25/2023 22  8 - 23 mg/dL Final   Creatinine, Ser 12/25/2023 1.32 (H)  0.61 - 1.24 mg/dL Final   Calcium  12/25/2023 8.9  8.9 - 10.3 mg/dL Final   GFR, Estimated 12/25/2023 >60  >60 mL/min Final   Comment: (NOTE) Calculated using the CKD-EPI Creatinine Equation (2021)    Anion gap 12/25/2023 12  5 - 15 Final   Performed at [facility], [address] [place], [place] [address]    Blood Alcohol level:  Lab Results  Component Value Date   [facility] <15 01/18/2024   ETH <10 03/15/2022    Metabolic Disorder Labs: Lab Results  Component Value Date   HGBA1C 5.7 (H) 01/18/2024   MPG 116.89 01/18/2024   MPG 131.24 02/26/2022   No results found for: PROLACTIN Lab Results  Component Value Date   CHOL 92 01/18/2024   TRIG 34 01/18/2024   HDL 46 01/18/2024   CHOLHDL 2.0 01/18/2024   VLDL 7 01/18/2024   LDLCALC 39 01/18/2024   LDLCALC 69 07/31/2021    Therapeutic Lab Levels: No results found for: LITHIUM No results found for: VALPROATE No results found for: CBMZ  Physical Findings   AUDIT    Flowsheet Row ED to Hosp-Admission (Discharged) from 01/13/2022 in [facility] 3E HF PCU  Alcohol Use Disorder  Identification Test Final Score (AUDIT) 3   PHQ2-9    Flowsheet Row ED from 01/18/2024 in  [facility] Most recent reading at 01/20/2024  2:32 PM ED from 01/18/2024 in [facility] Most recent reading at 01/18/2024  2:49 PM  PHQ-2 Total Score 2 6  PHQ-9 Total Score 9 18   Flowsheet Row ED from 01/18/2024 in [facility] Most recent reading at 01/18/2024  2:54 PM ED from 01/18/2024 in [facility] Most recent reading at 01/18/2024 12:01 PM ED from 01/11/2024 in [facility] Most recent reading at 01/11/2024  9:40 PM  C-SSRS RISK CATEGORY No Risk No Risk No Risk     Musculoskeletal  Strength & Muscle Tone: within normal limits Gait & Station: normal Patient leans: N/A  Psychiatric Specialty Exam  Presentation  General Appearance:  Appropriate for Environment  Eye Contact: Good  Speech: Clear and Coherent  Speech Volume: Normal  Handedness: Right   Mood and Affect  Mood: Depressed; Anxious  Affect: Appropriate   Thought Process  Thought Processes: Coherent  Descriptions of Associations:Intact  Orientation:Full (Time, Place and Person)  Thought Content:Logical  Diagnosis of Schizophrenia or Schizoaffective disorder in past: No    Hallucinations:Hallucinations: None  Ideas of Reference:None  Suicidal Thoughts:Suicidal Thoughts: No  Homicidal Thoughts:Homicidal Thoughts: No   Sensorium  Memory: Immediate Fair; Recent Good  Judgment: Good  Insight: Good   Executive Functions  Concentration: Fair  Attention Span: Good  Recall: Good  Fund of Knowledge: Fair  Language: Good   Psychomotor Activity  Psychomotor Activity: Psychomotor Activity: Normal   Assets  Assets: Communication Skills; Desire for Improvement   Sleep  Sleep: Sleep: Good  Estimated Sleeping Duration (Last 24 Hours): 8.25-9.50 hours  Nutritional Assessment (For OBS and FBC admissions only) Has the  patient had a weight loss or gain of 10 pounds or more in the last 3 months?: No Has the patient had a decrease in food intake/or appetite?: No Does the patient have dental problems?: No Does the patient have eating habits or behaviors that may be indicators of an eating disorder including binging or inducing vomiting?: No Has the patient recently lost weight without trying?: 0 Has the patient been eating poorly because of a decreased appetite?: 0 Malnutrition Screening Tool Score: 0    Physical Exam  Physical Exam ROS Blood pressure 119/67, pulse 88, temperature 97.7 F (36.5 C), temperature source Oral, resp. rate 16, SpO2 97%. There is no height or weight on file to calculate BMI.  Treatment Plan Summary: Daily contact with patient to assess and evaluate symptoms and progress in treatment and Medication management  Continue with current treatment plan as listed below on 01/21/2024 except were noted  Schizoaffective disorder: Posttraumatic stress disorder: Major depressive disorder: Generalized anxiety disorder: Substance-induced mood disorder:  Discontinued Zoloft 25 mg daily this patient refused dose on yesterday Restarted Celexa  10 mg daily Continue Abilify  10 mg daily Continue hydroxyzine 25 mg p.o. 3 times daily as needed - CIWA monitoring  Past Medical History: a medical history of CHF, COPD, hypertension, hyperlipidemia, neuropathy, diabetes type 2 and degenerative disc disease   - Continue Lipitor  80 mg daily - Continue albuterol  as needed wheezing shortness of breath - Continue Jardiance  10 mg daily Continue losartan  25 mg daily Continue aspirin  81 mg daily  CSW to continue working on discharge disposition -Of note patient reports he is not interested attending  residential treatment facility to include [facility] treatment center  out of the area) Patient encouraged to participate throughout the milieu    Staci LOISE Kerns, NP 01/21/2024 9:42 AM
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
## 2024-01-21 NOTE — ED Notes (Signed)
 Pt sitting in dayroom watching television and interacting with peers. No acute distress noted. No concerns voiced. Informed pt to notify staff with any needs or assistance. Pt verbalized understanding and agreement. Will continue to monitor for safety.
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
## 2024-01-21 NOTE — ED Notes (Signed)
 Pt sleeping in no acute distress. RR even and unlabored. Environment secured. Will continue to monitor for safety.

## 2024-01-21 NOTE — Progress Notes (Signed)
Meal Given 

## 2024-01-21 NOTE — ED Notes (Signed)
 Pt alet and oriented x 4, pt rates depression 2/10 and anxiety 2/10. Pt reports a good appetite, and no physical problems. Pt denies SI/HI/AVH and verbally contracts for safety. Provided support and encouragement. Pt safe on the unit. Q 15 minute safety checks continued.

## 2024-01-22 MED ORDER — NICOTINE POLACRILEX 2 MG MT GUM
4.0000 mg | CHEWING_GUM | OROMUCOSAL | Status: DC
  Administered 2024-01-22 – 2024-01-23 (×3): 4 mg via ORAL
  Filled 2024-01-22 (×5): qty 2

## 2024-01-22 NOTE — Care Management (Addendum)
 [facility] Care Management  Writer reached out to [facility], patient is currently under review at [facility]  3:15 pm Patient was declined at [facility], does not meet criteria. Patient has been denied at [facility], [facility] needed referral from [place] in-order to receive services.  No other information or contact received from the [place] [facility] or [place])     3:45 pm  Writer spoke with patient and advised of outcome. Patient is scheduled for discharge tomorrow 01/23/2024 by 11 am.  Writer provided patient with out patient resources. Patient will advised of discharge location in the morning.  Based on the information that you have provided and the presenting issues outpatient services and resources for have been recommended.  It is imperative that you follow through with treatment recommendations within 5-7 days from the of discharge to mitigate further risk to your safety and mental well-being. A list of referrals has been provided below to get you started.  You are not limited to the list provided.  In case of an urgent crisis, you may contact the Mobile Crisis Unit with [facility] at [phone].                                                        Substance Abuse Facilities for [employer]  Substance Use Disorder (SUD) Program Learn more about Substance Use Disorder (SUD) NOTE: The email and phone numbers provided for the SUD Programs are for information inquiries and are not continuously monitored. [facility] without a specific SUD Program do offer SUD Treatment. Contact your local [facility] and ask for the Mental Health clinic. Many [facility] and [facility] Clinics also offer SUD treatment. If you need immediate assistance, contact 911 or 1-800-273-TALK/8255.       [facility] [address] [place], [place] [address] Phone: [phone] Or [phone]  SUD Standard Outpatient Judi Swayne Gem, LCAS:  [phone] SUD 24-Hour Care (Residential) Carle Lundborg Detemple [facility] admission coordinator):  [phone] X [phone]  SUD Standard Outpatient Rosina Simpers:  [phone] X [phone]     [facility] [address] [place], [place] [address] Phone: [phone] Or [phone]  SUD 24-Hour Care (Residential) Administrative contact is not available. Call the [facility] listed above. SUD Intensive Outpatient Administrative contact is not available. Call the [facility] listed above. SUD Standard Outpatient Administrative contact is not available. Call the [facility] listed above.     [facility] [address] [place], [place] [address] Phone: [phone]   SUD Intensive Outpatient Starleen Hummer, Substance Use Disorder Intensive Outpatient Program:  [phone] SUD Standard Outpatient Starleen Hummer, Substance Use Disorder Intensive Outpatient Program:  [phone]       [facility] GLENWOOD BANNISTER [phone]) [facility] [address] [place], [place] [address] Phone: [phone] Or [phone]   SUD 24-Hour Care (Residential) Tom Stagg (SATP, IOP, SARRTP, SUD OP,SAS OP, Buprenorphine Outpatient Clinics):  [phone] X [phone] SUD Intensive Outpatient Tom Stagg (SATP, IOP, SARRTP, SUD OP,SAS OP, Buprenorphine Outpatient Clinics):  [phone] SUD Standard Outpatient Tom Stagg (SATP, IOP, SARRTP, SUD OP,SAS OP, Buprenorphine Outpatient Clinics):  [phone]   VA Employees: To update contact information in  the SUD Program Locator, contact [email or web address]

## 2024-01-22 NOTE — Care Management (Addendum)
 [facility] Care Management  Per previous note from 01/19/2024...   Today is a Federal Holiday ([place]) at [facility] [place] requested follow-up call Tuesday 10/12 to inquire of bed availability  Writer will follow-up with [facility] and Software engineer will follow-up with [facility] on Tuesday 01/21/2024  9:11 am  Writer reached out to [facility].  Patient was provided phone number to complete phone intake screening
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
## 2024-01-22 NOTE — ED Notes (Signed)
 Paitent had breakfast.

## 2024-01-22 NOTE — ED Provider Notes (Addendum)
 FBC/OBS ASAP Discharge Summary  Date and Time: 01/23/2024 08:13  Name: Allen Mayer  MRN:  [id number]   Discharge Diagnoses:  Final diagnoses:  Cocaine abuse (HCC)  Adjustment disorder with depressed mood  Alcohol use  Substance abuse (HCC)    Subjective: I am not sure about my discharge plans  Stay Summary: Pt was admitted to [facility] from 01/18/24-01/22/24 and was requesting for substance use treatment.  Pt is a 63 yo male who presented voluntarily and unaccompanied due to worsening depression and request for substance use treatment. Pt stated that he regularly uses alcohol and crack cocaine to "self-medicate" and wants to stop. Pt stated that he was incarcerated for 2 years until August 2025 when he was released. Since then, he has been trying to get his disability income reinstated and fins a place to live. Pt stated that he is disabled due to his mental health and multiple chronic medical condition he has. Pt stated he is currently homeless. Hx of schizoaffective d/o and PTSD from his [employer]. Pt stated that he has medication management through the [facility] in [place]. Pt denied current SI, HI, NSSH, AVH and paranoia. Pt denied any previous inpatient psychiatric admissions  During the patient's hospitalization, patient had initial psychiatric and substance use evaluation, and follow-up psychiatric / substance use evaluations every day.  Psychiatric diagnoses provided upon initial assessment: Cocaine abuse, alcohol use  Patient's psychiatric medications were restarted on admission:  -Continue Abilify  10 mg by mouth daily for mood stabilization and hx of schizoaffective disorder  -Continue Celexa  20 mg daily for depression ( per report pt has been refusing) -A provider added Ativan  1mg  PO PRN Q 6 HRS for Ativan  detox protocol  Patient's medical medications that were restarted on admission:  Continue albuterol  1 to 2 puffs every 6 hours as needed for shortness of  breath/asthma Continue aspirin  81 mg p.o. daily for extensive cardiac history Continue digoxin  0.125 mg p.o. daily for history of CHF Continue losartan  25 mg p.o. daily for history of hypertension Continue Xarelto  20 mg p.o. daily for cardiac history/prevent clots  Continue spironolactone  12.5 mg daily for cardiac history/CHF Continue torsemide  20 mg daily for cardiac history/CHF Continue atorvastatin  80 mg daily for hyperlipidemia Continue Jardiance  10 mg p.o. daily for history of diabetes type 2  Patient's care was discussed with the interdisciplinary team every day during the hospitalization.  Patient was evaluated each day by a clinical provider to ascertain response to treatment. Improvement was noted by the patient's report of decreasing symptoms, improved sleep and appetite, affect, medication tolerance, and behavior.  Symptoms were reported as significantly decreased or resolved completely by discharge.   On day of discharge, the patient reports that their mood is stable. He denied having suicidal and homicidal thoughts.  Patient denies having auditory hallucinations.  Patient denies any visual hallucinations or other symptoms of psychosis. The patient was motivated to continue taking medication with a goal of continued improvement in mental health.   The patient also participated in some group therapy while hospitalized. Coping skills, problem solving as well as relaxation therapies were also part of the unit programming.  Labs were reviewed with the patient, and abnormal results were discussed with the patient.  The patient is able to verbalize their individual safety plan to this provider.  Total Time spent with patient: 45 minutes  Past Psychiatric History: Pt reports schizoaffective d/o.  Per chart review: anxiety, depression, PTSD from [employer]. Substance use hx: Alcohol use disorder, cocaine use disorder.  Past Medical History: Asthma, back pain, CHF, Chronic Kidney  dx, COPD, HTN, neuropathic pain, hyperlipemia, type 2 DM.  Family History:No history reported.  Family Psychiatric History: No history reported.  Social History: Pt is divorced and has no children; is experiencing homelessness. Completed some college classes, discharged from the [employer] in 1981, waiting for approval of his disability, unemployed.  Tobacco Cessation:  A prescription for an FDA-approved tobacco cessation medication provided at discharge  Current Medications:  Current Facility-Administered Medications  Medication Dose Route Frequency Provider Last Rate Last Admin   acetaminophen  (TYLENOL ) tablet 650 mg  650 mg Oral Q6H PRN White, Patrice L, NP       albuterol  (VENTOLIN  HFA) 108 (90 Base) MCG/ACT inhaler 1-2 puff  1-2 puff Inhalation Q6H PRN White, Patrice L, NP   1 puff at 01/21/24 0914   alum & mag hydroxide-simeth (MAALOX/MYLANTA) 200-200-20 MG/5ML suspension 30 mL  30 mL Oral Q4H PRN White, Patrice L, NP       ARIPiprazole  (ABILIFY ) tablet 10 mg  10 mg Oral Daily White, Patrice L, NP   10 mg at 01/21/24 0909   aspirin  EC tablet 81 mg  81 mg Oral Daily White, Patrice L, NP   81 mg at 01/22/24 0859   atorvastatin  (LIPITOR ) tablet 80 mg  80 mg Oral QPM White, Patrice L, NP   80 mg at 01/21/24 1816   citalopram  (CELEXA ) tablet 10 mg  10 mg Oral Daily Ezzard Staci SAILOR, NP   10 mg at 01/21/24 1341   digoxin  (LANOXIN ) tablet 0.125 mg  0.125 mg Oral Daily White, Patrice L, NP   0.125 mg at 01/22/24 0859   empagliflozin  (JARDIANCE ) tablet 10 mg  10 mg Oral Daily White, Patrice L, NP   10 mg at 01/22/24 0900   fluticasone  furoate-vilanterol (BREO ELLIPTA) 100-25 MCG/ACT 1 puff  1 puff Inhalation Daily Tex Drilling, NP   1 puff at 01/20/24 1530   gabapentin  (NEURONTIN ) capsule 200 mg  200 mg Oral BID Tex Drilling, NP   200 mg at 01/22/24 0900   losartan  (COZAAR ) tablet 25 mg  25 mg Oral Daily White, Patrice L, NP   25 mg at 01/22/24 0900   magnesium  hydroxide (MILK OF MAGNESIA)  suspension 30 mL  30 mL Oral Daily PRN White, Patrice L, NP       multivitamin with minerals tablet 1 tablet  1 tablet Oral Daily White, Patrice L, NP   1 tablet at 01/18/24 1600   OLANZapine (ZYPREXA) injection 5 mg  5 mg Intramuscular TID PRN White, Patrice L, NP       OLANZapine zydis (ZYPREXA) disintegrating tablet 5 mg  5 mg Oral TID PRN White, Patrice L, NP       rivaroxaban  (XARELTO ) tablet 20 mg  20 mg Oral Q supper White, Patrice L, NP   20 mg at 01/21/24 1800   spironolactone  (ALDACTONE ) tablet 12.5 mg  12.5 mg Oral Daily White, Patrice L, NP   12.5 mg at 01/22/24 0859   thiamine (VITAMIN B1) tablet 100 mg  100 mg Oral Daily White, Patrice L, NP   100 mg at 01/22/24 9140   torsemide  (DEMADEX ) tablet 20 mg  20 mg Oral Daily White, Patrice L, NP   20 mg at 01/18/24 1600   Current Outpatient Medications  Medication Sig Dispense Refill   albuterol  (VENTOLIN  HFA) 108 (90 Base) MCG/ACT inhaler Inhale 1-2 puffs into the lungs every 6 (six) hours as needed for wheezing or shortness  of breath. 18 g 0   ARIPiprazole  (ABILIFY ) 10 MG tablet Take 1 tablet (10 mg total) by mouth daily. 90 tablet 0   aspirin  EC 81 MG tablet Take 1 tablet (81 mg total) by mouth daily. Swallow whole. 90 tablet 1   atorvastatin  (LIPITOR ) 80 MG tablet Take 1 tablet (80 mg total) by mouth daily. 90 tablet 1   cholecalciferol  (VITAMIN D3) 25 MCG (1000 UNIT) tablet Take 5 tablets (5,000 Units total) by mouth daily. 150 tablet 0   citalopram  (CELEXA ) 20 MG tablet Take 1 tablet (20 mg total) by mouth daily. 90 tablet 0   digoxin  (LANOXIN ) 0.125 MG tablet Take 1 tablet (0.125 mg total) by mouth daily. 30 tablet 1   empagliflozin  (JARDIANCE ) 10 MG TABS tablet Take 1 tablet (10 mg total) by mouth daily. 90 tablet 1   gabapentin  (NEURONTIN ) 400 MG capsule Take 1 capsule (400 mg total) by mouth daily as needed (nerve pain). (Patient taking differently: Take 400 mg by mouth 2 (two) times daily as needed (nerve pain).) 90 capsule 0    losartan  (COZAAR ) 25 MG tablet Take 1 tablet (25 mg total) by mouth daily. 90 tablet 1   meloxicam  (MOBIC ) 7.5 MG tablet Take 1 tablet (7.5 mg total) by mouth daily. (Patient not taking: No sig reported) 5 tablet 0   mometasone -formoterol  (DULERA ) 200-5 MCG/ACT AERO Inhale 2 puffs into the lungs 2 (two) times daily.     rivaroxaban  (XARELTO ) 20 MG TABS tablet Take 1 tablet (20 mg total) by mouth daily with supper. 60 tablet 0   spironolactone  (ALDACTONE ) 25 MG tablet Take 1 tablet (25 mg total) by mouth daily. 90 tablet 1   torsemide  (DEMADEX ) 20 MG tablet Take 1 tablet (20 mg total) by mouth daily. 90 tablet 1   umeclidinium bromide  (INCRUSE ELLIPTA ) 62.5 MCG/ACT AEPB Inhale 1 puff into the lungs daily.      PTA Medications:  Facility Ordered Medications  Medication   acetaminophen  (TYLENOL ) tablet 650 mg   alum & mag hydroxide-simeth (MAALOX/MYLANTA) 200-200-20 MG/5ML suspension 30 mL   magnesium  hydroxide (MILK OF MAGNESIA) suspension 30 mL   OLANZapine zydis (ZYPREXA) disintegrating tablet 5 mg   OLANZapine (ZYPREXA) injection 5 mg   albuterol  (VENTOLIN  HFA) 108 (90 Base) MCG/ACT inhaler 1-2 puff   ARIPiprazole  (ABILIFY ) tablet 10 mg   aspirin  EC tablet 81 mg   atorvastatin  (LIPITOR ) tablet 80 mg   digoxin  (LANOXIN ) tablet 0.125 mg   empagliflozin  (JARDIANCE ) tablet 10 mg   losartan  (COZAAR ) tablet 25 mg   rivaroxaban  (XARELTO ) tablet 20 mg   spironolactone  (ALDACTONE ) tablet 12.5 mg   torsemide  (DEMADEX ) tablet 20 mg   thiamine (VITAMIN B1) tablet 100 mg   multivitamin with minerals tablet 1 tablet   [EXPIRED] LORazepam  (ATIVAN ) tablet 1 mg   [EXPIRED] hydrOXYzine (ATARAX) tablet 25 mg   [EXPIRED] loperamide (IMODIUM) capsule 2-4 mg   [EXPIRED] ondansetron  (ZOFRAN -ODT) disintegrating tablet 4 mg   fluticasone  furoate-vilanterol (BREO ELLIPTA) 100-25 MCG/ACT 1 puff   gabapentin  (NEURONTIN ) capsule 200 mg   citalopram  (CELEXA ) tablet 10 mg   PTA Medications  Medication Sig    ARIPiprazole  (ABILIFY ) 10 MG tablet Take 1 tablet (10 mg total) by mouth daily.   citalopram  (CELEXA ) 20 MG tablet Take 1 tablet (20 mg total) by mouth daily.   gabapentin  (NEURONTIN ) 400 MG capsule Take 1 capsule (400 mg total) by mouth daily as needed (nerve pain). (Patient taking differently: Take 400 mg by mouth 2 (two) times  daily as needed (nerve pain).)   albuterol  (VENTOLIN  HFA) 108 (90 Base) MCG/ACT inhaler Inhale 1-2 puffs into the lungs every 6 (six) hours as needed for wheezing or shortness of breath.   rivaroxaban  (XARELTO ) 20 MG TABS tablet Take 1 tablet (20 mg total) by mouth daily with supper.   cholecalciferol  (VITAMIN D3) 25 MCG (1000 UNIT) tablet Take 5 tablets (5,000 Units total) by mouth daily.   meloxicam  (MOBIC ) 7.5 MG tablet Take 1 tablet (7.5 mg total) by mouth daily. (Patient not taking: No sig reported)   torsemide  (DEMADEX ) 20 MG tablet Take 1 tablet (20 mg total) by mouth daily.   spironolactone  (ALDACTONE ) 25 MG tablet Take 1 tablet (25 mg total) by mouth daily.   losartan  (COZAAR ) 25 MG tablet Take 1 tablet (25 mg total) by mouth daily.   empagliflozin  (JARDIANCE ) 10 MG TABS tablet Take 1 tablet (10 mg total) by mouth daily.   digoxin  (LANOXIN ) 0.125 MG tablet Take 1 tablet (0.125 mg total) by mouth daily.   atorvastatin  (LIPITOR ) 80 MG tablet Take 1 tablet (80 mg total) by mouth daily.   aspirin  EC 81 MG tablet Take 1 tablet (81 mg total) by mouth daily. Swallow whole.       01/22/2024   12:30 PM 01/21/2024   11:42 AM 01/20/2024    2:32 PM  Depression screen PHQ 2/9  Decreased Interest 0 0 1  Down, Depressed, Hopeless 1 0 1  PHQ - 2 Score 1 0 2  Altered sleeping 0 0 1  Tired, decreased energy 0 0 1  Change in appetite 0 0 1  Feeling bad or failure about yourself  0 0 1  Trouble concentrating 0 0 1  Moving slowly or fidgety/restless 0 0 1  Suicidal thoughts 0 0 1  PHQ-9 Score 1 0 9  Difficult doing work/chores  Not difficult at all Somewhat difficult     Flowsheet Row ED from 01/18/2024 in [facility] Most recent reading at 01/18/2024  2:54 PM ED from 01/18/2024 in [facility] Most recent reading at 01/18/2024 12:01 PM ED from 01/11/2024 in [facility] Emergency Department at [facility] Most recent reading at 01/11/2024  9:40 PM  C-SSRS RISK CATEGORY No Risk No Risk No Risk    Musculoskeletal  Strength & Muscle Tone: within normal limits Gait & Station: normal Patient leans: N/A  Psychiatric Specialty Exam  Presentation  General Appearance:  Appropriate for Environment  Eye Contact: Good  Speech: Clear and Coherent; Normal Rate  Speech Volume: Normal  Handedness: Right   Mood and Affect  Mood: Anxious  Affect: Appropriate   Thought Process  Thought Processes: Coherent; Goal Directed  Descriptions of Associations:Intact  Orientation:Full (Time, Place and Person)  Thought Content:WDL  Diagnosis of Schizophrenia or Schizoaffective disorder in past: No    Hallucinations:Hallucinations: None  Ideas of Reference:None  Suicidal Thoughts:Suicidal Thoughts: No  Homicidal Thoughts:Homicidal Thoughts: No   Sensorium  Memory: Immediate Fair; Recent Fair  Judgment: Fair  Insight: Good   Executive Functions  Concentration: Fair  Attention Span: Fair  Recall: Good  Fund of Knowledge: Fair  Language: Good   Psychomotor Activity  Psychomotor Activity: Psychomotor Activity: Normal   Assets  Assets: Communication Skills; Desire for Improvement   Sleep  Sleep: Sleep: Good  Estimated Sleeping Duration (Last 24 Hours): 11.25-11.75 hours  Nutritional Assessment (For OBS and FBC admissions only) Has the patient had a weight loss or gain of 10 pounds  or more in the last 3 months?: No Has the patient had a decrease in food intake/or appetite?: No Does the patient have dental problems?: No Does the patient have eating  habits or behaviors that may be indicators of an eating disorder including binging or inducing vomiting?: No Has the patient recently lost weight without trying?: 0 Has the patient been eating poorly because of a decreased appetite?: 0 Malnutrition Screening Tool Score: 0    Physical Exam  Physical Exam Vitals reviewed.  Constitutional:      Appearance: Normal appearance.  HENT:     Head: Normocephalic and atraumatic.     Nose: Nose normal.     Mouth/Throat:     Mouth: Mucous membranes are moist.  Cardiovascular:     Rate and Rhythm: Normal rate.  Pulmonary:     Effort: Pulmonary effort is normal.  Musculoskeletal:        General: Normal range of motion.     Cervical back: Normal range of motion.  Skin:    General: Skin is dry.  Neurological:     Mental Status: He is alert and oriented to person, place, and time.  Psychiatric:        Attention and Perception: Attention and perception normal.        Mood and Affect: Mood is anxious. Affect is angry.        Speech: Speech normal.        Behavior: Behavior is agitated. Behavior is cooperative.        Thought Content: Thought content normal.        Cognition and Memory: Cognition and memory normal.        Judgment: Judgment normal.    Review of Systems  Psychiatric/Behavioral:  Positive for substance abuse. The patient is nervous/anxious.   All other systems reviewed and are negative.  Blood pressure 135/80, pulse 65, temperature 97.6 F (36.4 C), temperature source Oral, resp. rate 17, SpO2 100%. There is no height or weight on file to calculate BMI.  Demographic Factors:  Male, Divorced or widowed, Low socioeconomic status, and Unemployed  Loss Factors: Decline in physical health and Financial problems/change in socioeconomic status  Historical Factors: NA  Risk Reduction Factors:   Positive therapeutic relationship and Positive coping skills or problem solving skills  Continued Clinical Symptoms:   Alcohol/Substance Abuse/Dependencies Chronic Pain Previous Psychiatric Diagnoses and Treatments Medical Diagnoses and Treatments/Surgeries  Cognitive Features That Contribute To Risk:  None    Suicide Risk:  Minimal: No identifiable suicidal ideation.  Patients presenting with no risk factors but with morbid ruminations; may be classified as minimal risk based on the severity of the depressive symptoms  Plan Of Care/Follow-up recommendations:  Pt informed day RN that he was interested in discharging today.  Pt was also provided with outpatient resources and has a follow-up appointment with his current providers.   It is recommended to the patient to continue psychiatric medications as prescribed, after discharge from the hospital.    It is recommended to the patient to follow up with his outpatient psychiatric provider and Primary care providers for the management of his medical diagnoses.  It was discussed with the patient, the impact of alcohol, drugs, tobacco have been there overall psychiatric and medical wellbeing, and total abstinence from substance use was recommended the patient  Disposition: Case manager is following up with [facility] and [facility]; Both rehabilitation facilities have declined pt. Pt is now discharging home to his significant other.   Sample medications  ordered/script provided.   Get help right away if: You have thoughts about hurting yourself or others. Get help right away if you feel like you may hurt yourself or others, or have thoughts about taking your own life. Go to your nearest emergency room or: Call 911. Call the National Suicide Prevention Lifeline at 801-596-4090 or 988 in the [place] This is open 24 hours a day. If you're a Veteran: Call 988 and press 1. This is open 24 hours a day. Text the [employer] at 425-444-2681. Summary Mental health is not just the absence of mental illness. It involves understanding your emotions and behaviors, and taking  steps to manage them in a healthy way. If you have symptoms of mental or emotional distress, get help from family, friends, a health care provider, or a mental health professional. Practice good mental health behaviors such as stress management skills, self-calming skills, exercise, healthy sleeping and eating, and supportive relationships. This information is not intended to replace advice given to you by your health care provider. Make sure you discuss any questions you have with your health care provider.  Education provided on the fact that if experiencing worsening of psychiatry symptoms including suicidal ideations, homicidal ideations, or having auditory/visual hallucinations, etc, to call 911, 988, come back to this location, or go to the nearest ER. Pt verbalized understanding.       Tosin Marjory Meints, NP 01/23/2024, 08:13
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
## 2024-01-22 NOTE — ED Provider Notes (Signed)
 Behavioral Health Progress Note  Date and Time: 01/22/2024 1:55 PM Name: Allen Mayer MRN:  [id number]  Subjective:  I am not sure about my discharge plans   Allen Mayer, is seen face to face by this provider, consulted with Dr. Lawrnce; and chart reviewed on 01/22/24.  On evaluation Allen Mayer reports being unsure of his discharge plans. He describes his mood as stable and denies suicidal or homicidal ideation. He denies auditory or visual hallucinations. He stated that the last time he experienced auditory hallucinations was last week.  He reports having back problems and several medical diagnoses, which is why he is unsure of his discharge plans because he has appointments to attend.  He also shared that his cardiologist contacted him at home, and he is trying to determine the reason for the call.  Per report, his cardiologist requested that he come in for an appointment next Monday. He reports he is currently unemployed and is working on obtaining disability benefits. He denies any withdrawal symptoms and reports that his medications are working well.   During evaluation Allen Mayer is sitting on his bed, in an upright position in no acute distress. He is alert & oriented x 4, some periods he was calm, cooperative and attentive for this assessment at other times he was irritable.  His mood is anxious with congruent affect.  He has normal speech, and behavior.  Objectively there is no evidence of psychosis/mania or delusional thinking. Pt does not appear to be responding to internal or external stimuli.  Patient is able to converse coherently, goal directed thoughts, no distractibility, or pre-occupation.  He also denies suicidal/self-harm/homicidal ideation, psychosis, and paranoia.   Diagnosis:  Final diagnoses:  Cocaine abuse (HCC)  Adjustment disorder with depressed mood  Alcohol use  Substance abuse (HCC)    Total Time spent with patient: 30 minutes  Past  Psychiatric History: Pt reports schizoaffective d/o. Per chart review: anxiety, depression, PTSD from [employer]. Substance use hx: Alcohol use disorder, cocaine use disorder.  Past Medical History: Asthma, back pain, CHF, Chronic Kidney dx, COPD, HTN, neuropathic pain, hyperlipemia, type 2 DM.  Family History: No history reported.  Family Psychiatric  History: No history reported.  Social History: Pt is divorced and has no children; is experiencing homelessness. Completed some college classes, discharged from the [employer] in 1981, waiting for approval of his disability, unemployed.   Additional Social History: N/A                        Sleep: Good  Appetite:  Good  Current Medications:  Current Facility-Administered Medications  Medication Dose Route Frequency Provider Last Rate Last Admin   acetaminophen  (TYLENOL ) tablet 650 mg  650 mg Oral Q6H PRN White, Patrice L, NP       albuterol  (VENTOLIN  HFA) 108 (90 Base) MCG/ACT inhaler 1-2 puff  1-2 puff Inhalation Q6H PRN White, Patrice L, NP   1 puff at 01/21/24 0914   alum & mag hydroxide-simeth (MAALOX/MYLANTA) 200-200-20 MG/5ML suspension 30 mL  30 mL Oral Q4H PRN White, Patrice L, NP       ARIPiprazole  (ABILIFY ) tablet 10 mg  10 mg Oral Daily White, Patrice L, NP   10 mg at 01/21/24 9090   aspirin  EC tablet 81 mg  81 mg Oral Daily White, Patrice L, NP   81 mg at 01/22/24 0859   atorvastatin  (LIPITOR ) tablet 80 mg  80 mg Oral QPM White, Patrice L, NP  80 mg at 01/21/24 1816   citalopram  (CELEXA ) tablet 10 mg  10 mg Oral Daily Ezzard Staci SAILOR, NP   10 mg at 01/21/24 1341   digoxin  (LANOXIN ) tablet 0.125 mg  0.125 mg Oral Daily White, Patrice L, NP   0.125 mg at 01/22/24 0859   empagliflozin  (JARDIANCE ) tablet 10 mg  10 mg Oral Daily White, Patrice L, NP   10 mg at 01/22/24 0900   fluticasone  furoate-vilanterol (BREO ELLIPTA) 100-25 MCG/ACT 1 puff  1 puff Inhalation Daily Tex Drilling, NP   1 puff at 01/20/24 1530    gabapentin  (NEURONTIN ) capsule 200 mg  200 mg Oral BID Tex Drilling, NP   200 mg at 01/22/24 0900   losartan  (COZAAR ) tablet 25 mg  25 mg Oral Daily White, Patrice L, NP   25 mg at 01/22/24 0900   magnesium  hydroxide (MILK OF MAGNESIA) suspension 30 mL  30 mL Oral Daily PRN White, Patrice L, NP       multivitamin with minerals tablet 1 tablet  1 tablet Oral Daily White, Patrice L, NP   1 tablet at 01/18/24 1600   OLANZapine (ZYPREXA) injection 5 mg  5 mg Intramuscular TID PRN White, Patrice L, NP       OLANZapine zydis (ZYPREXA) disintegrating tablet 5 mg  5 mg Oral TID PRN White, Patrice L, NP       rivaroxaban  (XARELTO ) tablet 20 mg  20 mg Oral Q supper White, Patrice L, NP   20 mg at 01/21/24 1800   spironolactone  (ALDACTONE ) tablet 12.5 mg  12.5 mg Oral Daily White, Patrice L, NP   12.5 mg at 01/22/24 0859   thiamine (VITAMIN B1) tablet 100 mg  100 mg Oral Daily White, Patrice L, NP   100 mg at 01/22/24 9140   torsemide  (DEMADEX ) tablet 20 mg  20 mg Oral Daily White, Patrice L, NP   20 mg at 01/18/24 1600   Current Outpatient Medications  Medication Sig Dispense Refill   albuterol  (VENTOLIN  HFA) 108 (90 Base) MCG/ACT inhaler Inhale 1-2 puffs into the lungs every 6 (six) hours as needed for wheezing or shortness of breath. 18 g 0   ARIPiprazole  (ABILIFY ) 10 MG tablet Take 1 tablet (10 mg total) by mouth daily. 90 tablet 0   aspirin  EC 81 MG tablet Take 1 tablet (81 mg total) by mouth daily. Swallow whole. 90 tablet 1   atorvastatin  (LIPITOR ) 80 MG tablet Take 1 tablet (80 mg total) by mouth daily. 90 tablet 1   cholecalciferol  (VITAMIN D3) 25 MCG (1000 UNIT) tablet Take 5 tablets (5,000 Units total) by mouth daily. 150 tablet 0   citalopram  (CELEXA ) 20 MG tablet Take 1 tablet (20 mg total) by mouth daily. 90 tablet 0   digoxin  (LANOXIN ) 0.125 MG tablet Take 1 tablet (0.125 mg total) by mouth daily. 30 tablet 1   empagliflozin  (JARDIANCE ) 10 MG TABS tablet Take 1 tablet (10 mg total) by mouth  daily. 90 tablet 1   gabapentin  (NEURONTIN ) 400 MG capsule Take 1 capsule (400 mg total) by mouth daily as needed (nerve pain). (Patient taking differently: Take 400 mg by mouth 2 (two) times daily as needed (nerve pain).) 90 capsule 0   losartan  (COZAAR ) 25 MG tablet Take 1 tablet (25 mg total) by mouth daily. 90 tablet 1   meloxicam  (MOBIC ) 7.5 MG tablet Take 1 tablet (7.5 mg total) by mouth daily. (Patient not taking: No sig reported) 5 tablet 0   mometasone -formoterol  (DULERA ) 200-5  MCG/ACT AERO Inhale 2 puffs into the lungs 2 (two) times daily.     rivaroxaban  (XARELTO ) 20 MG TABS tablet Take 1 tablet (20 mg total) by mouth daily with supper. 60 tablet 0   spironolactone  (ALDACTONE ) 25 MG tablet Take 1 tablet (25 mg total) by mouth daily. 90 tablet 1   torsemide  (DEMADEX ) 20 MG tablet Take 1 tablet (20 mg total) by mouth daily. 90 tablet 1   umeclidinium bromide  (INCRUSE ELLIPTA ) 62.5 MCG/ACT AEPB Inhale 1 puff into the lungs daily.      Labs  Lab Results:  Admission on 01/18/2024  Component Date Value Ref Range Status   Vit D, 25-Hydroxy 01/20/2024 38.23  30 - 100 ng/mL Final   Comment: (NOTE) Vitamin D deficiency has been defined by the Institute of Medicine  and an Endocrine Society practice guideline as a level of serum 25-OH  vitamin D less than 20 ng/mL (1,2). The Endocrine Society went on to  further define vitamin D insufficiency as a level between 21 and 29  ng/mL (2).  1. IOM (Institute of Medicine). 2010. Dietary reference intakes for  calcium  and D. [place]: [employer]. 2. Holick MF, Binkley [place], Bischoff-Ferrari HA, et al. Evaluation,  treatment, and prevention of vitamin D deficiency: an Endocrine  Society clinical practice guideline, JCEM. 2011 Jul; 96(7): 1911-30.  Performed at [facility] Lab, [address], [place], [place] [address]    Vitamin B-12 01/20/2024 366  180 - 914 pg/mL Final   Comment: (NOTE) This assay is not validated  for testing neonatal or myeloproliferative syndrome specimens for Vitamin B12 levels. Performed at [facility], [address], [place], [place] [address]   Admission on 01/18/2024, Discharged on 01/18/2024  Component Date Value Ref Range Status   WBC 01/18/2024 4.7  4.0 - 10.5 K/uL Final   RBC 01/18/2024 5.64  4.22 - 5.81 MIL/uL Final   Hemoglobin 01/18/2024 15.8  13.0 - 17.0 g/dL Final   HCT 89/90/7974 50.3  39.0 - 52.0 % Final   MCV 01/18/2024 89.2  80.0 - 100.0 fL Final   MCH 01/18/2024 28.0  26.0 - 34.0 pg Final   MCHC 01/18/2024 31.4  30.0 - 36.0 g/dL Final   RDW 89/90/7974 14.8  11.5 - 15.5 % Final   Platelets 01/18/2024 223  150 - 400 K/uL Final   nRBC 01/18/2024 0.0  0.0 - 0.2 % Final   Neutrophils Relative % 01/18/2024 57  % Final   Neutro Abs 01/18/2024 2.7  1.7 - 7.7 K/uL Final   Lymphocytes Relative 01/18/2024 32  % Final   Lymphs Abs 01/18/2024 1.5  0.7 - 4.0 K/uL Final   Monocytes Relative 01/18/2024 6  % Final   Monocytes Absolute 01/18/2024 0.3  0.1 - 1.0 K/uL Final   Eosinophils Relative 01/18/2024 4  % Final   Eosinophils Absolute 01/18/2024 0.2  0.0 - 0.5 K/uL Final   Basophils Relative 01/18/2024 1  % Final   Basophils Absolute 01/18/2024 0.0  0.0 - 0.1 K/uL Final   Immature Granulocytes 01/18/2024 0  % Final   Abs Immature Granulocytes 01/18/2024 0.01  0.00 - 0.07 K/uL Final   Performed at [facility] Lab, [address], [place], [place] [address]   Sodium 01/18/2024 139  135 - 145 mmol/L Final   Potassium 01/18/2024 3.6  3.5 - 5.1 mmol/L Final   Chloride 01/18/2024 99  98 - 111 mmol/L Final   CO2 01/18/2024 29  22 - 32 mmol/L Final   Glucose, Bld 01/18/2024 120 (H)  70 - 99 mg/dL Final   Glucose reference range applies only to samples taken after fasting for at least 8 hours.   BUN 01/18/2024 15  8 - 23 mg/dL Final   Creatinine, Ser 01/18/2024 0.95  0.61 - 1.24 mg/dL Final   Calcium  01/18/2024 9.5  8.9 - 10.3 mg/dL Final   Total Protein  01/18/2024 8.3 (H)  6.5 - 8.1 g/dL Final   Albumin 89/90/7974 4.0  3.5 - 5.0 g/dL Final   AST 89/90/7974 62 (H)  15 - 41 U/L Final   ALT 01/18/2024 46 (H)  0 - 44 U/L Final   Alkaline Phosphatase 01/18/2024 65  38 - 126 U/L Final   Total Bilirubin 01/18/2024 1.2  0.0 - 1.2 mg/dL Final   GFR, Estimated 01/18/2024 >60  >60 mL/min Final   Comment: (NOTE) Calculated using the CKD-EPI Creatinine Equation (2021)    Anion gap 01/18/2024 11  5 - 15 Final   Performed at [facility], [address] [place], [place] [address]   Hgb A1c MFr Bld 01/18/2024 5.7 (H)  4.8 - 5.6 % Final   Comment: (NOTE) Diagnosis of Diabetes The following HbA1c ranges recommended by the American Diabetes Association (ADA) may be used as an aid in the diagnosis of diabetes mellitus.  Hemoglobin             Suggested A1C NGSP%              Diagnosis  <5.7                   Non Diabetic  5.7-6.4                Pre-Diabetic  >6.4                   Diabetic  <7.0                   Glycemic control for                       adults with diabetes.     Mean Plasma Glucose 01/18/2024 116.89  mg/dL Final   Performed at [facility], [address] [place], [place] [address]   Alcohol, Ethyl (B) 01/18/2024 <15  <15 mg/dL Final   Comment: (NOTE) For medical purposes only. Performed at [facility], [address] [place], [place] [address]    Cholesterol 01/18/2024 92  0 - 200 mg/dL Final   Triglycerides 89/90/7974 34  <150 mg/dL Final   HDL 89/90/7974 46  >40 mg/dL Final   Total CHOL/HDL Ratio 01/18/2024 2.0  RATIO Final   VLDL 01/18/2024 7  0 - 40 mg/dL Final   LDL Cholesterol 01/18/2024 39  0 - 99 mg/dL Final   Comment:        Total Cholesterol/HDL:CHD Risk Coronary Heart Disease Risk Table                     Men   Women  1/2 Average Risk   3.4   3.3  Average Risk       5.0   4.4  2 X Average Risk   9.6   7.1  3 X Average Risk  23.4   11.0        Use the calculated Patient  Ratio above and the CHD  Risk Table to determine the patient's CHD Risk.        ATP III CLASSIFICATION (LDL):  <100     mg/dL   Optimal  899-870  mg/dL   Near or Above                    Optimal  130-159  mg/dL   Borderline  839-810  mg/dL   High  >809     mg/dL   Very High Performed at [facility], [address] [place], [place] [address]    TSH 01/18/2024 0.814  0.350 - 4.500 uIU/mL Final   Comment: Performed by a 3rd Generation assay with a functional sensitivity of <=0.01 uIU/mL. Performed at [facility], [address] [place], [place] [address]    POC Amphetamine UR 01/18/2024 None Detected  NONE DETECTED (Cut Off Level 1000 ng/mL) Final   POC Secobarbital (BAR) 01/18/2024 None Detected  NONE DETECTED (Cut Off Level 300 ng/mL) Final   POC Buprenorphine (BUP) 01/18/2024 None Detected  NONE DETECTED (Cut Off Level 10 ng/mL) Final   POC Oxazepam (BZO) 01/18/2024 None Detected  NONE DETECTED (Cut Off Level 300 ng/mL) Final   POC Cocaine UR 01/18/2024 Positive (A)  NONE DETECTED (Cut Off Level 300 ng/mL) Final   POC Methamphetamine UR 01/18/2024 None Detected  NONE DETECTED (Cut Off Level 1000 ng/mL) Final   POC Morphine  01/18/2024 None Detected  NONE DETECTED (Cut Off Level 300 ng/mL) Final   POC Methadone UR 01/18/2024 None Detected  NONE DETECTED (Cut Off Level 300 ng/mL) Final   POC Oxycodone  UR 01/18/2024 None Detected  NONE DETECTED (Cut Off Level 100 ng/mL) Final   POC Marijuana UR 01/18/2024 None Detected  NONE DETECTED (Cut Off Level 50 ng/mL) Final  Admission on 01/11/2024, Discharged on 01/12/2024  Component Date Value Ref Range Status   Sodium 01/11/2024 141  135 - 145 mmol/L Final   Potassium 01/11/2024 4.2  3.5 - 5.1 mmol/L Final   Chloride 01/11/2024 104  98 - 111 mmol/L Final   CO2 01/11/2024 23  22 - 32 mmol/L Final   Glucose, Bld 01/11/2024 125 (H)  70 - 99 mg/dL Final   Glucose reference range applies only to samples taken after fasting for at  least 8 hours.   BUN 01/11/2024 20  8 - 23 mg/dL Final   Creatinine, Ser 01/11/2024 1.26 (H)  0.61 - 1.24 mg/dL Final   Calcium  01/11/2024 9.1  8.9 - 10.3 mg/dL Final   GFR, Estimated 01/11/2024 >60  >60 mL/min Final   Comment: (NOTE) Calculated using the CKD-EPI Creatinine Equation (2021)    Anion gap 01/11/2024 14  5 - 15 Final   Performed at [facility], [address] [place], [place] [address]   WBC 01/11/2024 5.8  4.0 - 10.5 K/uL Final   RBC 01/11/2024 5.28  4.22 - 5.81 MIL/uL Final   Hemoglobin 01/11/2024 15.0  13.0 - 17.0 g/dL Final   HCT 89/97/7974 47.2  39.0 - 52.0 % Final   MCV 01/11/2024 89.4  80.0 - 100.0 fL Final   MCH 01/11/2024 28.4  26.0 - 34.0 pg Final   MCHC 01/11/2024 31.8  30.0 - 36.0 g/dL Final   RDW 89/97/7974 14.8  11.5 - 15.5 % Final   Platelets 01/11/2024 237  150 - 400 K/uL Final   nRBC 01/11/2024 0.0  0.0 - 0.2 % Final   Performed at [facility], [address] [place], [place] [address]   Troponin I (High Sensitivity) 01/11/2024 23 (H)  <18 ng/L Final   Comment: (NOTE) Elevated high sensitivity troponin I (hsTnI) values and significant  changes across serial measurements may suggest ACS but many other  chronic and acute conditions are known to elevate hsTnI results.  Refer to the Links section for chest pain algorithms and additional  guidance. Performed at [facility], [address] [place], [place] [address]    Troponin I (High Sensitivity) 01/11/2024 19 (H)  <18 ng/L Final   Comment: (NOTE) Elevated high sensitivity troponin I (hsTnI) values and significant  changes across serial measurements may suggest ACS but many other  chronic and acute conditions are known to elevate hsTnI results.  Refer to the Links section for chest pain algorithms and additional  guidance. Performed at [facility], [address] [place], [place] [address]   Hospital Outpatient Visit on 01/08/2024  Component Date Value Ref Range  Status   Sodium 01/08/2024 137  135 - 145 mmol/L Final   Potassium 01/08/2024 4.6  3.5 - 5.1 mmol/L Final   Chloride 01/08/2024 101  98 - 111 mmol/L Final   CO2 01/08/2024 27  22 - 32 mmol/L Final   Glucose, Bld 01/08/2024 90  70 - 99 mg/dL Final   Glucose reference range applies only to samples taken after fasting for at least 8 hours.   BUN 01/08/2024 13  8 - 23 mg/dL Final   Creatinine, Ser 01/08/2024 0.94  0.61 - 1.24 mg/dL Final   Calcium  01/08/2024 9.9  8.9 - 10.3 mg/dL Final   GFR, Estimated 01/08/2024 >60  >60 mL/min Final   Comment: (NOTE) Calculated using the CKD-EPI Creatinine Equation (2021)    Anion gap 01/08/2024 9  5 - 15 Final   Performed at [facility] Lab, [address] [place], [place] [address]   B Natriuretic Peptide 01/08/2024 27.6  0.0 - 100.0 pg/mL Final   Performed at [facility], [address] [place], [place] [address]  Admission on 12/29/2023, Discharged on 12/30/2023  Component Date Value Ref Range Status   SARS Coronavirus 2 by RT PCR 12/30/2023 NEGATIVE  NEGATIVE Final   Influenza A by PCR 12/30/2023 NEGATIVE  NEGATIVE Final   Influenza B by PCR 12/30/2023 NEGATIVE  NEGATIVE Final   Comment: (NOTE) The Xpert Xpress SARS-CoV-2/FLU/RSV plus assay is intended as an aid in the diagnosis of influenza from Nasopharyngeal swab specimens and should not be used as a sole basis for treatment. Nasal washings and aspirates are unacceptable for Xpert Xpress SARS-CoV-2/FLU/RSV testing.  Fact Sheet for Patients: BloggerCourse.com  Fact Sheet for Healthcare Providers: SeriousBroker.it  This test is not yet approved or cleared by the [place]  FDA and has been authorized for detection and/or diagnosis of SARS-CoV-2 by FDA under an Emergency Use Authorization (EUA). This EUA will remain in effect (meaning this test can be used) for the duration of the COVID-19 declaration under Section 564(b)(1) of  the Act, 21 U.S.C. section 360bbb-3(b)(1), unless the authorization is terminated or revoked.     Resp Syncytial Virus by PCR 12/30/2023 NEGATIVE  NEGATIVE Final   Comment: (NOTE) Fact Sheet for Patients: BloggerCourse.com  Fact Sheet for Healthcare Providers: SeriousBroker.it  This test is not yet approved or cleared by the [place]  FDA and has been authorized for detection and/or diagnosis of SARS-CoV-2 by FDA under an Emergency Use Authorization (EUA). This EUA will remain in effect (meaning this  test can be used) for the duration of the COVID-19 declaration under Section 564(b)(1) of the Act, 21 U.S.C. section 360bbb-3(b)(1), unless the authorization is terminated or revoked.  Performed at [facility] Lab, [address], [place], [place] [address]    Sodium 12/30/2023 142  135 - 145 mmol/L Final   Potassium 12/30/2023 4.4  3.5 - 5.1 mmol/L Final   Chloride 12/30/2023 105  98 - 111 mmol/L Final   BUN 12/30/2023 23  8 - 23 mg/dL Final   Creatinine, Ser 12/30/2023 1.20  0.61 - 1.24 mg/dL Final   Glucose, Bld 90/79/7974 126 (H)  70 - 99 mg/dL Final   Glucose reference range applies only to samples taken after fasting for at least 8 hours.   Calcium , Ion 12/30/2023 1.20  1.15 - 1.40 mmol/L Final   TCO2 12/30/2023 28  22 - 32 mmol/L Final   Hemoglobin 12/30/2023 16.0  13.0 - 17.0 g/dL Final   HCT 90/79/7974 47.0  39.0 - 52.0 % Final  Admission on 12/23/2023, Discharged on 12/25/2023  Component Date Value Ref Range Status   Sodium 12/23/2023 139  135 - 145 mmol/L Final   Potassium 12/23/2023 4.0  3.5 - 5.1 mmol/L Final   Chloride 12/23/2023 103  98 - 111 mmol/L Final   CO2 12/23/2023 25  22 - 32 mmol/L Final   Glucose, Bld 12/23/2023 100 (H)  70 - 99 mg/dL Final   Glucose reference range applies only to samples taken after fasting for at least 8 hours.   BUN 12/23/2023 17  8 - 23 mg/dL Final   Creatinine, Ser  12/23/2023 0.98  0.61 - 1.24 mg/dL Final   Calcium  12/23/2023 9.3  8.9 - 10.3 mg/dL Final   GFR, Estimated 12/23/2023 >60  >60 mL/min Final   Comment: (NOTE) Calculated using the CKD-EPI Creatinine Equation (2021)    Anion gap 12/23/2023 11  5 - 15 Final   Performed at [facility], [address] [place], [place] [address]   WBC 12/23/2023 6.2  4.0 - 10.5 K/uL Final   RBC 12/23/2023 5.30  4.22 - 5.81 MIL/uL Final   Hemoglobin 12/23/2023 15.1  13.0 - 17.0 g/dL Final   HCT 90/86/7974 48.0  39.0 - 52.0 % Final   MCV 12/23/2023 90.6  80.0 - 100.0 fL Final   MCH 12/23/2023 28.5  26.0 - 34.0 pg Final   MCHC 12/23/2023 31.5  30.0 - 36.0 g/dL Final   RDW 90/86/7974 14.6  11.5 - 15.5 % Final   Platelets 12/23/2023 239  150 - 400 K/uL Final   nRBC 12/23/2023 0.0  0.0 - 0.2 % Final   Performed at [facility] Lab, [address], [place], [place] [address]   Troponin I (High Sensitivity) 12/23/2023 19 (H)  <18 ng/L Final   Comment: (NOTE) Elevated high sensitivity troponin I (hsTnI) values and significant  changes across serial measurements may suggest ACS but many other  chronic and acute conditions are known to elevate hsTnI results.  Refer to the Links section for chest pain algorithms and additional  guidance. Performed at [facility], [address] [place], [place] [address]    Sodium 12/23/2023 141  135 - 145 mmol/L Final   Potassium 12/23/2023 3.9  3.5 - 5.1 mmol/L Final   Chloride 12/23/2023 105  98 - 111 mmol/L Final   BUN 12/23/2023 20  8 - 23 mg/dL Final   Creatinine, Ser 12/23/2023 1.00  0.61 - 1.24  mg/dL Final   Glucose, Bld 90/86/7974 100 (H)  70 - 99 mg/dL Final   Glucose reference range applies only to samples taken after fasting for at least 8 hours.   Calcium , Ion 12/23/2023 1.19  1.15 - 1.40 mmol/L Final   TCO2 12/23/2023 24  22 - 32 mmol/L Final   Hemoglobin 12/23/2023 16.7  13.0 - 17.0 g/dL Final   HCT 90/86/7974 49.0  39.0 - 52.0 % Final    Troponin I (High Sensitivity) 12/24/2023 16  <18 ng/L Final   Comment: (NOTE) Elevated high sensitivity troponin I (hsTnI) values and significant  changes across serial measurements may suggest ACS but many other  chronic and acute conditions are known to elevate hsTnI results.  Refer to the Links section for chest pain algorithms and additional  guidance. Performed at [facility], [address] [place], [place] [address]    SARS Coronavirus 2 by RT PCR 12/24/2023 NEGATIVE  NEGATIVE Final   Influenza A by PCR 12/24/2023 NEGATIVE  NEGATIVE Final   Influenza B by PCR 12/24/2023 NEGATIVE  NEGATIVE Final   Comment: (NOTE) The Xpert Xpress SARS-CoV-2/FLU/RSV plus assay is intended as an aid in the diagnosis of influenza from Nasopharyngeal swab specimens and should not be used as a sole basis for treatment. Nasal washings and aspirates are unacceptable for Xpert Xpress SARS-CoV-2/FLU/RSV testing.  Fact Sheet for Patients: BloggerCourse.com  Fact Sheet for Healthcare Providers: SeriousBroker.it  This test is not yet approved or cleared by the [place]  FDA and has been authorized for detection and/or diagnosis of SARS-CoV-2 by FDA under an Emergency Use Authorization (EUA). This EUA will remain in effect (meaning this test can be used) for the duration of the COVID-19 declaration under Section 564(b)(1) of the Act, 21 U.S.C. section 360bbb-3(b)(1), unless the authorization is terminated or revoked.     Resp Syncytial Virus by PCR 12/24/2023 NEGATIVE  NEGATIVE Final   Comment: (NOTE) Fact Sheet for Patients: BloggerCourse.com  Fact Sheet for Healthcare Providers: SeriousBroker.it  This test is not yet approved or cleared by the [place]  FDA and has been authorized for detection and/or diagnosis of SARS-CoV-2 by FDA under an Emergency Use Authorization (EUA). This  EUA will remain in effect (meaning this test can be used) for the duration of the COVID-19 declaration under Section 564(b)(1) of the Act, 21 U.S.C. section 360bbb-3(b)(1), unless the authorization is terminated or revoked.  Performed at [facility] Lab, [address], [place], [place] [address]    B Natriuretic Peptide 12/24/2023 11.7  0.0 - 100.0 pg/mL Final   Performed at [facility] Lab, [address] [place], [place] [address]   Opiates 12/24/2023 NONE DETECTED  NONE DETECTED Final   Cocaine 12/24/2023 POSITIVE (A)  NONE DETECTED Final   Benzodiazepines 12/24/2023 NONE DETECTED  NONE DETECTED Final   Amphetamines 12/24/2023 NONE DETECTED  NONE DETECTED Final   Tetrahydrocannabinol 12/24/2023 NONE DETECTED  NONE DETECTED Final   Barbiturates 12/24/2023 NONE DETECTED  NONE DETECTED Final   Comment: (NOTE) DRUG SCREEN FOR MEDICAL PURPOSES ONLY.  IF CONFIRMATION IS NEEDED FOR ANY PURPOSE, NOTIFY LAB WITHIN 5 DAYS.  LOWEST DETECTABLE LIMITS FOR URINE DRUG SCREEN Drug Class                     Cutoff (ng/mL) Amphetamine and metabolites    1000 Barbiturate and metabolites    200 Benzodiazepine  200 Opiates and metabolites        300 Cocaine and metabolites        300 THC                            50 Performed at [facility] Lab, [address], [place], [place] [address]    HIV Screen 4th Generation wRfx 12/24/2023 Non Reactive  Non Reactive Final   Performed at [facility], [address] [place], [place] [address]   Weight 12/24/2023 2,504.43  oz Final   Height 12/24/2023 72  in Final   BP 12/24/2023 151/88  mmHg Final   S' Lateral 12/24/2023 4.10  cm Final   Area-P 1/2 12/24/2023 2.91  cm2 Final   Est EF 12/24/2023 35 - 40%   Final   Digoxin  Level 12/24/2023 <0.6 (L)  0.8 - 2.0 ng/mL Final   Comment: RESULT CONFIRMED BY MANUAL DILUTION Performed at [facility], [address] [place], [place] [address]    WBC 12/25/2023 5.9   4.0 - 10.5 K/uL Final   RBC 12/25/2023 5.73  4.22 - 5.81 MIL/uL Final   Hemoglobin 12/25/2023 16.2  13.0 - 17.0 g/dL Final   HCT 90/84/7974 50.8  39.0 - 52.0 % Final   MCV 12/25/2023 88.7  80.0 - 100.0 fL Final   MCH 12/25/2023 28.3  26.0 - 34.0 pg Final   MCHC 12/25/2023 31.9  30.0 - 36.0 g/dL Final   RDW 90/84/7974 14.6  11.5 - 15.5 % Final   Platelets 12/25/2023 243  150 - 400 K/uL Final   nRBC 12/25/2023 0.0  0.0 - 0.2 % Final   Performed at [facility], [address], [place], [place] [address]   Sodium 12/25/2023 139  135 - 145 mmol/L Final   Potassium 12/25/2023 3.5  3.5 - 5.1 mmol/L Final   Chloride 12/25/2023 101  98 - 111 mmol/L Final   CO2 12/25/2023 26  22 - 32 mmol/L Final   Glucose, Bld 12/25/2023 109 (H)  70 - 99 mg/dL Final   Glucose reference range applies only to samples taken after fasting for at least 8 hours.   BUN 12/25/2023 22  8 - 23 mg/dL Final   Creatinine, Ser 12/25/2023 1.32 (H)  0.61 - 1.24 mg/dL Final   Calcium  12/25/2023 8.9  8.9 - 10.3 mg/dL Final   GFR, Estimated 12/25/2023 >60  >60 mL/min Final   Comment: (NOTE) Calculated using the CKD-EPI Creatinine Equation (2021)    Anion gap 12/25/2023 12  5 - 15 Final   Performed at [facility] Lab, [address], [place], [place] [address]    Blood Alcohol level:  Lab Results  Component Value Date   [facility] <15 01/18/2024   ETH <10 03/15/2022    Metabolic Disorder Labs: Lab Results  Component Value Date   HGBA1C 5.7 (H) 01/18/2024   MPG 116.89 01/18/2024   MPG 131.24 02/26/2022   No results found for: PROLACTIN Lab Results  Component Value Date   CHOL 92 01/18/2024   TRIG 34 01/18/2024   HDL 46 01/18/2024   CHOLHDL 2.0 01/18/2024   VLDL 7 01/18/2024   LDLCALC 39 01/18/2024   LDLCALC 69 07/31/2021    Therapeutic Lab Levels: No results found for: LITHIUM No results found for: VALPROATE No results found for: CBMZ  Physical Findings   AUDIT    Flowsheet Row ED to  Hosp-Admission (Discharged) from 01/13/2022 in [facility] 3E HF PCU  Alcohol Use Disorder Identification Test Final Score (AUDIT) 3   PHQ2-9    Flowsheet Row ED from 01/18/2024 in [facility] Most recent reading at 01/22/2024 12:30 PM ED from 01/18/2024 in [facility] Most recent reading at 01/18/2024  2:49 PM  PHQ-2 Total Score 1 6  PHQ-9 Total Score 1 18   Flowsheet Row ED from 01/18/2024 in [facility] Most recent reading at 01/18/2024  2:54 PM ED from 01/18/2024 in [facility] Most recent reading at 01/18/2024 12:01 PM ED from 01/11/2024 in [facility] Emergency Department at [facility] Most recent reading at 01/11/2024  9:40 PM  C-SSRS RISK CATEGORY No Risk No Risk No Risk     Musculoskeletal  Strength & Muscle Tone: within normal limits Gait & Station: normal Patient leans: N/A  Psychiatric Specialty Exam  Presentation  General Appearance:  Appropriate for Environment  Eye Contact: Good  Speech: Clear and Coherent; Normal Rate  Speech Volume: Normal  Handedness: Right   Mood and Affect  Mood: Anxious  Affect: Appropriate   Thought Process  Thought Processes: Coherent; Goal Directed  Descriptions of Associations:Intact  Orientation:Full (Time, Place and Person)  Thought Content:WDL  Diagnosis of Schizophrenia or Schizoaffective disorder in past: No    Hallucinations:Hallucinations: None  Ideas of Reference:None  Suicidal Thoughts:Suicidal Thoughts: No  Homicidal Thoughts:Homicidal Thoughts: No   Sensorium  Memory: Immediate Fair; Recent Fair  Judgment: Fair  Insight: Good   Executive Functions  Concentration: Fair  Attention Span: Fair  Recall: Good  Fund of Knowledge: Fair  Language: Good   Psychomotor Activity  Psychomotor Activity: Psychomotor Activity: Normal   Assets   Assets: Communication Skills; Desire for Improvement   Sleep  Sleep: Sleep: Good  Estimated Sleeping Duration (Last 24 Hours): 11.25-11.75 hours  Nutritional Assessment (For OBS and FBC admissions only) Has the patient had a weight loss or gain of 10 pounds or more in the last 3 months?: No Has the patient had a decrease in food intake/or appetite?: No Does the patient have dental problems?: No Does the patient have eating habits or behaviors that may be indicators of an eating disorder including binging or inducing vomiting?: No Has the patient recently lost weight without trying?: 0 Has the patient been eating poorly because of a decreased appetite?: 0 Malnutrition Screening Tool Score: 0    Physical Exam  Physical Exam Vitals reviewed.  Constitutional:      Appearance: Normal appearance.  HENT:     Head: Normocephalic and atraumatic.     Mouth/Throat:     Mouth: Mucous membranes are moist.  Cardiovascular:     Rate and Rhythm: Normal rate.  Pulmonary:     Effort: Pulmonary effort is normal.  Musculoskeletal:        General: Normal range of motion.     Cervical back: Normal range of motion.  Skin:    General: Skin is dry.  Neurological:     Mental Status: He is alert and oriented to person, place, and time.  Psychiatric:        Attention and Perception: Attention and perception normal.        Mood and Affect: Mood is anxious. Affect is angry.        Speech: Speech normal.        Behavior: Behavior is agitated. Behavior is cooperative.  Thought Content: Thought content normal.        Cognition and Memory: Cognition and memory normal.        Judgment: Judgment normal.    Review of Systems  Psychiatric/Behavioral:  Positive for substance abuse. The patient is nervous/anxious.   All other systems reviewed and are negative.  Blood pressure 135/80, pulse 65, temperature 97.6 F (36.4 C), temperature source Oral, resp. rate 17, SpO2 100%. There is no height  or weight on file to calculate BMI.  Treatment Plan Summary: Daily contact with patient to assess and evaluate symptoms and progress in treatment, Medication management, and Plan:  Continue Abilify  10 mg by mouth daily for mood stabilization and hx of schizoaffective disorder Continue Celexa  20 mg daily for depression Continue albuterol  1 to 2 puffs every 6 hours as needed for shortness of breath/asthma Continue aspirin  81 mg p.o. daily for extensive cardiac history Continue digoxin  0.125 mg p.o. daily for history of CHF Continue losartan  25 mg p.o. daily for history of hypertension Continue Xarelto  20 mg p.o. daily for cardiac history/prevent clots  Continue spironolactone  12.5 mg daily for cardiac history/CHF Continue torsemide  20 mg daily for cardiac history/CHF Continue atorvastatin  80 mg daily for hyperlipidemia Continue Jardiance  10 mg p.o. daily for history of diabetes type 2 Continue Abilify  10 mg by mouth daily for mood stabilization and hx of schizoaffective disorder    Tosin Jamacia Jester, NP 01/22/2024 1:55 PM

## 2024-01-22 NOTE — ED Notes (Signed)
 Pt has been observed in dayroom and on phone.  Irritable at times.   Pt reporting he had things to do.  Pt was wanting discharge intially, but is agreeable to staying to allow for patient to get placement possibly at [facility] Denied current SI plan and intent.  Denied HI and A/V hallucinations. Q 15 minute observations for safety continue

## 2024-01-22 NOTE — ED Notes (Signed)
 Pt requested for his clothes to be washed for tomorrow use when discharge. Pt clothes has been washed.

## 2024-01-22 NOTE — ED Notes (Signed)
 Pt presents in room  calm upon approach.  No needs identified at present Q 15 minute observations for safety continue

## 2024-01-22 NOTE — ED Notes (Signed)
 Pt is sleeping in his room. No cute distress noted. Q15 safety checks in place.

## 2024-01-22 NOTE — ED Notes (Signed)
 Paitent had dinner.

## 2024-01-22 NOTE — Discharge Instructions (Signed)
 Based on the information that you have provided and the presenting issues outpatient services and resources for have been recommended.  It is imperative that you follow through with treatment recommendations within 5-7 days from the of discharge to mitigate further risk to your safety and mental well-being. A list of referrals has been provided below to get you started.  You are not limited to the list provided.  In case of an urgent crisis, you may contact the Mobile Crisis Unit with [facility] at 1.305-767-5520.  Get help right away if: You have thoughts about hurting yourself or others. Get help right away if you feel like you may hurt yourself or others, or have thoughts about taking your own life. Go to your nearest emergency room or: Call 911. Call the National Suicide Prevention Lifeline at 704-107-2627 or 988 in the [place] This is open 24 hours a day. If you're a Veteran: Call 988 and press 1. This is open 24 hours a day. Text the [employer] at (301)689-5495. Summary Mental health is not just the absence of mental illness. It involves understanding your emotions and behaviors, and taking steps to manage them in a healthy way. If you have symptoms of mental or emotional distress, get help from family, friends, a health care provider, or a mental health professional. Practice good mental health behaviors such as stress management skills, self-calming skills, exercise, healthy sleeping and eating, and supportive relationships. This information is not intended to replace advice given to you by your health care provider. Make sure you discuss any questions you have with your health care provider.  Education provided on the fact that if experiencing worsening of psychiatry symptoms including suicidal ideations, homicidal ideations, or having auditory/visual hallucinations, etc, to call 911, 988, come back to this location, or go to the nearest ER.  Pt verbalized  understanding.                                                         Substance Abuse Facilities for [employer]  Substance Use Disorder (SUD) Program Learn more about Substance Use Disorder (SUD) NOTE: The email and phone numbers provided for the SUD Programs are for information inquiries and are not continuously monitored. [facility] without a specific SUD Program do offer SUD Treatment. Contact your local [facility] and ask for the Mental Health clinic. Many [facility] and [facility] Community Based Outpatient Clinics also offer SUD treatment. If you need immediate assistance, contact 911 or 1-800-273-TALK/8255.       [facility] [address] [place], [place] [address] Phone: [phone] Or [phone]  SUD Standard Outpatient Judi Swayne Colfax, LCAS:  [phone] SUD 24-Hour Care (Residential) Carle Lundborg [facility] admission coordinator):  [phone] X [phone]  SUD Standard Outpatient Rosina Simpers:  [phone] X [phone]     [facility] [address] [place], [place] [address] Phone: [phone] Or [phone]  SUD 24-Hour Care (Residential) Administrative contact is not available. Call the [facility] listed above. SUD Intensive Outpatient Administrative contact is not available. Call the [facility] listed above. SUD Standard Outpatient Administrative contact is not available. Call the [facility] listed above.     [facility] [address] [place], [place]  [address] Phone: [phone]   SUD Intensive Outpatient Starleen Hummer, Substance Use Disorder Intensive Outpatient Program:  [phone] SUD Standard Outpatient Starleen Hummer, Substance Use Disorder Intensive Outpatient Program:  [phone]       Tolbert GLENWOOD BANNISTER [phone]) [facility] [address] [place], [place] [address] Phone: [phone] Or  [phone]   SUD 24-Hour Care (Residential) Tom Stagg (SATP, IOP, SARRTP, SUD OP,SAS OP, Buprenorphine Outpatient Clinics):  [phone] SUD Intensive Outpatient Tom Stagg (SATP, IOP, SARRTP, SUD OP,SAS OP, Buprenorphine Outpatient Clinics):  [phone] SUD Standard Outpatient Tom Stagg (SATP, IOP, SARRTP, SUD OP,SAS OP, Buprenorphine Outpatient Clinics):  [phone]   VA Employees: To update contact information in the SUD Program Locator, contact [email or web address]

## 2024-01-23 DIAGNOSIS — F1410 Cocaine abuse, uncomplicated: Secondary | ICD-10-CM

## 2024-01-23 MED ORDER — NICOTINE POLACRILEX 4 MG MT GUM: 4.0000 mg | CHEWING_GUM | OROMUCOSAL | 0 refills | Status: AC | PRN

## 2024-01-23 NOTE — ED Notes (Signed)
 Patient alert & oriented x4. Denies intent to harm self or others when asked. Denies A/VH. Patient denies any physical complaints when asked. No acute distress noted. Certain scheduled medications administered with no complications, patient requested only specific medications to be administered this am as other medications were night time medications. Support and encouragement provided. Patient observed in milieu. No inappropriate behaviors observed or reported. Routine safety checks conducted per facility protocol. Encouraged patient to notify staff if any thoughts of harm towards self or others arise. Patient verbalizes understanding and agreement.

## 2024-01-23 NOTE — ED Notes (Signed)
 Patient discharged home per provider order. After Visit Summary (AVS) printed and given to patient, as well as a copy of the suicide safety plan. AVS reviewed with patient and all questions fully answered. Patient discharged in no acute distress, A& O x4 and ambulatory. Patient denied SI/HI, A/VH upon discharge. Patient verbalized understanding of all discharge instructions explained by staff, including follow up appointments, RX's and safety plan. Patient mood fair. Patient belongings returned to patient from locker #26 complete and intact. Patient escorted to lobby via staff for transport to destination. Safety maintained.

## 2024-01-23 NOTE — ED Notes (Signed)
 Pt is scheduled nicotine  gum, but pt is sleeping. Will administer when pt wakes up.

## 2024-01-23 NOTE — Care Management (Signed)
 [facility] Care management...  Writer spoke with patient.  Writer advised patient of discharging today 01/23/24 by 11 am to [facility] [address] [place]. Transportation will be provided.  Clinical research associate provided patient with community resources and information for [employer].  Patient reporting speaking to case working and having voucher for HUD housing. Patient reporting having an appointment next Tuesday 01/30/24 to complete paperwork for voucher.
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
## 2024-01-26 ENCOUNTER — Telehealth (HOSPITAL_COMMUNITY): Payer: Self-pay

## 2024-01-26 NOTE — Telephone Encounter (Signed)
 Called to confirm/remind patient of their appointment at the [facility] on 01/29/24 11:45.   Appointment:   [x] Confirmed  [] Left mess   [] No answer/No voice mail  [] VM Full/unable to leave message  [] Phone not in service  Patient reminded to bring all medications and/or complete list.  Confirmed patient has transportation. Gave directions, instructed to utilize valet parking.

## 2024-01-29 ENCOUNTER — Encounter (HOSPITAL_COMMUNITY): Payer: Self-pay

## 2024-01-29 ENCOUNTER — Ambulatory Visit (HOSPITAL_COMMUNITY)
Admission: RE | Admit: 2024-01-29 | Discharge: 2024-01-29 | Disposition: A | Discharge status: Home / Self Care | Source: Ambulatory Visit | Attending: Cardiology | Admitting: Cardiology

## 2024-01-29 ENCOUNTER — Ambulatory Visit (HOSPITAL_COMMUNITY): Payer: Self-pay | Admitting: Cardiology

## 2024-01-29 VITALS — BP 138/72 | HR 80 | Ht 72.0 in | Wt 204.0 lb

## 2024-01-29 DIAGNOSIS — I2510 Atherosclerotic heart disease of native coronary artery without angina pectoris: Secondary | ICD-10-CM | POA: Diagnosis not present

## 2024-01-29 DIAGNOSIS — Z79899 Other long term (current) drug therapy: Secondary | ICD-10-CM | POA: Diagnosis not present

## 2024-01-29 DIAGNOSIS — Z86718 Personal history of other venous thrombosis and embolism: Secondary | ICD-10-CM | POA: Diagnosis not present

## 2024-01-29 DIAGNOSIS — Z8249 Family history of ischemic heart disease and other diseases of the circulatory system: Secondary | ICD-10-CM | POA: Insufficient documentation

## 2024-01-29 DIAGNOSIS — Z7982 Long term (current) use of aspirin: Secondary | ICD-10-CM | POA: Insufficient documentation

## 2024-01-29 DIAGNOSIS — M25579 Pain in unspecified ankle and joints of unspecified foot: Secondary | ICD-10-CM | POA: Insufficient documentation

## 2024-01-29 DIAGNOSIS — Z7901 Long term (current) use of anticoagulants: Secondary | ICD-10-CM | POA: Insufficient documentation

## 2024-01-29 DIAGNOSIS — M25569 Pain in unspecified knee: Secondary | ICD-10-CM | POA: Insufficient documentation

## 2024-01-29 DIAGNOSIS — J449 Chronic obstructive pulmonary disease, unspecified: Secondary | ICD-10-CM

## 2024-01-29 DIAGNOSIS — F17210 Nicotine dependence, cigarettes, uncomplicated: Secondary | ICD-10-CM | POA: Diagnosis not present

## 2024-01-29 DIAGNOSIS — M1990 Unspecified osteoarthritis, unspecified site: Secondary | ICD-10-CM | POA: Diagnosis not present

## 2024-01-29 DIAGNOSIS — F1910 Other psychoactive substance abuse, uncomplicated: Secondary | ICD-10-CM

## 2024-01-29 DIAGNOSIS — J4489 Other specified chronic obstructive pulmonary disease: Secondary | ICD-10-CM | POA: Diagnosis not present

## 2024-01-29 DIAGNOSIS — R3589 Other polyuria: Secondary | ICD-10-CM | POA: Diagnosis not present

## 2024-01-29 DIAGNOSIS — I130 Hypertensive heart and chronic kidney disease with heart failure and stage 1 through stage 4 chronic kidney disease, or unspecified chronic kidney disease: Secondary | ICD-10-CM | POA: Diagnosis not present

## 2024-01-29 DIAGNOSIS — G8929 Other chronic pain: Secondary | ICD-10-CM

## 2024-01-29 DIAGNOSIS — Z7984 Long term (current) use of oral hypoglycemic drugs: Secondary | ICD-10-CM | POA: Diagnosis not present

## 2024-01-29 DIAGNOSIS — R911 Solitary pulmonary nodule: Secondary | ICD-10-CM | POA: Insufficient documentation

## 2024-01-29 DIAGNOSIS — Z91148 Patient's other noncompliance with medication regimen for other reason: Secondary | ICD-10-CM | POA: Diagnosis not present

## 2024-01-29 DIAGNOSIS — I5022 Chronic systolic (congestive) heart failure: Secondary | ICD-10-CM | POA: Insufficient documentation

## 2024-01-29 DIAGNOSIS — Z5902 Unsheltered homelessness: Secondary | ICD-10-CM | POA: Diagnosis not present

## 2024-01-29 DIAGNOSIS — N183 Chronic kidney disease, stage 3 unspecified: Secondary | ICD-10-CM | POA: Insufficient documentation

## 2024-01-29 DIAGNOSIS — F1410 Cocaine abuse, uncomplicated: Secondary | ICD-10-CM | POA: Diagnosis not present

## 2024-01-29 DIAGNOSIS — I10 Essential (primary) hypertension: Secondary | ICD-10-CM

## 2024-01-29 LAB — BRAIN NATRIURETIC PEPTIDE: B Natriuretic Peptide: 152.6 pg/mL — ABNORMAL HIGH (ref 0.0–100.0)

## 2024-01-29 LAB — BASIC METABOLIC PANEL WITH GFR
Anion gap: 10 (ref 5–15)
BUN: 12 mg/dL (ref 8–23)
CO2: 24 mmol/L (ref 22–32)
Calcium: 9.1 mg/dL (ref 8.9–10.3)
Chloride: 105 mmol/L (ref 98–111)
Creatinine, Ser: 0.94 mg/dL (ref 0.61–1.24)
GFR, Estimated: >60 mL/min (ref >60)
Glucose, Bld: 90 mg/dL (ref 70–99)
Potassium: 4.2 mmol/L (ref 3.5–5.1)
Sodium: 139 mmol/L (ref 135–145)

## 2024-01-29 LAB — URIC ACID: Uric Acid, Serum: 6.1 mg/dL (ref 3.7–8.6)

## 2024-01-29 LAB — DIGOXIN LEVEL: Digoxin Level: <0.6 ng/mL — ABNORMAL LOW (ref 0.8–2.0)

## 2024-01-29 MED ORDER — ATORVASTATIN CALCIUM 80 MG PO TABS: 80.0000 mg | ORAL_TABLET | Freq: Every day | ORAL | 1 refills | Status: DC

## 2024-01-29 MED ORDER — SPIRONOLACTONE 25 MG PO TABS: 25.0000 mg | ORAL_TABLET | Freq: Every day | ORAL | 1 refills | Status: DC

## 2024-01-29 MED ORDER — LOSARTAN POTASSIUM 25 MG PO TABS: 25.0000 mg | ORAL_TABLET | Freq: Every day | ORAL | 1 refills | Status: DC
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
## 2024-01-29 NOTE — Progress Notes (Signed)
 HEART & VASCULAR TRANSITION OF CARE NOTE   Referring Physician: Dr. Kassie PCP: Celestia Rosaline SQUIBB, NP   HPI: Referred to clinic by Dr. Kassie for heart failure consultation.   Allen Mayer is a 63 y.o. homeless male with chronic HFrEF, LV thrombus on Xarelto , COPD, hypertension, CKD stage III, schizoaffective disorder, and polysubstance abuse (tobacco, cocaine, ETOH).  He was admitted 9/25 with chest pain, pleuritic in nature.  Cardiac workup relatively negative.  Drug screen positive for cocaine.  Echo with EF 35-40%, mild LVH, GIDD, RV normal, trivial MR.  Suspected increase in EF 2/2 him being compliant with medications while incarcerated, previously less than 20%.  Incidental finding of 5 mm nodule in the superior segment of the right lower lobe with an unchanged 0.9 cm broad-based pleural nodule overlying the posterior medial left upper lobe.  GDMT restarted at discharge.  He was seen 01/08/24 in for [facility] visit. Living on the streets since discharge. Denied cocaine use. Brother and mom passed away from MI. Has children but does not keep up with them. Got out of jail 11/17/23. 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 He gets disability in ~30 days, awaiting check. Prior to incarceration he was in a hotel / apartment through the [place].   Spent a few days at behavioral health for substance abuse. Discussed going to rehab, however would not be able to make it to his medical appointments.   Today he returns for transition of care visit follow up. Overall feeling well. NYHA I-II. Denies chest pain, dyspnea, fatigue, palpitations, dizziness, and abnormal bleeding. Only compliant is knee and ankle pain that comes and goes. Remains homeless, awaiting for [facility] housing to get back to him. Uses bus for transportation. Able to perform ADLs. Appetite okay. Compliant with all medications, except Torsemide  due to difficulties with polyuria and lack of accessible bathroom. Reports that he will occasionally have a  beer or 2. Denies cocaine use, however UDS at [facility] urgent care + for cocaine. Has cut back to 2-3 cigarettes/day.  Cardiac Testing  Echo 9/25 EF 35-40%, mild LVH, GIDD, RV normal, trivial MR. Limited echo 7/23 EF less than 20% L/RHC 4/23: RA 4, PA 32/15, PCWP 16, Fick CO/CI 3.4/1.7.  Mild nonobstructive CAD. L/RHC 9/19: RA 9, PA 52/31 (41), PWP 26, LVEDP 30 Fick CO/CI 5.29/2.44.  Clean coronaries  Past Medical History:  Diagnosis Date   Acid reflux    Alcohol abuse    Arthritis    Asthma    Back pain    Bronchitis    Chest pain 07/31/2021   CHF (congestive heart failure) (HCC)    CKD (chronic kidney disease)    CKD (chronic kidney disease), stage III (HCC)    Cocaine abuse (HCC)    COPD (chronic obstructive pulmonary disease) (HCC)    History of noncompliance with medical treatment, presenting hazards to health    Homelessness    Hypertension    LV (left ventricular) mural thrombus    Myocardial infarction [facility])    Neuropathic pain    NICM (nonischemic cardiomyopathy) (HCC) 2019   NSTEMI (non-ST elevated myocardial infarction) (HCC) 07/31/2021   NSVT (nonsustained ventricular tachycardia) (HCC)    Pericardial effusion    Pulmonary hypertension (HCC)    RVF (right ventricular failure) (HCC)    Schizo affective schizophrenia (HCC)     Current Outpatient Medications  Medication Sig Dispense Refill   albuterol  (VENTOLIN  HFA) 108 (90 Base) MCG/ACT inhaler Inhale 1-2 puffs into the lungs  every 6 (six) hours as needed for wheezing or shortness of breath. 18 g 0   ARIPiprazole  (ABILIFY ) 10 MG tablet Take 1 tablet (10 mg total) by mouth daily. 90 tablet 0   aspirin  EC 81 MG tablet Take 1 tablet (81 mg total) by mouth daily. Swallow whole. 90 tablet 1   atorvastatin  (LIPITOR ) 80 MG tablet Take 1 tablet (80 mg total) by mouth daily. 90 tablet 1   citalopram  (CELEXA ) 20 MG tablet Take 1 tablet (20 mg total) by mouth daily. 90 tablet 0   digoxin  (LANOXIN ) 0.125 MG tablet Take 1 tablet  (0.125 mg total) by mouth daily. 30 tablet 1   empagliflozin  (JARDIANCE ) 10 MG TABS tablet Take 1 tablet (10 mg total) by mouth daily. 90 tablet 1   gabapentin  (NEURONTIN ) 400 MG capsule Take 1 capsule (400 mg total) by mouth daily as needed (nerve pain). (Patient taking differently: Take 400 mg by mouth 2 (two) times daily as needed (nerve pain).) 90 capsule 0   losartan  (COZAAR ) 25 MG tablet Take 1 tablet (25 mg total) by mouth daily. 90 tablet 1   meloxicam  (MOBIC ) 7.5 MG tablet Take 1 tablet (7.5 mg total) by mouth daily. (Patient not taking: No sig reported) 5 tablet 0   mometasone -formoterol  (DULERA ) 200-5 MCG/ACT AERO Inhale 2 puffs into the lungs 2 (two) times daily.     nicotine  polacrilex (NICORETTE) 4 MG gum Take 1 each (4 mg total) by mouth as needed for smoking cessation. 150 each 0   rivaroxaban  (XARELTO ) 20 MG TABS tablet Take 1 tablet (20 mg total) by mouth daily with supper. 60 tablet 0   spironolactone  (ALDACTONE ) 25 MG tablet Take 1 tablet (25 mg total) by mouth daily. 90 tablet 1   torsemide  (DEMADEX ) 20 MG tablet Take 1 tablet (20 mg total) by mouth daily. 90 tablet 1   umeclidinium bromide  (INCRUSE ELLIPTA ) 62.5 MCG/ACT AEPB Inhale 1 puff into the lungs daily.     No current facility-administered medications for this visit.    Allergies  Allergen Reactions   Flexeril  [Cyclobenzaprine ] Other (See Comments)    Caused cramping and patient did not like the way it made it feel. Requests this medication to NOT be given to him.     Social History   Socioeconomic History   Marital status: Divorced    Spouse name: Not on file   Number of children: 4   Years of education: Not on file   Highest education level: High school graduate  Occupational History   Occupation: disability  Tobacco Use   Smoking status: Every Day    Current packs/day: 0.50    Types: Cigarettes   Smokeless tobacco: Never  Vaping Use   Vaping status: Never Used  Substance and Sexual Activity    Alcohol use: Yes    Comment: occasionally   Drug use: Yes    Types: Cocaine   Sexual activity: Not on file  Other Topics Concern   Not on file  Social History Narrative   ** Merged History Encounter **       Lives with fiance.     Social Drivers of Health   Financial Resource Strain: High Risk (12/25/2023)   Overall Financial Resource Strain (CARDIA)    Difficulty of Paying Living Expenses: Very hard  Food Insecurity: Food Insecurity Present (01/18/2024)   Hunger Vital Sign    Worried About Running Out of Food in the Last Year: Often true    Ran Out of Food  in the Last Year: Often true  Transportation Needs: Unmet Transportation Needs (01/18/2024)   PRAPARE - Administrator, Civil Service (Medical): Yes    Lack of Transportation (Non-Medical): Yes  Physical Activity: Not on File (07/29/2021)   Received [facility]   Physical Activity    Physical Activity: 0  Stress: Not on File (07/29/2021)   [facility] from [facility]   Stress    Stress: 0  Social Connections: Not on File (12/24/2022)   Received from [facility]   Social Connections    Connectedness: 0  Intimate Partner Violence: Not At Risk (01/18/2024)   Humiliation, Afraid, Rape, and Kick questionnaire    Fear of Current or Ex-Partner: No    Emotionally Abused: No    Physically Abused: No    Sexually Abused: No      Family History  Problem Relation Age of Onset   Heart attack Mother        Died age 66   Heart attack Brother        109    Blood pressure 138/72, pulse 80, height 6' (1.829 m), weight 92.5 kg (204 lb), SpO2 97%.  Filed Weights   01/29/24 1155  Weight: 92.5 kg (204 lb)   PHYSICAL EXAM: General: Well appearing. No distress on RA Cardiac: JVP flat. S1 and S2 present. No murmurs  Abdomen: Soft, non-tender, distended.  Extremities: Warm and dry.  No peripheral edema.  Neuro: Alert and oriented x3. Affect pleasant. Moves all extremities without difficulty.  ASSESSMENT & PLAN: 1. Chronic HFrEF -  L/RHC 4/23: RA 4, PA 32/15, PCWP 16, Fick CO/CI 3.4/1.7.  Mild nonobstructive CAD. - Limited echo 7/23 EF less than 20% - Echo 9/25 EF 35-40%, mild LVH, GIDD, RV normal, trivial MR. - Suspect EF improved 2/2 improved compliance while incarcerated.  - NYHA I-II. Appears mildly volume up on exam. Continue Torsemide  20 mg daily. Taking sometimes, discussed importance of taking daily, however barriers are very understandable. BMET/BNP today - GDMT:  ? blocker: avoid with active cocaine use; continue digoxin  0.125 mg daily; level today ARB/ARNI: continue losartan  25 mg daily MRA: continue spironolactone  25 mg daily SGLT2i: continue Jardiance  10 mg daily - Active drug use precludes advanced therapies. Need better compliance with GDMT. Refills sent.  - Recommend repeat echo in 2-3 months. Compliance has been improving somewhat. Concern about picking up medications after he runs out. [facility] patient.  2. Hypertension - BP controlled, can titrate up ARB if needed.  3. Mild nonobstructive CAD - As seen on [facility] 4/23 - Continue ASA and statin. LDL 39 - no chest pain  4. Polysubstance abuse (cocaine/tobacco) - has cut back to 2-3 cigarettes/day - actively using cocaine, has been seeking assistance with behavioral health - last UDS 01/18/24 + cocaine  5. COPD - refer to pulmonology; reports he is using his inhalers  6. Hx of LV thrombus - Has not been seen on echos over the last 2 years. EF mildly improved.  - Continue Xarelto , reports compliance  7. Knee/Ankle Pain - reports that he has osteoarthritis - does not know if he has ever had gout in the past - check uric acid  8. SDOH:  - PCP appointment 02/19/24. Rides the bus to appts. Writes down appts to keep up with them.  - given a phone today in clinic; needs to schedule referred appointments.  - would benefit from bubble packs once GDMT titrated - awaiting disability check and VA housing   Referred to HFSW (  PCP, Medications, Transportation,  ETOH Abuse, Drug Abuse, Insurance, Financial): Yes Refer to Pharmacy: No Refer to Home Health: No Refer to [facility] Clinic: No  Refer to General Cardiology: Yes  PCP appointment 02/19/24  Referred to [facility] and Pulmonology. Given a phone today so that he is able to make these appointments.   [place] Leandrea Ackley, NP 01/29/24

## 2024-01-29 NOTE — Patient Instructions (Signed)
 Medication Changes:  No Changes In Medications at this time.   MEDICATIONS REFILLED   Lab Work:  Labs done today, your results will be available in MyChart, we will contact you for abnormal readings.  Referrals:  YOU HAVE BEEN REFERRED TO PULMONOLOGY THEY WILL REACH OUT TO YOU OR CALL TO ARRANGE THIS. PLEASE CALL US  WITH ANY CONCERNS   Follow-Up in: WITH GENERAL CARDIOLOGY AS SCHEDULED   At the [facility], you and your health needs are our priority. We have a designated team specialized in the treatment of Heart Failure. This Care Team includes your primary Heart Failure Specialized Cardiologist (physician), Advanced Practice Providers (APPs- Physician Assistants and Nurse Practitioners), and Pharmacist who all work together to provide you with the care you need, when you need it.   You may see any of the following providers on your designated Care Team at your next follow up:  Dr. Toribio Fuel Dr. Ezra Shuck Dr. Ria Commander Dr. Odis Brownie Greig Mosses, NP Caffie Shed, [place] [facility] [place], [place] Beckey Coe, NP [place] Lee, NP Tinnie Redman, PharmD  Please be sure to bring in all your medications bottles to every appointment.   Need to Contact Us :  If you have any questions or concerns before your next appointment please send us  a message through [facility] or call our office at [phone].    TO LEAVE A MESSAGE FOR THE NURSE SELECT OPTION 2, PLEASE LEAVE A MESSAGE INCLUDING: YOUR NAME DATE OF BIRTH CALL BACK NUMBER REASON FOR CALL**this is important as we prioritize the call backs  YOU WILL RECEIVE A CALL BACK THE SAME DAY AS LONG AS YOU CALL BEFORE 4:00 PM

## 2024-02-19 ENCOUNTER — Ambulatory Visit (INDEPENDENT_AMBULATORY_CARE_PROVIDER_SITE_OTHER): Admitting: Primary Care

## 2024-02-23 ENCOUNTER — Emergency Department (HOSPITAL_COMMUNITY)
Admission: EM | Admit: 2024-02-23 | Discharge: 2024-02-23 | Disposition: A | Discharge status: Home / Self Care | Attending: Emergency Medicine | Admitting: Emergency Medicine

## 2024-02-23 ENCOUNTER — Encounter (HOSPITAL_COMMUNITY): Payer: Self-pay

## 2024-02-23 ENCOUNTER — Other Ambulatory Visit: Payer: Self-pay

## 2024-02-23 DIAGNOSIS — M2550 Pain in unspecified joint: Secondary | ICD-10-CM | POA: Diagnosis present

## 2024-02-23 MED ORDER — OXYCODONE-ACETAMINOPHEN 5-325 MG PO TABS
1.0000 | ORAL_TABLET | Freq: Once | ORAL | Status: AC
  Administered 2024-02-23: 1 via ORAL
  Filled 2024-02-23: qty 1

## 2024-02-23 NOTE — ED Triage Notes (Signed)
 Pt bib pov c/o bilateral knee, feet, and neck pain that started today. Pt states he was told he had degenerative bone disease. Pt says he does walk a lot but he just don't feel right.  Pt states he takes his regular pain medicine gabapentin  and aspirin  without relief.   Pt denies injury.

## 2024-02-23 NOTE — ED Provider Notes (Signed)
 [facility]-EMERGENCY DEPT [facility] Emergency Department Provider Note MRN:  [id number]  Arrival date & time: 02/23/24     Chief Complaint   Knee Pain and Foot Pain   History of Present Illness   Allen Mayer is a 63 y.o. year-old male presents to the ED with chief complaint of joint pain.  He states that he has pains in his feet, knees, and shoulders.  He states that these pains are not new for him. He states that he thinks he was walking too much yesterday and it made his joints sore. He states that he felt hot in triage, but he denies fever.  Denies any other new symptoms tonight.  History provided by patient.   Review of Systems  Pertinent positive and negative review of systems noted in HPI.    Physical Exam   Vitals:   02/23/24 0147 02/23/24 0535  BP: (!) 147/105 (!) 144/86  Pulse: 96 84  Resp: 16 16  Temp: (!) 97.5 F (36.4 C) 98 F (36.7 C)  SpO2: 100% 99%    CONSTITUTIONAL:  non toxic-appearing, NAD NEURO:  Alert and oriented x 3, CN 3-12 grossly intact EYES:  eyes equal and reactive ENT/NECK:  Supple, no stridor  CARDIO:  normal rate, regular rhythm, appears well-perfused  PULM:  No respiratory distress, CTAB GI/GU:  non-distended,  MSK/SPINE:  No gross deformities, no edema, moves all extremities  SKIN:  no rash, atraumatic   *Additional and/or pertinent findings included in MDM below  Diagnostic and Interventional Summary    EKG Interpretation Date/Time:    Ventricular Rate:    PR Interval:    QRS Duration:    QT Interval:    QTC Calculation:   R Axis:      Text Interpretation:         Labs Reviewed - No data to display  No orders to display    Medications  oxyCODONE -acetaminophen  (PERCOCET/ROXICET) 5-325 MG per tablet 1 tablet (1 tablet Oral Given 02/23/24 0533)     Procedures  /  Critical Care Procedures  ED Course and Medical Decision Making  I have reviewed the triage vital signs, the nursing notes, and pertinent available  records from the EMR.  Social Determinants Affecting Complexity of Care: Patient has no clinically significant social determinants affecting this chief complaint..   ED Course:    Medical Decision Making Patient here with joint pains in his feet, knees, and shoulders.  States that he had been walking around too much yesterday.  He states that these are new problems, but that they are worse today.  Will treat his acute pain in the ED and have him follow-up outpatient.  No obvious signs of deformity, trauma, or infection.  Risk Prescription drug management.         Consultants: No consultations were needed in caring for this patient.   Treatment and Plan: Emergency department workup does not suggest an emergent condition requiring admission or immediate intervention beyond  what has been performed at this time. The patient is safe for discharge and has  been instructed to return immediately for worsening symptoms, change in  symptoms or any other concerns    Final Clinical Impressions(s) / ED Diagnoses     ICD-10-CM   1. Arthralgia, unspecified joint  M25.50       ED Discharge Orders     None         Discharge Instructions Discussed with and Provided to Patient:   Discharge Instructions  None      Vicky Charleston, PA-C 02/23/24 9455    Bari Charmaine FALCON, MD 02/24/24 [phone]

## 2024-03-06 ENCOUNTER — Other Ambulatory Visit (HOSPITAL_COMMUNITY): Payer: Self-pay

## 2024-03-06 ENCOUNTER — Other Ambulatory Visit (HOSPITAL_COMMUNITY): Payer: Self-pay | Admitting: Cardiology

## 2024-03-06 ENCOUNTER — Telehealth (HOSPITAL_COMMUNITY): Payer: Self-pay | Admitting: Licensed Clinical Social Worker

## 2024-03-06 MED ORDER — RIVAROXABAN 20 MG PO TABS: 20.0000 mg | ORAL_TABLET | Freq: Every day | ORAL | 0 refills | Status: DC

## 2024-03-06 NOTE — Telephone Encounter (Signed)
 H&V Care Navigation CSW Progress Note  Clinical Social Worker informed pt at front asking for help getting medications.  States he is out of xarelto , spiro, and jardiance .  CSW worked with triage staff [facility]- can only fill xarelto  and jardiance  as records so spiro was filled.  Informed pt that we will not be following him ongoing and reminded he needs to set up appt with [facility].  Also informed him of missed appt with [facility]- provided number to call and arrange follow up appt.  SDOH Screenings   Food Insecurity: Food Insecurity Present (01/18/2024)  Housing: High Risk (01/08/2024)  Transportation Needs: Unmet Transportation Needs (01/18/2024)  Utilities: Not At Risk (01/18/2024)  Alcohol Screen: Low Risk  (02/25/2022)  Depression (PHQ2-9): Low Risk  (01/23/2024)  Recent Concern: Depression (PHQ2-9) - Medium Risk (01/20/2024)  Financial Resource Strain: High Risk (12/25/2023)  Physical Activity: Not on File (07/29/2021)   Received from [facility]  Social Connections: Not on File (12/24/2022)   [facility]  Stress: Not on File (07/29/2021)   Received from [facility]  Tobacco Use: High Risk (02/23/2024)   Andriette HILARIO Leech, LCSW Clinical Social Worker [facility] Desk#: [phone] Cell#: [phone]

## 2024-04-09 ENCOUNTER — Telehealth: Payer: Self-pay | Admitting: Cardiology

## 2024-04-09 ENCOUNTER — Encounter (INDEPENDENT_AMBULATORY_CARE_PROVIDER_SITE_OTHER): Payer: Self-pay | Admitting: Primary Care

## 2024-04-09 ENCOUNTER — Ambulatory Visit (INDEPENDENT_AMBULATORY_CARE_PROVIDER_SITE_OTHER): Admitting: Primary Care

## 2024-04-09 VITALS — BP 186/77 | HR 88 | Resp 16 | Ht 72.0 in | Wt 210.4 lb

## 2024-04-09 DIAGNOSIS — R7303 Prediabetes: Secondary | ICD-10-CM | POA: Diagnosis not present

## 2024-04-09 DIAGNOSIS — F419 Anxiety disorder, unspecified: Secondary | ICD-10-CM | POA: Diagnosis not present

## 2024-04-09 DIAGNOSIS — J431 Panlobular emphysema: Secondary | ICD-10-CM

## 2024-04-09 DIAGNOSIS — I10 Essential (primary) hypertension: Secondary | ICD-10-CM | POA: Diagnosis not present

## 2024-04-09 DIAGNOSIS — F32A Depression, unspecified: Secondary | ICD-10-CM | POA: Diagnosis not present

## 2024-04-09 DIAGNOSIS — I5020 Unspecified systolic (congestive) heart failure: Secondary | ICD-10-CM

## 2024-04-09 MED ORDER — CITALOPRAM HYDROBROMIDE 20 MG PO TABS: 20.0000 mg | ORAL_TABLET | Freq: Every day | ORAL | 0 refills | Status: DC

## 2024-04-09 MED ORDER — EMPAGLIFLOZIN 10 MG PO TABS: 10.0000 mg | ORAL_TABLET | Freq: Every day | ORAL | 0 refills | Status: AC

## 2024-04-09 MED ORDER — GABAPENTIN 400 MG PO CAPS: 400.0000 mg | ORAL_CAPSULE | Freq: Two times a day (BID) | ORAL | 0 refills | Status: DC | PRN

## 2024-04-09 MED ORDER — RIVAROXABAN 20 MG PO TABS: 20.0000 mg | ORAL_TABLET | Freq: Every day | ORAL | 0 refills | Status: AC

## 2024-04-09 MED ORDER — ASPIRIN 81 MG PO TBEC: 81.0000 mg | DELAYED_RELEASE_TABLET | Freq: Every day | ORAL | 0 refills | Status: AC

## 2024-04-09 MED ORDER — ATORVASTATIN CALCIUM 80 MG PO TABS: 80.0000 mg | ORAL_TABLET | Freq: Every day | ORAL | 0 refills | Status: AC

## 2024-04-09 MED ORDER — ALBUTEROL SULFATE HFA 108 (90 BASE) MCG/ACT IN AERS: 1.0000 | INHALATION_SPRAY | Freq: Four times a day (QID) | RESPIRATORY_TRACT | 0 refills | Status: DC | PRN

## 2024-04-09 MED ORDER — TORSEMIDE 20 MG PO TABS: 20.0000 mg | ORAL_TABLET | Freq: Every day | ORAL | 0 refills | Status: DC

## 2024-04-09 MED ORDER — ARIPIPRAZOLE 10 MG PO TABS: 10.0000 mg | ORAL_TABLET | Freq: Every day | ORAL | 0 refills | Status: DC

## 2024-04-09 MED ORDER — LOSARTAN POTASSIUM 25 MG PO TABS: 25.0000 mg | ORAL_TABLET | Freq: Every day | ORAL | 0 refills | Status: AC

## 2024-04-09 MED ORDER — DIGOXIN 125 MCG PO TABS: 0.1250 mg | ORAL_TABLET | Freq: Every day | ORAL | 0 refills | Status: DC

## 2024-04-09 MED ORDER — SPIRONOLACTONE 25 MG PO TABS: 25.0000 mg | ORAL_TABLET | Freq: Every day | ORAL | 0 refills | Status: AC

## 2024-04-09 NOTE — Progress Notes (Signed)
 "  New Patient Office Visit  Subjective    Patient ID: Allen Mayer male  DOB: 1960/10/04  Age: 63 y.o. MRN: [id number]   CC:     HPI  Allen Mayer is a 63 year old cardiology patient whom did not follow up with cardiology on [place] in today to establish care and requesting all medications to be refilled. Per Dr, Allayne note no refills until seen. Patient brought all his empty bottles to his appt. Reached out to 3 cardiologist and all 3 rtn call but d/w DOD Dr. Kate gave v.o to refill all cardiac medications get a cbc and cmet on him and the office will call for a f/u appt. #30 day supply given  Medications Ordered Prior to Encounter[1]   Allergies[2]  Past Medical History:  Diagnosis Date   Acid reflux    Alcohol abuse    Arthritis    Asthma    Back pain    Bronchitis    Chest pain 07/31/2021   CHF (congestive heart failure) (HCC)    CKD (chronic kidney disease)    CKD (chronic kidney disease), stage III (HCC)    Cocaine abuse (HCC)    COPD (chronic obstructive pulmonary disease) (HCC)    History of noncompliance with medical treatment, presenting hazards to health    Homelessness    Hypertension    LV (left ventricular) mural thrombus    Myocardial infarction (HCC)    Neuropathic pain    NICM (nonischemic cardiomyopathy) (HCC) 2019   NSTEMI (non-ST elevated myocardial infarction) (HCC) 07/31/2021   NSVT (nonsustained ventricular tachycardia) (HCC)    Pericardial effusion    Pulmonary hypertension (HCC)    RVF (right ventricular failure) (HCC)    Schizo affective schizophrenia (HCC)      Past Surgical History:  Procedure Laterality Date   I & D EXTREMITY Bilateral 12/14/2020   Procedure: IRRIGATION AND DEBRIDEMENT AND CLOSURE OF LACERTATIONS OF BILATEAL ARMS;  Surgeon: Dasie Leonor CROME, MD;  Location: [facility] OR;  Service: General;  Laterality: Bilateral;   LAPAROTOMY N/A 12/14/2020   Procedure: EXPLORATORY LAPAROTOMY;  Surgeon: Dasie Leonor CROME, MD;  Location:  [facility] OR;  Service: General;  Laterality: N/A;   None     RIGHT HEART CATH AND CORONARY ANGIOGRAPHY N/A 08/02/2021   Procedure: RIGHT HEART CATH AND CORONARY ANGIOGRAPHY;  Surgeon: Mady Bruckner, MD;  Location: [facility];  Service: Cardiovascular;  Laterality: N/A;   RIGHT/LEFT HEART CATH AND CORONARY ANGIOGRAPHY N/A 12/13/2017   Procedure: RIGHT/LEFT HEART CATH AND CORONARY ANGIOGRAPHY;  Surgeon: Court Dorn PARAS, MD;  Location: [facility];  Service: Cardiovascular;  Laterality: N/A;     Family History  Problem Relation Age of Onset   Heart attack Mother        Died age 65   Heart attack Brother        97    Social History   Socioeconomic History   Marital status: Divorced    Spouse name: Not on file   Number of children: 4   Years of education: Not on file   Highest education level: High school graduate  Occupational History   Occupation: disability  Tobacco Use   Smoking status: Every Day    Current packs/day: 0.50    Types: Cigarettes   Smokeless tobacco: Never  Vaping Use   Vaping status: Never Used  Substance and Sexual Activity   Alcohol use: Yes    Comment: occasionally   Drug use: Yes  Types: Cocaine   Sexual activity: Not on file  Other Topics Concern   Not on file  Social History Narrative   ** Merged History Encounter **       Lives with fiance.     Social Drivers of Health   Tobacco Use: High Risk (02/23/2024)   Patient History    Smoking Tobacco Use: Every Day    Smokeless Tobacco Use: Never    Passive Exposure: Not on file  Financial Resource Strain: High Risk (12/25/2023)   Overall Financial Resource Strain (CARDIA)    Difficulty of Paying Living Expenses: Very hard  Food Insecurity: Food Insecurity Present (01/18/2024)   Epic    Worried About Programme Researcher, Broadcasting/film/video in the Last Year: Often true    Ran Out of Food in the Last Year: Often true  Transportation Needs: Unmet Transportation Needs (01/18/2024)   Epic    Lack of  Transportation (Medical): Yes    Lack of Transportation (Non-Medical): Yes  Physical Activity: Not on File (07/29/2021)   Received from [facility]   Physical Activity    Physical Activity: 0  Stress: Not on File (07/29/2021)   Received from [facility]   Stress    Stress: 0  Social Connections: Not on File (12/24/2022)   [facility] from [facility]   Social Connections    Connectedness: 0  Intimate Partner Violence: Not At Risk (01/18/2024)   Epic    Fear of Current or Ex-Partner: No    Emotionally Abused: No    Physically Abused: No    Sexually Abused: No  Depression (PHQ2-9): Low Risk (01/23/2024)   Depression (PHQ2-9)    PHQ-2 Score: 0  Recent Concern: Depression (PHQ2-9) - Medium Risk (01/20/2024)   Depression (PHQ2-9)    PHQ-2 Score: 9  Alcohol Screen: Low Risk (02/25/2022)   Alcohol Screen    Last Alcohol Screening Score (AUDIT): 2  Housing: High Risk (01/08/2024)   Epic    Unable to Pay for Housing in the Last Year: No    Number of Times Moved in the Last Year: Not on file    Homeless in the Last Year: Yes  Utilities: Not At Risk (01/18/2024)   Epic    Threatened with loss of utilities: No  Health Literacy: Not on file   Health Maintenance  Topic Date Due   Hepatitis C Screening  Never done   Pneumococcal Vaccine for age over 18 (1 of 2 - PCV) Never done   Colon Cancer Screening  Never done   Zoster (Shingles) Vaccine (1 of 2) Never done   Flu Shot  11/10/2023   COVID-19 Vaccine (1 - 2025-26 season) Never done   DTaP/Tdap/Td vaccine (2 - Td or Tdap) 12/15/2030   HIV Screening  Completed   Hepatitis B Vaccine  Aged Out   Meningitis B Vaccine  Aged Out    Objective   BP (!) 186/77   Pulse 88   Resp 16   Ht 6' (1.829 m)   Wt 210 lb 6.4 oz (95.4 kg)   SpO2 96%   BMI 28.54 kg/m   There were no vitals taken for this visit. BP Readings from Last 3 Encounters:  04/09/24 (!) 186/77  02/23/24 (!) 144/86  01/29/24 138/72       Physical Exam Vitals reviewed.  HENT:      Head: Normocephalic.     Right Ear: Tympanic membrane and external ear normal.     Left Ear: Tympanic membrane and external ear normal.  Nose: Nose normal.     Mouth/Throat:     Comments: Poor dentition  Eyes:     Extraocular Movements: Extraocular movements intact.     Pupils: Pupils are equal, round, and reactive to light.  Cardiovascular:     Rate and Rhythm: Normal rate and regular rhythm.  Pulmonary:     Effort: Pulmonary effort is normal.     Breath sounds: Normal breath sounds.  Abdominal:     General: Bowel sounds are normal. There is distension.     Palpations: Abdomen is soft.  Musculoskeletal:        General: Normal range of motion.  Skin:    General: Skin is warm and dry.  Neurological:     Mental Status: He is alert and oriented to person, place, and time.  Psychiatric:        Mood and Affect: Mood normal.        Behavior: Behavior normal.        Thought Content: Thought content normal.        Judgment: Judgment normal.        Assessment & Plan:  Allen Mayer was seen today for new patient (initial visit).  Diagnoses and all orders for this visit:  Heart failure with reduced ejection fraction (HCC) -     digoxin  (LANOXIN ) 0.125 MG tablet; Take 1 tablet (0.125 mg total) by mouth daily. -     losartan  (COZAAR ) 25 MG tablet; Take 1 tablet (25 mg total) by mouth daily. -     spironolactone  (ALDACTONE ) 25 MG tablet; Take 1 tablet (25 mg total) by mouth daily. -     torsemide  (DEMADEX ) 20 MG tablet; Take 1 tablet (20 mg total) by mouth daily. Future refills will need to go to cardiologist -     Basic Metabolic Panel -     CBC with Differential  Essential hypertension, benign -     losartan  (COZAAR ) 25 MG tablet; Take 1 tablet (25 mg total) by mouth daily. -     rivaroxaban  (XARELTO ) 20 MG TABS tablet; Take 1 tablet (20 mg total) by mouth daily with supper. -     spironolactone  (ALDACTONE ) 25 MG tablet; Take 1 tablet (25 mg total) by mouth  daily.  Prediabetes -     empagliflozin  (JARDIANCE ) 10 MG TABS tablet; Take 1 tablet (10 mg total) by mouth daily.  Anxiety and depression -     ARIPiprazole  (ABILIFY ) 10 MG tablet; Take 1 tablet (10 mg total) by mouth daily.  Panlobular emphysema (HCC) -     albuterol  (VENTOLIN  HFA) 108 (90 Base) MCG/ACT inhaler; Inhale 1-2 puffs into the lungs every 6 (six) hours as needed for wheezing or shortness of breath.  Other orders -     aspirin  EC 81 MG tablet; Take 1 tablet (81 mg total) by mouth daily. Swallow whole. -     atorvastatin  (LIPITOR ) 80 MG tablet; Take 1 tablet (80 mg total) by mouth daily. -     citalopram  (CELEXA ) 20 MG tablet; Take 1 tablet (20 mg total) by mouth daily. -     gabapentin  (NEURONTIN ) 400 MG capsule; Take 1 capsule (400 mg total) by mouth 2 (two) times daily as needed (nerve pain).      Follow-up:  No follow-ups on file.  The above assessment and management plan was discussed with the patient. The patient verbalized understanding of and has agreed to the management plan. Patient is aware to call the clinic if symptoms fail to improve  or worsen. Patient is aware when to return to the clinic for a follow-up visit. Patient educated on when it is appropriate to go to the emergency department.   Rosaline Bohr, NP-C      [1]  Current Outpatient Medications on File Prior to Visit  Medication Sig Dispense Refill   albuterol  (VENTOLIN  HFA) 108 (90 Base) MCG/ACT inhaler Inhale 1-2 puffs into the lungs every 6 (six) hours as needed for wheezing or shortness of breath. 18 g 0   ARIPiprazole  (ABILIFY ) 10 MG tablet Take 1 tablet (10 mg total) by mouth daily. 90 tablet 0   aspirin  EC 81 MG tablet Take 1 tablet (81 mg total) by mouth daily. Swallow whole. 90 tablet 1   atorvastatin  (LIPITOR ) 80 MG tablet Take 1 tablet (80 mg total) by mouth daily. 90 tablet 1   citalopram  (CELEXA ) 20 MG tablet Take 1 tablet (20 mg total) by mouth daily. 90 tablet 0   digoxin   (LANOXIN ) 0.125 MG tablet Take 1 tablet (0.125 mg total) by mouth daily. 30 tablet 1   empagliflozin  (JARDIANCE ) 10 MG TABS tablet Take 1 tablet (10 mg total) by mouth daily. 90 tablet 1   gabapentin  (NEURONTIN ) 400 MG capsule Take 1 capsule (400 mg total) by mouth daily as needed (nerve pain). (Patient taking differently: Take 400 mg by mouth 2 (two) times daily as needed (nerve pain).) 90 capsule 0   losartan  (COZAAR ) 25 MG tablet Take 1 tablet (25 mg total) by mouth daily. 90 tablet 1   meloxicam  (MOBIC ) 7.5 MG tablet Take 1 tablet (7.5 mg total) by mouth daily. 5 tablet 0   mometasone -formoterol  (DULERA ) 200-5 MCG/ACT AERO Inhale 2 puffs into the lungs 2 (two) times daily.     nicotine  polacrilex (NICORETTE ) 4 MG gum Take 1 each (4 mg total) by mouth as needed for smoking cessation. 150 each 0   rivaroxaban  (XARELTO ) 20 MG TABS tablet Take 1 tablet (20 mg total) by mouth daily with supper. 30 tablet 0   spironolactone  (ALDACTONE ) 25 MG tablet Take 1 tablet (25 mg total) by mouth daily. 90 tablet 1   torsemide  (DEMADEX ) 20 MG tablet Take 1 tablet (20 mg total) by mouth daily. 90 tablet 1   umeclidinium bromide  (INCRUSE ELLIPTA ) 62.5 MCG/ACT AEPB Inhale 1 puff into the lungs daily.     No current facility-administered medications on file prior to visit.  [2]  Allergies Allergen Reactions   Flexeril  [Cyclobenzaprine ] Other (See Comments)    Caused cramping and patient did not like the way it made it feel. Requests this medication to NOT be given to him.   "

## 2024-04-09 NOTE — Telephone Encounter (Signed)
 Rosaline NP with [facility] calling to speak with DOD in regard to patient.   [phone]

## 2024-04-09 NOTE — Telephone Encounter (Signed)
 Attempted to call Rosaline, NP with [facility]. When she answered the phone, she had just gotten in touch with Dr. Kate and stated she didn't need to speak with me. DOD Dr. Kate and he is handling concern regarding patient. Will close encounter.

## 2024-04-10 ENCOUNTER — Ambulatory Visit (INDEPENDENT_AMBULATORY_CARE_PROVIDER_SITE_OTHER): Payer: Self-pay | Admitting: Primary Care

## 2024-04-10 LAB — CBC WITH DIFFERENTIAL/PLATELET
Basophils Absolute: 0 x10E3/uL (ref 0.0–0.2)
Basos: 1 %
EOS (ABSOLUTE): 0.2 x10E3/uL (ref 0.0–0.4)
Eos: 4 %
Hematocrit: 45.3 % (ref 37.5–51.0)
Hemoglobin: 15.1 g/dL (ref 13.0–17.7)
Immature Grans (Abs): 0 x10E3/uL (ref 0.0–0.1)
Immature Granulocytes: 0 %
Lymphocytes Absolute: 1.8 x10E3/uL (ref 0.7–3.1)
Lymphs: 34 %
MCH: 28.9 pg (ref 26.6–33.0)
MCHC: 33.3 g/dL (ref 31.5–35.7)
MCV: 87 fL (ref 79–97)
Monocytes Absolute: 0.5 x10E3/uL (ref 0.1–0.9)
Monocytes: 9 %
Neutrophils Absolute: 2.7 x10E3/uL (ref 1.4–7.0)
Neutrophils: 52 %
Platelets: 230 x10E3/uL (ref 150–450)
RBC: 5.22 x10E6/uL (ref 4.14–5.80)
RDW: 13.7 % (ref 11.6–15.4)
WBC: 5.1 x10E3/uL (ref 3.4–10.8)

## 2024-04-10 LAB — BASIC METABOLIC PANEL WITH GFR
BUN/Creatinine Ratio: 14 (ref 10–24)
BUN: 13 mg/dL (ref 8–27)
CO2: 23 mmol/L (ref 20–29)
Calcium: 9.9 mg/dL (ref 8.6–10.2)
Chloride: 103 mmol/L (ref 96–106)
Creatinine, Ser: 0.94 mg/dL (ref 0.76–1.27)
Glucose: 86 mg/dL (ref 70–99)
Potassium: 4.4 mmol/L (ref 3.5–5.2)
Sodium: 141 mmol/L (ref 134–144)
eGFR: 90 mL/min/1.73

## 2024-04-17 ENCOUNTER — Ambulatory Visit: Attending: Cardiovascular Disease | Admitting: Cardiovascular Disease

## 2024-04-19 ENCOUNTER — Encounter: Payer: Self-pay | Admitting: Cardiovascular Disease

## 2024-05-09 ENCOUNTER — Other Ambulatory Visit (INDEPENDENT_AMBULATORY_CARE_PROVIDER_SITE_OTHER): Payer: Self-pay | Admitting: Primary Care

## 2024-05-09 DIAGNOSIS — F419 Anxiety disorder, unspecified: Secondary | ICD-10-CM

## 2024-05-09 DIAGNOSIS — I5020 Unspecified systolic (congestive) heart failure: Secondary | ICD-10-CM

## 2024-05-09 DIAGNOSIS — J431 Panlobular emphysema: Secondary | ICD-10-CM

## 2024-05-09 NOTE — Telephone Encounter (Signed)
 Will forward to provider

## 2024-05-14 ENCOUNTER — Other Ambulatory Visit (INDEPENDENT_AMBULATORY_CARE_PROVIDER_SITE_OTHER): Payer: Self-pay | Admitting: Primary Care
